# Patient Record
Sex: Male | Born: 1985 | Race: Black or African American | Hispanic: No | Marital: Single | Smoking: Former smoker
Health system: Southern US, Community
[De-identification: ages and names within clinical notes are randomized; demographics above are authoritative.]

## PROBLEM LIST (undated history)

## (undated) DIAGNOSIS — E1110 Type 2 diabetes mellitus with ketoacidosis without coma: Secondary | ICD-10-CM

## (undated) DIAGNOSIS — N179 Acute kidney failure, unspecified: Secondary | ICD-10-CM

## (undated) DIAGNOSIS — E109 Type 1 diabetes mellitus without complications: Secondary | ICD-10-CM

## (undated) HISTORY — PX: NO PAST SURGERIES: SHX2092

---

## 2009-05-22 DIAGNOSIS — N179 Acute kidney failure, unspecified: Secondary | ICD-10-CM

## 2009-05-22 HISTORY — DX: Acute kidney failure, unspecified: N17.9

## 2012-03-10 ENCOUNTER — Inpatient Hospital Stay (HOSPITAL_COMMUNITY): Payer: Self-pay

## 2012-03-10 ENCOUNTER — Encounter (HOSPITAL_COMMUNITY): Payer: Self-pay | Admitting: Nurse Practitioner

## 2012-03-10 ENCOUNTER — Inpatient Hospital Stay (HOSPITAL_COMMUNITY)
Admission: EM | Admit: 2012-03-10 | Discharge: 2012-03-22 | DRG: 638 | Disposition: A | Discharge status: Home / Self Care | Payer: Self-pay | Attending: Internal Medicine | Admitting: Internal Medicine

## 2012-03-10 DIAGNOSIS — R000 Tachycardia, unspecified: Secondary | ICD-10-CM

## 2012-03-10 DIAGNOSIS — E1159 Type 2 diabetes mellitus with other circulatory complications: Secondary | ICD-10-CM | POA: Diagnosis present

## 2012-03-10 DIAGNOSIS — E781 Pure hyperglyceridemia: Secondary | ICD-10-CM | POA: Diagnosis present

## 2012-03-10 DIAGNOSIS — I152 Hypertension secondary to endocrine disorders: Secondary | ICD-10-CM | POA: Diagnosis present

## 2012-03-10 DIAGNOSIS — R5383 Other fatigue: Secondary | ICD-10-CM

## 2012-03-10 DIAGNOSIS — G9341 Metabolic encephalopathy: Secondary | ICD-10-CM

## 2012-03-10 DIAGNOSIS — I158 Other secondary hypertension: Secondary | ICD-10-CM | POA: Diagnosis present

## 2012-03-10 DIAGNOSIS — Z79899 Other long term (current) drug therapy: Secondary | ICD-10-CM

## 2012-03-10 DIAGNOSIS — F05 Delirium due to known physiological condition: Secondary | ICD-10-CM | POA: Diagnosis present

## 2012-03-10 DIAGNOSIS — F17200 Nicotine dependence, unspecified, uncomplicated: Secondary | ICD-10-CM | POA: Diagnosis present

## 2012-03-10 DIAGNOSIS — E1310 Other specified diabetes mellitus with ketoacidosis without coma: Principal | ICD-10-CM | POA: Diagnosis present

## 2012-03-10 DIAGNOSIS — I498 Other specified cardiac arrhythmias: Secondary | ICD-10-CM | POA: Diagnosis present

## 2012-03-10 DIAGNOSIS — E1165 Type 2 diabetes mellitus with hyperglycemia: Secondary | ICD-10-CM | POA: Diagnosis present

## 2012-03-10 DIAGNOSIS — D509 Iron deficiency anemia, unspecified: Secondary | ICD-10-CM | POA: Diagnosis present

## 2012-03-10 DIAGNOSIS — E1169 Type 2 diabetes mellitus with other specified complication: Secondary | ICD-10-CM

## 2012-03-10 DIAGNOSIS — IMO0002 Reserved for concepts with insufficient information to code with codable children: Secondary | ICD-10-CM | POA: Diagnosis present

## 2012-03-10 DIAGNOSIS — N179 Acute kidney failure, unspecified: Secondary | ICD-10-CM | POA: Diagnosis present

## 2012-03-10 DIAGNOSIS — E119 Type 2 diabetes mellitus without complications: Secondary | ICD-10-CM

## 2012-03-10 DIAGNOSIS — E1110 Type 2 diabetes mellitus with ketoacidosis without coma: Secondary | ICD-10-CM | POA: Diagnosis present

## 2012-03-10 DIAGNOSIS — R609 Edema, unspecified: Secondary | ICD-10-CM | POA: Diagnosis present

## 2012-03-10 DIAGNOSIS — I10 Essential (primary) hypertension: Secondary | ICD-10-CM

## 2012-03-10 LAB — POCT I-STAT, CHEM 8
Calcium, Ion: 1.31 mmol/L — ABNORMAL HIGH (ref 1.12–1.23)
Glucose, Bld: 336 mg/dL — ABNORMAL HIGH (ref 70–99)
HCT: 50 % (ref 39.0–52.0)
Hemoglobin: 17 g/dL (ref 13.0–17.0)
TCO2: 8 mmol/L (ref 0–100)

## 2012-03-10 LAB — BASIC METABOLIC PANEL
BUN: 12 mg/dL (ref 6–23)
CO2: <7 mEq/L — CL (ref 19–32)
Calcium: 9.3 mg/dL (ref 8.4–10.5)
Calcium: 8.4 mg/dL (ref 8.4–10.5)
Creatinine, Ser: 0.9 mg/dL (ref 0.50–1.35)
Creatinine, Ser: 1.1 mg/dL (ref 0.50–1.35)
GFR calc Af Amer: >90 mL/min (ref >90)
GFR calc non Af Amer: >90 mL/min (ref >90)
Glucose, Bld: 325 mg/dL — ABNORMAL HIGH (ref 70–99)
Glucose, Bld: 431 mg/dL — ABNORMAL HIGH (ref 70–99)
Potassium: 4.5 mEq/L (ref 3.5–5.1)

## 2012-03-10 LAB — CBC
HCT: 32.1 % — ABNORMAL LOW (ref 39.0–52.0)
Hemoglobin: 11.7 g/dL — ABNORMAL LOW (ref 13.0–17.0)
MCH: 31.4 pg (ref 26.0–34.0)
MCH: 31.3 pg (ref 26.0–34.0)
MCHC: 36.4 g/dL — ABNORMAL HIGH (ref 30.0–36.0)
MCHC: 34.7 g/dL (ref 30.0–36.0)
Platelets: 214 10*3/uL (ref 150–400)
RDW: 13 % (ref 11.5–15.5)

## 2012-03-10 LAB — GLUCOSE, CAPILLARY
Glucose-Capillary: 273 mg/dL — ABNORMAL HIGH (ref 70–99)
Glucose-Capillary: 361 mg/dL — ABNORMAL HIGH (ref 70–99)

## 2012-03-10 MED ORDER — DEXTROSE-NACL 5-0.45 % IV SOLN: INTRAVENOUS | Status: DC

## 2012-03-10 MED ORDER — ENOXAPARIN SODIUM 40 MG/0.4ML ~~LOC~~ SOLN
40.0000 mg | Freq: Once | SUBCUTANEOUS | Status: DC
Start: 2012-03-10 — End: 2012-03-10

## 2012-03-10 MED ORDER — SODIUM CHLORIDE 0.9 % IV SOLN: INTRAVENOUS | Status: DC

## 2012-03-10 MED ORDER — SODIUM CHLORIDE 0.9 % IV SOLN
INTRAVENOUS | Status: DC
  Administered 2012-03-10: 21:00:00 via INTRAVENOUS

## 2012-03-10 MED ORDER — SODIUM CHLORIDE 0.9 % IV SOLN
INTRAVENOUS | Status: DC
  Administered 2012-03-10: 3.1 [IU]/h via INTRAVENOUS
  Filled 2012-03-10 (×2): qty 1

## 2012-03-10 MED ORDER — ENOXAPARIN SODIUM 40 MG/0.4ML ~~LOC~~ SOLN
40.0000 mg | SUBCUTANEOUS | Status: DC
  Administered 2012-03-10: 40 mg via SUBCUTANEOUS
  Filled 2012-03-10 (×2): qty 0.4

## 2012-03-10 MED ORDER — DEXTROSE 50 % IV SOLN: 25.0000 mL | INTRAVENOUS | Status: DC | PRN

## 2012-03-10 MED ORDER — POTASSIUM CHLORIDE 10 MEQ/100ML IV SOLN
10.0000 meq | INTRAVENOUS | Status: DC
  Administered 2012-03-10: 10 meq via INTRAVENOUS
  Filled 2012-03-10: qty 100

## 2012-03-10 MED ORDER — SODIUM CHLORIDE 0.9 % IV BOLUS (SEPSIS)
1000.0000 mL | Freq: Once | INTRAVENOUS | Status: AC
  Administered 2012-03-10: 1000 mL via INTRAVENOUS

## 2012-03-10 MED ORDER — SODIUM CHLORIDE 0.9 % IV SOLN
INTRAVENOUS | Status: DC
  Filled 2012-03-10: qty 1

## 2012-03-10 NOTE — ED Notes (Signed)
C/o feeling weakness, fatigue, headache x 3 days. States "im extremely dehydrated." pt is diabetic and does not check blood sugar at home, states he has been very thirsty

## 2012-03-10 NOTE — ED Notes (Signed)
Dr Onalee Hua contacted related to potassium d/t unclear parimeters IV K+  D/'d  60 ml infused. No new orders obtained Felipa Eth receiving RN notified.

## 2012-03-10 NOTE — ED Notes (Signed)
Attempted to call report to [facility]00 who is unable to take report at this time.

## 2012-03-10 NOTE — ED Notes (Signed)
Pt knows that urine is needed 

## 2012-03-10 NOTE — ED Notes (Signed)
Second call to 3300 Valarie RN unable to take report.

## 2012-03-10 NOTE — ED Provider Notes (Addendum)
History     CSN: [id number]  Arrival date & time 03/10/12  1213   First MD Initiated Contact with Patient 03/10/12 1727      Chief Complaint  Patient presents with  . Fatigue    (Consider location/radiation/quality/duration/timing/severity/associated sxs/prior treatment) The history is provided by the patient and a parent.   26 year old, male, with a history of diabetes.  Presents to emergency department complaining of polyuria, polydipsia, and fatigue.  He also has a headache.  He denies pain anywhere else.  He has not had vomiting, vision changes, cough, shortness of breath, or fever.  He takes metformin for his diabetes.  He denies missing any of his medications.  Level V caveat applies for urgent need for intervention  Past Medical History  Diagnosis Date  . Diabetes mellitus without complication     History reviewed. No pertinent past surgical history.  History reviewed. No pertinent family history.  History  Substance Use Topics  . Smoking status: Current Some Day Smoker  . Smokeless tobacco: Not on file  . Alcohol Use: Yes      Review of Systems  Constitutional: Negative for fever, chills and diaphoresis.  Eyes: Negative for visual disturbance.  Respiratory: Negative for cough and shortness of breath.   Cardiovascular: Negative for chest pain.  Gastrointestinal: Negative for nausea, vomiting and abdominal pain.  Genitourinary: Positive for frequency. Negative for dysuria.  Musculoskeletal: Negative for back pain.  Neurological: Positive for weakness and headaches.  Psychiatric/Behavioral: Negative for confusion.  All other systems reviewed and are negative.    Allergies  Review of patient's allergies indicates no known allergies.  Home Medications   Current Outpatient Rx  Name Route Sig Dispense Refill  . GLIPIZIDE 5 MG PO TABS Oral Take 5 mg by mouth every 3 (three) days.    Marland Kitchen METFORMIN HCL 1000 MG PO TABS Oral Take 1,000 mg by mouth daily.       BP 174/116  Pulse 107  Temp 97.9 F (36.6 C) (Oral)  Resp 22  SpO2 100%  Physical Exam  Nursing note and vitals reviewed. Constitutional: He is oriented to person, place, and time. No distress.       Listless, gaunt male, tahcypneic  HENT:  Head: Normocephalic and atraumatic.       Dry oral mucosa  Eyes: Conjunctivae normal and EOM are normal.  Neck: Normal range of motion. Neck supple.  Cardiovascular: Regular rhythm and intact distal pulses.   No murmur heard.      tachycardia  Pulmonary/Chest: Breath sounds normal. No respiratory distress. He has no rales.       tachypnea  Abdominal: Soft. Bowel sounds are normal. He exhibits no distension. There is no tenderness.  Musculoskeletal: Normal range of motion. He exhibits no edema.  Neurological: He is alert and oriented to person, place, and time. No cranial nerve deficit.  Skin: Skin is warm and dry.  Psychiatric: He has a normal mood and affect. Judgment and thought content normal.    ED Course  Procedures (including critical care time) DKA   Labs Reviewed  GLUCOSE, CAPILLARY - Abnormal; Notable for the following:    Glucose-Capillary 273 (*)     All other components within normal limits  CBC - Abnormal; Notable for the following:    WBC 15.4 (*)     RBC 3.73 (*)     Hemoglobin 11.7 (*)     HCT 32.1 (*)     MCHC 36.4 (*)  CORRECTED FOR LIPEMIA  All other components within normal limits  BASIC METABOLIC PANEL - Abnormal; Notable for the following:    Sodium 130 (*)  CORRECTED FOR LIPEMIA   Chloride 94 (*)  CORRECTED FOR LIPEMIA   CO2 <7 (*)     Glucose, Bld 325 (*)  CORRECTED FOR LIPEMIA   All other components within normal limits  POCT I-STAT, CHEM 8 - Abnormal; Notable for the following:    Sodium 133 (*)     Chloride 114 (*)     Glucose, Bld 336 (*)     Calcium, Ion 1.31 (*)     All other components within normal limits  GLUCOSE, CAPILLARY - Abnormal; Notable for the following:    Glucose-Capillary  314 (*)     All other components within normal limits  URINALYSIS, ROUTINE W REFLEX MICROSCOPIC   No results found.   No diagnosis found.  7:54 PM Spoke with Dr. Onalee Hua. She will admit to stepdown for further tx.   MDM  DKA Hyperglycemia Tachycardia         Cheri Guppy, MD 03/10/12 1759  Cheri Guppy, MD 03/10/12 [phone]

## 2012-03-10 NOTE — ED Notes (Signed)
Per mom, pt c/o ha and fatigue yesterday afternoon that progressed to next day when pt couldn't get out of bed. Pt ambulated to ED, but was really fatigued, unsteady on feet, weak, no appetite. Pt denies pain. Pt A&O x4, sob, labored breathing at rest.

## 2012-03-10 NOTE — H&P (Addendum)
Chief Complaint:  fatigue  HPI: 26 yo male niddm comes in with cc of fatigue.  Pt is very confused and per RN in ED as been confused since arrival to ED.  Pt found to be in dka with significant acidosis co2 less than 7.  Pt denies any cp, fevers, n/v/d.  Denies sob however he is tachypneic mildly and tachycardic during my visit.  He denies any h/o being on insulin at home in the past.  Denies any other h/o hiv or other chronic medical issues.  He is mildly confused during my interview and repeatively answers "type II diabetes" to multiple questions and has to be redirected.  He appears to be in no pain.  He says he has been losing weight but doesn't know how much.  Denies any symptoms except been feeling very tired.  He is from [place] and is new to the area and lives with his sister.  Review of Systems:  O/w neg but unreliable due to pt ams  Past Medical History: Past Medical History  Diagnosis Date  . Diabetes mellitus without complication    History reviewed. No pertinent past surgical history.  Medications: Prior to Admission medications   Medication Sig Start Date End Date Taking? Authorizing Provider  glipiZIDE (GLUCOTROL) 5 MG tablet Take 5 mg by mouth every 3 (three) days.   Yes Historical Provider, MD  metFORMIN (GLUCOPHAGE) 1000 MG tablet Take 1,000 mg by mouth daily.   Yes Historical Provider, MD    Allergies:  No Known Allergies  Social History:  reports that he has been smoking.  He does not have any smokeless tobacco history on file. He reports that he drinks alcohol. He reports that he does not use illicit drugs.  Family History: History reviewed. No pertinent family history.  Physical Exam: Filed Vitals:   03/10/12 1614 03/10/12 1815 03/10/12 1820 03/10/12 1847  BP: 174/116 157/115  166/110  Pulse: 107 133    Temp:   97.4 F (36.3 C)   TempSrc:   Oral   Resp: 22 26  26   SpO2: 100% 99%  100%   General appearance: alert, cooperative, mild distress and  slowed mentation mildly confused oriented to person, place not to time overall dehydrated clinically and mildly cachectic Neck: no adenopathy, no carotid bruit, no JVD, supple, symmetrical, trachea midline and thyroid not enlarged, symmetric, no tenderness/mass/nodules Lungs: clear to auscultation bilaterally Chest wall: no tenderness Heart: regular rate and rhythm, S1, S2 normal, no S3 or S4, no click and no rub tachycardic Abdomen: soft, non-tender; bowel sounds normal; no masses,  no organomegaly Extremities: extremities normal, atraumatic, no cyanosis or edema Pulses: 2+ and symmetric Skin: Skin color, texture, turgor normal. No rashes or lesions Neurologic: cn 2 thru 12 intact.  5/5 strenght bue and ble. No rashes.  Confused.    Labs on Admission:   [facility] 03/10/12 1450 03/10/12 1330  NA 133* 130*  K 4.8 4.5  CL 114* 94*  CO2 -- <7*  GLUCOSE 336* 325*  BUN 13 12  CREATININE 1.00 0.90  CALCIUM -- 9.3  MG -- --  PHOS -- --   20/13 1330  WBC -- 15.4*  NEUTROABS -- --  HGB 17.0 11.7*  HCT 50.0 32.1*  MCV -- 86.1  PLT -- 238    Radiological Exams on Admission: No results found.  Assessment/Plan Present on Admission:  26 yo male h/o niddm here with ?new onset dka .DKA (diabetic ketoacidoses) .Metabolic encephalopathy .Diabetes mellitus without complication .High  blood pressure associated with diabetes .Fatigue  No previous records.  Pt encephalopathic may be due alone to his significant acidosis but will r/o causes of dka such as checking a cxr, urinalysis to r/o infection.  Ck stat ct head.  Also ck hiv status, along with uds and etoh levels.  Ck lft's and lipase.  Place on dka pathway with insulin gtt without a bolus (glucose around 300 and probably will be sensitive to insulin) and ivf.  Ck ekg and initial troponin.  Place in stepdown.  Full code.   Dwana Garin A [phone] 03/10/2012, 8:13 PM                                                                                              12 lead ekg reviewed sinus tachycardia with no acute changes.

## 2012-03-11 ENCOUNTER — Inpatient Hospital Stay (HOSPITAL_COMMUNITY): Payer: Self-pay

## 2012-03-11 DIAGNOSIS — G9341 Metabolic encephalopathy: Secondary | ICD-10-CM

## 2012-03-11 DIAGNOSIS — E1110 Type 2 diabetes mellitus with ketoacidosis without coma: Secondary | ICD-10-CM

## 2012-03-11 LAB — BASIC METABOLIC PANEL
BUN: 19 mg/dL (ref 6–23)
BUN: 21 mg/dL (ref 6–23)
BUN: 24 mg/dL — ABNORMAL HIGH (ref 6–23)
BUN: 26 mg/dL — ABNORMAL HIGH (ref 6–23)
BUN: 28 mg/dL — ABNORMAL HIGH (ref 6–23)
BUN: 33 mg/dL — ABNORMAL HIGH (ref 6–23)
BUN: 37 mg/dL — ABNORMAL HIGH (ref 6–23)
CO2: <7 mEq/L — CL (ref 19–32)
CO2: <7 mEq/L — CL (ref 19–32)
CO2: <7 mEq/L — CL (ref 19–32)
CO2: <7 mEq/L — CL (ref 19–32)
Calcium: 8.3 mg/dL — ABNORMAL LOW (ref 8.4–10.5)
Calcium: 8.1 mg/dL — ABNORMAL LOW (ref 8.4–10.5)
Calcium: 7.6 mg/dL — ABNORMAL LOW (ref 8.4–10.5)
Calcium: 7.9 mg/dL — ABNORMAL LOW (ref 8.4–10.5)
Chloride: 108 mEq/L (ref 96–112)
Chloride: 110 mEq/L (ref 96–112)
Chloride: 111 mEq/L (ref 96–112)
Chloride: 111 mEq/L (ref 96–112)
Creatinine, Ser: 1.15 mg/dL (ref 0.50–1.35)
Creatinine, Ser: 1.09 mg/dL (ref 0.50–1.35)
Creatinine, Ser: 1.32 mg/dL (ref 0.50–1.35)
Creatinine, Ser: 1.38 mg/dL — ABNORMAL HIGH (ref 0.50–1.35)
Creatinine, Ser: 1.41 mg/dL — ABNORMAL HIGH (ref 0.50–1.35)
Creatinine, Ser: 2.11 mg/dL — ABNORMAL HIGH (ref 0.50–1.35)
Creatinine, Ser: 2.6 mg/dL — ABNORMAL HIGH (ref 0.50–1.35)
GFR calc non Af Amer: 85 mL/min — ABNORMAL LOW (ref >90)
GFR calc non Af Amer: 67 mL/min — ABNORMAL LOW (ref >90)
Glucose, Bld: 358 mg/dL — ABNORMAL HIGH (ref 70–99)
Glucose, Bld: 302 mg/dL — ABNORMAL HIGH (ref 70–99)
Glucose, Bld: 256 mg/dL — ABNORMAL HIGH (ref 70–99)
Glucose, Bld: 191 mg/dL — ABNORMAL HIGH (ref 70–99)
Glucose, Bld: 219 mg/dL — ABNORMAL HIGH (ref 70–99)
Glucose, Bld: 234 mg/dL — ABNORMAL HIGH (ref 70–99)
Glucose, Bld: 177 mg/dL — ABNORMAL HIGH (ref 70–99)
Potassium: 4.1 mEq/L (ref 3.5–5.1)
Potassium: 3.7 mEq/L (ref 3.5–5.1)
Potassium: 3.5 mEq/L (ref 3.5–5.1)

## 2012-03-11 LAB — POCT I-STAT 3, ART BLOOD GAS (G3+)
Bicarbonate: 1.9 mEq/L — ABNORMAL LOW (ref 20.0–24.0)
TCO2: <5 mmol/L (ref 0–100)
TCO2: <5 mmol/L (ref 0–100)
pCO2 arterial: 15.3 mmHg — CL (ref 35.0–45.0)
pH, Arterial: 6.845 — ABNORMAL LOW (ref 7.350–7.400)
pH, Arterial: 7.013 — CL (ref 7.350–7.450)
pO2, Arterial: 141 mmHg — ABNORMAL HIGH (ref 80.0–100.0)
pO2, Arterial: 192 mmHg — ABNORMAL HIGH (ref 80.0–100.0)

## 2012-03-11 LAB — GLUCOSE, CAPILLARY
Glucose-Capillary: 335 mg/dL — ABNORMAL HIGH (ref 70–99)
Glucose-Capillary: 274 mg/dL — ABNORMAL HIGH (ref 70–99)
Glucose-Capillary: 171 mg/dL — ABNORMAL HIGH (ref 70–99)
Glucose-Capillary: 185 mg/dL — ABNORMAL HIGH (ref 70–99)
Glucose-Capillary: 212 mg/dL — ABNORMAL HIGH (ref 70–99)
Glucose-Capillary: 211 mg/dL — ABNORMAL HIGH (ref 70–99)
Glucose-Capillary: 153 mg/dL — ABNORMAL HIGH (ref 70–99)
Glucose-Capillary: 179 mg/dL — ABNORMAL HIGH (ref 70–99)

## 2012-03-11 LAB — CBC
HCT: 31 % — ABNORMAL LOW (ref 39.0–52.0)
Hemoglobin: 10.9 g/dL — ABNORMAL LOW (ref 13.0–17.0)
MCHC: 35.2 g/dL (ref 30.0–36.0)

## 2012-03-11 LAB — URINALYSIS, ROUTINE W REFLEX MICROSCOPIC
Bilirubin Urine: NEGATIVE
Glucose, UA: >1000 mg/dL — AB
Ketones, ur: >80 mg/dL — AB
Protein, ur: 100 mg/dL — AB
Urobilinogen, UA: 0.2 mg/dL (ref 0.0–1.0)

## 2012-03-11 LAB — URINE MICROSCOPIC-ADD ON

## 2012-03-11 LAB — HIV ANTIBODY (ROUTINE TESTING W REFLEX): HIV: NONREACTIVE

## 2012-03-11 LAB — RAPID URINE DRUG SCREEN, HOSP PERFORMED: Opiates: NOT DETECTED

## 2012-03-11 LAB — MRSA PCR SCREENING: MRSA by PCR: NEGATIVE

## 2012-03-11 MED ORDER — DEXTROSE 50 % IV SOLN
25.0000 mL | INTRAVENOUS | Status: DC | PRN
  Administered 2012-03-12: 25 mL via INTRAVENOUS
  Filled 2012-03-11: qty 50

## 2012-03-11 MED ORDER — SODIUM CHLORIDE 0.9 % IV SOLN: INTRAVENOUS | Status: DC

## 2012-03-11 MED ORDER — CHLORHEXIDINE GLUCONATE 0.12 % MT SOLN
15.0000 mL | Freq: Two times a day (BID) | OROMUCOSAL | Status: DC
  Administered 2012-03-11 – 2012-03-13 (×4): 15 mL via OROMUCOSAL
  Filled 2012-03-11 (×7): qty 15

## 2012-03-11 MED ORDER — SODIUM CHLORIDE 0.9 % IV BOLUS (SEPSIS)
500.0000 mL | Freq: Once | INTRAVENOUS | Status: AC
  Administered 2012-03-11: 08:00:00 via INTRAVENOUS

## 2012-03-11 MED ORDER — SODIUM BICARBONATE 8.4 % IV SOLN
INTRAVENOUS | Status: AC
  Administered 2012-03-11: 100 meq via INTRAVENOUS
  Filled 2012-03-11: qty 100

## 2012-03-11 MED ORDER — ENOXAPARIN SODIUM 40 MG/0.4ML ~~LOC~~ SOLN
40.0000 mg | Freq: Every day | SUBCUTANEOUS | Status: DC
  Administered 2012-03-11 – 2012-03-12 (×2): 40 mg via SUBCUTANEOUS
  Filled 2012-03-11 (×3): qty 0.4

## 2012-03-11 MED ORDER — SODIUM CHLORIDE 0.9 % IV SOLN
250.0000 mg | Freq: Four times a day (QID) | INTRAVENOUS | Status: DC
  Administered 2012-03-11 – 2012-03-12 (×5): 250 mg via INTRAVENOUS
  Filled 2012-03-11 (×6): qty 250

## 2012-03-11 MED ORDER — POTASSIUM CHLORIDE 10 MEQ/100ML IV SOLN: 10.0000 meq | INTRAVENOUS | Status: AC

## 2012-03-11 MED ORDER — DEXTROSE-NACL 5-0.45 % IV SOLN
INTRAVENOUS | Status: DC
  Administered 2012-03-11 – 2012-03-12 (×2): via INTRAVENOUS

## 2012-03-11 MED ORDER — SODIUM BICARBONATE 8.4 % IV SOLN
100.0000 meq | Freq: Once | INTRAVENOUS | Status: AC
  Administered 2012-03-11: 100 meq via INTRAVENOUS

## 2012-03-11 MED ORDER — SODIUM CHLORIDE 0.9 % IV SOLN
INTRAVENOUS | Status: DC
  Administered 2012-03-11: 08:00:00 via INTRAVENOUS
  Administered 2012-03-12: 2.1 [IU]/h via INTRAVENOUS
  Administered 2012-03-14: 1.9 [IU]/h via INTRAVENOUS
  Filled 2012-03-11 (×4): qty 1

## 2012-03-11 MED ORDER — BIOTENE DRY MOUTH MT LIQD
15.0000 mL | Freq: Two times a day (BID) | OROMUCOSAL | Status: DC
  Administered 2012-03-12: 15 mL via OROMUCOSAL

## 2012-03-11 MED ORDER — POTASSIUM CHLORIDE 10 MEQ/100ML IV SOLN
10.0000 meq | INTRAVENOUS | Status: AC
  Administered 2012-03-11 – 2012-03-12 (×4): 10 meq via INTRAVENOUS
  Filled 2012-03-11 (×4): qty 100

## 2012-03-11 NOTE — Progress Notes (Signed)
ANTIBIOTIC CONSULT NOTE - INITIAL  Pharmacy Consult for Imipenem Indication:  UTI/sepsis/pancreatitis No Known Allergies  Patient Measurements: Height: 5\' 5"  (165.1 cm) Weight: 111 lb 1.8 oz (50.4 kg) IBW/kg (Calculated) : 61.5    Vital Signs: Temp: 98.3 F (36.8 C) (10/21 0738) Temp src: Axillary (10/21 0738) BP: 133/92 mmHg (10/21 0650) Pulse Rate: 130  (10/21 0650) Intake/Output from previous day: 10/20 0701 - 10/21 0700 In: 1800 [I.V.:1800] Out: 460 [Urine:460] Intake/Output from this shift: Total I/O In: 1930.5 [I.V.:1430.5; IV Piggyback:500] Out: 500 [Urine:500]  Labs:  [facility] 03/11/12 0824 03/11/12 0609 03/11/12 0425 03/11/12 0101 03/10/12 2009 03/10/12 1450 03/10/12 1330  WBC -- -- -- 49.1* 20.8* -- 15.4*  HGB -- -- -- 10.9* 12.2* 17.0 --  PLT -- -- -- 213 214 -- 238  LABCREA -- -- -- -- -- -- --  CREATININE 1.38* 1.32 1.09 -- -- -- --   Estimated Creatinine Clearance: 56.8 ml/min (by C-G formula based on Cr of 1.38). No results found for this basename: VANCOTROUGH:2,VANCOPEAK:2,VANCORANDOM:2,GENTTROUGH:2,GENTPEAK:2,GENTRANDOM:2,TOBRATROUGH:2,TOBRAPEAK:2,TOBRARND:2,AMIKACINPEAK:2,AMIKACINTROU:2,AMIKACIN:2, in the last 72 hours   Microbiology: Recent Results (from the past 720 hour(s))  MRSA PCR SCREENING     Status: Normal   Collection Time   03/10/12 11:18 PM      Component Value Range Status Comment   MRSA by PCR NEGATIVE  NEGATIVE Final     Medical History: Past Medical History  Diagnosis Date  . Diabetes mellitus without complication    Assessment: 26yom with Hx type 2 DM on metformin and glipizide PTA.  He was admitted in DKA and started on DKA protocol.  He has bacteria and leukocytes in UA, Ucx pending.  He also has abdominal pain with possible pancreatitis.  Will begin empiric antibiotics. Goal of Therapy:  Infection resolution  Plan:  Imipenem 250mg  IV q6  Marcelino Scot 03/11/2012,10:40 AM

## 2012-03-11 NOTE — Progress Notes (Signed)
Inpatient Diabetes Program Recommendations  AACE/ADA: New Consensus Statement on Inpatient Glycemic Control (2013)  Target Ranges:  Prepandial:   less than 140 mg/dL      Peak postprandial:   less than 180 mg/dL (1-2 hours)      Critically ill patients:  140 - 180 mg/dL   Inpatient Diabetes Program Recommendations HgbA1C: Please order A1C.  Note: According to notes in chart, pt has DM, type 2.  Please order an A1C.  Will continue to follow. Thanks, Orlando Penner, RN, BSN, CCRN Diabetes Coordinator Inpatient Diabetes Program [phone]

## 2012-03-11 NOTE — Significant Event (Signed)
CRITICAL VALUE ALERT  Critical value received: CO2 <7  Date of notification: 03/11/12  Time of notification:1153  Critical value read back: Yes  Nurse who received alert: Ardith Dark, RN  MD notified: Dr Jonni Sanger on unit  Time of first page: 1153   Responding MD: Dr. Delford Field  Time MD responded:1153

## 2012-03-11 NOTE — Progress Notes (Signed)
Pt to TX to ICU, RR avg 30s-40s, very lethargic, barely responsive. NS RN calling report.

## 2012-03-11 NOTE — Progress Notes (Signed)
UR Completed.  Vangie Bicker [phone] 03/11/2012

## 2012-03-11 NOTE — Progress Notes (Signed)
Report called and pt transferred by day-shift to 2112. Will visit upon end of shift to confirm no additional information needed.

## 2012-03-11 NOTE — Progress Notes (Signed)
PCCM agrees pt should be transferred to 2100 for closer evaluation. Report called: receiving RN currently unavailable.

## 2012-03-11 NOTE — Consult Note (Signed)
Name: [name] [name] MRN: [id number] DOB: Oct 21, 1985    LOS: 1  Referring Provider:  [facility] Reason for Referral:  DKA/AMS  PULMONARY / CRITICAL CARE MEDICINE  HPI:  26 yo diabetic -typoe 2 on OHAs   with DKA , admitted 10/20 pm  with profound acidosis and placed on DKA protocol. PCCM called at 0645 to evaluate for poor mental status -head CT neg & extreme acidosis. UDS neg  Will tx to ICU x 24 hrs to stabilize.  Past Medical History  Diagnosis Date  . Diabetes mellitus without complication    History reviewed. No pertinent past surgical history. Prior to Admission medications   Medication Sig Start Date End Date Taking? Authorizing Provider  glipiZIDE (GLUCOTROL) 5 MG tablet Take 5 mg by mouth every 3 (three) days.   Yes Historical Provider, MD  metFORMIN (GLUCOPHAGE) 1000 MG tablet Take 1,000 mg by mouth daily.   Yes Historical Provider, MD   Allergies No Known Allergies  Family History History reviewed. No pertinent family history. Social History  reports that he has been smoking.  He does not have any smokeless tobacco history on file. He reports that he drinks alcohol. He reports that he does not use illicit drugs.  Review Of Systems: unable since poorly responsive    Events Since Admission: 10-21 tx to icu  Current Status: Stable, tachypneic, follows commands, denies pain  Vital Signs: Temp:  [97.4 F (36.3 C)-98.2 F (36.8 C)] 98.2 F (36.8 C) (10/21 0306) Pulse Rate:  [91-143] 130  (10/21 0650) Resp:  [16-33] 25  (10/21 0650) BP: (130-174)/(82-123) 133/92 mmHg (10/21 0650) SpO2:  [95 %-100 %] 100 % (10/21 0650) Weight:  [50.4 kg (111 lb 1.8 oz)] 50.4 kg (111 lb 1.8 oz) (10/20 2310) Ct Head Wo Contrast  03/10/2012  *RADIOLOGY REPORT*  Clinical Data: Headache, weakness, and fatigue.  CT HEAD WITHOUT  CONTRAST  Technique:  Contiguous axial images were obtained from the base of the skull through the vertex without contrast.  Comparison: None.  Findings: There is no acute intracranial hemorrhage, infarction, or mass lesion.  Brain parenchyma appears normal.  There is slight mucosal thickening in the frontal and ethmoid air cells, probably chronic.  IMPRESSION: No acute intracranial abnormality.  Probable chronic changes in the paranasal sinuses.   Original Report Authenticated By: Gwynn Burly, M.D.    Physical Examination: General:  Thin AAM Neuro:  Responds to voice but lethargic, follows 1 step commands HEENT:  No LAN Neck:  No JVD Cardiovascular:  HSR ST Lungs:  CTA  Kussmaul resp  Abdomen:  Soft, non tender Musculoskeletal:  intact Skin:  warm  Principal Problem:  *DKA (diabetic ketoacidoses) Active Problems:  Diabetes mellitus without complication  Metabolic encephalopathy  High blood pressure associated with diabetes  Fatigue   ASSESSMENT AND PLAN   ENDOCRINE  Lab 03/11/12 0621 03/11/12 0529 03/11/12 0429 03/11/12 0326 03/11/12 0221 GLUCAP 253* 265* 270* 274* 305*  A:  DKA/profound metabolic acidosis P:   -per protocol -CBG 171 now on insulin drip, add dextrose to IVFs -Give 500 bolus & keep fluids at 250/h  PULMONARY  Lab 03/11/12 0621  PHART 7.013*  PCO2ART 15.3*  PO2ART 192.0*  HCO3 3.9*  O2SAT 99.0   Ventilator Settings:    ETT:    A:  Tachypnea related to acidosis P:     CARDIOVASCULAR No results found for this basename: TROPONINI:5,LATICACIDVEN:5, O2SATVEN:5,PROBNP:5 in the last 168 hours ECG:   Lines:   A: No acute issue P:    RENAL  Lab 03/11/12 0425 03/11/12 0100 03/10/12 2153 03/10/12 1450 03/10/12 1330  NA 134* 134* 132* 133* 130*  K 4.1 4.7 -- -- --  CL 108 106 102 114* 94*  CO2 <7* <7* <7* -- <7*  BUN 21 19 18 13 12   CREATININE 1.09 1.15 1.10 1.00 0.90  CALCIUM 8.2* 8.3* 8.4 -- 9.3  MG -- -- -- -- --  PHOS -- -- -- --  --   Intake/Output      10/20 0701 - 10/21 0700 10/21 0701 - 10/22 0700   I.V. (mL/kg) 1800 (35.7)    Total Intake(mL/kg) 1800 (35.7)    Urine (mL/kg/hr) 400 (0.3)    Total Output 400    Net +1400          Foley:    A:  No acute issue P:     GASTROINTESTINAL No results found for this basename: AST:5,ALT:5,ALKPHOS:5,BILITOT:5,PROT:5,ALBUMIN:5 in the last 168 hours  A:  ABD distension P:   -flat plate of abd to ro secondary source of acidosis  HEMATOLOGIC  Lab 03/11/12 0101 03/10/12 2009 03/10/12 1450 03/10/12 1330  HGB 10.9* 12.2* 17.0 11.7*  HCT 31.0* 35.2* 50.0 32.1*  PLT 213 214 -- 238  INR -- -- -- --  APTT -- -- -- --   A:  No acute issue P:    INFECTIOUS  Lab 03/11/12 0101 03/10/12 2009 03/10/12 1330  WBC 49.1* 20.8* 15.4*  PROCALCITON -- -- --   Cultures:  Antibiotics:   A:  No obvious source of infection P:  Procalcitonin protocol, if high, start empiric ABX    NEUROLOGIC  A: AMS -improving P:   - neg ct of head  BEST PRACTICE / DISPOSITION Level of Care:  icu Primary Service:  [facility] to PCCM in ICU Consultants:  pccm Code Status:  full Diet:  npo DVT Px:  lmwh GI Px:  ppi Skin Integrity:  intact Social / Family:  none   Care during the described time interval was provided by me and/or other providers on the critical care team.  I have reviewed this patient's available data, including medical history, events of note, physical examination and test results as part of my evaluation  CC time x  45 m  Cyril Mourning MD. Tonny Bollman. [facility] Pager [phone] If no response call [phone]

## 2012-03-11 NOTE — Progress Notes (Signed)
Due to pt's obtunded state (peaking at around 0600), HR in 140s sustained (as high as 160s), RR in 30s (Kussmaul) critical ABG values, and in-general poor appearance, Attending and Rapid Response paged. Attending NP and Rapid Response Nurse currently at bedside assessing pt and possible need to transfer to ICU. Currently awaiting PCCM to come to beside and assess.

## 2012-03-11 NOTE — Progress Notes (Signed)
Called around [phone] for patient with increased HR and increased lethargy. Upon assessment patient is not oriented and lethargic, HR 140s, BP 13692, RR 33, 99% RA. ABG ph 7.013, CO2 15.3, O2 192 bicarb 3.9. Patient had been agitated earlier to the point of climbing out of the bed, but is now only able to answer simple questions. Admitting NP notified, orders received for 100 meq bicarb given. CCM NP at bedside, orders received to transfer to ICU. Will continue to monitor.

## 2012-03-11 NOTE — Progress Notes (Signed)
Notified Attending ([facility]) On-Call NP regarding pt's sustained HR 120s-140s. Also mentioned to Attending pt's confused and agitated state along with recurrent efforts to dislodge lines and get out of bed. EKG and restraint orders received. EKG not indicative of any acute change. Will continue to monitor.

## 2012-03-12 ENCOUNTER — Inpatient Hospital Stay (HOSPITAL_COMMUNITY): Payer: MEDICAID

## 2012-03-12 DIAGNOSIS — G9341 Metabolic encephalopathy: Secondary | ICD-10-CM

## 2012-03-12 DIAGNOSIS — E1110 Type 2 diabetes mellitus with ketoacidosis without coma: Secondary | ICD-10-CM

## 2012-03-12 LAB — BASIC METABOLIC PANEL
BUN: 38 mg/dL — ABNORMAL HIGH (ref 6–23)
CO2: <7 mEq/L — CL (ref 19–32)
Calcium: 8.6 mg/dL (ref 8.4–10.5)
Chloride: 113 mEq/L — ABNORMAL HIGH (ref 96–112)
Chloride: 113 mEq/L — ABNORMAL HIGH (ref 96–112)
Creatinine, Ser: 3.12 mg/dL — ABNORMAL HIGH (ref 0.50–1.35)
GFR calc Af Amer: 23 mL/min — ABNORMAL LOW (ref >90)
GFR calc Af Amer: 20 mL/min — ABNORMAL LOW (ref >90)
GFR calc Af Amer: 17 mL/min — ABNORMAL LOW (ref >90)
GFR calc non Af Amer: 20 mL/min — ABNORMAL LOW (ref >90)
GFR calc non Af Amer: 18 mL/min — ABNORMAL LOW (ref >90)
Glucose, Bld: 197 mg/dL — ABNORMAL HIGH (ref 70–99)
Glucose, Bld: 180 mg/dL — ABNORMAL HIGH (ref 70–99)
Potassium: 3.4 mEq/L — ABNORMAL LOW (ref 3.5–5.1)
Potassium: 3.3 mEq/L — ABNORMAL LOW (ref 3.5–5.1)
Potassium: 3.9 mEq/L (ref 3.5–5.1)
Sodium: 141 mEq/L (ref 135–145)
Sodium: 136 mEq/L (ref 135–145)
Sodium: 137 mEq/L (ref 135–145)

## 2012-03-12 LAB — LIPASE, BLOOD: Lipase: 204 U/L — ABNORMAL HIGH (ref 11–59)

## 2012-03-12 LAB — URINE CULTURE: Colony Count: 25000

## 2012-03-12 LAB — COMPREHENSIVE METABOLIC PANEL
ALT: 35 U/L (ref 0–53)
Alkaline Phosphatase: 77 U/L (ref 39–117)
BUN: 41 mg/dL — ABNORMAL HIGH (ref 6–23)
CO2: 8 mEq/L — CL (ref 19–32)
Calcium: 8.4 mg/dL (ref 8.4–10.5)
GFR calc Af Amer: 26 mL/min — ABNORMAL LOW (ref >90)
GFR calc non Af Amer: 22 mL/min — ABNORMAL LOW (ref >90)
Glucose, Bld: 126 mg/dL — ABNORMAL HIGH (ref 70–99)
Sodium: 140 mEq/L (ref 135–145)

## 2012-03-12 LAB — GLUCOSE, CAPILLARY
Glucose-Capillary: 169 mg/dL — ABNORMAL HIGH (ref 70–99)
Glucose-Capillary: 148 mg/dL — ABNORMAL HIGH (ref 70–99)
Glucose-Capillary: 171 mg/dL — ABNORMAL HIGH (ref 70–99)

## 2012-03-12 LAB — HEMOGLOBIN A1C
Hgb A1c MFr Bld: 13.9 % — ABNORMAL HIGH (ref <5.7)
Mean Plasma Glucose: 352 mg/dL — ABNORMAL HIGH (ref <117)

## 2012-03-12 LAB — CBC
Hemoglobin: 8.7 g/dL — ABNORMAL LOW (ref 13.0–17.0)
MCHC: 36.6 g/dL — ABNORMAL HIGH (ref 30.0–36.0)
RDW: 14.2 % (ref 11.5–15.5)

## 2012-03-12 MED ORDER — WHITE PETROLATUM GEL
Status: AC
  Administered 2012-03-12: 0.2
  Filled 2012-03-12: qty 5

## 2012-03-12 MED ORDER — LIVING WELL WITH DIABETES BOOK
Freq: Once | Status: AC
  Administered 2012-03-12: 18:00:00
  Filled 2012-03-12: qty 1

## 2012-03-12 MED ORDER — PNEUMOCOCCAL VAC POLYVALENT 25 MCG/0.5ML IJ INJ
0.5000 mL | INJECTION | INTRAMUSCULAR | Status: AC
  Filled 2012-03-12: qty 0.5

## 2012-03-12 MED ORDER — POTASSIUM CHLORIDE 10 MEQ/100ML IV SOLN
INTRAVENOUS | Status: AC
  Administered 2012-03-12: 10 meq via INTRAVENOUS
  Filled 2012-03-12: qty 100

## 2012-03-12 MED ORDER — INFLUENZA VIRUS VACC SPLIT PF IM SUSP
0.5000 mL | INTRAMUSCULAR | Status: AC
  Filled 2012-03-12: qty 0.5

## 2012-03-12 MED ORDER — ENOXAPARIN SODIUM 30 MG/0.3ML ~~LOC~~ SOLN
30.0000 mg | Freq: Every day | SUBCUTANEOUS | Status: DC
  Administered 2012-03-16 – 2012-03-22 (×6): 30 mg via SUBCUTANEOUS
  Filled 2012-03-12 (×10): qty 0.3

## 2012-03-12 MED ORDER — POTASSIUM CHLORIDE 10 MEQ/100ML IV SOLN
10.0000 meq | INTRAVENOUS | Status: AC
  Administered 2012-03-12 (×4): 10 meq via INTRAVENOUS
  Filled 2012-03-12: qty 200
  Filled 2012-03-12: qty 100

## 2012-03-12 NOTE — Progress Notes (Signed)
Inpatient Diabetes Program Recommendations  AACE/ADA: New Consensus Statement on Inpatient Glycemic Control (2013)  Target Ranges:  Prepandial:   less than 140 mg/dL      Peak postprandial:   less than 180 mg/dL (1-2 hours)      Critically ill patients:  140 - 180 mg/dL     Note: Went to talk with the patient about history of diabetes and medication regimen at home.  However, when I speak to the patient he will open his eyes and does not engage in conversation with me.  Spoke with the patient's nurse and she states that patient has been this way since being admitted and that his parents can provide more information.  Will go back to patient's room this evening and see if his parents are there.    Thanks, Orlando Penner, RN, BSN, CCRN Diabetes Coordinator Inpatient Diabetes Program [phone]

## 2012-03-12 NOTE — Progress Notes (Signed)
Hypoglycemic Event  CBG: 66  Treatment: 1/2 amp Dextrose  Symptoms: lethargic  Follow-up CBG: Time:0326 CBG Result:148  Possible Reasons for Event: insulin gtt  Comments/MD notified:d50 already ordered per DKA protocol    Nelson Chimes  Remember to initiate Hypoglycemia Order Set & complete

## 2012-03-12 NOTE — Progress Notes (Signed)
Name: [name] [name] MRN: [id number] DOB: [birth year]    LOS: 2  Referring Provider:  [facility] Reason for Referral:  DKA/AMS  PULMONARY / CRITICAL CARE MEDICINE  HPI:  26 yo diabetic -type 2 on OHAs with DKA, admitted 10/20 pm  with profound acidosis and placed on DKA protocol. PCCM called at 0645 to evaluate for poor mental status -head CT neg & extreme acidosis. UDS neg  Will tx to ICU x 24 hrs to stabilize.   Events Since Admission: 10/21- tx to icu 10/21 - NGT out  Current Status: Stable, tachypneic, follows commands, endorses nausea and abd pain  Vital Signs: Temp:  [97.4 F (36.3 C)-100.3 F (37.9 C)] 98.8 F (37.1 C) (10/22 0420) Pulse Rate:  [28-137] 110  (10/22 0600) Resp:  [18-42] 19  (10/22 0600) BP: (119-146)/(72-95) 119/80 mmHg (10/22 0600) SpO2:  [88 %-100 %] 99 % (10/22 0600) Weight:  [109 lb 5.6 oz (49.6 kg)] 109 lb 5.6 oz (49.6 kg) (10/22 0500) Ct Head Wo Contrast  03/10/2012  *RADIOLOGY REPORT*  Clinical Data: Headache, weakness, and fatigue.  CT HEAD WITHOUT CONTRAST  Technique:  Contiguous axial images were obtained from the base of the skull through the vertex without contrast.  Comparison: None.  Findings: There is no acute intracranial hemorrhage, infarction, or mass lesion.  Brain parenchyma appears normal.  There is slight mucosal thickening in the frontal and ethmoid air cells, probably chronic.  IMPRESSION: No acute intracranial abnormality.  Probable chronic changes in the paranasal sinuses.   Original Report Authenticated By: Gwynn Burly, M.D.    Dg Chest Port 1 View  03/11/2012  *RADIOLOGY REPORT*  Clinical Data:   Diabetic ketoacidosis.  PORTABLE CHEST - 1 VIEW  Comparison: None  Findings: The lungs are clear.  No edema, infiltrate or pleural fluid is identified.  Heart size and mediastinal contours are within normal limits.  No bony abnormalities are identified.  IMPRESSION: No active disease.   Original Report Authenticated By: Reola Calkins,  M.D.    Dg Abd Portable 1v  03/11/2012  *RADIOLOGY REPORT*  Clinical Data: Abdominal distention and weakness.  PORTABLE ABDOMEN - 1 VIEW  Comparison: None.  Findings: The stomach is markedly distended with air.  A fair amount of stool is seen in the colon.  No definite small bowel dilatation.  IMPRESSION:  1.  Stomach is markedly distended with air. 2.  Question constipation.   Original Report Authenticated By: Reyes Ivan, M.D.    Physical Examination: General:  Thin AAM. Sleeping in bed Neuro:  Responds to voice but lethargic, follows commands HEENT:  No LAN Neck:  No JVD Cardiovascular: tachycardic Lungs:  CTA  Kussmaul resp  Abdomen:  Soft, tender to palpation diffusely Musculoskeletal:  intact Skin:  warm  Principal Problem:  *DKA (diabetic ketoacidoses) Active Problems:  Diabetes mellitus without complication  Metabolic encephalopathy  High blood pressure associated with diabetes  Fatigue   ASSESSMENT AND PLAN   ENDOCRINE  A:  DKA/profound metabolic acidosis P:   -per protocol -CBG 98-213 now on insulin drip,on D 51/2NS fluids -CO2 is 8 this AM (previously <7). Continue to monitor; continue insulin until CO2 improves -Unclear if another etiology of metabolic acidosis is contributing given persistently low CO2 on insulin. (?Uremia vs. Ingestion?)   PULMONARY  Lab 03/11/12 0621 03/10/12 0836  PHART 7.013* 6.845*  PCO2ART 15.3* 11.2*  PO2ART 192.0* 141.0*  HCO3 3.9* 1.9*  O2SAT 99.0 96.0     A:  Tachypnea related to acidosis  P:   - On room air, protecting airway   CARDIOVASCULAR  Lab 03/11/12 1030  TROPONINI --  LATICACIDVEN 2.0  PROBNP --   ECG:  Sinus tachy Lines: Peripheral  A: No acute issue P:  - Monitor on telemetry   RENAL  Lab 03/12/12 0445 03/12/12 0002 03/11/12 2005 03/11/12 1604 03/11/12 1023  NA 140 141 141 140 142  K 3.8 3.4* -- -- --  CL 114* 110 111 111 112  CO2 8* <7* <7* <7* <7*  BUN 41* 38* 37* 33* 28*  CREATININE  3.47* 3.12* 2.60* 2.11* 1.41*  CALCIUM 8.4 8.6 8.3* 8.1* 7.9*  MG -- -- -- -- --  PHOS -- -- -- -- --   Intake/Output      10/21 0701 - 10/22 0700 10/22 0701 - 10/23 0700   I.V. (mL/kg) 2996.5 (60.4)    IV Piggyback 1300    Total Intake(mL/kg) 4296.5 (86.6)    Urine (mL/kg/hr) 1981 (1.7)    Emesis/NG output 560    Total Output 2541    Net +1755.5          Foley:    A:  ARF P:   - Creat continues to trend up - Continue IVF - Monitor UOP, 1.9L in last 24 hours   GASTROINTESTINAL  Lab 03/12/12 0445  AST 79*  ALT 35  ALKPHOS 77  BILITOT 0.2*  PROT 6.1  ALBUMIN 2.4*   KUB- Stomach distended with air  A:  ABD distension; ?pancreatitis P:   - Lipase elevated to 204, Amylase slightly elevated. Triglycerides >5000 - NGT out yesterday, continues to have nausea. Replace if needed - Ice chips only - Pain control - Abdominal ultrasound now to evaluate liver, pancreas and kidneys   HEMATOLOGIC  Lab 03/12/12 0445 03/11/12 0101 03/10/12 2009 03/10/12 1450 03/10/12 1330  HGB 8.7* 10.9* 12.2* 17.0 11.7*  HCT 23.8* 31.0* 35.2* 50.0 32.1*  PLT 148* 213 214 -- 238  INR -- -- -- -- --  APTT -- -- -- -- --   A:  Anemia. No signs of bleeding P:  - F/u AM cbc - Transfuse only if <7  INFECTIOUS  Lab 03/12/12 0445 03/11/12 0823 03/11/12 0101 03/10/12 2009 03/10/12 1330  WBC 13.5* -- 49.1* 20.8* 15.4*  PROCALCITON -- 4.21 -- -- --   Cultures: Urine Cx 10/21 >>  Antibiotics: Primaxin 10/21>>  A:  No obvious source of infection P:  - Continue Primaxin until ultrasound complete. Consider d/c if u/s wnl   NEUROLOGIC  A: AMS -improving P:   - neg ct of head   BEST PRACTICE / DISPOSITION Level of Care:  ICU Primary Service:  [facility] to PCCM in ICU Consultants:  pccm Code Status:  full Diet:  npo DVT Px:  Lovenox GI Px:  ppi Skin Integrity:  intact Social / Family:  none  Amber M. Hairford, M.D. 03/12/2012 7:34 AM  26 yo with recent dx of DM.  Likely type  1 given polyuria, polydipsia, and weight loss 2nd to acute pancreatitis with hypertriglyceridemia.  Will need to continue insulin and IV fluid.  Abx added for possible infection>>clinical suspicion is low.  If abd u/s unremarkable, then can likely d/c Abx soon.  Will need assistance with arrangements for financial aid.  I have independently reviewed chart, examined pt, and formulated assessment/plan in discussion with residents.  Updated family at bedside.  Critical care time 35 minutes.  Coralyn Helling, MD [facility] Pulmonary/Critical Care 03/12/2012, 2:08 PM Pager:  [phone]  After 3pm call: [phone]

## 2012-03-12 NOTE — Progress Notes (Signed)
Inpatient Diabetes Program Recommendations  AACE/ADA: New Consensus Statement on Inpatient Glycemic Control (2013)  Target Ranges:  Prepandial:   less than 140 mg/dL      Peak postprandial:   less than 180 mg/dL (1-2 hours)      Critically ill patients:  140 - 180 mg/dL     Inpatient Diabetes Program Recommendations HgbA1C: A1C is 13.9%.  Note:  Returned to patient's room to talk with patient and his mother and stepfather.  Patient again opened his eyes when I spoke to him and then shut his eyes back and was not engaged in conversation.  Talked to patient's mother and stepfather.  According the the family, patient was diagnosed with diabetes one month ago at [facility] and was given enough glipizide and Glucophage to last about a week.  Informed the patient and his family that both medications prescribed can be purchased at [facility] for $4 each.  Family states that patient did take the medication but once he ran out he did not have any money to purchase the medications and therefore has not been taking anything for his diabetes since then.  Family also reports that patient continues to eat poor food choices (candies, cookies, and chips).  Talked with the family about blood sugar control and diet modifications.  Will order Living well with diabetes book and request dietician consult.  Plan to visit with the patient again and get patient involved in education. Thanks, Orlando Penner, RN, BSN, CCRN Diabetes Coordinator Inpatient Diabetes Program [phone]

## 2012-03-12 NOTE — Progress Notes (Signed)
INITIAL ADULT NUTRITION ASSESSMENT Date: 03/12/2012   Time: 3:19 PM  Reason for Assessment: (MST=2)  INTERVENTION:  Follow-up for diabetes education at a more appropriate time.  Also recommend OP diabetes diet education.  Recommend start TF via enteral feeding tube placed into the jejunum if unable to begin PO diet in the next 24-48 hours.  DOCUMENTATION CODES Per approved criteria  -Underweight -Severe malnutrition in the context of acute illness   ASSESSMENT: Male 26 y.o.  Dx: DKA (diabetic ketoacidoses)  Hx:  Past Medical History  Diagnosis Date  . Diabetes mellitus without complication     History reviewed. No pertinent past surgical history.  Related Meds:  Scheduled Meds:   . antiseptic oral rinse  15 mL Mouth Rinse q12n4p  . chlorhexidine  15 mL Mouth Rinse BID  . enoxaparin  30 mg Subcutaneous Daily  . influenza  inactive virus vaccine  0.5 mL Intramuscular Tomorrow-1000  . pneumococcal 23 valent vaccine  0.5 mL Intramuscular Tomorrow-1000  . potassium chloride  10 mEq Intravenous Q1 Hr x 4  . potassium chloride  10 mEq Intravenous Q1 Hr x 4  . white petrolatum      . DISCONTD: enoxaparin  40 mg Subcutaneous Daily  . DISCONTD: imipenem-cilastatin  250 mg Intravenous Q6H   Continuous Infusions:   . dextrose 5 % and 0.45% NaCl Stopped (03/12/12 1458)  . insulin (NOVOLIN-R) infusion 3.6 Units/hr (03/12/12 1400)   PRN Meds:.dextrose   Ht: 5\' 5"  (165.1 cm)  Wt: 109 lb 5.6 oz (49.6 kg)  Ideal Wt: 61.8 kg % Ideal Wt: 80%  Usual Wt: 118 lb (~4 weeks ago per patient) % Usual Wt: 92%  Body mass index is 18.20 kg/(m^2).  Underweight  Food/Nutrition Related Hx: 8% weight loss in the past 4 weeks; recent poor PO intake.  Does not follow a specific diet at home.  Labs:  CMP     Component Value Date/Time   NA 137 03/12/2012 1221   K 3.3* 03/12/2012 1221   CL 113* 03/12/2012 1221   CO2 11* 03/12/2012 1221   GLUCOSE 180* 03/12/2012 1221   BUN 44*  03/12/2012 1221   CREATININE 4.23* 03/12/2012 1221   CALCIUM 8.2* 03/12/2012 1221   PROT 6.1 03/12/2012 0445   ALBUMIN 2.4* 03/12/2012 0445   AST 79* 03/12/2012 0445   ALT 35 03/12/2012 0445   ALKPHOS 77 03/12/2012 0445   BILITOT 0.2* 03/12/2012 0445   GFRNONAA 18* 03/12/2012 1221   GFRAA 20* 03/12/2012 1221    CBG (last 3)   Basename 03/12/12 1240 03/12/12 0524 03/12/12 0427  GLUCAP 171* 146* 118*    Sodium  Date/Time Value Range Status  03/12/2012 12:21 PM 137  135 - 145 mEq/L Final     POST-ULTRACENTRIFUGATION  03/12/2012  8:46 AM 136  135 - 145 mEq/L Final     POST-ULTRACENTRIFUGATION  03/12/2012  4:45 AM 140  135 - 145 mEq/L Final     POST-ULTRACENTRIFUGATION    Potassium  Date/Time Value Range Status  03/12/2012 12:21 PM 3.3* 3.5 - 5.1 mEq/L Final     POST-ULTRACENTRIFUGATION  03/12/2012  8:46 AM 3.4* 3.5 - 5.1 mEq/L Final     POST-ULTRACENTRIFUGATION  03/12/2012  4:45 AM 3.8  3.5 - 5.1 mEq/L Final     Intake/Output Summary (Last 24 hours) at 03/12/12 1521 Last data filed at 03/12/12 1500  Gross per 24 hour  Intake 2726.98 ml  Output   1865 ml  Net 861.98 ml    Diet  Order: NPO  IVF:    dextrose 5 % and 0.45% NaCl Last Rate: Stopped (03/12/12 1458)  insulin (NOVOLIN-R) infusion Last Rate: 3.6 Units/hr (03/12/12 1400)    Estimated Nutritional Needs:   Kcal: 1800-2000 Protein: 90-110 gm Fluid: 2 liters  Patient reports recent poor intake due to not feeling well for a few days PTA.  Usually weighs around 118 lb.  Per MD notes and discussion with RN, patient suspected to have pancreatitis.    Pt meets criteria for severe malnutrition in the context of acute illness as evidenced by 8% weight loss in the past month and intake < 50% of estimated energy requirement for > 5 days.  Patient requested handouts on diabetes diet.  RD provided "Carbohydrate counting for people with diabetes" handout to patient.  Further education still needed, but patient is too  lethargic for education at this time.  NUTRITION DIAGNOSIS: -Inadequate oral intake (NI-2.1).  Status: Ongoing  RELATED TO: altered GI function  AS EVIDENCED BY: NPO status  MONITORING/EVALUATION(Goals): Goal:  Intake to meet >90% of estimated nutrition needs. Monitor:  Diet advancement, PO intake, weight trend, labs.  EDUCATION NEEDS: -Education needs addressed.  Provided CHO-Counting handout for patient to review.   Joaquin Courts, RD, LDN, CNSC Pager# [phone] After Hours Pager# [phone]  03/12/2012, 3:19 PM

## 2012-03-12 NOTE — Care Management Note (Signed)
Page 1 of 2   03/23/2012     6:07:39 PM   CARE MANAGEMENT NOTE 03/23/2012  Patient:  [name] [name], [name] [name]   Account Number:  [id number]  Date Initiated:  03/11/2012  Documentation initiated by:  Avie Arenas  Subjective/Objective Assessment:   DKA  Lives with sister.     Action/Plan:   Anticipated DC Date:  03/15/2012   Anticipated DC Plan:  HOME W HOME HEALTH SERVICES      DC Planning Services  CM consult  Medication Assistance      Choice offered to / List presented to:             Status of service:  Completed, signed off Medicare Important Message given?   (If response is "NO", the following Medicare IM given date fields will be blank) Date Medicare IM given:   Date Additional Medicare IM given:    Discharge Disposition:  HOME/SELF CARE  Per UR Regulation:  Reviewed for med. necessity/level of care/duration of stay  If discussed at Long Length of Stay Meetings, dates discussed:   03/19/2012  03/20/2012    Comments:  03/23/2012 1130 Mother brought pt's Rx back to utilize ZZ fund. Will provided vial of insulin with ZZ fund. Lasix is a $4.00 med at [facility] on the generic discount plan. Isidoro Donning RN CCM Case Mgmt phone [phone]  Contact: [name] [name] Mother [phone]  03/22/2012  [address] RN, [place]  [phone] call from patient's mother, she is at [facility] pharmacy to get meds and can't afford them. Patient was discharged without ZZ fund.  Per pharmacy she can bring the script back tomorrow and get it filled.  Advised mother to return tomorrow to fill the script.  03/22/2012  [address] RN, [place]  [phone] Met with patient regarding discharge plans regarding MD follow up and glucometer. He gave the clinic list to his mom and someone called him about getting the free glucometer.  The catheter was removed today.  CM to continue to follow for discharge medications for ZZ fund.  03/20/2012 1500 Darlyne Russian RN, [place]  [phone] Met with patient regarding  discharge planning. Medications he currently gets at [facility]. He does not have a meter to monitor blood sugar. He does not have a PCP. Discussed options for clinic follow up and co payments, he may be able to afford, will have to talk with mother. Explained zz fund for medications.  CM will call mother.  Call to Mother [name] [name] [phone] reviewed the discharge planning, eligible for meds from pharmacy at discharge, reviewed listing of clinics for post discharge follow up. CM will leave information with patient, for the clinics and phone number to call for free glucose monitor.   03/18/2012  1500 Darlyne Russian RN, [place]  [phone] Financial Counselor: Arline Asp  [phone] they spoke with patient  03-13-12 10:30am Avie Arenas, RNBSN [phone] Patient sitting up in chair - awake and alert.  Continues on insulin drip - added bicarb drip.  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  Step father is a diabetic and will be giving patient one ofhis monitors and strips.  Will need PCP - have asked physician to referal to internal medicine or family practice clinic here at [facility]. Recently diagnosed and on po meds - both 4 dollar meds. Will probably need insulin on discharge with med assistance.  Diabetes Teaching service on case and working with  patient.    Planning to tx off unit today to intermediate care unit.  03-12-12 1:40pm Avie Arenas, RNBSN [phone] Lost job in [place] along with insurance.  Moved to [place] to live with sister.  Mother states recently diagnosed with DM.  Does not have anything.  Placed on po medications. Will need PCP, med assist and glucose monitor.  Patient with blank stare -  not responding to questions. unable to have coversation.  Diabetes Teaching service notified of need.  Talked with Beryl Meager - states will be following her.

## 2012-03-13 LAB — IRON AND TIBC
Iron: 53 ug/dL (ref 42–135)
Saturation Ratios: 34 % (ref 20–55)
TIBC: 155 ug/dL — ABNORMAL LOW (ref 215–435)
UIBC: 102 ug/dL — ABNORMAL LOW (ref 125–400)

## 2012-03-13 LAB — BASIC METABOLIC PANEL
BUN: 57 mg/dL — ABNORMAL HIGH (ref 6–23)
BUN: 58 mg/dL — ABNORMAL HIGH (ref 6–23)
CO2: 9 mEq/L — CL (ref 19–32)
CO2: 10 mEq/L — CL (ref 19–32)
CO2: 9 mEq/L — CL (ref 19–32)
CO2: 10 mEq/L — CL (ref 19–32)
Chloride: 112 mEq/L (ref 96–112)
Chloride: 112 mEq/L (ref 96–112)
Chloride: 109 mEq/L (ref 96–112)
Chloride: 109 mEq/L (ref 96–112)
Creatinine, Ser: 5.72 mg/dL — ABNORMAL HIGH (ref 0.50–1.35)
GFR calc Af Amer: 13 mL/min — ABNORMAL LOW (ref >90)
Glucose, Bld: 147 mg/dL — ABNORMAL HIGH (ref 70–99)
Glucose, Bld: 167 mg/dL — ABNORMAL HIGH (ref 70–99)
Glucose, Bld: 179 mg/dL — ABNORMAL HIGH (ref 70–99)
Potassium: 3.4 mEq/L — ABNORMAL LOW (ref 3.5–5.1)
Potassium: 3.4 mEq/L — ABNORMAL LOW (ref 3.5–5.1)
Sodium: 135 mEq/L (ref 135–145)
Sodium: 136 mEq/L (ref 135–145)

## 2012-03-13 LAB — GLUCOSE, CAPILLARY
Glucose-Capillary: 144 mg/dL — ABNORMAL HIGH (ref 70–99)
Glucose-Capillary: 148 mg/dL — ABNORMAL HIGH (ref 70–99)
Glucose-Capillary: 158 mg/dL — ABNORMAL HIGH (ref 70–99)
Glucose-Capillary: 159 mg/dL — ABNORMAL HIGH (ref 70–99)
Glucose-Capillary: 164 mg/dL — ABNORMAL HIGH (ref 70–99)
Glucose-Capillary: 165 mg/dL — ABNORMAL HIGH (ref 70–99)
Glucose-Capillary: 166 mg/dL — ABNORMAL HIGH (ref 70–99)
Glucose-Capillary: 176 mg/dL — ABNORMAL HIGH (ref 70–99)
Glucose-Capillary: 177 mg/dL — ABNORMAL HIGH (ref 70–99)
Glucose-Capillary: 204 mg/dL — ABNORMAL HIGH (ref 70–99)
Glucose-Capillary: 137 mg/dL — ABNORMAL HIGH (ref 70–99)
Glucose-Capillary: 146 mg/dL — ABNORMAL HIGH (ref 70–99)
Glucose-Capillary: 148 mg/dL — ABNORMAL HIGH (ref 70–99)
Glucose-Capillary: 155 mg/dL — ABNORMAL HIGH (ref 70–99)
Glucose-Capillary: 160 mg/dL — ABNORMAL HIGH (ref 70–99)

## 2012-03-13 LAB — LIPID PANEL
LDL Cholesterol: UNDETERMINED mg/dL (ref 0–99)
Triglycerides: 542 mg/dL — ABNORMAL HIGH (ref <150)
VLDL: UNDETERMINED mg/dL (ref 0–40)

## 2012-03-13 LAB — CBC
Hemoglobin: 8.9 g/dL — ABNORMAL LOW (ref 13.0–17.0)
RBC: 3.04 MIL/uL — ABNORMAL LOW (ref 4.22–5.81)
WBC: 11.4 10*3/uL — ABNORMAL HIGH (ref 4.0–10.5)

## 2012-03-13 MED ORDER — BD GETTING STARTED TAKE HOME KIT: 1/2ML X 30G SYRINGES
1.0000 | Freq: Once | Status: DC
  Filled 2012-03-13: qty 1

## 2012-03-13 MED ORDER — SODIUM BICARBONATE 8.4 % IV SOLN
INTRAVENOUS | Status: DC
  Administered 2012-03-13 – 2012-03-15 (×4): via INTRAVENOUS
  Filled 2012-03-13 (×8): qty 150

## 2012-03-13 MED ORDER — POTASSIUM CHLORIDE CRYS ER 10 MEQ PO TBCR
40.0000 meq | EXTENDED_RELEASE_TABLET | Freq: Once | ORAL | Status: AC
  Administered 2012-03-13: 40 meq via ORAL
  Filled 2012-03-13: qty 4

## 2012-03-13 MED ORDER — POTASSIUM CHLORIDE 20 MEQ/15ML (10%) PO LIQD
40.0000 meq | Freq: Once | ORAL | Status: DC
  Filled 2012-03-13: qty 30

## 2012-03-13 NOTE — Progress Notes (Signed)
Pt. Is stable and ready to transfer to 3315. Report called to [facility], RN. All belongings are with patient and family updated.

## 2012-03-13 NOTE — Progress Notes (Signed)
Received critical from lab of Patient's CO2 being 10; previously was 9.  Will continue to monitor.  Vivi Martens RN

## 2012-03-13 NOTE — Progress Notes (Addendum)
Name: [name] [name] MRN: [id number] DOB: 12-Jul-1985    LOS: 3  Referring Provider:  [facility] Reason for Referral:  DKA/AMS  PULMONARY / CRITICAL CARE MEDICINE  HPI:  26 yo diabetic -type 2 on OHAs with DKA, admitted 10/20 pm  with profound acidosis and placed on DKA protocol. PCCM called at 0645 to evaluate for poor mental status -head CT neg & extreme acidosis. UDS neg  Will tx to ICU x 24 hrs to stabilize.   Events Since Admission: 10/21- tx to icu 10/21 - NGT out 10/22 - Abd U/S   Current Status: Stable  Vital Signs: Temp:  [97.5 F (36.4 C)-99.4 F (37.4 C)] 98.2 F (36.8 C) (10/23 0350) Pulse Rate:  [101-110] 102  (10/23 0500) Resp:  [15-23] 15  (10/23 0500) BP: (117-134)/(63-87) 129/80 mmHg (10/23 0500) SpO2:  [97 %-100 %] 99 % (10/23 0500) US Abdomen Complete  03/12/2012  *RADIOLOGY REPORT*  Clinical Data:  History of acute pancreatitis and renal failure. History of diabetes and hypertriglyceridemia.  ABDOMINAL ULTRASOUND COMPLETE  Comparison:  None  Findings:  Gallbladder: No shadowing gallstones or echogenic sludge. No evidence of pericholecystic fluid. The gallbladder wall thickness was slightly prominent measuring 2.6 mm. Gallbladder may be partially contracted.  No sonographic Murphy's sign according to the ultrasound technologist.  CBD: Normal in caliber measuring 3.4 mm. No choledocholithiasis is evident.  Liver:  Normal size and echotexture without focal parenchymal abnormality.  IVC:  Patent throughout its visualized course in the abdomen.  Pancreas:  Although the pancreas is difficult to visualize in its entirety, no focal pancreatic abnormality is identified.  Spleen:  Normal size and echotexture without focal abnormality. Length is 7 cm.  Right kidney:  No hydronephrosis.  Well-preserved cortex.  Normal parenchymal echotexture without focal abnormalities.  Right renal length is 11.4 cm.  Left kidney:  No hydronephrosis.  Well-preserved cortex.  Normal parenchymal  echotexture without focal abnormalities.  Left renal length is 11.7 cm.  Aorta:  Maximum diameter is 1.7 cm.  No aneurysm is evident. Distal portion and aortic bifurcation were obscured by bowel gas.  Ascites:  None.  IMPRESSION: No abdominal pathology was demonstrated.  The gallbladder appears partially contracted with slight prominence of the thickness of the wall of the gallbladder.  No cholelithiasis was evident.  No pericholecystic fluid was evident.  No sonographic positive Murphy's sign was present according to the technologist.   Original Report Authenticated By: Crawford Givens, M.D.    Dg Chest Port 1 View  03/12/2012  *RADIOLOGY REPORT*  Clinical Data: Evaluate endotracheal tube position.  Diabetes.  PORTABLE CHEST - 1 VIEW  Comparison: 03/10/2012  Findings: Numerous leads and wires project over the chest.  Midline trachea.  Normal heart size.  No pleural effusion or pneumothorax. Clear lungs.  Mildly low lung volumes.  IMPRESSION: No acute cardiopulmonary disease.  No endotracheal tube identified.   Original Report Authenticated By: Consuello Bossier, M.D.    Physical Examination: General:  Thin AAM. Awake Neuro:  Responds to voice, Alert and oriented x3. Answers questions HEENT:  No LAN Neck:  No JVD Cardiovascular: tachycardic Lungs:  CTA    Abdomen:  Soft, tender to palpation diffusely Musculoskeletal:  intact Skin:  warm  Principal Problem:  *DKA (diabetic ketoacidoses) Active Problems:  Diabetes mellitus without complication  Metabolic encephalopathy  High blood pressure associated with diabetes  Fatigue   ASSESSMENT AND PLAN   ENDOCRINE  A:  DKA/profound metabolic acidosis P:   -per protocol -CBG 144-204 now  on insulin drip,on D 51/2NS fluids -CO2 is 10 with gap of 14 this AM (previously <7). Continue to monitor; continue insulin until CO2 improves -Unclear if another etiology of metabolic acidosis is contributing given persistently low CO2 on insulin -Add bicarb to  fluids  PULMONARY  Lab 03/11/12 0621 03/10/12 0836  PHART 7.013* 6.845*  PCO2ART 15.3* 11.2*  PO2ART 192.0* 141.0*  HCO3 3.9* 1.9*  O2SAT 99.0 96.0     A:  Tachypnea related to acidosis P:   - On room air, protecting airway   CARDIOVASCULAR  Lab 03/11/12 1030  TROPONINI --  LATICACIDVEN 2.0  PROBNP --   ECG:  Sinus tachy Lines: Peripheral  A: No acute issue P:  - Monitor on telemetry   RENAL  Lab 03/13/12 0605 03/13/12 0032 03/12/12 1845 03/12/12 1221 03/12/12 0846  NA 136 135 134* 137 136  K 3.4* 3.5 -- -- --  CL 112 112 113* 113* 112  CO2 10* 9* 9* 11* 11*  BUN 54* 52* 48* 44* 43*  CREATININE 5.80* 5.32* 4.89* 4.23* 3.85*  CALCIUM 8.5 8.3* 8.3* 8.2* 8.4  MG -- -- -- -- --  PHOS -- -- -- -- --   Intake/Output      10/22 0701 - 10/23 0700 10/23 0701 - 10/24 0700   P.O. 120    I.V. (mL/kg) 1781.7 (35.9)    IV Piggyback 500    Total Intake(mL/kg) 2401.7 (48.4)    Urine (mL/kg/hr) 560 (0.5)    Emesis/NG output     Total Output 560    Net +1841.7          Foley:    A:  ARF P:   - Creat continues to trend up - UOP decreasing (only 150cc recorded overnight) - Continue IVF - Some waxing/waning mental status. ?Uremia contributing - Renal consult today   GASTROINTESTINAL  Lab 03/12/12 0445  AST 79*  ALT 35  ALKPHOS 77  BILITOT 0.2*  PROT 6.1  ALBUMIN 2.4*   KUB- Stomach distended with air  A:  ABD distension; ?pancreatitis P:   - Lipase elevated to 204, Amylase slightly elevated. Triglycerides >5000. Will get lipid panel now - Clears diet, advance as tolerated - Abdominal ultrasound did not show pathology - Monitor pain and nausea.    HEMATOLOGIC  Lab 03/13/12 0605 03/12/12 0445 03/11/12 0101 03/10/12 2009 03/10/12 1450 03/10/12 1330  HGB 8.9* 8.7* 10.9* 12.2* 17.0 --  HCT 24.9* 23.8* 31.0* 35.2* 50.0 --  PLT 165 148* 213 214 -- 238  INR -- -- -- -- -- --  APTT -- -- -- -- -- --   A:  Anemia. No signs of bleeding P:  - F/u AM  cbc - Transfuse only if <7  INFECTIOUS  Lab 03/13/12 0605 03/13/12 0032 03/12/12 0445 03/11/12 0823 03/11/12 0101 03/10/12 2009 03/10/12 1330  WBC 11.4* -- 13.5* -- 49.1* 20.8* 15.4*  PROCALCITON -- 3.55 -- 4.21 -- -- --   Cultures: Urine Cx 10/21 >>20k multiple morphotypes  Antibiotics: Primaxin 10/21>>  A:  No obvious source of infection P:  - Abd u/s neg. UCx unremarkable. Low suspicion of infection, will d/c Primaxin today   NEUROLOGIC  A: Acute encephalopathy P:   - neg ct of head - monitor mental status; improved   BEST PRACTICE / DISPOSITION Level of Care:  ICU >>SDU 10/23 Primary Service:  [facility] to PCCM in ICU>>Back to [facility] 10/23 Consultants:  pccm Code Status:  full Diet:  npo DVT Px:  Lovenox GI Px:  ppi Skin Integrity:  intact Social / Family:  none  Amber M. Hairford, M.D. 03/13/2012 7:59 AM  DKA improving.  Still has non-gap acidosis in setting of renal failure.  Will add HCO3 to IV fluid and consult nephrology.  No evidence for infection and Abd u/s relatively unremarkable>>will d/c Abx.  Hopefully transition off IV insulin soon, and start diet.  Transfer to SDU.  F/u fasting lipid profile, and then decide if tx needed for elevated triglycerides.  He needs to have outpt primary care f/u arranged.  Will transfer back to [facility] from 10/24 and PCCM sign off.  Coralyn Helling, MD [facility] Pulmonary/Critical Care 03/13/2012, 2:12 PM Pager:  [phone] After 3pm call: [phone]

## 2012-03-13 NOTE — Clinical Social Work Psychosocial (Signed)
     Clinical Social Work Department BRIEF PSYCHOSOCIAL ASSESSMENT 03/13/2012  Patient:  [name] [name], [name] [name]     Account Number:  [id number]     Admit date:  03/10/2012  Clinical Social Worker:  Robin Searing  Date/Time:  03/13/2012 03:29 PM  Referred by:  Physician  Date Referred:  03/13/2012 Referred for  Psychosocial assessment   Other Referral:   Interview type:  Patient Other interview type:   Parents at bedside    PSYCHOSOCIAL DATA Living Status:  FAMILY Admitted from facility:   Level of care:   Primary support name:  parents. sister Primary support relationship to patient:  FAMILY Degree of support available:   good    CURRENT CONCERNS Current Concerns  Adjustment to Illness   Other Concerns:   Patient recently diagnosed with DM- admits to not habvig a good understanding of how to manage it-    ?Substance abuse- patient denies (parents at bedside)    SOCIAL WORK ASSESSMENT / PLAN Patient recently moved to [place] from [place] and is living with his sister- his mother has applied for Medicaid and he is in school at [employer] for [employer]    Patient has good support via his family- main concerns identified at this time are in regards to his understanding and management of his Diabetes-   Assessment/plan status:  Other - See comment Other assessment/ plan:   CSW to follow up with patient at a later.   Information/referral to community resources:   DM educator  RD    PATIENTS/FAMILYS RESPONSE TO PLAN OF CARE: Patient and family receptive to this plan and appreciative of visit.

## 2012-03-13 NOTE — Progress Notes (Signed)
Inpatient Diabetes Program Recommendations  AACE/ADA: New Consensus Statement on Inpatient Glycemic Control (2013)  Target Ranges:  Prepandial:   less than 140 mg/dL      Peak postprandial:   less than 180 mg/dL (1-2 hours)      Critically ill patients:  140 - 180 mg/dL    Note: Patient is alert and oriented, engaged in conversation and appears eager to learn about diabetes and self-care behaviors.  After talking with Dr. Craige Cotta, it has been determined that the patient has type 1 DM instead of type 2 as patient was told at the [facility] in June 2013.  Spoke with pt about new diagnosis of Type 1 DM.  Discussed A1C results with him and explained what an A1C is, basic pathophysiology of DM Type 1, basic home care, importance of checking CBGs and maintaining good CBG control to prevent long-term and short-term complications. Reviewed signs and symptoms of hyperglycemia and hypoglycemia. RNs to provide ongoing basic DM education at bedside with this patient. Reviewed Living well with diabetes booklet with the patient.  Instructed the patient and his mother to watch Diabetes videos on patient education channel.  Plan to re-visit the patient this afternoon to clarify any questions he may have after watching the videos.   Thanks, Orlando Penner, RN, BSN, CCRN Diabetes Coordinator Inpatient Diabetes Program [phone]

## 2012-03-13 NOTE — Progress Notes (Signed)
Nutrition Follow-up / Consult  Intervention:    Add snacks TID between meals.    Follow-up at a later date when Mom is present for further diet education.  Assessment:   Patient is more alert today.  Wants to wait until Mom is here to complete diet education, but she will not return until 6:30 or 7:00 tonight.  Briefly discussed a few diet recommendations with patient and he was very receptive.  Wants to eat what is right for him.  Requests snacks between meals.  Patient says he is hungry; ate pretty well at lunch today.    Diet Order:  CHO-modified medium   Meds: Scheduled Meds:   . antiseptic oral rinse  15 mL Mouth Rinse q12n4p  . bd getting started take home kit  1 kit Other Once  . chlorhexidine  15 mL Mouth Rinse BID  . enoxaparin  30 mg Subcutaneous Daily  . influenza  inactive virus vaccine  0.5 mL Intramuscular Tomorrow-1000  . living well with diabetes book   Does not apply Once  . pneumococcal 23 valent vaccine  0.5 mL Intramuscular Tomorrow-1000  . potassium chloride  40 mEq Oral Once  . potassium chloride  10 mEq Intravenous Q1 Hr x 4  . white petrolatum      . DISCONTD: potassium chloride  40 mEq Oral Once   Continuous Infusions:   . insulin (NOVOLIN-R) infusion 2.4 Units/hr (03/13/12 1430)  .  sodium bicarbonate infusion 1000 mL 75 mL/hr at 03/13/12 1129  . DISCONTD: dextrose 5 % and 0.45% NaCl 1,000 mL (03/12/12 2045)   PRN Meds:.dextrose  Labs:  CMP     Component Value Date/Time   NA 133* 03/13/2012 1122   K 3.4* 03/13/2012 1122   CL 109 03/13/2012 1122   CO2 9* 03/13/2012 1122   GLUCOSE 167* 03/13/2012 1122   BUN 57* 03/13/2012 1122   CREATININE 6.26* 03/13/2012 1122   CALCIUM 8.4 03/13/2012 1122   PROT 6.1 03/12/2012 0445   ALBUMIN 2.4* 03/12/2012 0445   AST 79* 03/12/2012 0445   ALT 35 03/12/2012 0445   ALKPHOS 77 03/12/2012 0445   BILITOT 0.2* 03/12/2012 0445   GFRNONAA 11* 03/13/2012 1122   GFRAA 13* 03/13/2012 1122    CBG (last 3)    Basename 03/13/12 1435 03/13/12 1328 03/13/12 1239  GLUCAP 179* 164* 154*     Intake/Output Summary (Last 24 hours) at 03/13/12 1509 Last data filed at 03/13/12 1500  Gross per 24 hour  Intake 1943.06 ml  Output    700 ml  Net 1243.06 ml    Weight Status:  49.6 kg (10/22); no new weight today  Re-estimated needs:  1800-2000 kcals, 90-110 gm protein, 2 liters fluid daily.  Nutrition Dx:  Inadequate oral intake, progressing.  Goal:  Intake to meet >90% of estimated nutrition needs.  Monitor:  PO intake, labs, weight trend.   Joaquin Courts, RD, LDN, CNSC Pager# [phone] After Hours Pager# [phone]

## 2012-03-13 NOTE — Progress Notes (Signed)
Inpatient Diabetes Program Recommendations  AACE/ADA: New Consensus Statement on Inpatient Glycemic Control (2013)  Target Ranges:  Prepandial:   less than 140 mg/dL      Peak postprandial:   less than 180 mg/dL (1-2 hours)      Critically ill patients:  140 - 180 mg/dL   Note: MD please note that patient has no insurance coverage at this time.  Provided patient with a free sample voucher for insulin manufactured by [employer].  The voucher allows Humulin 70/30.  The voucher is only good for one vial and is limited to one offer per patient over a 12 month period.  Hopefully the patient will be approved for medical assistance in the near future.  Patient has the paperwork in his room to apply for medical assistance.  Encouraged him to go ahead and fill it out so that it can be sent in and processed as quickly as possible. Thanks, Orlando Penner, RN, BSN, CCRN Diabetes Coordinator Inpatient Diabetes Program [phone]

## 2012-03-13 NOTE — Progress Notes (Signed)
CRITICAL VALUE ALERT  Critical value received: CO2 9  Date of notification:  03/12/2012  Time of notification:  1957  Critical value read back:yes  Nurse who received alert:  Lamount Cranker  MD notified (1st page):  Dr. Sung Amabile  Time of first page:  1957  MD notified (2nd page):  Time of second page:  Responding MD:  Dr. Sung Amabile  Time MD responded:  [phone]

## 2012-03-13 NOTE — Progress Notes (Signed)
03/13/12 1300  Clinical Encounter Type  Visited With Patient and family together  Visit Type Initial    Stopped in to visit patient. visited with parents.  Chaplain Vonzella Nipple

## 2012-03-13 NOTE — Consult Note (Signed)
[facility]        RENAL CONSULT   Reason for Consult: Worsening renal function in 26 year old black man admitted 2 days ago with DKA Referring Physician: Dr. at [facility] Misko is a 26 y.o. black man diagnosed with diabetes in June of this year. He saw a doctor in [place], [place]. He recently moved to [place]. He is taking courses at [employer]. He was brought toemergency room on 03/10/2012 with weakness, weight loss, polyuria, and a decreased level of consciousness.  He did not receive radiocontrast, he has not taken NSAID's during this hospitalization CR was 0.9 on 03/10/2012, 2.60 on 21 October, 4.89 yesterday, 6.26 today.  Renal consult was requested by Dr. Craige Cotta.   Past Medical History  Diagnosis Date  . Diabetes mellitus without complication     Family history:  Father had diabetes mellitus and died age 84, mother age 32 is in good health. He has 3 brothers and 2 sisters one of his sisters has diabetes mellitus. No family history of renal disease  Social History: Born and grew up in [place] [facility] [facility], graduated from high school-currently taking courses in hospitality management past [employer], Lives with sister he is not married, he drinks alcohol once or twice a month, he doesn't smoke cigarettes,  Allergies: No Known Allergies  Medications: He is receiving IV fluids 150 mEq sodium bicarbonate in D5W at 75 cc per hour He is receiving insulin drip  ROS- no angina, no claudication, no melena, no hematochezia, no gross hematuria, no renal colic, no decreased force of urinary stream, + nocturia, no doe, no orthopnea, no purulent sputum, no hemoptysis, no weight gain, he has lost about 15 pounds in the last 6 months, no cold or heat intolerance.  PHYSICAL EXAM Blood pressure 126/87, pulse 25, temperature 98 F (36.7 C), temperature source Oral, resp. rate 20, height 5\' 5"  (1.651 m), weight 49.6 kg (109 lb 5.6 oz), SpO2 96.00%. Head-normocephalic  atraumatic Nose, mouth, pharynx-moist membranes--no inflammation, no abscess. Teeth in fair repair Neck-not stiff, carotids 2+ bilaterally Chest-clear Heart-no rub is heard Abdomen-no scars, nontender, no organs or masses are felt, bowel sounds present, no bruits are heard GU-uncircumcised penis, testes descended bilaterally Extremities-no edema, no rash, no arthritis Neuro-right-handed, strength equal, sensation okay   lab:  U A.-SG 1.016, pH 5, glucose 4+, protein 3+, nitrite negative, LE trace, hemoglobin on dipstick-large, 0-2 wbc's, 3-6 RBCs Hemoglobin 8.9, WBC 11,400, platelets 165,000 Sodium 133, potassium 3.4, chloride 109, CO2 9, BUN 57, CR 6.26, calcium 8.4, glucose 167, phosphorus 2.4, magnesium 1.7  Renal ultrasound: Right kidney 11.4 CM, left kidney 11.7 CM, normal echotexture, no hydronephrosis    Assessment/Plan: 1. DKA -glucose under control, still acidotic. Unless he becomes volume overloaded, he should continue with IV bicarbonate 2. Acute renal failure-etiology is unclear. He was not hypotensive, he did not receive radiocontrast or NSAID use, urinalysis is not look "active;" urinalysis shows large blood and only 3-6 RBCs-we'll check CK to see whether any elevation of CK which could cause rhabdo. Avoid needlesticks left forearm in case needed for vascular access 3. Anemia-check iron studies  Will follow  Sonya Pucci F 03/13/2012, 1:49 PM

## 2012-03-14 DIAGNOSIS — N179 Acute kidney failure, unspecified: Secondary | ICD-10-CM | POA: Diagnosis present

## 2012-03-14 LAB — CBC
Hemoglobin: 8.8 g/dL — ABNORMAL LOW (ref 13.0–17.0)
MCH: 29 pg (ref 26.0–34.0)
MCHC: 36.5 g/dL — ABNORMAL HIGH (ref 30.0–36.0)
Platelets: 188 10*3/uL (ref 150–400)
RDW: 15.5 % (ref 11.5–15.5)

## 2012-03-14 LAB — GLUCOSE, CAPILLARY
Glucose-Capillary: 173 mg/dL — ABNORMAL HIGH (ref 70–99)
Glucose-Capillary: 164 mg/dL — ABNORMAL HIGH (ref 70–99)
Glucose-Capillary: 167 mg/dL — ABNORMAL HIGH (ref 70–99)
Glucose-Capillary: 153 mg/dL — ABNORMAL HIGH (ref 70–99)
Glucose-Capillary: 143 mg/dL — ABNORMAL HIGH (ref 70–99)
Glucose-Capillary: 240 mg/dL — ABNORMAL HIGH (ref 70–99)
Glucose-Capillary: 252 mg/dL — ABNORMAL HIGH (ref 70–99)

## 2012-03-14 LAB — BASIC METABOLIC PANEL
BUN: 65 mg/dL — ABNORMAL HIGH (ref 6–23)
BUN: 62 mg/dL — ABNORMAL HIGH (ref 6–23)
CO2: 11 mEq/L — ABNORMAL LOW (ref 19–32)
CO2: 15 mEq/L — ABNORMAL LOW (ref 19–32)
CO2: 16 mEq/L — ABNORMAL LOW (ref 19–32)
Calcium: 8.3 mg/dL — ABNORMAL LOW (ref 8.4–10.5)
Calcium: 8.1 mg/dL — ABNORMAL LOW (ref 8.4–10.5)
Chloride: 106 mEq/L (ref 96–112)
Chloride: 101 mEq/L (ref 96–112)
Creatinine, Ser: 6.91 mg/dL — ABNORMAL HIGH (ref 0.50–1.35)
GFR calc Af Amer: 11 mL/min — ABNORMAL LOW (ref >90)
GFR calc Af Amer: 10 mL/min — ABNORMAL LOW (ref >90)
GFR calc non Af Amer: 10 mL/min — ABNORMAL LOW (ref >90)
GFR calc non Af Amer: 9 mL/min — ABNORMAL LOW (ref >90)
Glucose, Bld: 157 mg/dL — ABNORMAL HIGH (ref 70–99)
Glucose, Bld: 305 mg/dL — ABNORMAL HIGH (ref 70–99)
Potassium: 4.2 mEq/L (ref 3.5–5.1)
Potassium: 4.6 mEq/L (ref 3.5–5.1)
Potassium: 3.2 mEq/L — ABNORMAL LOW (ref 3.5–5.1)
Sodium: 136 mEq/L (ref 135–145)
Sodium: 129 mEq/L — ABNORMAL LOW (ref 135–145)
Sodium: 132 mEq/L — ABNORMAL LOW (ref 135–145)

## 2012-03-14 LAB — PHOSPHORUS: Phosphorus: 4 mg/dL (ref 2.3–4.6)

## 2012-03-14 LAB — PROTIME-INR
INR: 0.98 (ref 0.00–1.49)
Prothrombin Time: 12.9 seconds (ref 11.6–15.2)

## 2012-03-14 LAB — FERRITIN: Ferritin: 1639 ng/mL — ABNORMAL HIGH (ref 22–322)

## 2012-03-14 LAB — MAGNESIUM: Magnesium: 1.7 mg/dL (ref 1.5–2.5)

## 2012-03-14 MED ORDER — INSULIN ASPART PROT & ASPART (70-30 MIX) 100 UNIT/ML ~~LOC~~ SUSP
20.0000 [IU] | Freq: Every day | SUBCUTANEOUS | Status: DC
  Administered 2012-03-14: 20 [IU] via SUBCUTANEOUS
  Filled 2012-03-14: qty 10
  Filled 2012-03-14: qty 3

## 2012-03-14 MED ORDER — INSULIN ASPART 100 UNIT/ML ~~LOC~~ SOLN
0.0000 [IU] | Freq: Every day | SUBCUTANEOUS | Status: DC
  Administered 2012-03-14: 3 [IU] via SUBCUTANEOUS
  Administered 2012-03-17: 100 [IU] via SUBCUTANEOUS

## 2012-03-14 MED ORDER — INSULIN ASPART 100 UNIT/ML ~~LOC~~ SOLN: 0.0000 [IU] | Freq: Three times a day (TID) | SUBCUTANEOUS | Status: DC

## 2012-03-14 MED ORDER — BISACODYL 10 MG RE SUPP: 10.0000 mg | Freq: Every day | RECTAL | Status: DC | PRN

## 2012-03-14 MED ORDER — INSULIN ASPART 100 UNIT/ML ~~LOC~~ SOLN
10.0000 [IU] | Freq: Once | SUBCUTANEOUS | Status: AC
  Administered 2012-03-14: 10 [IU] via SUBCUTANEOUS

## 2012-03-14 MED ORDER — INSULIN ASPART 100 UNIT/ML ~~LOC~~ SOLN
0.0000 [IU] | Freq: Three times a day (TID) | SUBCUTANEOUS | Status: DC
  Administered 2012-03-14 – 2012-03-15 (×2): 3 [IU] via SUBCUTANEOUS
  Administered 2012-03-15: 7 [IU] via SUBCUTANEOUS

## 2012-03-14 MED ORDER — INSULIN ASPART PROT & ASPART (70-30 MIX) 100 UNIT/ML ~~LOC~~ SUSP
25.0000 [IU] | Freq: Every day | SUBCUTANEOUS | Status: DC
  Administered 2012-03-15: 25 [IU] via SUBCUTANEOUS

## 2012-03-14 NOTE — Progress Notes (Signed)
Per hospital pharmacy pt is eligible for assistance with medications at d/c. MD please provide extra set of prescriptions to case manager at time of d/c.  Johny Shock RN MPH [phone]

## 2012-03-14 NOTE — Progress Notes (Signed)
Nutrition Follow-up   Intervention:    Diet education with patient and parents completed.  Continue current diet and snacks TID.  Assessment:   Patient states he is feeling better today.  Mom and stepdad in room with patient.  Discussed CHO-counting and provided handouts.  Reviewed label reading tips, snack options, serving sizes.  Patient and mom were very receptive.  Would benefit from further diet education as an OP for reinforcement.  Diet Order:  CHO-modified medium   Meds: Scheduled Meds:    . bd getting started take home kit  1 kit Other Once  . enoxaparin  30 mg Subcutaneous Daily  . influenza  inactive virus vaccine  0.5 mL Intramuscular Tomorrow-1000  . insulin aspart  10 Units Subcutaneous Once  . insulin aspart protamine-insulin aspart  20 Units Subcutaneous Q supper  . insulin aspart protamine-insulin aspart  25 Units Subcutaneous Q breakfast  . pneumococcal 23 valent vaccine  0.5 mL Intramuscular Tomorrow-1000  . DISCONTD: antiseptic oral rinse  15 mL Mouth Rinse q12n4p  . DISCONTD: chlorhexidine  15 mL Mouth Rinse BID  . DISCONTD: insulin aspart  0-15 Units Subcutaneous TID WC   Continuous Infusions:    . insulin (NOVOLIN-R) infusion 8.2 mL/hr at 03/14/12 1134  .  sodium bicarbonate infusion 1000 mL 75 mL/hr at 03/14/12 1200   PRN Meds:.bisacodyl, dextrose  Labs:  CMP     Component Value Date/Time   NA 129* 03/14/2012 1104   K 4.6 03/14/2012 1104   CL 101 03/14/2012 1104   CO2 16* 03/14/2012 1104   GLUCOSE 246* 03/14/2012 1104   BUN 64* 03/14/2012 1104   CREATININE 6.89* 03/14/2012 1104   CALCIUM 8.1* 03/14/2012 1104   PROT 6.1 03/12/2012 0445   ALBUMIN 2.4* 03/12/2012 0445   AST 79* 03/12/2012 0445   ALT 35 03/12/2012 0445   ALKPHOS 77 03/12/2012 0445   BILITOT 0.2* 03/12/2012 0445   GFRNONAA 10* 03/14/2012 1104   GFRAA 11* 03/14/2012 1104    CBG (last 3)   Basename 03/14/12 1218 03/14/12 1133 03/14/12 1022  GLUCAP 183* 224* 275*      Intake/Output Summary (Last 24 hours) at 03/14/12 1405 Last data filed at 03/14/12 1200  Gross per 24 hour  Intake 1861.9 ml  Output    650 ml  Net 1211.9 ml    Weight Status:  49.6 kg (10/22); no new weight today  Estimated needs:  1800-2000 kcals, 90-110 gm protein, 2 liters fluid daily.  Nutrition Dx:  Inadequate oral intake, progressing.  Goal:  Intake to meet >90% of estimated nutrition needs, unmet.  Monitor:  PO intake, labs, weight trend.   Joaquin Courts, RD, LDN, CNSC Pager# [phone] After Hours Pager# [phone]

## 2012-03-14 NOTE — Progress Notes (Signed)
[facility] Progress Note [place] TEAM 1 - Stepdown/ICU TEAM   Sakari Alkhatib ONG:[id number] DOB: 09-29-1985 DOA: 03/10/2012 PCP: Sheila Oats, MD  Brief narrative: * 26 year old male patient with a recent diagnosis/ history of diabetes presents to [facility] ED with polyuria, polydipsia, headache and fatigue on 10/20.Diagnosed with Diabetic Keto Acidosis. Denies pain, vomiting, vision changes cough,shortness of breath or fever. Pt. states he has had recent weight loss. Pt. takes metformin for his diabetes, and denies missing any medication doses.  Assessment/Plan:  Principal Problem:  *DKA (diabetic ketoacidoses): * Suspect current acidosis is secondary to acute renal failure at this point. * Continue Bicarb Gtt at present, continue monitoring serum bicarb. * * Pt started on a carb modified/ Moderate diet today. * Transitioning off insulin gtt to 70/30 insulin with sliding scale at lunchtime today.  *  70/30 is patient's best option in regard to cost as he is uninsured. * Continue scheduled Q 6 hour BMET'S to monitor closely as we transition off the insulin gtt.  Active Problems:  Acute Renal Failure: * Appreciate Renal assistance. * Etiology is unclear at present. * Pt was not hypotensive, he did not receive radiocontrast or NSAID use, urinalysis does not look "active;" urinalysis shows large blood and only 3-6 RBCs- CK Not elevated.  * BUN and Creatinine continue to rise- may need dialysis. * BUN = 64; Creatinine= 6.89 * Diagnostic workup with HBsAg today per renal. * Avoid needle sticks in left forearm in case of need for vascular access.  *Diabetes Mellitus: * Diet education provided today by Registered Dietician. * Case management consulted and has seen patient. * Pt was told yesterday that we suspect Diabetes is type 1, and not type 2 as originally diagnosed. * Pt. Understands he will need insulin to treat his Diabetes, and not the oral agents he was taking prior to  admission. * Pt will need continued teaching, reinforcement, and community resources to maintain his disease successfully.  Metabolic Encephalopathy: * Appears to be resolved. * Pt. Alert and conversant this morning and asking appropriate questions.  High Blood Pressure associated with Diabetes: *Blood Pressures appear to be within normal range.  * Continue to monitor.   * Anemia: * HGB today ( 10/24) is 8.8; HCT is 24.1. * Anemia panel completed,Fe/TIBC 34 %, ferritin 1639,    per renal does not need IV Fe. * Continue to monitor  Hypertriglyceridemia: * Result obtained 10/21 was > 5000 *Most likely secondary to hyperglycemia. * Pt. Currently treated with insulin gtt for DKA which should assist with treating hypertriglyceridemia. * Abdominal US was negative for Gall Bladder abnormalities and showed no focal pancreatic abnormalities. * Pt. Denies and nausea or vomiting. * States he has feeling of fullness in his left upper quadrant. * Has not had BM since admission. Dulcolax suppository ordered prn. * Recheck value before discharge . * Risk Stratification for CAD.   DVT prophylaxis: *Lovenox 30 mg   Code Status: * Full  Family Communication: * Spoke directly with patient , mother and Step-father at bedside  Disposition Plan:  Remain in Step Down Unit  Consultants: Renal Diabetes Team Case Management   Procedures: * None  Antibiotics: * None  HPI/Subjective: * Pt is alert and appropriate. He is participating in the conversation and asking appropriate questions. He states he does feel better.    Objective: Blood pressure 127/82, pulse 96, temperature 98.8 F (37.1 C), temperature source Oral, resp. rate 19, height 5\' 5"  (1.651 m), weight 49.6 kg (  109 lb 5.6 oz), SpO2 100.00%.  Intake/Output Summary (Last 24 hours) at 03/14/12 1444 Last data filed at 03/14/12 1200  Gross per 24 hour  Intake 1858.75 ml  Output    650 ml  Net 1208.75 ml      Exam: General: No acute respiratory distress Lungs: Clear to auscultation bilaterally without wheezes or crackles Cardiovascular: Regular rate and rhythm without murmur gallop or rub normal S1 and S2 Abdomen: Nontender, nondistended, soft, bowel sounds positive, no rebound, no ascites, no appreciable mass.  Extremities: No significant cyanosis, clubbing, or edema bilateral lower extremities  Data Reviewed: Basic Metabolic Panel:  Lab 03/14/12 1610 03/14/12 0610 03/14/12 0010 03/13/12 1818 03/13/12 1122 03/13/12 0605  NA 129* 132* 136 131* 133* --  K 4.6 4.2 3.8 4.5 3.4* --  CL 101 106 110 109 109 --  CO2 16* 15* 11* 10* 9* --  GLUCOSE 246* 156* 157* 179* 167* --  BUN 64* 62* 65* 58* 57* --  CREATININE 6.89* 6.91* 6.87* 5.72* 6.26* --  CALCIUM 8.1* 8.0* 8.3* 8.3* 8.4 --  MG -- 1.7 -- -- -- 1.7  PHOS -- 4.0 -- -- -- 2.4   Liver Function Tests:  Lab 03/12/12 0445  AST 79*  ALT 35  ALKPHOS 77  BILITOT 0.2*  PROT 6.1  ALBUMIN 2.4*    Lab 03/12/12 0445 03/11/12 1023 03/10/12 2153  LIPASE 204* -- 62*  AMYLASE -- 115* --   No results found for this basename: AMMONIA:5 in the last 168 hours CBC:  Lab 03/14/12 0610 03/13/12 0605 03/12/12 0445 03/11/12 0101 03/10/12 2009  WBC 9.9 11.4* 13.5* 49.1* 20.8*  NEUTROABS -- -- -- -- --  HGB 8.8* 8.9* 8.7* 10.9* 12.2*  HCT 24.1* 24.9* 23.8* 31.0* 35.2*  MCV 79.5 81.9 82.1 88.3 90.3  PLT 188 165 148* 213 214   Cardiac Enzymes:  Lab 03/14/12 0610 03/13/12 1818  CKTOTAL 62 48  CKMB -- --  CKMBINDEX -- --  TROPONINI -- --   BNP (last 3 results) No results found for this basename: PROBNP:3 in the last 8760 hours CBG:  Lab 03/14/12 1218 03/14/12 1133 03/14/12 1022 03/14/12 0925 03/14/12 0821  GLUCAP 183* 224* 275* 209* 150*    Recent Results (from the past 240 hour(s))  MRSA PCR SCREENING     Status: Normal   Collection Time   03/10/12 11:18 PM      Component Value Range Status Comment   MRSA by PCR NEGATIVE   NEGATIVE Final   URINE CULTURE     Status: Normal   Collection Time   03/11/12  1:57 PM      Component Value Range Status Comment   Specimen Description URINE, CLEAN CATCH   Final    Special Requests NONE   Final    Culture  Setup Time 03/11/2012 15:18   Final    Colony Count 25,000 COLONIES/ML   Final    Culture     Final    Value: Multiple bacterial morphotypes present, none predominant. Suggest appropriate recollection if clinically indicated.   Report Status 03/12/2012 FINAL   Final      Studies:  Recent x-ray studies have been reviewed in detail by the Attending Physician  Scheduled Meds:  Reviewed in detail by the Attending Physician  Scribed by Kandice Robinsons, RN  ACNP Student [employer] CON for Dr. Calvert Cantor  On-Call/Text Page:      [email or web address]      password [email or web address]  If 7PM-7AM, please contact  night-coverage [email or web address] Password [email or web address] 03/14/2012, 2:44 PM   LOS: 4 days   I have examined the above patient and reviewed the chart. I have modified the above note and agree with it.   Calvert Cantor, MD [phone]

## 2012-03-14 NOTE — Progress Notes (Signed)
[facility]  Subjective:  Awake, alert, not SOB, mother and stepfather at bedside I/O yesterday 1713/1175 Still on IV bicarb 75/hr   Objective: Vital signs in last 24 hours: Blood pressure 125/80, pulse 96, temperature 98.9 F (37.2 C), temperature source Oral, resp. rate 16, height 5\' 5"  (1.651 m), weight 49.6 kg (109 lb 5.6 oz), SpO2 99.00%.    PHYSICAL EXAM General--as above Chest--clear Heart--no rub Abd--nontender Extr--no edema, no flap  Lab Results:   Lab 03/14/12 0610 03/14/12 0010 03/13/12 1818 03/13/12 0605  NA 132* 136 131* --  K 4.2 3.8 4.5 --  CL 106 110 109 --  CO2 15* 11* 10* --  BUN 62* 65* 58* --  CREATININE 6.91* 6.87* 5.72* --  ALB -- -- -- --  GLUCOSE 156* -- -- --  CALCIUM 8.0* 8.3* 8.3* --  PHOS 4.0 -- -- 2.4     Basename 03/14/12 0610 03/13/12 0605  WBC 9.9 11.4*  HGB 8.8* 8.9*  HCT 24.1* 24.9*  PLT 188 165     I have reviewed the patient's current medications  Assessment/Plan: 1. DKA -glucose under control, still acidotic. Will continue with IV bicarbonate--no signs of vol. overload  2. Acute renal failure-etiology is still unclear. He was not hypotensive, he did not receive radiocontrast or NSAID use, urinalysis is not look "active;" urinalysis shows large blood and only 3-6 RBCs- CK  Not elevated. Avoid needlesticks left forearm in case needed for vascular access.  Send HBsAg today--may need HD if lab continues to worsen.  3. Anemia-Fe/TIBC 34 %, ferritin 1639--doesn't need IV Fe     LOS: 4 days   [name] [name] F 03/14/2012,9:46 AM   .[email or web address]

## 2012-03-15 ENCOUNTER — Encounter (HOSPITAL_COMMUNITY): Payer: Self-pay | Admitting: Radiology

## 2012-03-15 ENCOUNTER — Inpatient Hospital Stay (HOSPITAL_COMMUNITY): Payer: Self-pay

## 2012-03-15 ENCOUNTER — Inpatient Hospital Stay (HOSPITAL_COMMUNITY): Payer: MEDICAID

## 2012-03-15 DIAGNOSIS — N179 Acute kidney failure, unspecified: Secondary | ICD-10-CM

## 2012-03-15 LAB — BASIC METABOLIC PANEL
BUN: 75 mg/dL — ABNORMAL HIGH (ref 6–23)
CO2: 20 mEq/L (ref 19–32)
CO2: 22 mEq/L (ref 19–32)
Calcium: 7.8 mg/dL — ABNORMAL LOW (ref 8.4–10.5)
Chloride: 100 mEq/L (ref 96–112)
Chloride: 97 mEq/L (ref 96–112)
Creatinine, Ser: 8.52 mg/dL — ABNORMAL HIGH (ref 0.50–1.35)
GFR calc Af Amer: 10 mL/min — ABNORMAL LOW (ref >90)
GFR calc non Af Amer: 8 mL/min — ABNORMAL LOW (ref >90)
Glucose, Bld: 200 mg/dL — ABNORMAL HIGH (ref 70–99)
Glucose, Bld: 383 mg/dL — ABNORMAL HIGH (ref 70–99)
Potassium: 3.3 mEq/L — ABNORMAL LOW (ref 3.5–5.1)
Potassium: 3 mEq/L — ABNORMAL LOW (ref 3.5–5.1)
Sodium: 133 mEq/L — ABNORMAL LOW (ref 135–145)

## 2012-03-15 LAB — GLUCOSE, CAPILLARY
Glucose-Capillary: 217 mg/dL — ABNORMAL HIGH (ref 70–99)
Glucose-Capillary: 348 mg/dL — ABNORMAL HIGH (ref 70–99)
Glucose-Capillary: 198 mg/dL — ABNORMAL HIGH (ref 70–99)
Glucose-Capillary: 208 mg/dL — ABNORMAL HIGH (ref 70–99)

## 2012-03-15 LAB — MAGNESIUM: Magnesium: 1.7 mg/dL (ref 1.5–2.5)

## 2012-03-15 LAB — PHOSPHORUS: Phosphorus: 4.7 mg/dL — ABNORMAL HIGH (ref 2.3–4.6)

## 2012-03-15 MED ORDER — MIDAZOLAM HCL 2 MG/2ML IJ SOLN
INTRAMUSCULAR | Status: AC | PRN
  Administered 2012-03-15: 1 mg via INTRAVENOUS

## 2012-03-15 MED ORDER — INSULIN ASPART PROT & ASPART (70-30 MIX) 100 UNIT/ML ~~LOC~~ SUSP
30.0000 [IU] | Freq: Every day | SUBCUTANEOUS | Status: DC
  Administered 2012-03-16 – 2012-03-21 (×5): 30 [IU] via SUBCUTANEOUS
  Filled 2012-03-15: qty 3

## 2012-03-15 MED ORDER — FENTANYL CITRATE 0.05 MG/ML IJ SOLN
INTRAMUSCULAR | Status: AC | PRN
  Administered 2012-03-15: 50 ug via INTRAVENOUS

## 2012-03-15 MED ORDER — HEPARIN SODIUM (PORCINE) 1000 UNIT/ML DIALYSIS
20.0000 [IU]/kg | INTRAMUSCULAR | Status: DC | PRN
  Filled 2012-03-15: qty 1

## 2012-03-15 MED ORDER — INSULIN ASPART 100 UNIT/ML ~~LOC~~ SOLN: 0.0000 [IU] | Freq: Every day | SUBCUTANEOUS | Status: DC

## 2012-03-15 MED ORDER — INSULIN ASPART PROT & ASPART (70-30 MIX) 100 UNIT/ML ~~LOC~~ SUSP
25.0000 [IU] | Freq: Every day | SUBCUTANEOUS | Status: DC
  Administered 2012-03-16 – 2012-03-17 (×2): 25 [IU] via SUBCUTANEOUS
  Filled 2012-03-15: qty 3

## 2012-03-15 MED ORDER — INSULIN ASPART 100 UNIT/ML ~~LOC~~ SOLN
0.0000 [IU] | Freq: Three times a day (TID) | SUBCUTANEOUS | Status: DC
  Administered 2012-03-16: 10 [IU] via SUBCUTANEOUS
  Administered 2012-03-16: 5 [IU] via SUBCUTANEOUS
  Administered 2012-03-16: 15 [IU] via SUBCUTANEOUS
  Administered 2012-03-17: 8 [IU] via SUBCUTANEOUS
  Administered 2012-03-17: 15 [IU] via SUBCUTANEOUS
  Administered 2012-03-17: 3 [IU] via SUBCUTANEOUS

## 2012-03-15 NOTE — Progress Notes (Signed)
[facility] Progress Note [place] TEAM 1 - Stepdown/ICU TEAM   [name] [name] AVW:[id number] DOB: [birth year] DOA: 03/10/2012 PCP: Sheila Oats, MD  Brief narrative: * 26 year old male patient with a recent diagnosis/ history of diabetes presents to [facility] ED with polyuria, polydipsia, headache and fatigue on 10/20.Diagnosed with Diabetic Keto Acidosis. Denies pain, vomiting, vision changes cough,shortness of breath or fever. Pt. states he has had recent weight loss. Pt. takes metformin for his diabetes, and denies missing any medication doses.  Assessment/Plan:  Principal Problem:  *DKA (diabetic ketoacidoses): * Suspect current acidosis is secondary to acute renal failure at this point. * Continue Bicarb Gtt at present, continue monitoring serum bicarb. * * Pt started on a carb modified/ Moderate diet * Transitioned off insulin gtt to 70/30 insulin with sliding scale .  *  70/30 is patient's best option in regard to cost as he is uninsured. Will titrate insulin up today as sugars still uncontrolled.    Active Problems:  Acute Renal Failure: * Appreciate Renal assistance. * Etiology is unclear at present. * Pt was not hypotensive, he did not receive radiocontrast or NSAID use, urinalysis does not look "active;" urinalysis shows large blood and only 3-6 RBCs- CK Not elevated.  * He will undergo dialysis today.   *Diabetes Mellitus: * Diet education provided today by Registered Dietician. * Case management consulted and has seen patient. * Pt was told yesterday that we suspect Diabetes is type 1, and not type 2 as originally diagnosed. * Pt. Understands he will need insulin to treat his Diabetes, and not the oral agents he was taking prior to admission. * Pt will need continued teaching, reinforcement, and community resources to maintain his disease successfully.  Metabolic Encephalopathy: * Appears to be resolved. * Pt. Alert and conversant this morning and asking  appropriate questions.  High Blood Pressure associated with Diabetes: *Blood Pressures appear to be within normal range.  * Continue to monitor.   * Anemia: * Anemia panel completed,Fe/TIBC 34 %, ferritin 1639,    per renal does not need IV Fe. * Continue to monitor  Hypertriglyceridemia: * Result obtained 10/21 was > 5000 *Most likely secondary to hyperglycemia. * Pt. Currently treated with insulin gtt for DKA which should assist with treating hypertriglyceridemia. * Abdominal US was negative for Gall Bladder abnormalities and showed no focal pancreatic abnormalities. * Pt. Denies and nausea or vomiting. * States he has feeling of fullness in his left upper quadrant. * Has not had BM since admission. Dulcolax suppository ordered prn. * Recheck value before discharge . * Risk Stratification for CAD.   DVT prophylaxis: *Lovenox 30 mg   Code Status: * Full  Disposition Plan:  Remain in Step Down Unit  Consultants: Renal Diabetes Team Case Management   Procedures: * None  Antibiotics: * None  HPI/Subjective: * Pt is alert and appropriate. He is participating in the conversation and asking appropriate questions. He states he does feel better.    Objective: Blood pressure 127/82, pulse 96, temperature 98.8 F (37.1 C), temperature source Oral, resp. rate 19, height 5\' 5"  (1.651 m), weight 49.6 kg (109 lb 5.6 oz), SpO2 100.00%.  Intake/Output Summary (Last 24 hours) at 03/14/12 1444 Last data filed at 03/14/12 1200  Gross per 24 hour  Intake 1858.75 ml  Output    650 ml  Net 1208.75 ml     Exam: General: No acute respiratory distress Lungs: Clear to auscultation bilaterally without wheezes or crackles Cardiovascular: Regular rate and  rhythm without murmur gallop or rub normal S1 and S2 Abdomen: Nontender, nondistended, soft, bowel sounds positive, no rebound, no ascites, no appreciable mass.  Extremities: No significant cyanosis, clubbing, or edema  bilateral lower extremities  Data Reviewed: Basic Metabolic Panel:  Lab 03/14/12 9604 03/14/12 0610 03/14/12 0010 03/13/12 1818 03/13/12 1122 03/13/12 0605  NA 129* 132* 136 131* 133* --  K 4.6 4.2 3.8 4.5 3.4* --  CL 101 106 110 109 109 --  CO2 16* 15* 11* 10* 9* --  GLUCOSE 246* 156* 157* 179* 167* --  BUN 64* 62* 65* 58* 57* --  CREATININE 6.89* 6.91* 6.87* 5.72* 6.26* --  CALCIUM 8.1* 8.0* 8.3* 8.3* 8.4 --  MG -- 1.7 -- -- -- 1.7  PHOS -- 4.0 -- -- -- 2.4   Liver Function Tests:  Lab 03/12/12 0445  AST 79*  ALT 35  ALKPHOS 77  BILITOT 0.2*  PROT 6.1  ALBUMIN 2.4*    Lab 03/12/12 0445 03/11/12 1023 03/10/12 2153  LIPASE 204* -- 62*  AMYLASE -- 115* --   No results found for this basename: AMMONIA:5 in the last 168 hours CBC:  Lab 03/14/12 0610 03/13/12 0605 03/12/12 0445 03/11/12 0101 03/10/12 2009  WBC 9.9 11.4* 13.5* 49.1* 20.8*  NEUTROABS -- -- -- -- --  HGB 8.8* 8.9* 8.7* 10.9* 12.2*  HCT 24.1* 24.9* 23.8* 31.0* 35.2*  MCV 79.5 81.9 82.1 88.3 90.3  PLT 188 165 148* 213 214   Cardiac Enzymes:  Lab 03/14/12 0610 03/13/12 1818  CKTOTAL 62 48  CKMB -- --  CKMBINDEX -- --  TROPONINI -- --   BNP (last 3 results) No results found for this basename: PROBNP:3 in the last 8760 hours CBG:  Lab 03/14/12 1218 03/14/12 1133 03/14/12 1022 03/14/12 0925 03/14/12 0821  GLUCAP 183* 224* 275* 209* 150*    Recent Results (from the past 240 hour(s))  MRSA PCR SCREENING     Status: Normal   Collection Time   03/10/12 11:18 PM      Component Value Range Status Comment   MRSA by PCR NEGATIVE  NEGATIVE Final   URINE CULTURE     Status: Normal   Collection Time   03/11/12  1:57 PM      Component Value Range Status Comment   Specimen Description URINE, CLEAN CATCH   Final    Special Requests NONE   Final    Culture  Setup Time 03/11/2012 15:18   Final    Colony Count 25,000 COLONIES/ML   Final    Culture     Final    Value: Multiple bacterial morphotypes present,  none predominant. Suggest appropriate recollection if clinically indicated.   Report Status 03/12/2012 FINAL   Final      Studies:  Recent x-ray studies have been reviewed in detail by the Attending Physician  Scheduled Meds:  Reviewed in detail by the Attending Physician  Calvert Cantor, MD [phone]  On-Call/Text Page:      [email or web address]      password [email or web address]  If 7PM-7AM, please contact night-coverage [email or web address] Password [email or web address] 03/15/2012, 5:43 PM   LOS: 5 days

## 2012-03-15 NOTE — Progress Notes (Signed)
[facility]  Subjective:  Asleep, but awakens easily.  Feels well, No N or V.   Objective: Vital signs in last 24 hours: Blood pressure 131/78, pulse 83, temperature 98.8 F (37.1 C), temperature source Oral, resp. rate 15, height 5\' 5"  (1.651 m), weight 49.6 kg (109 lb 5.6 oz), SpO2 100.00%.    PHYSICAL EXAM General--as above  Chest--clear Heart--no rub Abd--nontender Extr--no edema  Lab Results:   Lab 03/15/12 0720 03/15/12 0009 03/14/12 1833 03/14/12 0610 03/13/12 0605  NA 135 132* 132* -- --  K 3.5 3.3* 3.2* -- --  CL 100 100 101 -- --  CO2 18* 20 15* -- --  BUN 75* 72* 73* -- --  CREATININE 8.52* 7.91* 7.71* -- --  ALB -- -- -- -- --  GLUCOSE 200* -- -- -- --  CALCIUM 7.8* 7.7* 8.1* -- --  PHOS 4.7* -- -- 4.0 2.4     Basename 03/14/12 0610 03/13/12 0605  WBC 9.9 11.4*  HGB 8.8* 8.9*  HCT 24.1* 24.9*  PLT 188 165   I have reviewed the patient's current medications.   Assessment/Plan:   1. DKA -glucose under control, less acidotic.D/C IV bicarb 2. Acute renal failure-etiology is still unclear. He was not hypotensive, he did not receive radiocontrast or NSAID use, urinalysis is not look "active;" urinalysis shows large blood and only 3-6 RBCs- CK Not elevated. Avoid needlesticks left forearm in case needed for vascular access.HBsAg negative--will dialyze today.  I've asked IR to place temporary HD cath.  Lovenox not given today.  Will recheck UA.  Vein mapping on Monday 3. Anemia-Fe/TIBC 34 %, ferritin 1639--doesn't need IV Fe   LOS: 5 days   Herve Haug F 03/15/2012,10:33 AM   .labalb

## 2012-03-15 NOTE — H&P (Signed)
[name] [name] is an 26 y.o. male.   Chief Complaint: Diabetic ketoacidosis Fatigue and weak Worsening renal functions Scheduled for temporary dialysis catheter per Dr Caryn Section HPI: ARF  Past Medical History  Diagnosis Date  . Diabetes mellitus without complication     History reviewed. No pertinent past surgical history.  History reviewed. No pertinent family history. Social History:  reports that he has been smoking.  He does not have any smokeless tobacco history on file. He reports that he drinks alcohol. He reports that he does not use illicit drugs.  Allergies: No Known Allergies  Medications Prior to Admission  Medication Sig Dispense Refill  . glipiZIDE (GLUCOTROL) 5 MG tablet Take 5 mg by mouth every 3 (three) days.      . metFORMIN (GLUCOPHAGE) 1000 MG tablet Take 1,000 mg by mouth daily.        Results for orders placed during the hospital encounter of 03/10/12 (from the past 48 hour(s))  GLUCOSE, CAPILLARY     Status: Abnormal   Collection Time   03/13/12 11:18 AM      Component Value Range Comment   Glucose-Capillary 163 (*) 70 - 99 mg/dL   BASIC METABOLIC PANEL     Status: Abnormal   Collection Time   03/13/12 11:22 AM      Component Value Range Comment   Sodium 133 (*) 135 - 145 mEq/L    Potassium 3.4 (*) 3.5 - 5.1 mEq/L    Chloride 109  96 - 112 mEq/L    CO2 9 (*) 19 - 32 mEq/L    Glucose, Bld 167 (*) 70 - 99 mg/dL    BUN 57 (*) 6 - 23 mg/dL    Creatinine, Ser 4.69 (*) 0.50 - 1.35 mg/dL    Calcium 8.4  8.4 - 62.9 mg/dL    GFR calc non Af Amer 11 (*) >90 mL/min    GFR calc Af Amer 13 (*) >90 mL/min   GLUCOSE, CAPILLARY     Status: Abnormal   Collection Time   03/13/12 12:39 PM      Component Value Range Comment   Glucose-Capillary 154 (*) 70 - 99 mg/dL   GLUCOSE, CAPILLARY     Status: Abnormal   Collection Time   03/13/12  1:28 PM      Component Value Range Comment   Glucose-Capillary 164 (*) 70 - 99 mg/dL   GLUCOSE, CAPILLARY     Status: Abnormal   Collection Time   03/13/12  2:35 PM      Component Value Range Comment   Glucose-Capillary 179 (*) 70 - 99 mg/dL   GLUCOSE, CAPILLARY     Status: Abnormal   Collection Time   03/13/12  3:41 PM      Component Value Range Comment   Glucose-Capillary 160 (*) 70 - 99 mg/dL   GLUCOSE, CAPILLARY     Status: Abnormal   Collection Time   03/13/12  4:45 PM      Component Value Range Comment   Glucose-Capillary 173 (*) 70 - 99 mg/dL    Comment 1 Documented in Chart      Comment 2 Notify RN     GLUCOSE, CAPILLARY     Status: Abnormal   Collection Time   03/13/12  5:42 PM      Component Value Range Comment   Glucose-Capillary 159 (*) 70 - 99 mg/dL    Comment 1 Documented in Chart      Comment 2 Notify RN  BASIC METABOLIC PANEL     Status: Abnormal   Collection Time   03/13/12  6:18 PM      Component Value Range Comment   Sodium 131 (*) 135 - 145 mEq/L    Potassium 4.5  3.5 - 5.1 mEq/L    Chloride 109  96 - 112 mEq/L    CO2 10 (*) 19 - 32 mEq/L    Glucose, Bld 179 (*) 70 - 99 mg/dL    BUN 58 (*) 6 - 23 mg/dL    Creatinine, Ser 5.40 (*) 0.50 - 1.35 mg/dL    Calcium 8.3 (*) 8.4 - 10.5 mg/dL POST-ULTRACENTRIFUGATION   GFR calc non Af Amer 12 (*) >90 mL/min    GFR calc Af Amer 14 (*) >90 mL/min   CK     Status: Normal   Collection Time   03/13/12  6:18 PM      Component Value Range Comment   Total CK 48  7 - 232 U/L HEMOLYSIS AT THIS LEVEL MAY AFFECT RESULT  FERRITIN     Status: Abnormal   Collection Time   03/13/12  6:18 PM      Component Value Range Comment   Ferritin 1639 (*) 22 - 322 ng/mL   IRON AND TIBC     Status: Abnormal   Collection Time   03/13/12  6:18 PM      Component Value Range Comment   Iron 53  42 - 135 ug/dL    TIBC 981 (*) 191 - 478 ug/dL    Saturation Ratios 34  20 - 55 %    UIBC 102 (*) 125 - 400 ug/dL   GLUCOSE, CAPILLARY     Status: Abnormal   Collection Time   03/13/12  6:44 PM      Component Value Range Comment   Glucose-Capillary 170 (*) 70 - 99  mg/dL    Comment 1 Documented in Chart      Comment 2 Notify RN     GLUCOSE, CAPILLARY     Status: Abnormal   Collection Time   03/13/12  7:58 PM      Component Value Range Comment   Glucose-Capillary 173 (*) 70 - 99 mg/dL   GLUCOSE, CAPILLARY     Status: Abnormal   Collection Time   03/13/12  9:03 PM      Component Value Range Comment   Glucose-Capillary 168 (*) 70 - 99 mg/dL   GLUCOSE, CAPILLARY     Status: Abnormal   Collection Time   03/13/12 10:08 PM      Component Value Range Comment   Glucose-Capillary 164 (*) 70 - 99 mg/dL    Comment 1 Notify RN      Comment 2 Documented in Chart     GLUCOSE, CAPILLARY     Status: Abnormal   Collection Time   03/13/12 11:14 PM      Component Value Range Comment   Glucose-Capillary 152 (*) 70 - 99 mg/dL   BASIC METABOLIC PANEL     Status: Abnormal   Collection Time   03/14/12 12:10 AM      Component Value Range Comment   Sodium 136  135 - 145 mEq/L    Potassium 3.8  3.5 - 5.1 mEq/L    Chloride 110  96 - 112 mEq/L    CO2 11 (*) 19 - 32 mEq/L    Glucose, Bld 157 (*) 70 - 99 mg/dL    BUN 65 (*) 6 - 23 mg/dL  Creatinine, Ser 6.87 (*) 0.50 - 1.35 mg/dL    Calcium 8.3 (*) 8.4 - 10.5 mg/dL    GFR calc non Af Amer 10 (*) >90 mL/min    GFR calc Af Amer 11 (*) >90 mL/min   GLUCOSE, CAPILLARY     Status: Abnormal   Collection Time   03/14/12 12:21 AM      Component Value Range Comment   Glucose-Capillary 144 (*) 70 - 99 mg/dL   GLUCOSE, CAPILLARY     Status: Abnormal   Collection Time   03/14/12  1:16 AM      Component Value Range Comment   Glucose-Capillary 153 (*) 70 - 99 mg/dL   GLUCOSE, CAPILLARY     Status: Abnormal   Collection Time   03/14/12  2:24 AM      Component Value Range Comment   Glucose-Capillary 171 (*) 70 - 99 mg/dL   GLUCOSE, CAPILLARY     Status: Abnormal   Collection Time   03/14/12  3:27 AM      Component Value Range Comment   Glucose-Capillary 159 (*) 70 - 99 mg/dL   GLUCOSE, CAPILLARY     Status:  Abnormal   Collection Time   03/14/12  4:30 AM      Component Value Range Comment   Glucose-Capillary 167 (*) 70 - 99 mg/dL   GLUCOSE, CAPILLARY     Status: Abnormal   Collection Time   03/14/12  5:24 AM      Component Value Range Comment   Glucose-Capillary 156 (*) 70 - 99 mg/dL   CBC     Status: Abnormal   Collection Time   03/14/12  6:10 AM      Component Value Range Comment   WBC 9.9  4.0 - 10.5 K/uL WHITE COUNT CONFIRMED ON SMEAR   RBC 3.03 (*) 4.22 - 5.81 MIL/uL    Hemoglobin 8.8 (*) 13.0 - 17.0 g/dL CORRECTED FOR LIPEMIA   HCT 24.1 (*) 39.0 - 52.0 %    MCV 79.5  78.0 - 100.0 fL    MCH 29.0  26.0 - 34.0 pg    MCHC 36.5 (*) 30.0 - 36.0 g/dL CORRECTED FOR LIPEMIA   RDW 15.5  11.5 - 15.5 %    Platelets 188  150 - 400 K/uL PLATELET COUNT CONFIRMED BY SMEAR  MAGNESIUM     Status: Normal   Collection Time   03/14/12  6:10 AM      Component Value Range Comment   Magnesium 1.7  1.5 - 2.5 mg/dL   PHOSPHORUS     Status: Normal   Collection Time   03/14/12  6:10 AM      Component Value Range Comment   Phosphorus 4.0  2.3 - 4.6 mg/dL   CK     Status: Normal   Collection Time   03/14/12  6:10 AM      Component Value Range Comment   Total CK 62  7 - 232 U/L   BASIC METABOLIC PANEL     Status: Abnormal   Collection Time   03/14/12  6:10 AM      Component Value Range Comment   Sodium 132 (*) 135 - 145 mEq/L    Potassium 4.2  3.5 - 5.1 mEq/L    Chloride 106  96 - 112 mEq/L    CO2 15 (*) 19 - 32 mEq/L    Glucose, Bld 156 (*) 70 - 99 mg/dL    BUN 62 (*) 6 - 23 mg/dL  Creatinine, Ser 6.91 (*) 0.50 - 1.35 mg/dL    Calcium 8.0 (*) 8.4 - 10.5 mg/dL    GFR calc non Af Amer 10 (*) >90 mL/min    GFR calc Af Amer 11 (*) >90 mL/min   GLUCOSE, CAPILLARY     Status: Abnormal   Collection Time   03/14/12  6:26 AM      Component Value Range Comment   Glucose-Capillary 153 (*) 70 - 99 mg/dL   GLUCOSE, CAPILLARY     Status: Abnormal   Collection Time   03/14/12  7:22 AM       Component Value Range Comment   Glucose-Capillary 143 (*) 70 - 99 mg/dL   GLUCOSE, CAPILLARY     Status: Abnormal   Collection Time   03/14/12  8:21 AM      Component Value Range Comment   Glucose-Capillary 150 (*) 70 - 99 mg/dL   GLUCOSE, CAPILLARY     Status: Abnormal   Collection Time   03/14/12  9:25 AM      Component Value Range Comment   Glucose-Capillary 209 (*) 70 - 99 mg/dL   GLUCOSE, CAPILLARY     Status: Abnormal   Collection Time   03/14/12 10:22 AM      Component Value Range Comment   Glucose-Capillary 275 (*) 70 - 99 mg/dL   BASIC METABOLIC PANEL     Status: Abnormal   Collection Time   03/14/12 11:04 AM      Component Value Range Comment   Sodium 129 (*) 135 - 145 mEq/L    Potassium 4.6  3.5 - 5.1 mEq/L    Chloride 101  96 - 112 mEq/L    CO2 16 (*) 19 - 32 mEq/L    Glucose, Bld 246 (*) 70 - 99 mg/dL    BUN 64 (*) 6 - 23 mg/dL    Creatinine, Ser 1.61 (*) 0.50 - 1.35 mg/dL    Calcium 8.1 (*) 8.4 - 10.5 mg/dL    GFR calc non Af Amer 10 (*) >90 mL/min    GFR calc Af Amer 11 (*) >90 mL/min   PROTIME-INR     Status: Normal   Collection Time   03/14/12 11:04 AM      Component Value Range Comment   Prothrombin Time 12.9  11.6 - 15.2 seconds LIPEMIC   INR 0.98  0.00 - 1.49 LIPEMIC  GLUCOSE, CAPILLARY     Status: Abnormal   Collection Time   03/14/12 11:33 AM      Component Value Range Comment   Glucose-Capillary 224 (*) 70 - 99 mg/dL   GLUCOSE, CAPILLARY     Status: Abnormal   Collection Time   03/14/12 12:18 PM      Component Value Range Comment   Glucose-Capillary 183 (*) 70 - 99 mg/dL   GLUCOSE, CAPILLARY     Status: Abnormal   Collection Time   03/14/12  3:45 PM      Component Value Range Comment   Glucose-Capillary 208 (*) 70 - 99 mg/dL    Comment 1 Notify RN      Comment 2 Documented in Chart     GLUCOSE, CAPILLARY     Status: Abnormal   Collection Time   03/14/12  5:48 PM      Component Value Range Comment   Glucose-Capillary 240 (*) 70 - 99  mg/dL    Comment 1 Notify RN      Comment 2 Documented in Chart  BASIC METABOLIC PANEL     Status: Abnormal   Collection Time   03/14/12  6:33 PM      Component Value Range Comment   Sodium 132 (*) 135 - 145 mEq/L    Potassium 3.2 (*) 3.5 - 5.1 mEq/L    Chloride 101  96 - 112 mEq/L    CO2 15 (*) 19 - 32 mEq/L    Glucose, Bld 305 (*) 70 - 99 mg/dL    BUN 73 (*) 6 - 23 mg/dL    Creatinine, Ser 1.61 (*) 0.50 - 1.35 mg/dL    Calcium 8.1 (*) 8.4 - 10.5 mg/dL    GFR calc non Af Amer 9 (*) >90 mL/min    GFR calc Af Amer 10 (*) >90 mL/min   HEPATITIS B SURFACE ANTIGEN     Status: Normal   Collection Time   03/14/12  6:34 PM      Component Value Range Comment   Hepatitis B Surface Ag NEGATIVE  NEGATIVE   GLUCOSE, CAPILLARY     Status: Abnormal   Collection Time   03/14/12 10:07 PM      Component Value Range Comment   Glucose-Capillary 252 (*) 70 - 99 mg/dL    Comment 1 Notify RN      Comment 2 Documented in Chart     BASIC METABOLIC PANEL     Status: Abnormal   Collection Time   03/15/12 12:09 AM      Component Value Range Comment   Sodium 132 (*) 135 - 145 mEq/L    Potassium 3.3 (*) 3.5 - 5.1 mEq/L    Chloride 100  96 - 112 mEq/L    CO2 20  19 - 32 mEq/L    Glucose, Bld 170 (*) 70 - 99 mg/dL    BUN 72 (*) 6 - 23 mg/dL    Creatinine, Ser 0.96 (*) 0.50 - 1.35 mg/dL    Calcium 7.7 (*) 8.4 - 10.5 mg/dL    GFR calc non Af Amer 8 (*) >90 mL/min    GFR calc Af Amer 10 (*) >90 mL/min   PHOSPHORUS     Status: Abnormal   Collection Time   03/15/12  7:20 AM      Component Value Range Comment   Phosphorus 4.7 (*) 2.3 - 4.6 mg/dL HEMOLYSIS AT THIS LEVEL MAY AFFECT RESULT  MAGNESIUM     Status: Normal   Collection Time   03/15/12  7:20 AM      Component Value Range Comment   Magnesium 1.7  1.5 - 2.5 mg/dL   BASIC METABOLIC PANEL     Status: Abnormal   Collection Time   03/15/12  7:20 AM      Component Value Range Comment   Sodium 135  135 - 145 mEq/L POST-ULTRACENTRIFUGATION    Potassium 3.5  3.5 - 5.1 mEq/L HEMOLYSIS AT THIS LEVEL MAY AFFECT RESULT   Chloride 100  96 - 112 mEq/L    CO2 18 (*) 19 - 32 mEq/L    Glucose, Bld 200 (*) 70 - 99 mg/dL    BUN 75 (*) 6 - 23 mg/dL    Creatinine, Ser 0.45 (*) 0.50 - 1.35 mg/dL    Calcium 7.8 (*) 8.4 - 10.5 mg/dL    GFR calc non Af Amer 8 (*) >90 mL/min    GFR calc Af Amer 9 (*) >90 mL/min   GLUCOSE, CAPILLARY     Status: Abnormal   Collection Time   03/15/12  7:36 AM      Component Value Range Comment   Glucose-Capillary 217 (*) 70 - 99 mg/dL    Comment 1 Notify RN      Comment 2 Documented in Chart      No results found.  Review of Systems  Constitutional: Positive for malaise/fatigue. Negative for fever and weight loss.  Respiratory: Negative for shortness of breath.   Cardiovascular: Negative for chest pain.  Gastrointestinal: Positive for abdominal pain. Negative for nausea and vomiting.  Neurological: Positive for weakness.    Blood pressure 131/78, pulse 83, temperature 98.8 F (37.1 C), temperature source Oral, resp. rate 15, height 5\' 5"  (1.651 m), weight 109 lb 5.6 oz (49.6 kg), SpO2 100.00%. Physical Exam  Constitutional: He is oriented to person, place, and time. He appears well-developed and well-nourished.  Cardiovascular: Normal rate, regular rhythm and normal heart sounds.   No murmur heard. Respiratory: Effort normal and breath sounds normal. He has no wheezes.  GI: Soft. Bowel sounds are normal. There is no tenderness.  Musculoskeletal: Normal range of motion.  Neurological: He is alert and oriented to person, place, and time.  Skin: Skin is warm and dry.  Psychiatric: He has a normal mood and affect. His behavior is normal. Judgment and thought content normal.     Assessment/Plan DKA; ARF On going work up Scheduled for temp cath  Pt aware of procedure benefits and risks and agreeable to proceed Consent signed and in chart  Sandrina Heaton A 03/15/2012, 11:03 AM

## 2012-03-15 NOTE — Procedures (Signed)
R IJ Trialysis catheter placement w/ US/fluoro No complication No blood loss. See complete dictation in [facility].

## 2012-03-16 LAB — CBC
HCT: 23.7 % — ABNORMAL LOW (ref 39.0–52.0)
Hemoglobin: 9.2 g/dL — ABNORMAL LOW (ref 13.0–17.0)
MCH: 31.3 pg (ref 26.0–34.0)
MCHC: 37.4 g/dL — ABNORMAL HIGH (ref 30.0–36.0)
RBC: 2.77 MIL/uL — ABNORMAL LOW (ref 4.22–5.81)

## 2012-03-16 LAB — BASIC METABOLIC PANEL
CO2: 23 mEq/L (ref 19–32)
Calcium: 7.5 mg/dL — ABNORMAL LOW (ref 8.4–10.5)
Chloride: 94 mEq/L — ABNORMAL LOW (ref 96–112)
Glucose, Bld: 588 mg/dL (ref 70–99)
Sodium: 128 mEq/L — ABNORMAL LOW (ref 135–145)

## 2012-03-16 LAB — GLUCOSE, CAPILLARY
Glucose-Capillary: >600 mg/dL (ref 70–99)
Glucose-Capillary: 472 mg/dL — ABNORMAL HIGH (ref 70–99)
Glucose-Capillary: 217 mg/dL — ABNORMAL HIGH (ref 70–99)
Glucose-Capillary: 77 mg/dL (ref 70–99)

## 2012-03-16 LAB — HEPATITIS B CORE ANTIBODY, TOTAL: Hep B Core Total Ab: NEGATIVE

## 2012-03-16 LAB — URINE MICROSCOPIC-ADD ON

## 2012-03-16 LAB — URINALYSIS, ROUTINE W REFLEX MICROSCOPIC
Glucose, UA: >1000 mg/dL — AB
Leukocytes, UA: NEGATIVE
Specific Gravity, Urine: 1.012 (ref 1.005–1.030)
pH: 6 (ref 5.0–8.0)

## 2012-03-16 LAB — PHOSPHORUS: Phosphorus: 3.9 mg/dL (ref 2.3–4.6)

## 2012-03-16 MED ORDER — SIMETHICONE 80 MG PO CHEW
80.0000 mg | CHEWABLE_TABLET | ORAL | Status: AC
  Administered 2012-03-17: 80 mg via ORAL
  Filled 2012-03-16: qty 1

## 2012-03-16 MED ORDER — INSULIN ASPART 100 UNIT/ML ~~LOC~~ SOLN: 10.0000 [IU] | Freq: Once | SUBCUTANEOUS | Status: AC

## 2012-03-16 NOTE — Progress Notes (Signed)
[facility]  Subjective:  Awake, alert, talking with stepfather.  Says HD cath R IJ "not so bad."  First HD yesterday. No N or V   Objective: Vital signs in last 24 hours: Blood pressure 111/58, pulse 89, temperature 98.4 F (36.9 C), temperature source Oral, resp. rate 22, height 5\' 5"  (1.651 m), weight 56.3 kg (124 lb 1.9 oz), SpO2 100.00%.    PHYSICAL EXAM General--as above Chest--cath R IJ OK, clear Heart--no rub Abd--nontender Extr--no edema  I/O yesterday --925 cc urine out + 1000cc UF in HD, weight 56.3 kg today  Lab Results:   Lab 03/16/12 0600 03/15/12 1058 03/15/12 0720 03/14/12 0610  NA 128* 133* 135 --  K 4.6 3.0* 3.5 --  CL 94* 97 100 --  CO2 23 22 18* --  BUN 55* 79* 75* --  CREATININE 6.27* 8.75* 8.52* --  ALB -- -- -- --  GLUCOSE 588* -- -- --  CALCIUM 7.5* 7.8* 7.8* --  PHOS 3.9 -- 4.7* 4.0     Basename 03/16/12 0600 03/14/12 0610  WBC 6.4 9.9  HGB 9.2* 8.8*  HCT 23.7* 24.1*  PLT 176 188   I have reviewed the patient's current medications.  Assessment/Plan: 1. DKA -glucose under poor control (CBG--589 this AM), less acidotic.off IV bicarb.  Per primary service  2. Acute renal failure-etiology is still unclear. He was not hypotensive, he did not receive radiocontrast or NSAID use, urinalysis does not look "active;" urinalysis now shows mod blood and only 3-6 RBCs- CK Not elevated. Avoid needlesticks left forearm in case needed for vascular access.HBsAg negative--dialyzed yesterday.    Vein mapping on Monday--will also check renal artery duplex scan to look for renal artery stenosis/occlusion  3. Anemia-Fe/TIBC 34 %, ferritin 1639--doesn't need IV Fe.  Hgb today 9.2--no aranesp ye   Renal profile, CBC in AM    LOS: 6 days   Markevius Trombetta F 03/16/2012,10:23 AM   .labalb

## 2012-03-16 NOTE — Progress Notes (Signed)
Noted from lab draw blood glucose 588 followed protocol and  Will repeat cbg after am insulin given waiting for MD to return call

## 2012-03-16 NOTE — Progress Notes (Signed)
Pt CBG down to 217 much better . We have monitored pt intake and have done some more diabetic teaching on his diet and skin care, medication and the importance of following up with his MD. Pt states he does not have a Dr. Augustina Mood he lives and missed his meds. He is on Metformin  1000 mg a day and will need more teaching on S/S of his diabetes and his renal concerns very interesting at this time to learn. Marland Kitchen

## 2012-03-16 NOTE — Progress Notes (Signed)
Pt CBG still 589   Monitored his am breakfast 70/30 given this am as ordered

## 2012-03-16 NOTE — Progress Notes (Signed)
Called MD,Rizwan CBG 474 order to give  10 units now

## 2012-03-16 NOTE — Progress Notes (Signed)
Will continue to monitor pt/MD and recheck CBG at lunch and recall of needed

## 2012-03-16 NOTE — Progress Notes (Signed)
[facility] Progress Note [place] TEAM 1 - Stepdown/ICU TEAM   [name] [name] ZOX:[id number] DOB: 1985-06-07 DOA: 03/10/2012 PCP: [name] Oats, MD  Brief narrative: * 26 year old male patient with a recent diagnosis/ history of diabetes presents to [facility] ED with polyuria, polydipsia, headache and fatigue on 10/20.Diagnosed with Diabetic Keto Acidosis. Denies pain, vomiting, vision changes cough,shortness of breath or fever. Pt. states he has had recent weight loss. Pt. takes metformin for his diabetes, and denies missing any medication doses.  Assessment/Plan:  Principal Problem:  *DKA (diabetic ketoacidoses): * Suspect current acidosis is secondary to acute renal failure at this point. * Continue Bicarb Gtt at present, continue monitoring serum bicarb. * * Pt started on a carb modified/ Moderate diet * Transitioned off insulin gtt to 70/30 insulin with sliding scale .  *  70/30 is patient's best option in regard to cost as he is uninsured. Will titrate insulin up today as sugars still uncontrolled- pt decided to eat bread after dinner last night (subway) and therefore sugars were quite uncontrolled this morning.    Active Problems:  Acute Renal Failure: * Appreciate Renal assistance. * Etiology is unclear at present. * Pt was not hypotensive, he did not receive radiocontrast or NSAID use, urinalysis does not look "active;" urinalysis shows large blood and only 3-6 RBCs- CK Not elevated.  * He was started on dialysis on 10/25  *Diabetes Mellitus: * Diet education provided today by Registered Dietician. * Case management consulted and has seen patient. * Pt was told yesterday that we suspect Diabetes is type 1, and not type 2 as originally diagnosed. * Pt. Understands he will need insulin to treat his Diabetes, and not the oral agents he was taking prior to admission. * Pt will need continued teaching, reinforcement, and community resources to maintain his disease  successfully.  Metabolic Encephalopathy: * Appears to be resolved. * Pt. Alert and conversant this morning and asking appropriate questions.  High Blood Pressure associated with Diabetes: *Blood Pressures appear to be within normal range.  * Continue to monitor.   * Anemia: * Anemia panel completed,Fe/TIBC 34 %, ferritin 1639,    per renal does not need IV Fe. * Continue to monitor  Hypertriglyceridemia: * Result obtained 10/21 was > 5000 *Most likely secondary to hyperglycemia. * Pt. Currently treated with insulin gtt for DKA which should assist with treating hypertriglyceridemia. * Abdominal US was negative for Gall Bladder abnormalities and showed no focal pancreatic abnormalities. * Pt. Denies and nausea or vomiting. * States he has feeling of fullness in his left upper quadrant. * Has not had BM since admission. Dulcolax suppository ordered prn. * Recheck value before discharge . * Risk Stratification for CAD.   DVT prophylaxis: *Lovenox 30 mg   Code Status: * Full  Disposition Plan:  Remain in Step Down Unit  Consultants: Renal Diabetes Team Case Management   Procedures: * None  Antibiotics: * None  HPI/Subjective: * Pt is alert and appropriate. He is participating in the conversation and asking appropriate questions. He states he does feel better.    Objective: Blood pressure 127/82, pulse 96, temperature 98.8 F (37.1 C), temperature source Oral, resp. rate 19, height 5\' 5"  (1.651 m), weight 49.6 kg (109 lb 5.6 oz), SpO2 100.00%.  Intake/Output Summary (Last 24 hours) at 03/14/12 1444 Last data filed at 03/14/12 1200  Gross per 24 hour  Intake 1858.75 ml  Output    650 ml  Net 1208.75 ml  Exam: General: No acute respiratory distress Lungs: Clear to auscultation bilaterally without wheezes or crackles Cardiovascular: Regular rate and rhythm without murmur gallop or rub normal S1 and S2 Abdomen: Nontender, nondistended, soft, bowel sounds  positive, no rebound, no ascites, no appreciable mass.  Extremities: No significant cyanosis, clubbing, or edema bilateral lower extremities  Data Reviewed: Basic Metabolic Panel:  Lab 03/14/12 8657 03/14/12 0610 03/14/12 0010 03/13/12 1818 03/13/12 1122 03/13/12 0605  NA 129* 132* 136 131* 133* --  K 4.6 4.2 3.8 4.5 3.4* --  CL 101 106 110 109 109 --  CO2 16* 15* 11* 10* 9* --  GLUCOSE 246* 156* 157* 179* 167* --  BUN 64* 62* 65* 58* 57* --  CREATININE 6.89* 6.91* 6.87* 5.72* 6.26* --  CALCIUM 8.1* 8.0* 8.3* 8.3* 8.4 --  MG -- 1.7 -- -- -- 1.7  PHOS -- 4.0 -- -- -- 2.4   Liver Function Tests:  Lab 03/12/12 0445  AST 79*  ALT 35  ALKPHOS 77  BILITOT 0.2*  PROT 6.1  ALBUMIN 2.4*    Lab 03/12/12 0445 03/11/12 1023 03/10/12 2153  LIPASE 204* -- 62*  AMYLASE -- 115* --   No results found for this basename: AMMONIA:5 in the last 168 hours CBC:  Lab 03/14/12 0610 03/13/12 0605 03/12/12 0445 03/11/12 0101 03/10/12 2009  WBC 9.9 11.4* 13.5* 49.1* 20.8*  NEUTROABS -- -- -- -- --  HGB 8.8* 8.9* 8.7* 10.9* 12.2*  HCT 24.1* 24.9* 23.8* 31.0* 35.2*  MCV 79.5 81.9 82.1 88.3 90.3  PLT 188 165 148* 213 214   Cardiac Enzymes:  Lab 03/14/12 0610 03/13/12 1818  CKTOTAL 62 48  CKMB -- --  CKMBINDEX -- --  TROPONINI -- --   BNP (last 3 results) No results found for this basename: PROBNP:3 in the last 8760 hours CBG:  Lab 03/14/12 1218 03/14/12 1133 03/14/12 1022 03/14/12 0925 03/14/12 0821  GLUCAP 183* 224* 275* 209* 150*    Recent Results (from the past 240 hour(s))  MRSA PCR SCREENING     Status: Normal   Collection Time   03/10/12 11:18 PM      Component Value Range Status Comment   MRSA by PCR NEGATIVE  NEGATIVE Final   URINE CULTURE     Status: Normal   Collection Time   03/11/12  1:57 PM      Component Value Range Status Comment   Specimen Description URINE, CLEAN CATCH   Final    Special Requests NONE   Final    Culture  Setup Time 03/11/2012 15:18   Final      Colony Count 25,000 COLONIES/ML   Final    Culture     Final    Value: Multiple bacterial morphotypes present, none predominant. Suggest appropriate recollection if clinically indicated.   Report Status 03/12/2012 FINAL   Final      Studies:  Recent x-ray studies have been reviewed in detail by the Attending Physician  Scheduled Meds:  Reviewed in detail by the Attending Physician  Calvert Cantor, MD [phone]  On-Call/Text Page:      [email or web address]      password [email or web address]  If 7PM-7AM, please contact night-coverage [email or web address] Password [email or web address] 03/16/2012, 5:40 PM   LOS: 6 days

## 2012-03-17 ENCOUNTER — Encounter (HOSPITAL_COMMUNITY): Payer: Self-pay | Admitting: *Deleted

## 2012-03-17 DIAGNOSIS — N19 Unspecified kidney failure: Secondary | ICD-10-CM

## 2012-03-17 LAB — RENAL FUNCTION PANEL
Albumin: 1.9 g/dL — ABNORMAL LOW (ref 3.5–5.2)
BUN: 69 mg/dL — ABNORMAL HIGH (ref 6–23)
CO2: 23 mEq/L (ref 19–32)
Chloride: 97 mEq/L (ref 96–112)
Creatinine, Ser: 7.46 mg/dL — ABNORMAL HIGH (ref 0.50–1.35)
Glucose, Bld: 70 mg/dL (ref 70–99)
Potassium: 3.3 mEq/L — ABNORMAL LOW (ref 3.5–5.1)

## 2012-03-17 LAB — CBC
Hemoglobin: 7.1 g/dL — ABNORMAL LOW (ref 13.0–17.0)
MCH: 28.3 pg (ref 26.0–34.0)
MCHC: 34.5 g/dL (ref 30.0–36.0)
Platelets: 490 10*3/uL — ABNORMAL HIGH (ref 150–400)
RDW: 16 % — ABNORMAL HIGH (ref 11.5–15.5)

## 2012-03-17 LAB — GLUCOSE, CAPILLARY
Glucose-Capillary: 292 mg/dL — ABNORMAL HIGH (ref 70–99)
Glucose-Capillary: 173 mg/dL — ABNORMAL HIGH (ref 70–99)
Glucose-Capillary: 370 mg/dL — ABNORMAL HIGH (ref 70–99)
Glucose-Capillary: 220 mg/dL — ABNORMAL HIGH (ref 70–99)

## 2012-03-17 MED ORDER — DARBEPOETIN ALFA-POLYSORBATE 150 MCG/0.3ML IJ SOLN
150.0000 ug | INTRAMUSCULAR | Status: DC
  Administered 2012-03-17 (×2): 150 ug via SUBCUTANEOUS
  Filled 2012-03-17 (×2): qty 0.3

## 2012-03-17 MED ORDER — POTASSIUM CHLORIDE CRYS ER 20 MEQ PO TBCR
40.0000 meq | EXTENDED_RELEASE_TABLET | Freq: Two times a day (BID) | ORAL | Status: AC
  Administered 2012-03-17 – 2012-03-18 (×3): 40 meq via ORAL
  Filled 2012-03-17 (×3): qty 2

## 2012-03-17 MED ORDER — INSULIN ASPART 100 UNIT/ML ~~LOC~~ SOLN
5.0000 [IU] | Freq: Three times a day (TID) | SUBCUTANEOUS | Status: DC
  Administered 2012-03-18 (×2): 5 [IU] via SUBCUTANEOUS

## 2012-03-17 NOTE — Progress Notes (Signed)
[facility] Progress Note [place] TEAM 1 - Stepdown/ICU TEAM   [name] [name] ZOX:[id number] DOB: August 03, 1985 DOA: 03/10/2012 PCP: Sheila Oats, MD  Brief narrative: * 26 year old male patient with a recent diagnosis/ history of diabetes presents to [facility] ED with polyuria, polydipsia, headache and fatigue on 10/20.Diagnosed with Diabetic Keto Acidosis. Denies pain, vomiting, vision changes cough,shortness of breath or fever. Pt. states he has had recent weight loss. Pt. takes metformin for his diabetes, and denies missing any medication doses.  Assessment/Plan:  Principal Problem:  *DKA (diabetic ketoacidoses): * resolved * Transitioned off insulin gtt to 70/30 insulin with sliding scale -   *  70/30 is patient's best option in regard to cost as he is uninsured. Will titrate insulin up today as sugars still uncontrolled  Active Problems:  Acute Renal Failure: * Appreciate Renal assistance. * Etiology is unclear at present. * Pt was not hypotensive, he did not receive radiocontrast or NSAID use, urinalysis does not look "active;" urinalysis shows large blood and only 3-6 RBCs- CK Not elevated.  * He was started on dialysis on 10/25  *Diabetes Mellitus: * Diet education provided today by Registered Dietician. * Case management consulted and has seen patient. * Pt was told that we suspect Diabetes is type 1, and not type 2 as originally diagnosed. * Pt. Understands he will need insulin to treat his Diabetes, and not the oral agents he was taking prior to admission. * Pt will need continued teaching, reinforcement, and community resources to maintain his disease successfully.  Metabolic Encephalopathy: * Appears to be resolved. * Pt. Alert and conversant this morning and asking appropriate questions.  High Blood Pressure associated with Diabetes: *Blood Pressures appear to be within normal range.  * Continue to monitor.   * Anemia: * Anemia panel completed,Fe/TIBC 34  %, ferritin 1639,    per renal does not need IV Fe. * Continue to monitor  Hypertriglyceridemia: * Result obtained 10/21 was > 5000 *Most likely secondary to hyperglycemia. * Pt. Currently treated with insulin gtt for DKA which should assist with treating hypertriglyceridemia. * Abdominal US was negative for Gall Bladder abnormalities and showed no focal pancreatic abnormalities. * Pt. Denies and nausea or vomiting. * States he has feeling of fullness in his left upper quadrant. * Has not had BM since admission. Dulcolax suppository ordered prn. * Recheck value before discharge . * Risk Stratification for CAD.   DVT prophylaxis: *Lovenox 30 mg   Code Status: * Full  Disposition Plan:  Remain in Step Down Unit  Consultants: Renal Diabetes Team Case Management   Procedures: * None  Antibiotics: * None  HPI/Subjective: Pt is laying in bed - he has no complaints.     Objective: Blood pressure 127/82, pulse 96, temperature 98.8 F (37.1 C), temperature source Oral, resp. rate 19, height 5\' 5"  (1.651 m), weight 49.6 kg (109 lb 5.6 oz), SpO2 100.00%.  Intake/Output Summary (Last 24 hours) at 03/14/12 1444 Last data filed at 03/14/12 1200  Gross per 24 hour  Intake 1858.75 ml  Output    650 ml  Net 1208.75 ml     Exam: General: No acute respiratory distress Lungs: Clear to auscultation bilaterally without wheezes or crackles Cardiovascular: Regular rate and rhythm without murmur gallop or rub normal S1 and S2 Abdomen: Nontender, nondistended, soft, bowel sounds positive, no rebound, no ascites, no appreciable mass.  Extremities: No significant cyanosis, clubbing, or edema bilateral lower extremities  Data Reviewed: Basic Metabolic Panel:  Lab  03/14/12 1104 03/14/12 0610 03/14/12 0010 03/13/12 1818 03/13/12 1122 03/13/12 0605  NA 129* 132* 136 131* 133* --  K 4.6 4.2 3.8 4.5 3.4* --  CL 101 106 110 109 109 --  CO2 16* 15* 11* 10* 9* --  GLUCOSE 246* 156*  157* 179* 167* --  BUN 64* 62* 65* 58* 57* --  CREATININE 6.89* 6.91* 6.87* 5.72* 6.26* --  CALCIUM 8.1* 8.0* 8.3* 8.3* 8.4 --  MG -- 1.7 -- -- -- 1.7  PHOS -- 4.0 -- -- -- 2.4   Liver Function Tests:  Lab 03/12/12 0445  AST 79*  ALT 35  ALKPHOS 77  BILITOT 0.2*  PROT 6.1  ALBUMIN 2.4*    Lab 03/12/12 0445 03/11/12 1023 03/10/12 2153  LIPASE 204* -- 62*  AMYLASE -- 115* --   No results found for this basename: AMMONIA:5 in the last 168 hours CBC:  Lab 03/14/12 0610 03/13/12 0605 03/12/12 0445 03/11/12 0101 03/10/12 2009  WBC 9.9 11.4* 13.5* 49.1* 20.8*  NEUTROABS -- -- -- -- --  HGB 8.8* 8.9* 8.7* 10.9* 12.2*  HCT 24.1* 24.9* 23.8* 31.0* 35.2*  MCV 79.5 81.9 82.1 88.3 90.3  PLT 188 165 148* 213 214   Cardiac Enzymes:  Lab 03/14/12 0610 03/13/12 1818  CKTOTAL 62 48  CKMB -- --  CKMBINDEX -- --  TROPONINI -- --   BNP (last 3 results) No results found for this basename: PROBNP:3 in the last 8760 hours CBG:  Lab 03/14/12 1218 03/14/12 1133 03/14/12 1022 03/14/12 0925 03/14/12 0821  GLUCAP 183* 224* 275* 209* 150*    Recent Results (from the past 240 hour(s))  MRSA PCR SCREENING     Status: Normal   Collection Time   03/10/12 11:18 PM      Component Value Range Status Comment   MRSA by PCR NEGATIVE  NEGATIVE Final   URINE CULTURE     Status: Normal   Collection Time   03/11/12  1:57 PM      Component Value Range Status Comment   Specimen Description URINE, CLEAN CATCH   Final    Special Requests NONE   Final    Culture  Setup Time 03/11/2012 15:18   Final    Colony Count 25,000 COLONIES/ML   Final    Culture     Final    Value: Multiple bacterial morphotypes present, none predominant. Suggest appropriate recollection if clinically indicated.   Report Status 03/12/2012 FINAL   Final      Studies:  Recent x-ray studies have been reviewed in detail by the Attending Physician  Scheduled Meds:  Reviewed in detail by the Attending Physician  Calvert Cantor, MD [phone]  On-Call/Text Page:      [email or web address]      password [email or web address]  If 7PM-7AM, please contact night-coverage [email or web address] Password [email or web address] 03/17/2012, 9:13 PM   LOS: 7 days

## 2012-03-17 NOTE — Progress Notes (Signed)
*  PRELIMINARY RESULTS* Vascular Ultrasound Renal Artery Duplex has been completed.   There is evidence of elevated right renal artery velocities, suggestive of 40-59% stenosis. The left renal artery demonstrates no obvious evidence of hemodynamically significant renal artery stenosis. The celiac axis and superior mesenteric artery demonstrate elevated velocities without obvious evidence of stenosis; suggestive of possible extrinsic compression. Bilateral kidneys demonstrate calyceal fullness. Bilateral intrarenal and extrarenal resistive indices are elevated.  03/17/2012 4:07 PM Gertie Fey, RDMS, RDCS

## 2012-03-17 NOTE — Progress Notes (Signed)
[facility]  Subjective:  Awake, alert, feels ok.  Spends most of time in bed Dialyzed on 25 Oct  Objective: Vital signs in last 24 hours: Blood pressure 128/69, pulse 86, temperature 98.5 F (36.9 C), temperature source Oral, resp. rate 15, height 5\' 5"  (1.651 m), weight 57.9 kg (127 lb 10.3 oz), SpO2 98.00%.    PHYSICAL EXAM General--as above  Chest--cath R IJ OK, clear  Heart--no rub  Abd--nontender  Extr--no edema  I/O yesterday--600/800  Lab Results:   Lab 03/17/12 0505 03/16/12 0600 03/15/12 1058 03/15/12 0720  NA 135 128* 133* --  K 3.3* 4.6 3.0* --  CL 97 94* 97 --  CO2 23 23 22  --  BUN 69* 55* 79* --  CREATININE 7.46* 6.27* 8.75* --  ALB -- -- -- --  GLUCOSE 70 -- -- --  CALCIUM 8.1* 7.5* 7.8* --  PHOS 5.5* 3.9 -- 4.7*     Basename 03/17/12 0505 03/16/12 0600  WBC 7.6 6.4  HGB 7.1* 9.2*  HCT 20.6* 23.7*  PLT 490* 176   I have reviewed the patient's current medications.  Assessment/Plan: 1. DKA -glucose still not under good control (CBG--292 this AM), less acidotic.off IV bicarb. Per primary service  2. Acute renal failure-etiology is still unclear. He was not hypotensive, he did not receive radiocontrast or report NSAID use, urinalysis does not look "active;" urinalysis shows mod blood and only 3-6 RBCs; CK Not elevated. Avoid needlesticks left forearm in case needed for vascular access.HBsAg negative--dialyzed 25 Oct. Vein mapping on Monday-- renal artery duplex scan today to  look for renal artery stenosis/occlusion  3. Anemia-Fe/TIBC 34 %, ferritin 1639--doesn't need IV Fe. Hgb today 7.1 (was 9.2 yesterday)--check hemocult, begin Aranesp, type and screen     LOS: 7 days   Tishara Pizano F 03/17/2012,9:51 AM   .[email or web address]

## 2012-03-17 NOTE — Progress Notes (Signed)
Pt report called to [phone], RN. VSS. All meds currrent.

## 2012-03-18 DIAGNOSIS — Z01810 Encounter for preprocedural cardiovascular examination: Secondary | ICD-10-CM

## 2012-03-18 LAB — RENAL FUNCTION PANEL
Albumin: 1.9 g/dL — ABNORMAL LOW (ref 3.5–5.2)
CO2: 22 mEq/L (ref 19–32)
Chloride: 102 mEq/L (ref 96–112)
Creatinine, Ser: 7.9 mg/dL — ABNORMAL HIGH (ref 0.50–1.35)
GFR calc Af Amer: 10 mL/min — ABNORMAL LOW (ref >90)
GFR calc non Af Amer: 8 mL/min — ABNORMAL LOW (ref >90)
Sodium: 135 mEq/L (ref 135–145)

## 2012-03-18 LAB — GLUCOSE, CAPILLARY
Glucose-Capillary: 172 mg/dL — ABNORMAL HIGH (ref 70–99)
Glucose-Capillary: 148 mg/dL — ABNORMAL HIGH (ref 70–99)

## 2012-03-18 LAB — URINALYSIS, ROUTINE W REFLEX MICROSCOPIC
Nitrite: NEGATIVE
Specific Gravity, Urine: 1.009 (ref 1.005–1.030)
Urobilinogen, UA: 0.2 mg/dL (ref 0.0–1.0)
pH: 5.5 (ref 5.0–8.0)

## 2012-03-18 LAB — CBC
HCT: 21.9 % — ABNORMAL LOW (ref 39.0–52.0)
MCHC: 35.6 g/dL (ref 30.0–36.0)
MCV: 81.4 fL (ref 78.0–100.0)
Platelets: 256 10*3/uL (ref 150–400)
RDW: 14.8 % (ref 11.5–15.5)
WBC: 6.7 10*3/uL (ref 4.0–10.5)

## 2012-03-18 LAB — TYPE AND SCREEN: Antibody Screen: NEGATIVE

## 2012-03-18 LAB — ABO/RH: ABO/RH(D): O POS

## 2012-03-18 MED ORDER — INSULIN ASPART PROT & ASPART (70-30 MIX) 100 UNIT/ML ~~LOC~~ SUSP: 28.0000 [IU] | Freq: Every day | SUBCUTANEOUS | Status: DC

## 2012-03-18 MED ORDER — INSULIN ASPART PROT & ASPART (70-30 MIX) 100 UNIT/ML ~~LOC~~ SUSP
30.0000 [IU] | Freq: Every day | SUBCUTANEOUS | Status: DC
  Administered 2012-03-18 – 2012-03-20 (×3): 30 [IU] via SUBCUTANEOUS
  Filled 2012-03-18: qty 3

## 2012-03-18 MED FILL — Heparin Sodium (Porcine) Inj 1000 Unit/ML: INTRAMUSCULAR | Qty: 10 | Status: AC

## 2012-03-18 NOTE — Progress Notes (Signed)
Pt educated on insulin administration. Return demonstration x 3 today with self injection.

## 2012-03-18 NOTE — Progress Notes (Signed)
[facility] Progress Note [place] TEAM 1 - Stepdown/ICU TEAM   [name] [name] YNW:[id number] DOB: Nov 20, 1985 DOA: 03/10/2012  Brief narrative: * 26 year old male patient with a recent diagnosis/ history of diabetes presents to [facility] ED with polyuria, polydipsia, headache and fatigue on 10/20.Diagnosed with Diabetic Keto Acidosis. Denies pain, vomiting, vision changes cough,shortness of breath or fever. Pt. states he has had recent weight loss. Pt. takes metformin for his diabetes, and denies missing any medication doses.  Assessment/Plan:  Principal Problem:  *DKA (diabetic ketoacidoses): - Patient started on admission on iv insulin and iv fluids  * Transitioned off insulin gtt to 70/30 insulin  -   *  70/30 is patient's best option in regard to cost as he is uninsured.    Acute Renal Failure: * Appreciate Renal assistance. * Etiology is unclear at present. * Pt was not hypotensive, he did not receive radiocontrast or NSAID use, urinalysis does not look "active;" urinalysis shows large blood and only 3-6 RBCs- CK Not elevated.  * He was started on dialysis on 10/25  *Diabetes Mellitus: * Diet education provide by Registered Dietician. * Case management consulted and has seen patient. * Pt was told that we suspect Diabetes is type 1, and not type 2 as originally diagnosed. * Pt. Understands he will need insulin to treat his Diabetes, and not the oral agents he was taking prior to admission. * Pt will need continued teaching, reinforcement, and community resources to maintain his disease successfully.  Metabolic Encephalopathy: * Appears to be resolved. * Pt. Alert and conversant this morning and asking appropriate questions.  High Blood Pressure associated with Diabetes: *Blood Pressures appear to be within normal range.  * Continue to monitor.   * Anemia: * Anemia panel completed,Fe/TIBC 34 %, ferritin 1639,    per renal does not need IV Fe. * Continue to  monitor  Hypertriglyceridemia: * Result obtained 10/21 was > 5000 *Most likely secondary to uncontrolled DM  * Pt. Currently treated with insulin gtt for DKA which should assist with treating hypertriglyceridemia. * Abdominal US was negative for Gall Bladder abnormalities and showed no focal pancreatic abnormalities. * Pt. Denies and nausea or vomiting. * States he has feeling of fullness in his left upper quadrant. * Recheck value before discharge . * Risk Stratification for CAD.   DVT prophylaxis: *Lovenox 30 mg   Code Status: * Full  Disposition Plan:  Unclear - depends on outpt HD needs   Consultants: Renal    Procedures: HD  Antibiotics: * None  HPI/Subjective:  No complains   Objective: Blood pressure 127/82, pulse 96, temperature 98.8 F (37.1 C), temperature source Oral, resp. rate 19, height 5\' 5"  (1.651 m), weight 49.6 kg (109 lb 5.6 oz), SpO2 100.00%.  Intake/Output Summary (Last 24 hours) at 03/14/12 1444 Last data filed at 03/14/12 1200  Gross per 24 hour  Intake 1858.75 ml  Output    650 ml  Net 1208.75 ml    Patient Vitals for the past 24 hrs:  BP Temp Temp src Pulse Resp SpO2 Height Weight  03/18/12 0624 125/67 mmHg 98.4 F (36.9 C) Oral 77  17  100 % - -  03/17/12 1947 125/88 mmHg 98.7 F (37.1 C) Oral 82  17  100 % 5\' 5"  (1.651 m) 57.9 kg (127 lb 10.3 oz)  03/17/12 1638 126/66 mmHg 98.3 F (36.8 C) Oral - - - - -  03/17/12 1544 - - Oral - - 99 % - -  03/17/12 1226 131/70 mmHg 98.3 F (36.8 C) Oral 77  17  99 % - -     Exam: General: No acute respiratory distress Lungs: Clear to auscultation bilaterally without wheezes or crackles Cardiovascular: Regular rate and rhythm without murmur gallop or rub normal S1 and S2 Abdomen: Nontender, nondistended, soft, bowel sounds positive, no rebound, no ascites, no appreciable mass.    Data Reviewed: Basic Metabolic Panel:  Lab 03/14/12 1610 03/14/12 0610 03/14/12 0010 03/13/12 1818  03/13/12 1122 03/13/12 0605  NA 129* 132* 136 131* 133* --  K 4.6 4.2 3.8 4.5 3.4* --  CL 101 106 110 109 109 --  CO2 16* 15* 11* 10* 9* --  GLUCOSE 246* 156* 157* 179* 167* --  BUN 64* 62* 65* 58* 57* --  CREATININE 6.89* 6.91* 6.87* 5.72* 6.26* --  CALCIUM 8.1* 8.0* 8.3* 8.3* 8.4 --  MG -- 1.7 -- -- -- 1.7  PHOS -- 4.0 -- -- -- 2.4   Liver Function Tests:  Lab 03/12/12 0445  AST 79*  ALT 35  ALKPHOS 77  BILITOT 0.2*  PROT 6.1  ALBUMIN 2.4*    Lab 03/12/12 0445 03/11/12 1023 03/10/12 2153  LIPASE 204* -- 62*  AMYLASE -- 115* --   No results found for this basename: AMMONIA:5 in the last 168 hours CBC:  Lab 03/14/12 0610 03/13/12 0605 03/12/12 0445 03/11/12 0101 03/10/12 2009  WBC 9.9 11.4* 13.5* 49.1* 20.8*  NEUTROABS -- -- -- -- --  HGB 8.8* 8.9* 8.7* 10.9* 12.2*  HCT 24.1* 24.9* 23.8* 31.0* 35.2*  MCV 79.5 81.9 82.1 88.3 90.3  PLT 188 165 148* 213 214   Cardiac Enzymes:  Lab 03/14/12 0610 03/13/12 1818  CKTOTAL 62 48  CKMB -- --  CKMBINDEX -- --  TROPONINI -- --   BNP (last 3 results) No results found for this basename: PROBNP:3 in the last 8760 hours CBG:  Lab 03/14/12 1218 03/14/12 1133 03/14/12 1022 03/14/12 0925 03/14/12 0821  GLUCAP 183* 224* 275* 209* 150*    Recent Results (from the past 240 hour(s))  MRSA PCR SCREENING     Status: Normal   Collection Time   03/10/12 11:18 PM      Component Value Range Status Comment   MRSA by PCR NEGATIVE  NEGATIVE Final   URINE CULTURE     Status: Normal   Collection Time   03/11/12  1:57 PM      Component Value Range Status Comment   Specimen Description URINE, CLEAN CATCH   Final    Special Requests NONE   Final    Culture  Setup Time 03/11/2012 15:18   Final    Colony Count 25,000 COLONIES/ML   Final    Culture     Final    Value: Multiple bacterial morphotypes present, none predominant. Suggest appropriate recollection if clinically indicated.   Report Status 03/12/2012 FINAL   Final       Studies:  Recent x-ray studies have been reviewed in detail by the Attending Physician  Scheduled Meds:     . bd getting started take home kit  1 kit Other Once  . darbepoetin (ARANESP) injection - NON-DIALYSIS  150 mcg Subcutaneous Q Sun-1800  . enoxaparin  30 mg Subcutaneous Daily  . insulin aspart  0-5 Units Subcutaneous QHS  . insulin aspart  5 Units Subcutaneous TID WC  . insulin aspart protamine-insulin aspart  28 Units Subcutaneous Q supper  . insulin aspart protamine-insulin aspart  30 Units  Subcutaneous Q breakfast  . potassium chloride  40 mEq Oral BID  . DISCONTD: insulin aspart  0-15 Units Subcutaneous TID WC  . DISCONTD: insulin aspart  0-5 Units Subcutaneous QHS  . DISCONTD: insulin aspart protamine-insulin aspart  25 Units Subcutaneous Q supper     [name] [name] [phone]   On-Call/Text Page:      [email or web address]      password [email or web address]  If 7PM-7AM, please contact night-coverage [email or web address] Password [email or web address] 03/18/2012, 8:26 AM   LOS: 8 days

## 2012-03-18 NOTE — Progress Notes (Signed)
Inpatient Diabetes Program Recommendations  AACE/ADA: New Consensus Statement on Inpatient Glycemic Control (2013)  Target Ranges:  Prepandial:   less than 140 mg/dL      Peak postprandial:   less than 180 mg/dL (1-2 hours)      Critically ill patients:  140 - 180 mg/dL   Results for MUSSA, MCQUAIG (MRN [id number]) as of 03/18/2012 13:22  Ref. Range 03/17/2012 16:36 03/17/2012 19:42 03/17/2012 21:34 03/18/2012 01:59 03/18/2012 08:01 03/18/2012 12:22  Glucose-Capillary Latest Range: 70-99 mg/dL 409 (H) 811 (H) 914 (H) 147 (H) 168 (H) 172 (H)    Inpatient Diabetes Program Recommendations Correction (SSI): Since patient is on 70/30, please consider resuming correction sensitive scale ACHS. Insulin - Meal Coverage: Please consider discontinuing the Novolog meal coverage since pt on 70/30 it is not recommended to give the patient both 70/30 and meal coverage. HgbA1C: A1C is 13.9%.  Note: Patient is receiving 70/30 30 units with breakfast and 28 units with supper.  It is not recommended that meal coverage be given when patient is on 70/30.  In looking back at the [facility], it appears that patient did not receive 70/30 with breakfast on 03/17/2012 and therefore, blood sugars were more elevated yesterday.  Blood glucose ranging from 139-172 over the last 12 hours.  Will continue to follow. Thanks, Orlando Penner, RN, BSN, CCRN Diabetes Coordinator Inpatient Diabetes Program [phone]

## 2012-03-18 NOTE — Progress Notes (Signed)
VASCULAR LAB PRELIMINARY  PRELIMINARY  PRELIMINARY  PRELIMINARY  Right  Upper Extremity Vein Map    Cephalic  Segment Diameter Depth Comment  1. Axilla 1.11mm mm   2. Mid upper arm 2.61mm mm   3. Above AC mm mm thrombosed  4. In AC mm mm thrombosed  5. Below AC mm mm Too small  6. Mid forearm mm mm Too small  7. Wrist mm mm Too small   mm mm    mm mm    mm mm    Basilic  Segment Diameter Depth Comment  1. Axilla mm mm   2. Mid upper arm 2.19mm 7.55mm   3. Above AC 2mm 4mm   4. In AC mm mm Too small  5. Below AC mm mm Too small  6. Mid forearm mm mm   7. Wrist mm mm    mm mm    mm mm    mm mm    Left Upper Extremity Vein Map    Cephalic  Segment Diameter Depth Comment  1. Axilla 0.64mm mm   2. Mid upper arm 0.50mm mm   3. Above AC 0.23mm mm   4. In AC 1.70mm mm   5. Below AC 1.14mm mm   6. Mid forearm 0.75mm mm   7. Wrist 0.85mm mm    mm mm    mm mm    mm mm    Basilic  Segment Diameter Depth Comment  1. Axilla mm mm   2. Mid upper arm mm mm   3. Above AC 0.64mm 0.53mm   4. In AC 2mm 3.63mm   5. Below AC 0.24mm mm   6. Mid forearm mm mm   7. Wrist mm mm    mm mm    mm mm    mm mm    Lateral branch of the brachial vein  See images  Segment Diameter Depth Comment  1. Axilla mm mm   2. Mid upper arm 2.77mm 10.11mm   3. Above AC 2.63mm 9.22mm   4. In [facility] 2.37mm 10.50mm   5. Below AC mm mm Joins the ulnar veins  6. Mid forearm mm mm   7. Wrist mm mm    mm mm    mm mm    mm mm    [name], RVT 03/18/2012, 4:05 PM

## 2012-03-18 NOTE — Progress Notes (Signed)
Physical Therapy Evaluation Patient Details Name: [name] [name] MRN: [id number] DOB: 05-09-1986 Today's Date: 03/18/2012 Time: 4098-1191 PT Time Calculation (min): 10 min  PT Assessment / Plan / Recommendation Clinical Impression  26 yo male admitted with DKA, renal dysfunction requiring HD; medically improving, presents to PT as independent with all mobility; No PT needs identified at this time; Will sign off  Discussed importance of taking control of diabetes, and empowering self to keep tight control of blood sugar    PT Assessment  Patent does not need any further PT services    Follow Up Recommendations  No PT follow up    Does the patient have the potential to tolerate intense rehabilitation      Barriers to Discharge        Equipment Recommendations  None recommended by PT    Recommendations for Other Services  (Continuing diabetes education)   Frequency      Precautions / Restrictions Precautions Precautions: None   Pertinent Vitals/Pain no apparent distress       Mobility  Bed Mobility Bed Mobility: Supine to Sit Supine to Sit: 6: Modified independent (Device/Increase time) Transfers Transfers: Sit to Stand;Stand to Sit Sit to Stand: 6: Modified independent (Device/Increase time) Stand to Sit: 6: Modified independent (Device/Increase time) Details for Transfer Assistance: Smooth transitions Ambulation/Gait Ambulation/Gait Assistance: 7: Independent Ambulation Distance (Feet): 200 Feet Assistive device: None Ambulation/Gait Assistance Details: No problems with gait; Educated pt to self-monitor for acitivity tolerance, especailly on HD days; Initially somewhat slow, but then gait speed and pattern normalized Gait Pattern: Within Functional Limits    Shoulder Instructions     Exercises     PT Diagnosis:    PT Problem List:   PT Treatment Interventions:     PT Goals    Visit Information  Last PT Received On: 03/18/12 Assistance Needed: +1      Subjective Data  Subjective: Agreeable to amb; Reports this hospitalization has been a wake up call, and he knows how seriously he must take his diabetes Patient Stated Goal: take control   Prior Functioning  Home Living Lives With:  (Sister) Available Help at Discharge: Family;Available PRN/intermittently Type of Home: Apartment Home Access: Level entry Home Layout: Two level;Able to live on main level with bedroom/bathroom Home Adaptive Equipment: None Prior Function Level of Independence: Independent Able to Take Stairs?: Yes Driving: Yes Communication Communication: No difficulties    Cognition  Overall Cognitive Status: Appears within functional limits for tasks assessed/performed Arousal/Alertness: Awake/alert Orientation Level: Appears intact for tasks assessed Behavior During Session: [name] [name] for tasks performed    Extremity/Trunk Assessment Right Upper Extremity Assessment RUE ROM/Strength/Tone: Within functional levels Left Upper Extremity Assessment LUE ROM/Strength/Tone: Within functional levels Right Lower Extremity Assessment RLE ROM/Strength/Tone: Within functional levels Left Lower Extremity Assessment LLE ROM/Strength/Tone: Within functional levels Trunk Assessment Trunk Assessment: Normal   Balance    End of Session PT - End of Session Activity Tolerance: Patient tolerated treatment well Patient left: in bed;with call bell/phone within reach (sitting EOB, about to eat breakfast) Nurse Communication: Mobility status  GP     [name] [name] [name] [name] [name] [name], [name] [name] [phone]  03/18/2012, 8:55 AM

## 2012-03-18 NOTE — Progress Notes (Signed)
Subjective: Interval History: none.  Objective: Vital signs in last 24 hours: Temp:  [98.3 F (36.8 C)-98.7 F (37.1 C)] 98.4 F (36.9 C) (10/28 0624) Pulse Rate:  [77-82] 77  (10/28 0624) Resp:  [17] 17  (10/28 0624) BP: (125-131)/(66-88) 125/67 mmHg (10/28 0624) SpO2:  [99 %-100 %] 100 % (10/28 0624) Weight:  [57.9 kg (127 lb 10.3 oz)] 57.9 kg (127 lb 10.3 oz) (10/27 1947) Weight change: 0 kg (0 lb)  Intake/Output from previous day: 10/27 0701 - 10/28 0700 In: 1320 [P.O.:1320] Out: 2 [Urine:1; Stool:1] Intake/Output this shift:    General appearance: alert and cooperative Resp: clear to auscultation bilaterally Chest wall: no tenderness, RIJ cath Cardio: S1, S2 normal GI: soft, non-tender; bowel sounds normal; no masses,  no organomegaly Extremities: extremities normal, atraumatic, no cyanosis or edema  Lab Results:  [facility] 03/18/12 0635 03/17/12 0505  WBC 6.7 7.6  HGB 7.8* 7.1*  HCT 21.9* 20.6*  PLT 256 490*   BMET:  Basename 03/18/12 0635 03/17/12 0505  NA 135 135  K 4.1 3.3*  CL 102 97  CO2 22 23  GLUCOSE 139* 70  BUN 74* 69*  CREATININE 7.90* 7.46*  CALCIUM 8.1* 8.1*   No results found for this basename: PTH:2 in the last 72 hours Iron Studies: No results found for this basename: IRON,TIBC,TRANSFERRIN,FERRITIN in the last 72 hours  Studies/Results: No results found.  I have reviewed the patient's current medications.  Assessment/Plan: 1 AKI nonoliguric, but not recovering function yet.  Will follow today and if worse in am do HD Mild acidemia.  Suspect osmotic injury 2 DM per primary  3 Anemia stable , follow P follow Cr, diet, bs control  LOS: 8 days   [place] [name] [name] 03/18/2012,12:02 PM

## 2012-03-18 NOTE — Progress Notes (Signed)
Utilization review completed.  

## 2012-03-19 LAB — GLUCOSE, CAPILLARY
Glucose-Capillary: 94 mg/dL (ref 70–99)
Glucose-Capillary: 96 mg/dL (ref 70–99)
Glucose-Capillary: 76 mg/dL (ref 70–99)
Glucose-Capillary: 148 mg/dL — ABNORMAL HIGH (ref 70–99)
Glucose-Capillary: 64 mg/dL — ABNORMAL LOW (ref 70–99)

## 2012-03-19 LAB — RENAL FUNCTION PANEL
BUN: 74 mg/dL — ABNORMAL HIGH (ref 6–23)
Calcium: 8.4 mg/dL (ref 8.4–10.5)
Creatinine, Ser: 7.45 mg/dL — ABNORMAL HIGH (ref 0.50–1.35)
Glucose, Bld: 93 mg/dL (ref 70–99)
Phosphorus: 4.7 mg/dL — ABNORMAL HIGH (ref 2.3–4.6)

## 2012-03-19 LAB — CBC
HCT: 22.1 % — ABNORMAL LOW (ref 39.0–52.0)
Hemoglobin: 7.5 g/dL — ABNORMAL LOW (ref 13.0–17.0)
MCH: 28.5 pg (ref 26.0–34.0)
MCHC: 33.9 g/dL (ref 30.0–36.0)

## 2012-03-19 NOTE — Progress Notes (Signed)
Subjective: Interval History: none.  Objective: Vital signs in last 24 hours: Temp:  [97.8 F (36.6 C)-98.9 F (37.2 C)] 98.9 F (37.2 C) (10/29 0448) Pulse Rate:  [77-82] 77  (10/29 0448) Resp:  [18-20] 18  (10/29 0448) BP: (112-128)/(68-77) 113/68 mmHg (10/29 0448) SpO2:  [99 %-100 %] 100 % (10/29 0448) Weight:  [57.9 kg (127 lb 10.3 oz)] 57.9 kg (127 lb 10.3 oz) (10/28 2108) Weight change: 0 kg (0 lb)  Intake/Output from previous day: 10/28 0701 - 10/29 0700 In: 960 [P.O.:960] Out: -  Intake/Output this shift: Total I/O In: 240 [P.O.:240] Out: 1 [Urine:1]  General appearance: alert and cooperative Resp: clear to auscultation bilaterally Cardio: S1, S2 normal and systolic murmur: holosystolic 2/6, blowing at apex GI: liver down 5 cm, pos bs, soft Extremities: extremities normal, atraumatic, no cyanosis or edema  Lab Results:  Basename 03/19/12 0500 03/18/12 0635  WBC 7.4 6.7  HGB 7.5* 7.8*  HCT 22.1* 21.9*  PLT 250 256   BMET:  Basename 03/19/12 0500 03/18/12 0635  NA 140 135  K 4.4 4.1  CL 105 102  CO2 22 22  GLUCOSE 93 139*  BUN 74* 74*  CREATININE 7.45* 7.90*  CALCIUM 8.4 8.1*   No results found for this basename: PTH:2 in the last 72 hours Iron Studies: No results found for this basename: IRON,TIBC,TRANSFERRIN,FERRITIN in the last 72 hours  Studies/Results: No results found.  I have reviewed the patient's current medications.  Assessment/Plan: 1 AKI osmotic ATN, benign urine.  Making fair amount, unmeasured. 2 Anemia stable on epo, fe ok 3 Dm improving 4 enceph resolved P follow chem, follow urine vol    LOS: 9 days   [name] [name] L 03/19/2012,10:28 AM

## 2012-03-19 NOTE — Progress Notes (Signed)
[facility] Progress Note    Santi Troung NGE:[id number] DOB: Nov 27, 1985 DOA: 03/10/2012  Brief narrative: * 26 year old male patient with a recent diagnosis/ history of diabetes presents to [facility] ED with polyuria, polydipsia, headache and fatigue on 10/20.Diagnosed with Diabetic Keto Acidosis. Denies pain, vomiting, vision changes cough,shortness of breath or fever. Pt. states he has had recent weight loss. Pt. takes metformin for his diabetes, and denies missing any medication doses.  Assessment/Plan:  Principal Problem:  *DKA (diabetic ketoacidoses): - Patient started on admission on iv insulin and iv fluids and his DKA corrected  * Transitioned off insulin gtt to 70/30 insulin  -   *  70/30 is patient's best option in regard to cost as he is uninsured.     Acute Renal Failure: * Appreciate Renal assistance. * Etiology is unclear at present. * Pt was not hypotensive, he did not receive radiocontrast or NSAID use, urinalysis does not look "active;" urinalysis shows large blood and only 3-6 RBCs- CK Not elevated.  * He was started on dialysis on 10/25  *Diabetes Mellitus: * Diet education provide by Registered Dietician. * Pt was told that we suspect Diabetes is type 1, and not type 2 as originally diagnosed. * Pt. Understands he will need insulin to treat his Diabetes, and not the oral agents he was taking prior to admission. * Pt will need continued teaching, reinforcement, and community resources to maintain his disease successfully.  Metabolic Encephalopathy:  resolved.   * Anemia: * Anemia panel completed,Fe/TIBC 34 %, ferritin 1639,    per renal does not need IV Fe. * Continue to monitor  Hypertriglyceridemia: * Result obtained 10/21 was > 5000 *Most likely secondary to uncontrolled DM  * Abdominal US was negative for Gall Bladder abnormalities and showed no focal pancreatic abnormalities.    DVT prophylaxis: *Lovenox 30 mg   Code Status: * Full    Disposition Plan:  Unclear - depends on outpt HD needs   Consultants: Renal    Procedures: HD  Antibiotics: * None  HPI/Subjective:  No complains   Objective: Blood pressure 127/82, pulse 96, temperature 98.8 F (37.1 C), temperature source Oral, resp. rate 19, height 5\' 5"  (1.651 m), weight 49.6 kg (109 lb 5.6 oz), SpO2 100.00%.  Intake/Output Summary (Last 24 hours) at 03/14/12 1444 Last data filed at 03/14/12 1200  Gross per 24 hour  Intake 1858.75 ml  Output    650 ml  Net 1208.75 ml    Patient Vitals for the past 24 hrs:  BP Temp Temp src Pulse Resp SpO2 Height Weight  03/19/12 1327 120/77 mmHg 97.8 F (36.6 C) Oral 78  18  98 % - -  03/19/12 1000 119/74 mmHg 98.2 F (36.8 C) Oral 82  18  100 % - -  03/19/12 0448 113/68 mmHg 98.9 F (37.2 C) Oral 77  18  100 % - -  03/18/12 2108 128/77 mmHg 97.8 F (36.6 C) Oral 82  18  100 % 5\' 5"  (1.651 m) 57.9 kg (127 lb 10.3 oz)  03/18/12 1800 112/74 mmHg 98.4 F (36.9 C) Oral 78  20  99 % - -     Exam: General: No acute respiratory distress Lungs: Clear to auscultation bilaterally without wheezes or crackles Cardiovascular: Regular rate and rhythm without murmur gallop or rub normal S1 and S2 Abdomen: Nontender, nondistended, soft, bowel sounds positive, no rebound, no ascites, no appreciable mass.    Data Reviewed: Basic Metabolic Panel:  Lab 03/14/12 4010  03/14/12 0610 03/14/12 0010 03/13/12 1818 03/13/12 1122 03/13/12 0605  NA 129* 132* 136 131* 133* --  K 4.6 4.2 3.8 4.5 3.4* --  CL 101 106 110 109 109 --  CO2 16* 15* 11* 10* 9* --  GLUCOSE 246* 156* 157* 179* 167* --  BUN 64* 62* 65* 58* 57* --  CREATININE 6.89* 6.91* 6.87* 5.72* 6.26* --  CALCIUM 8.1* 8.0* 8.3* 8.3* 8.4 --  MG -- 1.7 -- -- -- 1.7  PHOS -- 4.0 -- -- -- 2.4   Liver Function Tests:  Lab 03/12/12 0445  AST 79*  ALT 35  ALKPHOS 77  BILITOT 0.2*  PROT 6.1  ALBUMIN 2.4*    Lab 03/12/12 0445 03/11/12 1023 03/10/12 2153  LIPASE  204* -- 62*  AMYLASE -- 115* --   No results found for this basename: AMMONIA:5 in the last 168 hours CBC:  Lab 03/14/12 0610 03/13/12 0605 03/12/12 0445 03/11/12 0101 03/10/12 2009  WBC 9.9 11.4* 13.5* 49.1* 20.8*  NEUTROABS -- -- -- -- --  HGB 8.8* 8.9* 8.7* 10.9* 12.2*  HCT 24.1* 24.9* 23.8* 31.0* 35.2*  MCV 79.5 81.9 82.1 88.3 90.3  PLT 188 165 148* 213 214   Cardiac Enzymes:  Lab 03/14/12 0610 03/13/12 1818  CKTOTAL 62 48  CKMB -- --  CKMBINDEX -- --  TROPONINI -- --   BNP (last 3 results) No results found for this basename: PROBNP:3 in the last 8760 hours CBG:  Lab 03/14/12 1218 03/14/12 1133 03/14/12 1022 03/14/12 0925 03/14/12 0821  GLUCAP 183* 224* 275* 209* 150*    Recent Results (from the past 240 hour(s))  MRSA PCR SCREENING     Status: Normal   Collection Time   03/10/12 11:18 PM      Component Value Range Status Comment   MRSA by PCR NEGATIVE  NEGATIVE Final   URINE CULTURE     Status: Normal   Collection Time   03/11/12  1:57 PM      Component Value Range Status Comment   Specimen Description URINE, CLEAN CATCH   Final    Special Requests NONE   Final    Culture  Setup Time 03/11/2012 15:18   Final    Colony Count 25,000 COLONIES/ML   Final    Culture     Final    Value: Multiple bacterial morphotypes present, none predominant. Suggest appropriate recollection if clinically indicated.   Report Status 03/12/2012 FINAL   Final      Studies:  Recent x-ray studies have been reviewed in detail by the Attending Physician  Scheduled Meds:     . bd getting started take home kit  1 kit Other Once  . darbepoetin (ARANESP) injection - NON-DIALYSIS  150 mcg Subcutaneous Q Sun-1800  . enoxaparin  30 mg Subcutaneous Daily  . insulin aspart protamine-insulin aspart  30 Units Subcutaneous Q breakfast  . insulin aspart protamine-insulin aspart  30 Units Subcutaneous Q supper  . DISCONTD: insulin aspart  0-5 Units Subcutaneous QHS  . DISCONTD: insulin  aspart  5 Units Subcutaneous TID WC  . DISCONTD: insulin aspart protamine-insulin aspart  28 Units Subcutaneous Q supper     Jasma Seevers [phone]   On-Call/Text Page:      [email or web address]      password [email or web address]  If 7PM-7AM, please contact night-coverage [email or web address] Password [email or web address] 03/19/2012, 5:05 PM   LOS: 9 days

## 2012-03-20 LAB — GLUCOSE, CAPILLARY
Glucose-Capillary: 236 mg/dL — ABNORMAL HIGH (ref 70–99)
Glucose-Capillary: 151 mg/dL — ABNORMAL HIGH (ref 70–99)
Glucose-Capillary: 97 mg/dL (ref 70–99)

## 2012-03-20 LAB — RENAL FUNCTION PANEL
Albumin: 1.9 g/dL — ABNORMAL LOW (ref 3.5–5.2)
BUN: 68 mg/dL — ABNORMAL HIGH (ref 6–23)
Phosphorus: 5.5 mg/dL — ABNORMAL HIGH (ref 2.3–4.6)
Potassium: 4.1 mEq/L (ref 3.5–5.1)
Sodium: 137 mEq/L (ref 135–145)

## 2012-03-20 LAB — CBC
HCT: 20.7 % — ABNORMAL LOW (ref 39.0–52.0)
MCHC: 34.3 g/dL (ref 30.0–36.0)
RDW: 14.6 % (ref 11.5–15.5)

## 2012-03-20 MED ORDER — INSULIN ASPART 100 UNIT/ML ~~LOC~~ SOLN
0.0000 [IU] | Freq: Three times a day (TID) | SUBCUTANEOUS | Status: DC
  Administered 2012-03-20: 3 [IU] via SUBCUTANEOUS
  Administered 2012-03-22: 7 [IU] via SUBCUTANEOUS
  Administered 2012-03-22: 1 [IU] via SUBCUTANEOUS

## 2012-03-20 MED ORDER — INSULIN ASPART 100 UNIT/ML ~~LOC~~ SOLN
0.0000 [IU] | Freq: Every day | SUBCUTANEOUS | Status: DC
  Administered 2012-03-21: 2 [IU] via SUBCUTANEOUS

## 2012-03-20 NOTE — Progress Notes (Signed)
[facility] Progress Note    [name] [name] WUJ:[id number] DOB: 05-22-1986 DOA: 03/10/2012  Brief narrative: * 27 year old male patient with a recent diagnosis/ history of diabetes presents to [facility] ED with polyuria, polydipsia, headache and fatigue on 10/20.Diagnosed with Diabetic Keto Acidosis. Denies pain, vomiting, vision changes cough,shortness of breath or fever. Pt. states he has had recent weight loss. Pt. takes metformin for his diabetes, and denies missing any medication doses.  Assessment/Plan:  Principal Problem:  *DKA (diabetic ketoacidoses): - Patient started on admission on iv insulin and iv fluids and his DKA corrected  * Transitioned off insulin gtt to 70/30 insulin  -   * continue 70/30 -patient's best option in regard to cost as he is uninsured.  *will add SSI, continue monitoring accuchecks and further treat accordingly.   Acute Renal Failure: * Appreciate Renal assistance. * Etiology is unclear at present. * Pt was not hypotensive, he did not receive radiocontrast or NSAID use, urinalysis does not look "active;" urinalysis shows large blood and only 3-6 RBCs- CK Not elevated.  * He was dialyzed on 10/25 *cr beginning to trend down today 10/30, follow  *Diabetes Mellitus: * Diet education provide by Registered Dietician. * Pt. Understands he will need insulin to treat his Diabetes, and not the oral agents he was taking prior to admission. * Pt will need continued teaching, reinforcement, and community resources to maintain his disease successfully.  Metabolic Encephalopathy:  resolved.   * Anemia: * Anemia panel completed,Fe/TIBC 34 %, ferritin 1639,    per renal does not need IV Fe. * Continue to monitor  Hypertriglyceridemia: * Result obtained 10/21 was > 5000 *Most likely secondary to uncontrolled DM  * Abdominal US was negative for Gall Bladder abnormalities and showed no focal pancreatic abnormalities.    DVT prophylaxis: *Lovenox 30 mg    Code Status: * Full  Disposition Plan:  Unclear - depends on outpt HD needs   Consultants: Renal    Procedures: HD  Antibiotics: * None  HPI/Subjective:  Pt denies n/v, also denies chest pain.   Objective: Blood pressure 127/82, pulse 96, temperature 98.8 F (37.1 C), temperature source Oral, resp. rate 19, height 5\' 5"  (1.651 m), weight 49.6 kg (109 lb 5.6 oz), SpO2 100.00%.  Intake/Output Summary (Last 24 hours) at 03/14/12 1444 Last data filed at 03/14/12 1200  Gross per 24 hour  Intake 1858.75 ml  Output    650 ml  Net 1208.75 ml    Patient Vitals for the past 24 hrs:  BP Temp Temp src Pulse Resp SpO2 Weight  03/20/12 0523 110/69 mmHg 98.3 F (36.8 C) Oral 67  16  99 % -  03/19/12 2229 106/67 mmHg 97.8 F (36.6 C) Oral 80  17  98 % 56.745 kg (125 lb 1.6 oz)  03/19/12 1800 108/68 mmHg 98.2 F (36.8 C) Oral 82  18  98 % -  03/19/12 1327 120/77 mmHg 97.8 F (36.6 C) Oral 78  18  98 % -     Exam: General: No acute respiratory distress Lungs: Clear to auscultation bilaterally without wheezes or crackles Cardiovascular: Regular rate and rhythm without murmur gallop or rub normal S1 and S2 Abdomen: Nontender, nondistended, soft, bowel sounds positive, no rebound, no ascites, no appreciable mass.    Data Reviewed: Basic Metabolic Panel:  Lab 03/14/12 9562 03/14/12 0610 03/14/12 0010 03/13/12 1818 03/13/12 1122 03/13/12 0605  NA 129* 132* 136 131* 133* --  K 4.6 4.2 3.8 4.5 3.4* --  CL 101 106 110 109 109 --  CO2 16* 15* 11* 10* 9* --  GLUCOSE 246* 156* 157* 179* 167* --  BUN 64* 62* 65* 58* 57* --  CREATININE 6.89* 6.91* 6.87* 5.72* 6.26* --  CALCIUM 8.1* 8.0* 8.3* 8.3* 8.4 --  MG -- 1.7 -- -- -- 1.7  PHOS -- 4.0 -- -- -- 2.4   Liver Function Tests:  Lab 03/12/12 0445  AST 79*  ALT 35  ALKPHOS 77  BILITOT 0.2*  PROT 6.1  ALBUMIN 2.4*    Lab 03/12/12 0445 03/11/12 1023 03/10/12 2153  LIPASE 204* -- 62*  AMYLASE -- 115* --   No results  found for this basename: AMMONIA:5 in the last 168 hours CBC:  Lab 03/14/12 0610 03/13/12 0605 03/12/12 0445 03/11/12 0101 03/10/12 2009  WBC 9.9 11.4* 13.5* 49.1* 20.8*  NEUTROABS -- -- -- -- --  HGB 8.8* 8.9* 8.7* 10.9* 12.2*  HCT 24.1* 24.9* 23.8* 31.0* 35.2*  MCV 79.5 81.9 82.1 88.3 90.3  PLT 188 165 148* 213 214   Cardiac Enzymes:  Lab 03/14/12 0610 03/13/12 1818  CKTOTAL 62 48  CKMB -- --  CKMBINDEX -- --  TROPONINI -- --   BNP (last 3 results) No results found for this basename: PROBNP:3 in the last 8760 hours CBG:  Lab 03/14/12 1218 03/14/12 1133 03/14/12 1022 03/14/12 0925 03/14/12 0821  GLUCAP 183* 224* 275* 209* 150*    Recent Results (from the past 240 hour(s))  MRSA PCR SCREENING     Status: Normal   Collection Time   03/10/12 11:18 PM      Component Value Range Status Comment   MRSA by PCR NEGATIVE  NEGATIVE Final   URINE CULTURE     Status: Normal   Collection Time   03/11/12  1:57 PM      Component Value Range Status Comment   Specimen Description URINE, CLEAN CATCH   Final    Special Requests NONE   Final    Culture  Setup Time 03/11/2012 15:18   Final    Colony Count 25,000 COLONIES/ML   Final    Culture     Final    Value: Multiple bacterial morphotypes present, none predominant. Suggest appropriate recollection if clinically indicated.   Report Status 03/12/2012 FINAL   Final      Studies:  Recent x-ray studies have been reviewed in detail by the Attending Physician  Scheduled Meds:     . bd getting started take home kit  1 kit Other Once  . darbepoetin (ARANESP) injection - NON-DIALYSIS  150 mcg Subcutaneous Q Sun-1800  . enoxaparin  30 mg Subcutaneous Daily  . insulin aspart  0-5 Units Subcutaneous QHS  . insulin aspart  0-9 Units Subcutaneous TID WC  . insulin aspart protamine-insulin aspart  30 Units Subcutaneous Q breakfast  . insulin aspart protamine-insulin aspart  30 Units Subcutaneous Q supper     Kela Millin [phone]   On-Call/Text Page:      [email or web address]      password [email or web address]  If 7PM-7AM, please contact night-coverage [email or web address] Password [email or web address] 03/20/2012, 12:31 PM   LOS: 10 days

## 2012-03-20 NOTE — Progress Notes (Signed)
Results for KONSTANTINOS, CORDOBA (MRN [id number]) as of 03/20/2012 09:28  Ref. Range 03/19/2012 07:36 03/19/2012 11:51 03/19/2012 16:12 03/19/2012 21:41 03/19/2012 22:09  Glucose-Capillary Latest Range: 70-99 mg/dL 96 76 409 (H) 64 (L) 811 (H)    Results for LEVERT, HESLOP (MRN [id number]) as of 03/20/2012 09:28  Ref. Range 03/20/2012 08:09  Glucose-Capillary Latest Range: 70-99 mg/dL 213 (H)   Mild hypoglycemia last night at bedtime after patient received 30 units 70/30 insulin with supper.  CBG responded quickly to 15 gram carbohydrate snack.  If this trend continues, may want to decrease supper time dose of 70/30 insulin to 28 units.  Will follow. Ambrose Finland RN, MSN, CDE Diabetes Coordinator Inpatient Diabetes Program [phone]

## 2012-03-20 NOTE — Progress Notes (Signed)
Subjective: Interval History: none.  Objective: Vital signs in last 24 hours: Temp:  [97.8 F (36.6 C)-98.3 F (36.8 C)] 98.3 F (36.8 C) (10/30 0523) Pulse Rate:  [67-82] 67  (10/30 0523) Resp:  [16-18] 16  (10/30 0523) BP: (106-120)/(67-77) 110/69 mmHg (10/30 0523) SpO2:  [98 %-99 %] 99 % (10/30 0523) Weight:  [56.745 kg (125 lb 1.6 oz)] 56.745 kg (125 lb 1.6 oz) (10/29 2229) Weight change: -1.155 kg (-2 lb 8.7 oz)  Intake/Output from previous day: 10/29 0701 - 10/30 0700 In: 1280 [P.O.:1280] Out: 826 [Urine:826] Intake/Output this shift:    General appearance: alert and cooperative Resp: clear to auscultation bilaterally Chest wall: no tenderness, RIJ cath Cardio: S1, S2 normal, S4 present and systolic murmur: holosystolic 2/6, blowing at apex GI: pos bs, liver down 4 cm Extremities: extremities normal, atraumatic, no cyanosis or edema  Lab Results:  Basename 03/20/12 0540 03/19/12 0500  WBC 8.1 7.4  HGB 7.1* 7.5*  HCT 20.7* 22.1*  PLT 247 250   BMET:  Basename 03/20/12 0540 03/19/12 0500  NA 137 140  K 4.1 4.4  CL 104 105  CO2 24 22  GLUCOSE 169* 93  BUN 68* 74*  CREATININE 6.10* 7.45*  CALCIUM 8.5 8.4   No results found for this basename: PTH:2 in the last 72 hours Iron Studies: No results found for this basename: IRON,TIBC,TRANSFERRIN,FERRITIN in the last 72 hours  Studies/Results: No results found.  I have reviewed the patient's current medications.  Assessment/Plan: 1 AKI cr better for first time, nonoliguric and not getting urine recorded.  Keep cath until Cr <5 2 Dm controlled.  No Metformen until Cr <1.5 3 HTN 4 Anemia follow closely P Follow chem, diet,    LOS: 10 days   Clerence Gubser L 03/20/2012,10:33 AM

## 2012-03-20 NOTE — Progress Notes (Signed)
Hypoglycemic Event  CBG: 64  Treatment: 15 GM carbohydrate snack  Symptoms: Shaky  Follow-up CBG: Time: 2209 CBG Result: 124  Possible Reasons for Event: unknown, insulin dose changes  Comments/MD notified: none    Chey Rachels Eileen  Remember to initiate Hypoglycemia Order Set & complete

## 2012-03-20 NOTE — Progress Notes (Signed)
Nutrition Follow-up   Intervention:    Diabetic diet education with patient and parents completed.  Continue current diet and snacks TID.  RD to continue to follow along; will assess need for HD education as able  Assessment:   R IJ trialysis catheter placed 10/25 for ARF 2/2 unknown etiology. First HD on 10/25. Eating 100% of meals.  Pt denies any questions or concerns at this time, he confirms that he is eating very well.  Diet Order:  CHO-modified medium   Meds: Scheduled Meds:    . bd getting started take home kit  1 kit Other Once  . darbepoetin (ARANESP) injection - NON-DIALYSIS  150 mcg Subcutaneous Q Sun-1800  . enoxaparin  30 mg Subcutaneous Daily  . insulin aspart protamine-insulin aspart  30 Units Subcutaneous Q breakfast  . insulin aspart protamine-insulin aspart  30 Units Subcutaneous Q supper   Continuous Infusions:   PRN Meds:.bisacodyl, heparin  Labs:  CMP     Component Value Date/Time   NA 137 03/20/2012 0540   K 4.1 03/20/2012 0540   CL 104 03/20/2012 0540   CO2 24 03/20/2012 0540   GLUCOSE 169* 03/20/2012 0540   BUN 68* 03/20/2012 0540   CREATININE 6.10* 03/20/2012 0540   CALCIUM 8.5 03/20/2012 0540   PROT 6.1 03/12/2012 0445   ALBUMIN 1.9* 03/20/2012 0540   AST 79* 03/12/2012 0445   ALT 35 03/12/2012 0445   ALKPHOS 77 03/12/2012 0445   BILITOT 0.2* 03/12/2012 0445   GFRNONAA 11* 03/20/2012 0540   GFRAA 13* 03/20/2012 0540    CBG (last 3)   Basename 03/20/12 0809 03/19/12 2209 03/19/12 2141  GLUCAP 173* 124* 64*     Intake/Output Summary (Last 24 hours) at 03/20/12 1008 Last data filed at 03/19/12 2230  Gross per 24 hour  Intake   1040 ml  Output    825 ml  Net    215 ml  BM 10/27  Weight Status:  119 lb s/p HD on 10/25  Body mass index is 20.82 kg/(m^2). Weight is WNL  Re-estimated needs (non-HD):  1800-2000 kcals, 70 - 80 gm protein, 2 liters fluid daily.  Nutrition Dx:  Inadequate oral intake, resolved.  New Nutrition Dx:  Altered nutrition-related labs r/t ARF 2/2 unknown etiology AEB GFR <20 and need for temporary HD.  Goal:  Intake to meet >90% of estimated nutrition needs, met.  Monitor:  PO intake, labs, weight trend.  Jarold Motto MS, RD, LDN Pager: [phone] After-hours pager: [phone]

## 2012-03-21 DIAGNOSIS — R609 Edema, unspecified: Secondary | ICD-10-CM | POA: Clinically undetermined

## 2012-03-21 LAB — RENAL FUNCTION PANEL
Albumin: 2.1 g/dL — ABNORMAL LOW (ref 3.5–5.2)
Calcium: 8.6 mg/dL (ref 8.4–10.5)
GFR calc Af Amer: 20 mL/min — ABNORMAL LOW (ref >90)
GFR calc non Af Amer: 17 mL/min — ABNORMAL LOW (ref >90)
Glucose, Bld: 192 mg/dL — ABNORMAL HIGH (ref 70–99)
Phosphorus: 6 mg/dL — ABNORMAL HIGH (ref 2.3–4.6)
Potassium: 4 mEq/L (ref 3.5–5.1)
Sodium: 142 mEq/L (ref 135–145)

## 2012-03-21 LAB — CBC
Hemoglobin: 7.1 g/dL — ABNORMAL LOW (ref 13.0–17.0)
MCH: 28.7 pg (ref 26.0–34.0)
MCHC: 33.5 g/dL (ref 30.0–36.0)
Platelets: 256 10*3/uL (ref 150–400)

## 2012-03-21 LAB — GLUCOSE, CAPILLARY: Glucose-Capillary: 95 mg/dL (ref 70–99)

## 2012-03-21 MED ORDER — INSULIN ASPART PROT & ASPART (70-30 MIX) 100 UNIT/ML ~~LOC~~ SUSP
25.0000 [IU] | Freq: Every day | SUBCUTANEOUS | Status: DC
  Administered 2012-03-21 – 2012-03-22 (×2): 25 [IU] via SUBCUTANEOUS

## 2012-03-21 MED ORDER — GLUCOSE 40 % PO GEL
ORAL | Status: AC
  Administered 2012-03-21: 37.5 g
  Filled 2012-03-21: qty 1

## 2012-03-21 NOTE — Progress Notes (Signed)
Subjective: Interval History: none.  Objective: Vital signs in last 24 hours: Temp:  [98.4 F (36.9 C)-99.4 F (37.4 C)] 98.6 F (37 C) (10/31 0824) Pulse Rate:  [62-87] 62  (10/31 0824) Resp:  [17-20] 18  (10/31 0824) BP: (103-127)/(43-84) 103/43 mmHg (10/31 0824) SpO2:  [97 %-100 %] 100 % (10/31 0824) Weight:  [57.4 kg (126 lb 8.7 oz)] 57.4 kg (126 lb 8.7 oz) (10/30 2106) Weight change: 0.655 kg (1 lb 7.1 oz) Gen NAD Pulm clear, N bs, No R, R, orW CV reg Gr 2/6 M no edema Abdm soft, live down 4 cm,. Pos bs Skin ok RIJ Cath ok Intake/Output from previous day: 10/30 0701 - 10/31 0700 In: 840 [P.O.:840] Out: 850 [Urine:850] Intake/Output this shift:      Lab Results:  Basename 03/21/12 0435 03/20/12 0540  WBC 8.3 8.1  HGB 7.1* 7.1*  HCT 21.2* 20.7*  PLT 256 247   BMET:  Basename 03/21/12 0435 03/20/12 0540  NA 142 137  K 4.0 4.1  CL 107 104  CO2 25 24  GLUCOSE 192* 169*  BUN 57* 68*  CREATININE 4.30* 6.10*  CALCIUM 8.6 8.5   No results found for this basename: PTH:2 in the last 72 hours Iron Studies: No results found for this basename: IRON,TIBC,TRANSFERRIN,FERRITIN in the last 72 hours  Studies/Results: No results found.  I have reviewed the patient's current medications.  Assessment/Plan: 1 AKI resolving slowly, can take cath out and could D/C in am 2 DM good control 3 anemia stable 4  P D/C cath, follow chem    LOS: 11 days   [name] [name] L 03/21/2012,10:07 AM

## 2012-03-21 NOTE — Progress Notes (Signed)
[facility] Progress Note    [name] [name] ZOX:[id number] DOB: [birth year] DOA: 03/10/2012  Brief narrative: * 26 year old male patient with a recent diagnosis/ history of diabetes presents to [facility] ED with polyuria, polydipsia, headache and fatigue on 10/20.Diagnosed with Diabetic Keto Acidosis. Denies pain, vomiting, vision changes cough,shortness of breath or fever. Pt. states he has had recent weight loss. Pt. takes metformin for his diabetes, and denies missing any medication doses.  Assessment/Plan:  Principal Problem:  *DKA (diabetic ketoacidoses): - Patient started on admission on iv insulin and iv fluids and his DKA corrected  * Transitioned off insulin gtt to 70/30 insulin  -   * continue 70/30 -patient's best option in regard to cost as he is uninsured. Will decrease pm dose as hypoglycemic episode last pm  *continue sens SSI, accuchecks and further treat accordingly.   Acute Renal Failure: * Appreciate Renal assistance. * Etiology is unclear at present. * Pt was not hypotensive, he did not receive radiocontrast or NSAID use, urinalysis does not look "active;" urinalysis shows large blood and only 3-6 RBCs- CK Not elevated.  * He was dialyzed on 10/25 *cr began trending down10/30, and continuing to improve, less than 5 today 10/31, per renal if continues to improve catheter to be removed in am.  *Diabetes Mellitus, with hypoglycemic episode: * Diet education provide by Registered Dietician. * Pt. Understands he will need insulin to treat his Diabetes, and not the oral agents he was taking prior to admission. * Pt will need continued teaching, reinforcement, and community resources to maintain his disease successfully. *decrease pm dose of 70/30 to avoid further hypoglycemia Metabolic Encephalopathy:  resolved.   * Anemia: * Anemia panel completed,Fe/TIBC 34 %, ferritin 1639,    per renal does not need IV Fe. * Continue to monitor  Hypertriglyceridemia: * Result  obtained 10/21 was > 5000 *Most likely secondary to uncontrolled DM  * Abdominal US was negative for Gall Bladder abnormalities and showed no focal pancreatic abnormalities.  Peripheral edema *likely 2/2 to renal failure *will obtain dopplers to eval for DVT  DVT prophylaxis: *Lovenox 30 mg   Code Status: * Full  Disposition Plan: possible home in am 11/1   Consultants: Renal    Procedures: HD  Antibiotics: * None  HPI/Subjective:  C/o bil leg swelling, denies sob, also denies chest pain.   Objective: Blood pressure 127/82, pulse 96, temperature 98.8 F (37.1 C), temperature source Oral, resp. rate 19, height 5\' 5"  (1.651 m), weight 49.6 kg (109 lb 5.6 oz), SpO2 100.00%.  Intake/Output Summary (Last 24 hours) at 03/14/12 1444 Last data filed at 03/14/12 1200  Gross per 24 hour  Intake 1858.75 ml  Output    650 ml  Net 1208.75 ml    Patient Vitals for the past 24 hrs:  BP Temp Temp src Pulse Resp SpO2 Weight  03/21/12 0824 103/43 mmHg 98.6 F (37 C) Oral 62  18  100 % -  03/21/12 0516 105/52 mmHg 98.6 F (37 C) Oral 68  17  100 % -  03/20/12 2106 109/76 mmHg 98.4 F (36.9 C) Oral 86  18  100 % 57.4 kg (126 lb 8.7 oz)  03/20/12 1400 127/84 mmHg 99.4 F (37.4 C) Oral 87  20  97 % -     Exam: General: No acute respiratory distress Lungs: Clear to auscultation bilaterally without wheezes or crackles Cardiovascular: Regular rate and rhythm without murmur gallop or rub normal S1 and S2 Abdomen: Nontender, nondistended, soft,  bowel sounds positive, no rebound, no ascites, no appreciable mass.  Extremities: +1-2 edema, no cyanosis   Data Reviewed: Basic Metabolic Panel:  Lab 03/14/12 9604 03/14/12 0610 03/14/12 0010 03/13/12 1818 03/13/12 1122 03/13/12 0605  NA 129* 132* 136 131* 133* --  K 4.6 4.2 3.8 4.5 3.4* --  CL 101 106 110 109 109 --  CO2 16* 15* 11* 10* 9* --  GLUCOSE 246* 156* 157* 179* 167* --  BUN 64* 62* 65* 58* 57* --  CREATININE 6.89*  6.91* 6.87* 5.72* 6.26* --  CALCIUM 8.1* 8.0* 8.3* 8.3* 8.4 --  MG -- 1.7 -- -- -- 1.7  PHOS -- 4.0 -- -- -- 2.4   Liver Function Tests:  Lab 03/12/12 0445  AST 79*  ALT 35  ALKPHOS 77  BILITOT 0.2*  PROT 6.1  ALBUMIN 2.4*    Lab 03/12/12 0445 03/11/12 1023 03/10/12 2153  LIPASE 204* -- 62*  AMYLASE -- 115* --   No results found for this basename: AMMONIA:5 in the last 168 hours CBC:  Lab 03/14/12 0610 03/13/12 0605 03/12/12 0445 03/11/12 0101 03/10/12 2009  WBC 9.9 11.4* 13.5* 49.1* 20.8*  NEUTROABS -- -- -- -- --  HGB 8.8* 8.9* 8.7* 10.9* 12.2*  HCT 24.1* 24.9* 23.8* 31.0* 35.2*  MCV 79.5 81.9 82.1 88.3 90.3  PLT 188 165 148* 213 214   Cardiac Enzymes:  Lab 03/14/12 0610 03/13/12 1818  CKTOTAL 62 48  CKMB -- --  CKMBINDEX -- --  TROPONINI -- --   BNP (last 3 results) No results found for this basename: PROBNP:3 in the last 8760 hours CBG:  Lab 03/14/12 1218 03/14/12 1133 03/14/12 1022 03/14/12 0925 03/14/12 0821  GLUCAP 183* 224* 275* 209* 150*    Recent Results (from the past 240 hour(s))  MRSA PCR SCREENING     Status: Normal   Collection Time   03/10/12 11:18 PM      Component Value Range Status Comment   MRSA by PCR NEGATIVE  NEGATIVE Final   URINE CULTURE     Status: Normal   Collection Time   03/11/12  1:57 PM      Component Value Range Status Comment   Specimen Description URINE, CLEAN CATCH   Final    Special Requests NONE   Final    Culture  Setup Time 03/11/2012 15:18   Final    Colony Count 25,000 COLONIES/ML   Final    Culture     Final    Value: Multiple bacterial morphotypes present, none predominant. Suggest appropriate recollection if clinically indicated.   Report Status 03/12/2012 FINAL   Final      Studies:  Recent x-ray studies all have been reviewed in detail.  Scheduled Meds:     . bd getting started take home kit  1 kit Other Once  . darbepoetin (ARANESP) injection - NON-DIALYSIS  150 mcg Subcutaneous Q Sun-1800  .  dextrose      . enoxaparin  30 mg Subcutaneous Daily  . insulin aspart  0-5 Units Subcutaneous QHS  . insulin aspart  0-9 Units Subcutaneous TID WC  . insulin aspart protamine-insulin aspart  30 Units Subcutaneous Q breakfast  . insulin aspart protamine-insulin aspart  30 Units Subcutaneous Q supper     Kela Millin [phone]   On-Call/Text Page:      [email or web address]      password [email or web address]  If 7PM-7AM, please contact night-coverage [email or web address] Password [email or web address] 03/21/2012, 11:38 AM   LOS: 11 days

## 2012-03-21 NOTE — Progress Notes (Signed)
Instructions for post [facility] catheter removal given via teach back method. [name] [name], [name]

## 2012-03-21 NOTE — Progress Notes (Signed)
Hypoglycemic Event  CBG: 59  Treatment: 1 tube instant glucose  Symptoms: Shaky  Follow-up CBG: Time:01:51 CBG Result:132  Possible Reasons for Event: Medication regimen:   Comments/MD notified:Per hypoglycemia protocol    Shadasia Oldfield Joselita,RN  Remember to initiate Hypoglycemia Order Set & complete

## 2012-03-22 DIAGNOSIS — R609 Edema, unspecified: Secondary | ICD-10-CM

## 2012-03-22 LAB — RENAL FUNCTION PANEL
BUN: 41 mg/dL — ABNORMAL HIGH (ref 6–23)
CO2: 24 mEq/L (ref 19–32)
Calcium: 8.8 mg/dL (ref 8.4–10.5)
GFR calc Af Amer: 33 mL/min — ABNORMAL LOW (ref >90)
Glucose, Bld: 70 mg/dL (ref 70–99)
Potassium: 3.9 mEq/L (ref 3.5–5.1)
Sodium: 142 mEq/L (ref 135–145)

## 2012-03-22 LAB — GLUCOSE, CAPILLARY
Glucose-Capillary: 45 mg/dL — ABNORMAL LOW (ref 70–99)
Glucose-Capillary: 307 mg/dL — ABNORMAL HIGH (ref 70–99)

## 2012-03-22 LAB — CBC
HCT: 22.7 % — ABNORMAL LOW (ref 39.0–52.0)
Hemoglobin: 7.5 g/dL — ABNORMAL LOW (ref 13.0–17.0)
MCH: 29.1 pg (ref 26.0–34.0)
MCHC: 33 g/dL (ref 30.0–36.0)
MCV: 88 fL (ref 78.0–100.0)
RBC: 2.58 MIL/uL — ABNORMAL LOW (ref 4.22–5.81)

## 2012-03-22 MED ORDER — ENOXAPARIN SODIUM 40 MG/0.4ML ~~LOC~~ SOLN: 40.0000 mg | Freq: Every day | SUBCUTANEOUS | Status: DC

## 2012-03-22 MED ORDER — INSULIN ASPART PROT & ASPART (70-30 MIX) 100 UNIT/ML ~~LOC~~ SUSP: SUBCUTANEOUS | Status: DC

## 2012-03-22 MED ORDER — FUROSEMIDE 40 MG PO TABS: 40.0000 mg | ORAL_TABLET | Freq: Two times a day (BID) | ORAL | Status: DC

## 2012-03-22 MED ORDER — GLUCOSE 40 % PO GEL
ORAL | Status: AC
  Administered 2012-03-22: 37.5 g
  Filled 2012-03-22: qty 1

## 2012-03-22 MED ORDER — FUROSEMIDE 40 MG PO TABS
40.0000 mg | ORAL_TABLET | Freq: Two times a day (BID) | ORAL | Status: DC
  Administered 2012-03-22: 40 mg via ORAL
  Filled 2012-03-22 (×2): qty 1

## 2012-03-22 NOTE — Discharge Summary (Signed)
Physician Discharge Summary  [name] [name] ZOX:[id number] DOB: Apr 03, 1986 DOA: 03/10/2012  PCP: Sheila Oats, MD  Admit date: 03/10/2012 Discharge date: 03/22/2012  Time spent: >30 minutes  Recommendations for Outpatient Follow-up:      Follow-up Information    Follow up with Zada Girt, MD. (in 1week, call for appointment upon discharge)    Contact information:   [address] [facility] [place] [address] [phone]       Please follow up. (PCP in 1 week, call for appointment upon discharge)          Discharge Diagnoses:  Principal Problem:  *DKA (diabetic ketoacidoses) Active Problems:  Diabetes mellitus without complication  Metabolic encephalopathy  High blood pressure associated with diabetes  Fatigue  Renal failure, acute  Edema, peripheral   Discharge Condition: Improved/stable  Diet recommendation: Modified carbohydrate  Filed Weights   03/19/12 2229 03/20/12 2106 03/21/12 2245  Weight: 56.745 kg (125 lb 1.6 oz) 57.4 kg (126 lb 8.7 oz) 57.289 kg (126 lb 4.8 oz)    History of present illness:  The patient 26 yo male with diabetes mellitus admitted with chief complaint of fatigue. Pt is very confused and per RN in ED as been confused since arrival to ED. Pt found to be in dka with significant acidosis co2 less than 7. Pt denies any cp, fevers, n/v/d. Denies sob however he is tachypneic mildly and tachycardic during my visit. He denies any h/o being on insulin at home in the past. Denies any other h/o hiv or other chronic medical issues. He is mildly confused during my interview and repeatively answers "type II diabetes" to multiple questions and has to be redirected. He appears to be in no pain. He says he has been losing weight but doesn't know how much. Denies any symptoms except been feeling very tired. He is from [place] and is new to the area and lives with his sister. He was admitted for further evaluation and  management.   Hospital Course:  Assessment/Plan:  Principal Problem:  *DKA (diabetic ketoacidoses):  - Upon admission patient Patient was started on admission on iv insulin and iv fluids and when his DKA corrected  * he wasTransitioned off insulin gtt to 70/30 insulin *He was then maintained on the 70/30 the rest of his hospitalization, with continued monitoring of his blood sugars and the dose was adjusted accordingly for improved blood glucose control. *He received diabetes teaching in the hospital, and is to followup with her PCP for further monitoring of his sugars. Case manager has given patient information to get set up with PCP outpatient-  And per his mother is assisting patient was making the calls to establish a PCP prior to discharge. * Acute Renal Failure:  * Nephrology was consulted and followed patient upon admission.  * Etiology is unclear at present.  * Pt was not hypotensive, he did not receive radiocontrast or NSAID use, urinalysis does not look "active;" urinalysis shows large blood and only 3-6 RBCs- CK Not elevated.  * He was dialyzed on 10/25  *cr began trending down10/30, and continued to improve and the dialysis catheter was removed on 10/31. *On followup today, his creatinine is down to 2.86, discussed with renal -Dr. Darrick Penna and from his standpoint okay to discharge-and that he will need to have him followup be met in 1 week. Assessment discussed with patient and his to follow up with PCP or with Dr. Caryn Section for Bmet. *Diabetes Mellitus, with hypoglycemic episode:  * Diet education provide  by Registered Dietician.  * Pt. Understands he will need insulin to treat his Diabetes, and not the oral agents he was taking prior to admission.  * Pt will need continued teaching, reinforcement, and community resources to maintain his disease successfully as said that per case management. Please see #1 above. Metabolic Encephalopathy:  Secondary to 1 & 2, resolved.  * Anemia:  *  Anemia panel completed,Fe/TIBC 34 %, ferritin 1639,  per renal does not need IV Fe. Patient was treated with epo per renal while in the hospital, and it was DC'd today. *Had no gross evidence of bleeding. Hypertriglyceridemia:  * Result obtained 10/21 was > 5000  *Most likely secondary to uncontrolled DM  * Abdominal US was negative for Gall Bladder abnormalities and showed no focal pancreatic abnormalities.  *Patient was not started on an antilipid agent to his his renal impairment (as these can further worsened renal function). -Recommendation is for patient to have followup recheck fasting lipid panel per his primary care physician and to be started on antilipid agents when appropriate/when his renal function is even much more improved. Peripheral edema  *likely 2/2 to renal failure. He has had no shortness of breath  *Doppler ultrasound negative for DVT *Patient started on Lasix per renal for a total of 4 doses. He is to follow up outpatient.   Procedures:  Dialysis per renal  Consultations:  Renal-Dr. Fox/Deterding  Critical care medicine  Discharge Exam: Filed Vitals:   03/21/12 1745 03/21/12 2126 03/21/12 2245 03/22/12 0543  BP: 116/90 124/64  111/57  Pulse: 65 81  66  Temp: 98.8 F (37.1 C) 97.4 F (36.3 C)  98.1 F (36.7 C)  TempSrc: Oral Oral  Oral  Resp: 19 18  17   Height:      Weight:   57.289 kg (126 lb 4.8 oz)   SpO2: 98% 100%  100%    Exam:  General: No acute respiratory distress  Lungs: Clear to auscultation bilaterally without wheezes or crackles  Cardiovascular: Regular rate and rhythm without murmur gallop or rub normal S1 and S2  Abdomen: Nontender, nondistended, soft, bowel sounds positive, no rebound, no ascites, no appreciable mass.  Extremities: +2 edema, no cyanosis   Discharge Instructions  Discharge Orders    Future Orders Please Complete By Expires   Diet Carb Modified      Increase activity slowly      Discharge instructions       Comments:   Followup with PCP to have Bmet done in 1 week(followup on kidney function)       Medication List     As of 03/22/2012  3:27 PM    STOP taking these medications         glipiZIDE 5 MG tablet   Commonly known as: GLUCOTROL      metFORMIN 1000 MG tablet   Commonly known as: GLUCOPHAGE      TAKE these medications         furosemide 40 MG tablet   Commonly known as: LASIX   Take 1 tablet (40 mg total) by mouth 2 (two) times daily.   Start taking on: 03/23/2012      insulin aspart protamine-insulin aspart (70-30) 100 UNIT/ML injection   Commonly known as: NOVOLOG 70/30   30 units q. a.m. with breakfast, and 25 units Q. P.M. with supper           Follow-up Information    Follow up with Zada Girt, MD. (in 1week, call for  appointment upon discharge)    Contact information:   [address] [facility] [place] [address] [phone]       Please follow up. (PCP in 1 week, call for appointment upon discharge)           The results of significant diagnostics from this hospitalization (including imaging, microbiology, ancillary and laboratory) are listed below for reference.    Significant Diagnostic Studies: Ct Head Wo Contrast  03/10/2012  *RADIOLOGY REPORT*  Clinical Data: Headache, weakness, and fatigue.  CT HEAD WITHOUT CONTRAST  Technique:  Contiguous axial images were obtained from the base of the skull through the vertex without contrast.  Comparison: None.  Findings: There is no acute intracranial hemorrhage, infarction, or mass lesion.  Brain parenchyma appears normal.  There is slight mucosal thickening in the frontal and ethmoid air cells, probably chronic.  IMPRESSION: No acute intracranial abnormality.  Probable chronic changes in the paranasal sinuses.   Original Report Authenticated By: Gwynn Burly, M.D.    US Abdomen Complete  03/12/2012  *RADIOLOGY REPORT*  Clinical Data:  History of acute pancreatitis and renal  failure. History of diabetes and hypertriglyceridemia.  ABDOMINAL ULTRASOUND COMPLETE  Comparison:  None  Findings:  Gallbladder: No shadowing gallstones or echogenic sludge. No evidence of pericholecystic fluid. The gallbladder wall thickness was slightly prominent measuring 2.6 mm. Gallbladder may be partially contracted.  No sonographic Murphy's sign according to the ultrasound technologist.  CBD: Normal in caliber measuring 3.4 mm. No choledocholithiasis is evident.  Liver:  Normal size and echotexture without focal parenchymal abnormality.  IVC:  Patent throughout its visualized course in the abdomen.  Pancreas:  Although the pancreas is difficult to visualize in its entirety, no focal pancreatic abnormality is identified.  Spleen:  Normal size and echotexture without focal abnormality. Length is 7 cm.  Right kidney:  No hydronephrosis.  Well-preserved cortex.  Normal parenchymal echotexture without focal abnormalities.  Right renal length is 11.4 cm.  Left kidney:  No hydronephrosis.  Well-preserved cortex.  Normal parenchymal echotexture without focal abnormalities.  Left renal length is 11.7 cm.  Aorta:  Maximum diameter is 1.7 cm.  No aneurysm is evident. Distal portion and aortic bifurcation were obscured by bowel gas.  Ascites:  None.  IMPRESSION: No abdominal pathology was demonstrated.  The gallbladder appears partially contracted with slight prominence of the thickness of the wall of the gallbladder.  No cholelithiasis was evident.  No pericholecystic fluid was evident.  No sonographic positive Murphy's sign was present according to the technologist.   Original Report Authenticated By: Crawford Givens, M.D.    Ir Fluoro Guide Cv Line Right  03/15/2012  *RADIOLOGY REPORT*  Clinical history: Acute renal failure  RIGHT IJ CATHETER PLACEMENT UNDER ULTRASOUND AND FLUOROSCOPIC GUIDANCE  Technique: The procedure, risks (including but not limited to bleeding, infection, organ damage, pneumothorax), benefits,  and alternatives were explained to the patient.  Questions regarding the procedure were encouraged and answered.  The patient understands and consents to the procedure.  Intravenous Fentanyl and Versed were administered as conscious sedation during continuous cardiorespiratory monitoring by the radiology RN, with a total moderate sedation time of 10 minutes.   Patency of the right IJ vein was confirmed with ultrasound with image documentation. An appropriate skin site was determined. Skin site was marked. Region was prepped using maximum barrier technique including cap and mask, sterile gown, sterile gloves, large sterile sheet, and Chlorhexidine as cutaneous antisepsis.  The region was infiltrated locally with 1%  lidocaine.  Under real-time ultrasound guidance, the right  IJ vein was accessed with a 19 gauge needle; the needle tip within the vein was confirmed with ultrasound image documentation.   The needle exchanged over a guidewire for vascular dilator which allowed advancement of a 20 cm Trialysis catheter. This was positioned with the tip at the cavoatrial junction. Spot chest radiograph shows good positioning and no pneumothorax. Catheter was flushed and sutured externally with 0-Prolene sutures. Patient tolerated the procedure well, with no immediate complication.  IMPRESSION 1. Technically successful right IJ Trialysis catheter placement.   Original Report Authenticated By: Osa Craver, M.D.    Ir US Guide Vasc Access Right  03/15/2012  *RADIOLOGY REPORT*  Clinical history: Acute renal failure  RIGHT IJ CATHETER PLACEMENT UNDER ULTRASOUND AND FLUOROSCOPIC GUIDANCE  Technique: The procedure, risks (including but not limited to bleeding, infection, organ damage, pneumothorax), benefits, and alternatives were explained to the patient.  Questions regarding the procedure were encouraged and answered.  The patient understands and consents to the procedure.  Intravenous Fentanyl and Versed were  administered as conscious sedation during continuous cardiorespiratory monitoring by the radiology RN, with a total moderate sedation time of 10 minutes.   Patency of the right IJ vein was confirmed with ultrasound with image documentation. An appropriate skin site was determined. Skin site was marked. Region was prepped using maximum barrier technique including cap and mask, sterile gown, sterile gloves, large sterile sheet, and Chlorhexidine as cutaneous antisepsis.  The region was infiltrated locally with 1% lidocaine.  Under real-time ultrasound guidance, the right  IJ vein was accessed with a 19 gauge needle; the needle tip within the vein was confirmed with ultrasound image documentation.   The needle exchanged over a guidewire for vascular dilator which allowed advancement of a 20 cm Trialysis catheter. This was positioned with the tip at the cavoatrial junction. Spot chest radiograph shows good positioning and no pneumothorax. Catheter was flushed and sutured externally with 0-Prolene sutures. Patient tolerated the procedure well, with no immediate complication.  IMPRESSION 1. Technically successful right IJ Trialysis catheter placement.   Original Report Authenticated By: Osa Craver, M.D.    Dg Chest Port 1 View  03/12/2012  *RADIOLOGY REPORT*  Clinical Data: Evaluate endotracheal tube position.  Diabetes.  PORTABLE CHEST - 1 VIEW  Comparison: 03/10/2012  Findings: Numerous leads and wires project over the chest.  Midline trachea.  Normal heart size.  No pleural effusion or pneumothorax. Clear lungs.  Mildly low lung volumes.  IMPRESSION: No acute cardiopulmonary disease.  No endotracheal tube identified.   Original Report Authenticated By: Consuello Bossier, M.D.    Dg Chest Port 1 View  03/11/2012  *RADIOLOGY REPORT*  Clinical Data:   Diabetic ketoacidosis.  PORTABLE CHEST - 1 VIEW  Comparison: None  Findings: The lungs are clear.  No edema, infiltrate or pleural fluid is identified.   Heart size and mediastinal contours are within normal limits.  No bony abnormalities are identified.  IMPRESSION: No active disease.   Original Report Authenticated By: Reola Calkins, M.D.    Dg Abd Portable 1v  03/11/2012  *RADIOLOGY REPORT*  Clinical Data: Abdominal distention and weakness.  PORTABLE ABDOMEN - 1 VIEW  Comparison: None.  Findings: The stomach is markedly distended with air.  A fair amount of stool is seen in the colon.  No definite small bowel dilatation.  IMPRESSION:  1.  Stomach is markedly distended with air. 2.  Question constipation.  Original Report Authenticated By: Reyes Ivan, M.D.     Microbiology: No results found for this or any previous visit (from the past 240 hour(s)).   Labs: Basic Metabolic Panel:  Lab 03/22/12 1610 03/21/12 0435 03/20/12 0540 03/19/12 0500 03/18/12 0635  NA 142 142 137 140 135  K 3.9 4.0 4.1 4.4 4.1  CL 107 107 104 105 102  CO2 24 25 24 22 22   GLUCOSE 70 192* 169* 93 139*  BUN 41* 57* 68* 74* 74*  CREATININE 2.86* 4.30* 6.10* 7.45* 7.90*  CALCIUM 8.8 8.6 8.5 8.4 8.1*  MG -- -- -- -- --  PHOS 5.4* 6.0* 5.5* 4.7* 4.5   Liver Function Tests:  Lab 03/22/12 0656 03/21/12 0435 03/20/12 0540 03/19/12 0500 03/18/12 0635  AST -- -- -- -- --  ALT -- -- -- -- --  ALKPHOS -- -- -- -- --  BILITOT -- -- -- -- --  PROT -- -- -- -- --  ALBUMIN 2.4* 2.1* 1.9* 1.9* 1.9*   No results found for this basename: LIPASE:5,AMYLASE:5 in the last 168 hours No results found for this basename: AMMONIA:5 in the last 168 hours CBC:  Lab 03/22/12 0656 03/21/12 0435 03/20/12 0540 03/19/12 0500 03/18/12 0635  WBC 10.7* 8.3 8.1 7.4 6.7  NEUTROABS -- -- -- -- --  HGB 7.5* 7.1* 7.1* 7.5* 7.8*  HCT 22.7* 21.2* 20.7* 22.1* 21.9*  MCV 88.0 85.8 84.1 84.0 81.4  PLT 283 256 247 250 256   Cardiac Enzymes: No results found for this basename: CKTOTAL:5,CKMB:5,CKMBINDEX:5,TROPONINI:5 in the last 168 hours BNP: BNP (last 3 results) No results found  for this basename: PROBNP:3 in the last 8760 hours CBG:  Lab 03/22/12 1145 03/22/12 0837 03/22/12 0750 03/21/12 2031 03/21/12 1652  GLUCAP 130* 105* 45* 245* 95       Signed:  Dajia Gunnels C  [facility] 03/22/2012, 3:27 PM

## 2012-03-22 NOTE — Progress Notes (Signed)
Pt. Got discharge papers and prescriptions,education was emphasize on insulin administration and blood sugar checks.pt. Verbalized his understanding of the education.IV was d/c,telemetry box was d/c.

## 2012-03-22 NOTE — Progress Notes (Signed)
Alda Ponder mother  [phone] Called her regarding MD follow up, she will need to contact one of the clinics on the list provided to schedule an appointment. Per the MD he will need lab work done next week.

## 2012-03-22 NOTE — Progress Notes (Addendum)
Pt's CBG was 45,MD. Has been notified,glucose gel was given,pt. eat ting breakfast now,keep monitoring pt. Closely.Stat blood glucose was orderd.

## 2012-03-22 NOTE — Progress Notes (Signed)
Subjective: Interval History: Doesn't hav eoutpatient Dr. .  Objective: Vital signs in last 24 hours: Temp:  [97.4 F (36.3 C)-98.9 F (37.2 C)] 98.1 F (36.7 C) (11/01 0543) Pulse Rate:  [65-81] 66  (11/01 0543) Resp:  [17-19] 17  (11/01 0543) BP: (111-124)/(57-90) 111/57 mmHg (11/01 0543) SpO2:  [98 %-100 %] 100 % (11/01 0543) Weight:  [57.289 kg (126 lb 4.8 oz)] 57.289 kg (126 lb 4.8 oz) (10/31 2245) Weight change: -0.111 kg (-3.9 oz)  Intake/Output from previous day: 10/31 0701 - 11/01 0700 In: 780 [P.O.:780] Out: 4 [Urine:3; Stool:1] Intake/Output this shift:    General appearance: alert and cooperative Back: negative, symmetric, no curvature. ROM normal. No CVA tenderness. Cardio: S1, S2 normal and systolic murmur: holosystolic 2/6, blowing at apex GI: pos bs, soft, liver down 4 cm Extremities: edema 3+ Lungs without R,R, or W  Lab Results:  [facility] 03/22/12 0656 03/21/12 0435  WBC 10.7* 8.3  HGB 7.5* 7.1*  HCT 22.7* 21.2*  PLT 283 256   BMET:  Basename 03/22/12 0656 03/21/12 0435  NA 142 142  K 3.9 4.0  CL 107 107  CO2 24 25  GLUCOSE 70 192*  BUN 41* 57*  CREATININE 2.86* 4.30*  CALCIUM 8.8 8.6   No results found for this basename: PTH:2 in the last 72 hours Iron Studies: No results found for this basename: IRON,TIBC,TRANSFERRIN,FERRITIN in the last 72 hours  Studies/Results: No results found.  I have reviewed the patient's current medications.  Assessment/Plan: 1 AKI improving, vol xs, add Lasix for 2 days 2 Anemia stable, should get better, will d/c epo 3 DM will need chronic F/U P D/C epo, Lasix follow Cr    LOS: 12 days   [name] [name] L 03/22/2012,10:50 AM

## 2012-03-22 NOTE — Progress Notes (Signed)
VASCULAR LAB PRELIMINARY  PRELIMINARY  PRELIMINARY  PRELIMINARY  Bilateral lower extremity venous duplex  completed.    Preliminary report:  Bilateral:  No evidence of DVT, superficial thrombosis, or Baker's Cyst.    Ozias Dicenzo, RVT 03/22/2012, 11:32 AM

## 2012-03-22 NOTE — Progress Notes (Signed)
   CARE MANAGEMENT NOTE 03/22/2012  Patient:  [name] [name], [name] [name]   Account Number:  [id number]  Date Initiated:  03/11/2012  Documentation initiated by:  Avie Arenas  Subjective/Objective Assessment:   DKA  Lives with sister.     Action/Plan:   Anticipated DC Date:  03/15/2012   Anticipated DC Plan:  HOME W HOME HEALTH SERVICES      DC Planning Services  CM consult      Choice offered to / List presented to:             Status of service:  In process, will continue to follow Medicare Important Message given?   (If response is "NO", the following Medicare IM given date fields will be blank) Date Medicare IM given:   Date Additional Medicare IM given:    Discharge Disposition:    Per UR Regulation:  Reviewed for med. necessity/level of care/duration of stay  If discussed at Long Length of Stay Meetings, dates discussed:    Comments:  ContactAlda Ponder Mother [phone]  03/22/2012  [address] RN, [place]  [phone] Met with patient regarding discharge plans regarding MD follow up and glucometer. He gave the clinic list to his mom and someone called him about getting the free glucometer.  The catheter was removed today.  CM to continue to follow for discharge medications for ZZ fund.  03/20/2012 [address] RN, [place]  [phone] Met with patient regarding discharge planning. Medications he currently gets at [facility]. He does not have a meter to monitor blood sugar. He does not have a PCP. Discussed options for clinic follow up and co payments, he may be able to afford, will have to talk with mother. Explained zz fund for medications.  CM will call mother.  Call to Mother Alda Ponder [phone] reviewed the discharge planning, eligible for meds from pharmacy at discharge, reviewed listing of clinics for post discharge follow up. CM will leave information with patient, for the clinics and phone number to call for free glucose monitor.   03/18/2012  [address] RN,  [place]  [phone] Financial Counselor: Arline Asp  [phone] they spoke with patient  03-13-12 10:30am Avie Arenas, RNBSN [phone] Patient sitting up in chair - awake and alert.  Continues on insulin drip - added bicarb drip.  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  Step father is a diabetic and will be giving patient one ofhis monitors and strips.  Will need PCP - have asked physician to referal to internal medicine or family practice clinic here at [facility]. Recently diagnosed and on po meds - both 4 dollar meds. Will probably need insulin on discharge with med assistance.  Diabetes Teaching service on case and working with patient.    Planning to tx off unit today to intermediate care unit.  03-12-12 1:40pm Avie Arenas, RNBSN [phone] Lost job in [place] along with insurance.  Moved to [place] to live with sister.  Mother states recently diagnosed with DM.  Does not have anything.  Placed on po medications. Will need PCP, med assist and glucose monitor.  Patient with blank stare -  not responding to questions. unable to have coversation.  Diabetes Teaching service notified of need.  Talked with Beryl Meager - states will be following her.

## 2012-03-22 NOTE — Progress Notes (Signed)
Utilization review completed.  

## 2013-07-07 ENCOUNTER — Inpatient Hospital Stay (HOSPITAL_COMMUNITY)
Admission: EM | Admit: 2013-07-07 | Discharge: 2013-07-10 | DRG: 638 | Disposition: A | Discharge status: Home / Self Care | Payer: Self-pay | Attending: Internal Medicine | Admitting: Internal Medicine

## 2013-07-07 ENCOUNTER — Emergency Department (HOSPITAL_COMMUNITY): Payer: Self-pay

## 2013-07-07 ENCOUNTER — Encounter (HOSPITAL_COMMUNITY): Payer: Self-pay | Admitting: Emergency Medicine

## 2013-07-07 DIAGNOSIS — E119 Type 2 diabetes mellitus without complications: Secondary | ICD-10-CM

## 2013-07-07 DIAGNOSIS — E46 Unspecified protein-calorie malnutrition: Secondary | ICD-10-CM | POA: Diagnosis present

## 2013-07-07 DIAGNOSIS — E1110 Type 2 diabetes mellitus with ketoacidosis without coma: Secondary | ICD-10-CM | POA: Diagnosis present

## 2013-07-07 DIAGNOSIS — I498 Other specified cardiac arrhythmias: Secondary | ICD-10-CM | POA: Diagnosis present

## 2013-07-07 DIAGNOSIS — E876 Hypokalemia: Secondary | ICD-10-CM | POA: Diagnosis present

## 2013-07-07 DIAGNOSIS — E1159 Type 2 diabetes mellitus with other circulatory complications: Secondary | ICD-10-CM | POA: Diagnosis present

## 2013-07-07 DIAGNOSIS — Z91199 Patient's noncompliance with other medical treatment and regimen due to unspecified reason: Secondary | ICD-10-CM

## 2013-07-07 DIAGNOSIS — Z681 Body mass index (BMI) 19 or less, adult: Secondary | ICD-10-CM

## 2013-07-07 DIAGNOSIS — R636 Underweight: Secondary | ICD-10-CM | POA: Diagnosis present

## 2013-07-07 DIAGNOSIS — F17200 Nicotine dependence, unspecified, uncomplicated: Secondary | ICD-10-CM | POA: Diagnosis present

## 2013-07-07 DIAGNOSIS — Z9119 Patient's noncompliance with other medical treatment and regimen: Secondary | ICD-10-CM

## 2013-07-07 DIAGNOSIS — E860 Dehydration: Secondary | ICD-10-CM | POA: Diagnosis present

## 2013-07-07 DIAGNOSIS — R030 Elevated blood-pressure reading, without diagnosis of hypertension: Secondary | ICD-10-CM | POA: Diagnosis present

## 2013-07-07 DIAGNOSIS — E1010 Type 1 diabetes mellitus with ketoacidosis without coma: Principal | ICD-10-CM | POA: Diagnosis present

## 2013-07-07 DIAGNOSIS — I10 Essential (primary) hypertension: Secondary | ICD-10-CM

## 2013-07-07 LAB — COMPREHENSIVE METABOLIC PANEL
ALT: 19 U/L (ref 0–53)
AST: 32 U/L (ref 0–37)
Albumin: 4.2 g/dL (ref 3.5–5.2)
Alkaline Phosphatase: 128 U/L — ABNORMAL HIGH (ref 39–117)
BUN: 14 mg/dL (ref 6–23)
CALCIUM: 8.5 mg/dL (ref 8.4–10.5)
CREATININE: 0.83 mg/dL (ref 0.50–1.35)
Chloride: 101 mEq/L (ref 96–112)
GLUCOSE: 406 mg/dL — AB (ref 70–99)
Potassium: 3.7 mEq/L (ref 3.7–5.3)
Sodium: 140 mEq/L (ref 137–147)
TOTAL PROTEIN: 8.2 g/dL (ref 6.0–8.3)
Total Bilirubin: <0.2 mg/dL — ABNORMAL LOW (ref 0.3–1.2)

## 2013-07-07 LAB — GLUCOSE, CAPILLARY
GLUCOSE-CAPILLARY: 290 mg/dL — AB (ref 70–99)
GLUCOSE-CAPILLARY: 173 mg/dL — AB (ref 70–99)
GLUCOSE-CAPILLARY: 141 mg/dL — AB (ref 70–99)
GLUCOSE-CAPILLARY: 100 mg/dL — AB (ref 70–99)
Glucose-Capillary: 519 mg/dL — ABNORMAL HIGH (ref 70–99)

## 2013-07-07 LAB — POCT I-STAT 3, VENOUS BLOOD GAS (G3P V)
ACID-BASE DEFICIT: 25 mmol/L — AB (ref 0.0–2.0)
Bicarbonate: 5.7 mEq/L — ABNORMAL LOW (ref 20.0–24.0)
O2 Saturation: 67 %
PH VEN: 6.983 — AB (ref 7.250–7.300)
TCO2: 6 mmol/L (ref 0–100)
pCO2, Ven: 23.9 mmHg — ABNORMAL LOW (ref 45.0–50.0)
pO2, Ven: 52 mmHg — ABNORMAL HIGH (ref 30.0–45.0)

## 2013-07-07 LAB — URINALYSIS, ROUTINE W REFLEX MICROSCOPIC
Bilirubin Urine: NEGATIVE
Glucose, UA: >1000 mg/dL — AB
Ketones, ur: >80 mg/dL — AB
LEUKOCYTES UA: NEGATIVE
NITRITE: NEGATIVE
PH: 5 (ref 5.0–8.0)
Protein, ur: 100 mg/dL — AB
SPECIFIC GRAVITY, URINE: 1.031 — AB (ref 1.005–1.030)
UROBILINOGEN UA: 0.2 mg/dL (ref 0.0–1.0)

## 2013-07-07 LAB — CBC
HEMATOCRIT: 41.3 % (ref 39.0–52.0)
HEMOGLOBIN: 14.8 g/dL (ref 13.0–17.0)
MCH: 32.6 pg (ref 26.0–34.0)
MCHC: 35.8 g/dL (ref 30.0–36.0)
MCV: 91 fL (ref 78.0–100.0)
Platelets: 255 10*3/uL (ref 150–400)
RBC: 4.54 MIL/uL (ref 4.22–5.81)
RDW: 15.6 % — ABNORMAL HIGH (ref 11.5–15.5)
WBC: 11 10*3/uL — ABNORMAL HIGH (ref 4.0–10.5)

## 2013-07-07 LAB — BASIC METABOLIC PANEL
BUN: 11 mg/dL (ref 6–23)
CALCIUM: 7.6 mg/dL — AB (ref 8.4–10.5)
CO2: 7 meq/L — AB (ref 19–32)
CREATININE: 0.69 mg/dL (ref 0.50–1.35)
Chloride: 110 mEq/L (ref 96–112)
GFR calc Af Amer: >90 mL/min (ref >90)
GFR calc non Af Amer: >90 mL/min (ref >90)
Glucose, Bld: 91 mg/dL (ref 70–99)
Potassium: 3.3 mEq/L — ABNORMAL LOW (ref 3.7–5.3)
Sodium: 140 mEq/L (ref 137–147)

## 2013-07-07 LAB — URINE MICROSCOPIC-ADD ON

## 2013-07-07 LAB — CG4 I-STAT (LACTIC ACID): LACTIC ACID, VENOUS: 3.1 mmol/L — AB (ref 0.5–2.2)

## 2013-07-07 MED ORDER — SODIUM CHLORIDE 0.9 % IV SOLN: INTRAVENOUS | Status: DC

## 2013-07-07 MED ORDER — SODIUM CHLORIDE 0.9 % IV BOLUS (SEPSIS)
1000.0000 mL | Freq: Once | INTRAVENOUS | Status: AC
  Administered 2013-07-07: 500 mL via INTRAVENOUS

## 2013-07-07 MED ORDER — SODIUM CHLORIDE 0.9 % IV SOLN
INTRAVENOUS | Status: DC
  Administered 2013-07-07: 1.2 [IU]/h via INTRAVENOUS
  Filled 2013-07-07: qty 1

## 2013-07-07 MED ORDER — POTASSIUM CHLORIDE 10 MEQ/100ML IV SOLN
10.0000 meq | INTRAVENOUS | Status: AC
  Administered 2013-07-08 (×4): 10 meq via INTRAVENOUS
  Filled 2013-07-07 (×4): qty 100

## 2013-07-07 MED ORDER — SODIUM CHLORIDE 0.9 % IV BOLUS (SEPSIS)
1000.0000 mL | Freq: Once | INTRAVENOUS | Status: AC
  Administered 2013-07-07: 1000 mL via INTRAVENOUS

## 2013-07-07 MED ORDER — DEXTROSE 50 % IV SOLN
25.0000 mL | INTRAVENOUS | Status: DC | PRN
Start: 2013-07-07 — End: 2013-07-10

## 2013-07-07 MED ORDER — DEXTROSE 10 % IV SOLN
INTRAVENOUS | Status: DC
  Administered 2013-07-07: 23:00:00 via INTRAVENOUS
  Administered 2013-07-08: 75 mL/h via INTRAVENOUS

## 2013-07-07 MED ORDER — SODIUM CHLORIDE 0.9 % IV SOLN
INTRAVENOUS | Status: AC
  Administered 2013-07-08: 2.6 [IU]/h via INTRAVENOUS
  Administered 2013-07-08: 1.5 [IU]/h via INTRAVENOUS
  Administered 2013-07-08: 3.1 [IU]/h via INTRAVENOUS
  Administered 2013-07-08: 4 [IU]/h via INTRAVENOUS
  Administered 2013-07-08: 3.1 [IU]/h via INTRAVENOUS
  Administered 2013-07-08: 1.6 [IU]/h via INTRAVENOUS
  Administered 2013-07-08: 0.4 [IU]/h via INTRAVENOUS
  Administered 2013-07-08: 3.8 [IU]/h via INTRAVENOUS
  Administered 2013-07-09: 1.9 [IU]/h via INTRAVENOUS
  Filled 2013-07-07: qty 1

## 2013-07-07 MED ORDER — ENOXAPARIN SODIUM 40 MG/0.4ML ~~LOC~~ SOLN
40.0000 mg | SUBCUTANEOUS | Status: DC
  Administered 2013-07-07 – 2013-07-09 (×3): 40 mg via SUBCUTANEOUS
  Filled 2013-07-07 (×4): qty 0.4

## 2013-07-07 MED ORDER — KCL IN DEXTROSE-NACL 20-5-0.45 MEQ/L-%-% IV SOLN: Freq: Once | INTRAVENOUS | Status: DC

## 2013-07-07 MED ORDER — DEXTROSE-NACL 5-0.45 % IV SOLN: INTRAVENOUS | Status: DC

## 2013-07-07 MED ORDER — KCL IN DEXTROSE-NACL 20-5-0.45 MEQ/L-%-% IV SOLN
INTRAVENOUS | Status: DC
Start: 2013-07-07 — End: 2013-07-07
  Administered 2013-07-07: 19:00:00 via INTRAVENOUS
  Filled 2013-07-07 (×2): qty 1000

## 2013-07-07 MED ORDER — POTASSIUM CHLORIDE 10 MEQ/100ML IV SOLN
10.0000 meq | INTRAVENOUS | Status: AC
  Administered 2013-07-07 – 2013-07-08 (×2): 10 meq via INTRAVENOUS
  Filled 2013-07-07: qty 100

## 2013-07-07 MED ORDER — SODIUM CHLORIDE 0.9 % IV BOLUS (SEPSIS)
500.0000 mL | Freq: Once | INTRAVENOUS | Status: AC
  Administered 2013-07-08: 500 mL via INTRAVENOUS

## 2013-07-07 MED ORDER — POTASSIUM CHLORIDE CRYS ER 20 MEQ PO TBCR
40.0000 meq | EXTENDED_RELEASE_TABLET | Freq: Once | ORAL | Status: AC
  Administered 2013-07-07: 40 meq via ORAL
  Filled 2013-07-07: qty 2

## 2013-07-07 MED ORDER — SODIUM CHLORIDE 4 MEQ/ML IV SOLN: INTRAVENOUS | Status: DC

## 2013-07-07 MED ORDER — DEXTROSE-NACL 5-0.45 % IV SOLN
INTRAVENOUS | Status: DC
  Administered 2013-07-07: 22:00:00 via INTRAVENOUS

## 2013-07-07 NOTE — H&P (Signed)
History and Physical  [name] [name] ZOX:[id number] DOB: 1986/02/08 DOA: 07/07/2013  Referring physician: EDP PCP: Default, Provider, MD  Outpatient Specialists:  1. None  Chief Complaint: Frequency of urination, increased thirst and difficulty breathing  HPI: [name] [name] is a 28 y.o. male with history of IDDM, not compliant fully with diet, CBG checks and M.D. followups, claims compliance to insulins, presents to the ED with complaints of frequency of urination, increased thirst and difficulty breathing. He sees a PCP in [place] and moved to the [place] area a month ago. He usually has polyuria-4-5 times in the day in 5-7 times at night. He states that his CBGs usually on in the highs of 350 mg per DL range and occasional lows of 45 mg per DL. Since today, he started experiencing fast and difficulty breathing without associated chest pain, cough, fever or chills. He also started experiencing increased thirst but denies weakness, dizziness or lightheadedness. He states that he's had at least 3 episodes of DKA in the past. He denies nausea, vomiting, abdominal pain or diarrhea. His appetite has decreased. In the ED, initial labs showed bicarbonate less than 7, glucose 406, WBC 11 and chest x-ray without acute findings. Calculated anion gap is at least 32 if not higher. He has received 3 L of IV normal saline bolus and has been started on an insulin drip. Last CBG was 141 mg per DL. Hospitalist admission requested. He denies any recent long-distance travel.   Review of Systems: All systems reviewed and apart from history of presenting illness, are negative.  Past Medical History  Diagnosis Date  . Diabetes mellitus without complication    History reviewed. No pertinent past surgical history. Social History:  reports that he has been smoking.  He does not have any smokeless tobacco history on file. He reports that he drinks alcohol. He reports that he does not use illicit drugs. Single.  Independent of activities of daily living. Denies smoking. Drinks alcohol occasionally.  No Known Allergies  No family history on file. negative family history.   Prior to Admission medications   Medication Sig Start Date End Date Taking? Authorizing Provider  insulin aspart protamine- aspart (NOVOLOG MIX 70/30) (70-30) 100 UNIT/ML injection Inject 30-35 Units into the skin 2 (two) times daily with a meal. Takes 35 units with breakfast and 30 units with supper   Yes Historical Provider, MD   Physical Exam: Filed Vitals:   07/07/13 1815 07/07/13 1845 07/07/13 1930 07/07/13 1945  BP: 130/93 133/88 121/74 126/90  Pulse: 100 91 81 90  Temp:      Resp: 17 24 23 28   SpO2: 100% 100% 100% 100%     General exam: Moderately built and nourished patient, lying comfortably supine on the gurney in no obvious distress.  Head, eyes and ENT: Nontraumatic and normocephalic. Pupils equally reacting to light and accommodation. Oral mucosa dry.  Neck: Supple. No JVD, carotid bruit or thyromegaly.  Lymphatics: No lymphadenopathy.  Respiratory system: Clear to auscultation. Mildly tachypneic.  Cardiovascular system: S1 and S2 heard, RRR. No JVD, murmurs, gallops, clicks or pedal edema.  Gastrointestinal system: Abdomen is nondistended, soft and nontender. Normal bowel sounds heard. No organomegaly or masses appreciated.  Central nervous system: Alert and oriented. No focal neurological deficits.  Extremities: Symmetric 5 x 5 power. Peripheral pulses symmetrically felt.   Skin: No rashes or acute findings.  Musculoskeletal system: Negative exam.  Psychiatry: Pleasant and cooperative.   Labs on Admission:  Basic Metabolic Panel:  Recent Labs Lab 07/07/13 1635  NA 140  K 3.7  CL 101  CO2 <7*  GLUCOSE 406*  BUN 14  CREATININE 0.83  CALCIUM 8.5   Liver Function Tests:  Recent Labs Lab 07/07/13 1635  AST 32  ALT 19  ALKPHOS 128*  BILITOT <0.2*  PROT 8.2  ALBUMIN 4.2   No  results found for this basename: LIPASE, AMYLASE,  in the last 168 hours No results found for this basename: AMMONIA,  in the last 168 hours CBC:  Recent Labs Lab 07/07/13 1635  WBC 11.0*  HGB 14.8  HCT 41.3  MCV 91.0  PLT 255   Cardiac Enzymes: No results found for this basename: CKTOTAL, CKMB, CKMBINDEX, TROPONINI,  in the last 168 hours  BNP (last 3 results) No results found for this basename: PROBNP,  in the last 8760 hours CBG:  Recent Labs Lab 07/07/13 1553 07/07/13 1735 07/07/13 1846 07/07/13 1954  GLUCAP 519* 290* 173* 141*    Radiological Exams on Admission: Dg Chest Portable 1 View  07/07/2013   CLINICAL DATA:  Hyperglycemia, diabetes, shortness of breath  EXAM: PORTABLE CHEST - 1 VIEW  COMPARISON:  03/12/2012  FINDINGS: The heart size and mediastinal contours are within normal limits. Both lungs are clear. The visualized skeletal structures are unremarkable.  IMPRESSION: No active disease.   Electronically Signed   By: Ruel Favorsrevor  Shick M.D.   On: 07/07/2013 16:47    EKG: Independently reviewed. Sinus tachycardia at 113 beats per minute, baseline artifact otherwise no acute changes.  Assessment/Plan Principal Problem:   DKA (diabetic ketoacidoses) Active Problems:   High blood pressure associated with diabetes   Dehydration   1. DKA in poorly controlled diabetic: Last A1c approximately 6 months ago was apparently in the 11 range. Probably precipitated by poor control in the context of dietary and medication noncompliance. Admit to step down. Hydrate aggressively with IV fluids. Continue insulin drip using DKA protocol. Monitor BMP closely. Once DKA has completely resolved, transition to home dose of insulin which will need titration. Check A1c. Diabetes coordinator consultation. Please consult case management in a.m. regarding [facility]. Counseled extensively regarding compliance with all aspects of care-he verbalized  understanding. 2. Dehydration: IV fluids 3. History of anemia: Hemoglobin in 2013 was in the 7 g per DL range. Currently spuriously elevated possibly from dehydration. Follow CBC post hydration 4. History of high blood pressure associated with diabetes: Mildly uncontrolled. Not on medications at home. Monitor. May consider ACE inhibitor if persistently elevated. 5. Sinus tachycardia: Secondary to dehydration and DKA. Monitor on telemetry.     Code Status: Full  Family Communication: None at bedside  Disposition Plan: Home when medically stable   Time spent: 65 minutes  Burk Hoctor, MD, FACP, FHM. [facility] Pager [phone]  If 7PM-7AM, please contact night-coverage [email or web address] Password [email or web address][email or web address] 07/07/2013, 8:06 PM

## 2013-07-07 NOTE — Progress Notes (Signed)
Critical Value of  Co2 7 communicated to midlevel Maren ReamerKaren Kirby

## 2013-07-07 NOTE — ED Notes (Signed)
Per ems-- pt reports hx hyperglycemia. Pt reports he has been lethargic and weak for past 4 hours. Took 1 unit Novolog at home. Pt states avg cbg 400 but he has not checked in several days.

## 2013-07-07 NOTE — ED Provider Notes (Signed)
CSN: [id number]     Arrival date & time 07/07/13  1552 History   First MD Initiated Contact with Patient 07/07/13 1600     Chief Complaint  Patient presents with  . Hyperglycemia     (Consider location/radiation/quality/duration/timing/severity/associated sxs/prior Treatment) HPI Comments: 28 year old male presents by EMS for shortness of breath for the past 4 hours. Patient states he's felt like this before, usually with associated glucose problems. He states that his glucose seems always be over 350 but is not checking it every day. He seems to check his glucose every few days. He states he has been taking his insulin, including today. Denies any fevers, chills, cough, or chest pain. He denies being recently ill. He states for the past few days and more fatigue and had polydipsia and polyuria. He feels like his lips are dry. He denies any leg swelling or leg pain. No history of blood clots. This is similar to the time he had DKA last time.   Past Medical History  Diagnosis Date  . Diabetes mellitus without complication    History reviewed. No pertinent past surgical history. No family history on file. History  Substance Use Topics  . Smoking status: Current Some Day Smoker  . Smokeless tobacco: Not on file  . Alcohol Use: Yes    Review of Systems  Constitutional: Positive for fatigue. Negative for fever.  Respiratory: Positive for shortness of breath. Negative for cough.   Cardiovascular: Negative for chest pain and leg swelling.  Gastrointestinal: Negative for abdominal pain.  Endocrine: Positive for polydipsia and polyuria. Negative for polyphagia.  Genitourinary: Negative for dysuria.  All other systems reviewed and are negative.      Allergies  Review of patient's allergies indicates no known allergies.  Home Medications   Current Outpatient Rx  Name  Route  Sig  Dispense  Refill  . insulin aspart protamine- aspart (NOVOLOG MIX 70/30) (70-30) 100 UNIT/ML  injection   Subcutaneous   Inject 30-35 Units into the skin 2 (two) times daily with a meal. Takes 35 units with breakfast and 30 units with supper          BP 134/89  Pulse 121  Temp(Src) 97.5 F (36.4 C)  Resp 20  SpO2 100% Physical Exam  Nursing note and vitals reviewed. Constitutional: He is oriented to person, place, and time. He appears well-developed and well-nourished. No distress.  HENT:  Head: Normocephalic and atraumatic.  Right Ear: External ear normal.  Left Ear: External ear normal.  Nose: Nose normal.  Dry mucous membranes  Eyes: Right eye exhibits no discharge. Left eye exhibits no discharge.  Neck: Neck supple.  Cardiovascular: Regular rhythm, normal heart sounds and intact distal pulses.  Tachycardia present.   Pulmonary/Chest: Breath sounds normal. No accessory muscle usage. Tachypnea noted.  Abdominal: Soft. He exhibits no distension. There is no tenderness.  Musculoskeletal: He exhibits no edema.  Neurological: He is alert and oriented to person, place, and time.  Skin: Skin is warm and dry.    ED Course  Procedures (including critical care time) Labs Review Labs Reviewed  CBC - Abnormal; Notable for the following:    WBC 11.0 (*)    RDW 15.6 (*)    All other components within normal limits  COMPREHENSIVE METABOLIC PANEL - Abnormal; Notable for the following:    CO2 <7 (*)    Glucose, Bld 406 (*)    Alkaline Phosphatase 128 (*)    Total Bilirubin <0.2 (*)    All  other components within normal limits  URINALYSIS, ROUTINE W REFLEX MICROSCOPIC - Abnormal; Notable for the following:    APPearance CLOUDY (*)    Specific Gravity, Urine 1.031 (*)    Glucose, UA >1000 (*)    Hgb urine dipstick SMALL (*)    Ketones, ur >80 (*)    Protein, ur 100 (*)    All other components within normal limits  GLUCOSE, CAPILLARY - Abnormal; Notable for the following:    Glucose-Capillary 519 (*)    All other components within normal limits  URINE MICROSCOPIC-ADD  ON - Abnormal; Notable for the following:    Casts HYALINE CASTS (*)    All other components within normal limits  GLUCOSE, CAPILLARY - Abnormal; Notable for the following:    Glucose-Capillary 290 (*)    All other components within normal limits  GLUCOSE, CAPILLARY - Abnormal; Notable for the following:    Glucose-Capillary 173 (*)    All other components within normal limits  GLUCOSE, CAPILLARY - Abnormal; Notable for the following:    Glucose-Capillary 141 (*)    All other components within normal limits  CG4 I-STAT (LACTIC ACID) - Abnormal; Notable for the following:    Lactic Acid, Venous 3.10 (*)    All other components within normal limits  POCT I-STAT 3, BLOOD GAS (G3P V) - Abnormal; Notable for the following:    pH, Ven 6.983 (*)    pCO2, Ven 23.9 (*)    pO2, Ven 52.0 (*)    Bicarbonate 5.7 (*)    Acid-base deficit 25.0 (*)    All other components within normal limits   Imaging Review Dg Chest Portable 1 View  07/07/2013   CLINICAL DATA:  Hyperglycemia, diabetes, shortness of breath  EXAM: PORTABLE CHEST - 1 VIEW  COMPARISON:  03/12/2012  FINDINGS: The heart size and mediastinal contours are within normal limits. Both lungs are clear. The visualized skeletal structures are unremarkable.  IMPRESSION: No active disease.   Electronically Signed   By: Ruel Favorsrevor  Shick M.D.   On: 07/07/2013 16:47    EKG Interpretation    Date/Time:  Monday July 07 2013 16:05:03 EST Ventricular Rate:  113 PR Interval:  143 QRS Duration: 85 QT Interval:  315 QTC Calculation: 432 R Axis:   74 Text Interpretation:  Sinus tachycardia Artifact in lead(s) I III aVR aVL V1 V2 V3 V4 V5 V6 and baseline wander in lead(s) V2 V5 No significant change since last tracing Confirmed by Jaysen Wey  MD, Marteze Vecchio ([id number]) on 07/07/2013 4:16:54 PM           CRITICAL CARE Performed by: Pricilla LovelessGOLDSTON, Henryk Ursin T   Total critical care time: 30 minutes  Critical care time was exclusive of separately billable  procedures and treating other patients.  Critical care was necessary to treat or prevent imminent or life-threatening deterioration.  Critical care was time spent personally by me on the following activities: development of treatment plan with patient and/or surrogate as well as nursing, discussions with consultants, evaluation of patient's response to treatment, examination of patient, obtaining history from patient or surrogate, ordering and performing treatments and interventions, ordering and review of laboratory studies, ordering and review of radiographic studies, pulse oximetry and re-evaluation of patient's condition.  MDM   Final diagnoses:  DKA (diabetic ketoacidoses)    Patient with DKA as above. I discussed this with the intensivist on call, who states that as long as the patient has no shock or hypotension this can be dealt in the step  down unit. He was given oral and IV potassium prior to insulin due to his potassium being under 4 in the setting of DKA. Besides dehydration and mild tachycardia is otherwise well appearing and has a stable airway. Will admit to step down for further DKA management.    Audree Camel, MD 07/07/13 2017

## 2013-07-08 ENCOUNTER — Inpatient Hospital Stay (HOSPITAL_COMMUNITY): Payer: Self-pay

## 2013-07-08 DIAGNOSIS — E119 Type 2 diabetes mellitus without complications: Secondary | ICD-10-CM

## 2013-07-08 LAB — GLUCOSE, CAPILLARY
GLUCOSE-CAPILLARY: 100 mg/dL — AB (ref 70–99)
GLUCOSE-CAPILLARY: 103 mg/dL — AB (ref 70–99)
GLUCOSE-CAPILLARY: 113 mg/dL — AB (ref 70–99)
GLUCOSE-CAPILLARY: 187 mg/dL — AB (ref 70–99)
GLUCOSE-CAPILLARY: 87 mg/dL (ref 70–99)
GLUCOSE-CAPILLARY: 94 mg/dL (ref 70–99)
GLUCOSE-CAPILLARY: 97 mg/dL (ref 70–99)
Glucose-Capillary: 119 mg/dL — ABNORMAL HIGH (ref 70–99)
Glucose-Capillary: 134 mg/dL — ABNORMAL HIGH (ref 70–99)
Glucose-Capillary: 136 mg/dL — ABNORMAL HIGH (ref 70–99)
Glucose-Capillary: 138 mg/dL — ABNORMAL HIGH (ref 70–99)
Glucose-Capillary: 146 mg/dL — ABNORMAL HIGH (ref 70–99)
Glucose-Capillary: 147 mg/dL — ABNORMAL HIGH (ref 70–99)
Glucose-Capillary: 155 mg/dL — ABNORMAL HIGH (ref 70–99)
Glucose-Capillary: 161 mg/dL — ABNORMAL HIGH (ref 70–99)
Glucose-Capillary: 179 mg/dL — ABNORMAL HIGH (ref 70–99)
Glucose-Capillary: 181 mg/dL — ABNORMAL HIGH (ref 70–99)
Glucose-Capillary: 184 mg/dL — ABNORMAL HIGH (ref 70–99)
Glucose-Capillary: 193 mg/dL — ABNORMAL HIGH (ref 70–99)
Glucose-Capillary: 214 mg/dL — ABNORMAL HIGH (ref 70–99)
Glucose-Capillary: 216 mg/dL — ABNORMAL HIGH (ref 70–99)
Glucose-Capillary: 219 mg/dL — ABNORMAL HIGH (ref 70–99)
Glucose-Capillary: 84 mg/dL (ref 70–99)
Glucose-Capillary: 90 mg/dL (ref 70–99)

## 2013-07-08 LAB — BASIC METABOLIC PANEL
BUN: 9 mg/dL (ref 6–23)
BUN: 8 mg/dL (ref 6–23)
BUN: 8 mg/dL (ref 6–23)
BUN: 7 mg/dL (ref 6–23)
BUN: 6 mg/dL (ref 6–23)
BUN: 5 mg/dL — AB (ref 6–23)
BUN: 5 mg/dL — AB (ref 6–23)
CALCIUM: 7.6 mg/dL — AB (ref 8.4–10.5)
CALCIUM: 8.1 mg/dL — AB (ref 8.4–10.5)
CALCIUM: 8 mg/dL — AB (ref 8.4–10.5)
CALCIUM: 8.2 mg/dL — AB (ref 8.4–10.5)
CHLORIDE: 110 meq/L (ref 96–112)
CHLORIDE: 105 meq/L (ref 96–112)
CO2: 11 meq/L — AB (ref 19–32)
CO2: 11 meq/L — AB (ref 19–32)
CO2: 12 mEq/L — ABNORMAL LOW (ref 19–32)
CO2: 14 mEq/L — ABNORMAL LOW (ref 19–32)
CO2: 16 meq/L — AB (ref 19–32)
CO2: 19 meq/L (ref 19–32)
CO2: 19 mEq/L (ref 19–32)
CREATININE: 0.7 mg/dL (ref 0.50–1.35)
CREATININE: 0.7 mg/dL (ref 0.50–1.35)
CREATININE: 0.62 mg/dL (ref 0.50–1.35)
CREATININE: 0.62 mg/dL (ref 0.50–1.35)
CREATININE: 0.6 mg/dL (ref 0.50–1.35)
Calcium: 7.1 mg/dL — ABNORMAL LOW (ref 8.4–10.5)
Calcium: 7.5 mg/dL — ABNORMAL LOW (ref 8.4–10.5)
Calcium: 7.7 mg/dL — ABNORMAL LOW (ref 8.4–10.5)
Chloride: 108 mEq/L (ref 96–112)
Chloride: 107 mEq/L (ref 96–112)
Chloride: 105 mEq/L (ref 96–112)
Chloride: 109 mEq/L (ref 96–112)
Chloride: 108 mEq/L (ref 96–112)
Creatinine, Ser: 0.72 mg/dL (ref 0.50–1.35)
Creatinine, Ser: 0.64 mg/dL (ref 0.50–1.35)
GFR calc Af Amer: >90 mL/min (ref >90)
GFR calc Af Amer: >90 mL/min (ref >90)
GFR calc Af Amer: >90 mL/min (ref >90)
GFR calc Af Amer: >90 mL/min (ref >90)
GFR calc Af Amer: >90 mL/min (ref >90)
GFR calc Af Amer: >90 mL/min (ref >90)
GFR calc non Af Amer: >90 mL/min (ref >90)
GFR calc non Af Amer: >90 mL/min (ref >90)
GFR calc non Af Amer: >90 mL/min (ref >90)
GFR calc non Af Amer: >90 mL/min (ref >90)
GLUCOSE: 192 mg/dL — AB (ref 70–99)
GLUCOSE: 212 mg/dL — AB (ref 70–99)
GLUCOSE: 188 mg/dL — AB (ref 70–99)
GLUCOSE: 115 mg/dL — AB (ref 70–99)
GLUCOSE: 82 mg/dL (ref 70–99)
Glucose, Bld: 99 mg/dL (ref 70–99)
Glucose, Bld: 147 mg/dL — ABNORMAL HIGH (ref 70–99)
POTASSIUM: 3.2 meq/L — AB (ref 3.7–5.3)
POTASSIUM: 3.1 meq/L — AB (ref 3.7–5.3)
Potassium: 3.2 mEq/L — ABNORMAL LOW (ref 3.7–5.3)
Potassium: 3.1 mEq/L — ABNORMAL LOW (ref 3.7–5.3)
Potassium: 3.2 mEq/L — ABNORMAL LOW (ref 3.7–5.3)
Potassium: 3 mEq/L — ABNORMAL LOW (ref 3.7–5.3)
Potassium: 2.8 mEq/L — CL (ref 3.7–5.3)
SODIUM: 137 meq/L (ref 137–147)
Sodium: 137 mEq/L (ref 137–147)
Sodium: 137 mEq/L (ref 137–147)
Sodium: 136 mEq/L — ABNORMAL LOW (ref 137–147)
Sodium: 137 mEq/L (ref 137–147)
Sodium: 140 mEq/L (ref 137–147)
Sodium: 141 mEq/L (ref 137–147)

## 2013-07-08 LAB — HEMOGLOBIN A1C
Hgb A1c MFr Bld: 14.1 % — ABNORMAL HIGH (ref <5.7)
MEAN PLASMA GLUCOSE: 358 mg/dL — AB (ref <117)

## 2013-07-08 LAB — CBC
HEMATOCRIT: 29.5 % — AB (ref 39.0–52.0)
Hemoglobin: 10.6 g/dL — ABNORMAL LOW (ref 13.0–17.0)
MCH: 31.5 pg (ref 26.0–34.0)
MCHC: 35.9 g/dL (ref 30.0–36.0)
MCV: 87.5 fL (ref 78.0–100.0)
Platelets: 167 10*3/uL (ref 150–400)
RBC: 3.37 MIL/uL — ABNORMAL LOW (ref 4.22–5.81)
RDW: 15.2 % (ref 11.5–15.5)
WBC: 7 10*3/uL (ref 4.0–10.5)

## 2013-07-08 LAB — MRSA PCR SCREENING: MRSA by PCR: NEGATIVE

## 2013-07-08 MED ORDER — MORPHINE SULFATE 2 MG/ML IJ SOLN: 1.0000 mg | INTRAMUSCULAR | Status: DC | PRN

## 2013-07-08 MED ORDER — POTASSIUM CHLORIDE 10 MEQ/100ML IV SOLN
10.0000 meq | INTRAVENOUS | Status: AC
  Administered 2013-07-08 (×4): 10 meq via INTRAVENOUS
  Filled 2013-07-08 (×4): qty 100

## 2013-07-08 MED ORDER — HYDROCODONE-ACETAMINOPHEN 5-325 MG PO TABS
1.0000 | ORAL_TABLET | ORAL | Status: DC | PRN
  Administered 2013-07-08: 1 via ORAL
  Filled 2013-07-08: qty 1

## 2013-07-08 MED ORDER — SODIUM CHLORIDE 0.9 % IV SOLN
INTRAVENOUS | Status: DC
  Administered 2013-07-09: 75 mL/h via INTRAVENOUS

## 2013-07-08 MED ORDER — POTASSIUM CHLORIDE 10 MEQ/100ML IV SOLN
10.0000 meq | INTRAVENOUS | Status: AC
  Administered 2013-07-09 (×4): 10 meq via INTRAVENOUS
  Filled 2013-07-08: qty 100

## 2013-07-08 MED ORDER — DEXTROSE-NACL 5-0.45 % IV SOLN: INTRAVENOUS | Status: DC

## 2013-07-08 NOTE — Progress Notes (Addendum)
Inpatient Diabetes Program Recommendations  AACE/ADA: New Consensus Statement on Inpatient Glycemic Control (2013)  Target Ranges:  Prepandial:   less than 140 mg/dL      Peak postprandial:   less than 180 mg/dL (1-2 hours)      Critically ill patients:  140 - 180 mg/dL   Reason for Visit: Admitted with DKA.  Diabetes history: Type 1, A1C=14.1% Outpatient Diabetes medications: Novolin 70/30 35 units q AM, 30 units with supper Current orders for Inpatient glycemic control: IV insulin with D10 at 75 cc/hr  Spoke at length with patient regarding diabetes management.  He states that his CBG's have never been controlled since diagnosis.  [place]  States he was in the hospital 4 times in the past year for DKA.  He has no insurance and no PCP.  States "I think I need more education on how to manage this".  States he currently does not work and feels unable with his blood sugars being so high.  He previously worked for 5 years at [employer] and 2 years for [employer] prior to diagnosis.  His home insulin regimen is not ideal for Type 1 diabetes however due to cost this is all patient can afford.  He states he applied for Medicaid in October but was told he is ineligible due to not having children and not being disabled.   [facility].  There are free classes being offered on Thursday nights at the center.  Will give him information regarding classes.  Also needs to see if he qualifies for orange card or patient assistance for medications?  This [facility]? Not quiet ready for transition off insulin drip.  May consider Lantus/Novolog regimen at transition.  Consider Lantus 22 units daily and Novolog 6 units tid with meals (hold if patient eats less than 50%) with Novolog sensitive correction.  Patient seems frustrated that he cannot do anything when CBG's are high.  May do better with Lantus/Novolog regimen at  discharge if he is willing to do 4 shots a day and is able to get the insulin from the clinic/orange card.    Will follow. Thanks, Beryl MeagerJenny Clint Biello, RN, BC-ADM Inpatient Diabetes Coordinator Pager [phone]

## 2013-07-08 NOTE — Progress Notes (Signed)
[facility] Progress Note [place] TEAM 1 - Stepdown/ICU TEAM   [name] Mastnthony Riemenschneider ZOX:[id number] DOB: 1985/12/31 DOA: 07/07/2013 PCP: Default, Provider, MD  Brief narrative: [name] [name] is a 28 y.o. male presenting on 07/07/2013 with  with history of IDDM, not compliant fully with diet, CBG checks and M.D. followups, claims compliance to insulins, presents to the ED with complaints of frequency of urination, increased thirst and difficulty breathing. [place] in [place] and moved to the [place] area a month ago. He usually has polyuria-4-5 times in the day in 5-7 times at night. He states that his CBGs usually on in the highs of 350 mg per DL range and occasional lows of 45 mg. He started experiencing fast and difficulty breathing without associated chest pain, cough, fever or chills. He also started experiencing increased thirst but denies weakness, dizziness or lightheadedness. He states that he's had at least 3 episodes of DKA in the past. He denies nausea, vomiting, abdominal pain or diarrhea. His appetite has decreased.  Subjective: No complaints.   Assessment/Plan: Principal Problem:   DKA (diabetic ketoacidoses) - cont insulin infusion - pt moved to this state recently and has no insurance- will need 70/30 Reli[facility]on brand [facility] prior to d/c and f/u with [facility]  Active Problems:   High blood pressure associated with diabetes - BP quite low     Dehydration - cont hydration  Hypokalemia - cont to replace    Code Status: full code Family Communication: none Disposition Plan: follow in SDU- home  Consultants: none  Procedures: none  Antibiotics: none  DVT prophylaxis: Lovenox  Objective: Filed Weights   07/07/13 2215  Weight: 50.1 kg (110 lb 7.2 oz)   Blood pressure 83/72, pulse 85, temperature 98.6 F (37 C), temperature source Oral, resp. rate 13, height 5\' 6"  (1.676 m), weight 50.1 kg (110 lb 7.2 oz), SpO2 99.00%.  Intake/Output  Summary (Last 24 hours) at 07/08/13 1509 Last data filed at 07/08/13 1500  Gross per 24 hour  Intake 5289.73 ml  Output   5350 ml  Net -60.27 ml     Exam: General: No acute respiratory distress Lungs: Clear to auscultation bilaterally without wheezes or crackles Cardiovascular: Regular rate and rhythm without murmur gallop or rub normal S1 and S2 Abdomen: Nontender, nondistended, soft, bowel sounds positive, no rebound, no ascites, no appreciable mass Extremities: No significant cyanosis, clubbing, or edema bilateral lower extremities  Data Reviewed: Basic Metabolic Panel:  Recent Labs Lab 07/08/13 0243 07/08/13 0525 07/08/13 0730 07/08/13 0948 07/08/13 1412  NA 137 137 137 136* 137  K 3.2* 3.2* 3.1* 3.1* 3.2*  CL 110 108 107 105 105  CO2 11* 11* 12* 14* 16*  GLUCOSE 99 147* 192* 212* 188*  BUN 9 8 8 7 6   CREATININE 0.72 0.70 0.70 0.64 0.62  CALCIUM 7.1* 7.5* 7.6* 7.7* 8.1*   Liver Function Tests:  Recent Labs Lab 07/07/13 1635  AST 32  ALT 19  ALKPHOS 128*  BILITOT <0.2*  PROT 8.2  ALBUMIN 4.2   No results found for this basename: LIPASE, AMYLASE,  in the last 168 hours No results found for this basename: AMMONIA,  in the last 168 hours CBC:  Recent Labs Lab 07/07/13 1635 07/08/13 0243  WBC 11.0* 7.0  HGB 14.8 10.6*  HCT 41.3 29.5*  MCV 91.0 87.5  PLT 255 167   Cardiac Enzymes: No results found for this basename: CKTOTAL, CKMB, CKMBINDEX, TROPONINI,  in the last 168 hours  BNP (last 3 results) No results found for this basename: PROBNP,  in the last 8760 hours CBG:  Recent Labs Lab 07/08/13 0405 07/08/13 0500 07/08/13 0600 07/08/13 0826 07/08/13 1117  GLUCAP 119* 134* 155* 214* 136*    Recent Results (from the past 240 hour(s))  MRSA PCR SCREENING     Status: None   Collection Time    07/07/13 10:57 PM      Result Value Ref Range Status   MRSA by PCR NEGATIVE  NEGATIVE Final   Comment:            The GeneXpert MRSA Assay (FDA      approved for NASAL specimens     only), is one component of a     comprehensive MRSA colonization     surveillance program. It is not     intended to diagnose MRSA     infection nor to guide or     monitor treatment for     MRSA infections.     Studies:  Recent x-ray studies have been reviewed in detail by the Attending Physician  Scheduled Meds:  Scheduled Meds: . enoxaparin (LOVENOX) injection  40 mg Subcutaneous Q24H   Continuous Infusions: . dextrose 75 mL/hr at 07/07/13 2313  . insulin (NOVOLIN-R) infusion Stopped (07/08/13 1313)    Time spent on care of this patient: 35 min   Welby Montminy, MD  [facility] Office  [phone] Pager - Text Page per Amion as per below:  On-Call/Text Page:      [email or web address]      password [email or web address]  If 7PM-7AM, please contact night-coverage [email or web address] Password [email or web address] 07/08/2013, 3:09 PM   LOS: 1 day

## 2013-07-08 NOTE — Progress Notes (Signed)
Dr. Butler Denmarkizwan updated K level 3.2, Gap 18, CBG 219 insulin drip restarted after two hours off per glucose stabilizer instructor. Will continue to monitor.

## 2013-07-08 NOTE — Care Management Note (Signed)
    Page 1 of 2   07/10/2013     12:05:43 PM   CARE MANAGEMENT NOTE 07/10/2013  Patient:  [name] [name],[name] [name]   Account Number:  [id number]  Date Initiated:  07/08/2013  Documentation initiated by:  Avie ArenasBROWN,SARAH  Subjective/Objective Assessment:   dka     Action/Plan:   Anticipated DC Date:  07/11/2013   Anticipated DC Plan:  HOME/SELF CARE      DC Planning Services  CM consult  Medication Assistance      Choice offered to / List presented to:             Status of service:  In process, will continue to follow Medicare Important Message given?   (If response is "NO", the following Medicare IM given date fields will be blank) Date Medicare IM given:   Date Additional Medicare IM given:    Discharge Disposition:    Per UR Regulation:  Reviewed for med. necessity/level of care/duration of stay  If discussed at Long Length of Stay Meetings, dates discussed:    Comments:  ContactAlda Ponder:  [place],Gay Mother [phone]  07/10/13- 1200- Donn PieriniKristi Manaal Mandala RN, BSN [phone] Spoke with pt at bedside- regarding f/u with [facility]- info given on clinic and walk-in for f/u- address/phone number along with clinic hours given to pt - pt understands that he just needs to go at discharge as a walk in to get established at the clinic and get help setting up with medication assistance, future appointments- paperwork also given to pt for orange card application- per email from pharmacist at the clinic-they currently- do have samples of Novolog at this time and have a coupon for a 30 day trial of Lantus pens. So MD can write for those at discharge if needed and pt can go to [facility] to get med assist---Otherwise could stay with the NPH 70/30 until seen by the Well[facility] clinic for patient assistance.  07-08-13 2:25pm Avie ArenasSarah Brown, RNBSN [phone]- [phone] patient recently moved here to this area.  Confirmed has no insurance and no PCP.  Works for a [employer] agency, but not getting any hours, so no  income.  Has about 7 refills left for 70/30 insulin from prior physician.  Pays about 25 dollars per bottle of insulin - lasts about 30 days but just ran out and no money to refill.  [place] - agreeable to being set up with [facility]. [facility].  He just needs to go on discharge - explain he needs his insulin and they will set him up with insulin, future appts and working with him getting his orange card.  Went to inform patient - he is asleep.  Will write on discharge summary.

## 2013-07-08 NOTE — Progress Notes (Signed)
UR Completed.  [name] [name] [phone] 07/08/2013  

## 2013-07-08 NOTE — Progress Notes (Signed)
Report called to receiving nurse. Past medical history and present hospitalization course reported. All questions answered. All belongings transported with patient via wheelchair to 3S10.

## 2013-07-09 LAB — BASIC METABOLIC PANEL
BUN: 5 mg/dL — AB (ref 6–23)
BUN: 5 mg/dL — AB (ref 6–23)
BUN: 5 mg/dL — ABNORMAL LOW (ref 6–23)
BUN: 5 mg/dL — AB (ref 6–23)
CALCIUM: 8.4 mg/dL (ref 8.4–10.5)
CHLORIDE: 100 meq/L (ref 96–112)
CO2: 20 mEq/L (ref 19–32)
CO2: 20 mEq/L (ref 19–32)
CO2: 19 mEq/L (ref 19–32)
CO2: 19 meq/L (ref 19–32)
CREATININE: 0.61 mg/dL (ref 0.50–1.35)
CREATININE: 0.58 mg/dL (ref 0.50–1.35)
Calcium: 7.9 mg/dL — ABNORMAL LOW (ref 8.4–10.5)
Calcium: 8 mg/dL — ABNORMAL LOW (ref 8.4–10.5)
Calcium: 8.1 mg/dL — ABNORMAL LOW (ref 8.4–10.5)
Chloride: 108 mEq/L (ref 96–112)
Chloride: 108 mEq/L (ref 96–112)
Chloride: 100 mEq/L (ref 96–112)
Creatinine, Ser: 0.58 mg/dL (ref 0.50–1.35)
Creatinine, Ser: 0.57 mg/dL (ref 0.50–1.35)
GFR calc Af Amer: >90 mL/min (ref >90)
GFR calc Af Amer: >90 mL/min (ref >90)
GFR calc non Af Amer: >90 mL/min (ref >90)
GLUCOSE: 130 mg/dL — AB (ref 70–99)
GLUCOSE: 354 mg/dL — AB (ref 70–99)
Glucose, Bld: 99 mg/dL (ref 70–99)
Glucose, Bld: 283 mg/dL — ABNORMAL HIGH (ref 70–99)
POTASSIUM: 3 meq/L — AB (ref 3.7–5.3)
POTASSIUM: 3.4 meq/L — AB (ref 3.7–5.3)
Potassium: 3 mEq/L — ABNORMAL LOW (ref 3.7–5.3)
Potassium: 3.7 mEq/L (ref 3.7–5.3)
SODIUM: 131 meq/L — AB (ref 137–147)
Sodium: 139 mEq/L (ref 137–147)
Sodium: 138 mEq/L (ref 137–147)
Sodium: 134 mEq/L — ABNORMAL LOW (ref 137–147)

## 2013-07-09 LAB — GLUCOSE, CAPILLARY
GLUCOSE-CAPILLARY: 76 mg/dL (ref 70–99)
GLUCOSE-CAPILLARY: 197 mg/dL — AB (ref 70–99)
GLUCOSE-CAPILLARY: 87 mg/dL (ref 70–99)
Glucose-Capillary: 120 mg/dL — ABNORMAL HIGH (ref 70–99)
Glucose-Capillary: 122 mg/dL — ABNORMAL HIGH (ref 70–99)
Glucose-Capillary: 124 mg/dL — ABNORMAL HIGH (ref 70–99)
Glucose-Capillary: 148 mg/dL — ABNORMAL HIGH (ref 70–99)
Glucose-Capillary: 153 mg/dL — ABNORMAL HIGH (ref 70–99)
Glucose-Capillary: 188 mg/dL — ABNORMAL HIGH (ref 70–99)
Glucose-Capillary: 233 mg/dL — ABNORMAL HIGH (ref 70–99)
Glucose-Capillary: 346 mg/dL — ABNORMAL HIGH (ref 70–99)
Glucose-Capillary: 72 mg/dL (ref 70–99)
Glucose-Capillary: 85 mg/dL (ref 70–99)

## 2013-07-09 MED ORDER — INSULIN ASPART PROT & ASPART (70-30 MIX) 100 UNIT/ML ~~LOC~~ SUSP: 30.0000 [IU] | Freq: Two times a day (BID) | SUBCUTANEOUS | Status: DC

## 2013-07-09 MED ORDER — POTASSIUM CHLORIDE CRYS ER 20 MEQ PO TBCR
40.0000 meq | EXTENDED_RELEASE_TABLET | Freq: Once | ORAL | Status: AC
  Administered 2013-07-09: 40 meq via ORAL
  Filled 2013-07-09: qty 2

## 2013-07-09 MED ORDER — INSULIN ASPART PROT & ASPART (70-30 MIX) 100 UNIT/ML ~~LOC~~ SUSP: 30.0000 [IU] | Freq: Every day | SUBCUTANEOUS | Status: DC

## 2013-07-09 MED ORDER — INSULIN ASPART PROT & ASPART (70-30 MIX) 100 UNIT/ML ~~LOC~~ SUSP
40.0000 [IU] | Freq: Every day | SUBCUTANEOUS | Status: DC
  Administered 2013-07-10: 40 [IU] via SUBCUTANEOUS
  Filled 2013-07-09: qty 10

## 2013-07-09 MED ORDER — WHITE PETROLATUM GEL
Status: AC
Start: 2013-07-09 — End: 2013-07-09
  Administered 2013-07-09: 11:00:00
  Filled 2013-07-09: qty 5

## 2013-07-09 MED ORDER — INSULIN ASPART 100 UNIT/ML ~~LOC~~ SOLN
0.0000 [IU] | Freq: Three times a day (TID) | SUBCUTANEOUS | Status: DC
  Administered 2013-07-09: 11 [IU] via SUBCUTANEOUS

## 2013-07-09 MED ORDER — INSULIN ASPART PROT & ASPART (70-30 MIX) 100 UNIT/ML ~~LOC~~ SUSP
35.0000 [IU] | Freq: Every day | SUBCUTANEOUS | Status: DC
  Administered 2013-07-09: 35 [IU] via SUBCUTANEOUS

## 2013-07-09 MED ORDER — INSULIN ASPART PROT & ASPART (70-30 MIX) 100 UNIT/ML ~~LOC~~ SUSP
35.0000 [IU] | Freq: Every day | SUBCUTANEOUS | Status: DC
  Administered 2013-07-09: 35 [IU] via SUBCUTANEOUS
  Filled 2013-07-09: qty 10

## 2013-07-09 NOTE — Progress Notes (Signed)
Progress Note [facility] TEAM 1 - Stepdown/ICU TEAM   [name] [name] ZOX:[id number] DOB: 29-Feb-1986 DOA: 07/07/2013 PCP: Default, Provider, MD  Brief narrative:  28 y.o. male admitted on 07/07/2013 with history of IDDM, not compliant fully with diet, CBG checks or M.D. followups, claims compliance to insulins, who presented to the ED with complaints of frequency of urination, increased thirst and difficulty breathing. He sees a PCP in [place] and moved to the [place] area a month ago. He states that his CBGs are usually in the 350 range and occasional lows of 45.   Subjective: No complaints. Alert and conversant.  Bored.    Assessment/Plan:  DKA - uncontrolled DM1 - DKA now resolved, but CBG erratic and most recent BMET reveals anion gap is widening again  - A1c 14.1 - pt moved to this state recently and has no insurance - will need 70/30 Reli-on brand [facility] prior to d/c and f/u with [facility], who can hopefully assist pt w/ lantus and meal coverage SSI insulin plan to better allow for daily variation in dosing  -will not yet d/c due to above - recheck BMET this evening - adjust insulin dosing -educated pt and mother on absolute need for strict ongoing control to avoid early onset of severe DM complications, or even death from DKA or hypoglycemia   Hx of High blood pressure associated with diabetes - BP stable at this time - follow w/o change today    Dehydration - resolved - cont low dose IVF until CBG better controlled to avoid relapse  Persistent Hypokalemia - cont to replace - check Mg   Malnutrition / Underweight - Body mass index is 17.84 kg/(m^2). - likely due to catabolic state related to lack of insulin in this type 1 diabetic - explained need for scheduled regular meals and strict insulin dosing   Code Status: FULL Family Communication: spoke w/ pt and his mother at bedside at length  Disposition Plan:    Consultants: none  Procedures: none  Antibiotics: none  DVT prophylaxis: Lovenox  Objective: Filed Weights   07/07/13 2215  Weight: 50.1 kg (110 lb 7.2 oz)   Blood pressure 136/92, pulse 65, temperature 97.8 F (36.6 C), temperature source Oral, resp. rate 12, height 5\' 6"  (1.676 m), weight 50.1 kg (110 lb 7.2 oz), SpO2 100.00%.  Intake/Output Summary (Last 24 hours) at 07/09/13 1130 Last data filed at 07/09/13 0802  Gross per 24 hour  Intake 1375.79 ml  Output   2675 ml  Net -1299.21 ml   Exam: General: No acute respiratory distress Lungs: Clear to auscultation bilaterally without wheezes or crackles Cardiovascular: Regular rate and rhythm without murmur gallop or rub normal S1 and S2 Abdomen: Nontender, nondistended, soft, bowel sounds positive, no rebound, no ascites, no appreciable mass Extremities: No significant cyanosis, clubbing, or edema B lower extremities  Data Reviewed: Basic Metabolic Panel:  Recent Labs Lab 07/08/13 2033 07/08/13 2250 07/09/13 0446 07/09/13 0530 07/09/13 1000  NA 140 141 139 138 134*  K 3.0* 2.8* 3.0* 3.0* 3.4*  CL 109 108 108 108 100  CO2 19 19 20 20 19   GLUCOSE 115* 82 130* 99 283*  BUN 5* 5* 5* 5* 5*  CREATININE 0.62 0.60 0.61 0.58 0.58  CALCIUM 8.0* 8.2* 7.9* 8.0* 8.1*   Liver Function Tests:  Recent Labs Lab 07/07/13 1635  AST 32  ALT 19  ALKPHOS 128*  BILITOT <0.2*  PROT 8.2  ALBUMIN 4.2   CBC:  Recent Labs Lab  07/07/13 1635 07/08/13 0243  WBC 11.0* 7.0  HGB 14.8 10.6*  HCT 41.3 29.5*  MCV 91.0 87.5  PLT 255 167   CBG:  Recent Labs Lab 07/09/13 0352 07/09/13 0446 07/09/13 0554 07/09/13 0637 07/09/13 0803  GLUCAP 188* 120* 85 87 124*    Recent Results (from the past 240 hour(s))  MRSA PCR SCREENING     Status: None   Collection Time    07/07/13 10:57 PM      Result Value Ref Range Status   MRSA by PCR NEGATIVE  NEGATIVE Final   Comment:            The GeneXpert MRSA Assay (FDA      approved for NASAL specimens     only), is one component of a     comprehensive MRSA colonization     surveillance program. It is not     intended to diagnose MRSA     infection nor to guide or     monitor treatment for     MRSA infections.    Studies:  Recent x-ray studies have been reviewed in detail by the Attending Physician  Scheduled Meds:  Scheduled Meds: . enoxaparin (LOVENOX) injection  40 mg Subcutaneous Q24H  . insulin aspart protamine- aspart  30 Units Subcutaneous Q supper  . insulin aspart protamine- aspart  35 Units Subcutaneous Q breakfast  . potassium chloride  40 mEq Oral Once  . white petrolatum        Time spent on care of this patient: 35 min   Tajuana Kniskern T, MD  [facility] Office  [phone] Pager - Text Page per Loretha StaplerAmion as per below:  On-Call/Text Page:      [email or web address]      password [email or web address]  If 7PM-7AM, please contact night-coverage [email or web address] Password [email or web address] 07/09/2013, 11:30 AM   LOS: 2 days

## 2013-07-09 NOTE — Progress Notes (Signed)
Inpatient Diabetes Program Recommendations  AACE/ADA: New Consensus Statement on Inpatient Glycemic Control (2013)  Target Ranges:  Prepandial:   less than 140 mg/dL      Peak postprandial:   less than 180 mg/dL (1-2 hours)      Critically ill patients:  140 - 180 mg/dL   Reason for Visit: Follow-up for DKA admission.  Diabetes history: Type 1 diabetes-2 years Outpatient Diabetes medications: 70/30 35 units in the AM and 30 units with supper Current orders for Inpatient glycemic control: Patient transitioning off insulin drip to home diabetes regimen.  Spoke at length with patient regarding diabetes management and need for improved control.  His lack of resources are a huge barrier to management of his diabetes.  Gave him information regarding the [employer] patient assistance program.  [facility] which will also assist him with getting the orange card.  This patient needs close and frequent follow-up so that he can meet diabetes goals.  He admits I need to know more and take responsibility for my disease.  He discussed his fears and talked about his father dying at age 30 from diabetes complications.  Tried to encourage patient regarding management and his ability to take control of his disease.  He wants to be able to work but admits "I don't feel like it".  He also would like to re-enroll at [employer].  He is interested in information regarding [employer] and their mentor program. Discussed plan with Dr. Sharon SellerMcClung.   Thanks, Beryl MeagerJenny Kirt Chew, RN, BC-ADM Inpatient Diabetes Coordinator Pager [phone]

## 2013-07-09 NOTE — Clinical Documentation Improvement (Signed)
To Hospitalist MD's, NP;s, and PA's   Patient's BMI 17.9.  Please document diagnosis if appropriate.  Thank you    Possible Clinical conditions  Malnutrition  Underweight   Other condition  Cannot clinically determine      Risk Factors: DKA, dehydration   Treatment: Insulin drip, NS @ 75 ml/hr  Thank You, Lavonda JumboLawanda J Zelphia Glover ,RN Clinical Documentation Specialist:  [phone]  [facility]- Health Information Management

## 2013-07-09 NOTE — Plan of Care (Signed)
AG now 10 so will begin home dose of 70/30 insulin at usual home dose and begin diet. Cont insulin drip overlap x 3 more hrs.  Junious SilkAllison Chelsia Serres, ANP

## 2013-07-09 NOTE — Progress Notes (Signed)
CRITICAL VALUE ALERT  Critical value received:  K 2.8  Date of notification:  07/09/2012  Time of notification:  0000  Critical value read back:yes  Nurse who received alert:  L Hitt RN  MD notified (1st page):  Craige CottaKirby NP  Time of first page:  0000  MD notified (2nd page):  Time of second page:  Responding MD:  Craige CottaKirby NP  Time MD responded:  0000 New orders for IV K to be given. Will continue to monitor.

## 2013-07-10 DIAGNOSIS — E1010 Type 1 diabetes mellitus with ketoacidosis without coma: Secondary | ICD-10-CM | POA: Diagnosis present

## 2013-07-10 DIAGNOSIS — E1169 Type 2 diabetes mellitus with other specified complication: Secondary | ICD-10-CM

## 2013-07-10 LAB — GLUCOSE, CAPILLARY
GLUCOSE-CAPILLARY: 107 mg/dL — AB (ref 70–99)
Glucose-Capillary: 61 mg/dL — ABNORMAL LOW (ref 70–99)
Glucose-Capillary: 80 mg/dL (ref 70–99)

## 2013-07-10 LAB — MAGNESIUM: MAGNESIUM: 1.9 mg/dL (ref 1.5–2.5)

## 2013-07-10 MED ORDER — INSULIN NPH ISOPHANE & REGULAR (70-30) 100 UNIT/ML ~~LOC~~ SUSP: SUBCUTANEOUS | Status: DC

## 2013-07-10 NOTE — Progress Notes (Signed)
Inpatient Diabetes Program Recommendations  AACE/ADA: New Consensus Statement on Inpatient Glycemic Control (2013)  Target Ranges:  Prepandial:   less than 140 mg/dL      Peak postprandial:   less than 180 mg/dL (1-2 hours)      Critically ill patients:  140 - 180 mg/dL   Reason for Visit: Discussed goals with patient.  Alease FrameMartha Mitman, RN-Master student discussed personal goals with patient.  He is motivated to make life changes to improve his management of diabetes. His short-term goals are: 1) To take his insulin at the same time daily (9am and 5 pm) 2) To monitor blood sugars 4-5 times daily 3) [facility].  Will plan to contact patient by phone next week to discuss progress with goals.  Beryl MeagerJenny Fenix Rorke, RN, BC-ADM Inpatient Diabetes Coordinator Pager [phone]

## 2013-07-10 NOTE — Discharge Summary (Signed)
Physician Discharge Summary  [name] [name] PQZ:[id number] DOB: February 23, 1986 DOA: 07/07/2013  PCP: Default, Provider, MD  Admit date: 07/07/2013 Discharge date: 13-Jul-2013  Time spent: 25 minutes  Recommendations for Outpatient Follow-up:  1. [facility] his insulin 2. He will followup with them as appointment scheduled  Discharge Diagnoses:  Principal Problem:   DKA (diabetic ketoacidoses) Active Problems:   High blood pressure associated with diabetes   Dehydration   Discharge Condition: Improved, being discharged to home  Diet recommendation: Carb modified  Filed Weights   07/07/13 2215  Weight: 50.1 kg (110 lb 7.2 oz)    History of present illness:  The patient is a 28 year old African American male past history 1 diabetes who is noncompliant with his medications, however his mom lost his insurance and is not able to afford his insulin so he has been off his insulin for some time now. He presents on 2/16 with complaints of tachypnea, increased thirst and frequent urination. In the emergency room he was noted to have a white count of 11 a glucose of 46 and a calculated anion gap of 32. Patient was started on IV fluids and insulin drip and admitted to the hospitalist service.   Hospital Course:  Principal Problem:   DKA (diabetic ketoacidoses):  Act by hospital day 3, patient's anion gap had resolved and was able to weaned off the drip with an anion gap, but sugar still remained elevated so he was monitored closely in the step down unit.. A1c came back at 14.1. Patient recently moved to the state and has no insurance, It is felt best to be discharged and 70/30 Novolin Reli-on brand which is still put some cost although minimal to the patient. Case management met with the patient and set up a followup appointment immediately upon discharge with the [facility] who will be  able to provide 70/30 insulin directly to the patient. Patient and his mother were educated about need for ongoing strict control to prevent early complications or even death from DKA. By 07-13-2022, patient was able to be discharged print that point his sugars were stable and he was tolerating by mouth. A discussion of diabetes coordinator, given overall knee for controlled, limited resources, for now patient will be discharged on 70/30 twice a day insulin, 40 units in the morning and 35 at supper without a sliding scale. If compliant Bruce to be tightened up, sliding scale can be added.  Active  Problems:   High blood pressure associated with diabetes: Once DKA resolved, pressure stable    Dehydration: Secondary DKA resolved. Type 1 diabetes mellitus uncontrolled with DKA    Procedures:  None  Consultations:  Diabetes coordinator  Discharge Exam: Filed Vitals:   July 13, 2013 1230  BP:   Pulse:   Temp: 97.9 F (36.6 C)  Resp:     General: Alert and oriented x3, no acute distress Cardiovascular: Regular rate and rhythm, S1-S2 Respiratory: Clear to auscultation bilaterally  Discharge Instructions  Discharge Orders   Future Appointments Provider Department Dept Phone   08/11/2013 10:45 AM Anne Shutter, [place] [phone]   Future Orders Complete By Expires   Ambulatory referral to Nutrition and Diabetic Education  As directed    Comments:     Type 1 Diabetes for 2 years.  Getting orange card.  Will need 1:1.  A1C=14.1%   Diet Carb Modified  As  directed    Increase activity slowly  As directed        Medication List    STOP taking these medications       insulin aspart protamine- aspart (70-30) 100 UNIT/ML injection  Commonly known as:  NOVOLOG MIX 70/30      TAKE these medications       insulin NPH-regular Human (70-30) 100 UNIT/ML injection  Commonly known as:  NOVOLIN 70/30 RELION  40 units SQ in the morning with  breakfast & 35 units in evening with dinner about 5pm       No Known Allergies     Follow-up Information   Follow up with [facility]    .   Contact information:   [place] [address] [phone]      Follow up with [facility]    . (Walk ins welcome.  Go on day of discharge explain need meds and PCP.  Will get you set up. )    Contact information:   [place] [address] [phone]       The results of significant diagnostics from this hospitalization (including imaging, microbiology, ancillary and laboratory) are listed below for reference.    Significant Diagnostic Studies: Dg Chest Portable 1 View  07/07/2013   CLINICAL DATA:  Hyperglycemia, diabetes, shortness of breath  EXAM: PORTABLE CHEST - 1 VIEW  COMPARISON:  03/12/2012  FINDINGS: The heart size and mediastinal contours are within normal limits. Both lungs are clear. The visualized skeletal structures are unremarkable.  IMPRESSION: No active disease.   Electronically Signed   By: Daryll Brod M.D.   On: 07/07/2013 16:47   Dg Abd Portable 1v  07/08/2013   CLINICAL DATA:  Abdominal pain, left lower quadrant pain.  EXAM: PORTABLE ABDOMEN - 1 VIEW  COMPARISON:  03/11/2012  FINDINGS: Moderate stool throughout the colon. Nonobstructive bowel gas pattern. No free air. No organomegaly or suspicious calcification. No acute bony abnormality.  IMPRESSION: No evidence of bowel obstruction or free air.  No acute findings.   Electronically Signed   By: Rolm Baptise M.D.   On: 07/08/2013 06:56    Microbiology: Recent Results (from the past 240 hour(s))  MRSA PCR SCREENING     Status: None   Collection Time    07/07/13 10:57 PM      Result Value Ref Range Status   MRSA by PCR NEGATIVE  NEGATIVE Final   Comment:            The GeneXpert MRSA Assay (FDA     approved for NASAL specimens     only), is one component of a      comprehensive MRSA colonization     surveillance program. It is not     intended to diagnose MRSA     infection nor to guide or     monitor treatment for     MRSA infections.     Labs: Basic Metabolic Panel:  Recent Labs Lab 07/08/13 2250 07/09/13 0446 07/09/13 0530 07/09/13 1000 07/09/13 1500 07/10/13 0242  NA 141 139 138 134* 131*  --   K 2.8* 3.0* 3.0* 3.4* 3.7  --   CL 108 108 108 100 100  --   CO2 _0 --   GLUCOSE 82 130* 99 283* 354*  --   BUN 5* 5* 5* 5* 5*  --   CREATININE 0.60  0.61 0.58 0.58 0.57  --   CALCIUM 8.2* 7.9* 8.0* 8.1* 8.4  --   MG  --   --   --   --   --  1.9   Liver Function Tests:  Recent Labs Lab 07/07/13 1635  AST 32  ALT 19  ALKPHOS 128*  BILITOT <0.2*  PROT 8.2  ALBUMIN 4.2   No results found for this basename: LIPASE, AMYLASE,  in the last 168 hours No results found for this basename: AMMONIA,  in the last 168 hours CBC:  Recent Labs Lab 07/07/13 1635 07/08/13 0243  WBC 11.0* 7.0  HGB 14.8 10.6*  HCT 41.3 29.5*  MCV 91.0 87.5  PLT 255 167   Cardiac Enzymes: No results found for this basename: CKTOTAL, CKMB, CKMBINDEX, TROPONINI,  in the last 168 hours BNP: BNP (last 3 results) No results found for this basename: PROBNP,  in the last 8760 hours CBG:  Recent Labs Lab 07/09/13 1602 07/09/13 2132 07/09/13 2219 07/10/13 0733 07/10/13 1247  GLUCAP 346* 61* 122* 107* 80       Signed:  , K  [facility] 07/10/2013, 2:43 PM

## 2013-07-10 NOTE — Progress Notes (Signed)
Pt to d/c home today; to visit clinic at d/c. Reviewed appt, inst, etc.

## 2013-07-10 NOTE — Discharge Instructions (Signed)
Type 1 Diabetes Mellitus, Adult Type 1 diabetes mellitus, often simply referred to as diabetes, is a long-term (chronic) disease. It occurs when the islet cells in the pancreas that make insulin (a hormone) are destroyed and can no longer make insulin. Insulin is needed to move sugars from food into the tissue cells. The tissue cells use the sugars for energy. In people with type 1 diabetes, the sugars build up in the blood instead of going into the tissue cells. As a result, high blood sugar (hyperglycemia) develops. Without insulin, the body breaks down fat cells for the needed energy. This breakdown of fat cells produces acid chemicals (ketones), which increases the acid levels in the body. The effect of either high ketone or sugar (glucose) levels can be life-threatening.  Type 1 diabetes was also previously called juvenile diabetes. It most often occurs before the age of 30, but it can occur at any age. RISK FACTORS A person is predisposed to developing type 1 diabetes if someone in his or her family has the disease and is exposed to certain additional environmental triggers.  SYMPTOMS  Symptoms of type 1 diabetes may develop gradually over days to weeks or suddenly. The symptoms occur due to hyperglycemia. The symptoms can include:   Increased thirst (polydipsia).  Increased urination (polyuria).  Increased urination during the night (nocturia).  Weight loss. This weight loss may be rapid.  Frequent, recurring infections.  Tiredness (fatigue).  Weakness.  Vision changes, such as blurred vision.  Fruity smell to your breath.  Abdominal pain.  Nausea or vomiting. DIAGNOSIS  Type 1 diabetes is diagnosed when symptoms of diabetes are present and when blood glucose levels are increased. Your blood glucose level may be checked by one or more of the following blood tests:  A fasting blood glucose test. You will not be allowed to eat for at least 8 hours before a blood sample is  taken.  A random blood glucose test. Your blood glucose is checked at any time of the day regardless of when you ate.  A hemoglobin A1c blood glucose test. A hemoglobin A1c test provides information about blood glucose control over the previous 3 months. TREATMENT  Although type 1 diabetes cannot be prevented, it can be managed with insulin, diet, and exercise.  You will need to take insulin daily to keep blood glucose in the desired range.  You will need to match insulin dosing with exercise and healthy food choices. The treatment goal is to maintain the before-meal blood sugar (preprandial glucose) level at 70 130 mg/dL.  HOME CARE INSTRUCTIONS   Have your hemoglobin A1c level checked twice a year.  Perform daily blood glucose monitoring as directed by your caregiver.  Monitor urine ketones when you are ill and as directed by your caregiver.  Take your insulin as directed by your caregiver to maintain your blood glucose level in the desired range.  Never run out of insulin. It is needed every day.  Adjust insulin based on your intake of carbohydrates. Carbohydrates can raise blood glucose levels but need to be included in your diet. Carbohydrates provide vitamins, minerals, and fiber, which are an essential part of a healthy diet. Carbohydrates are found in fruits, vegetables, whole grains, dairy products, legumes, and foods containing added sugars.    Eat healthy foods. Alternate 3 meals with 3 snacks.  Maintain a healthy weight.  Carry a medical alert card or wear your medical alert jewelry.  Carry a 15 gram carbohydrate snack with you   at all times to treat low blood glucose (hypoglycemia). Some examples of 15 gram carbohydrate snacks include:  Glucose tablets, 3 or 4.   Glucose gel, 15 gram tube.  Raisins, 2 tablespoons (24 grams).  Jelly beans, 6.  Animal crackers, 8.  Fruit juice, regular soda, or low-fat milk, 4 ounces (120 mL).  Gummy treats,  9.    Recognize hypoglycemia. Hypoglycemia occurs with blood glucose levels of 70 mg/dL and below. The risk for hypoglycemia increases when fasting or skipping meals, during or after intense exercise, and during sleep. Hypoglycemia symptoms can include:  Tremors or shakes.  Decreased ability to concentrate.  Sweating.  Increased heart rate.  Headache.  Dry mouth.  Hunger.  Irritability.  Anxiety.  Restless sleep.  Altered speech or coordination.  Confusion.  Treat hypoglycemia promptly. If you are alert and able to safely swallow, follow the 15:15 rule:  Take 15 20 grams of rapid-acting glucose or carbohydrate. Rapid-acting options include glucose gel, glucose tablets, or 4 ounces (120 mL) of fruit juice, regular soda, or low-fat milk.  Check your blood glucose level 15 minutes after taking the glucose.   Take 15 20 grams more of glucose if the repeat blood glucose level is still 70 mg/dL or below.  Eat a meal or snack within 1 hour once blood glucose levels return to normal.  Be alert to polyuria and polydipsia, which are early signs of hyperglycemia. An early awareness of hyperglycemia allows for prompt treatment. Treat hyperglycemia as directed by your caregiver.  Engage in at least 150 minutes of moderate-intensity physical activity a week, spread over at least 3 days of the week or as directed by your caregiver.  Adjust your insulin dosing and food intake as needed if you start a new exercise or sport.  Follow your sick day plan at any time you are unable to eat or drink as usual.   Avoid tobacco use.  Limit alcohol intake to no more than 1 drink per day for nonpregnant women and 2 drinks per day for men. You should drink alcohol only when you are also eating food. Talk with your caregiver about whether alcohol is safe for you. Tell your caregiver if you drink alcohol several times a week.  Follow up with your caregiver regularly.  Schedule an eye exam  within 5 years of diagnosis and then annually.  Perform daily skin and foot care. Examine your skin and feet daily for cuts, bruises, redness, nail problems, bleeding, blisters, or sores. A foot exam by a caregiver should be done annually.  Brush your teeth and gums at least twice a day and floss at least once a day. Follow up with your dentist regularly.  Share your diabetes management plan with your workplace or school.  Stay up-to-date with immunizations.  Learn to manage stress.  Obtain ongoing diabetes education and support as needed.  Participate or seek rehabilitation as needed to maintain or improve independence and quality of life. Request a physical or occupational therapy referral if you are having foot or hand numbness or difficulties with grooming, dressing, eating, or physical activity. SEEK MEDICAL CARE IF:   You are unable to eat food or drink fluids for more than 6 hours.  You have nausea and vomiting for more than 6 hours.  Your blood glucose level is over 240 mg/dL.  There is a change in mental status.  You develop an additional serious illness.  You have diarrhea for more than 6 hours.  You have been   sick or have had a fever for a couple of days and are not getting better.  You have pain during any physical activity. SEEK IMMEDIATE MEDICAL CARE IF:  You have difficulty breathing.  You have moderate to large ketone levels. MAKE SURE YOU:  Understand these instructions.  Will watch your condition.  Will get help right away if you are not doing well or get worse. Document Released: 05/05/2000 Document Revised: 01/31/2012 Document Reviewed: 12/05/2011 [employer] Patient Information 2014 [employer].  

## 2013-07-22 ENCOUNTER — Telehealth: Payer: Self-pay

## 2013-07-22 NOTE — Telephone Encounter (Signed)
Called pt to scheduled HFU for diabetes, pt currently on insulin.

## 2013-08-11 ENCOUNTER — Ambulatory Visit: Payer: Self-pay | Admitting: *Deleted

## 2014-04-24 ENCOUNTER — Inpatient Hospital Stay (HOSPITAL_COMMUNITY): Payer: MEDICAID

## 2014-04-24 ENCOUNTER — Other Ambulatory Visit: Payer: Self-pay

## 2014-04-24 ENCOUNTER — Emergency Department (HOSPITAL_COMMUNITY): Payer: Self-pay

## 2014-04-24 ENCOUNTER — Encounter (HOSPITAL_COMMUNITY): Payer: Self-pay | Admitting: Emergency Medicine

## 2014-04-24 ENCOUNTER — Inpatient Hospital Stay (HOSPITAL_COMMUNITY)
Admission: EM | Admit: 2014-04-24 | Discharge: 2014-04-28 | DRG: 639 | Disposition: A | Discharge status: Home / Self Care | Payer: Self-pay | Attending: Internal Medicine | Admitting: Internal Medicine

## 2014-04-24 DIAGNOSIS — D72829 Elevated white blood cell count, unspecified: Secondary | ICD-10-CM | POA: Diagnosis present

## 2014-04-24 DIAGNOSIS — R000 Tachycardia, unspecified: Secondary | ICD-10-CM | POA: Diagnosis present

## 2014-04-24 DIAGNOSIS — F17210 Nicotine dependence, cigarettes, uncomplicated: Secondary | ICD-10-CM | POA: Diagnosis present

## 2014-04-24 DIAGNOSIS — E860 Dehydration: Secondary | ICD-10-CM | POA: Diagnosis present

## 2014-04-24 DIAGNOSIS — F1210 Cannabis abuse, uncomplicated: Secondary | ICD-10-CM | POA: Diagnosis present

## 2014-04-24 DIAGNOSIS — Z9114 Patient's other noncompliance with medication regimen: Secondary | ICD-10-CM | POA: Diagnosis present

## 2014-04-24 DIAGNOSIS — E1010 Type 1 diabetes mellitus with ketoacidosis without coma: Principal | ICD-10-CM | POA: Diagnosis present

## 2014-04-24 DIAGNOSIS — E876 Hypokalemia: Secondary | ICD-10-CM | POA: Diagnosis present

## 2014-04-24 DIAGNOSIS — E785 Hyperlipidemia, unspecified: Secondary | ICD-10-CM

## 2014-04-24 DIAGNOSIS — R030 Elevated blood-pressure reading, without diagnosis of hypertension: Secondary | ICD-10-CM | POA: Diagnosis present

## 2014-04-24 DIAGNOSIS — E1110 Type 2 diabetes mellitus with ketoacidosis without coma: Secondary | ICD-10-CM

## 2014-04-24 DIAGNOSIS — E1065 Type 1 diabetes mellitus with hyperglycemia: Secondary | ICD-10-CM | POA: Diagnosis present

## 2014-04-24 DIAGNOSIS — F1910 Other psychoactive substance abuse, uncomplicated: Secondary | ICD-10-CM | POA: Diagnosis present

## 2014-04-24 DIAGNOSIS — IMO0001 Reserved for inherently not codable concepts without codable children: Secondary | ICD-10-CM | POA: Diagnosis present

## 2014-04-24 DIAGNOSIS — Z91148 Patient's other noncompliance with medication regimen for other reason: Secondary | ICD-10-CM | POA: Diagnosis present

## 2014-04-24 LAB — BLOOD GAS, ARTERIAL
ACID-BASE DEFICIT: 20.6 mmol/L — AB (ref 0.0–2.0)
Acid-base deficit: 28.8 mmol/L — ABNORMAL HIGH (ref 0.0–2.0)
Bicarbonate: 1.9 mEq/L — ABNORMAL LOW (ref 20.0–24.0)
Bicarbonate: 6.3 mEq/L — ABNORMAL LOW (ref 20.0–24.0)
DRAWN BY: 35135
DRAWN BY: 270221
FIO2: 0.21 %
O2 SAT: 97.9 %
O2 Saturation: 97 %
PATIENT TEMPERATURE: 98.6
PH ART: 6.864 — AB (ref 7.350–7.450)
PO2 ART: 137 mmHg — AB (ref 80.0–100.0)
Patient temperature: 97.1
TCO2: 2.3 mmol/L (ref 0–100)
TCO2: 6.9 mmol/L (ref 0–100)
pCO2 arterial: 11.1 mmHg — CL (ref 35.0–45.0)
pCO2 arterial: 17.3 mmHg — CL (ref 35.0–45.0)
pH, Arterial: 7.188 — CL (ref 7.350–7.450)
pO2, Arterial: 117 mmHg — ABNORMAL HIGH (ref 80.0–100.0)

## 2014-04-24 LAB — URINALYSIS, ROUTINE W REFLEX MICROSCOPIC
Bilirubin Urine: NEGATIVE
Glucose, UA: >1000 mg/dL — AB
Ketones, ur: >80 mg/dL — AB
Leukocytes, UA: NEGATIVE
Nitrite: NEGATIVE
PROTEIN: 30 mg/dL — AB
SPECIFIC GRAVITY, URINE: 1.017 (ref 1.005–1.030)
UROBILINOGEN UA: 0.2 mg/dL (ref 0.0–1.0)
pH: 5 (ref 5.0–8.0)

## 2014-04-24 LAB — COMPREHENSIVE METABOLIC PANEL
ALK PHOS: 78 U/L (ref 39–117)
ALT: 12 U/L (ref 0–53)
AST: 38 U/L — ABNORMAL HIGH (ref 0–37)
Albumin: 4.6 g/dL (ref 3.5–5.2)
BILIRUBIN TOTAL: 0.2 mg/dL — AB (ref 0.3–1.2)
BUN: 15 mg/dL (ref 6–23)
CHLORIDE: 92 meq/L — AB (ref 96–112)
CREATININE: 0.84 mg/dL (ref 0.50–1.35)
Calcium: 9.7 mg/dL (ref 8.4–10.5)
GFR calc Af Amer: >90 mL/min (ref >90)
GFR calc non Af Amer: >90 mL/min (ref >90)
GLUCOSE: 507 mg/dL — AB (ref 70–99)
Potassium: 6.2 mEq/L — ABNORMAL HIGH (ref 3.7–5.3)
Sodium: 131 mEq/L — ABNORMAL LOW (ref 137–147)
Total Protein: 8.9 g/dL — ABNORMAL HIGH (ref 6.0–8.3)

## 2014-04-24 LAB — CBC WITH DIFFERENTIAL/PLATELET
BASOS ABS: 0 10*3/uL (ref 0.0–0.1)
BASOS ABS: 0 10*3/uL (ref 0.0–0.1)
Basophils Relative: 0 % (ref 0–1)
Basophils Relative: 0 % (ref 0–1)
EOS ABS: 0 10*3/uL (ref 0.0–0.7)
EOS PCT: 0 % (ref 0–5)
EOS PCT: 0 % (ref 0–5)
Eosinophils Absolute: 0 10*3/uL (ref 0.0–0.7)
HCT: 44.6 % (ref 39.0–52.0)
HCT: 40.5 % (ref 39.0–52.0)
Hemoglobin: 15.4 g/dL (ref 13.0–17.0)
Hemoglobin: 13.7 g/dL (ref 13.0–17.0)
LYMPHS ABS: 2.5 10*3/uL (ref 0.7–4.0)
LYMPHS PCT: 7 % — AB (ref 12–46)
Lymphocytes Relative: 14 % (ref 12–46)
Lymphs Abs: 1.3 10*3/uL (ref 0.7–4.0)
MCH: 30 pg (ref 26.0–34.0)
MCH: 29.3 pg (ref 26.0–34.0)
MCHC: 34.5 g/dL (ref 30.0–36.0)
MCHC: 33.8 g/dL (ref 30.0–36.0)
MCV: 86.8 fL (ref 78.0–100.0)
MCV: 86.7 fL (ref 78.0–100.0)
Monocytes Absolute: 0.7 10*3/uL (ref 0.1–1.0)
Monocytes Absolute: 1.9 10*3/uL — ABNORMAL HIGH (ref 0.1–1.0)
Monocytes Relative: 4 % (ref 3–12)
Monocytes Relative: 10 % (ref 3–12)
NEUTROS PCT: 83 % — AB (ref 43–77)
Neutro Abs: 15 10*3/uL — ABNORMAL HIGH (ref 1.7–7.7)
Neutro Abs: 16.4 10*3/uL — ABNORMAL HIGH (ref 1.7–7.7)
Neutrophils Relative %: 82 % — ABNORMAL HIGH (ref 43–77)
PLATELETS: 274 10*3/uL (ref 150–400)
Platelets: 349 10*3/uL (ref 150–400)
RBC: 5.14 MIL/uL (ref 4.22–5.81)
RBC: 4.67 MIL/uL (ref 4.22–5.81)
RDW: 13.3 % (ref 11.5–15.5)
RDW: 13.2 % (ref 11.5–15.5)
WBC: 18.2 10*3/uL — ABNORMAL HIGH (ref 4.0–10.5)
WBC: 19.6 10*3/uL — ABNORMAL HIGH (ref 4.0–10.5)

## 2014-04-24 LAB — RAPID URINE DRUG SCREEN, HOSP PERFORMED
Amphetamines: NOT DETECTED
Barbiturates: NOT DETECTED
Benzodiazepines: NOT DETECTED
Cocaine: NOT DETECTED
OPIATES: NOT DETECTED
TETRAHYDROCANNABINOL: POSITIVE — AB

## 2014-04-24 LAB — GLUCOSE, CAPILLARY
GLUCOSE-CAPILLARY: 129 mg/dL — AB (ref 70–99)
GLUCOSE-CAPILLARY: 170 mg/dL — AB (ref 70–99)
GLUCOSE-CAPILLARY: 181 mg/dL — AB (ref 70–99)
GLUCOSE-CAPILLARY: 224 mg/dL — AB (ref 70–99)
GLUCOSE-CAPILLARY: 229 mg/dL — AB (ref 70–99)
GLUCOSE-CAPILLARY: 238 mg/dL — AB (ref 70–99)
GLUCOSE-CAPILLARY: 272 mg/dL — AB (ref 70–99)
Glucose-Capillary: 306 mg/dL — ABNORMAL HIGH (ref 70–99)
Glucose-Capillary: 287 mg/dL — ABNORMAL HIGH (ref 70–99)
Glucose-Capillary: 258 mg/dL — ABNORMAL HIGH (ref 70–99)
Glucose-Capillary: 242 mg/dL — ABNORMAL HIGH (ref 70–99)
Glucose-Capillary: 217 mg/dL — ABNORMAL HIGH (ref 70–99)
Glucose-Capillary: 213 mg/dL — ABNORMAL HIGH (ref 70–99)
Glucose-Capillary: 194 mg/dL — ABNORMAL HIGH (ref 70–99)
Glucose-Capillary: 148 mg/dL — ABNORMAL HIGH (ref 70–99)

## 2014-04-24 LAB — POCT I-STAT 3, VENOUS BLOOD GAS (G3P V)
Acid-base deficit: 28 mmol/L — ABNORMAL HIGH (ref 0.0–2.0)
Bicarbonate: 3.3 mEq/L — ABNORMAL LOW (ref 20.0–24.0)
O2 SAT: 61 %
PCO2 VEN: 16.5 mmHg — AB (ref 45.0–50.0)
PH VEN: 6.908 — AB (ref 7.250–7.300)
PO2 VEN: 51 mmHg — AB (ref 30.0–45.0)
TCO2: <5 mmol/L (ref 0–100)

## 2014-04-24 LAB — BASIC METABOLIC PANEL
ANION GAP: 18 — AB (ref 5–15)
BUN: 15 mg/dL (ref 6–23)
BUN: 14 mg/dL (ref 6–23)
BUN: 9 mg/dL (ref 6–23)
BUN: 8 mg/dL (ref 6–23)
CHLORIDE: 113 meq/L — AB (ref 96–112)
CHLORIDE: 113 meq/L — AB (ref 96–112)
CO2: <7 mEq/L — CL (ref 19–32)
CO2: <7 mEq/L — CL (ref 19–32)
CO2: <7 mEq/L — CL (ref 19–32)
CO2: 9 mEq/L — CL (ref 19–32)
Calcium: 7.4 mg/dL — ABNORMAL LOW (ref 8.4–10.5)
Calcium: 7.5 mg/dL — ABNORMAL LOW (ref 8.4–10.5)
Calcium: 7.8 mg/dL — ABNORMAL LOW (ref 8.4–10.5)
Calcium: 7.6 mg/dL — ABNORMAL LOW (ref 8.4–10.5)
Chloride: 110 mEq/L (ref 96–112)
Chloride: 112 mEq/L (ref 96–112)
Creatinine, Ser: 0.86 mg/dL (ref 0.50–1.35)
Creatinine, Ser: 0.82 mg/dL (ref 0.50–1.35)
Creatinine, Ser: 0.72 mg/dL (ref 0.50–1.35)
Creatinine, Ser: 0.64 mg/dL (ref 0.50–1.35)
GFR calc Af Amer: >90 mL/min (ref >90)
Glucose, Bld: 298 mg/dL — ABNORMAL HIGH (ref 70–99)
Glucose, Bld: 246 mg/dL — ABNORMAL HIGH (ref 70–99)
Glucose, Bld: 256 mg/dL — ABNORMAL HIGH (ref 70–99)
Glucose, Bld: 193 mg/dL — ABNORMAL HIGH (ref 70–99)
POTASSIUM: 4.2 meq/L (ref 3.7–5.3)
POTASSIUM: 4.3 meq/L (ref 3.7–5.3)
POTASSIUM: 4.3 meq/L (ref 3.7–5.3)
POTASSIUM: 2.8 meq/L — AB (ref 3.7–5.3)
SODIUM: 141 meq/L (ref 137–147)
SODIUM: 142 meq/L (ref 137–147)
SODIUM: 142 meq/L (ref 137–147)
SODIUM: 140 meq/L (ref 137–147)

## 2014-04-24 LAB — URINE MICROSCOPIC-ADD ON

## 2014-04-24 LAB — HEMOGLOBIN A1C
HEMOGLOBIN A1C: 11.6 % — AB (ref <5.7)
MEAN PLASMA GLUCOSE: 286 mg/dL — AB (ref <117)

## 2014-04-24 LAB — MRSA PCR SCREENING: MRSA BY PCR: NEGATIVE

## 2014-04-24 LAB — CBG MONITORING, ED
GLUCOSE-CAPILLARY: 458 mg/dL — AB (ref 70–99)
GLUCOSE-CAPILLARY: 385 mg/dL — AB (ref 70–99)
Glucose-Capillary: 486 mg/dL — ABNORMAL HIGH (ref 70–99)

## 2014-04-24 LAB — TSH: TSH: 1.1 u[IU]/mL (ref 0.350–4.500)

## 2014-04-24 LAB — D-DIMER, QUANTITATIVE: D-Dimer, Quant: 0.86 ug/mL-FEU — ABNORMAL HIGH (ref 0.00–0.48)

## 2014-04-24 MED ORDER — SODIUM CHLORIDE 0.9 % IV SOLN
1000.0000 mL | Freq: Once | INTRAVENOUS | Status: AC
  Administered 2014-04-24: 1000 mL via INTRAVENOUS

## 2014-04-24 MED ORDER — ENOXAPARIN SODIUM 40 MG/0.4ML ~~LOC~~ SOLN
40.0000 mg | SUBCUTANEOUS | Status: DC
  Administered 2014-04-24 – 2014-04-27 (×4): 40 mg via SUBCUTANEOUS
  Filled 2014-04-24 (×5): qty 0.4

## 2014-04-24 MED ORDER — HYDRALAZINE HCL 20 MG/ML IJ SOLN: 10.0000 mg | INTRAMUSCULAR | Status: DC | PRN

## 2014-04-24 MED ORDER — DEXTROSE-NACL 5-0.45 % IV SOLN
INTRAVENOUS | Status: DC
  Administered 2014-04-24: 11:00:00 via INTRAVENOUS

## 2014-04-24 MED ORDER — SODIUM CHLORIDE 0.9 % IV SOLN: 1000.0000 mL | INTRAVENOUS | Status: DC

## 2014-04-24 MED ORDER — PANTOPRAZOLE SODIUM 40 MG IV SOLR
40.0000 mg | INTRAVENOUS | Status: DC
  Administered 2014-04-24 – 2014-04-26 (×3): 40 mg via INTRAVENOUS
  Filled 2014-04-24 (×4): qty 40

## 2014-04-24 MED ORDER — INSULIN REGULAR BOLUS VIA INFUSION
0.0000 [IU] | Freq: Three times a day (TID) | INTRAVENOUS | Status: DC
  Filled 2014-04-24: qty 10

## 2014-04-24 MED ORDER — SODIUM CHLORIDE 0.9 % IV SOLN: INTRAVENOUS | Status: DC

## 2014-04-24 MED ORDER — SODIUM CHLORIDE 0.9 % IV BOLUS (SEPSIS)
1000.0000 mL | Freq: Once | INTRAVENOUS | Status: AC
  Administered 2014-04-24: 1000 mL via INTRAVENOUS

## 2014-04-24 MED ORDER — POTASSIUM CHLORIDE 10 MEQ/100ML IV SOLN
10.0000 meq | INTRAVENOUS | Status: AC
  Administered 2014-04-24 (×4): 10 meq via INTRAVENOUS

## 2014-04-24 MED ORDER — DEXTROSE 50 % IV SOLN: 25.0000 mL | INTRAVENOUS | Status: DC | PRN

## 2014-04-24 MED ORDER — DEXTROSE-NACL 5-0.45 % IV SOLN: INTRAVENOUS | Status: DC

## 2014-04-24 MED ORDER — SODIUM CHLORIDE 0.9 % IV SOLN
INTRAVENOUS | Status: DC
  Administered 2014-04-24: 3.4 [IU]/h via INTRAVENOUS
  Filled 2014-04-24: qty 2.5

## 2014-04-24 MED ORDER — IOHEXOL 350 MG/ML SOLN
100.0000 mL | Freq: Once | INTRAVENOUS | Status: AC | PRN
  Administered 2014-04-24: 100 mL via INTRAVENOUS

## 2014-04-24 MED ORDER — SODIUM CHLORIDE 0.9 % IV SOLN
INTRAVENOUS | Status: DC
  Filled 2014-04-24: qty 2.5

## 2014-04-24 MED ORDER — POTASSIUM CHLORIDE 10 MEQ/100ML IV SOLN
10.0000 meq | INTRAVENOUS | Status: AC
  Administered 2014-04-24 (×3): 10 meq via INTRAVENOUS

## 2014-04-24 MED ORDER — SODIUM CHLORIDE 0.9 % IV SOLN
INTRAVENOUS | Status: DC
  Administered 2014-04-24: 07:00:00 via INTRAVENOUS

## 2014-04-24 NOTE — Progress Notes (Signed)
CRITICAL VALUE ALERT  Critical value received:  CO2 <7  Date of notification:  04/24/14  Time of notification:  1147  Critical value read back:Yes.    Nurse who received alert:  Buckner Maltaracie Sukhman Martine  MD notified (1st page):  Dr. Benjamine MolaVann  Time of first page:  1150  MD notified (2nd page):  Time of second page:  Responding MD:  Dr. Benjamine MolaVann  Time MD responded:  1153

## 2014-04-24 NOTE — Progress Notes (Signed)
CRITICAL VALUE ALERT  Critical value received:  CO2 <7  Date of notification:  04/24/14  Time of notification:  1708  Critical value read back:Yes.    Nurse who received alert:  Buckner Maltaracie Kina Shiffman   MD notified (1st page):  Dr. Benjamine MolaVann  Time of first page:  1711  MD notified (2nd page):  Time of second page:  Responding MD:  Dr. Benjamine MolaVann  Time MD responded:  [phone]

## 2014-04-24 NOTE — H&P (Signed)
[facility] History and Physical  [name] [name] ZOX:[id number] DOB: [birth year] DOA: 04/24/2014  Referring physician: ER physician. PCP: Default, Provider, MD   Chief Complaint: Shortness of breath.  HPI: [name] Mastnthony Tipps is a 28 y.o. male with history of diabetes mellitus type 1 presents to the ER because of shortness of breath. Patient states that he became short of breath last night suddenly. Patient denies any chest pain or productive cough fever chills. Patient states he did not take his insulin yesterday because he was not feeling well. In the ER patient was found to have elevated anion gap with elevated blood sugar and patient was found to be having a pH of 6.9 in the VBG. Critical care was consulted by the ER physician and at this time patient is being admitted for severe DKA. Patient states that he has not missed his medications except for yesterday. Denies any nausea vomiting abdominal pain diarrhea fever chills.   Review of Systems: As presented in the history of presenting illness, rest negative.  Past Medical History  Diagnosis Date  . Diabetes mellitus without complication    History reviewed. No pertinent past surgical history. Social History:  reports that he has been smoking.  He does not have any smokeless tobacco history on file. He reports that he does not drink alcohol or use illicit drugs. Where does patient live home. Can patient participate in ADLs? Yes.  No Known Allergies  Family History: History reviewed. No pertinent family history.    Prior to Admission medications   Medication Sig Start Date End Date Taking? Authorizing Provider  insulin NPH-regular Human (NOVOLIN 70/30 RELION) (70-30) 100 UNIT/ML injection 40 units SQ in the morning with breakfast & 35 units in evening with dinner about 5pm 07/10/13  Yes Hollice EspySendil K Krishnan, MD    Physical Exam: Filed Vitals:   04/24/14 0410 04/24/14 0430 04/24/14 0445 04/24/14 0500  BP: 158/104 166/106 163/88 158/80   Pulse: 124 125 123 124  Temp:      TempSrc:      Resp: 23 21 19 21   Height:      Weight:      SpO2: 100% 100% 100% 100%     General:  Moderately built and nourished.  Eyes: Anicteric no pallor.  ENT: No discharge from the ears eyes nose more.  Neck: No mass felt.  Cardiovascular: S1 and S2 heard tachycardic.  Respiratory: No rhonchi or crepitations.  Abdomen: Soft nontender bowel sounds present.  Skin: No rash.  Musculoskeletal: No edema.  Psychiatric: Appears normal.  Neurologic: Alert awake oriented to time place and person. Moves all extremities.  Labs on Admission:  Basic Metabolic Panel:  Recent Labs Lab 04/24/14 0250  NA 131*  K 6.2*  CL 92*  CO2 <7*  GLUCOSE 507*  BUN 15  CREATININE 0.84  CALCIUM 9.7   Liver Function Tests:  Recent Labs Lab 04/24/14 0250  AST 38*  ALT 12  ALKPHOS 78  BILITOT 0.2*  PROT 8.9*  ALBUMIN 4.6   No results for input(s): LIPASE, AMYLASE in the last 168 hours. No results for input(s): AMMONIA in the last 168 hours. CBC:  Recent Labs Lab 04/24/14 0250  WBC 18.2*  NEUTROABS 15.0*  HGB 15.4  HCT 44.6  MCV 86.8  PLT 349   Cardiac Enzymes: No results for input(s): CKTOTAL, CKMB, CKMBINDEX, TROPONINI in the last 168 hours.  BNP (last 3 results) No results for input(s): PROBNP in the last 8760 hours. CBG:  Recent Labs Lab 04/24/14  0230 04/24/14 0424  GLUCAP 486* 458*    Radiological Exams on Admission: Dg Chest Port 1 View  04/24/2014   CLINICAL DATA:  Acute onset of shortness of breath. Hyperglycemia. Initial encounter.  EXAM: PORTABLE CHEST - 1 VIEW  COMPARISON:  Chest radiograph performed 07/07/2013  FINDINGS: The lungs are well-aerated and clear. There is no evidence of focal opacification, pleural effusion or pneumothorax.  The cardiomediastinal silhouette is within normal limits. No acute osseous abnormalities are seen.  IMPRESSION: No acute cardiopulmonary process seen.   Electronically Signed    By: Roanna RaiderJeffery  Chang M.D.   On: 04/24/2014 04:21     Assessment/Plan Principal Problem:   DKA, type 1 Active Problems:   Elevated blood pressure   1. Severe diabetic ketoacidosis - precipitating cause is not clear. Patient did miss one dose of insulin yesterday. At this time we will check troponins urinalysis. Patient is quite tachycardic probably from dehydration but we will also check d-dimer and thyroid function test and urine drug screen. Patient has been started on IV insulin infusion along with IV fluids which will be continued until anion gap is corrected and patient will be closely monitored in the stepdown unit. Check hemoglobin A1c. 2. Elevated blood pressure - closely observe blood pressure trends and patient has high blood pressure elevated during his admissions. Patient is not on any antihypertensives. For now I have kept patient on when necessary IV hydralazine for systolic blood pressure more than 160. 3. Leukocytosis - probably reactionary patient is afebrile UA is pending.  EKG is pending.    Code Status: Full code.  Family Communication: None.  Disposition Plan: Admit to inpatient.    Mikaila Grunert N. [facility] Pager [phone].  If 7PM-7AM, please contact night-coverage [email or web address] Password [email or web address] 04/24/2014, 5:16 AM

## 2014-04-24 NOTE — ED Notes (Signed)
Pt is from home. Pt is diabetic, and has not taken his insulin tonight due to not eating. Pt states that he is short of breath, and can only answer in one to two words. Pt with hx of DKA. Pt's mother states that this presentation is normal for him when he is hyperglycemic.

## 2014-04-24 NOTE — Progress Notes (Signed)
Patient admitted after midnight- please see H&P.  Labs pending-  Severe diabetic ketoacidosis - precipitating cause is not clear. Patient did miss one dose of insulin yesterday.  -troponins - urinalysis.  -Patient is quite tachycardic probably from dehydration  -check d-dimer and thyroid function test and urine drug screen.  -IV insulin infusion along with IV fluids which will be continued until anion gap is corrected and patient will be closely monitored in the stepdown unit. Check hemoglobin A1c.  Elevated blood pressure - closely observe blood pressure trends and patient has high blood pressure elevated during his admissions. Patient is not on any antihypertensives.  - IV hydralazine for systolic blood pressure more than 160.  Leukocytosis - probably reactionary patient is afebrile UA is pending. Marlin CanaryJessica Marielis Samara DO

## 2014-04-24 NOTE — Progress Notes (Addendum)
Inpatient Diabetes Program Recommendations  AACE/ADA: New Consensus Statement on Inpatient Glycemic Control (2013)  Target Ranges:  Prepandial:   less than 140 mg/dL      Peak postprandial:   less than 180 mg/dL (1-2 hours)      Critically ill patients:  140 - 180 mg/dL     Results for [name], Jawon (MRN [id number]) as of 04/24/2014 09:20  Ref. Range 04/24/2014 02:50  Sodium Latest Range: 137-147 mEq/L 131 (L)  Potassium Latest Range: 3.7-5.3 mEq/L 6.2 (H)  Chloride Latest Range: 96-112 mEq/L 92 (L)  CO2 Latest Range: 19-32 mEq/L <7 (LL)  BUN Latest Range: 6-23 mg/dL 15  Creatinine Latest Range: 0.50-1.35 mg/dL 4.090.84  Calcium Latest Range: 8.4-10.5 mg/dL 9.7  GFR calc non Af Amer Latest Range: >90 mL/min >90  GFR calc Af Amer Latest Range: >90 mL/min >90  Glucose Latest Range: 70-99 mg/dL 811507 (H)  Anion gap Latest Range: 5-15  NOT CALCULATED    **Admitted with SOB and DKA.  Per MD notes, patient did not feel well and did not take insulin the day prior to admission.  **Last A1c back in February of this year was 14.1% (07/07/13).  **Patient was counseled extensively about the importance of glucose control and was given DM goals to follow at home by the DM Coordinator back in February of this year.    **"Alease FrameMartha Mitman, RN-Master student discussed personal goals with patient. He is motivated to make life changes to improve his management of diabetes. His short-term goals are: 1) To take his insulin at the same time daily (9am and 5 pm) 2) To monitor blood sugars 4-5 times daily 3) [facility]."  **Not sure if patient ever followed through with these home DM care goals since last admission in February.  Will touch base with patient today to assess his learning needs.    Home DM Meds: 70/30 insulin- 40 units breakfast/ 35 units with supper  Current Insulin Orders: IV insulin drip    MD- Once patient's acidosis has cleared and his blood glucose is  stable, please make sure patient receives his home dose of 70/30 insulin at least 1 hour before d/c of IV insulin drip   Addendum 1250pm: Spoke with patient about his admission.  Discussed what DKA is and the treatment we are providing for him.  Verified home insulin doses with patient (70/30 insulin- 40 units AM and 35 units PM).  Explained to patient that we need to make sure his acidosis is clear, he is well hydrated, and his blood sugars are stable before we transition off the IV insulin drip.  Reminded patient about the importance of good glucose control to prevent both acute and long-term complications.  Patient stated he missed his suppertime dose of 70/30 insulin the night he came to the hospital but that normally he does not miss doses of insulin.  Patient went on to tell me that he gets his 70/30 insulin and CBG meter supplies from [employer].  Checks his CBGs at home 3-4 times per day but reported to me that his CBGs are usually 250+ mg/dl on his CBG meter.  Discussed with patient that he really needs to get established with a PCP so that he can get help with managing his DM.  During the last admission, care management [facility], however, for some reason patient never went.  Per patient, he never had an  appointment.  Discussed with patient that I will contact care management again to try to get him set up with the [facility] clinic for after d/c.  Encouraged patient to go to the clinic b/c he can be seen for both healthy and sick visits at a low cost.  Patient appeared mildly interested in going to the [facility] clinic.    Will follow Ambrose FinlandJeannine Johnston Arslan Kier RN, MSN, CDE Diabetes Coordinator Inpatient Diabetes Program Team Pager: [phone] (8a-10p)

## 2014-04-24 NOTE — Plan of Care (Signed)
Problem: Phase I Progression Outcomes Goal: CBGs steadily decreasing on IV insulin drip Outcome: Progressing Goal: Monitor hydration status Outcome: Progressing Goal: Acidosis resolving Outcome: Progressing Goal: NPO or per MD order Outcome: Completed/Met Date Met:  04/24/14 Goal: K+ level approaching normal with therapy Outcome: Progressing Goal: Nausea/vomiting controlled with antiemetics Outcome: Not Applicable Date Met:  93/96/88 Goal: Voiding-avoid urinary catheter unless indicated Outcome: Completed/Met Date Met:  04/24/14 Goal: Pt. states reason for hospitalization Outcome: Completed/Met Date Met:  04/24/14

## 2014-04-24 NOTE — ED Notes (Signed)
Insulin verified by Adin HectorK johnson, rn [phone]

## 2014-04-24 NOTE — Progress Notes (Signed)
CRITICAL VALUE ALERT  Critical value received:  PH 7.18; co2 17.3; po2 117; bicarb 6.3  Date of notification:  04/24/14  Time of notification:  1750  Critical value read back:Yes.    Nurse who received alert:  Buckner Maltaracie Khylah Kendra  MD notified (1st page):  Dr. Benjamine MolaVann  Time of first page:  1752  MD notified (2nd page):  Time of second page:  Responding MD:  Dr. Benjamine MolaVann  Time MD responded:  [phone]

## 2014-04-24 NOTE — ED Provider Notes (Signed)
CSN: [id number]     Arrival date & time 04/24/14  0208 History  This chart was scribed for [name] Mackie, MD by Murriel Hopper, ED Scribe. This patient was seen in room A05C/A05C and the patient's care was started at 2:51 AM.    Chief Complaint  Patient presents with  . Hyperglycemia  . Shortness of Breath      The history is provided by the patient. No language interpreter was used.     HPI Comments: [name] [name] is a 28 y.o. male with DM and a Hx of DKA who presents to the Emergency Department complaining of SOB due to hyperglycemia that has been present for a few hours. Pt states that he has not taken his insulin today because he has not eaten yet. Pt states that he has not eaten because he is not hungry. Pt was seen in ED a few months ago for same symptoms. Pt denies vomiting, diarrhea, nausea, and denies tobacco use or drug use.  Patient reports he was well on Wednesday.  He is a vague historian, and is unsure of when symptoms initially started.  Patient arrives via EMS from home with complaint of shortness of breath.  No fever, chills, no sources of infection.  He reports he has been compliant with his regimen prior now.    Past Medical History  Diagnosis Date  . Diabetes mellitus without complication    History reviewed. No pertinent past surgical history. History reviewed. No pertinent family history. History  Substance Use Topics  . Smoking status: Current Some Day Smoker  . Smokeless tobacco: Not on file  . Alcohol Use: No    Review of Systems  Respiratory: Positive for shortness of breath.   Gastrointestinal: Negative for nausea, vomiting and diarrhea.  All other systems reviewed and are negative.     Allergies  Review of patient's allergies indicates no known allergies.  Home Medications   Prior to Admission medications   Medication Sig Start Date End Date Taking? Authorizing Provider  insulin NPH-regular Human (NOVOLIN 70/30 RELION) (70-30) 100 UNIT/ML  injection 40 units SQ in the morning with breakfast & 35 units in evening with dinner about 5pm 07/10/13   Hollice Espy, MD   BP 159/84 mmHg  Pulse 129  Temp(Src) 98.1 F (36.7 C) (Oral)  Resp 24  Ht 5\' 6"  (1.676 m)  Wt 130 lb (58.968 kg)  BMI 20.99 kg/m2  SpO2 100% Physical Exam  Constitutional: He is oriented to person, place, and time. He appears well-developed and well-nourished. He appears distressed.  Patient is abnormally thin  HENT:  Head: Normocephalic and atraumatic.  Nose: Nose normal.  Mouth/Throat: Oropharynx is clear and moist.  Eyes: Conjunctivae and EOM are normal. Pupils are equal, round, and reactive to light.  Neck: Normal range of motion. Neck supple. No JVD present. No tracheal deviation present. No thyromegaly present.  Cardiovascular: Normal rate, regular rhythm, normal heart sounds and intact distal pulses.  Exam reveals no gallop and no friction rub.   No murmur heard. Pulmonary/Chest: Breath sounds normal. No stridor. He is in respiratory distress. He has no wheezes. He has no rales. He exhibits no tenderness.  Patient has tachypnea.  Clear breath sounds  Abdominal: Soft. Bowel sounds are normal. He exhibits no distension and no mass. There is no tenderness. There is no rebound and no guarding.  Musculoskeletal: Normal range of motion. He exhibits no edema or tenderness.  Lymphadenopathy:    He has no cervical adenopathy.  Neurological: He is alert and oriented to person, place, and time. He displays normal reflexes. He exhibits normal muscle tone. Coordination normal.  Skin: Skin is warm and dry. No rash noted. No erythema. No pallor.  Psychiatric: He has a normal mood and affect. His behavior is normal. Judgment and thought content normal.  Nursing note and vitals reviewed.   ED Course  Procedures (including critical care time)  DIAGNOSTIC STUDIES: Oxygen Saturation is 100% on RA, normal by my interpretation.    COORDINATION OF CARE: 2:56 AM  Discussed treatment plan with pt at bedside and pt agreed to plan.   Labs Review Labs Reviewed  CBC WITH DIFFERENTIAL - Abnormal; Notable for the following:    WBC 18.2 (*)    Neutrophils Relative % 82 (*)    Neutro Abs 15.0 (*)    All other components within normal limits  COMPREHENSIVE METABOLIC PANEL - Abnormal; Notable for the following:    Sodium 131 (*)    Potassium 6.2 (*)    Chloride 92 (*)    CO2 <7 (*)    Glucose, Bld 507 (*)    Total Protein 8.9 (*)    AST 38 (*)    Total Bilirubin 0.2 (*)    All other components within normal limits  CBG MONITORING, ED - Abnormal; Notable for the following:    Glucose-Capillary 486 (*)    All other components within normal limits  CBG MONITORING, ED - Abnormal; Notable for the following:    Glucose-Capillary 458 (*)    All other components within normal limits  CBG MONITORING, ED - Abnormal; Notable for the following:    Glucose-Capillary 385 (*)    All other components within normal limits  BLOOD GAS, ARTERIAL  URINALYSIS, ROUTINE W REFLEX MICROSCOPIC  URINE RAPID DRUG SCREEN (HOSP PERFORMED)  HEMOGLOBIN A1C  TSH  D-DIMER, QUANTITATIVE  CBC WITH DIFFERENTIAL  BASIC METABOLIC PANEL  BASIC METABOLIC PANEL  BASIC METABOLIC PANEL  BASIC METABOLIC PANEL  BASIC METABOLIC PANEL  CBC  I-STAT CG4 LACTIC ACID, ED    Imaging Review Dg Chest Port 1 View  04/24/2014   CLINICAL DATA:  Acute onset of shortness of breath. Hyperglycemia. Initial encounter.  EXAM: PORTABLE CHEST - 1 VIEW  COMPARISON:  Chest radiograph performed 07/07/2013  FINDINGS: The lungs are well-aerated and clear. There is no evidence of focal opacification, pleural effusion or pneumothorax.  The cardiomediastinal silhouette is within normal limits. No acute osseous abnormalities are seen.  IMPRESSION: No acute cardiopulmonary process seen.   Electronically Signed   By: Roanna RaiderJeffery  Chang M.D.   On: 04/24/2014 04:21     EKG Interpretation None        Date:  04/24/2014  Rate: 124  Rhythm: sinus tachycardia  QRS Axis: normal  Intervals: normal  ST/T Wave abnormalities: nonspecific ST changes  Conduction Disutrbances:none  Narrative Interpretation:   Old EKG Reviewed: none available   CRITICAL CARE Performed by: [name] MackieTTER,Bryleigh Ottaway M Total critical care time: 90 min Critical care time was exclusive of separately billable procedures and treating other patients. Critical care was necessary to treat or prevent imminent or life-threatening deterioration. Critical care was time spent personally by me on the following activities: development of treatment plan with patient and/or surrogate as well as nursing, discussions with consultants, evaluation of patient's response to treatment, examination of patient, obtaining history from patient or surrogate, ordering and performing treatments and interventions, ordering and review of laboratory studies, ordering and review of radiographic studies, pulse oximetry  and re-evaluation of patient's condition.  MDM   Final diagnoses:  DKA (diabetic ketoacidoses)   28 year old male with hyperglycemia, tachycardia, tachypnea.  Concern for DKA given his overall appearance.  Patient clinically appears very dehydrated.  We'll start IV hydration.  Plan for labs and probable admission with insulin drip.  I personally performed the services described in this documentation, which was scribed in my presence. The recorded information has been reviewed and is accurate.    [name] Mackielga M Jazzman Loughmiller, MD 04/24/14 [phone]

## 2014-04-24 NOTE — Progress Notes (Signed)
CRITICAL VALUE ALERT  Critical value received:  K 2.8, CO2 9  Date of notification:  04/24/14  Time of notification:  2137  Critical value read back:Yes.    Nurse who received alert:  Kathlene CoteMelissa Maria Gallicchio  MD notified (1st page):  Lenny Pastelom Callahan  Time of first page:  2140  MD notified (2nd page):  Time of second page:  Responding MD:  Lenny Pastelom Callahan  Time MD responded:  2143, new orders received

## 2014-04-24 NOTE — Progress Notes (Signed)
CRITICAL VALUE ALERT  Critical value received:  PH 6.85, CO2 11  Date of notification:  04/24/14  Time of notification:  0643   Critical value read back:Yes.    Nurse who received alert:  Isidor HoltsLee Worley  MD notified (1st page):  Toniann FailKakrakandy  Time of first page:  0645  MD notified (2nd page):  Time of second page:  Responding MD: Toniann FailKakrakandy, new ordered received   Time MD responded:  [phone]

## 2014-04-24 NOTE — Progress Notes (Signed)
CRITICAL VALUE ALERT  Critical value received: CO2 <7   Date of notification:  04/24/14  Time of notification:  0936  Critical value read back:Yes.    Nurse who received alert:  Buckner Maltaracie Dayan Desa  MD notified (1st page):  Dr. Benjamine MolaVann  Time of first page:  [phone]  MD notified (2nd page):  Time of second page:  Responding MD: Dr. Benjamine MolaVann   Time MD responded: 1000

## 2014-04-24 NOTE — ED Notes (Signed)
Pt's CBG is 385.

## 2014-04-24 NOTE — ED Notes (Signed)
Attempted report 

## 2014-04-25 DIAGNOSIS — R000 Tachycardia, unspecified: Secondary | ICD-10-CM

## 2014-04-25 DIAGNOSIS — Z9114 Patient's other noncompliance with medication regimen: Secondary | ICD-10-CM

## 2014-04-25 DIAGNOSIS — F1910 Other psychoactive substance abuse, uncomplicated: Secondary | ICD-10-CM

## 2014-04-25 DIAGNOSIS — E876 Hypokalemia: Secondary | ICD-10-CM

## 2014-04-25 LAB — BASIC METABOLIC PANEL
ANION GAP: 19 — AB (ref 5–15)
ANION GAP: 17 — AB (ref 5–15)
Anion gap: 17 — ABNORMAL HIGH (ref 5–15)
Anion gap: 22 — ABNORMAL HIGH (ref 5–15)
Anion gap: 16 — ABNORMAL HIGH (ref 5–15)
Anion gap: 18 — ABNORMAL HIGH (ref 5–15)
Anion gap: 15 (ref 5–15)
BUN: 7 mg/dL (ref 6–23)
BUN: 6 mg/dL (ref 6–23)
BUN: 6 mg/dL (ref 6–23)
BUN: 5 mg/dL — ABNORMAL LOW (ref 6–23)
BUN: 5 mg/dL — ABNORMAL LOW (ref 6–23)
BUN: 5 mg/dL — ABNORMAL LOW (ref 6–23)
BUN: 4 mg/dL — ABNORMAL LOW (ref 6–23)
CALCIUM: 7.9 mg/dL — AB (ref 8.4–10.5)
CALCIUM: 8.1 mg/dL — AB (ref 8.4–10.5)
CALCIUM: 8 mg/dL — AB (ref 8.4–10.5)
CALCIUM: 8.1 mg/dL — AB (ref 8.4–10.5)
CO2: 12 meq/L — AB (ref 19–32)
CO2: 11 mEq/L — ABNORMAL LOW (ref 19–32)
CO2: 10 meq/L — AB (ref 19–32)
CO2: 13 meq/L — AB (ref 19–32)
CO2: 15 mEq/L — ABNORMAL LOW (ref 19–32)
CO2: 15 mEq/L — ABNORMAL LOW (ref 19–32)
CO2: 16 meq/L — AB (ref 19–32)
CREATININE: 0.61 mg/dL (ref 0.50–1.35)
CREATININE: 0.59 mg/dL (ref 0.50–1.35)
Calcium: 7.7 mg/dL — ABNORMAL LOW (ref 8.4–10.5)
Calcium: 8.3 mg/dL — ABNORMAL LOW (ref 8.4–10.5)
Calcium: 8.3 mg/dL — ABNORMAL LOW (ref 8.4–10.5)
Chloride: 108 mEq/L (ref 96–112)
Chloride: 105 mEq/L (ref 96–112)
Chloride: 105 mEq/L (ref 96–112)
Chloride: 106 mEq/L (ref 96–112)
Chloride: 106 mEq/L (ref 96–112)
Chloride: 102 mEq/L (ref 96–112)
Chloride: 104 mEq/L (ref 96–112)
Creatinine, Ser: 0.59 mg/dL (ref 0.50–1.35)
Creatinine, Ser: 0.57 mg/dL (ref 0.50–1.35)
Creatinine, Ser: 0.59 mg/dL (ref 0.50–1.35)
Creatinine, Ser: 0.6 mg/dL (ref 0.50–1.35)
Creatinine, Ser: 0.57 mg/dL (ref 0.50–1.35)
GFR calc Af Amer: >90 mL/min (ref >90)
GFR calc Af Amer: >90 mL/min (ref >90)
GFR calc Af Amer: >90 mL/min (ref >90)
GFR calc Af Amer: >90 mL/min (ref >90)
GFR calc Af Amer: >90 mL/min (ref >90)
GFR calc non Af Amer: >90 mL/min (ref >90)
GFR calc non Af Amer: >90 mL/min (ref >90)
GFR calc non Af Amer: >90 mL/min (ref >90)
GFR calc non Af Amer: >90 mL/min (ref >90)
GFR calc non Af Amer: >90 mL/min (ref >90)
GLUCOSE: 211 mg/dL — AB (ref 70–99)
GLUCOSE: 172 mg/dL — AB (ref 70–99)
GLUCOSE: 165 mg/dL — AB (ref 70–99)
Glucose, Bld: 126 mg/dL — ABNORMAL HIGH (ref 70–99)
Glucose, Bld: 148 mg/dL — ABNORMAL HIGH (ref 70–99)
Glucose, Bld: 132 mg/dL — ABNORMAL HIGH (ref 70–99)
Glucose, Bld: 183 mg/dL — ABNORMAL HIGH (ref 70–99)
POTASSIUM: 2.8 meq/L — AB (ref 3.7–5.3)
POTASSIUM: 2.6 meq/L — AB (ref 3.7–5.3)
Potassium: 2.8 mEq/L — CL (ref 3.7–5.3)
Potassium: 3.4 mEq/L — ABNORMAL LOW (ref 3.7–5.3)
Potassium: 2.8 mEq/L — CL (ref 3.7–5.3)
Potassium: 2.7 mEq/L — CL (ref 3.7–5.3)
Potassium: 3.3 mEq/L — ABNORMAL LOW (ref 3.7–5.3)
SODIUM: 137 meq/L (ref 137–147)
SODIUM: 135 meq/L — AB (ref 137–147)
SODIUM: 138 meq/L (ref 137–147)
SODIUM: 135 meq/L — AB (ref 137–147)
Sodium: 135 mEq/L — ABNORMAL LOW (ref 137–147)
Sodium: 137 mEq/L (ref 137–147)
Sodium: 135 mEq/L — ABNORMAL LOW (ref 137–147)

## 2014-04-25 LAB — GLUCOSE, CAPILLARY
GLUCOSE-CAPILLARY: 142 mg/dL — AB (ref 70–99)
GLUCOSE-CAPILLARY: 134 mg/dL — AB (ref 70–99)
GLUCOSE-CAPILLARY: 212 mg/dL — AB (ref 70–99)
GLUCOSE-CAPILLARY: 141 mg/dL — AB (ref 70–99)
GLUCOSE-CAPILLARY: 106 mg/dL — AB (ref 70–99)
GLUCOSE-CAPILLARY: 178 mg/dL — AB (ref 70–99)
GLUCOSE-CAPILLARY: 225 mg/dL — AB (ref 70–99)
GLUCOSE-CAPILLARY: 230 mg/dL — AB (ref 70–99)
Glucose-Capillary: 118 mg/dL — ABNORMAL HIGH (ref 70–99)
Glucose-Capillary: 123 mg/dL — ABNORMAL HIGH (ref 70–99)
Glucose-Capillary: 126 mg/dL — ABNORMAL HIGH (ref 70–99)
Glucose-Capillary: 127 mg/dL — ABNORMAL HIGH (ref 70–99)
Glucose-Capillary: 127 mg/dL — ABNORMAL HIGH (ref 70–99)
Glucose-Capillary: 130 mg/dL — ABNORMAL HIGH (ref 70–99)
Glucose-Capillary: 142 mg/dL — ABNORMAL HIGH (ref 70–99)
Glucose-Capillary: 155 mg/dL — ABNORMAL HIGH (ref 70–99)
Glucose-Capillary: 169 mg/dL — ABNORMAL HIGH (ref 70–99)
Glucose-Capillary: 174 mg/dL — ABNORMAL HIGH (ref 70–99)
Glucose-Capillary: 177 mg/dL — ABNORMAL HIGH (ref 70–99)
Glucose-Capillary: 182 mg/dL — ABNORMAL HIGH (ref 70–99)
Glucose-Capillary: 214 mg/dL — ABNORMAL HIGH (ref 70–99)

## 2014-04-25 LAB — CBC
HEMATOCRIT: 34.2 % — AB (ref 39.0–52.0)
HEMOGLOBIN: 12.5 g/dL — AB (ref 13.0–17.0)
MCH: 29.3 pg (ref 26.0–34.0)
MCHC: 36.5 g/dL — AB (ref 30.0–36.0)
MCV: 80.1 fL (ref 78.0–100.0)
Platelets: 177 10*3/uL (ref 150–400)
RBC: 4.27 MIL/uL (ref 4.22–5.81)
RDW: 13 % (ref 11.5–15.5)
WBC: 6.6 10*3/uL (ref 4.0–10.5)

## 2014-04-25 LAB — BLOOD GAS, ARTERIAL
Acid-base deficit: 10.4 mmol/L — ABNORMAL HIGH (ref 0.0–2.0)
Bicarbonate: 14.5 mEq/L — ABNORMAL LOW (ref 20.0–24.0)
DRAWN BY: 257081
FIO2: 0.21 %
O2 SAT: 98.9 %
PATIENT TEMPERATURE: 98.6
PH ART: 7.323 — AB (ref 7.350–7.450)
TCO2: 15.3 mmol/L (ref 0–100)
pCO2 arterial: 28.7 mmHg — ABNORMAL LOW (ref 35.0–45.0)
pO2, Arterial: 111 mmHg — ABNORMAL HIGH (ref 80.0–100.0)

## 2014-04-25 LAB — URINALYSIS, ROUTINE W REFLEX MICROSCOPIC
BILIRUBIN URINE: NEGATIVE
Glucose, UA: 100 mg/dL — AB
Hgb urine dipstick: NEGATIVE
Ketones, ur: 40 mg/dL — AB
Leukocytes, UA: NEGATIVE
Nitrite: NEGATIVE
PROTEIN: NEGATIVE mg/dL
SPECIFIC GRAVITY, URINE: 1.011 (ref 1.005–1.030)
UROBILINOGEN UA: 0.2 mg/dL (ref 0.0–1.0)
pH: 5.5 (ref 5.0–8.0)

## 2014-04-25 LAB — RAPID URINE DRUG SCREEN, HOSP PERFORMED
Amphetamines: NOT DETECTED
Barbiturates: NOT DETECTED
Benzodiazepines: NOT DETECTED
Cocaine: NOT DETECTED
Opiates: NOT DETECTED
Tetrahydrocannabinol: POSITIVE — AB

## 2014-04-25 LAB — MAGNESIUM
MAGNESIUM: 2.3 mg/dL (ref 1.5–2.5)
Magnesium: 1.5 mg/dL (ref 1.5–2.5)
Magnesium: 2.1 mg/dL (ref 1.5–2.5)

## 2014-04-25 MED ORDER — MAGNESIUM SULFATE 50 % IJ SOLN
3.0000 g | Freq: Once | INTRAVENOUS | Status: AC
  Administered 2014-04-25: 3 g via INTRAVENOUS
  Filled 2014-04-25: qty 6

## 2014-04-25 MED ORDER — POTASSIUM CHLORIDE 10 MEQ/100ML IV SOLN
10.0000 meq | INTRAVENOUS | Status: AC
  Administered 2014-04-25 (×3): 10 meq via INTRAVENOUS
  Filled 2014-04-25 (×2): qty 100

## 2014-04-25 MED ORDER — POTASSIUM CHLORIDE 10 MEQ/100ML IV SOLN
10.0000 meq | INTRAVENOUS | Status: AC
  Administered 2014-04-25 (×4): 10 meq via INTRAVENOUS
  Filled 2014-04-25 (×4): qty 100

## 2014-04-25 MED ORDER — SODIUM CHLORIDE 0.9 % IV SOLN
INTRAVENOUS | Status: AC
Start: 2014-04-25 — End: 2014-04-25
  Administered 2014-04-25: 09:00:00 via INTRAVENOUS

## 2014-04-25 MED ORDER — POTASSIUM CHLORIDE 10 MEQ/100ML IV SOLN
10.0000 meq | INTRAVENOUS | Status: AC
  Administered 2014-04-25 (×3): 10 meq via INTRAVENOUS
  Filled 2014-04-25: qty 100

## 2014-04-25 MED ORDER — POTASSIUM CHLORIDE CRYS ER 20 MEQ PO TBCR
60.0000 meq | EXTENDED_RELEASE_TABLET | Freq: Two times a day (BID) | ORAL | Status: DC
  Administered 2014-04-25 – 2014-04-27 (×4): 60 meq via ORAL
  Filled 2014-04-25 (×7): qty 3

## 2014-04-25 MED ORDER — CETYLPYRIDINIUM CHLORIDE 0.05 % MT LIQD
7.0000 mL | Freq: Two times a day (BID) | OROMUCOSAL | Status: DC
  Administered 2014-04-25 – 2014-04-27 (×4): 7 mL via OROMUCOSAL

## 2014-04-25 MED ORDER — POTASSIUM CHLORIDE 10 MEQ/100ML IV SOLN
10.0000 meq | INTRAVENOUS | Status: AC
  Administered 2014-04-26 (×2): 10 meq via INTRAVENOUS
  Filled 2014-04-25: qty 100

## 2014-04-25 MED ORDER — DEXTROSE-NACL 5-0.45 % IV SOLN
INTRAVENOUS | Status: DC
  Administered 2014-04-25 – 2014-04-26 (×3): via INTRAVENOUS

## 2014-04-25 MED ORDER — INSULIN REGULAR HUMAN 100 UNIT/ML IJ SOLN
INTRAMUSCULAR | Status: DC
  Filled 2014-04-25: qty 2.5

## 2014-04-25 MED ORDER — SODIUM CHLORIDE 0.9 % IV SOLN: INTRAVENOUS | Status: DC

## 2014-04-25 MED ORDER — CHLORHEXIDINE GLUCONATE 0.12 % MT SOLN
15.0000 mL | Freq: Two times a day (BID) | OROMUCOSAL | Status: DC
  Administered 2014-04-25 – 2014-04-26 (×3): 15 mL via OROMUCOSAL
  Filled 2014-04-25 (×5): qty 15

## 2014-04-25 NOTE — Progress Notes (Signed)
[place] TEAM 1 - Stepdown/ICU TEAM Progress Note  [name] [name] YNW:[id number] DOB: 03/23/1986 DOA: 04/24/2014 PCP: Default, Provider, MD  Admit HPI / Brief Narrative: [name] MastAnthony Ladley 28 y.o. BM PMHx diabetes mellitus type 2 uncontrolled, noncompliance with medication, substance abuse (marijuana) . Presented to the ER because of shortness of breath. Patient states that he became short of breath last night suddenly. Patient denies any chest pain or productive cough fever chills. Patient states he did not take his insulin yesterday because he was not feeling well. In the ER patient was found to have elevated anion gap with elevated blood sugar and patient was found to be having a pH of 6.9 in the VBG. Critical care was consulted by the ER physician and at this time patient is being admitted for severe DKA. Patient states that he has not missed his medications except for yesterday. Denies any nausea vomiting abdominal pain diarrhea fever chills.    HPI/Subjective: 12/5 state he moved here from [place]   3 months ago. State was seen at a clinic where he was just given prescriptions for medications never evaluated, by PCP. Patient states has been a diabetic for 3 years and was initially on oral anti-glycemic.  Assessment/Plan: Diabetes type 2 uncontrolled -Continue glucose stabilizer protocol -12/4 hemoglobin A1c= 11.6 -Patient's HC03 continues to be low; sodium bicarbonate sterile water 100 ml/hr for 1 L  Hypokalemia -Continue to replace potassium aggressively -Monitor and replace magnesium aggressively  Substance abuse -Patient positive for marijuana consult on abstinence  Noncompliance with medication -Patient has been noncompliant with medication, does not have a PCP to help manage his diabetes. -Consult to social work to help with obtaining PCP. Addendum review of diabetic coordinator's notes shows that in February 2015 they had attempted to Patient tied into the system  i.e. new PCP, counseled on medication count, counseled on sequela of poorly controlled diabetes. Patient did not follow through.      Code Status: FULL Family Communication: no family present at time of exam Disposition Plan: Resolution DKA    Consultants: NA  Procedure/Significant Events: NA   Culture 12/4 MRSA by PCR negative 12/5 blood  2 pending 12/5 urine pending   Antibiotics: NA  DVT prophylaxis: Lovenox   Devices    LINES / TUBES:      Continuous Infusions: . sodium chloride Stopped (04/24/14 1030)  . dextrose 5 % and 0.45% NaCl 125 mL/hr at 04/24/14 1030  . insulin (NOVOLIN-R) infusion 1.2 Units/hr (04/25/14 [id number])    Objective: VITAL SIGNS: Temp: 98.3 F (36.8 C) (12/05 0813) Temp Source: Oral (12/05 0813) BP: 125/81 mmHg (12/05 0813) Pulse Rate: 103 (12/05 0813) SPO2; FIO2:   Intake/Output Summary (Last 24 hours) at 04/25/14 0826 Last data filed at 04/25/14 0700  Gross per 24 hour  Intake 3487.5 ml  Output   5100 ml  Net -1612.5 ml     Exam: General: A/O 4, NAD, No acute respiratory distress Lungs: Clear to auscultation bilaterally without wheezes or crackles Cardiovascular: Regular rate and rhythm without murmur gallop or rub normal S1 and S2 Abdomen: Nontender, nondistended, soft, bowel sounds positive, no rebound, no ascites, no appreciable mass Extremities: No significant cyanosis, clubbing, or edema bilateral lower extremities  Data Reviewed: Basic Metabolic Panel:  Recent Labs Lab 04/24/14 1503 04/24/14 2040 04/25/14 0137 04/25/14 0555 04/25/14 0650  NA 142 140 137 135* 137  K 4.3 2.8* 2.8* 3.4* 2.8*  CL 113* 113* 108 105 105  CO2 <7* 9* 12* 11* 10*  GLUCOSE 256* 193* 126* 148* 211*  BUN 9 8 7 6 6   CREATININE 0.72 0.64 1.610.61 0.59 0.59  CALCIUM 7.8* 7.6* 7.9* 8.1* 8.0*   Liver Function Tests:  Recent Labs Lab 04/24/14 0250  AST 38*  ALT 12  ALKPHOS 78  BILITOT 0.2*  PROT 8.9*  ALBUMIN 4.6   No  results for input(s): LIPASE, AMYLASE in the last 168 hours. No results for input(s): AMMONIA in the last 168 hours. CBC:  Recent Labs Lab 04/24/14 0250 04/24/14 0840  WBC 18.2* 19.6*  NEUTROABS 15.0* 16.4*  HGB 15.4 13.7  HCT 44.6 40.5  MCV 86.8 86.7  PLT 349 274   Cardiac Enzymes: No results for input(s): CKTOTAL, CKMB, CKMBINDEX, TROPONINI in the last 168 hours. BNP (last 3 results) No results for input(s): PROBNP in the last 8760 hours. CBG:  Recent Labs Lab 04/25/14 0231 04/25/14 0335 04/25/14 0443 04/25/14 0531 04/25/14 0631  GLUCAP 142* 130* 118* 134* 182*    Recent Results (from the past 240 hour(s))  MRSA PCR Screening     Status: None   Collection Time: 04/24/14  8:19 AM  Result Value Ref Range Status   MRSA by PCR NEGATIVE NEGATIVE Final    Comment:        The GeneXpert MRSA Assay (FDA approved for NASAL specimens only), is one component of a comprehensive MRSA colonization surveillance program. It is not intended to diagnose MRSA infection nor to guide or monitor treatment for MRSA infections.      Studies:  Recent x-ray studies have been reviewed in detail by the Attending Physician  Scheduled Meds:  Scheduled Meds: . antiseptic oral rinse  7 mL Mouth Rinse q12n4p  . chlorhexidine  15 mL Mouth Rinse BID  . enoxaparin (LOVENOX) injection  40 mg Subcutaneous Q24H  . pantoprazole (PROTONIX) IV  40 mg Intravenous Q24H    Time spent on care of this patient: 40 mins   Drema DallasWOODS, Apolonio Cutting, J , MD   [facility] Office  [phone] Pager - [phone]  On-Call/Text Page:      [email or web address] [email or web address]      password [email or web address]  If 7PM-7AM, please contact night-coverage [email or web address] Password [email or web address] 04/25/2014, 8:26 AM   LOS: 1 day

## 2014-04-25 NOTE — Progress Notes (Signed)
CRITICAL VALUE ALERT  Critical value received:  K 2.8   Date of notification:  04/25/14  Time of notification:  1320  Critical value read back:Yes.    Nurse who received alert:  Buckner Maltaracie Breionna Punt  MD notified (1st page):  Dr. Joseph ArtWoods  Time of first page:  1330  MD notified (2nd page):  Time of second page:  Responding MD:  Dr. Joseph ArtWoods   Time MD responded:  [phone]

## 2014-04-25 NOTE — Progress Notes (Signed)
CRITICAL VALUE ALERT  Critical value received:  K 2.8 and CO2: 10  Date of notification:  04/25/14  Time of notification:  0816  Critical value read back:Yes.    Nurse who received alert:  Buckner Maltaracie Darrian Grzelak  MD notified (1st page):  Dr. Joseph ArtWoods  Time of first page:  [phone]  MD notified (2nd page):  Time of second page:  Responding MD:  Dr. Joseph ArtWoods  Time MD responded:  [phone]

## 2014-04-25 NOTE — Progress Notes (Signed)
CRITICAL VALUE ALERT  Critical value received:  K 2.16  Date of notification:  04/25/14  Time of notification:  0216  Critical value read back:Yes.    Nurse who received alert:  Kathlene CoteMelissa Xaiden Fleig  MD notified (1st page):  Lenny Pastelom Callahan  Time of first page:  0219  MD notified (2nd page):  Time of second page:  Responding MD:  Lenny Pastelom Callahan  Time MD responded:  0222, new orders received  Rochele PagesHancock, Lucca Ballo A, RN

## 2014-04-25 NOTE — Progress Notes (Signed)
CRITICAL VALUE ALERT  Critical value received:  K 2.6  Date of notification:  04/25/14  Time of notification:  1812  Critical value read back:Yes.    Nurse who received alert:  Buckner Maltaracie Sindia Kowalczyk  MD notified (1st page):  Dr. Joseph ArtWoods  Time of first page:  1817  MD notified (2nd page):  Time of second page:  Responding MD:  Dr. Joseph ArtWoods  Time MD responded:  [phone]

## 2014-04-25 NOTE — Progress Notes (Addendum)
CRITICAL VALUE ALERT  Critical value received:  K 2.7  Date of notification:  04/25/14  Time of notification:  1130  Critical value read back:Yes.    Nurse who received alert:  Buckner Maltaracie Shields Pautz   MD notified (1st page):  Dr. Joseph ArtWoods  Time of first page:  1120  MD notified (2nd page):  Time of second page:  Responding MD: Dr. Joseph ArtWoods, no new orders. Keep insulin drip going per MD.   Time MD responded:  1130

## 2014-04-26 DIAGNOSIS — Z91148 Patient's other noncompliance with medication regimen for other reason: Secondary | ICD-10-CM | POA: Diagnosis present

## 2014-04-26 DIAGNOSIS — E1010 Type 1 diabetes mellitus with ketoacidosis without coma: Secondary | ICD-10-CM | POA: Diagnosis present

## 2014-04-26 DIAGNOSIS — E876 Hypokalemia: Secondary | ICD-10-CM | POA: Diagnosis present

## 2014-04-26 DIAGNOSIS — R000 Tachycardia, unspecified: Secondary | ICD-10-CM | POA: Diagnosis present

## 2014-04-26 DIAGNOSIS — Z9114 Patient's other noncompliance with medication regimen: Secondary | ICD-10-CM | POA: Diagnosis present

## 2014-04-26 DIAGNOSIS — F1910 Other psychoactive substance abuse, uncomplicated: Secondary | ICD-10-CM | POA: Diagnosis present

## 2014-04-26 DIAGNOSIS — E785 Hyperlipidemia, unspecified: Secondary | ICD-10-CM

## 2014-04-26 LAB — GLUCOSE, CAPILLARY
GLUCOSE-CAPILLARY: 102 mg/dL — AB (ref 70–99)
GLUCOSE-CAPILLARY: 112 mg/dL — AB (ref 70–99)
GLUCOSE-CAPILLARY: 124 mg/dL — AB (ref 70–99)
GLUCOSE-CAPILLARY: 145 mg/dL — AB (ref 70–99)
GLUCOSE-CAPILLARY: 152 mg/dL — AB (ref 70–99)
GLUCOSE-CAPILLARY: 180 mg/dL — AB (ref 70–99)
GLUCOSE-CAPILLARY: 205 mg/dL — AB (ref 70–99)
GLUCOSE-CAPILLARY: 227 mg/dL — AB (ref 70–99)
GLUCOSE-CAPILLARY: 243 mg/dL — AB (ref 70–99)
GLUCOSE-CAPILLARY: 97 mg/dL (ref 70–99)
Glucose-Capillary: 135 mg/dL — ABNORMAL HIGH (ref 70–99)
Glucose-Capillary: 219 mg/dL — ABNORMAL HIGH (ref 70–99)
Glucose-Capillary: 220 mg/dL — ABNORMAL HIGH (ref 70–99)
Glucose-Capillary: 156 mg/dL — ABNORMAL HIGH (ref 70–99)
Glucose-Capillary: 117 mg/dL — ABNORMAL HIGH (ref 70–99)
Glucose-Capillary: 136 mg/dL — ABNORMAL HIGH (ref 70–99)
Glucose-Capillary: 192 mg/dL — ABNORMAL HIGH (ref 70–99)
Glucose-Capillary: 159 mg/dL — ABNORMAL HIGH (ref 70–99)
Glucose-Capillary: 148 mg/dL — ABNORMAL HIGH (ref 70–99)
Glucose-Capillary: 200 mg/dL — ABNORMAL HIGH (ref 70–99)
Glucose-Capillary: 92 mg/dL (ref 70–99)
Glucose-Capillary: 138 mg/dL — ABNORMAL HIGH (ref 70–99)

## 2014-04-26 LAB — BASIC METABOLIC PANEL
ANION GAP: 15 (ref 5–15)
ANION GAP: 10 (ref 5–15)
ANION GAP: 10 (ref 5–15)
Anion gap: 13 (ref 5–15)
Anion gap: 15 (ref 5–15)
Anion gap: 13 (ref 5–15)
BUN: 4 mg/dL — ABNORMAL LOW (ref 6–23)
BUN: 4 mg/dL — AB (ref 6–23)
BUN: 4 mg/dL — ABNORMAL LOW (ref 6–23)
BUN: 3 mg/dL — ABNORMAL LOW (ref 6–23)
BUN: 3 mg/dL — ABNORMAL LOW (ref 6–23)
BUN: 3 mg/dL — ABNORMAL LOW (ref 6–23)
CALCIUM: 8.2 mg/dL — AB (ref 8.4–10.5)
CALCIUM: 8.2 mg/dL — AB (ref 8.4–10.5)
CALCIUM: 8.2 mg/dL — AB (ref 8.4–10.5)
CALCIUM: 8.5 mg/dL (ref 8.4–10.5)
CHLORIDE: 100 meq/L (ref 96–112)
CO2: 17 meq/L — AB (ref 19–32)
CO2: 20 meq/L (ref 19–32)
CO2: 21 meq/L (ref 19–32)
CO2: 23 mEq/L (ref 19–32)
CO2: 26 meq/L (ref 19–32)
CO2: 27 mEq/L (ref 19–32)
CREATININE: 0.58 mg/dL (ref 0.50–1.35)
CREATININE: 0.54 mg/dL (ref 0.50–1.35)
Calcium: 8.5 mg/dL (ref 8.4–10.5)
Calcium: 8.3 mg/dL — ABNORMAL LOW (ref 8.4–10.5)
Chloride: 105 mEq/L (ref 96–112)
Chloride: 105 mEq/L (ref 96–112)
Chloride: 101 mEq/L (ref 96–112)
Chloride: 100 mEq/L (ref 96–112)
Chloride: 102 mEq/L (ref 96–112)
Creatinine, Ser: 0.57 mg/dL (ref 0.50–1.35)
Creatinine, Ser: 0.47 mg/dL — ABNORMAL LOW (ref 0.50–1.35)
Creatinine, Ser: 0.48 mg/dL — ABNORMAL LOW (ref 0.50–1.35)
Creatinine, Ser: 0.47 mg/dL — ABNORMAL LOW (ref 0.50–1.35)
GFR calc Af Amer: >90 mL/min (ref >90)
GFR calc Af Amer: >90 mL/min (ref >90)
GFR calc Af Amer: >90 mL/min (ref >90)
GFR calc Af Amer: >90 mL/min (ref >90)
GFR calc Af Amer: >90 mL/min (ref >90)
GFR calc Af Amer: >90 mL/min (ref >90)
GFR calc non Af Amer: >90 mL/min (ref >90)
GFR calc non Af Amer: >90 mL/min (ref >90)
GFR calc non Af Amer: >90 mL/min (ref >90)
GFR calc non Af Amer: >90 mL/min (ref >90)
GFR calc non Af Amer: >90 mL/min (ref >90)
GLUCOSE: 127 mg/dL — AB (ref 70–99)
GLUCOSE: 127 mg/dL — AB (ref 70–99)
GLUCOSE: 199 mg/dL — AB (ref 70–99)
Glucose, Bld: 177 mg/dL — ABNORMAL HIGH (ref 70–99)
Glucose, Bld: 206 mg/dL — ABNORMAL HIGH (ref 70–99)
Glucose, Bld: 93 mg/dL (ref 70–99)
POTASSIUM: 3.5 meq/L — AB (ref 3.7–5.3)
Potassium: 4.5 mEq/L (ref 3.7–5.3)
Potassium: 2.9 mEq/L — CL (ref 3.7–5.3)
Potassium: 3.6 mEq/L — ABNORMAL LOW (ref 3.7–5.3)
Potassium: 3.3 mEq/L — ABNORMAL LOW (ref 3.7–5.3)
Potassium: 3.4 mEq/L — ABNORMAL LOW (ref 3.7–5.3)
SODIUM: 136 meq/L — AB (ref 137–147)
SODIUM: 137 meq/L (ref 137–147)
SODIUM: 136 meq/L — AB (ref 137–147)
SODIUM: 139 meq/L (ref 137–147)
Sodium: 135 mEq/L — ABNORMAL LOW (ref 137–147)
Sodium: 140 mEq/L (ref 137–147)

## 2014-04-26 LAB — LIPID PANEL
CHOLESTEROL: 196 mg/dL (ref 0–200)
HDL: 56 mg/dL (ref >39)
LDL Cholesterol: 121 mg/dL — ABNORMAL HIGH (ref 0–99)
Total CHOL/HDL Ratio: 3.5 RATIO
Triglycerides: 93 mg/dL (ref <150)
VLDL: 19 mg/dL (ref 0–40)

## 2014-04-26 LAB — MAGNESIUM
MAGNESIUM: 1.9 mg/dL (ref 1.5–2.5)
MAGNESIUM: 1.7 mg/dL (ref 1.5–2.5)
Magnesium: 2 mg/dL (ref 1.5–2.5)
Magnesium: 2 mg/dL (ref 1.5–2.5)
Magnesium: 1.8 mg/dL (ref 1.5–2.5)
Magnesium: 1.9 mg/dL (ref 1.5–2.5)

## 2014-04-26 MED ORDER — STERILE WATER FOR INJECTION IV SOLN
Freq: Once | INTRAVENOUS | Status: AC
  Administered 2014-04-26: 09:00:00 via INTRAVENOUS
  Filled 2014-04-26: qty 850

## 2014-04-26 MED ORDER — POTASSIUM CHLORIDE CRYS ER 20 MEQ PO TBCR
60.0000 meq | EXTENDED_RELEASE_TABLET | Freq: Once | ORAL | Status: AC
  Administered 2014-04-26: 60 meq via ORAL
  Filled 2014-04-26: qty 3

## 2014-04-26 MED ORDER — ATORVASTATIN CALCIUM 20 MG PO TABS
20.0000 mg | ORAL_TABLET | Freq: Every day | ORAL | Status: DC
  Administered 2014-04-26 – 2014-04-28 (×3): 20 mg via ORAL
  Filled 2014-04-26 (×3): qty 1

## 2014-04-26 NOTE — Progress Notes (Signed)
[place] TEAM 1 - Stepdown/ICU TEAM Progress Note  [name] Mastnthony [name] ZOX:[id number] DOB: August 26, 1985 DOA: 04/24/2014 PCP: Default, Provider, MD  Admit HPI / Brief Narrative: [name] MastAnthony [name] 28 y.o. BM PMHx diabetes mellitus type 2 uncontrolled, noncompliance with medication, substance abuse (marijuana) . Presented to the ER because of shortness of breath. Patient states that he became short of breath last night suddenly. Patient denies any chest pain or productive cough fever chills. Patient states he did not take his insulin yesterday because he was not feeling well. In the ER patient was found to have elevated anion gap with elevated blood sugar and patient was found to be having a pH of 6.9 in the VBG. Critical care was consulted by the ER physician and at this time patient is being admitted for severe DKA. Patient states that he has not missed his medications except for yesterday. Denies any nausea vomiting abdominal pain diarrhea fever chills.    HPI/Subjective: 12/5 state he moved here from [place]  3 months ago. State was seen at a clinic where he was just given prescriptions for medications never evaluated, by PCP. Patient states has been a diabetic for 3 years and was initially on oral anti-glycemic.  Assessment/Plan: Diabetes type 2 uncontrolled -Pt was admitted with Dx of DM Type 1, however Pt states was on PO anti-glycemic. Anti-GAD pending, Insulin Ab pending Continue glucose stabilizer protocol -12/4 hemoglobin A1c= 11.6 -Patient's HC03 continues to be low; sodium bicarbonate sterile water 100 ml/hr for 1 L - 12/6 AG=15  HLD - Not  Within ADA Guidelines start Lipitor 20 mg Daily  Hypokalemia -Continue to replace potassium aggressively -Monitor and replace magnesium aggressively  Substance abuse -Patient positive for marijuana counseled on abstinence  Noncompliance with medication -Patient has been noncompliant with medication, does not have a PCP to help  manage his diabetes. -Consult to social work to help with obtaining PCP. Addendum review of diabetic coordinator's notes shows that in February 2015 they had attempted to get  Patient tied into the system i.e. new PCP, counseled on medication,counseled on sequela of poorly controlled diabetes. Patient did not follow through.      Code Status: FULL Family Communication: no family present at time of exam Disposition Plan: Resolution DKA    Consultants: NA  Procedure/Significant Events: NA   Culture 12/4 MRSA by PCR negative 12/5 blood  Lt Arm NGTD 12/5 Blood pending  12/5 urine pending   Antibiotics: NA  DVT prophylaxis: Lovenox   Devices    LINES / TUBES:      Continuous Infusions: . sodium chloride Stopped (04/24/14 1030)  . sodium chloride    . dextrose 5 % and 0.45% NaCl 75 mL/hr at 04/26/14 1200  . insulin (NOVOLIN-R) infusion 3.7 Units/hr (04/26/14 1433)    Objective: VITAL SIGNS: Temp: 98 F (36.7 C) (12/06 1223) Temp Source: Oral (12/06 1223) BP: 142/95 mmHg (12/06 1223) Pulse Rate: 95 (12/06 1223) SPO2; FIO2:   Intake/Output Summary (Last 24 hours) at 04/26/14 1535 Last data filed at 04/26/14 1400  Gross per 24 hour  Intake 1423.75 ml  Output   2675 ml  Net -1251.25 ml     Exam: General: A/O 4, NAD, No acute respiratory distress Lungs: Clear to auscultation bilaterally without wheezes or crackles Cardiovascular: Regular rate and rhythm without murmur gallop or rub normal S1 and S2 Abdomen: Nontender, nondistended, soft, bowel sounds positive, no rebound, no ascites, no appreciable mass Extremities: No significant cyanosis, clubbing, or edema bilateral lower extremities  Data Reviewed: Basic Metabolic Panel:  Recent Labs Lab 04/25/14 1652 04/25/14 1925 04/25/14 2209 04/26/14 0203 04/26/14 0830 04/26/14 1208  NA 135*  --  135* 135* 140 136*  K 2.6*  --  3.3* 4.5 2.9* 3.5*  CL 102  --  104 105 105 100  CO2 15*  --  16* 17*  20 21  GLUCOSE 183*  --  165* 127* 127* 177*  BUN 5*  --  4* 4* 4* 4*  CREATININE 0.60  --  0.57 0.58 0.54 0.57  CALCIUM 8.3*  --  8.1* 8.2* 8.5 8.2*  MG  --  2.3 2.1 2.0 2.0 1.9   Liver Function Tests:  Recent Labs Lab 04/24/14 0250  AST 38*  ALT 12  ALKPHOS 78  BILITOT 0.2*  PROT 8.9*  ALBUMIN 4.6   No results for input(s): LIPASE, AMYLASE in the last 168 hours. No results for input(s): AMMONIA in the last 168 hours. CBC:  Recent Labs Lab 04/24/14 0250 04/24/14 0840 04/25/14 1149  WBC 18.2* 19.6* 6.6  NEUTROABS 15.0* 16.4*  --   HGB 15.4 13.7 12.5*  HCT 44.6 40.5 34.2*  MCV 86.8 86.7 80.1  PLT 349 274 177   Cardiac Enzymes: No results for input(s): CKTOTAL, CKMB, CKMBINDEX, TROPONINI in the last 168 hours. BNP (last 3 results) No results for input(s): PROBNP in the last 8760 hours. CBG:  Recent Labs Lab 04/26/14 0938 04/26/14 1044 04/26/14 1149 04/26/14 1300 04/26/14 1431  GLUCAP 112* 117* 136* 192* 243*    Recent Results (from the past 240 hour(s))  MRSA PCR Screening     Status: None   Collection Time: 04/24/14  8:19 AM  Result Value Ref Range Status   MRSA by PCR NEGATIVE NEGATIVE Final    Comment:        The GeneXpert MRSA Assay (FDA approved for NASAL specimens only), is one component of a comprehensive MRSA colonization surveillance program. It is not intended to diagnose MRSA infection nor to guide or monitor treatment for MRSA infections.   Culture, blood (routine x 2)     Status: None (Preliminary result)   Collection Time: 04/25/14 11:49 AM  Result Value Ref Range Status   Specimen Description BLOOD LEFT ARM  Final   Special Requests BOTTLES DRAWN AEROBIC AND ANAEROBIC 5CC  Final   Culture  Setup Time   Final    04/25/2014 19:02 Performed at [facility]    Culture   Final           BLOOD CULTURE RECEIVED NO GROWTH TO DATE CULTURE WILL BE HELD FOR 5 DAYS BEFORE ISSUING A FINAL NEGATIVE REPORT Performed at [facility]    Report Status PENDING  Incomplete     Studies:  Recent x-ray studies have been reviewed in detail by the Attending Physician  Scheduled Meds:  Scheduled Meds: . antiseptic oral rinse  7 mL Mouth Rinse q12n4p  . chlorhexidine  15 mL Mouth Rinse BID  . enoxaparin (LOVENOX) injection  40 mg Subcutaneous Q24H  . pantoprazole (PROTONIX) IV  40 mg Intravenous Q24H  . potassium chloride  60 mEq Oral BID    Time spent on care of this patient: 40 mins   Drema DallasWOODS, Rohaan Durnil, J , MD   [facility] Office  [phone] Pager - [phone]  On-Call/Text Page:      [email or web address] [email or web address]      password [email or web address]  If 7PM-7AM, please contact night-coverage [email or web address] Password [email or web address] 04/26/2014, 3:35 PM  LOS: 2 days

## 2014-04-27 LAB — URINE CULTURE
Colony Count: NO GROWTH
Culture: NO GROWTH

## 2014-04-27 LAB — BASIC METABOLIC PANEL
ANION GAP: 6 (ref 5–15)
BUN: 5 mg/dL — AB (ref 6–23)
CHLORIDE: 100 meq/L (ref 96–112)
CO2: 31 meq/L (ref 19–32)
CREATININE: 0.72 mg/dL (ref 0.50–1.35)
Calcium: 8.8 mg/dL (ref 8.4–10.5)
GFR calc Af Amer: >90 mL/min (ref >90)
GFR calc non Af Amer: >90 mL/min (ref >90)
GLUCOSE: 246 mg/dL — AB (ref 70–99)
Potassium: 4.1 mEq/L (ref 3.7–5.3)
Sodium: 137 mEq/L (ref 137–147)

## 2014-04-27 LAB — GLUCOSE, CAPILLARY
GLUCOSE-CAPILLARY: 119 mg/dL — AB (ref 70–99)
GLUCOSE-CAPILLARY: 160 mg/dL — AB (ref 70–99)
GLUCOSE-CAPILLARY: 173 mg/dL — AB (ref 70–99)
GLUCOSE-CAPILLARY: 229 mg/dL — AB (ref 70–99)
GLUCOSE-CAPILLARY: 57 mg/dL — AB (ref 70–99)
Glucose-Capillary: 77 mg/dL (ref 70–99)
Glucose-Capillary: 186 mg/dL — ABNORMAL HIGH (ref 70–99)
Glucose-Capillary: 176 mg/dL — ABNORMAL HIGH (ref 70–99)
Glucose-Capillary: 250 mg/dL — ABNORMAL HIGH (ref 70–99)
Glucose-Capillary: 74 mg/dL (ref 70–99)

## 2014-04-27 LAB — MAGNESIUM
Magnesium: 1.8 mg/dL (ref 1.5–2.5)
Magnesium: 2.1 mg/dL (ref 1.5–2.5)
Magnesium: 1.9 mg/dL (ref 1.5–2.5)

## 2014-04-27 MED ORDER — LISINOPRIL 5 MG PO TABS
5.0000 mg | ORAL_TABLET | Freq: Every day | ORAL | Status: DC
  Administered 2014-04-28: 5 mg via ORAL
  Filled 2014-04-27: qty 1

## 2014-04-27 MED ORDER — SODIUM CHLORIDE 0.9 % IV SOLN
INTRAVENOUS | Status: DC
  Administered 2014-04-27 – 2014-04-28 (×2): via INTRAVENOUS

## 2014-04-27 MED ORDER — INSULIN ASPART PROT & ASPART (70-30 MIX) 100 UNIT/ML ~~LOC~~ SUSP: 30.0000 [IU] | Freq: Every day | SUBCUTANEOUS | Status: DC

## 2014-04-27 MED ORDER — POTASSIUM CHLORIDE CRYS ER 20 MEQ PO TBCR
40.0000 meq | EXTENDED_RELEASE_TABLET | Freq: Two times a day (BID) | ORAL | Status: DC
  Administered 2014-04-28: 40 meq via ORAL
  Filled 2014-04-27 (×2): qty 2

## 2014-04-27 MED ORDER — INSULIN ASPART PROT & ASPART (70-30 MIX) 100 UNIT/ML ~~LOC~~ SUSP
25.0000 [IU] | Freq: Every day | SUBCUTANEOUS | Status: DC
  Filled 2014-04-27: qty 10

## 2014-04-27 MED ORDER — INSULIN ASPART PROT & ASPART (70-30 MIX) 100 UNIT/ML ~~LOC~~ SUSP: 40.0000 [IU] | Freq: Every day | SUBCUTANEOUS | Status: DC

## 2014-04-27 MED ORDER — HYDROCHLOROTHIAZIDE 12.5 MG PO CAPS
12.5000 mg | ORAL_CAPSULE | Freq: Every day | ORAL | Status: DC
  Administered 2014-04-28: 12.5 mg via ORAL
  Filled 2014-04-27: qty 1

## 2014-04-27 MED ORDER — INSULIN ASPART PROT & ASPART (70-30 MIX) 100 UNIT/ML ~~LOC~~ SUSP
35.0000 [IU] | Freq: Every day | SUBCUTANEOUS | Status: DC
  Administered 2014-04-27: 35 [IU] via SUBCUTANEOUS
  Filled 2014-04-27: qty 10

## 2014-04-27 MED ORDER — INSULIN ASPART PROT & ASPART (70-30 MIX) 100 UNIT/ML ~~LOC~~ SUSP
35.0000 [IU] | Freq: Every day | SUBCUTANEOUS | Status: DC
  Administered 2014-04-28: 35 [IU] via SUBCUTANEOUS

## 2014-04-27 MED ORDER — INSULIN GLARGINE 100 UNIT/ML ~~LOC~~ SOLN
8.0000 [IU] | Freq: Once | SUBCUTANEOUS | Status: AC
  Administered 2014-04-27: 8 [IU] via SUBCUTANEOUS
  Filled 2014-04-27: qty 0.08

## 2014-04-27 MED ORDER — INSULIN ASPART 100 UNIT/ML ~~LOC~~ SOLN: 0.0000 [IU] | Freq: Every day | SUBCUTANEOUS | Status: DC

## 2014-04-27 MED ORDER — POTASSIUM CHLORIDE CRYS ER 20 MEQ PO TBCR: 40.0000 meq | EXTENDED_RELEASE_TABLET | Freq: Two times a day (BID) | ORAL | Status: DC

## 2014-04-27 MED ORDER — PANTOPRAZOLE SODIUM 40 MG PO TBEC
40.0000 mg | DELAYED_RELEASE_TABLET | Freq: Every day | ORAL | Status: DC
  Administered 2014-04-27: 40 mg via ORAL
  Filled 2014-04-27: qty 1

## 2014-04-27 MED ORDER — INSULIN ASPART 100 UNIT/ML ~~LOC~~ SOLN
0.0000 [IU] | Freq: Three times a day (TID) | SUBCUTANEOUS | Status: DC
  Administered 2014-04-27: 5 [IU] via SUBCUTANEOUS
  Administered 2014-04-27 (×2): 3 [IU] via SUBCUTANEOUS
  Administered 2014-04-28: 8 [IU] via SUBCUTANEOUS
  Administered 2014-04-28: 2 [IU] via SUBCUTANEOUS

## 2014-04-27 MED ORDER — INSULIN ASPART PROT & ASPART (70-30 MIX) 100 UNIT/ML ~~LOC~~ SUSP
25.0000 [IU] | Freq: Every day | SUBCUTANEOUS | Status: DC
  Administered 2014-04-27 – 2014-04-28 (×2): 25 [IU] via SUBCUTANEOUS

## 2014-04-27 NOTE — Progress Notes (Signed)
CBG this am 57. 70/30 Insulin held. Dr. Sharon SellerMcClung notified. New orders received and insulin adjusted.

## 2014-04-27 NOTE — Progress Notes (Signed)
[place] TEAM 1 - Stepdown/ICU TEAM Progress Note  [name] [name] ZOX:[id number] DOB: July 14, 1985 DOA: 04/24/2014 PCP: Default, Provider, MD  Admit HPI / Brief Narrative: 28 yo M w/ Hx diabetes mellitus type 2 uncontrolled, noncompliance with medication, and substance abuse (marijuana) who presented to the ER because of sudden onset of shortness of breath.  Patient denied chest pain productive cough fever or chills. Patient stated he did not take his insulin because he was not feeling well.   In the ER patient was found to have an elevated anion gap with elevated blood sugar and a pH of 6.9.   HPI/Subjective: Pt has no complaints today.  He denies sob, cough, cp, n/v, or abdom pain.    Assessment/Plan:  Diabetes type 1 uncontrolled w/ DKA  -counseled pt on absolute need to comply w/ his DM regimen - pt is now off the insulin gtt, but his CBGs have been quite variable, w/ some hypoglycemia - no BMET today - recheck BMET to assess bicarb and lytes - dose w/ long acting insulin to prevent from relapsing into DKA - attempt to establish requirement for home insulin dosing   HLD -not within ADA Guidelines - Dr. Joseph Art started Lipitor 20 mg daily  High Blood Pressure -initiate and ACE and low dose HCTZ in this diabetic and follow  Hypokalemia -Continue to replace potassium aggressively to goal of 4.0 -Monitor and replace magnesium aggressively to goal of 2.0  Substance abuse -Patient positive for marijuana - counseled on abstinence  Noncompliance with medication -Patient has been noncompliant with medication - does not have a PCP to help manage his diabetes. -Consult to social work to help with obtaining PCP -in February 2015 staff at [facility] attempted to get this patient tied into the system (i.e. new PCP, counseled on medication, counseled on sequela of poorly controlled diabetes) but patient did not follow through  Code Status: FULL Family Communication: no family present at time of  exam Disposition Plan: SDU - probable transfer or even d/c home 12/8  Consultants: NA  Procedures: NA  Antibiotics: NA  DVT prophylaxis: Lovenox  Objective: Blood pressure 137/95, pulse 111, temperature 97.5 F (36.4 C), temperature source Oral, resp. rate 13, height 5\' 6"  (1.676 m), weight 58.968 kg (130 lb), SpO2 100 %.  Intake/Output Summary (Last 24 hours) at 04/27/14 1500 Last data filed at 04/27/14 1400  Gross per 24 hour  Intake 2458.75 ml  Output   1850 ml  Net 608.75 ml   Exam: General:  No acute respiratory distress Lungs: Clear to auscultation bilaterally without wheezes or crackles Cardiovascular: Regular rate and rhythm without murmur gallop or rub normal S1 and S2 Abdomen: Nontender, nondistended, soft, bowel sounds positive, no rebound, no ascites, no appreciable mass Extremities: No significant cyanosis, clubbing, or edema bilateral lower extremities  Data Reviewed: Basic Metabolic Panel:  Recent Labs Lab 04/26/14 0830 04/26/14 1208 04/26/14 1630 04/26/14 1916 04/26/14 2204 04/27/14 0252 04/27/14 0710 04/27/14 1055  NA 140 136* 137 136* 139  --   --   --   K 2.9* 3.5* 3.6* 3.3* 3.4*  --   --   --   CL 105 100 101 100 102  --   --   --   CO2 20 21 23 26 27   --   --   --   GLUCOSE 127* 177* 199* 206* 93  --   --   --   BUN 4* 4* 3* 3* 3*  --   --   --  CREATININE 0.54 0.57 0.47* 0.48* 0.47*  --   --   --   CALCIUM 8.5 8.2* 8.2* 8.3* 8.5  --   --   --   MG 2.0 1.9 1.7 1.8 1.9 1.8 2.1 1.9   Liver Function Tests:  Recent Labs Lab 04/24/14 0250  AST 38*  ALT 12  ALKPHOS 78  BILITOT 0.2*  PROT 8.9*  ALBUMIN 4.6   CBC:  Recent Labs Lab 04/24/14 0250 04/24/14 0840 04/25/14 1149  WBC 18.2* 19.6* 6.6  NEUTROABS 15.0* 16.4*  --   HGB 15.4 13.7 12.5*  HCT 44.6 40.5 34.2*  MCV 86.8 86.7 80.1  PLT 349 274 177   CBG:  Recent Labs Lab 04/27/14 0243 04/27/14 0349 04/27/14 0846 04/27/14 0943 04/27/14 1211  GLUCAP 119* 77 57*  186* 176*    Recent Results (from the past 240 hour(s))  MRSA PCR Screening     Status: None   Collection Time: 04/24/14  8:19 AM  Result Value Ref Range Status   MRSA by PCR NEGATIVE NEGATIVE Final    Comment:        The GeneXpert MRSA Assay (FDA approved for NASAL specimens only), is one component of a comprehensive MRSA colonization surveillance program. It is not intended to diagnose MRSA infection nor to guide or monitor treatment for MRSA infections.   Culture, blood (routine x 2)     Status: None (Preliminary result)   Collection Time: 04/25/14 11:49 AM  Result Value Ref Range Status   Specimen Description BLOOD LEFT ARM  Final   Special Requests BOTTLES DRAWN AEROBIC AND ANAEROBIC 5CC  Final   Culture  Setup Time   Final    04/25/2014 19:02 Performed at [facility]    Culture   Final           BLOOD CULTURE RECEIVED NO GROWTH TO DATE CULTURE WILL BE HELD FOR 5 DAYS BEFORE ISSUING A FINAL NEGATIVE REPORT Performed at [facility]    Report Status PENDING  Incomplete  Culture, blood (routine x 2)     Status: None (Preliminary result)   Collection Time: 04/25/14 12:10 PM  Result Value Ref Range Status   Specimen Description BLOOD LEFT ARM  Final   Special Requests BOTTLES DRAWN AEROBIC AND ANAEROBIC 5CC  Final   Culture  Setup Time   Final    04/25/2014 19:09 Performed at [facility]    Culture   Final           BLOOD CULTURE RECEIVED NO GROWTH TO DATE CULTURE WILL BE HELD FOR 5 DAYS BEFORE ISSUING A FINAL NEGATIVE REPORT Performed at [facility]    Report Status PENDING  Incomplete  Urine culture     Status: None   Collection Time: 04/25/14  6:51 PM  Result Value Ref Range Status   Specimen Description URINE, CLEAN CATCH  Final   Special Requests NONE  Final   Culture  Setup Time   Final    04/25/2014 19:43 Performed at [facility]    Colony Count NO GROWTH Performed at [facility]   Final   Culture  NO GROWTH Performed at [facility]   Final   Report Status 04/27/2014 FINAL  Final     Studies:  Recent x-ray studies have been reviewed in detail by the Attending Physician  Scheduled Meds:  Scheduled Meds: . antiseptic oral rinse  7 mL Mouth Rinse q12n4p  . atorvastatin  20 mg  Oral q1800  . enoxaparin (LOVENOX) injection  40 mg Subcutaneous Q24H  . insulin aspart  0-15 Units Subcutaneous TID WC  . insulin aspart  0-5 Units Subcutaneous QHS  . insulin aspart protamine- aspart  25 Units Subcutaneous Q supper  . [START ON 04/28/2014] insulin aspart protamine- aspart  30 Units Subcutaneous Q breakfast  . pantoprazole  40 mg Oral Q supper  . potassium chloride  60 mEq Oral BID    Time spent on care of this patient: 35 mins  Lonia BloodJeffrey T. Jettie Lazare, MD [facility] For Consults/Admissions - Flow Manager - [phone] Office  [phone] Pager [phone]  On-Call/Text Page:      [email or web address] [email or web address]      password [email or web address]  04/27/2014, 3:00 PM   LOS: 3 days

## 2014-04-27 NOTE — Progress Notes (Signed)
Utilization review completed.  

## 2014-04-27 NOTE — Progress Notes (Signed)
Inpatient Diabetes Program Recommendations  AACE/ADA: New Consensus Statement on Inpatient Glycemic Control (2013)  Target Ranges:  Prepandial:   less than 140 mg/dL      Peak postprandial:   less than 180 mg/dL (1-2 hours)      Critically ill patients:  140 - 180 mg/dL   Noted am dose of 40/9870/30 held this am due to hypoglycemia of 57 this am.   May want to give patient some basal insulin until supper dose of 70/30 is started. Recommend 10 units lantus for now as 1 time dose. 70/30 doses ordered are less than what he takes at home, thus lantus should be safe to use for one time. Page texted MD with recommendation.  Thank you, Lenor CoffinAnn Yordan Martindale, RN, CNS, Diabetes Coordinator [phone])

## 2014-04-27 NOTE — Progress Notes (Signed)
Hypoglycemic Event  CBG: 57  Treatment: 15 GM carbohydrate snack  Symptoms: None  Follow-up CBG: Time:0940 CBG Result:186  Possible Reasons for Event: Unknown  Comments/MD notified:    Shiflett, Angelina SheriffJamie M  Remember to initiate Hypoglycemia Order Set & complete

## 2014-04-28 LAB — CBC
HCT: 34.3 % — ABNORMAL LOW (ref 39.0–52.0)
Hemoglobin: 12.4 g/dL — ABNORMAL LOW (ref 13.0–17.0)
MCH: 30 pg (ref 26.0–34.0)
MCHC: 36.2 g/dL — ABNORMAL HIGH (ref 30.0–36.0)
MCV: 83.1 fL (ref 78.0–100.0)
PLATELETS: 180 10*3/uL (ref 150–400)
RBC: 4.13 MIL/uL — ABNORMAL LOW (ref 4.22–5.81)
RDW: 12.8 % (ref 11.5–15.5)
WBC: 3.1 10*3/uL — AB (ref 4.0–10.5)

## 2014-04-28 LAB — GLUCOSE, CAPILLARY
GLUCOSE-CAPILLARY: 278 mg/dL — AB (ref 70–99)
GLUCOSE-CAPILLARY: 110 mg/dL — AB (ref 70–99)
GLUCOSE-CAPILLARY: 142 mg/dL — AB (ref 70–99)

## 2014-04-28 LAB — COMPREHENSIVE METABOLIC PANEL
ALBUMIN: 3 g/dL — AB (ref 3.5–5.2)
ALK PHOS: 57 U/L (ref 39–117)
ALT: 10 U/L (ref 0–53)
AST: 19 U/L (ref 0–37)
Anion gap: 8 (ref 5–15)
BILIRUBIN TOTAL: 0.3 mg/dL (ref 0.3–1.2)
BUN: 5 mg/dL — ABNORMAL LOW (ref 6–23)
CO2: 28 mEq/L (ref 19–32)
Calcium: 8.6 mg/dL (ref 8.4–10.5)
Chloride: 105 mEq/L (ref 96–112)
Creatinine, Ser: 0.58 mg/dL (ref 0.50–1.35)
GFR calc Af Amer: >90 mL/min (ref >90)
Glucose, Bld: 136 mg/dL — ABNORMAL HIGH (ref 70–99)
POTASSIUM: 3.5 meq/L — AB (ref 3.7–5.3)
SODIUM: 141 meq/L (ref 137–147)
Total Protein: 5.8 g/dL — ABNORMAL LOW (ref 6.0–8.3)

## 2014-04-28 MED ORDER — LISINOPRIL 5 MG PO TABS: 5.0000 mg | ORAL_TABLET | Freq: Every day | ORAL | Status: DC

## 2014-04-28 MED ORDER — HYDROCHLOROTHIAZIDE 12.5 MG PO CAPS: 12.5000 mg | ORAL_CAPSULE | Freq: Every day | ORAL | Status: DC

## 2014-04-28 MED ORDER — INSULIN ASPART 100 UNIT/ML ~~LOC~~ SOLN: 0.0000 [IU] | Freq: Every day | SUBCUTANEOUS | Status: DC

## 2014-04-28 MED ORDER — INSULIN ASPART PROT & ASPART (70-30 MIX) 100 UNIT/ML ~~LOC~~ SUSP: SUBCUTANEOUS | Status: DC

## 2014-04-28 MED ORDER — ATORVASTATIN CALCIUM 20 MG PO TABS
20.0000 mg | ORAL_TABLET | Freq: Every day | ORAL | Status: DC
Start: 2014-04-28 — End: 2014-07-02

## 2014-04-28 MED ORDER — INSULIN ASPART 100 UNIT/ML FLEXPEN: 4.0000 [IU] | PEN_INJECTOR | Freq: Three times a day (TID) | SUBCUTANEOUS | Status: DC

## 2014-04-28 MED ORDER — ATORVASTATIN CALCIUM 20 MG PO TABS: 20.0000 mg | ORAL_TABLET | Freq: Every day | ORAL | Status: DC

## 2014-04-28 MED ORDER — INSULIN ASPART PROT & ASPART (70-30 MIX) 100 UNIT/ML PEN: PEN_INJECTOR | SUBCUTANEOUS | Status: DC

## 2014-04-28 MED ORDER — INSULIN ASPART 100 UNIT/ML ~~LOC~~ SOLN: 4.0000 [IU] | Freq: Three times a day (TID) | SUBCUTANEOUS | Status: DC

## 2014-04-28 MED ORDER — INSULIN PEN NEEDLE 29G X 12MM MISC: Status: DC

## 2014-04-28 NOTE — Progress Notes (Signed)
CARE MANAGEMENT NOTE 04/28/2014  Patient:  [name] [name],[name] [name]   Account Number:  [id number]  Date Initiated:  04/28/2014  Documentation initiated by:  [facility],Davanna He  Subjective/Objective Assessment:   Type I Diabetic     Action/Plan:   Anticipated DC Date:  04/28/2014   Anticipated DC Plan:  HOME/SELF CARE      DC Planning Services  CM consult  Medication Assistance  PCP issues      Choice offered to / List presented to:             Status of service:  Completed, signed off Medicare Important Message given?   (If response is "NO", the following Medicare IM given date fields will be blank) Date Medicare IM given:   Medicare IM given by:   Date Additional Medicare IM given:   Additional Medicare IM given by:    Discharge Disposition:  HOME/SELF CARE  Per UR Regulation:  Reviewed for med. necessity/level of care/duration of stay  If discussed at Long Length of Stay Meetings, dates discussed:    Comments:  04/28/2014 1600 NCM spoke to pt and states he was getting [facility] medicaitons [facility] [employer] [facility]24.00 per vial. Pt states he currently is not working. [facility] and requested call 12/9 to arrange appt. No available appts for next two days. [facility] to [facility] in am to schedule appt for 12/11 or 12/14. MATCH letter provided for pt to pick up meds for $3.00 at pharmacy. Explained he can only use program once per year. Isidoro DonningAlesia Christa Fasig RN CCM Case Mgmt phone [phone]

## 2014-04-28 NOTE — Discharge Summary (Addendum)
Physician Discharge Summary  [name] [name] ZOX:[id number] DOB: July 07, 1985 DOA: 04/24/2014  PCP: Default, Provider, MD  Admit date: 04/24/2014 Discharge date: 04/28/2014  Time spent:  Recommendations for Outpatient Follow-up:  [name] [name] 28 y.o. BM PMHx diabetes mellitus type 2 uncontrolled, noncompliance with medication, substance abuse (marijuana) . Presented to the ER because of shortness of breath. Patient states that he became short of breath last night suddenly. Patient denies any chest pain or productive cough fever chills. Patient states he did not take his insulin yesterday because he was not feeling well. In the ER patient was found to have elevated anion gap with elevated blood sugar and patient was found to be having a pH of 6.9 in the VBG. Critical care was consulted by the ER physician and at this time patient is being admitted for severe DKA. Patient states that he has not missed his medications except for yesterday. Denies any nausea vomiting abdominal pain diarrhea fever chills.     Discharge Diagnoses:  Principal Problem:   DKA, type 1 Active Problems:   Elevated blood pressure   Diabetic ketoacidosis without coma associated with type 1 diabetes mellitus   Tachycardia   Noncompliance with medication regimen   Substance abuse   Hypokalemia   HLD (hyperlipidemia)   Discharge Condition: Stable  Diet recommendation: Carb modified  Filed Weights   04/24/14 0220  Weight: 58.968 kg (130 lb)    History of present illness:  Diabetes type 2 uncontrolled -Pt was admitted with Dx of DM Type 1, however Pt states was on PO anti-glycemic. Anti-GAD pending, Insulin Ab pending. Patient will be able to discuss findings and his follow-up at the wellness clinic. -12/4 hemoglobin A1c= 11.6 -Discharge on NovoLog 70/30, 25 units with dinner; 35 units breakfast -Discharge on NovoLog 4 units with meals -Ensure you keep logbook of all CBG readings, as well as all food  consumed. Ensure you take logbook to all follow-up appointments  HLD - Not Within ADA Guidelines start Lipitor 20 mg Daily  Hypokalemia -Resolved   Substance abuse -Patient positive for marijuana counseled on abstinence  Noncompliance with medication -Patient has been noncompliant with medication, does not have a PCP to help manage his diabetes. -Consult to social work to help with obtaining PCP. Addendum review of diabetic coordinator's notes shows that in February 2015 they had attempted to get Patient tied into the system i.e. new PCP, counseled on medication,counseled on sequela of poorly controlled diabetes. Patient did not follow through. During his hospitalization patient was counseled at length concerning the sequela of uncontrolled diabetes to include loss of vision, loss of renal function, and death. After DKA resolved patient was started on NovoLog 70/30, and NovoLog with meals. Patient will follow-up at the wellness Center.     Consultants: NA  Procedure/Significant Events: NA   Culture 12/4 MRSA by PCR negative 12/5 blood Lt Arm 2 NGTD 12/5 urine negative     Discharge Exam: Filed Vitals:   04/28/14 0700 04/28/14 0707 04/28/14 1104 04/28/14 1105  BP:  126/89 146/96   Pulse:  106 113   Temp: 98.3 F (36.8 C)   99 F (37.2 C)  TempSrc: Oral   Oral  Resp:  10 13   Height:      Weight:      SpO2:  100% 97%     General: A/O 4, NAD, No acute respiratory distress Lungs: Clear to auscultation bilaterally without wheezes or crackles Cardiovascular: Regular rate and rhythm without murmur gallop or rub  normal S1 and S2 Abdomen: Nontender, nondistended, soft, bowel sounds positive, no rebound, no ascites, no appreciable mass Extremities: No significant cyanosis, clubbing, or edema bilateral lower extremities Discharge Instructions     Medication List    STOP taking these medications        insulin NPH-regular Human (70-30) 100 UNIT/ML injection   Commonly known as:  NOVOLIN 70/30 RELION      TAKE these medications        atorvastatin 20 MG tablet  Commonly known as:  LIPITOR  Take 1 tablet (20 mg total) by mouth daily at 6 PM.     hydrochlorothiazide 12.5 MG capsule  Commonly known as:  MICROZIDE  Take 1 capsule (12.5 mg total) by mouth daily.     insulin aspart 100 UNIT/ML injection  Commonly known as:  NOVOLOG  Inject 4 Units into the skin 3 (three) times daily before meals.     insulin aspart protamine- aspart (70-30) 100 UNIT/ML injection  Commonly known as:  NOVOLOG MIX 70/30  - 35 units with breakfast  - 25 units with dinner     lisinopril 5 MG tablet  Commonly known as:  PRINIVIL,ZESTRIL  Take 1 tablet (5 mg total) by mouth daily.       No Known Allergies Follow-up Information    Follow up with [facility]    . Schedule an appointment as soon as possible for a visit in 1 week.   Why:  Follow-up hospitalization; DKA, noncompliance with medication, substance abuse.   Contact information:   [address] [place] [address] [phone]       The results of significant diagnostics from this hospitalization (including imaging, microbiology, ancillary and laboratory) are listed below for reference.    Significant Diagnostic Studies: Ct Angio Chest Pe W/cm &/or Wo Cm  04/24/2014   CLINICAL DATA:  Shortness of breath and tachycardia since yesterday evening  EXAM: CT ANGIOGRAPHY CHEST WITH CONTRAST  TECHNIQUE: Multidetector CT imaging of the chest was performed using the standard protocol during bolus administration of intravenous contrast. Multiplanar CT image reconstructions and MIPs were obtained to evaluate the vascular anatomy.  CONTRAST:  OMNIPAQUE IOHEXOL 350 MG/ML SOLN  COMPARISON:  Chest radiograph same date  FINDINGS: The examination is adequate for evaluation for acute pulmonary embolism up to and including the 3rd order pulmonary arteries. No  focal filling defect is identified up to and including the 3rd order pulmonary arteries to suggest acute pulmonary embolism. Heart size is normal. No lymphadenopathy. The there is diffuse segmental mid to distal esophageal wall thickening without measurable mass identified. Great vessels are normal in caliber.  Patchy ill-defined airspace consolidation is identified within the medial bilateral upper lobes, left greater than right, for example image 38 and image 44. There is also minimal patchy left lower lobe airspace consolidation abutting the diaphragms. No pleural effusion. No acute osseous abnormality.  Review of the MIP images confirms the above findings.  IMPRESSION: Multi lobar patchy airspace opacities, with an appearance that could suggest pneumonia although other alveolar filling processes are possible. No CT evidence for acute pulmonary embolism.  Diffuse segmental mid-distal esophageal wall thickening which may be seen with esophagitis, infiltrative disorders, or less likely neoplasm given the young patient age.  Followup chest CT is recommended in 3 months, presumably after treatment, to document resolution.   Electronically Signed   By: Christiana Pellant M.D.   On: 04/24/2014 18:51   Dg Chest [facility]  1 View  04/24/2014   CLINICAL DATA:  Acute onset of shortness of breath. Hyperglycemia. Initial encounter.  EXAM: PORTABLE CHEST - 1 VIEW  COMPARISON:  Chest radiograph performed 07/07/2013  FINDINGS: The lungs are well-aerated and clear. There is no evidence of focal opacification, pleural effusion or pneumothorax.  The cardiomediastinal silhouette is within normal limits. No acute osseous abnormalities are seen.  IMPRESSION: No acute cardiopulmonary process seen.   Electronically Signed   By: Roanna RaiderJeffery  Chang M.D.   On: 04/24/2014 04:21    Microbiology: Recent Results (from the past 240 hour(s))  MRSA PCR Screening     Status: None   Collection Time: 04/24/14  8:19 AM  Result Value Ref Range Status    MRSA by PCR NEGATIVE NEGATIVE Final    Comment:        The GeneXpert MRSA Assay (FDA approved for NASAL specimens only), is one component of a comprehensive MRSA colonization surveillance program. It is not intended to diagnose MRSA infection nor to guide or monitor treatment for MRSA infections.   Culture, blood (routine x 2)     Status: None (Preliminary result)   Collection Time: 04/25/14 11:49 AM  Result Value Ref Range Status   Specimen Description BLOOD LEFT ARM  Final   Special Requests BOTTLES DRAWN AEROBIC AND ANAEROBIC 5CC  Final   Culture  Setup Time   Final    04/25/2014 19:02 Performed at [facility]    Culture   Final           BLOOD CULTURE RECEIVED NO GROWTH TO DATE CULTURE WILL BE HELD FOR 5 DAYS BEFORE ISSUING A FINAL NEGATIVE REPORT Performed at [facility]    Report Status PENDING  Incomplete  Culture, blood (routine x 2)     Status: None (Preliminary result)   Collection Time: 04/25/14 12:10 PM  Result Value Ref Range Status   Specimen Description BLOOD LEFT ARM  Final   Special Requests BOTTLES DRAWN AEROBIC AND ANAEROBIC 5CC  Final   Culture  Setup Time   Final    04/25/2014 19:09 Performed at [facility]    Culture   Final           BLOOD CULTURE RECEIVED NO GROWTH TO DATE CULTURE WILL BE HELD FOR 5 DAYS BEFORE ISSUING A FINAL NEGATIVE REPORT Performed at [facility]    Report Status PENDING  Incomplete  Urine culture     Status: None   Collection Time: 04/25/14  6:51 PM  Result Value Ref Range Status   Specimen Description URINE, CLEAN CATCH  Final   Special Requests NONE  Final   Culture  Setup Time   Final    04/25/2014 19:43 Performed at [facility]    Colony Count NO GROWTH Performed at [facility]   Final   Culture NO GROWTH Performed at [facility]   Final   Report Status 04/27/2014 FINAL  Final     Labs: Basic Metabolic Panel:  Recent Labs Lab 04/26/14 1630  04/26/14 1916 04/26/14 2204 04/27/14 0252 04/27/14 0710 04/27/14 1055 04/27/14 1828 04/28/14 0031  NA 137 136* 139  --   --   --  137 141  K 3.6* 3.3* 3.4*  --   --   --  4.1 3.5*  CL 101 100 102  --   --   --  100 105  CO2 23 26 27   --   --   --  31 28  GLUCOSE 199* 206* 93  --   --   --  246* 136*  BUN 3* 3* 3*  --   --   --  5* 5*  CREATININE 0.47* 0.48* 0.47*  --   --   --  0.72 0.58  CALCIUM 8.2* 8.3* 8.5  --   --   --  8.8 8.6  MG 1.7 1.8 1.9 1.8 2.1 1.9  --   --    Liver Function Tests:  Recent Labs Lab 04/24/14 0250 04/28/14 0031  AST 38* 19  ALT 12 10  ALKPHOS 78 57  BILITOT 0.2* 0.3  PROT 8.9* 5.8*  ALBUMIN 4.6 3.0*   No results for input(s): LIPASE, AMYLASE in the last 168 hours. No results for input(s): AMMONIA in the last 168 hours. CBC:  Recent Labs Lab 04/24/14 0250 04/24/14 0840 04/25/14 1149 04/28/14 0031  WBC 18.2* 19.6* 6.6 3.1*  NEUTROABS 15.0* 16.4*  --   --   HGB 15.4 13.7 12.5* 12.4*  HCT 44.6 40.5 34.2* 34.3*  MCV 86.8 86.7 80.1 83.1  PLT 349 274 177 180   Cardiac Enzymes: No results for input(s): CKTOTAL, CKMB, CKMBINDEX, TROPONINI in the last 168 hours. BNP: BNP (last 3 results) No results for input(s): PROBNP in the last 8760 hours. CBG:  Recent Labs Lab 04/27/14 1211 04/27/14 1615 04/27/14 2208 04/28/14 0801 04/28/14 1230  GLUCAP 176* 250* 74 278* 110*       Signed:  Carolyne Littlesurtis Woods, MD [facility] [phone] pager

## 2014-04-28 NOTE — Progress Notes (Signed)
Discharge instructions provided. Copy of MATCH medication assistance card provided along with scripts.  [facility]-patient to call Monday.  PIV removed-VSS-all meds current to time.  All belongs packed and sent with patient.

## 2014-04-28 NOTE — Progress Notes (Deleted)
Report called to [facility] RN. Discharge paperwork set to be given to [facility] once patient is picked up. Provided a copy of the discharge paperwork to the family. VSS meds current to time, belongs packed to be sent with patient to SNF.  

## 2014-04-28 NOTE — Progress Notes (Signed)
Inpatient Diabetes Program Recommendations  AACE/ADA: New Consensus Statement on Inpatient Glycemic Control (2013)  Target Ranges:  Prepandial:   less than 140 mg/dL      Peak postprandial:   less than 180 mg/dL (1-2 hours)      Critically ill patients:  140 - 180 mg/dL   Noted HS glucose down to 74 mg/dL last HS-however, this was probably due to the little bit of lantus given yesterday afternoon to substitute as basal until given his 70/30 dose ac supper. Fasting glucose this am high at 278 mg/dL after given 40/9870/30 25 units ac supper yesterday evening. (Home dose of 70/30 is 35 units ac supper)-may need increase the pm 70/30 dose to 30-35 units. Given 35 units units 70/30 this am ac breakfast (home doe is 40 units ac breakfast). Will follow and glad to assist as needed.  Thank you, Lenor CoffinAnn  Shellhammer, RN, CNS, Diabetes Coordinator [phone])

## 2014-04-28 NOTE — Plan of Care (Signed)
Problem: Phase I Progression Outcomes Goal: CBGs steadily decreasing on IV insulin drip Outcome: Completed/Met Date Met:  04/28/14 Goal: Monitor hydration status Outcome: Completed/Met Date Met:  04/28/14 Goal: Acidosis resolving Outcome: Completed/Met Date Met:  04/28/14 Goal: K+ level approaching normal with therapy Outcome: Progressing Goal: Pain controlled with appropriate interventions Outcome: Completed/Met Date Met:  04/28/14 Goal: OOB as tolerated unless otherwise ordered Outcome: Completed/Met Date Met:  04/28/14 Goal: Initial discharge plan identified Outcome: Completed/Met Date Met:  04/28/14

## 2014-05-01 ENCOUNTER — Ambulatory Visit: Payer: Self-pay | Attending: Family Medicine | Admitting: Family Medicine

## 2014-05-01 ENCOUNTER — Encounter: Payer: Self-pay | Admitting: Family Medicine

## 2014-05-01 VITALS — BP 123/83 | HR 108 | Temp 98.9°F | Resp 16 | Ht 66.0 in | Wt 114.0 lb

## 2014-05-01 DIAGNOSIS — E119 Type 2 diabetes mellitus without complications: Secondary | ICD-10-CM

## 2014-05-01 DIAGNOSIS — IMO0002 Reserved for concepts with insufficient information to code with codable children: Secondary | ICD-10-CM

## 2014-05-01 DIAGNOSIS — E1065 Type 1 diabetes mellitus with hyperglycemia: Secondary | ICD-10-CM

## 2014-05-01 DIAGNOSIS — Z87891 Personal history of nicotine dependence: Secondary | ICD-10-CM | POA: Insufficient documentation

## 2014-05-01 DIAGNOSIS — E1010 Type 1 diabetes mellitus with ketoacidosis without coma: Secondary | ICD-10-CM | POA: Insufficient documentation

## 2014-05-01 LAB — POCT URINALYSIS DIPSTICK
BILIRUBIN UA: NEGATIVE
GLUCOSE UA: 500
Leukocytes, UA: NEGATIVE
NITRITE UA: NEGATIVE
Protein, UA: NEGATIVE
Spec Grav, UA: 1.02
Urobilinogen, UA: 0.2
pH, UA: 5

## 2014-05-01 LAB — CULTURE, BLOOD (ROUTINE X 2)
CULTURE: NO GROWTH
Culture: NO GROWTH

## 2014-05-01 LAB — GLUCOSE, POCT (MANUAL RESULT ENTRY): POC GLUCOSE: 401 mg/dL — AB (ref 70–99)

## 2014-05-01 LAB — INSULIN ANTIBODIES, BLOOD: Insulin Antibodies, Human: 5.2 U/mL — ABNORMAL HIGH (ref <0.4)

## 2014-05-01 MED ORDER — INSULIN ASPART 100 UNIT/ML ~~LOC~~ SOLN
10.0000 [IU] | Freq: Once | SUBCUTANEOUS | Status: AC
  Administered 2014-05-01: 10 [IU] via SUBCUTANEOUS

## 2014-05-01 NOTE — Assessment & Plan Note (Signed)
Concern for recurrent DKA.  Patient sent to ED via private vehicle.

## 2014-05-01 NOTE — Patient Instructions (Signed)
[name],  Thank you for coming in today. It was a pleasure meeting you. I look forward to being your primary doctor.   Unfortunately, we will have to send you back to the hospital to rule out recurrent DKA.  Remember not to use old insulin that has been out of the fridge > 30 days.   Please f/u with me once out of the ED/hospital.   Dr. Armen PickupFunches

## 2014-05-01 NOTE — Progress Notes (Signed)
   Subjective:    Patient ID: [name] [name], male    DOB: 1985-10-29, 28 y.o.   MRN: [id number] CC: HFU for DKA   HPI 28 yo M presents for HFU for DKA  1. DKA:  Patient hospitalized from 04/24/14-04/28/14 for DKA.  He took 15 U of humolog 75/25 last night. He has the insulin with him in his pocket. It is a vial that is 2-3 months old. He admits to palpitations. He denies HA, dizziness, vision change, N/V/D, abdominal pain. He denies tingling or numbness in extremities.   Soc Hx: former smoker, quit in 09/2013 Med Hx: DM dx in 2011 Fam Hx: DM2 in father  Review of Systems As per HPI     Objective:   Physical Exam BP 123/83 mmHg  Pulse 108  Temp(Src) 98.9 F (37.2 C) (Oral)  Resp 16  Ht 5\' 6"  (1.676 m)  Wt 114 lb (51.71 kg)  BMI 18.41 kg/m2  SpO2 100% General appearance: alert, cooperative and no distress Lungs: clear to auscultation bilaterally Heart: S1, S2 normal, tachycardia  Abdomen: soft, flat, non tender  Extremities: extremities normal, atraumatic, no cyanosis or edema  Lab Results  Component Value Date   HGBA1C 11.6* 04/24/2014   Reviewed UA: Glucose 500, Ketones > 160 CBG 401  novolog 10 U  given at 10:10 AM      Assessment & Plan:

## 2014-05-01 NOTE — Progress Notes (Signed)
Establish Care HFU uncontrol DM

## 2014-05-01 NOTE — Progress Notes (Signed)
Pt being transferred to [name] vis private vehicle for < 160 + ketones Report given to [employer] Novolog 10 units given

## 2014-05-02 LAB — MICROALBUMIN / CREATININE URINE RATIO
Creatinine, Urine: 46 mg/dL
Microalb Creat Ratio: 17.4 mg/g (ref 0.0–30.0)
Microalb, Ur: 0.8 mg/dL (ref <2.0)

## 2014-05-04 ENCOUNTER — Telehealth: Payer: Self-pay | Admitting: Family Medicine

## 2014-05-04 MED ORDER — TRUEPLUS LANCETS 28G MISC: 1.0000 | Freq: Three times a day (TID) | Status: DC

## 2014-05-04 NOTE — Telephone Encounter (Signed)
Called patient when I reviewed his lab results and saw that he did not go to the ED as instructed.  He told me that he was too busy to go to the ED that day. He apologized. He is doing well. He threw out the old insulin. He still needs lancets.  Plan: Prescribed lancets to onsite pharmacy. Patient to come get lancets and make a f/u appt w/in next few weeks. I informed patient that urine ACR was normal.

## 2014-05-05 LAB — GLUTAMIC ACID DECARBOXYLASE AUTO ABS: GLUTAMIC ACID DECARB AB: 1.5 U/mL — AB (ref <=1.0)

## 2014-05-19 ENCOUNTER — Telehealth: Payer: Self-pay | Admitting: *Deleted

## 2014-05-19 NOTE — Telephone Encounter (Signed)
-----   Message from Lora PaulaJosalyn C Funches, MD sent at 05/04/2014  1:49 PM EST ----- normal urine albumin/creatinine

## 2014-05-19 NOTE — Telephone Encounter (Signed)
Unable to contact pt.  Do not accept incoming call

## 2014-06-30 ENCOUNTER — Encounter (HOSPITAL_COMMUNITY): Payer: Self-pay | Admitting: Emergency Medicine

## 2014-06-30 ENCOUNTER — Emergency Department (HOSPITAL_COMMUNITY): Payer: Self-pay

## 2014-06-30 ENCOUNTER — Inpatient Hospital Stay (HOSPITAL_COMMUNITY)
Admission: EM | Admit: 2014-06-30 | Discharge: 2014-07-02 | DRG: 637 | Disposition: A | Discharge status: Home / Self Care | Payer: Self-pay | Attending: Internal Medicine | Admitting: Internal Medicine

## 2014-06-30 DIAGNOSIS — Z794 Long term (current) use of insulin: Secondary | ICD-10-CM

## 2014-06-30 DIAGNOSIS — I10 Essential (primary) hypertension: Secondary | ICD-10-CM | POA: Diagnosis present

## 2014-06-30 DIAGNOSIS — Z9119 Patient's noncompliance with other medical treatment and regimen: Secondary | ICD-10-CM | POA: Diagnosis present

## 2014-06-30 DIAGNOSIS — Z87891 Personal history of nicotine dependence: Secondary | ICD-10-CM

## 2014-06-30 DIAGNOSIS — G9341 Metabolic encephalopathy: Secondary | ICD-10-CM | POA: Diagnosis present

## 2014-06-30 DIAGNOSIS — E1065 Type 1 diabetes mellitus with hyperglycemia: Secondary | ICD-10-CM | POA: Diagnosis present

## 2014-06-30 DIAGNOSIS — E785 Hyperlipidemia, unspecified: Secondary | ICD-10-CM | POA: Diagnosis present

## 2014-06-30 DIAGNOSIS — E1310 Other specified diabetes mellitus with ketoacidosis without coma: Secondary | ICD-10-CM | POA: Diagnosis present

## 2014-06-30 DIAGNOSIS — E1010 Type 1 diabetes mellitus with ketoacidosis without coma: Principal | ICD-10-CM | POA: Diagnosis present

## 2014-06-30 DIAGNOSIS — Z992 Dependence on renal dialysis: Secondary | ICD-10-CM

## 2014-06-30 DIAGNOSIS — R000 Tachycardia, unspecified: Secondary | ICD-10-CM | POA: Diagnosis present

## 2014-06-30 DIAGNOSIS — N179 Acute kidney failure, unspecified: Secondary | ICD-10-CM | POA: Diagnosis present

## 2014-06-30 DIAGNOSIS — E860 Dehydration: Secondary | ICD-10-CM | POA: Diagnosis present

## 2014-06-30 DIAGNOSIS — E1110 Type 2 diabetes mellitus with ketoacidosis without coma: Secondary | ICD-10-CM | POA: Diagnosis present

## 2014-06-30 LAB — COMPREHENSIVE METABOLIC PANEL
ALBUMIN: 4.1 g/dL (ref 3.5–5.2)
ALK PHOS: 76 U/L (ref 39–117)
ALT: 18 U/L (ref 0–53)
AST: 16 U/L (ref 0–37)
Anion gap: 22 — ABNORMAL HIGH (ref 5–15)
BILIRUBIN TOTAL: 1.4 mg/dL — AB (ref 0.3–1.2)
BUN: 22 mg/dL (ref 6–23)
CHLORIDE: 104 mmol/L (ref 96–112)
CO2: 5 mmol/L — AB (ref 19–32)
Calcium: 8.3 mg/dL — ABNORMAL LOW (ref 8.4–10.5)
Creatinine, Ser: 1.52 mg/dL — ABNORMAL HIGH (ref 0.50–1.35)
GFR calc Af Amer: 70 mL/min — ABNORMAL LOW (ref >90)
GFR, EST NON AFRICAN AMERICAN: 60 mL/min — AB (ref >90)
Glucose, Bld: 356 mg/dL — ABNORMAL HIGH (ref 70–99)
Potassium: 4.1 mmol/L (ref 3.5–5.1)
Sodium: 131 mmol/L — ABNORMAL LOW (ref 135–145)
Total Protein: 7.6 g/dL (ref 6.0–8.3)

## 2014-06-30 LAB — CBC WITH DIFFERENTIAL/PLATELET
BASOS PCT: 0 % (ref 0–1)
Basophils Absolute: 0 10*3/uL (ref 0.0–0.1)
EOS PCT: 0 % (ref 0–5)
Eosinophils Absolute: 0 10*3/uL (ref 0.0–0.7)
HEMATOCRIT: 43.7 % (ref 39.0–52.0)
Hemoglobin: 15.8 g/dL (ref 13.0–17.0)
Lymphocytes Relative: 8 % — ABNORMAL LOW (ref 12–46)
Lymphs Abs: 0.9 10*3/uL (ref 0.7–4.0)
MCH: 30.9 pg (ref 26.0–34.0)
MCHC: 36.2 g/dL — ABNORMAL HIGH (ref 30.0–36.0)
MCV: 85.4 fL (ref 78.0–100.0)
MONO ABS: 0.9 10*3/uL (ref 0.1–1.0)
Monocytes Relative: 8 % (ref 3–12)
NEUTROS ABS: 9.9 10*3/uL — AB (ref 1.7–7.7)
NEUTROS PCT: 84 % — AB (ref 43–77)
Platelets: 306 10*3/uL (ref 150–400)
RBC: 5.12 MIL/uL (ref 4.22–5.81)
RDW: 12.5 % (ref 11.5–15.5)
WBC: 11.8 10*3/uL — ABNORMAL HIGH (ref 4.0–10.5)

## 2014-06-30 LAB — URINALYSIS, ROUTINE W REFLEX MICROSCOPIC
Glucose, UA: 500 mg/dL — AB
Ketones, ur: >80 mg/dL — AB
Leukocytes, UA: NEGATIVE
NITRITE: NEGATIVE
PH: 5.5 (ref 5.0–8.0)
Protein, ur: 100 mg/dL — AB
Specific Gravity, Urine: >1.03 — ABNORMAL HIGH (ref 1.005–1.030)
Urobilinogen, UA: 0.2 mg/dL (ref 0.0–1.0)

## 2014-06-30 LAB — URINE MICROSCOPIC-ADD ON

## 2014-06-30 LAB — CBG MONITORING, ED
GLUCOSE-CAPILLARY: 350 mg/dL — AB (ref 70–99)
Glucose-Capillary: 304 mg/dL — ABNORMAL HIGH (ref 70–99)

## 2014-06-30 LAB — I-STAT VENOUS BLOOD GAS, ED
Acid-base deficit: 22 mmol/L — ABNORMAL HIGH (ref 0.0–2.0)
Bicarbonate: 6 mEq/L — ABNORMAL LOW (ref 20.0–24.0)
O2 Saturation: 54 %
PCO2 VEN: 20.3 mmHg — AB (ref 45.0–50.0)
TCO2: 7 mmol/L (ref 0–100)
pH, Ven: 7.078 — CL (ref 7.250–7.300)
pO2, Ven: 38 mmHg (ref 30.0–45.0)

## 2014-06-30 LAB — KETONES, QUALITATIVE

## 2014-06-30 MED ORDER — SODIUM CHLORIDE 0.9 % IV BOLUS (SEPSIS)
2000.0000 mL | Freq: Once | INTRAVENOUS | Status: AC
Start: 2014-06-30 — End: 2014-06-30
  Administered 2014-06-30: 2000 mL via INTRAVENOUS

## 2014-06-30 MED ORDER — ONDANSETRON HCL 4 MG/2ML IJ SOLN: 4.0000 mg | Freq: Once | INTRAMUSCULAR | Status: DC

## 2014-06-30 MED ORDER — SODIUM CHLORIDE 0.9 % IV SOLN
INTRAVENOUS | Status: DC
  Administered 2014-06-30: 22:00:00 via INTRAVENOUS

## 2014-06-30 MED ORDER — DEXTROSE-NACL 5-0.45 % IV SOLN: INTRAVENOUS | Status: DC

## 2014-06-30 MED ORDER — DEXTROSE 50 % IV SOLN: 25.0000 mL | INTRAVENOUS | Status: DC | PRN

## 2014-06-30 MED ORDER — INSULIN REGULAR BOLUS VIA INFUSION
0.0000 [IU] | Freq: Three times a day (TID) | INTRAVENOUS | Status: DC
  Filled 2014-06-30: qty 10

## 2014-06-30 MED ORDER — SODIUM CHLORIDE 0.9 % IV SOLN
INTRAVENOUS | Status: DC
  Administered 2014-06-30: 2.4 [IU]/h via INTRAVENOUS
  Filled 2014-06-30: qty 2.5

## 2014-06-30 NOTE — ED Notes (Signed)
Pt CBG 350, RN at bedside

## 2014-06-30 NOTE — ED Provider Notes (Signed)
CSN: [id number]     Arrival date & time 06/30/14  1928 History   First MD Initiated Contact with Patient 06/30/14 1928     Chief Complaint  Patient presents with  . Hyperglycemia     (Consider location/radiation/quality/duration/timing/severity/associated sxs/prior Treatment) HPI Kentrel Clevenger is a 29 y.o. male with history of poorly controlled type 1 diabetes, presents to emergency department complaining of fatigue, nausea, some shortness of breath. States he is very thirsty and has been drinking a lot of water. He states he hasn't eaten anything in about 3 days. States nauseated and has no appetite. Has generalized weakness. He has been taking his insulin as prescribed, states he takes NovoLog 3 times a day as well as supplemental with meals. He has taken his NovoLog twice today, last dose around 4 PM. Patient states he has history of DKA and this feels the same. He denies any recent fever, chills, or illnesses. No cough or upper respiratory type infection. No urinary symptoms. No abdominal pain or back pain. Nothing making his symptoms better or worse.  Past Medical History  Diagnosis Date  . Diabetes mellitus without complication Dx 2012  . Hypertension Dx 2015  . Renal insufficiency 2011    pt was on dialysis for a short time approx 2011, pt is no longer on dialysis   History reviewed. No pertinent past surgical history. Family History  Problem Relation Age of Onset  . Diabetes Father   . Diabetes Maternal Grandmother   . Heart disease Neg Hx   . Hypertension Neg Hx    History  Substance Use Topics  . Smoking status: Former Smoker    Quit date: 09/19/2013  . Smokeless tobacco: Never Used  . Alcohol Use: No    Review of Systems  Constitutional: Positive for fatigue. Negative for fever and chills.  Respiratory: Positive for shortness of breath. Negative for cough and chest tightness.   Cardiovascular: Negative for chest pain, palpitations and leg swelling.  Gastrointestinal:  Positive for nausea and vomiting. Negative for abdominal pain, diarrhea and abdominal distention.  Endocrine: Positive for polydipsia and polyphagia.  Genitourinary: Negative for dysuria, urgency, frequency and hematuria.  Musculoskeletal: Negative for myalgias, arthralgias, neck pain and neck stiffness.  Skin: Negative for rash.  Neurological: Positive for weakness. Negative for dizziness, light-headedness, numbness and headaches.  All other systems reviewed and are negative.     Allergies  Review of patient's allergies indicates no known allergies.  Home Medications   Prior to Admission medications   Medication Sig Start Date End Date Taking? Authorizing Provider  atorvastatin (LIPITOR) 20 MG tablet Take 1 tablet (20 mg total) by mouth daily at 6 PM. 04/28/14   Drema Dallas, MD  hydrochlorothiazide (MICROZIDE) 12.5 MG capsule Take 1 capsule (12.5 mg total) by mouth daily. 04/28/14   Drema Dallas, MD  insulin aspart (NOVOLOG) 100 UNIT/ML injection Inject 4 Units into the skin 3 (three) times daily before meals. 04/28/14   Drema Dallas, MD  insulin aspart protamine- aspart (NOVOLOG MIX 70/30) (70-30) 100 UNIT/ML injection 35 units with breakfast 25 units with dinner 04/28/14   Drema Dallas, MD  lisinopril (PRINIVIL,ZESTRIL) 5 MG tablet Take 1 tablet (5 mg total) by mouth daily. 04/28/14   Drema Dallas, MD  TRUEPLUS LANCETS 28G MISC 1 each by Does not apply route 3 (three) times daily. 05/04/14   Josalyn C Funches, MD   BP 131/83 mmHg  Pulse 114  Temp(Src) 97.8 F (36.6 C) (Oral)  Resp 18  SpO2 100% Physical Exam  Constitutional: He is oriented to person, place, and time. He appears well-developed and well-nourished. No distress.  HENT:  Head: Normocephalic and atraumatic.  Oral mucosa dry  Eyes: Conjunctivae are normal.  Neck: Neck supple.  Cardiovascular: Regular rhythm and normal heart sounds.  Tachycardia present.   Pulmonary/Chest: Effort normal. No respiratory  distress. He has no wheezes. He has no rales.  Kussmaul respirations  Abdominal: Soft. Bowel sounds are normal. He exhibits no distension. There is no tenderness. There is no rebound and no guarding.  Musculoskeletal: He exhibits no edema.  Neurological: He is alert and oriented to person, place, and time.  Skin: Skin is warm and dry.  Nursing note and vitals reviewed.   ED Course  Procedures (including critical care time) Labs Review Labs Reviewed  CBC WITH DIFFERENTIAL/PLATELET - Abnormal; Notable for the following:    WBC 11.8 (*)    MCHC 36.2 (*)    Neutrophils Relative % 84 (*)    Neutro Abs 9.9 (*)    Lymphocytes Relative 8 (*)    All other components within normal limits  COMPREHENSIVE METABOLIC PANEL - Abnormal; Notable for the following:    Sodium 131 (*)    CO2 5 (*)    Glucose, Bld 356 (*)    Creatinine, Ser 1.52 (*)    Calcium 8.3 (*)    Total Bilirubin 1.4 (*)    GFR calc non Af Amer 60 (*)    GFR calc Af Amer 70 (*)    Anion gap 22 (*)    All other components within normal limits  KETONES, QUALITATIVE - Abnormal; Notable for the following:    Acetone, Bld LARGE (*)    All other components within normal limits  URINALYSIS, ROUTINE W REFLEX MICROSCOPIC - Abnormal; Notable for the following:    Specific Gravity, Urine >1.030 (*)    Glucose, UA 500 (*)    Hgb urine dipstick SMALL (*)    Bilirubin Urine SMALL (*)    Ketones, ur >80 (*)    Protein, ur 100 (*)    All other components within normal limits  URINE MICROSCOPIC-ADD ON - Abnormal; Notable for the following:    Casts HYALINE CASTS (*)    All other components within normal limits  I-STAT VENOUS BLOOD GAS, ED - Abnormal; Notable for the following:    pH, Ven 7.078 (*)    pCO2, Ven 20.3 (*)    Bicarbonate 6.0 (*)    Acid-base deficit 22.0 (*)    All other components within normal limits  CBG MONITORING, ED - Abnormal; Notable for the following:    Glucose-Capillary 350 (*)    All other components  within normal limits  CBG MONITORING, ED - Abnormal; Notable for the following:    Glucose-Capillary 304 (*)    All other components within normal limits  MRSA PCR SCREENING    Imaging Review Dg Chest Portable 1 View  06/30/2014   CLINICAL DATA:  Hyperglycemia for 1 day. No chest complaints. Nonsmoker. History of hypertension, diabetes, renal insufficiency.  EXAM: PORTABLE CHEST - 1 VIEW  COMPARISON:  04/24/2014  FINDINGS: The heart size and mediastinal contours are within normal limits. Both lungs are clear. The visualized skeletal structures are unremarkable.  IMPRESSION: No active disease.   Electronically Signed   By: Burman Nieves M.D.   On: 06/30/2014 20:33     EKG Interpretation   Date/Time:  Tuesday June 30 2014 20:14:13 EST Ventricular  Rate:  117 PR Interval:  138 QRS Duration: 80 QT Interval:  304 QTC Calculation: 424 R Axis:   80 Text Interpretation:  Sinus tachycardia Probable anteroseptal infarct, old  No significant change was found Confirmed by CAMPOS  MD, Caryn BeeKEVIN ([id number]) on  06/30/2014 9:51:00 PM      MDM   Final diagnoses:  Diabetic ketoacidosis without coma associated with type 1 diabetes mellitus    Patient type I diabetic, history of DKA, presents with generalized weakness, nausea, Kussmaul respirations.  He is tachycardic. Concerning for DKA. Will start fluids. Get labs, ua.    pts pH 7.08, Bicarb 6, Ketones positive. Anion gap 22. Insulin drip started. Pt is mentating well. Continues to have tachycardia. BP normal. Spoke with [facility], they will admit pt.   Filed Vitals:   06/30/14 2118 06/30/14 2200 06/30/14 2355 06/30/14 2359  BP:  136/78  136/76  Pulse:  119 122   Temp:      TempSrc:      Resp:  19 29 23   Height: 5\' 6"  (1.676 m)  5\' 6"  (1.676 m)   Weight: 110 lb 8 oz (50.122 kg)  110 lb 10.7 oz (50.2 kg)   SpO2:   100% 100%     Lottie Musselatyana A Ernestine Langworthy, PA-C 07/01/14 0011  Lyanne CoKevin M Campos, MD 07/01/14 1452

## 2014-06-30 NOTE — ED Notes (Signed)
CBG 304 

## 2014-06-30 NOTE — ED Notes (Addendum)
Critical lab CO2 5. Tatyana PA informed.

## 2014-06-30 NOTE — ED Notes (Signed)
VBG reported to Sunocoatyana-PA

## 2014-06-30 NOTE — ED Notes (Signed)
CBG 350 

## 2014-06-30 NOTE — ED Notes (Signed)
Per EMS: Called to patient's home by sister. Patient feeling fatigued with nausea and abdominal pain that started yesterday. Patient states history if DM with history of DKA 6 months ago.  Patient states he took 15 units SQ of insulin today around 4pm.

## 2014-06-30 NOTE — H&P (Signed)
[facility] History and Physical  [name] Mastnthony Basford JXB:[id number] DOB: May 21, 1986 DOA: 06/30/2014  Referring physician: Jaynie Crumbleatyana Kirichenko, MD PCP: Lora PaulaFUNCHES, JOSALYN C, MD   Chief Complaint: DKA  HPI: [name] [name] is a 29 y.o. male presents with DKA. Patient was last admitted in December. He states that he has not had any appetite. He states that he has been having some vomiting. He denies cough or congestion. He states that he has no urinary problems. He states that he has been taking his medications as prescribed. No fevers noted. He has no headaches noted. No abdominal pain. No diarrhea noted. He states he has no visual problems. Patient came into the ED and his initial was 356. He had an ABG done and this shows a pH of 7.078 but it is noted to be a venous gas. He states he does not smoke or drink.   Review of Systems:  10 point ROS performed and is unremarkable other than noted in HPI  Past Medical History  Diagnosis Date  . Diabetes mellitus without complication Dx 2012  . Hypertension Dx 2015  . Renal insufficiency 2011    pt was on dialysis for a short time approx 2011, pt is no longer on dialysis   History reviewed. No pertinent past surgical history. Social History:  reports that he quit smoking about 9 months ago. He has never used smokeless tobacco. He reports that he does not drink alcohol or use illicit drugs.  No Known Allergies  Family History  Problem Relation Age of Onset  . Diabetes Father   . Diabetes Maternal Grandmother   . Heart disease Neg Hx   . Hypertension Neg Hx      Prior to Admission medications   Medication Sig Start Date End Date Taking? Authorizing Provider  insulin aspart (NOVOLOG) 100 UNIT/ML injection Inject 4 Units into the skin 3 (three) times daily before meals. 04/28/14  Yes Drema Dallasurtis J Woods, MD  atorvastatin (LIPITOR) 20 MG tablet Take 1 tablet (20 mg total) by mouth daily at 6 PM. Patient not taking: Reported on 06/30/2014 04/28/14   Drema Dallasurtis  J Woods, MD  hydrochlorothiazide (MICROZIDE) 12.5 MG capsule Take 1 capsule (12.5 mg total) by mouth daily. Patient not taking: Reported on 06/30/2014 04/28/14   Drema Dallasurtis J Woods, MD  insulin aspart protamine- aspart (NOVOLOG MIX 70/30) (70-30) 100 UNIT/ML injection 35 units with breakfast 25 units with dinner Patient not taking: Reported on 06/30/2014 04/28/14   Drema Dallasurtis J Woods, MD  lisinopril (PRINIVIL,ZESTRIL) 5 MG tablet Take 1 tablet (5 mg total) by mouth daily. Patient not taking: Reported on 06/30/2014 04/28/14   Drema Dallasurtis J Woods, MD  TRUEPLUS LANCETS 28G MISC 1 each by Does not apply route 3 (three) times daily. Patient not taking: Reported on 06/30/2014 05/04/14   Lora PaulaJosalyn C Funches, MD   Physical Exam: Filed Vitals:   06/30/14 1933 06/30/14 2011 06/30/14 2118  BP: 131/83 134/75   Pulse: 114 117   Temp: 97.8 F (36.6 C)    TempSrc: Oral    Resp: 18 24   Height:   5\' 6"  (1.676 m)  Weight:   50.122 kg (110 lb 8 oz)  SpO2: 100% 100%     Wt Readings from Last 3 Encounters:  06/30/14 50.122 kg (110 lb 8 oz)  05/01/14 51.71 kg (114 lb)  04/24/14 58.968 kg (130 lb)    General:  Appears calm and comfortable Eyes: PERRL, normal lids, irises & conjunctiva ENT: grossly normal hearing, lips & tongue Neck:  no LAD, masses or thyromegaly Cardiovascular: RRR, no m/r/g. No LE edema. Respiratory: CTA bilaterally, no w/r/r. Normal respiratory effort. Abdomen: soft, ntnd Skin: no rash or induration seen on limited exam Musculoskeletal: grossly normal tone BUE/BLE Psychiatric: grossly normal mood and affect, speech fluent and appropriate Neurologic: grossly non-focal.          Labs on Admission:  Basic Metabolic Panel:  Recent Labs Lab 06/30/14 1939  NA 131*  K 4.1  CL 104  CO2 5*  GLUCOSE 356*  BUN 22  CREATININE 1.52*  CALCIUM 8.3*   Liver Function Tests:  Recent Labs Lab 06/30/14 1939  AST 16  ALT 18  ALKPHOS 76  BILITOT 1.4*  PROT 7.6  ALBUMIN 4.1   No results for  input(s): LIPASE, AMYLASE in the last 168 hours. No results for input(s): AMMONIA in the last 168 hours. CBC:  Recent Labs Lab 06/30/14 1939  WBC 11.8*  NEUTROABS 9.9*  HGB 15.8  HCT 43.7  MCV 85.4  PLT 306   Cardiac Enzymes: No results for input(s): CKTOTAL, CKMB, CKMBINDEX, TROPONINI in the last 168 hours.  BNP (last 3 results) No results for input(s): BNP in the last 8760 hours.  ProBNP (last 3 results) No results for input(s): PROBNP in the last 8760 hours.  CBG: No results for input(s): GLUCAP in the last 168 hours.  Radiological Exams on Admission: Dg Chest Portable 1 View  06/30/2014   CLINICAL DATA:  Hyperglycemia for 1 day. No chest complaints. Nonsmoker. History of hypertension, diabetes, renal insufficiency.  EXAM: PORTABLE CHEST - 1 VIEW  COMPARISON:  04/24/2014  FINDINGS: The heart size and mediastinal contours are within normal limits. Both lungs are clear. The visualized skeletal structures are unremarkable.  IMPRESSION: No active disease.   Electronically Signed   By: Burman Nieves M.D.   On: 06/30/2014 20:33      Assessment/Plan Active Problems:   Metabolic encephalopathy   Renal failure, acute   Diabetic ketoacidosis without coma associated with type 1 diabetes mellitus   Acidosis due to secondary diabetes   DKA (diabetic ketoacidoses)   1. DKA -will be started on IV insulin -aggressive hydration with NS now -monitor FSBS -follow labs including glucose, potassium and bicarb  2. Acute renal Failure -dehydrated -will hydrate with IV NS now -monitor labs  3. Encephalopathy -related to DKA     Code Status: Full Code (must indicate code status--if unknown or must be presumed, indicate so) DVT Prophylaxis:Heparin Family Communication: None (indicate person spoken with, if applicable, with phone number if by telephone) Disposition Plan: Home (indicate anticipated LOS)  Time spent:  [facility] Pager  [phone]

## 2014-07-01 DIAGNOSIS — E1310 Other specified diabetes mellitus with ketoacidosis without coma: Secondary | ICD-10-CM

## 2014-07-01 LAB — BASIC METABOLIC PANEL
ANION GAP: 11 (ref 5–15)
Anion gap: 10 (ref 5–15)
Anion gap: 10 (ref 5–15)
Anion gap: 7 (ref 5–15)
BUN: 15 mg/dL (ref 6–23)
BUN: 13 mg/dL (ref 6–23)
BUN: 14 mg/dL (ref 6–23)
BUN: 13 mg/dL (ref 6–23)
CALCIUM: 7.6 mg/dL — AB (ref 8.4–10.5)
CO2: 11 mmol/L — ABNORMAL LOW (ref 19–32)
CO2: 13 mmol/L — AB (ref 19–32)
CO2: 14 mmol/L — ABNORMAL LOW (ref 19–32)
CO2: 18 mmol/L — AB (ref 19–32)
Calcium: 7.5 mg/dL — ABNORMAL LOW (ref 8.4–10.5)
Calcium: 7.7 mg/dL — ABNORMAL LOW (ref 8.4–10.5)
Calcium: 7.8 mg/dL — ABNORMAL LOW (ref 8.4–10.5)
Chloride: 114 mmol/L — ABNORMAL HIGH (ref 96–112)
Chloride: 114 mmol/L — ABNORMAL HIGH (ref 96–112)
Chloride: 111 mmol/L (ref 96–112)
Chloride: 107 mmol/L (ref 96–112)
Creatinine, Ser: 1.22 mg/dL (ref 0.50–1.35)
Creatinine, Ser: 0.85 mg/dL (ref 0.50–1.35)
Creatinine, Ser: 0.89 mg/dL (ref 0.50–1.35)
Creatinine, Ser: 0.92 mg/dL (ref 0.50–1.35)
GFR calc Af Amer: >90 mL/min (ref >90)
GFR calc Af Amer: >90 mL/min (ref >90)
GFR calc Af Amer: >90 mL/min (ref >90)
GFR calc non Af Amer: >90 mL/min (ref >90)
GFR, EST NON AFRICAN AMERICAN: 78 mL/min — AB (ref >90)
GLUCOSE: 101 mg/dL — AB (ref 70–99)
GLUCOSE: 120 mg/dL — AB (ref 70–99)
GLUCOSE: 256 mg/dL — AB (ref 70–99)
Glucose, Bld: 207 mg/dL — ABNORMAL HIGH (ref 70–99)
POTASSIUM: 3.7 mmol/L (ref 3.5–5.1)
Potassium: 2.8 mmol/L — ABNORMAL LOW (ref 3.5–5.1)
Potassium: 2.9 mmol/L — ABNORMAL LOW (ref 3.5–5.1)
Potassium: 2.6 mmol/L — CL (ref 3.5–5.1)
SODIUM: 136 mmol/L (ref 135–145)
SODIUM: 135 mmol/L (ref 135–145)
Sodium: 137 mmol/L (ref 135–145)
Sodium: 132 mmol/L — ABNORMAL LOW (ref 135–145)

## 2014-07-01 LAB — GLUCOSE, CAPILLARY
GLUCOSE-CAPILLARY: 100 mg/dL — AB (ref 70–99)
GLUCOSE-CAPILLARY: 140 mg/dL — AB (ref 70–99)
GLUCOSE-CAPILLARY: 158 mg/dL — AB (ref 70–99)
GLUCOSE-CAPILLARY: 250 mg/dL — AB (ref 70–99)
GLUCOSE-CAPILLARY: 327 mg/dL — AB (ref 70–99)
GLUCOSE-CAPILLARY: 96 mg/dL (ref 70–99)
Glucose-Capillary: 332 mg/dL — ABNORMAL HIGH (ref 70–99)
Glucose-Capillary: 223 mg/dL — ABNORMAL HIGH (ref 70–99)
Glucose-Capillary: 203 mg/dL — ABNORMAL HIGH (ref 70–99)
Glucose-Capillary: 184 mg/dL — ABNORMAL HIGH (ref 70–99)
Glucose-Capillary: 123 mg/dL — ABNORMAL HIGH (ref 70–99)
Glucose-Capillary: 111 mg/dL — ABNORMAL HIGH (ref 70–99)
Glucose-Capillary: 117 mg/dL — ABNORMAL HIGH (ref 70–99)
Glucose-Capillary: 137 mg/dL — ABNORMAL HIGH (ref 70–99)
Glucose-Capillary: 169 mg/dL — ABNORMAL HIGH (ref 70–99)
Glucose-Capillary: 223 mg/dL — ABNORMAL HIGH (ref 70–99)
Glucose-Capillary: 278 mg/dL — ABNORMAL HIGH (ref 70–99)
Glucose-Capillary: 300 mg/dL — ABNORMAL HIGH (ref 70–99)
Glucose-Capillary: 287 mg/dL — ABNORMAL HIGH (ref 70–99)
Glucose-Capillary: 221 mg/dL — ABNORMAL HIGH (ref 70–99)
Glucose-Capillary: 221 mg/dL — ABNORMAL HIGH (ref 70–99)
Glucose-Capillary: 215 mg/dL — ABNORMAL HIGH (ref 70–99)

## 2014-07-01 LAB — MRSA PCR SCREENING: MRSA by PCR: NEGATIVE

## 2014-07-01 MED ORDER — POTASSIUM CHLORIDE CRYS ER 20 MEQ PO TBCR
40.0000 meq | EXTENDED_RELEASE_TABLET | Freq: Three times a day (TID) | ORAL | Status: DC
  Administered 2014-07-01 – 2014-07-02 (×2): 40 meq via ORAL
  Filled 2014-07-01 (×4): qty 2

## 2014-07-01 MED ORDER — POTASSIUM CHLORIDE 10 MEQ/100ML IV SOLN: 10.0000 meq | INTRAVENOUS | Status: DC

## 2014-07-01 MED ORDER — INSULIN ASPART 100 UNIT/ML ~~LOC~~ SOLN: 0.0000 [IU] | Freq: Every day | SUBCUTANEOUS | Status: DC

## 2014-07-01 MED ORDER — SODIUM CHLORIDE 0.9 % IV SOLN
INTRAVENOUS | Status: AC
  Filled 2014-07-01: qty 2.5

## 2014-07-01 MED ORDER — SODIUM CHLORIDE 0.9 % IV SOLN
INTRAVENOUS | Status: DC
  Administered 2014-07-01: via INTRAVENOUS

## 2014-07-01 MED ORDER — SODIUM CHLORIDE 0.9 % IV SOLN
INTRAVENOUS | Status: DC
  Administered 2014-07-01 – 2014-07-02 (×2): via INTRAVENOUS

## 2014-07-01 MED ORDER — POTASSIUM CHLORIDE 10 MEQ/100ML IV SOLN
10.0000 meq | INTRAVENOUS | Status: AC
  Administered 2014-07-01 (×2): 10 meq via INTRAVENOUS
  Filled 2014-07-01: qty 100

## 2014-07-01 MED ORDER — DEXTROSE-NACL 5-0.45 % IV SOLN
INTRAVENOUS | Status: AC
  Administered 2014-07-01: 01:00:00 via INTRAVENOUS

## 2014-07-01 MED ORDER — POTASSIUM CHLORIDE CRYS ER 20 MEQ PO TBCR
40.0000 meq | EXTENDED_RELEASE_TABLET | Freq: Three times a day (TID) | ORAL | Status: DC
  Administered 2014-07-01 (×2): 40 meq via ORAL
  Filled 2014-07-01: qty 2

## 2014-07-01 MED ORDER — INSULIN ASPART PROT & ASPART (70-30 MIX) 100 UNIT/ML ~~LOC~~ SUSP
25.0000 [IU] | Freq: Two times a day (BID) | SUBCUTANEOUS | Status: DC
  Administered 2014-07-02: 25 [IU] via SUBCUTANEOUS
  Filled 2014-07-01: qty 10

## 2014-07-01 MED ORDER — INSULIN ASPART 100 UNIT/ML ~~LOC~~ SOLN: 0.0000 [IU] | Freq: Three times a day (TID) | SUBCUTANEOUS | Status: DC

## 2014-07-01 MED ORDER — HEPARIN SODIUM (PORCINE) 5000 UNIT/ML IJ SOLN
5000.0000 [IU] | Freq: Three times a day (TID) | INTRAMUSCULAR | Status: DC
  Administered 2014-07-01 – 2014-07-02 (×4): 5000 [IU] via SUBCUTANEOUS
  Filled 2014-07-01 (×7): qty 1

## 2014-07-01 MED ORDER — INSULIN NPH (HUMAN) (ISOPHANE) 100 UNIT/ML ~~LOC~~ SUSP
30.0000 [IU] | Freq: Once | SUBCUTANEOUS | Status: AC
  Administered 2014-07-01: 30 [IU] via SUBCUTANEOUS
  Filled 2014-07-01: qty 10

## 2014-07-01 MED ORDER — POTASSIUM CHLORIDE CRYS ER 20 MEQ PO TBCR
40.0000 meq | EXTENDED_RELEASE_TABLET | Freq: Once | ORAL | Status: AC
  Administered 2014-07-01: 40 meq via ORAL
  Filled 2014-07-01: qty 2

## 2014-07-01 MED ORDER — SODIUM CHLORIDE 0.9 % IV SOLN: INTRAVENOUS | Status: DC

## 2014-07-01 MED ORDER — POTASSIUM CHLORIDE 10 MEQ/100ML IV SOLN
INTRAVENOUS | Status: AC
  Filled 2014-07-01: qty 100

## 2014-07-01 NOTE — Progress Notes (Signed)
Shift Event: Paged with question to transition pt off Insulin gtt. Labs reviewed, CBGS normalized, AG closed, however with CO2 11. Will continue Insulin gtt.   Illa LevelSahar Deleah Tison [facility]

## 2014-07-01 NOTE — Progress Notes (Signed)
[place] TEAM 1 - Stepdown/ICU TEAM Progress Note  [name] [name] [id number] DOB: 07-16-1985 DOA: 06/30/2014 PCP: Lora PaulaFUNCHES, JOSALYN C, MD  Admit HPI / Brief Narrative: 29 y.o. male who presented to the ED in DKA. Patient was last admitted in December. He stated he had been taking his medications as prescribed. No fevers noted. No abdominal pain. No diarrhea noted. In the ED his initial CBG was 356. An ABG showed a pH of 7.078.   HPI/Subjective: Pt is resting comfrotably in bed.  He reports that he feels much better.  He denies cp, sob, n/v, or abdom pain.    Assessment/Plan:  Uncontrolled DM Type 1 w/ renal complications and DKA Gap has now closed AND bicarb is 18 - will enter orders to transition off gtt and advance diet    Acute renal failure  crt 1.52 at presentation - baseline is 0.58 - prerenal etiology most likely - crt rapidly improved w/ IVF   HTN BP currently reasonably controlled - follow w/o change for now  Reported hx of noncompliance  Reported hx of substance abuse   Code Status: FULL Family Communication: no family present at time of exam Disposition Plan: SDU - transfer or ever d/c home in AM   Consultants: none  Procedures: none  Antibiotics: none  DVT prophylaxis: SQ heparin   Objective: Blood pressure 130/87, pulse 95, temperature 98 F (36.7 C), temperature source Oral, resp. rate 16, height 5\' 6"  (1.676 m), weight 50.2 kg (110 lb 10.7 oz), SpO2 100 %.  Intake/Output Summary (Last 24 hours) at 07/01/14 1833 Last data filed at 07/01/14 1700  Gross per 24 hour  Intake 3572.08 ml  Output   2875 ml  Net 697.08 ml   Exam: General: No acute respiratory distress Lungs: Clear to auscultation bilaterally without wheezes or crackles Cardiovascular: Regular rate and rhythm without murmur gallop or rub normal S1 and S2 Abdomen: Nontender, nondistended, soft, bowel sounds positive, no rebound, no ascites, no appreciable mass Extremities: No  significant cyanosis, clubbing, or edema bilateral lower extremities  Data Reviewed: Basic Metabolic Panel:  Recent Labs Lab 06/30/14 1939 07/01/14 0339 07/01/14 0931 07/01/14 1135 07/01/14 1611  NA 131* 136 137 135 132*  K 4.1 3.7 2.8* 2.9* 2.6*  CL 104 114* 114* 111 107  CO2 5* 11* 13* 14* 18*  GLUCOSE 356* 207* 101* 120* 256*  BUN 22 15 13 14 13   CREATININE 1.52* 1.22 0.85 0.89 0.92  CALCIUM 8.3* 7.5* 7.6* 7.7* 7.8*    Liver Function Tests:  Recent Labs Lab 06/30/14 1939  AST 16  ALT 18  ALKPHOS 76  BILITOT 1.4*  PROT 7.6  ALBUMIN 4.1   CBC:  Recent Labs Lab 06/30/14 1939  WBC 11.8*  NEUTROABS 9.9*  HGB 15.8  HCT 43.7  MCV 85.4  PLT 306    CBG:  Recent Labs Lab 07/01/14 1416 07/01/14 1534 07/01/14 1617 07/01/14 1702 07/01/14 1800  GLUCAP 223* 300* 278* 287* 221*    Recent Results (from the past 240 hour(s))  MRSA PCR Screening     Status: None   Collection Time: 06/30/14 11:56 PM  Result Value Ref Range Status   MRSA by PCR NEGATIVE NEGATIVE Final    Comment:        The GeneXpert MRSA Assay (FDA approved for NASAL specimens only), is one component of a comprehensive MRSA colonization surveillance program. It is not intended to diagnose MRSA infection nor to guide or monitor treatment for MRSA infections.  Studies:  Recent x-ray studies have been reviewed in detail by the Attending Physician  Scheduled Meds:  Scheduled Meds: . heparin  5,000 Units Subcutaneous 3 times per day  . insulin aspart  0-5 Units Subcutaneous QHS  . [START ON 07/02/2014] insulin aspart  0-9 Units Subcutaneous TID WC  . [START ON 07/02/2014] insulin aspart protamine- aspart  25 Units Subcutaneous BID WC  . insulin NPH Human  30 Units Subcutaneous Once  . potassium chloride  40 mEq Oral TID  . potassium chloride  40 mEq Oral Once    Time spent on care of this patient: 35 mins   Veronnica Hennings T , MD   [facility] Office   [phone] Pager - Text Page per Loretha Stapler as per below:  On-Call/Text Page:      [email or web address]      password [email or web address]  If 7PM-7AM, please contact night-coverage [email or web address] Password [facility] 07/01/2014, 6:33 PM   LOS: 1 day

## 2014-07-01 NOTE — Progress Notes (Signed)
Utilization Review Completed.Dorcas CarrowDowell, Draeden Kellman T2/02/2015

## 2014-07-01 NOTE — Progress Notes (Signed)
INITIAL NUTRITION ASSESSMENT  DOCUMENTATION CODES Per approved criteria  -Underweight   INTERVENTION: -Recommend Glucerna BID providing 220 kcal and 10 g protein when diet advanced -RD to continue to monitor  NUTRITION DIAGNOSIS: Inadequate oral inatke related to inability to eat as evidenced by NPO status.   Goal: PT to meet >/= 90% of estimated needs.  Monitor:  Pt diet advancement, weight trends, labs  Reason for Assessment: MST = 3  29 y.o. male  Admitting Dx: DKA  ASSESSMENT: Pt with poorly controlled Type I DM.  Pt was admitted in December for DKA as well.  CBG's highly elevated on admission, now stabilized.  MD note states he has not had an appetite but he reported to Dietetic Intern his appetite has been normal.  Pt underweight, reports his usual body weight is 115 lbs and last weighted this 4-5 months ago.  4% weight loss (not significant for time frame).   Pt states his appetite had not changed prior to onset of DKA.  Pt has had Glucerna before and he likes it. Spoke with RN, reports his labs are doing better and should advance diet soon, awaiting MD confirmation. Will recommend Glucerna BID when diet advances since he is underweight.   Nutrition Focused Physical Exam:  No signs of fat or muscle depletion    Height: Ht Readings from Last 1 Encounters:  06/30/14 5\' 6"  (1.676 m)    Weight: Wt Readings from Last 1 Encounters:  06/30/14 110 lb 10.7 oz (50.2 kg)    Ideal Body Weight: 142 lbs  % Ideal Body Weight: 77%  Wt Readings from Last 10 Encounters:  06/30/14 110 lb 10.7 oz (50.2 kg)  05/01/14 114 lb (51.71 kg)  04/24/14 130 lb (58.968 kg)  07/07/13 110 lb 7.2 oz (50.1 kg)  03/21/12 126 lb 4.8 oz (57.289 kg)    Usual Body Weight: 115 lbs  % Usual Body Weight: 96%  BMI:  Body mass index is 17.87 kg/(m^2).  Estimated Nutritional Needs: Kcal: 1800-2000 kcal Protein: 65-80 g protein Fluid: >/= 1.8 L day  Skin: WDL  Diet Order: Diet NPO time  specified  EDUCATION NEEDS: -No education needs identified at this time   Intake/Output Summary (Last 24 hours) at 07/01/14 0857 Last data filed at 07/01/14 0814  Gross per 24 hour  Intake 1833.33 ml  Output   1875 ml  Net -41.67 ml    Last BM: PTA   Labs:   Recent Labs Lab 06/30/14 1939 07/01/14 0339  NA 131* 136  K 4.1 3.7  CL 104 114*  CO2 5* 11*  BUN 22 15  CREATININE 1.52* 1.22  CALCIUM 8.3* 7.5*  GLUCOSE 356* 207*    CBG (last 3)   Recent Labs  07/01/14 0548 07/01/14 0651 07/01/14 0814  GLUCAP 158* 123* 111*    Scheduled Meds: . heparin  5,000 Units Subcutaneous 3 times per day    Continuous Infusions: . sodium chloride Stopped (07/01/14 0120)  . dextrose 5 % and 0.45% NaCl 125 mL/hr at 07/01/14 0120  . insulin (NOVOLIN-R) infusion 3.2 mL/hr at 07/01/14 [id number]    Past Medical History  Diagnosis Date  . Diabetes mellitus without complication Dx 2012  . Hypertension Dx 2015  . Renal insufficiency 2011    pt was on dialysis for a short time approx 2011, pt is no longer on dialysis    History reviewed. No pertinent past surgical history.  Magdalen SpatzLauren Claus Silvestro MS Dietetic Intern Pager Number [phone]

## 2014-07-02 DIAGNOSIS — E1010 Type 1 diabetes mellitus with ketoacidosis without coma: Principal | ICD-10-CM

## 2014-07-02 DIAGNOSIS — N179 Acute kidney failure, unspecified: Secondary | ICD-10-CM

## 2014-07-02 DIAGNOSIS — I10 Essential (primary) hypertension: Secondary | ICD-10-CM | POA: Diagnosis present

## 2014-07-02 DIAGNOSIS — E785 Hyperlipidemia, unspecified: Secondary | ICD-10-CM

## 2014-07-02 DIAGNOSIS — G9341 Metabolic encephalopathy: Secondary | ICD-10-CM

## 2014-07-02 LAB — BASIC METABOLIC PANEL
Anion gap: 4 — ABNORMAL LOW (ref 5–15)
BUN: 9 mg/dL (ref 6–23)
CALCIUM: 8 mg/dL — AB (ref 8.4–10.5)
CHLORIDE: 112 mmol/L (ref 96–112)
CO2: 21 mmol/L (ref 19–32)
Creatinine, Ser: 0.75 mg/dL (ref 0.50–1.35)
GFR calc Af Amer: >90 mL/min (ref >90)
GFR calc non Af Amer: >90 mL/min (ref >90)
GLUCOSE: 69 mg/dL — AB (ref 70–99)
Potassium: 3.4 mmol/L — ABNORMAL LOW (ref 3.5–5.1)
SODIUM: 137 mmol/L (ref 135–145)

## 2014-07-02 LAB — GLUCOSE, CAPILLARY
GLUCOSE-CAPILLARY: 124 mg/dL — AB (ref 70–99)
GLUCOSE-CAPILLARY: 152 mg/dL — AB (ref 70–99)

## 2014-07-02 MED ORDER — INSULIN ASPART PROT & ASPART (70-30 MIX) 100 UNIT/ML ~~LOC~~ SUSP: SUBCUTANEOUS | Status: DC

## 2014-07-02 MED ORDER — ATORVASTATIN CALCIUM 20 MG PO TABS: 40.0000 mg | ORAL_TABLET | Freq: Every day | ORAL | Status: DC

## 2014-07-02 MED ORDER — "NEEDLE (DISP) 22G X 1-1/2"" MISC": 5.0000 [IU] | Freq: Three times a day (TID) | Status: DC

## 2014-07-02 MED ORDER — INSULIN ASPART 100 UNIT/ML ~~LOC~~ SOLN: 0.0000 [IU] | Freq: Three times a day (TID) | SUBCUTANEOUS | Status: DC

## 2014-07-02 MED ORDER — SYRINGE (DISPOSABLE) 1 ML MISC: 5.0000 [IU] | Freq: Three times a day (TID) | Status: DC

## 2014-07-02 NOTE — Discharge Summary (Signed)
Physician Discharge Summary  [name] [name] ZOX:[id number] DOB: 01-Oct-1985 DOA: 06/30/2014  PCP: [name], MD  Admit date: 06/30/2014 Discharge date: 07/02/2014  Time spent: 40 minutes  Recommendations for Outpatient Follow-up:  DKA/Diabetes type 1 uncontrolled -04/24/14 hemoglobin A1c= 11.6 -Resolved, also patient on sequela of continuing to not control his diabetes to include death. -Continue NovoLog 70/30   28 units BID -Continue NovoLog SSI CBG 70-120; 0 units CBG 121-150; 1 unit CBG 151-200; 2 units CBG 201-250; 3 units CBG 251-300; 5 units CBG 301-350; 7 units CBG 351-400; 9 units -Patient to maintain low book containing all fingerstick blood sugar readings, and all food consumed. Take this log book to every appointment with his PCP -PCP to monitor and make adjustments to insulin regimen  Acute renal Failure -Resolved, counseled patient that continued poor control his diabetes would result in permanent kidney failure, increased risk for MI, vision loss, impotence, and loss of limbs. -Follow-up with PCP  Encephalopathy -Resolved   HTN -Currently BP controlled without medication; PCP to monitor and start on appropriate   HLD -Increase Lipitor to 40 mg daily -PCP to monitor   Discharge Diagnoses:  Active Problems:   Metabolic encephalopathy   Renal failure, acute   Diabetic ketoacidosis without coma associated with type 1 diabetes mellitus   Acidosis due to secondary diabetes   DKA (diabetic ketoacidoses)   Essential hypertension   Discharge Condition: Stable  Diet recommendation: Diabetic diet  [facility] Weights   06/30/14 2118 06/30/14 2355  Weight: 50.122 kg (110 lb 8 oz) 50.2 kg (110 lb 10.7 oz)    History of present illness:  [name] [name] is a 29 y.o. BM PMHx type 1 diabetes uncontrolled, HTN, HLD, renal insufficiency (on HD for short time)  Presented with DKA. Patient was last admitted in December. He states that he has not had any appetite. He  states that he has been having some vomiting. He denies cough or congestion. He states that he has no urinary problems. He states that he has been taking his medications as prescribed. No fevers noted. He has no headaches noted. No abdominal pain. No diarrhea noted. He states he has no visual problems. Patient came into the ED and his initial was 356. He had an ABG done and this shows a pH of 7.078 but it is noted to be a venous gas. He states he does not smoke or drink. During hospitalization patient was treated with DKA protocol and has resolved.   Procedures: None   Consultations: None  Antibiotics None   Discharge Exam: Filed Vitals:   07/01/14 1700 07/01/14 2034 07/02/14 0100 07/02/14 0317  BP: 130/87 123/79 107/74 127/87  Pulse: 95 108 96 104  Temp: 98 F (36.7 C) 98.6 F (37 C) 98.2 F (36.8 C) 98.4 F (36.9 C)  TempSrc: Oral Oral Oral Oral  Resp: Height:      Weight:      SpO2: 100% 97% 97% 97%    General: A/O 4, NAD Cardiovascular: Regular rhythm and rate, negative murmurs rubs or gallops, normal S1/S2 Respiratory: Clear to auscultation bilateral  Discharge Instructions     Medication List    STOP taking these medications        hydrochlorothiazide 12.5 MG capsule  Commonly known as:  MICROZIDE     lisinopril 5 MG tablet  Commonly known as:  PRINIVIL,ZESTRIL      TAKE these medications        atorvastatin 20 MG  tablet  Commonly known as:  LIPITOR  Take 2 tablets (40 mg total) by mouth daily at 6 PM.     insulin aspart 100 UNIT/ML injection  Commonly known as:  novoLOG  Inject 0-9 Units into the skin 3 (three) times daily with meals.     insulin aspart protamine- aspart (70-30) 100 UNIT/ML injection  Commonly known as:  NOVOLOG MIX 70/30  - 28 units with breakfast  - 28 units with dinner     NEEDLE (DISP) 22 G 22G X 1-1/2" Misc  Commonly known as:  BD DISP NEEDLES  5 Units by Does not apply route 4 (four) times daily -  with  meals and at bedtime.     Syringe (Disposable) 1 ML Misc  5 Units by Does not apply route 4 (four) times daily -  with meals and at bedtime.     TRUEPLUS LANCETS 28G Misc  1 each by Does not apply route 3 (three) times daily.       No Known Allergies Follow-up Information    Follow up with Lora PaulaFUNCHES, JOSALYN C, MD. Schedule an appointment as soon as possible for a visit in 1 week.   Specialty:  Family Medicine   Why:  Follow-up hospitalization; DKA, noncompliance   Contact information:   [address] [place] [address] [phone]        The results of significant diagnostics from this hospitalization (including imaging, microbiology, ancillary and laboratory) are listed below for reference.    Significant Diagnostic Studies: Dg Chest Portable 1 View  06/30/2014   CLINICAL DATA:  Hyperglycemia for 1 day. No chest complaints. Nonsmoker. History of hypertension, diabetes, renal insufficiency.  EXAM: PORTABLE CHEST - 1 VIEW  COMPARISON:  04/24/2014  FINDINGS: The heart size and mediastinal contours are within normal limits. Both lungs are clear. The visualized skeletal structures are unremarkable.  IMPRESSION: No active disease.   Electronically Signed   By: Burman NievesWilliam  Stevens M.D.   On: 06/30/2014 20:33    Microbiology: Recent Results (from the past 240 hour(s))  MRSA PCR Screening     Status: None   Collection Time: 06/30/14 11:56 PM  Result Value Ref Range Status   MRSA by PCR NEGATIVE NEGATIVE Final    Comment:        The GeneXpert MRSA Assay (FDA approved for NASAL specimens only), is one component of a comprehensive MRSA colonization surveillance program. It is not intended to diagnose MRSA infection nor to guide or monitor treatment for MRSA infections.      Labs: Basic Metabolic Panel:  Recent Labs Lab 07/01/14 0339 07/01/14 0931 07/01/14 1135 07/01/14 1611 07/02/14 0023  NA 136 137 135 132* 137  K 3.7 2.8* 2.9* 2.6* 3.4*  CL 114* 114* 111 107 112   CO2 11* 13* 14* 18* 21  GLUCOSE 207* 101* 120* 256* 69*  BUN 15 13 14 13 9   CREATININE 1.22 0.85 0.89 0.92 0.75  CALCIUM 7.5* 7.6* 7.7* 7.8* 8.0*   Liver Function Tests:  Recent Labs Lab 06/30/14 1939  AST 16  ALT 18  ALKPHOS 76  BILITOT 1.4*  PROT 7.6  ALBUMIN 4.1   No results for input(s): LIPASE, AMYLASE in the last 168 hours. No results for input(s): AMMONIA in the last 168 hours. CBC:  Recent Labs Lab 06/30/14 1939  WBC 11.8*  NEUTROABS 9.9*  HGB 15.8  HCT 43.7  MCV 85.4  PLT 306   Cardiac Enzymes: No results for input(s): CKTOTAL, CKMB, CKMBINDEX,  TROPONINI in the last 168 hours. BNP: BNP (last 3 results) No results for input(s): BNP in the last 8760 hours.  ProBNP (last 3 results) No results for input(s): PROBNP in the last 8760 hours.  CBG:  Recent Labs Lab 07/01/14 1800 07/01/14 1854 07/01/14 2008 07/01/14 2123 07/02/14 0321  GLUCAP 221* 221* 215* 140* 124*       Signed:  Carolyne Littles, MD [facility] [phone] pager

## 2014-07-03 ENCOUNTER — Telehealth: Payer: Self-pay

## 2014-07-03 NOTE — Telephone Encounter (Signed)
Post-discharge Follow-Up Phone Call:  Date of Discharge: 07/02/14 Principal Discharge Diagnosis: DKA/Diabetes type 1 uncontrolled, acute renal failure, encephalopathy, hyperlipidemia   Please check all that apply: X  Patient is knowledgeable of his/her condition(s) and/or treatment. ? Family and/or caregiver is knowledgeable of patient's condition(s) and/or treatment. X  Patient is caring for self at home. ? Patient is receiving home health services. If so, name of agency.    Medication Reconciliation:  X  Medication list reviewed with patient. X  Patient has not obtained all discharge medications. Patient indicates he has Novolog and Novolog 70/30; however, he indicates he has not picked up Lipitor 40 mg daily.  Informed patient of importance of medication and [facility].  Patient verbalized understanding.           Patient Education: Patient indicates he is being compliant with diabetes management.  He indicates he has been checking his blood glucose at least 3-4x/day and is administering Novolog 70/30 28 units with breakfast and dinner. He indicates he is also administering sliding scale insulin with meals. He indicates he is checking his blood glucose more frequently after he has an elevated reading.  Patient has not been keeping a log of his blood glucose readings, but encouraged him to keep a log of all blood glucose readings as well as a food log and to bring it to his follow-up appointment for Dr. Armen PickupFunches to review. Discussed need for follow-up appointment with Dr. Armen PickupFunches and appointment scheduled for 07/13/14 at 1615. Patient verbalized understanding and appreciative of call.          Questions/Concerns discussed:

## 2014-07-03 NOTE — Telephone Encounter (Signed)
Addendum to Post discharge follow-up phone call 1730-Patient Education: Patient indicates he is being compliant with diabetes management. Patient indicates his blood glucose is "better controlled-averaging 200." He is aware that his blood glucose is not at goal, but heindicates he has been checking his blood glucose at least 3-4x/day and is administering Novolog 70/30 28 units with breakfast and dinner. He indicates he is also administering sliding scale insulin with meals.  He indicates he is checking his blood glucose more frequently after he has an elevated reading. Patient has not been keeping a log of his blood glucose readings, but encouraged him to keep a log of all blood glucose readings as well as a food log and to bring it to his follow-up appointment for Dr. Armen PickupFunches to review. Discussed need for follow-up appointment with Dr. Armen PickupFunches and appointment scheduled for 07/13/14 at 1615. Patient verbalized understanding and appreciative of call.

## 2014-07-05 ENCOUNTER — Encounter (HOSPITAL_COMMUNITY): Payer: Self-pay | Admitting: Emergency Medicine

## 2014-07-05 ENCOUNTER — Emergency Department (HOSPITAL_COMMUNITY): Payer: Self-pay

## 2014-07-05 ENCOUNTER — Inpatient Hospital Stay (HOSPITAL_COMMUNITY)
Admission: EM | Admit: 2014-07-05 | Discharge: 2014-07-08 | DRG: 637 | Disposition: A | Discharge status: Home / Self Care | Payer: Self-pay | Attending: Internal Medicine | Admitting: Internal Medicine

## 2014-07-05 DIAGNOSIS — E43 Unspecified severe protein-calorie malnutrition: Secondary | ICD-10-CM | POA: Insufficient documentation

## 2014-07-05 DIAGNOSIS — Z87891 Personal history of nicotine dependence: Secondary | ICD-10-CM

## 2014-07-05 DIAGNOSIS — E1010 Type 1 diabetes mellitus with ketoacidosis without coma: Principal | ICD-10-CM | POA: Diagnosis present

## 2014-07-05 DIAGNOSIS — E860 Dehydration: Secondary | ICD-10-CM | POA: Diagnosis present

## 2014-07-05 DIAGNOSIS — Z79899 Other long term (current) drug therapy: Secondary | ICD-10-CM

## 2014-07-05 DIAGNOSIS — E871 Hypo-osmolality and hyponatremia: Secondary | ICD-10-CM | POA: Diagnosis present

## 2014-07-05 DIAGNOSIS — N179 Acute kidney failure, unspecified: Secondary | ICD-10-CM | POA: Diagnosis present

## 2014-07-05 DIAGNOSIS — E1110 Type 2 diabetes mellitus with ketoacidosis without coma: Secondary | ICD-10-CM | POA: Diagnosis present

## 2014-07-05 DIAGNOSIS — E876 Hypokalemia: Secondary | ICD-10-CM | POA: Diagnosis present

## 2014-07-05 DIAGNOSIS — Z833 Family history of diabetes mellitus: Secondary | ICD-10-CM

## 2014-07-05 DIAGNOSIS — E785 Hyperlipidemia, unspecified: Secondary | ICD-10-CM | POA: Diagnosis present

## 2014-07-05 DIAGNOSIS — I10 Essential (primary) hypertension: Secondary | ICD-10-CM | POA: Diagnosis present

## 2014-07-05 LAB — BLOOD GAS, ARTERIAL
ACID-BASE DEFICIT: 29.1 mmol/L — AB (ref 0.0–2.0)
BICARBONATE: 1.6 meq/L — AB (ref 20.0–24.0)
Drawn by: 36529
O2 Content: 5 L/min
O2 SAT: 97.4 %
Patient temperature: 98.6
TCO2: 1.8 mmol/L (ref 0–100)
pCO2 arterial: 8.6 mmHg — CL (ref 35.0–45.0)
pH, Arterial: 6.894 — CL (ref 7.350–7.450)
pO2, Arterial: 170 mmHg — ABNORMAL HIGH (ref 80.0–100.0)

## 2014-07-05 LAB — URINALYSIS, ROUTINE W REFLEX MICROSCOPIC
Bilirubin Urine: NEGATIVE
Glucose, UA: >1000 mg/dL — AB
Ketones, ur: >80 mg/dL — AB
Leukocytes, UA: NEGATIVE
Nitrite: NEGATIVE
PH: 5 (ref 5.0–8.0)
PROTEIN: 30 mg/dL — AB
Specific Gravity, Urine: 1.022 (ref 1.005–1.030)
Urobilinogen, UA: 0.2 mg/dL (ref 0.0–1.0)

## 2014-07-05 LAB — GLUCOSE, CAPILLARY
GLUCOSE-CAPILLARY: 255 mg/dL — AB (ref 70–99)
GLUCOSE-CAPILLARY: 101 mg/dL — AB (ref 70–99)
GLUCOSE-CAPILLARY: 111 mg/dL — AB (ref 70–99)
Glucose-Capillary: 296 mg/dL — ABNORMAL HIGH (ref 70–99)
Glucose-Capillary: 184 mg/dL — ABNORMAL HIGH (ref 70–99)
Glucose-Capillary: 133 mg/dL — ABNORMAL HIGH (ref 70–99)
Glucose-Capillary: 126 mg/dL — ABNORMAL HIGH (ref 70–99)
Glucose-Capillary: 116 mg/dL — ABNORMAL HIGH (ref 70–99)
Glucose-Capillary: 99 mg/dL (ref 70–99)

## 2014-07-05 LAB — CBC WITH DIFFERENTIAL/PLATELET
BASOS ABS: 0 10*3/uL (ref 0.0–0.1)
BASOS PCT: 0 % (ref 0–1)
EOS PCT: 0 % (ref 0–5)
Eosinophils Absolute: 0 10*3/uL (ref 0.0–0.7)
HCT: 42.9 % (ref 39.0–52.0)
HEMOGLOBIN: 14.8 g/dL (ref 13.0–17.0)
LYMPHS ABS: 2.5 10*3/uL (ref 0.7–4.0)
Lymphocytes Relative: 16 % (ref 12–46)
MCH: 29.9 pg (ref 26.0–34.0)
MCHC: 34.5 g/dL (ref 30.0–36.0)
MCV: 86.7 fL (ref 78.0–100.0)
MONO ABS: 0.9 10*3/uL (ref 0.1–1.0)
Monocytes Relative: 6 % (ref 3–12)
Neutro Abs: 12.4 10*3/uL — ABNORMAL HIGH (ref 1.7–7.7)
Neutrophils Relative %: 78 % — ABNORMAL HIGH (ref 43–77)
Platelets: 370 10*3/uL (ref 150–400)
RBC: 4.95 MIL/uL (ref 4.22–5.81)
RDW: 12.7 % (ref 11.5–15.5)
WBC MORPHOLOGY: INCREASED
WBC: 15.8 10*3/uL — ABNORMAL HIGH (ref 4.0–10.5)

## 2014-07-05 LAB — CBG MONITORING, ED
GLUCOSE-CAPILLARY: 438 mg/dL — AB (ref 70–99)
Glucose-Capillary: 592 mg/dL (ref 70–99)
Glucose-Capillary: 524 mg/dL — ABNORMAL HIGH (ref 70–99)
Glucose-Capillary: 498 mg/dL — ABNORMAL HIGH (ref 70–99)
Glucose-Capillary: 389 mg/dL — ABNORMAL HIGH (ref 70–99)

## 2014-07-05 LAB — BASIC METABOLIC PANEL
Anion gap: 14 (ref 5–15)
Anion gap: 9 (ref 5–15)
BUN: 15 mg/dL (ref 6–23)
BUN: 12 mg/dL (ref 6–23)
CHLORIDE: 114 mmol/L — AB (ref 96–112)
CO2: 6 mmol/L — AB (ref 19–32)
CO2: 12 mmol/L — ABNORMAL LOW (ref 19–32)
Calcium: 7.1 mg/dL — ABNORMAL LOW (ref 8.4–10.5)
Calcium: 7.2 mg/dL — ABNORMAL LOW (ref 8.4–10.5)
Chloride: 112 mmol/L (ref 96–112)
Creatinine, Ser: 1.21 mg/dL (ref 0.50–1.35)
Creatinine, Ser: 0.93 mg/dL (ref 0.50–1.35)
GFR calc Af Amer: >90 mL/min (ref >90)
GFR calc Af Amer: >90 mL/min (ref >90)
GFR calc non Af Amer: >90 mL/min (ref >90)
GFR, EST NON AFRICAN AMERICAN: 79 mL/min — AB (ref >90)
GLUCOSE: 137 mg/dL — AB (ref 70–99)
GLUCOSE: 107 mg/dL — AB (ref 70–99)
Potassium: 3.2 mmol/L — ABNORMAL LOW (ref 3.5–5.1)
Potassium: 3.1 mmol/L — ABNORMAL LOW (ref 3.5–5.1)
SODIUM: 134 mmol/L — AB (ref 135–145)
Sodium: 133 mmol/L — ABNORMAL LOW (ref 135–145)

## 2014-07-05 LAB — TROPONIN I: Troponin I: <0.03 ng/mL (ref <0.031)

## 2014-07-05 LAB — RAPID URINE DRUG SCREEN, HOSP PERFORMED
Amphetamines: NOT DETECTED
BARBITURATES: NOT DETECTED
BENZODIAZEPINES: NOT DETECTED
Cocaine: NOT DETECTED
Opiates: NOT DETECTED
Tetrahydrocannabinol: NOT DETECTED

## 2014-07-05 LAB — COMPREHENSIVE METABOLIC PANEL
ALK PHOS: 101 U/L (ref 39–117)
ALT: 21 U/L (ref 0–53)
AST: 29 U/L (ref 0–37)
Albumin: 3.7 g/dL (ref 3.5–5.2)
BUN: 22 mg/dL (ref 6–23)
CALCIUM: 8.6 mg/dL (ref 8.4–10.5)
CHLORIDE: 95 mmol/L — AB (ref 96–112)
CO2: <5 mmol/L — CL (ref 19–32)
CREATININE: 1.73 mg/dL — AB (ref 0.50–1.35)
GFR, EST AFRICAN AMERICAN: 59 mL/min — AB (ref >90)
GFR, EST NON AFRICAN AMERICAN: 51 mL/min — AB (ref >90)
Glucose, Bld: 653 mg/dL (ref 70–99)
Potassium: 4.9 mmol/L (ref 3.5–5.1)
Sodium: 125 mmol/L — ABNORMAL LOW (ref 135–145)
Total Bilirubin: 1.3 mg/dL — ABNORMAL HIGH (ref 0.3–1.2)
Total Protein: 7.5 g/dL (ref 6.0–8.3)

## 2014-07-05 LAB — KETONES, QUALITATIVE

## 2014-07-05 LAB — LACTIC ACID, PLASMA
LACTIC ACID, VENOUS: 5 mmol/L — AB (ref 0.5–2.0)
Lactic Acid, Venous: 1.8 mmol/L (ref 0.5–2.0)

## 2014-07-05 LAB — URINE MICROSCOPIC-ADD ON

## 2014-07-05 LAB — I-STAT CG4 LACTIC ACID, ED: Lactic Acid, Venous: 4.9 mmol/L (ref 0.5–2.0)

## 2014-07-05 LAB — MAGNESIUM: Magnesium: 1.9 mg/dL (ref 1.5–2.5)

## 2014-07-05 LAB — PHOSPHORUS: Phosphorus: 2.1 mg/dL — ABNORMAL LOW (ref 2.3–4.6)

## 2014-07-05 MED ORDER — INSULIN ASPART 100 UNIT/ML ~~LOC~~ SOLN: 0.0000 [IU] | Freq: Every day | SUBCUTANEOUS | Status: DC

## 2014-07-05 MED ORDER — SUCCINYLCHOLINE CHLORIDE 20 MG/ML IJ SOLN
INTRAMUSCULAR | Status: AC
  Filled 2014-07-05: qty 1

## 2014-07-05 MED ORDER — DEXTROSE-NACL 5-0.45 % IV SOLN
INTRAVENOUS | Status: DC
  Administered 2014-07-05: 75 mL/h via INTRAVENOUS

## 2014-07-05 MED ORDER — LIDOCAINE HCL (CARDIAC) 20 MG/ML IV SOLN
INTRAVENOUS | Status: AC
  Filled 2014-07-05: qty 5

## 2014-07-05 MED ORDER — POTASSIUM CHLORIDE CRYS ER 20 MEQ PO TBCR
40.0000 meq | EXTENDED_RELEASE_TABLET | Freq: Once | ORAL | Status: AC
  Administered 2014-07-05: 40 meq via ORAL
  Filled 2014-07-05: qty 2

## 2014-07-05 MED ORDER — ETOMIDATE 2 MG/ML IV SOLN
INTRAVENOUS | Status: DC
Start: 2014-07-05 — End: 2014-07-05
  Filled 2014-07-05: qty 20

## 2014-07-05 MED ORDER — SODIUM CHLORIDE 0.9 % IV SOLN: INTRAVENOUS | Status: DC

## 2014-07-05 MED ORDER — SODIUM CHLORIDE 0.9 % IV SOLN
INTRAVENOUS | Status: DC
  Administered 2014-07-05: 08:00:00 via INTRAVENOUS
  Filled 2014-07-05 (×2): qty 2.5

## 2014-07-05 MED ORDER — ROCURONIUM BROMIDE 50 MG/5ML IV SOLN
INTRAVENOUS | Status: AC
  Filled 2014-07-05: qty 2

## 2014-07-05 MED ORDER — WHITE PETROLATUM GEL
Status: DC | PRN
  Administered 2014-07-05: 0.2 via TOPICAL
  Filled 2014-07-05: qty 28.35

## 2014-07-05 MED ORDER — HEPARIN SODIUM (PORCINE) 5000 UNIT/ML IJ SOLN
5000.0000 [IU] | Freq: Three times a day (TID) | INTRAMUSCULAR | Status: DC
  Administered 2014-07-05 – 2014-07-07 (×7): 5000 [IU] via SUBCUTANEOUS
  Filled 2014-07-05 (×11): qty 1

## 2014-07-05 MED ORDER — SODIUM BICARBONATE 8.4 % IV SOLN
50.0000 meq | Freq: Once | INTRAVENOUS | Status: DC
  Filled 2014-07-05: qty 50

## 2014-07-05 MED ORDER — SODIUM CHLORIDE 0.9 % IV SOLN
INTRAVENOUS | Status: DC
  Administered 2014-07-05: 500 mL via INTRAVENOUS

## 2014-07-05 MED ORDER — SODIUM CHLORIDE 0.9 % IV BOLUS (SEPSIS)
1000.0000 mL | Freq: Once | INTRAVENOUS | Status: AC
  Administered 2014-07-05: 1000 mL via INTRAVENOUS

## 2014-07-05 MED ORDER — INSULIN ASPART 100 UNIT/ML ~~LOC~~ SOLN: 2.0000 [IU] | SUBCUTANEOUS | Status: DC

## 2014-07-05 NOTE — ED Notes (Signed)
Pt moved to rm D31,

## 2014-07-05 NOTE — ED Notes (Signed)
Venous blood gas and lactic acid results given to Dr. Loretha StaplerWofford

## 2014-07-05 NOTE — Progress Notes (Signed)
CRITICAL VALUE ALERT  Critical value received:  CO2 6  Date of notification:  07/05/2014  Time of notification:  1930  Critical value read back:Yes.    Nurse who received alert:  Tammy SoursMandy Asenath Balash RN  MD notified (1st page):  Dr Dema SeverinMungal  Time of first page:  1950  MD notified (2nd page):  Time of second page:  Responding MD:  Dr Dema SeverinMungal  Time MD responded:  2000

## 2014-07-05 NOTE — ED Notes (Signed)
Abnormal labs given to Dr. Loretha StaplerWofford.

## 2014-07-05 NOTE — ED Notes (Signed)
Critical Care Dr notified of pt's condition

## 2014-07-05 NOTE — ED Notes (Signed)
Pt. presents with EMS from home found by family member with rapid respirations and nausea , elevated CBG = > 500 at home , recently admitted here last 06/30/2014 for DKA .

## 2014-07-05 NOTE — ED Notes (Signed)
Pt more responsive, watching tv, able to answer simple questions.

## 2014-07-05 NOTE — ED Notes (Signed)
CBG 438 

## 2014-07-05 NOTE — Progress Notes (Signed)
CRITICAL VALUE ALERT  Critical value received:  CO2 <5  Date of notification:  07/05/2014  Time of notification:  1535  Critical value read back: Yes  Nurse who received alert:  Domenica Failebekah Kaitlyne Friedhoff, RN  MD notified (1st page):  Dr. Dema SeverinMungal  Time of first page:  1540  MD notified (2nd page):  Time of second page:  Responding MD:  Dr. Dema SeverinMungal  Time MD responded:  1556

## 2014-07-05 NOTE — H&P (Signed)
PULMONARY / CRITICAL CARE MEDICINE   Name: [name] [name] MRN: [id number] DOB: October 21, 1985    ADMISSION DATE:  07/05/2014  REFERRING MD :  EDP  CHIEF COMPLAINT:  DKA  INITIAL PRESENTATION:  29yo male with hx DM, HTN just d/c 2/11 after an admission for DKA and acute renal failure. Returned 2/14 after being found unresponsive by family.  In ER remained altered with CBG >600, tachycardic and acidotic with initial pH 6.7.  PCCM called to admit.   STUDIES:    SIGNIFICANT EVENTS:    HISTORY OF PRESENT ILLNESS:  29yo male with hx DM, HTN just d/c 2/11 after an admission for DKA and acute renal failure. Returned 2/14 after being found unresponsive by family.  In ER remained altered with CBG >600, tachycardic and acidotic with initial pH 6.7.  PCCM called to admit.   PAST MEDICAL HISTORY :   has a past medical history of Diabetes mellitus without complication (Dx 2012); Hypertension (Dx 2015); and Renal insufficiency (2011).  has no past surgical history on file. Prior to Admission medications   Medication Sig Start Date End Date Taking? Authorizing Provider  atorvastatin (LIPITOR) 20 MG tablet Take 2 tablets (40 mg total) by mouth daily at 6 PM. 07/02/14  Yes Drema Dallas, MD  insulin aspart (NOVOLOG) 100 UNIT/ML injection Inject 0-9 Units into the skin 3 (three) times daily with meals. 07/02/14  Yes Drema Dallas, MD  insulin aspart protamine- aspart (NOVOLOG MIX 70/30) (70-30) 100 UNIT/ML injection 28 units with breakfast 28 units with dinner 07/02/14  Yes Drema Dallas, MD  NEEDLE, DISP, 22 G (BD DISP NEEDLES) 22G X 1-1/2" MISC 5 Units by Does not apply route 4 (four) times daily -  with meals and at bedtime. 07/02/14   Drema Dallas, MD  Syringe, Disposable, 1 ML MISC 5 Units by Does not apply route 4 (four) times daily -  with meals and at bedtime. 07/02/14   Drema Dallas, MD  TRUEPLUS LANCETS 28G MISC 1 each by Does not apply route 3 (three) times daily. Patient not taking: Reported  on 06/30/2014 05/04/14   [name], MD   No Known Allergies  FAMILY HISTORY:  indicated that his mother is alive. He indicated that his father is deceased.  SOCIAL HISTORY:  reports that he quit smoking about 9 months ago. He has never used smokeless tobacco. He reports that he does not drink alcohol or use illicit drugs.  REVIEW OF SYSTEMS:  Unable, pt confused.  As per HPI obtained from records and EDP.   SUBJECTIVE:   VITAL SIGNS: Temp:  [90.1 F (32.3 C)-93.6 F (34.2 C)] 93.6 F (34.2 C) (02/14 1010) Pulse Rate:  [77-180] 117 (02/14 1010) Resp:  [19-28] 22 (02/14 1010) BP: (140-160)/(84-111) 142/93 mmHg (02/14 0958) SpO2:  [95 %-100 %] 100 % (02/14 1010) Weight:  [110 lb (49.896 kg)] 110 lb (49.896 kg) (02/14 0817) HEMODYNAMICS:   VENTILATOR SETTINGS:   INTAKE / OUTPUT:  Intake/Output Summary (Last 24 hours) at 07/05/14 1047 Last data filed at 07/05/14 1004  Gross per 24 hour  Intake   3000 ml  Output    800 ml  Net   2200 ml    PHYSICAL EXAMINATION: General:  Thin male, acutely ill appearing  Neuro:  Awake, confused, answers questions but inappropriately HEENT:  Mm dry, no JVD Cardiovascular:  s1s2 tachy 150's Lungs:  resps even non labored on [place], diminished bases  Abdomen:  Soft, non tender  Musculoskeletal:  Warm and dry, no edema    LABS:  CBC  Recent Labs Lab 06/30/14 1939 07/05/14 0743  WBC 11.8* 15.8*  HGB 15.8 14.8  HCT 43.7 42.9  PLT 306 370   Coag's No results for input(s): APTT, INR in the last 168 hours. BMET  Recent Labs Lab 07/01/14 1611 07/02/14 0023 07/05/14 0743  NA 132* 137 125*  K 2.6* 3.4* 4.9  CL 107 112 95*  CO2 18* 21 <5*  BUN 13 9 22   CREATININE 0.92 0.75 1.73*  GLUCOSE 256* 69* 653*   Electrolytes  Recent Labs Lab 07/01/14 1611 07/02/14 0023 07/05/14 0743  CALCIUM 7.8* 8.0* 8.6   Sepsis Markers  Recent Labs Lab 07/05/14 0759 07/05/14 0954  LATICACIDVEN 5.0* 4.90*   ABG No results for  input(s): PHART, PCO2ART, PO2ART in the last 168 hours. Liver Enzymes  Recent Labs Lab 06/30/14 1939 07/05/14 0743  AST 16 29  ALT 18 21  ALKPHOS 76 101  BILITOT 1.4* 1.3*  ALBUMIN 4.1 3.7   Cardiac Enzymes No results for input(s): TROPONINI, PROBNP in the last 168 hours. Glucose  Recent Labs Lab 07/02/14 0321 07/02/14 0900 07/05/14 0711 07/05/14 0810 07/05/14 0913 07/05/14 1020  GLUCAP 124* 152* 592* 524* 498* 438*    Imaging No results found.   ASSESSMENT / PLAN:  ENDOCRINE DM  Severe DKA - ?noncomplaince.  Just d/c 2/11  P:   Admit ICU  Insulin gtt per protocol  NPO  hgba1c  DM education  F/u ABG   PULMONARY Tachypnea - respiratory compensation for severe metabolic acidosis  P:   Monitor ABG  Monitor RR  Supplemental O2 as needed F/u CXR    CARDIOVASCULAR Tachycardia - improving  HTN  P:  Volume  Trend troponin  Monitor HR  Hold statin    RENAL Acute on chronic renal insufficiency  Hyponatremia  DKA  Anion gap acidosis  P:   F/u ABG - may need HCO3  Volume resuscitation  Insulin gtt per protocol  F/u chem  Repeat lactate    GASTROINTESTINAL No active issue  P:   PPI  NPO   HEMATOLOGIC No active issue  P:  F/u cbc  Sub Q heparin  INFECTIOUS No known source infection  P:   Ua 2/14>>>   Monitor wbc, fever curve off abx  ?infection as catalyst for recurrent DKA    NEUROLOGIC AMS - improving.  In setting DKA  P:   Supportive care  Avoid oversedation    FAMILY  - Updates: No family available 2/14  - Inter-disciplinary family meet or Palliative Care meeting due by:  2/21    Dirk DressKaty Whiteheart, NP 07/05/2014  10:47 AM Pager: [phone] or [phone]  ATTENDING NOTE: I have personally reviewed patient's available data, including medical history, events of note, physical examination and test results as part of my evaluation. I have discussed with resident/NP and other careteam providers such as pharmacist,  RN and RRT & co-ordinated with consultants. In addition, I personally evaluated patient and elicited key history of type I diabetic for 4 years, recent discharge on 2/11, readmitted with DKA-was only taking NovoLog at home exam findings of generalized weakness, hyperventilation: Admit-rapidly improved and able to talk in full sentences within a few hours & labs showing high anion gap, severe acidosis with pH 6.9 and acute kidney injury with creatinine 1.7.  Fluids and insulin drip per DKA protocol , serial chemistry -when anion gap closes , can give her subcutaneous  Lantus and transition off insulin drip  Will need diabetes education  Rest per NP/medical resident whose note is outlined above and that I agree with and edited in full.    Care during the described time interval was provided by me and/or other providers on the critical care team.  I have reviewed this patient's available data, including medical history, events of note, physical examination and test results as part of my evaluation  CC time x 28m  Oretha Milch. MD

## 2014-07-05 NOTE — Progress Notes (Signed)
UR Completed.  [phone]  

## 2014-07-05 NOTE — ED Notes (Signed)
Matt HolmesBaer hugger removed, core temp=97. Report given to nurse on 2S.

## 2014-07-05 NOTE — Care Management Note (Signed)
    Page 1 of 1   07/07/2014     12:02:42 PM CARE MANAGEMENT NOTE 07/07/2014  Patient:  [name] MastMCGEE,[name]   Account Number:  [id number]  Date Initiated:  07/05/2014  Documentation initiated by:  Avie ArenasBROWN,Elisandro Jarrett  Subjective/Objective Assessment:   Readmitted with DKA     Action/Plan:   Anticipated DC Date:  07/07/2014   Anticipated DC Plan:  HOME/SELF CARE      DC Planning Services  CM consult      Choice offered to / List presented to:             Status of service:  In process, will continue to follow Medicare Important Message given?   (If response is "NO", the following Medicare IM given date fields will be blank) Date Medicare IM given:   Medicare IM given by:   Date Additional Medicare IM given:   Additional Medicare IM given by:    Discharge Disposition:    Per UR Regulation:  Reviewed for med. necessity/level of care/duration of stay  If discussed at Long Length of Stay Meetings, dates discussed:    Comments:  07-07-14 11:50am Avie ArenasSarah Cyndee Giammarco, RNBSN [phone]- [phone] Patient awake, lives at home with mother.  Is able to tell me what he has to do with 70/30 Insulin and knows importance of taking.  Talked about a resource he could call with questions and they could call him to check in. Thought that this would be a great idea.  [facility] for follow up.  Was not sure about applying for lantus insulin through clinic.  He does understand that he has an appt tomorrow with specific instructions on discharge sheet to seek a specific person out at this clinic.  07-06-14 10:30am Avie ArenasSarah Seleena Reimers, RNBSN - [phone] [facility] with specific insutruction on dc sheet.  Patient did not know how the 70-30 worked and that he needed to take it on regular schedule - admits to not taking.  Diabetes Education consult sent for continued education.  07-05-14 4:10pm Avie ArenasSarah Osei Anger, RNBSN [phone]- [phone] Patient has been admitted 3 times since the first part of Dec  2015.  Used the [employer]ALCOM[employer] program in December.  [facility] and was seen in there clinic.  Missed the second appt, but did call. Had phone conversation with [facility] post discharge 2-12 and had planned appt for 2-22.  In process of trying to get that appt moved up to this week. CM will continue to follow.

## 2014-07-05 NOTE — ED Notes (Signed)
The patient is unable to give an urine specimen at this time. The tech has reported to the RN in charge. 

## 2014-07-05 NOTE — ED Provider Notes (Signed)
CSN: [id number]     Arrival date & time 07/05/14  4098 History   First MD Initiated Contact with Patient 07/05/14 [phone]     Chief Complaint  Patient presents with  . Hyperglycemia     (Consider location/radiation/quality/duration/timing/severity/associated sxs/prior Treatment) HPI Comments: 29 yo male who was discharged from the hospital two days ago due DKA who presents with critically high blood sugars.  History limited by acuity of condition and increased respiratory rate.  Level V Caveat applies.   Patient is a 29 y.o. male presenting with hyperglycemia.  Hyperglycemia Blood sugar level PTA:  "High" Severity:  Severe Onset quality:  Gradual Progression:  Worsening Chronicity:  Recurrent Diabetes status:  Controlled with insulin Relieved by:  Nothing Ineffective treatments:  None tried Associated symptoms comment:  Severe thirst   Past Medical History  Diagnosis Date  . Diabetes mellitus without complication Dx 2012  . Hypertension Dx 2015  . Renal insufficiency 2011    pt was on dialysis for a short time approx 2011, pt is no longer on dialysis   History reviewed. No pertinent past surgical history. Family History  Problem Relation Age of Onset  . Diabetes Father   . Diabetes Maternal Grandmother   . Heart disease Neg Hx   . Hypertension Neg Hx    History  Substance Use Topics  . Smoking status: Former Smoker    Quit date: 09/19/2013  . Smokeless tobacco: Never Used  . Alcohol Use: No    Review of Systems  Unable to perform ROS: Acuity of condition      Allergies  Review of patient's allergies indicates no known allergies.  Home Medications   Prior to Admission medications   Medication Sig Start Date End Date Taking? Authorizing Provider  atorvastatin (LIPITOR) 20 MG tablet Take 2 tablets (40 mg total) by mouth daily at 6 PM. 07/02/14  Yes Drema Dallas, MD  insulin aspart (NOVOLOG) 100 UNIT/ML injection Inject 0-9 Units into the skin 3 (three) times  daily with meals. 07/02/14  Yes Drema Dallas, MD  insulin aspart protamine- aspart (NOVOLOG MIX 70/30) (70-30) 100 UNIT/ML injection 28 units with breakfast 28 units with dinner 07/02/14  Yes Drema Dallas, MD  NEEDLE, DISP, 22 G (BD DISP NEEDLES) 22G X 1-1/2" MISC 5 Units by Does not apply route 4 (four) times daily -  with meals and at bedtime. 07/02/14   Drema Dallas, MD  Syringe, Disposable, 1 ML MISC 5 Units by Does not apply route 4 (four) times daily -  with meals and at bedtime. 07/02/14   Drema Dallas, MD  TRUEPLUS LANCETS 28G MISC 1 each by Does not apply route 3 (three) times daily. Patient not taking: Reported on 06/30/2014 05/04/14   [name], MD   There were no vitals taken for this visit. Physical Exam  Constitutional: He is oriented to person, place, and time. He appears well-developed and well-nourished. He appears distressed.  HENT:  Head: Normocephalic and atraumatic.  Mouth/Throat: Mucous membranes are dry.  Eyes: Conjunctivae are normal. Pupils are equal, round, and reactive to light. No scleral icterus.  Neck: Neck supple.  Cardiovascular: Regular rhythm, normal heart sounds and intact distal pulses.  Tachycardia present.   No murmur heard. Pulmonary/Chest: Breath sounds normal. No stridor. Tachypnea noted. He is in respiratory distress. He has no wheezes. He has no rales.  Abdominal: Soft. He exhibits no distension. There is no tenderness.  Musculoskeletal: Normal range of motion. He exhibits  no edema.  Neurological: He is alert and oriented to person, place, and time.  Skin: Skin is warm and dry. No rash noted.  Psychiatric: He has a normal mood and affect. His behavior is normal.  Nursing note and vitals reviewed.   ED Course  CRITICAL CARE Performed by: Orson GearWOFFORD III, Jamison Soward DAVID Authorized by: Orson GearWOFFORD III, Hoover Grewe DAVID Total critical care time: 50 minutes Critical care time was exclusive of separately billable procedures and treating other  patients. Critical care was necessary to treat or prevent imminent or life-threatening deterioration of the following conditions: metabolic crisis. Critical care was time spent personally by me on the following activities: development of treatment plan with patient or surrogate, discussions with consultants, evaluation of patient's response to treatment, examination of patient, obtaining history from patient or surrogate, ordering and performing treatments and interventions, ordering and review of laboratory studies, ordering and review of radiographic studies, pulse oximetry, re-evaluation of patient's condition and review of old charts.   (including critical care time) Labs Review Labs Reviewed  CBC WITH DIFFERENTIAL/PLATELET - Abnormal; Notable for the following:    WBC 15.8 (*)    Neutrophils Relative % 78 (*)    Neutro Abs 12.4 (*)    All other components within normal limits  COMPREHENSIVE METABOLIC PANEL - Abnormal; Notable for the following:    Sodium 125 (*)    Chloride 95 (*)    CO2 <5 (*)    Glucose, Bld 653 (*)    Creatinine, Ser 1.73 (*)    Total Bilirubin 1.3 (*)    GFR calc non Af Amer 51 (*)    GFR calc Af Amer 59 (*)    All other components within normal limits  LACTIC ACID, PLASMA - Abnormal; Notable for the following:    Lactic Acid, Venous 5.0 (*)    All other components within normal limits  KETONES, QUALITATIVE - Abnormal; Notable for the following:    Acetone, Bld MODERATE (*)    All other components within normal limits  URINALYSIS, ROUTINE W REFLEX MICROSCOPIC - Abnormal; Notable for the following:    APPearance CLOUDY (*)    Glucose, UA >1000 (*)    Hgb urine dipstick TRACE (*)    Ketones, ur >80 (*)    Protein, ur 30 (*)    All other components within normal limits  URINE MICROSCOPIC-ADD ON - Abnormal; Notable for the following:    Bacteria, UA MANY (*)    Casts GRANULAR CAST (*)    All other components within normal limits  BLOOD GAS, ARTERIAL -  Abnormal; Notable for the following:    pH, Arterial 6.894 (*)    pCO2 arterial 8.6 (*)    pO2, Arterial 170.0 (*)    Bicarbonate 1.6 (*)    Acid-base deficit 29.1 (*)    All other components within normal limits  CBG MONITORING, ED - Abnormal; Notable for the following:    Glucose-Capillary 592 (*)    All other components within normal limits  CBG MONITORING, ED - Abnormal; Notable for the following:    Glucose-Capillary 524 (*)    All other components within normal limits  CBG MONITORING, ED - Abnormal; Notable for the following:    Glucose-Capillary 498 (*)    All other components within normal limits  I-STAT CG4 LACTIC ACID, ED - Abnormal; Notable for the following:    Lactic Acid, Venous 4.90 (*)    All other components within normal limits  CBG MONITORING, ED - Abnormal; Notable  for the following:    Glucose-Capillary 438 (*)    All other components within normal limits  CBG MONITORING, ED - Abnormal; Notable for the following:    Glucose-Capillary 389 (*)    All other components within normal limits  URINE RAPID DRUG SCREEN (HOSP PERFORMED)  LACTIC ACID, PLASMA  BLOOD GAS, VENOUS  BASIC METABOLIC PANEL  BASIC METABOLIC PANEL  BASIC METABOLIC PANEL  BASIC METABOLIC PANEL  MAGNESIUM  PHOSPHORUS  TROPONIN I  TROPONIN I  TROPONIN I  HEMOGLOBIN A1C  BASIC METABOLIC PANEL  I-STAT CG4 LACTIC ACID, ED    Imaging Review Dg Chest Portable 1 View  07/05/2014   CLINICAL DATA:  Acute altered level consciousness and hyperglycemia. Initial encounter.  EXAM: PORTABLE CHEST - 1 VIEW  COMPARISON:  06/30/2014 and prior radiographs dating back to 03/10/2012.  FINDINGS: The cardiomediastinal silhouette is unremarkable.  Lungs are clear.  There is no evidence of focal airspace disease, pulmonary edema, suspicious pulmonary nodule/mass, pleural effusion, or pneumothorax. No acute bony abnormalities are identified.  IMPRESSION: No active disease.   Electronically Signed   By: Harmon Pier  M.D.   On: 07/05/2014 10:31  All radiology studies independently viewed by me.      EKG Interpretation None      MDM   Final diagnoses:  Diabetic ketoacidosis without coma associated with type 1 diabetes mellitus    Recent admission for DKA, now here with hyperglycemia and kussmaul breathing.  Appears severely dehydrated. Aggressive treatment with IV fluids. IV insulin, close monitoring.   Initial pH 6.7.  Pt initially appeared to worsen despite treatment.  Bicarb was considered due to his profound acidosis.  However, he appeared to turn a corner, becoming more alert and becoming less tachycardic.  Therefore, treatment was continued and he was admitted to the ICU.    Candyce Churn III, MD 07/05/14 [phone]

## 2014-07-05 NOTE — ED Notes (Signed)
Pt allowed ice chips per Samara DeistKathryn, NP.

## 2014-07-05 NOTE — ED Notes (Signed)
Critical care at bedside  

## 2014-07-05 NOTE — Care Management (Signed)
Patient was discharged from the hospital two days ago due DKA who presents with critically high blood sugars. Patient has had 3 inpatient admissions for hyperglycemia with DKA. Spoke with patient, reviewed his insulin regimen, he reports only taking Novolog insulin but, not taking the Novolin 70/30 insulin. Patient appears to have  limited understanding and insight relating to his condition.  Discussed diabetic education reinforcement, patient is agreeable. Consult will be placed for diabetic education this admission.  Will also contact CM Peterson LombardJessica Beck [facility] to follow up with patient. Unit CM will continue to follow up for final disposition plan.

## 2014-07-06 DIAGNOSIS — E43 Unspecified severe protein-calorie malnutrition: Secondary | ICD-10-CM | POA: Insufficient documentation

## 2014-07-06 LAB — BASIC METABOLIC PANEL
ANION GAP: 3 — AB (ref 5–15)
Anion gap: 7 (ref 5–15)
Anion gap: 4 — ABNORMAL LOW (ref 5–15)
BUN: 10 mg/dL (ref 6–23)
BUN: 6 mg/dL (ref 6–23)
CALCIUM: 7.2 mg/dL — AB (ref 8.4–10.5)
CALCIUM: 7.7 mg/dL — AB (ref 8.4–10.5)
CHLORIDE: 112 mmol/L (ref 96–112)
CHLORIDE: 108 mmol/L (ref 96–112)
CO2: 13 mmol/L — ABNORMAL LOW (ref 19–32)
CO2: 19 mmol/L (ref 19–32)
CO2: 21 mmol/L (ref 19–32)
CREATININE: 0.84 mg/dL (ref 0.50–1.35)
Calcium: 7.7 mg/dL — ABNORMAL LOW (ref 8.4–10.5)
Chloride: 112 mmol/L (ref 96–112)
Creatinine, Ser: 0.98 mg/dL (ref 0.50–1.35)
Creatinine, Ser: 0.75 mg/dL (ref 0.50–1.35)
GFR calc Af Amer: >90 mL/min (ref >90)
GFR calc Af Amer: >90 mL/min (ref >90)
GFR calc Af Amer: >90 mL/min (ref >90)
GFR calc non Af Amer: >90 mL/min (ref >90)
GFR calc non Af Amer: >90 mL/min (ref >90)
GFR calc non Af Amer: >90 mL/min (ref >90)
GLUCOSE: 206 mg/dL — AB (ref 70–99)
Glucose, Bld: 104 mg/dL — ABNORMAL HIGH (ref 70–99)
Glucose, Bld: 104 mg/dL — ABNORMAL HIGH (ref 70–99)
POTASSIUM: 2.8 mmol/L — AB (ref 3.5–5.1)
POTASSIUM: 3.3 mmol/L — AB (ref 3.5–5.1)
Potassium: 3.1 mmol/L — ABNORMAL LOW (ref 3.5–5.1)
Sodium: 132 mmol/L — ABNORMAL LOW (ref 135–145)
Sodium: 134 mmol/L — ABNORMAL LOW (ref 135–145)
Sodium: 133 mmol/L — ABNORMAL LOW (ref 135–145)

## 2014-07-06 LAB — GLUCOSE, CAPILLARY
GLUCOSE-CAPILLARY: 112 mg/dL — AB (ref 70–99)
GLUCOSE-CAPILLARY: 112 mg/dL — AB (ref 70–99)
GLUCOSE-CAPILLARY: 115 mg/dL — AB (ref 70–99)
GLUCOSE-CAPILLARY: 123 mg/dL — AB (ref 70–99)
GLUCOSE-CAPILLARY: 152 mg/dL — AB (ref 70–99)
GLUCOSE-CAPILLARY: 156 mg/dL — AB (ref 70–99)
GLUCOSE-CAPILLARY: 180 mg/dL — AB (ref 70–99)
GLUCOSE-CAPILLARY: 73 mg/dL (ref 70–99)
Glucose-Capillary: 104 mg/dL — ABNORMAL HIGH (ref 70–99)
Glucose-Capillary: 106 mg/dL — ABNORMAL HIGH (ref 70–99)
Glucose-Capillary: 112 mg/dL — ABNORMAL HIGH (ref 70–99)
Glucose-Capillary: 95 mg/dL (ref 70–99)
Glucose-Capillary: 100 mg/dL — ABNORMAL HIGH (ref 70–99)
Glucose-Capillary: 112 mg/dL — ABNORMAL HIGH (ref 70–99)
Glucose-Capillary: 117 mg/dL — ABNORMAL HIGH (ref 70–99)
Glucose-Capillary: 120 mg/dL — ABNORMAL HIGH (ref 70–99)
Glucose-Capillary: 147 mg/dL — ABNORMAL HIGH (ref 70–99)
Glucose-Capillary: 148 mg/dL — ABNORMAL HIGH (ref 70–99)
Glucose-Capillary: 158 mg/dL — ABNORMAL HIGH (ref 70–99)
Glucose-Capillary: 191 mg/dL — ABNORMAL HIGH (ref 70–99)
Glucose-Capillary: 249 mg/dL — ABNORMAL HIGH (ref 70–99)
Glucose-Capillary: 70 mg/dL (ref 70–99)

## 2014-07-06 LAB — MAGNESIUM
MAGNESIUM: 2.3 mg/dL (ref 1.5–2.5)
Magnesium: 2 mg/dL (ref 1.5–2.5)

## 2014-07-06 LAB — HEMOGLOBIN A1C
Hgb A1c MFr Bld: 12.5 % — ABNORMAL HIGH (ref 4.8–5.6)
Mean Plasma Glucose: 312 mg/dL

## 2014-07-06 LAB — TROPONIN I
Troponin I: <0.03 ng/mL (ref <0.031)
Troponin I: <0.03 ng/mL (ref <0.031)

## 2014-07-06 LAB — PHOSPHORUS
Phosphorus: 1.5 mg/dL — ABNORMAL LOW (ref 2.3–4.6)
Phosphorus: <1 mg/dL — CL (ref 2.3–4.6)

## 2014-07-06 MED ORDER — POTASSIUM CHLORIDE CRYS ER 20 MEQ PO TBCR
40.0000 meq | EXTENDED_RELEASE_TABLET | ORAL | Status: AC
  Administered 2014-07-06 (×2): 40 meq via ORAL
  Filled 2014-07-06 (×2): qty 2

## 2014-07-06 MED ORDER — INSULIN GLARGINE 100 UNIT/ML ~~LOC~~ SOLN
20.0000 [IU] | Freq: Every day | SUBCUTANEOUS | Status: DC
Start: 2014-07-06 — End: 2014-07-06

## 2014-07-06 MED ORDER — POTASSIUM CHLORIDE CRYS ER 20 MEQ PO TBCR
40.0000 meq | EXTENDED_RELEASE_TABLET | Freq: Once | ORAL | Status: AC
  Administered 2014-07-06: 40 meq via ORAL
  Filled 2014-07-06: qty 2

## 2014-07-06 MED ORDER — GLUCERNA SHAKE PO LIQD
237.0000 mL | Freq: Three times a day (TID) | ORAL | Status: DC
  Administered 2014-07-07 – 2014-07-08 (×4): 237 mL via ORAL

## 2014-07-06 MED ORDER — SODIUM PHOSPHATE 3 MMOLE/ML IV SOLN
30.0000 mmol | Freq: Once | INTRAVENOUS | Status: AC
  Administered 2014-07-06: 30 mmol via INTRAVENOUS
  Filled 2014-07-06: qty 10

## 2014-07-06 MED ORDER — INSULIN ASPART 100 UNIT/ML ~~LOC~~ SOLN
0.0000 [IU] | Freq: Three times a day (TID) | SUBCUTANEOUS | Status: DC
  Administered 2014-07-07: 8 [IU] via SUBCUTANEOUS
  Administered 2014-07-07: 3 [IU] via SUBCUTANEOUS
  Administered 2014-07-07: 5 [IU] via SUBCUTANEOUS
  Administered 2014-07-08: 15 [IU] via SUBCUTANEOUS

## 2014-07-06 MED ORDER — INSULIN GLARGINE 100 UNIT/ML ~~LOC~~ SOLN
20.0000 [IU] | Freq: Every day | SUBCUTANEOUS | Status: DC
  Administered 2014-07-06 – 2014-07-08 (×3): 20 [IU] via SUBCUTANEOUS
  Filled 2014-07-06 (×3): qty 0.2

## 2014-07-06 MED ORDER — DEXTROSE-NACL 5-0.9 % IV SOLN
INTRAVENOUS | Status: DC
  Administered 2014-07-06: 10:00:00 via INTRAVENOUS

## 2014-07-06 MED ORDER — INSULIN ASPART 100 UNIT/ML ~~LOC~~ SOLN: 0.0000 [IU] | Freq: Every day | SUBCUTANEOUS | Status: DC

## 2014-07-06 MED ORDER — POTASSIUM PHOSPHATES 15 MMOLE/5ML IV SOLN
10.0000 mmol | Freq: Once | INTRAVENOUS | Status: AC
  Administered 2014-07-06: 10 mmol via INTRAVENOUS
  Filled 2014-07-06: qty 3.33

## 2014-07-06 MED ORDER — POTASSIUM CHLORIDE 10 MEQ/100ML IV SOLN
10.0000 meq | INTRAVENOUS | Status: AC
  Administered 2014-07-06 (×3): 10 meq via INTRAVENOUS
  Filled 2014-07-06 (×3): qty 100

## 2014-07-06 MED ORDER — MAGNESIUM SULFATE 2 GM/50ML IV SOLN
2.0000 g | Freq: Once | INTRAVENOUS | Status: AC
  Administered 2014-07-06: 2 g via INTRAVENOUS
  Filled 2014-07-06: qty 50

## 2014-07-06 MED ORDER — INSULIN ASPART 100 UNIT/ML ~~LOC~~ SOLN
4.0000 [IU] | Freq: Three times a day (TID) | SUBCUTANEOUS | Status: DC
  Administered 2014-07-06 – 2014-07-08 (×5): 4 [IU] via SUBCUTANEOUS

## 2014-07-06 NOTE — Progress Notes (Signed)
INITIAL NUTRITION ASSESSMENT  DOCUMENTATION CODES Per approved criteria  -Severe malnutrition in the context of chronic illness   INTERVENTION: Glucerna Shake po TID, each supplement provides 220 kcal and 10 grams of protein  NUTRITION DIAGNOSIS: Malnutrition related to chronic illness as evidenced by severe fat and muscle depletion.   Goal: Pt to meet >/= 90% of their estimated nutrition needs   Monitor:  PO intake, weight trends, labs  Reason for Assessment: Pt identified as at nutrition risk on the Malnutrition Screen Tool  29 y.o. male  Admitting Dx: DKA  ASSESSMENT: Pt with hx of type 1 DM with multiple admissions for DKA. Pt has difficulty getting appropriate insulin regimen at home. Diabetes coordinator consulted.   Per pt he has a great appetite but continues to lose weight due to high blood sugar. Pt non-compliant with medication.  Potassium and Phosphorus low receiving replacement.   Nutrition Focused Physical Exam:  Subcutaneous Fat:  Orbital Region: WDL Upper Arm Region: WDl Thoracic and Lumbar Region: severe depletion  Muscle:  Temple Region: mild depletion Clavicle Bone Region: severe depletion Clavicle and Acromion Bone Region: severe depletion Scapular Bone Region: WDL Dorsal Hand: WDL Patellar Region: WDL Anterior Thigh Region: WDL Posterior Calf Region: WDL  Edema: not present   Height: Ht Readings from Last 1 Encounters:  07/05/14 5\' 6"  (1.676 m)    Weight: Wt Readings from Last 1 Encounters:  07/05/14 110 lb (49.896 kg)    Ideal Body Weight: 142 lb   % Ideal Body Weight: 77%  Wt Readings from Last 10 Encounters:  07/05/14 110 lb (49.896 kg)  06/30/14 110 lb 10.7 oz (50.2 kg)  05/01/14 114 lb (51.71 kg)  04/24/14 130 lb (58.968 kg)  07/07/13 110 lb 7.2 oz (50.1 kg)  03/21/12 126 lb 4.8 oz (57.289 kg)    Usual Body Weight: 115 lb   % Usual Body Weight: 96%  BMI:  Body mass index is 17.76 kg/(m^2).  Estimated Nutritional  Needs: Kcal: 1800-2000 kcal Protein: 65-80 g protein Fluid: >/= 1.8 L day  Skin: WDL  Diet Order: Diet Carb Modified  EDUCATION NEEDS: -Education needs addressed   Intake/Output Summary (Last 24 hours) at 07/06/14 1411 Last data filed at 07/06/14 1400  Gross per 24 hour  Intake 2597.73 ml  Output   3375 ml  Net -777.27 ml    Last BM: PTA   Labs:   Recent Labs Lab 07/05/14 1349  07/05/14 2243 07/06/14 0236 07/06/14 1145  NA 134*  < > 133* 132* 134*  K 3.4*  < > 3.1* 3.1* 2.8*  CL 110  < > 112 112 112  CO2 <5*  < > 12* 13* 19  BUN 20  < > 12 10 6   CREATININE 1.40*  < > 0.93 0.98 0.75  CALCIUM 8.0*  < > 7.2* 7.2* 7.7*  MG 1.9  --   --  2.3  --   PHOS 2.1*  --   --  1.5*  --   GLUCOSE 268*  < > 107* 104* 104*  < > = values in this interval not displayed.  CBG (last 3)   Recent Labs  07/06/14 0002 07/06/14 0055 07/06/14 0203  GLUCAP 112* 112* 104*    Scheduled Meds: . heparin  5,000 Units Subcutaneous 3 times per day  . insulin aspart  0-15 Units Subcutaneous TID WC  . insulin aspart  0-5 Units Subcutaneous QHS  . insulin aspart  4 Units Subcutaneous TID WC  . insulin  glargine  20 Units Subcutaneous Daily  . potassium chloride  10 mEq Intravenous Q1 Hr x 3  . potassium chloride  40 mEq Oral Q3H    Continuous Infusions: . dextrose 5 % and 0.9% NaCl 75 mL/hr at 07/06/14 1006  . insulin (NOVOLIN-R) infusion 7.9 mL/hr at 07/06/14 1000    Past Medical History  Diagnosis Date  . Diabetes mellitus without complication Dx 2012  . Hypertension Dx 2015  . Renal insufficiency 2011    pt was on dialysis for a short time approx 2011, pt is no longer on dialysis    History reviewed. No pertinent past surgical history.  Kendell Bane RD, LDN, CNSC [phone] Pager [phone] After Hours Pager

## 2014-07-06 NOTE — Progress Notes (Signed)
    Recent Labs Lab 07/05/14 0743 07/05/14 1349 07/05/14 1825 07/05/14 2243 07/06/14 0236  K 4.9 3.4* 3.2* 3.1* 3.1*   A Low K  P Repeat kcl 40meq via po  Dr. Kalman ShanMurali Seren Chaloux, M.D., [facility] Pulmonary and Critical Care Medicine Staff Physician [facility] Pulmonary and Critical Care Pager: [phone], If no answer or between  15:00h - 7:00h: call [phone]  07/06/2014 3:34 AM

## 2014-07-06 NOTE — Progress Notes (Signed)
PULMONARY / CRITICAL CARE MEDICINE   Name: [name] [name] MRN: [id number] DOB: 1985/07/01    ADMISSION DATE:  07/05/2014  REFERRING MD :  EDP  CHIEF COMPLAINT:  DKA  INITIAL PRESENTATION:  29yo male with hx DM, HTN just d/c 2/11 after an admission for DKA and acute renal failure. Returned 2/14 after being found unresponsive by family.  In ER remained altered with CBG >600, tachycardic and acidotic with initial pH 6.7.  PCCM called to admit.   STUDIES:   SIGNIFICANT EVENTS:  SUBJECTIVE:  No c/o.  K, Phos replaced overnight.  Remains on inulin gtt, gap closing.   VITAL SIGNS: Temp:  [93 F (33.9 C)-100 F (37.8 C)] 99.5 F (37.5 C) (02/15 0900) Pulse Rate:  [103-180] 109 (02/15 0900) Resp:  [12-26] 15 (02/15 0900) BP: (116-146)/(64-93) 137/85 mmHg (02/15 0900) SpO2:  [94 %-100 %] 100 % (02/15 0900) Weight:  [110 lb (49.896 kg)] 110 lb (49.896 kg) (02/14 1300)    INTAKE / OUTPUT:  Intake/Output Summary (Last 24 hours) at 07/06/14 0948 Last data filed at 07/06/14 0900  Gross per 24 hour  Intake 4211.43 ml  Output   3150 ml  Net 1061.43 ml    PHYSICAL EXAMINATION: General:  Thin male, much improved. Neuro:  Awake, alert, appropriate, MAE  HEENT:  Mm dry, no JVD Cardiovascular:  s1s2 tachy but improved Lungs:  resps even non labored on RA, diminished bases  Abdomen:  Soft, non tender  Musculoskeletal:  Warm and dry, no edema    LABS:  CBC  Recent Labs Lab 06/30/14 1939 07/05/14 0743  WBC 11.8* 15.8*  HGB 15.8 14.8  HCT 43.7 42.9  PLT 306 370   BMET  Recent Labs Lab 07/05/14 1825 07/05/14 2243 07/06/14 0236  NA 134* 133* 132*  K 3.2* 3.1* 3.1*  CL 114* 112 112  CO2 6* 12* 13*  BUN 15 12 10   CREATININE 1.21 0.93 0.98  GLUCOSE 137* 107* 104*   Electrolytes  Recent Labs Lab 07/05/14 1349 07/05/14 1825 07/05/14 2243 07/06/14 0236  CALCIUM 8.0* 7.1* 7.2* 7.2*  MG 1.9  --   --  2.3  PHOS 2.1*  --   --  1.5*   Sepsis Markers  Recent  Labs Lab 07/05/14 0759 07/05/14 0954 07/05/14 1349  LATICACIDVEN 5.0* 4.90* 1.8   ABG  Recent Labs Lab 07/05/14 1118  PHART 6.894*  PCO2ART 8.6*  PO2ART 170.0*   Liver Enzymes  Recent Labs Lab 06/30/14 1939 07/05/14 0743  AST 16 29  ALT 18 21  ALKPHOS 76 101  BILITOT 1.4* 1.3*  ALBUMIN 4.1 3.7   Cardiac Enzymes  Recent Labs Lab 07/05/14 1825 07/05/14 2243 07/06/14 0236  TROPONINI <0.03 <0.03 <0.03   Glucose  Recent Labs Lab 07/05/14 2104 07/05/14 2205 07/05/14 2301 07/06/14 0002 07/06/14 0055 07/06/14 0203  GLUCAP 99 95 106* 112* 112* 104*    Imaging Dg Chest Portable 1 View  07/05/2014   CLINICAL DATA:  Acute altered level consciousness and hyperglycemia. Initial encounter.  EXAM: PORTABLE CHEST - 1 VIEW  COMPARISON:  06/30/2014 and prior radiographs dating back to 03/10/2012.  FINDINGS: The cardiomediastinal silhouette is unremarkable.  Lungs are clear.  There is no evidence of focal airspace disease, pulmonary edema, suspicious pulmonary nodule/mass, pleural effusion, or pneumothorax. No acute bony abnormalities are identified.  IMPRESSION: No active disease.   Electronically Signed   By: Harmon PierJeffrey  Hu M.D.   On: 07/05/2014 10:31     ASSESSMENT / PLAN:  DKA 2nd to difficulty with getting appropriate insulin regimen at home. P:   Continue Insulin gtt per protocol until gap closed  Advance diet as tolerated DM education   Hx of HTN, HLD. P:  Monitor hemodynamics Hold lipitor   AKI 2nd to volume depletion. Hypokalemia. Hypophosphatemia. P:   Monitor renal fx, urine outpt F/u and replace electrolytes as needed D/c foley  Acute encephalopathy 2nd to DKA >> much improved. P: Monitor clinically  Dirk Dress, NP 07/06/2014  9:48 AM Pager: [phone] or [phone]  Reviewed above, examined.  Still on insulin gtt.  Denies chest pain, dyspnea, abd pain.  F/u BMET.  Transition off insulin gtt once anion gap closed, and advance  diet.  Monitor and replace electrolytes as needed.  Coralyn Helling, MD [facility] Pulmonary/Critical Care 07/06/2014, 12:12 PM Pager:  [phone] After 3pm call: [phone]

## 2014-07-06 NOTE — Progress Notes (Signed)
eLink Physician-Brief Progress Note Patient Name: Lelon Mastnthony Racz DOB: 06-22-1985 MRN: [id number]   Date of Service  07/06/2014  HPI/Events of Note  NaPO4 < 1    eICU Interventions  rx IV PO4     Intervention Category Major Interventions: Electrolyte abnormality - evaluation and management  Shan Levansatrick Sael Furches 07/06/2014, 8:32 PM

## 2014-07-06 NOTE — Progress Notes (Signed)
Inpatient Diabetes Program Recommendations  AACE/ADA: New Consensus Statement on Inpatient Glycemic Control (2013)  Target Ranges:  Prepandial:   less than 140 mg/dL      Peak postprandial:   less than 180 mg/dL (1-2 hours)      Critically ill patients:  140 - 180 mg/dL   Reason for Visit: Readmissions/DKA  Diabetes history: Type 1 diabetes Outpatient Diabetes medications: Novolin 70/30 28 units bid Current orders for Inpatient glycemic control: Transitioning off insulin drip.  Note that patient has been readmitted.  Spoke to him frankly regarding the potential for poor outcomes related to recurrent DKA.  Patient states that he was taking "insulin" after his last admission but that he never went a picked up medications after discharge on 2/12 because he could not afford him.  States that he had Novolog at home, so he took that.  Explained that Novolog is much different from 70/30 insulin.  Asked if he asked for help from his mom and family and he states "I don't want to inconvenience anyone else".    Discussed the complications and risk of death from diabetes.  He state that his father died of diabetes in 2000.  He also voiced concern that his dad would be disappointed in him.  Discussed with him the potential for a "normal" life if he complies and takes medications as ordered.  Explained the [facility] and the need for him to go to appointments and potentially get medications there.  He states that he recently broke up with his girlfriend and during that relationship, he had no admissions to the hospital and was able to work.  When I asked him if he was feeling down, he states "I'm frustrated".  States he has an 31 year old and a 36 month old daughter that he does not get to see often because he does not have transportation.  Consider "psychiatric evaluation" as he complains of potential symptoms of depression. Unsure what else can be done for patient otherwise.  Reinforced that  medication [facility] and that he will need to ask his mother or family for assistance in purchasing meds.  Explained that he cannot live without insulin. Attempted to motivate patient and encourage him in the care of himself.  Discussed at length with RN.  Will follow up with patient on 2/16.  Thanks, Beryl MeagerJenny Lilliauna Van, RN, BC-ADM Inpatient Diabetes Coordinator Pager [phone]

## 2014-07-06 NOTE — Progress Notes (Signed)
Anion gap closed.  Will give lantus and change to SSI, and then wean off insulin gtt.  K low >> will give IV and PO replacement.  Will advance diet.  Coralyn HellingVineet Kamila Broda, MD [facility] Pulmonary/Critical Care 07/06/2014, 2:02 PM Pager:  [phone] After 3pm call: [phone]

## 2014-07-06 NOTE — Progress Notes (Signed)
  eLink Physician Progress Note and Electrolyte Replacement  Patient Name: Lelon Mastnthony Bence DOB: 08-09-1985 MRN: [id number]  Date of Service  07/06/2014   HPI/Events of Note    Recent Labs Lab 07/02/14 0023 07/05/14 0743 07/05/14 1349 07/05/14 1825 07/05/14 2243  NA 137 125* 134* 134* 133*  K 3.4* 4.9 3.4* 3.2* 3.1*  CL 112 95* 110 114* 112  CO2 21 <5* <5* 6* 12*  GLUCOSE 69* 653* 268* 137* 107*  BUN 9 22 20 15 12   CREATININE 0.75 1.73* 1.40* 1.21 0.93  CALCIUM 8.0* 8.6 8.0* 7.1* 7.2*  MG  --   --  1.9  --   --   PHOS  --   --  2.1*  --   --     Estimated Creatinine Clearance: 82 mL/min (by C-G formula based on Cr of 0.93).  Intake/Output      02/14 0701 - 02/15 0700   P.O. 360   I.V. (mL/kg) 4731.7 (94.8)   Total Intake(mL/kg) 5091.7 (102)   Urine (mL/kg/hr) 2900 (2.4)   Total Output 2900   Net +2191.7        - I/O DETAILED x 24h    Total I/O In: 666.7 [P.O.:360; I.V.:306.7] Out: 650 [Urine:650] - I/O THIS SHIFT    ASSESSMENT Hypokalemia Hypomagnesemia Hypophosphatemia   eICURN Interventions  Replete all 3   ASSESSMENT: MAJOR ELECTROLYTE      Dr. Kalman ShanMurali Evian Salguero, M.D., [facility]P Pulmonary and Critical Care Medicine Staff Physician [facility] [place] Pulmonary and Critical Care Pager: [phone], If no answer or between  15:00h - 7:00h: call [phone]  07/06/2014 12:08 AM

## 2014-07-06 NOTE — Progress Notes (Signed)
CRITICAL VALUE ALERT  Critical value received:  Phosphorus <1.0  Date of notification:  07/06/14  Time of notification:  2030  Critical value read back: yes  Nurse who received alert:  Doylene BodeEmily Ahmarion Saraceno, RN  MD notified (1st page):  Dr. Delford FieldWright  Time of first page:  2032  MD notified (2nd page):  Time of second page:  Responding MD:  Dr. Delford FieldWright  Time MD responded:  Pola CornELINK secretary notified Dr. Delford FieldWright at time of call

## 2014-07-07 DIAGNOSIS — E43 Unspecified severe protein-calorie malnutrition: Secondary | ICD-10-CM

## 2014-07-07 LAB — GLUCOSE, CAPILLARY
GLUCOSE-CAPILLARY: 166 mg/dL — AB (ref 70–99)
GLUCOSE-CAPILLARY: 233 mg/dL — AB (ref 70–99)
GLUCOSE-CAPILLARY: 179 mg/dL — AB (ref 70–99)
Glucose-Capillary: 275 mg/dL — ABNORMAL HIGH (ref 70–99)

## 2014-07-07 LAB — POCT I-STAT 3, ART BLOOD GAS (G3+)
Acid-base deficit: 3 mmol/L — ABNORMAL HIGH (ref 0.0–2.0)
Bicarbonate: 23.3 mEq/L (ref 20.0–24.0)
O2 Saturation: 77 %
PCO2 ART: 47.8 mmHg — AB (ref 35.0–45.0)
PH ART: 7.295 — AB (ref 7.350–7.450)
Patient temperature: 98.6
TCO2: 25 mmol/L (ref 0–100)
pO2, Arterial: 46 mmHg — ABNORMAL LOW (ref 80.0–100.0)

## 2014-07-07 LAB — CBC
HCT: 30.9 % — ABNORMAL LOW (ref 39.0–52.0)
Hemoglobin: 11.3 g/dL — ABNORMAL LOW (ref 13.0–17.0)
MCH: 29.7 pg (ref 26.0–34.0)
MCHC: 36.6 g/dL — AB (ref 30.0–36.0)
MCV: 81.1 fL (ref 78.0–100.0)
Platelets: 171 10*3/uL (ref 150–400)
RBC: 3.81 MIL/uL — AB (ref 4.22–5.81)
RDW: 12.7 % (ref 11.5–15.5)
WBC: 6.3 10*3/uL (ref 4.0–10.5)

## 2014-07-07 LAB — BASIC METABOLIC PANEL
Anion gap: 7 (ref 5–15)
BUN: <5 mg/dL — ABNORMAL LOW (ref 6–23)
CHLORIDE: 108 mmol/L (ref 96–112)
CO2: 18 mmol/L — AB (ref 19–32)
Calcium: 7.7 mg/dL — ABNORMAL LOW (ref 8.4–10.5)
Creatinine, Ser: 0.67 mg/dL (ref 0.50–1.35)
GFR calc Af Amer: >90 mL/min (ref >90)
GFR calc non Af Amer: >90 mL/min (ref >90)
GLUCOSE: 285 mg/dL — AB (ref 70–99)
POTASSIUM: 3.2 mmol/L — AB (ref 3.5–5.1)
SODIUM: 133 mmol/L — AB (ref 135–145)

## 2014-07-07 LAB — MAGNESIUM: Magnesium: 1.9 mg/dL (ref 1.5–2.5)

## 2014-07-07 LAB — PHOSPHORUS: Phosphorus: 2.3 mg/dL (ref 2.3–4.6)

## 2014-07-07 MED ORDER — MAGNESIUM SULFATE 2 GM/50ML IV SOLN
2.0000 g | Freq: Once | INTRAVENOUS | Status: AC
  Administered 2014-07-07: 2 g via INTRAVENOUS
  Filled 2014-07-07: qty 50

## 2014-07-07 MED ORDER — POTASSIUM CHLORIDE CRYS ER 20 MEQ PO TBCR
40.0000 meq | EXTENDED_RELEASE_TABLET | Freq: Once | ORAL | Status: AC
  Administered 2014-07-07: 40 meq via ORAL
  Filled 2014-07-07: qty 2

## 2014-07-07 NOTE — Progress Notes (Signed)
PULMONARY / CRITICAL CARE MEDICINE   Name: [name] [name] MRN: [id number] DOB: May 17, 1986    ADMISSION DATE:  07/05/2014  REFERRING MD :  EDP  CHIEF COMPLAINT:  DKA  INITIAL PRESENTATION:  29yo male with hx DM, HTN just d/c 2/11 after an admission for DKA and acute renal failure. Returned 2/14 after being found unresponsive by family.  In ER remained altered with CBG >600, tachycardic and acidotic with initial pH 6.7.  PCCM called to admit.    SIGNIFICANT EVENTS: 07/06/14:  No c/o.  K, Phos replaced overnight.  Remains on inulin gtt, gap closing.     SUBJECTIVE/OVERNIGHT/INTERVAL HX 07/07/14: OFf insulin drip. On lantus + SSI. Per RN - patient might be depressed. Currently eating and watching TV, denies complaints  VITAL SIGNS: Temp:  [98.4 F (36.9 C)-100 F (37.8 C)] 99.6 F (37.6 C) (02/16 0700) Pulse Rate:  [97-121] 97 (02/16 0800) Resp:  [13-25] 14 (02/16 0800) BP: (116-149)/(72-105) 135/85 mmHg (02/16 0800) SpO2:  [97 %-100 %] 100 % (02/16 0800)    INTAKE / OUTPUT:  Intake/Output Summary (Last 24 hours) at 07/07/14 0915 Last data filed at 07/07/14 0800  Gross per 24 hour  Intake 3244.8 ml  Output   3150 ml  Net   94.8 ml    PHYSICAL EXAMINATION: General:  Thin male, looks well Psych: Flat affect + Neuro:  Awake, alert, appropriate, MAE  HEENT:  Mm dry, no JVD Cardiovascular:  s1s2 tachy but improved Lungs:  resps even non labored on RA, diminished bases  Abdomen:  Soft, non tender  Musculoskeletal:  Warm and dry, no edema    LABS:  PULMONARY  Recent Labs Lab 06/30/14 2020 07/05/14 1118  PHART  --  6.894*  PCO2ART  --  8.6*  PO2ART  --  170.0*  HCO3 6.0* 1.6*  TCO2 7 1.8  O2SAT 54.0 97.4    CBC  Recent Labs Lab 06/30/14 1939 07/05/14 0743 07/07/14 0221  HGB 15.8 14.8 11.3*  HCT 43.7 42.9 30.9*  WBC 11.8* 15.8* 6.3  PLT 306 370 171    COAGULATION No results for input(s): INR in the last 168 hours.  CARDIAC   Recent Labs Lab  07/05/14 1349 07/05/14 1825 07/05/14 2243 07/06/14 0236 07/06/14 1145  TROPONINI <0.03 <0.03 <0.03 <0.03 <0.03   No results for input(s): PROBNP in the last 168 hours.   CHEMISTRY  Recent Labs Lab 07/05/14 1349 07/05/14 1825 07/05/14 2243 07/06/14 0236 07/06/14 1145 07/06/14 1932 07/07/14 0221  NA 134* 134* 133* 132* 134* 133*  --   K 3.4* 3.2* 3.1* 3.1* 2.8* 3.3*  --   CL 110 114* 112 112 112 108  --   CO2 <5* 6* 12* 13* 19 21  --   GLUCOSE 268* 137* 107* 104* 104* 206*  --   BUN <5*  --   CREATININE 1.40* 1.21 0.93 0.98 0.75 0.84  --   CALCIUM 8.0* 7.1* 7.2* 7.2* 7.7* 7.7*  --   MG 1.9  --   --  2.3  --  2.0 1.9  PHOS 2.1*  --   --  1.5*  --  <1.0* 2.3   Estimated Creatinine Clearance: 90.8 mL/min (by C-G formula based on Cr of 0.84).   LIVER  Recent Labs Lab 06/30/14 1939 07/05/14 0743  AST 16 29  ALT 18 21  ALKPHOS 76 101  BILITOT 1.4* 1.3*  PROT 7.6 7.5  ALBUMIN 4.1 3.7     INFECTIOUS  Recent Labs Lab 07/05/14 0759 07/05/14 0954 07/05/14 1349  LATICACIDVEN 5.0* 4.90* 1.8     ENDOCRINE CBG (last 3)   Recent Labs  07/06/14 1621 07/06/14 1718 07/06/14 2126  GLUCAP 115* 73 180*         IMAGING x48h Dg Chest Portable 1 View  07/05/2014   CLINICAL DATA:  Acute altered level consciousness and hyperglycemia. Initial encounter.  EXAM: PORTABLE CHEST - 1 VIEW  COMPARISON:  06/30/2014 and prior radiographs dating back to 03/10/2012.  FINDINGS: The cardiomediastinal silhouette is unremarkable.  Lungs are clear.  There is no evidence of focal airspace disease, pulmonary edema, suspicious pulmonary nodule/mass, pleural effusion, or pneumothorax. No acute bony abnormalities are identified.  IMPRESSION: No active disease.   Electronically Signed   By: Harmon PierJeffrey  Hu M.D.   On: 07/05/2014 10:31        ASSESSMENT / PLAN:  DKA 2nd to difficulty with getting appropriate insulin regimen at home.   - off insulin gtt x 24h. On d5 at  4875ml/h.  No BMET yet today  P:   REPEAT BMET today Reduce d5 to kvo Advance diet as tolerated Lantus + SSI  DM education ongoing   Hx of HTN, HLD. P:  Monitor hemodynamics Hold lipitor - restart 07/08/14   AKI 2nd to volume depletion.   - AKI resolved. BMET pending. Mag still < 2  P:   Replete Mag Monitor renal fx, urine outpt F/u and replace electrolytes as needed D/c foley  Acute encephalopathy 2nd to DKA >> much improved. - > resolved 07/07/14  P: Monitor clinically    GLOBAL Move to med-surg. TRiad PRimary from 07/08/14 andPCCM off - calling Dr Joseph ArtWoods for sign out   Dr. Kalman ShanMurali Machi Whittaker, M.D., [facility].C.P Pulmonary and Critical Care Medicine Staff Physician [facility] Pager: [phone], If no answer or between  15:00h - 7:00h: call [phone]  07/07/2014 9:23 AM

## 2014-07-07 NOTE — Discharge Instructions (Signed)
[facility] will follow you by phone.  If you have questions [phone] office 24/7 - Suezanne CheshireEdwinna Swan [facility] Contact -  They will contact you on discharge.

## 2014-07-07 NOTE — Progress Notes (Signed)
eLink Physician-Brief Progress Note Patient Name: [name] [name] DOB: [birth year] MRN: [id number]   Date of Service  07/07/2014  HPI/Events of Note  hypokalemia  eICU Interventions  kdur 40 meq po x one      Intervention Category Major Interventions: Acid-Base disturbance - evaluation and management;Electrolyte abnormality - evaluation and management  Sandrea HughsMichael Cieanna Stormes 07/07/2014, 10:46 PM

## 2014-07-08 ENCOUNTER — Ambulatory Visit: Payer: Self-pay

## 2014-07-08 DIAGNOSIS — E785 Hyperlipidemia, unspecified: Secondary | ICD-10-CM

## 2014-07-08 DIAGNOSIS — G9341 Metabolic encephalopathy: Secondary | ICD-10-CM

## 2014-07-08 DIAGNOSIS — E0811 Diabetes mellitus due to underlying condition with ketoacidosis with coma: Secondary | ICD-10-CM

## 2014-07-08 DIAGNOSIS — I10 Essential (primary) hypertension: Secondary | ICD-10-CM

## 2014-07-08 LAB — CBC WITH DIFFERENTIAL/PLATELET
BASOS PCT: 0 % (ref 0–1)
Basophils Absolute: 0 10*3/uL (ref 0.0–0.1)
EOS ABS: 0 10*3/uL (ref 0.0–0.7)
EOS PCT: 0 % (ref 0–5)
HCT: 30.8 % — ABNORMAL LOW (ref 39.0–52.0)
HEMOGLOBIN: 11.1 g/dL — AB (ref 13.0–17.0)
Lymphocytes Relative: 36 % (ref 12–46)
Lymphs Abs: 1.2 10*3/uL (ref 0.7–4.0)
MCH: 29.8 pg (ref 26.0–34.0)
MCHC: 36 g/dL (ref 30.0–36.0)
MCV: 82.6 fL (ref 78.0–100.0)
MONOS PCT: 12 % (ref 3–12)
Monocytes Absolute: 0.4 10*3/uL (ref 0.1–1.0)
Neutro Abs: 1.8 10*3/uL (ref 1.7–7.7)
Neutrophils Relative %: 52 % (ref 43–77)
PLATELETS: 150 10*3/uL (ref 150–400)
RBC: 3.73 MIL/uL — AB (ref 4.22–5.81)
RDW: 12.7 % (ref 11.5–15.5)
WBC: 3.4 10*3/uL — ABNORMAL LOW (ref 4.0–10.5)

## 2014-07-08 LAB — MAGNESIUM: MAGNESIUM: 2.1 mg/dL (ref 1.5–2.5)

## 2014-07-08 LAB — BASIC METABOLIC PANEL
Anion gap: 3 — ABNORMAL LOW (ref 5–15)
BUN: 6 mg/dL (ref 6–23)
CALCIUM: 7.9 mg/dL — AB (ref 8.4–10.5)
CO2: 24 mmol/L (ref 19–32)
CREATININE: 0.59 mg/dL (ref 0.50–1.35)
Chloride: 109 mmol/L (ref 96–112)
GFR calc non Af Amer: >90 mL/min (ref >90)
Glucose, Bld: 412 mg/dL — ABNORMAL HIGH (ref 70–99)
Potassium: 3.9 mmol/L (ref 3.5–5.1)
Sodium: 136 mmol/L (ref 135–145)

## 2014-07-08 LAB — GLUCOSE, CAPILLARY
Glucose-Capillary: 369 mg/dL — ABNORMAL HIGH (ref 70–99)
Glucose-Capillary: 258 mg/dL — ABNORMAL HIGH (ref 70–99)

## 2014-07-08 LAB — PHOSPHORUS: PHOSPHORUS: 2.2 mg/dL — AB (ref 2.3–4.6)

## 2014-07-08 NOTE — Progress Notes (Signed)
CARE MANAGEMENT NOTE 07/08/2014  Patient:  [name],[name]   Account Number:  [id number]  Date Initiated:  07/05/2014  Documentation initiated by:  Avie ArenasBROWN,SARAH  Subjective/Objective Assessment:   Readmitted with DKA     Action/Plan:   Anticipated DC Date:  07/07/2014   Anticipated DC Plan:  HOME/SELF CARE      DC Planning Services  CM consult      Choice offered to / List presented to:             Status of service:  Completed, signed off Medicare Important Message given?   (If response is "NO", the following Medicare IM given date fields will be blank) Date Medicare IM given:   Medicare IM given by:   Date Additional Medicare IM given:   Additional Medicare IM given by:    Discharge Disposition:  HOME/SELF CARE  Per UR Regulation:  Reviewed for med. necessity/level of care/duration of stay  If discussed at Long Length of Stay Meetings, dates discussed:    Comments:  07/08/2014 1230 [facility]. Pt states he plans to keep appt today. States he will pick up his medications today. Pt states he has to pick up his nephew this afternoon. Prefers to leave now. Attending made aware. Discussed barriers and he states that he is ust having difficulty with the insulin and how they work in his body. NCM explained the importance of being compliant with taking medications and follow up with his healthcare team to help reduce complications with diabetes. Verbalized an understanding. Pt states he is currently looking for a job. [name] DonningAlesia Adaleena Mooers RN CCM Case Mgmt phone [phone]  07-07-14 11:50am Avie ArenasSarah Brown, RNBSN - [phone] Patient awake, lives at home with mother.  Is able to tell me what he has to do with 70/30 Insulin and knows importance of taking.  Talked about a resource he could call with questions and they could call him to check in. Thought that this would be a great idea.  [facility].  Was not sure about applying for lantus insulin  through clinic.  He does understand that he has an appt tomorrow with specific instructions on discharge sheet to seek a specific person out at this clinic.  07-06-14 10:30am Avie ArenasSarah Brown, RNBSN - [phone] [facility] specific insutruction on dc sheet.  Patient did not know how the 70-30 worked and that he needed to take it on regular schedule - admits to not taking.  Diabetes Education consult sent for continued education.  07-05-14 4:10pm Avie ArenasSarah Brown, RNBSN [phone] Patient has been admitted 3 times since the first part of Dec 2015.  Used the [employer] program in December.  [facility].  Missed the second appt, but did call. [facility] and had planned appt for 2-22.  In process of trying to get that appt moved up to this week. CM will continue to follow.

## 2014-07-08 NOTE — Discharge Summary (Signed)
Physician Discharge Summary  [name] [name] VWU:[id number] DOB: 10-Sep-1985 DOA: 07/05/2014  PCP: Lora PaulaFUNCHES, JOSALYN C, MD  Admit date: 07/05/2014 Discharge date: 07/08/2014  Time spent: 40 minutes  Recommendations for Outpatient Follow-up:  [facility]. [facility] including insulin 2. Follow up with primary care doctor in 1 week  Discharge Diagnoses:  Active Problems:   Diabetic ketoacidosis   Protein-calorie malnutrition, severe Essential Hypertension Hyperlipidemia Acute Kidney injury Acute encephalopathy related to DKA  Discharge Condition: improved  Diet recommendation: low carb  Filed Weights   07/05/14 0817 07/05/14 1300 07/07/14 2100  Weight: 49.896 kg (110 lb) 49.896 kg (110 lb) 53.933 kg (118 lb 14.4 oz)    History of present illness:  29yo male with hx DM, HTN just d/c 2/11 after an admission for DKA and acute renal failure. Returned 2/14 after being found unresponsive by family. In ER remained altered with CBG >600, tachycardic and acidotic with initial pH 6.7. PCCM called to admit  Hospital Course:  Patient was admitted to the ICU and started on intravenous fluids and insulin per daycare protocol. His anion gap closed and he was transitioned to subcutaneous insulin. His blood sugars have remained relatively stable since then. He is placed on Lantus and sliding scale insulin. He can obtain samples of Lantus from the wellness Center and can be further directed to a drug assistance program. AKI Resolved with IV fluids as did his metabolic encephalopathy. The patient is feeling significantly better now and wants to discharge home. He was educated on the importance of medication compliance and checking his blood sugars, as well as complying with diet. Patient is otherwise stable for discharge home.  Procedures:    Consultations:    Discharge Exam: Filed Vitals:   07/08/14 0815  BP: 117/71  Pulse: 79  Temp: 98.7 F (37.1 C)   Resp: 15    General: NAD Cardiovascular: S1, S2 RRR Respiratory: CTA B  Discharge Instructions   Discharge Instructions    Diet - low sodium heart healthy    Complete by:  As directed      Diet Carb Modified    Complete by:  As directed      Increase activity slowly    Complete by:  As directed           Discharge Medication List as of 07/08/2014 12:26 PM    CONTINUE these medications which have NOT CHANGED   Details  atorvastatin (LIPITOR) 20 MG tablet Take 2 tablets (40 mg total) by mouth daily at 6 PM., Starting 07/02/2014, Until Discontinued, Print    insulin aspart (NOVOLOG) 100 UNIT/ML injection Inject 0-9 Units into the skin 3 (three) times daily with meals., Starting 07/02/2014, Until Discontinued, Normal    insulin aspart protamine- aspart (NOVOLOG MIX 70/30) (70-30) 100 UNIT/ML injection 28 units with breakfast 28 units with dinner, Print    NEEDLE, DISP, 22 G (BD DISP NEEDLES) 22G X 1-1/2" MISC 5 Units by Does not apply route 4 (four) times daily -  with meals and at bedtime., Starting 07/02/2014, Until Discontinued, Normal    Syringe, Disposable, 1 ML MISC 5 Units by Does not apply route 4 (four) times daily -  with meals and at bedtime., Starting 07/02/2014, Until Discontinued, Normal    TRUEPLUS LANCETS 28G MISC 1 each by Does not apply route 3 (three) times daily., Starting 05/04/2014, Until Discontinued, Normal       No Known Allergies Follow-up Information    Follow  up with [name], MD.   Specialty:  Family Medicine   Why:  Walk in Appointment scheduled for Wednesday 2/17 at 2:15pm, Please see Peterson Lombard RN prior to leaving clinic.   Contact information:   [address] [place] [address] [phone]        The results of significant diagnostics from this hospitalization (including imaging, microbiology, ancillary and laboratory) are listed below for reference.    Significant Diagnostic Studies: Dg Chest Portable 1  View  07/05/2014   CLINICAL DATA:  Acute altered level consciousness and hyperglycemia. Initial encounter.  EXAM: PORTABLE CHEST - 1 VIEW  COMPARISON:  06/30/2014 and prior radiographs dating back to 03/10/2012.  FINDINGS: The cardiomediastinal silhouette is unremarkable.  Lungs are clear.  There is no evidence of focal airspace disease, pulmonary edema, suspicious pulmonary nodule/mass, pleural effusion, or pneumothorax. No acute bony abnormalities are identified.  IMPRESSION: No active disease.   Electronically Signed   By: Harmon Pier M.D.   On: 07/05/2014 10:31   Dg Chest Portable 1 View  06/30/2014   CLINICAL DATA:  Hyperglycemia for 1 day. No chest complaints. Nonsmoker. History of hypertension, diabetes, renal insufficiency.  EXAM: PORTABLE CHEST - 1 VIEW  COMPARISON:  04/24/2014  FINDINGS: The heart size and mediastinal contours are within normal limits. Both lungs are clear. The visualized skeletal structures are unremarkable.  IMPRESSION: No active disease.   Electronically Signed   By: Burman Nieves M.D.   On: 06/30/2014 20:33    Microbiology: Recent Results (from the past 240 hour(s))  MRSA PCR Screening     Status: None   Collection Time: 06/30/14 11:56 PM  Result Value Ref Range Status   MRSA by PCR NEGATIVE NEGATIVE Final    Comment:        The GeneXpert MRSA Assay (FDA approved for NASAL specimens only), is one component of a comprehensive MRSA colonization surveillance program. It is not intended to diagnose MRSA infection nor to guide or monitor treatment for MRSA infections.      Labs: Basic Metabolic Panel:  Recent Labs Lab 07/05/14 1349  07/06/14 0236 07/06/14 1145 07/06/14 1932 07/07/14 0221 07/08/14 0521  NA 134*  < > 132* 134* 133* 133* 136  K 3.4*  < > 3.1* 2.8* 3.3* 3.2* 3.9  CL 110  < > 112 112 108 108 109  CO2 <5*  < > 13* 19 21 18* 24  GLUCOSE 268*  < > 104* 104* 206* 285* 412*  BUN 20  < > 10 6 <5* <5* 6  CREATININE 1.40*  < > 0.98 0.75 0.84  0.67 0.59  CALCIUM 8.0*  < > 7.2* 7.7* 7.7* 7.7* 7.9*  MG 1.9  --  2.3  --  2.0 1.9 2.1  PHOS 2.1*  --  1.5*  --  <1.0* 2.3 2.2*  < > = values in this interval not displayed. Liver Function Tests:  Recent Labs Lab 07/05/14 0743  AST 29  ALT 21  ALKPHOS 101  BILITOT 1.3*  PROT 7.5  ALBUMIN 3.7   No results for input(s): LIPASE, AMYLASE in the last 168 hours. No results for input(s): AMMONIA in the last 168 hours. CBC:  Recent Labs Lab 07/05/14 0743 07/07/14 0221 07/08/14 0521  WBC 15.8* 6.3 3.4*  NEUTROABS 12.4*  --  1.8  HGB 14.8 11.3* 11.1*  HCT 42.9 30.9* 30.8*  MCV 86.7 81.1 82.6  PLT 370 171 150   Cardiac Enzymes:  Recent Labs Lab 07/05/14  1349 07/05/14 1825 07/05/14 2243 07/06/14 0236 07/06/14 1145  TROPONINI <0.03 <0.03 <0.03 <0.03 <0.03   BNP: BNP (last 3 results) No results for input(s): BNP in the last 8760 hours.  ProBNP (last 3 results) No results for input(s): PROBNP in the last 8760 hours.  CBG:  Recent Labs Lab 07/07/14 1221 07/07/14 1718 07/07/14 2203 07/08/14 0804 07/08/14 1153  GLUCAP 166* 233* 179* 369* 258*       Signed:  Jahna Liebert  [facility] 07/08/2014, 8:05 PM

## 2014-07-10 NOTE — Progress Notes (Signed)
CARE MANAGEMENT NOTE 07/10/2014  Patient:  [name] [name],[name] [name]   Account Number:  [id number]  Date Initiated:  07/05/2014  Documentation initiated by:  Avie ArenasBROWN,SARAH  Subjective/Objective Assessment:   Readmitted with DKA     Action/Plan:   Anticipated DC Date:  07/07/2014   Anticipated DC Plan:  HOME/SELF CARE      DC Planning Services  CM consult      Choice offered to / List presented to:             Status of service:  Completed, signed off Medicare Important Message given?   (If response is "NO", the following Medicare IM given date fields will be blank) Date Medicare IM given:   Medicare IM given by:   Date Additional Medicare IM given:   Additional Medicare IM given by:    Discharge Disposition:  HOME/SELF CARE  Per UR Regulation:  Reviewed for med. necessity/level of care/duration of stay  If discussed at Long Length of Stay Meetings, dates discussed:    Comments:  07/10/2014 1220 NCM spoke to [facility], Transitional Case Liaison, Suezanne CheshireEdwinna Swan. [facility] and pt did not show up at appt. They attempted to reach out to arrange [facility] for [facility] to follow up and number is not working. NCM contacted brother, Arvilla MarketRyan Jackson # [phone]. States he will give pt message to contact CM. Contacted [facility] and left a message to follow up on Rx for insulin. Isidoro DonningAlesia Kyle Luppino RN CCM Case Mgmt phone [phone]  07-08-14 2:45pm Avie ArenasSarah Brown, RNBSN - [phone] [facility] his lantus insulin but will need an order.  Patient already dc'd but called attending and updated.  Attending [facility] and talking with [facility] about lantus insulin for this.  07/08/2014 1230 [facility]. Pt states he plans to keep appt today. States he will pick up his medications today. Pt states he has to pick up his nephew this afternoon. Prefers to leave now. Attending made aware. Discussed barriers and he states that he  just having difficulty with the insulin and how they work in his body. NCM explained the importance of being compliant with taking medications and follow up with his healthcare team to help reduce complications with diabetes. Verbalized an understanding. Pt states he is currently looking for a job. Isidoro DonningAlesia Noorah Giammona RN CCM Case Mgmt phone [phone]    07-07-14 11:50am Avie ArenasSarah Brown, RNBSN - [phone] Patient awake, lives at home with mother.  Is able to tell me what he has to do with 70/30 Insulin and knows importance of taking.  Talked about a resource he could call with questions and they could call him to check in. Thought that this would be a great idea.  [facility] up.  Was not sure about applying for lantus insulin through clinic.  He does understand that he has an appt tomorrow with specific instructions on discharge sheet to seek a specific person out at this clinic.  07-06-14 10:30am Avie ArenasSarah Brown, RNBSN - [phone] Walk in app[facility] 2[facility]17 [facility] with specific insutruction on dc sheet.  Patient did not know how the 70-30 worked and that he needed to take it on regular schedule - admits to not taking.  Diabetes Education consult sent for continued education.  07-05-14 4:10pm Avie ArenasSarah Brown, RNBSN [phone] Patient has been admitted 3 times since  the first part of Dec 2015.  Used the [employer] in December.  Hooked up with [facility] in January and was seen in there clinic.  Missed the second appt, but did call. Had phone conversation with [facility] post discharge 2-12 and had planned appt for 2-22.  In process of trying to get that appt moved up to this week. CM will continue to follow.

## 2014-07-10 NOTE — Progress Notes (Signed)
07/10/2014 1630 [facility] and pt did pick up insulin from pharmacy. Isidoro DonningAlesia Shaivi Rothschild RN CCM Case Mgmt phone [phone]

## 2014-07-11 LAB — BASIC METABOLIC PANEL
BUN: 20 mg/dL (ref 6–23)
CHLORIDE: 110 mmol/L (ref 96–112)
CO2: <5 mmol/L — CL (ref 19–32)
Calcium: 8 mg/dL — ABNORMAL LOW (ref 8.4–10.5)
Creatinine, Ser: 1.4 mg/dL — ABNORMAL HIGH (ref 0.50–1.35)
GFR calc Af Amer: 77 mL/min — ABNORMAL LOW (ref >90)
GFR, EST NON AFRICAN AMERICAN: 66 mL/min — AB (ref >90)
Glucose, Bld: 268 mg/dL — ABNORMAL HIGH (ref 70–99)
Potassium: 3.4 mmol/L — ABNORMAL LOW (ref 3.5–5.1)
Sodium: 134 mmol/L — ABNORMAL LOW (ref 135–145)

## 2014-07-13 ENCOUNTER — Inpatient Hospital Stay: Payer: Self-pay | Admitting: Family Medicine

## 2014-07-16 ENCOUNTER — Inpatient Hospital Stay (HOSPITAL_COMMUNITY)
Admission: EM | Admit: 2014-07-16 | Discharge: 2014-07-20 | DRG: 638 | Disposition: A | Discharge status: Home / Self Care | Payer: Self-pay | Attending: Family Medicine | Admitting: Family Medicine

## 2014-07-16 ENCOUNTER — Emergency Department (HOSPITAL_COMMUNITY): Payer: Self-pay

## 2014-07-16 ENCOUNTER — Encounter (HOSPITAL_COMMUNITY): Payer: Self-pay | Admitting: Family Medicine

## 2014-07-16 DIAGNOSIS — Z794 Long term (current) use of insulin: Secondary | ICD-10-CM

## 2014-07-16 DIAGNOSIS — E1110 Type 2 diabetes mellitus with ketoacidosis without coma: Secondary | ICD-10-CM | POA: Diagnosis present

## 2014-07-16 DIAGNOSIS — L03211 Cellulitis of face: Secondary | ICD-10-CM | POA: Insufficient documentation

## 2014-07-16 DIAGNOSIS — K029 Dental caries, unspecified: Secondary | ICD-10-CM | POA: Diagnosis present

## 2014-07-16 DIAGNOSIS — E872 Acidosis: Secondary | ICD-10-CM | POA: Diagnosis present

## 2014-07-16 DIAGNOSIS — Z9119 Patient's noncompliance with other medical treatment and regimen: Secondary | ICD-10-CM | POA: Diagnosis present

## 2014-07-16 DIAGNOSIS — E871 Hypo-osmolality and hyponatremia: Secondary | ICD-10-CM | POA: Diagnosis present

## 2014-07-16 DIAGNOSIS — N179 Acute kidney failure, unspecified: Secondary | ICD-10-CM | POA: Diagnosis present

## 2014-07-16 DIAGNOSIS — E10649 Type 1 diabetes mellitus with hypoglycemia without coma: Secondary | ICD-10-CM | POA: Diagnosis present

## 2014-07-16 DIAGNOSIS — N189 Chronic kidney disease, unspecified: Secondary | ICD-10-CM | POA: Diagnosis present

## 2014-07-16 DIAGNOSIS — I129 Hypertensive chronic kidney disease with stage 1 through stage 4 chronic kidney disease, or unspecified chronic kidney disease: Secondary | ICD-10-CM | POA: Diagnosis present

## 2014-07-16 DIAGNOSIS — Z87891 Personal history of nicotine dependence: Secondary | ICD-10-CM

## 2014-07-16 DIAGNOSIS — E876 Hypokalemia: Secondary | ICD-10-CM | POA: Diagnosis present

## 2014-07-16 DIAGNOSIS — R600 Localized edema: Secondary | ICD-10-CM

## 2014-07-16 DIAGNOSIS — I10 Essential (primary) hypertension: Secondary | ICD-10-CM | POA: Diagnosis present

## 2014-07-16 DIAGNOSIS — E1010 Type 1 diabetes mellitus with ketoacidosis without coma: Principal | ICD-10-CM | POA: Diagnosis present

## 2014-07-16 DIAGNOSIS — Z9114 Patient's other noncompliance with medication regimen: Secondary | ICD-10-CM | POA: Diagnosis present

## 2014-07-16 DIAGNOSIS — E860 Dehydration: Secondary | ICD-10-CM | POA: Diagnosis present

## 2014-07-16 LAB — COMPREHENSIVE METABOLIC PANEL
ALT: 13 U/L (ref 0–53)
AST: 16 U/L (ref 0–37)
Albumin: 3.5 g/dL (ref 3.5–5.2)
Alkaline Phosphatase: 86 U/L (ref 39–117)
BUN: 12 mg/dL (ref 6–23)
CO2: <5 mmol/L — CL (ref 19–32)
Calcium: 8.4 mg/dL (ref 8.4–10.5)
Chloride: 95 mmol/L — ABNORMAL LOW (ref 96–112)
Creatinine, Ser: 1.6 mg/dL — ABNORMAL HIGH (ref 0.50–1.35)
GFR calc Af Amer: 65 mL/min — ABNORMAL LOW (ref >90)
GFR calc non Af Amer: 56 mL/min — ABNORMAL LOW (ref >90)
Glucose, Bld: 542 mg/dL — ABNORMAL HIGH (ref 70–99)
Potassium: 3.8 mmol/L (ref 3.5–5.1)
Sodium: 125 mmol/L — ABNORMAL LOW (ref 135–145)
Total Bilirubin: 2 mg/dL — ABNORMAL HIGH (ref 0.3–1.2)
Total Protein: 7.1 g/dL (ref 6.0–8.3)

## 2014-07-16 LAB — CBC
HCT: 37.3 % — ABNORMAL LOW (ref 39.0–52.0)
Hemoglobin: 13.5 g/dL (ref 13.0–17.0)
MCH: 30.9 pg (ref 26.0–34.0)
MCHC: 36.2 g/dL — ABNORMAL HIGH (ref 30.0–36.0)
MCV: 85.4 fL (ref 78.0–100.0)
Platelets: 413 10*3/uL — ABNORMAL HIGH (ref 150–400)
RBC: 4.37 MIL/uL (ref 4.22–5.81)
RDW: 13.6 % (ref 11.5–15.5)
WBC: 21.6 10*3/uL — ABNORMAL HIGH (ref 4.0–10.5)

## 2014-07-16 LAB — I-STAT VENOUS BLOOD GAS, ED
Acid-base deficit: 27 mmol/L — ABNORMAL HIGH (ref 0.0–2.0)
BICARBONATE: 3.3 meq/L — AB (ref 20.0–24.0)
O2 Saturation: 49 %
PCO2 VEN: 15 mmHg — AB (ref 45.0–50.0)
PO2 VEN: 41 mmHg (ref 30.0–45.0)
TCO2: <5 mmol/L (ref 0–100)
pH, Ven: 6.949 — CL (ref 7.250–7.300)

## 2014-07-16 LAB — URINALYSIS, ROUTINE W REFLEX MICROSCOPIC
Bilirubin Urine: NEGATIVE
Glucose, UA: >1000 mg/dL — AB
HGB URINE DIPSTICK: NEGATIVE
Ketones, ur: >80 mg/dL — AB
Leukocytes, UA: NEGATIVE
Nitrite: NEGATIVE
PH: 5 (ref 5.0–8.0)
Protein, ur: 30 mg/dL — AB
SPECIFIC GRAVITY, URINE: 1.028 (ref 1.005–1.030)
Urobilinogen, UA: 0.2 mg/dL (ref 0.0–1.0)

## 2014-07-16 LAB — URINE MICROSCOPIC-ADD ON

## 2014-07-16 LAB — I-STAT CG4 LACTIC ACID, ED: Lactic Acid, Venous: 1.57 mmol/L (ref 0.5–2.0)

## 2014-07-16 LAB — CBG MONITORING, ED: Glucose-Capillary: 462 mg/dL — ABNORMAL HIGH (ref 70–99)

## 2014-07-16 MED ORDER — SODIUM CHLORIDE 0.9 % IV SOLN
1000.0000 mL | INTRAVENOUS | Status: DC
  Administered 2014-07-16: 1000 mL via INTRAVENOUS

## 2014-07-16 MED ORDER — SODIUM CHLORIDE 0.9 % IV SOLN
INTRAVENOUS | Status: DC
  Administered 2014-07-16: 4.6 [IU]/h via INTRAVENOUS
  Filled 2014-07-16 (×2): qty 2.5

## 2014-07-16 MED ORDER — SODIUM CHLORIDE 0.9 % IV BOLUS (SEPSIS)
1000.0000 mL | Freq: Once | INTRAVENOUS | Status: AC
  Administered 2014-07-16: 1000 mL via INTRAVENOUS

## 2014-07-16 MED ORDER — SODIUM CHLORIDE 0.9 % IV SOLN
1000.0000 mL | Freq: Once | INTRAVENOUS | Status: AC
  Administered 2014-07-16: 1000 mL via INTRAVENOUS

## 2014-07-16 MED ORDER — CLINDAMYCIN PHOSPHATE 600 MG/50ML IV SOLN
600.0000 mg | Freq: Once | INTRAVENOUS | Status: AC
  Administered 2014-07-16: 600 mg via INTRAVENOUS
  Filled 2014-07-16: qty 50

## 2014-07-16 MED ORDER — HYDROMORPHONE HCL 1 MG/ML IJ SOLN: 1.0000 mg | Freq: Once | INTRAMUSCULAR | Status: DC

## 2014-07-16 MED ORDER — DEXTROSE-NACL 5-0.45 % IV SOLN: INTRAVENOUS | Status: DC

## 2014-07-16 MED ORDER — IOHEXOL 300 MG/ML  SOLN
100.0000 mL | Freq: Once | INTRAMUSCULAR | Status: AC | PRN
  Administered 2014-07-16: 100 mL via INTRAVENOUS

## 2014-07-16 NOTE — ED Notes (Signed)
CBG Taken = 539

## 2014-07-16 NOTE — ED Notes (Signed)
Pt has large abscess to face. sts x a few days. Pt tachy around 140 at triage. sts nausea, weakness. Hx of DKA.

## 2014-07-16 NOTE — ED Provider Notes (Signed)
CSN: [id number]     Arrival date & time 07/16/14  1846 History   First MD Initiated Contact with Patient 07/16/14 1928     Chief Complaint  Patient presents with  . Abscess  . Tachycardia     (Consider location/radiation/quality/duration/timing/severity/associated sxs/prior Treatment) Patient is a 29 y.o. male presenting with abscess. The history is provided by the patient.  Abscess Location:  Face Facial abscess location:  L cheek Abscess quality: fluctuance, induration and painful   Abscess quality: no warmth   Red streaking: no   Duration:  3 days Progression:  Worsening Pain details:    Quality:  No pain Relieved by:  Nothing Worsened by:  Nothing tried Associated symptoms: no fever, no nausea and no vomiting     Past Medical History  Diagnosis Date  . Diabetes mellitus without complication Dx 2012  . Hypertension Dx 2015  . Renal insufficiency 2011    pt was on dialysis for a short time approx 2011, pt is no longer on dialysis   History reviewed. No pertinent past surgical history. Family History  Problem Relation Age of Onset  . Diabetes Father   . Diabetes Maternal Grandmother   . Heart disease Neg Hx   . Hypertension Neg Hx    History  Substance Use Topics  . Smoking status: Former Smoker    Quit date: 09/19/2013  . Smokeless tobacco: Never Used  . Alcohol Use: No    Review of Systems  Constitutional: Negative for fever.  Respiratory: Positive for shortness of breath.   Gastrointestinal: Negative for nausea, vomiting and abdominal pain.  All other systems reviewed and are negative.     Allergies  Review of patient's allergies indicates no known allergies.  Home Medications   Prior to Admission medications   Medication Sig Start Date End Date Taking? Authorizing Provider  atorvastatin (LIPITOR) 20 MG tablet Take 2 tablets (40 mg total) by mouth daily at 6 PM. 07/02/14   Drema Dallasurtis J Woods, MD  insulin aspart (NOVOLOG) 100 UNIT/ML injection Inject  0-9 Units into the skin 3 (three) times daily with meals. 07/02/14   Drema Dallasurtis J Woods, MD  insulin aspart protamine- aspart (NOVOLOG MIX 70/30) (70-30) 100 UNIT/ML injection 28 units with breakfast 28 units with dinner 07/02/14   Drema Dallasurtis J Woods, MD  NEEDLE, DISP, 22 G (BD DISP NEEDLES) 22G X 1-1/2" MISC 5 Units by Does not apply route 4 (four) times daily -  with meals and at bedtime. 07/02/14   Drema Dallasurtis J Woods, MD  Syringe, Disposable, 1 ML MISC 5 Units by Does not apply route 4 (four) times daily -  with meals and at bedtime. 07/02/14   Drema Dallasurtis J Woods, MD  TRUEPLUS LANCETS 28G MISC 1 each by Does not apply route 3 (three) times daily. Patient not taking: Reported on 06/30/2014 05/04/14   Josalyn C Funches, MD   BP 145/94 mmHg  Pulse 143  Temp(Src) 98.7 F (37.1 C) (Oral)  Resp 16  SpO2 100% Physical Exam  Constitutional: He is oriented to person, place, and time. He appears well-developed and well-nourished. No distress.  HENT:  Head: Normocephalic and atraumatic.  Mouth/Throat: No oropharyngeal exudate.  Eyes: EOM are normal. Pupils are equal, round, and reactive to light.  Neck: Normal range of motion. Neck supple.  Cardiovascular: Regular rhythm.  Tachycardia present.  Exam reveals no friction rub.   No murmur heard. Pulmonary/Chest: Effort normal and breath sounds normal. No respiratory distress. He has no wheezes. He has no  rales.  Abdominal: He exhibits no distension. There is no tenderness. There is no rebound.  Musculoskeletal: Normal range of motion. He exhibits no edema.  Neurological: He is alert and oriented to person, place, and time.  Skin: He is not diaphoretic.  Nursing note and vitals reviewed.   ED Course  Procedures (including critical care time) Labs Review Labs Reviewed  CBG MONITORING, ED - Abnormal; Notable for the following:    Glucose-Capillary 462 (*)    All other components within normal limits  CULTURE, BLOOD (ROUTINE X 2)  CULTURE, BLOOD (ROUTINE X 2)   CBC  COMPREHENSIVE METABOLIC PANEL  URINALYSIS, ROUTINE W REFLEX MICROSCOPIC  BLOOD GAS, VENOUS  I-STAT CG4 LACTIC ACID, ED    Imaging Review Ct Maxillofacial W/cm  07/16/2014   CLINICAL DATA:  Abscess on the face. Soft tissue swelling for a few days. History of diabetes and chronic kidney disease.  EXAM: CT MAXILLOFACIAL WITH CONTRAST  TECHNIQUE: Multidetector CT imaging of the maxillofacial structures was performed with intravenous contrast. Multiplanar CT image reconstructions were also generated. A small metallic BB was placed on the right temple in order to reliably differentiate right from left.  CONTRAST:  OMNIPAQUE IOHEXOL 300 MG/ML  SOLN  COMPARISON:  Head CT 03/10/2012  FINDINGS: Normal appearance of the visualized intracranial structures. Normal appearance of both orbits and globes. There is soft tissue swelling along the left cheek and left side of the mandible. There is no discrete fluid collection or abscess. There is focal edema along the left side of the mandible. No gross abnormality to the parotid tissue. No significant lymphadenopathy in the upper neck.  Paranasal sinuses are clear. No acute bone abnormality. Mandibular condyles are located. Normal appearance of the middle ears and mastoid air cells.  No acute bone abnormality in the cervical spine. The left lower second molar has been removed. There is probably a dental caries involving the left lower first molar. No significant periapical lucency along the left side of the maxilla or mandible. However, there are teeth with periapical lucency and caries involving the right lower teeth.  IMPRESSION: Soft tissue swelling along the left side of the face. Findings are suggestive for an infectious or inflammatory process. There is focal edema along the left side of the mandible. There is not a discrete abscess collection.  Evidence for dental disease but this is predominantly along the right side of the mandible.   Electronically  Signed   By: Richarda Overlie M.D.   On: 07/16/2014 21:50     EKG Interpretation   Date/Time:  Thursday July 16 2014 19:09:55 EST Ventricular Rate:  137 PR Interval:  152 QRS Duration: 66 QT Interval:  354 QTC Calculation: 534 R Axis:   84 Text Interpretation:  Sinus tachycardia Minimal voltage criteria for LVH,  may be normal variant Septal infarct , age undetermined Abnormal ECG No  significant chagnes Confirmed by Gwendolyn Grant  MD, Rodriquez Thorner [phone]) on 07/16/2014  7:37:03 PM     CRITICAL CARE Performed by: Dagmar Hait   Total critical care time: 40 minutes  Critical care time was exclusive of separately billable procedures and treating other patients.  Critical care was necessary to treat or prevent imminent or life-threatening deterioration.  Critical care was time spent personally by me on the following activities: development of treatment plan with patient and/or surrogate as well as nursing, discussions with consultants, evaluation of patient's response to treatment, examination of patient, obtaining history from patient or surrogate, ordering and  performing treatments and interventions, ordering and review of laboratory studies, ordering and review of radiographic studies, pulse oximetry and re-evaluation of patient's condition.  MDM   Final diagnoses:  Type 1 diabetes mellitus with ketoacidosis and without coma  Facial cellulitis    29 year old male presents with a left sided facial abscess. Recently seen here for DKA. Facial pain and tooth pain began a few days ago. No fever here. Patient with hypoglycemia home with his sugar 462. Here he has rapid deep breathing consistent with small breathing. I am concern for DKA today. Left side of his face is swollen. He has left cheek swelling, no dental involvement per my exam. We'll plan on CT of his face. Very tachycardic initially in the 140s, will hydrate with fluids. CT shows facial cellulitis but no abscess.  Patient's VBG  with pH of 6.949. Insulin drip started. Admitted.  I have reviewed all labs and imaging and considered them in my medical decision making.    Elwin Mocha, MD 07/16/14 2229

## 2014-07-16 NOTE — ED Notes (Signed)
Patient transported to CT 

## 2014-07-16 NOTE — H&P (Signed)
[facility] History and Physical  [name] [name] ZOX:[id number] DOB: 1985-11-14 DOA: 07/16/2014  Referring physician: Elwin Mocha, MD PCP: [name], MD   Chief Complaint: Jaw Abscess  HPI: [name] [name] is a 29 y.o. male presents with an abscess in his left jaw for about 4 days. Patient is not sure how the abscess started. He states that the left side of his jaw started to swell. He states that he has had no fevers note. He states there is no headache noted. Patient denies having jaw pain. He has no cough noted. He states that he has been taking his insulin as prescribed. He came to the ED and was found to be in DKA with tachycardia and dehydration. Patient has no history of injury on that side.   Review of Systems:  Constitutional:  No weight loss, night sweats, Fevers, chills, ++fatigue.  HEENT:  No headaches, sneezing, itching, ear ache, nasal congestion, post nasal drip,  Cardio-vascular:  No chest pain, Orthopnea, PND, swelling in lower extremities, anasarca, dizziness GI:  No heartburn, indigestion, abdominal pain, nausea, vomiting, diarrhea, change in bowel habits Resp:  ++shortness of breath with exertion. No excess mucus, no productive cough, No non-productive cough, No coughing up of blood Skin:  Abscess left jaw induration GU:  no dysuria, change in color of urine, no urgency or frequency Musculoskeletal:  No joint pain or swelling. No decreased range of motion Psych:  No change in mood or affect. No depression or anxiety. No memory loss.   Past Medical History  Diagnosis Date  . Diabetes mellitus without complication Dx 2012  . Hypertension Dx 2015  . Renal insufficiency 2011    pt was on dialysis for a short time approx 2011, pt is no longer on dialysis   History reviewed. No pertinent past surgical history. Social History:  reports that he quit smoking about 9 months ago. He has never used smokeless tobacco. He reports that he does not drink  alcohol or use illicit drugs.  No Known Allergies  Family History  Problem Relation Age of Onset  . Diabetes Father   . Diabetes Maternal Grandmother   . Heart disease Neg Hx   . Hypertension Neg Hx      Prior to Admission medications   Medication Sig Start Date End Date Taking? Authorizing Provider  atorvastatin (LIPITOR) 20 MG tablet Take 2 tablets (40 mg total) by mouth daily at 6 PM. 07/02/14   Drema Dallas, MD  insulin aspart (NOVOLOG) 100 UNIT/ML injection Inject 0-9 Units into the skin 3 (three) times daily with meals. 07/02/14   Drema Dallas, MD  insulin aspart protamine- aspart (NOVOLOG MIX 70/30) (70-30) 100 UNIT/ML injection 28 units with breakfast 28 units with dinner 07/02/14   Drema Dallas, MD  NEEDLE, DISP, 22 G (BD DISP NEEDLES) 22G X 1-1/2" MISC 5 Units by Does not apply route 4 (four) times daily -  with meals and at bedtime. 07/02/14   Drema Dallas, MD  Syringe, Disposable, 1 ML MISC 5 Units by Does not apply route 4 (four) times daily -  with meals and at bedtime. 07/02/14   Drema Dallas, MD  TRUEPLUS LANCETS 28G MISC 1 each by Does not apply route 3 (three) times daily. Patient not taking: Reported on 06/30/2014 05/04/14   [name], MD   Physical Exam: Filed Vitals:   07/16/14 2136 07/16/14 2145 07/16/14 2200 07/16/14 2230  BP:  154/87 127/79 143/90  Pulse:  155  143 135  Temp: 97.8 F (36.6 C)     TempSrc: Rectal     Resp:  32 28 26  SpO2:  100% 100% 100%    Wt Readings from Last 3 Encounters:  06/30/14 50.2 kg (110 lb 10.7 oz)  05/01/14 51.71 kg (114 lb)  04/24/14 58.968 kg (130 lb)    General:  Appears calm and comfortable Eyes: PERRL, normal lids, irises & conjunctiva ENT: left side of jaw swollen with induration and tenderness Neck: no LAD, masses or thyromegaly Cardiovascular: RRR, no m/r/g. No LE edema Respiratory: CTA bilaterally, no w/r/r. Normal respiratory effort. Abdomen: soft, ntnd Skin: left jaw induration  noted Musculoskeletal: grossly normal tone BUE/BLE Psychiatric: grossly normal mood and affect Neurologic: grossly non-focal.          Labs on Admission:  Basic Metabolic Panel:  Recent Labs Lab 07/16/14 1949  NA 125*  K 3.8  CL 95*  CO2 <5*  GLUCOSE 542*  BUN 12  CREATININE 1.60*  CALCIUM 8.4   Liver Function Tests:  Recent Labs Lab 07/16/14 1949  AST 16  ALT 13  ALKPHOS 86  BILITOT 2.0*  PROT 7.1  ALBUMIN 3.5   No results for input(s): LIPASE, AMYLASE in the last 168 hours. No results for input(s): AMMONIA in the last 168 hours. CBC:  Recent Labs Lab 07/16/14 1949  WBC 21.6*  HGB 13.5  HCT 37.3*  MCV 85.4  PLT 413*   Cardiac Enzymes: No results for input(s): CKTOTAL, CKMB, CKMBINDEX, TROPONINI in the last 168 hours.  BNP (last 3 results) No results for input(s): BNP in the last 8760 hours.  ProBNP (last 3 results) No results for input(s): PROBNP in the last 8760 hours.  CBG:  Recent Labs Lab 07/16/14 1926  GLUCAP 462*    Radiological Exams on Admission: Ct Maxillofacial W/cm  07/16/2014   CLINICAL DATA:  Abscess on the face. Soft tissue swelling for a few days. History of diabetes and chronic kidney disease.  EXAM: CT MAXILLOFACIAL WITH CONTRAST  TECHNIQUE: Multidetector CT imaging of the maxillofacial structures was performed with intravenous contrast. Multiplanar CT image reconstructions were also generated. A small metallic BB was placed on the right temple in order to reliably differentiate right from left.  CONTRAST:  100mL OMNIPAQUE IOHEXOL 300 MG/ML  SOLN  COMPARISON:  Head CT 03/10/2012  FINDINGS: Normal appearance of the visualized intracranial structures. Normal appearance of both orbits and globes. There is soft tissue swelling along the left cheek and left side of the mandible. There is no discrete fluid collection or abscess. There is focal edema along the left side of the mandible. No gross abnormality to the parotid tissue. No  significant lymphadenopathy in the upper neck.  Paranasal sinuses are clear. No acute bone abnormality. Mandibular condyles are located. Normal appearance of the middle ears and mastoid air cells.  No acute bone abnormality in the cervical spine. The left lower second molar has been removed. There is probably a dental caries involving the left lower first molar. No significant periapical lucency along the left side of the maxilla or mandible. However, there are teeth with periapical lucency and caries involving the right lower teeth.  IMPRESSION: Soft tissue swelling along the left side of the face. Findings are suggestive for an infectious or inflammatory process. There is focal edema along the left side of the mandible. There is not a discrete abscess collection.  Evidence for dental disease but this is predominantly along the right side of the  mandible.   Electronically Signed   By: Richarda Overlie M.D.   On: 07/16/2014 21:50     Assessment/Plan Active Problems:   Dehydration   Diabetic ketoacidosis without coma associated with type 1 diabetes mellitus   Noncompliance with medication regimen   DKA (diabetic ketoacidoses)   1. DKA -will admit to step down -will start on insulin drip -monitor labs -aggressive hydration with IVF NS and switch to D5 once glucose below 250 -will check A1C -ordered Mg Phos labs monitor potassium  2. Left Facial Abscess -primary ?dental problem -will start on clindamycin empirically -may need dental consult  3. Dehydration -started on aggressive fluid resucitation -will monitor labs  4. AKI -due to dehydration -will monitor labs  5. Hyponatremia -due to hyperglycemia -repeat labs   Code Status: Full Code (must indicate code status--if unknown or must be presumed, indicate so) DVT Prophylaxis:Heparin Family Communication: None (indicate person spoken with, if applicable, with phone number if by telephone) Disposition Plan: Home (indicate anticipated  LOS)  Time spent:  [facility] Pager [phone]

## 2014-07-16 NOTE — ED Notes (Signed)
CRITICAL VALUE ALERT  Critical value received:  CO2 <5

## 2014-07-17 LAB — BASIC METABOLIC PANEL
ANION GAP: 7 (ref 5–15)
Anion gap: 13 (ref 5–15)
Anion gap: 11 (ref 5–15)
Anion gap: 10 (ref 5–15)
Anion gap: 8 (ref 5–15)
Anion gap: 8 (ref 5–15)
BUN: 11 mg/dL (ref 6–23)
BUN: 11 mg/dL (ref 6–23)
BUN: 9 mg/dL (ref 6–23)
BUN: 5 mg/dL — ABNORMAL LOW (ref 6–23)
BUN: <5 mg/dL — ABNORMAL LOW (ref 6–23)
CALCIUM: 7.8 mg/dL — AB (ref 8.4–10.5)
CHLORIDE: 106 mmol/L (ref 96–112)
CO2: 9 mmol/L — CL (ref 19–32)
CO2: 10 mmol/L — CL (ref 19–32)
CO2: 11 mmol/L — AB (ref 19–32)
CO2: 16 mmol/L — AB (ref 19–32)
CO2: 16 mmol/L — ABNORMAL LOW (ref 19–32)
CO2: 19 mmol/L (ref 19–32)
CREATININE: 0.81 mg/dL (ref 0.50–1.35)
CREATININE: 0.69 mg/dL (ref 0.50–1.35)
CREATININE: 0.71 mg/dL (ref 0.50–1.35)
Calcium: 7.4 mg/dL — ABNORMAL LOW (ref 8.4–10.5)
Calcium: 7.2 mg/dL — ABNORMAL LOW (ref 8.4–10.5)
Calcium: 7.5 mg/dL — ABNORMAL LOW (ref 8.4–10.5)
Calcium: 7.8 mg/dL — ABNORMAL LOW (ref 8.4–10.5)
Calcium: 8.2 mg/dL — ABNORMAL LOW (ref 8.4–10.5)
Chloride: 112 mmol/L (ref 96–112)
Chloride: 110 mmol/L (ref 96–112)
Chloride: 109 mmol/L (ref 96–112)
Chloride: 106 mmol/L (ref 96–112)
Chloride: 107 mmol/L (ref 96–112)
Creatinine, Ser: 1.31 mg/dL (ref 0.50–1.35)
Creatinine, Ser: 0.96 mg/dL (ref 0.50–1.35)
Creatinine, Ser: 0.76 mg/dL (ref 0.50–1.35)
GFR calc Af Amer: 83 mL/min — ABNORMAL LOW (ref >90)
GFR calc Af Amer: >90 mL/min (ref >90)
GFR calc Af Amer: >90 mL/min (ref >90)
GFR calc non Af Amer: 72 mL/min — ABNORMAL LOW (ref >90)
GFR calc non Af Amer: >90 mL/min (ref >90)
GLUCOSE: 137 mg/dL — AB (ref 70–99)
GLUCOSE: 177 mg/dL — AB (ref 70–99)
GLUCOSE: 185 mg/dL — AB (ref 70–99)
Glucose, Bld: 243 mg/dL — ABNORMAL HIGH (ref 70–99)
Glucose, Bld: 156 mg/dL — ABNORMAL HIGH (ref 70–99)
Glucose, Bld: 77 mg/dL (ref 70–99)
POTASSIUM: 3.2 mmol/L — AB (ref 3.5–5.1)
POTASSIUM: 3.6 mmol/L (ref 3.5–5.1)
POTASSIUM: 2.8 mmol/L — AB (ref 3.5–5.1)
Potassium: 3.4 mmol/L — ABNORMAL LOW (ref 3.5–5.1)
Potassium: 2.9 mmol/L — ABNORMAL LOW (ref 3.5–5.1)
Potassium: 3 mmol/L — ABNORMAL LOW (ref 3.5–5.1)
SODIUM: 134 mmol/L — AB (ref 135–145)
SODIUM: 130 mmol/L — AB (ref 135–145)
Sodium: 131 mmol/L — ABNORMAL LOW (ref 135–145)
Sodium: 130 mmol/L — ABNORMAL LOW (ref 135–145)
Sodium: 130 mmol/L — ABNORMAL LOW (ref 135–145)
Sodium: 133 mmol/L — ABNORMAL LOW (ref 135–145)

## 2014-07-17 LAB — GLUCOSE, CAPILLARY
GLUCOSE-CAPILLARY: 132 mg/dL — AB (ref 70–99)
GLUCOSE-CAPILLARY: 158 mg/dL — AB (ref 70–99)
GLUCOSE-CAPILLARY: 162 mg/dL — AB (ref 70–99)
GLUCOSE-CAPILLARY: 163 mg/dL — AB (ref 70–99)
GLUCOSE-CAPILLARY: 169 mg/dL — AB (ref 70–99)
GLUCOSE-CAPILLARY: 181 mg/dL — AB (ref 70–99)
GLUCOSE-CAPILLARY: 200 mg/dL — AB (ref 70–99)
GLUCOSE-CAPILLARY: 274 mg/dL — AB (ref 70–99)
GLUCOSE-CAPILLARY: 390 mg/dL — AB (ref 70–99)
GLUCOSE-CAPILLARY: 480 mg/dL — AB (ref 70–99)
GLUCOSE-CAPILLARY: 96 mg/dL (ref 70–99)
Glucose-Capillary: 115 mg/dL — ABNORMAL HIGH (ref 70–99)
Glucose-Capillary: 139 mg/dL — ABNORMAL HIGH (ref 70–99)
Glucose-Capillary: 145 mg/dL — ABNORMAL HIGH (ref 70–99)
Glucose-Capillary: 150 mg/dL — ABNORMAL HIGH (ref 70–99)
Glucose-Capillary: 157 mg/dL — ABNORMAL HIGH (ref 70–99)
Glucose-Capillary: 162 mg/dL — ABNORMAL HIGH (ref 70–99)
Glucose-Capillary: 163 mg/dL — ABNORMAL HIGH (ref 70–99)
Glucose-Capillary: 192 mg/dL — ABNORMAL HIGH (ref 70–99)
Glucose-Capillary: 195 mg/dL — ABNORMAL HIGH (ref 70–99)
Glucose-Capillary: 524 mg/dL — ABNORMAL HIGH (ref 70–99)
Glucose-Capillary: 539 mg/dL — ABNORMAL HIGH (ref 70–99)
Glucose-Capillary: 69 mg/dL — ABNORMAL LOW (ref 70–99)
Glucose-Capillary: 98 mg/dL (ref 70–99)

## 2014-07-17 LAB — CBC
HEMATOCRIT: 33 % — AB (ref 39.0–52.0)
HEMOGLOBIN: 11.6 g/dL — AB (ref 13.0–17.0)
MCH: 29.1 pg (ref 26.0–34.0)
MCHC: 35.2 g/dL (ref 30.0–36.0)
MCV: 82.7 fL (ref 78.0–100.0)
Platelets: 368 10*3/uL (ref 150–400)
RBC: 3.99 MIL/uL — ABNORMAL LOW (ref 4.22–5.81)
RDW: 13.3 % (ref 11.5–15.5)
WBC: 21.5 10*3/uL — ABNORMAL HIGH (ref 4.0–10.5)

## 2014-07-17 LAB — MAGNESIUM: Magnesium: 2 mg/dL (ref 1.5–2.5)

## 2014-07-17 LAB — PHOSPHORUS: PHOSPHORUS: 1.2 mg/dL — AB (ref 2.3–4.6)

## 2014-07-17 MED ORDER — CLINDAMYCIN PHOSPHATE 600 MG/50ML IV SOLN
600.0000 mg | Freq: Four times a day (QID) | INTRAVENOUS | Status: DC
  Administered 2014-07-17 – 2014-07-20 (×13): 600 mg via INTRAVENOUS
  Filled 2014-07-17 (×16): qty 50

## 2014-07-17 MED ORDER — ATORVASTATIN CALCIUM 40 MG PO TABS
40.0000 mg | ORAL_TABLET | Freq: Every day | ORAL | Status: DC
  Administered 2014-07-17 – 2014-07-19 (×3): 40 mg via ORAL
  Filled 2014-07-17 (×4): qty 1

## 2014-07-17 MED ORDER — DEXTROSE-NACL 5-0.45 % IV SOLN
INTRAVENOUS | Status: DC
  Administered 2014-07-17: 04:00:00 via INTRAVENOUS

## 2014-07-17 MED ORDER — POTASSIUM CHLORIDE 10 MEQ/100ML IV SOLN
10.0000 meq | INTRAVENOUS | Status: AC
  Administered 2014-07-17 (×2): 10 meq via INTRAVENOUS
  Filled 2014-07-17 (×2): qty 100

## 2014-07-17 MED ORDER — SODIUM CHLORIDE 0.45 % IV SOLN
INTRAVENOUS | Status: DC
  Administered 2014-07-17 – 2014-07-18 (×3): via INTRAVENOUS

## 2014-07-17 MED ORDER — SODIUM CHLORIDE 0.9 % IV SOLN: INTRAVENOUS | Status: DC

## 2014-07-17 MED ORDER — SODIUM CHLORIDE 0.45 % IV SOLN: INTRAVENOUS | Status: DC

## 2014-07-17 MED ORDER — GLUCERNA SHAKE PO LIQD
237.0000 mL | Freq: Three times a day (TID) | ORAL | Status: DC
  Administered 2014-07-17 – 2014-07-20 (×10): 237 mL via ORAL
  Filled 2014-07-17: qty 237

## 2014-07-17 MED ORDER — POTASSIUM CHLORIDE 10 MEQ/100ML IV SOLN
10.0000 meq | INTRAVENOUS | Status: AC
  Administered 2014-07-17 (×4): 10 meq via INTRAVENOUS
  Filled 2014-07-17 (×4): qty 100

## 2014-07-17 MED ORDER — POTASSIUM CHLORIDE 10 MEQ/100ML IV SOLN
10.0000 meq | INTRAVENOUS | Status: AC
  Administered 2014-07-17 (×3): 10 meq via INTRAVENOUS
  Filled 2014-07-17 (×3): qty 100

## 2014-07-17 MED ORDER — HYDROCODONE-ACETAMINOPHEN 5-325 MG PO TABS
1.0000 | ORAL_TABLET | Freq: Four times a day (QID) | ORAL | Status: DC | PRN
  Administered 2014-07-17: 1 via ORAL
  Filled 2014-07-17: qty 1

## 2014-07-17 MED ORDER — INSULIN ASPART PROT & ASPART (70-30 MIX) 100 UNIT/ML ~~LOC~~ SUSP
28.0000 [IU] | Freq: Two times a day (BID) | SUBCUTANEOUS | Status: DC
  Administered 2014-07-17 – 2014-07-20 (×6): 28 [IU] via SUBCUTANEOUS
  Filled 2014-07-17: qty 10

## 2014-07-17 MED ORDER — SODIUM CHLORIDE 0.9 % IV SOLN
INTRAVENOUS | Status: DC
  Filled 2014-07-17: qty 2.5

## 2014-07-17 MED ORDER — HEPARIN SODIUM (PORCINE) 5000 UNIT/ML IJ SOLN
5000.0000 [IU] | Freq: Three times a day (TID) | INTRAMUSCULAR | Status: DC
  Administered 2014-07-17 – 2014-07-20 (×9): 5000 [IU] via SUBCUTANEOUS
  Filled 2014-07-17 (×13): qty 1

## 2014-07-17 MED ORDER — INSULIN ASPART 100 UNIT/ML ~~LOC~~ SOLN
0.0000 [IU] | Freq: Three times a day (TID) | SUBCUTANEOUS | Status: DC
  Administered 2014-07-18 (×2): 3 [IU] via SUBCUTANEOUS
  Administered 2014-07-18: 1 [IU] via SUBCUTANEOUS
  Administered 2014-07-19: 5 [IU] via SUBCUTANEOUS
  Administered 2014-07-19: 7 [IU] via SUBCUTANEOUS
  Administered 2014-07-19: 1 [IU] via SUBCUTANEOUS
  Administered 2014-07-20: 3 [IU] via SUBCUTANEOUS
  Administered 2014-07-20: 7 [IU] via SUBCUTANEOUS

## 2014-07-17 MED ORDER — POTASSIUM CHLORIDE 10 MEQ/100ML IV SOLN
10.0000 meq | INTRAVENOUS | Status: AC
  Administered 2014-07-17 – 2014-07-18 (×3): 10 meq via INTRAVENOUS
  Filled 2014-07-17: qty 100

## 2014-07-17 NOTE — Progress Notes (Addendum)
[facility] PROGRESS NOTE  [name] [name] ZOX:[id number] DOB: [birth year] DOA: 07/16/2014 PCP: [name], MD  Assessment/Plan: 1. DKA- patient started on DKA protocol, currently on IV insulin. AG is 10, CO2 still low 11. Continue IV insulin, blood sugar has improved. Bicarb gap is -14, patient has hyperchloremic metabolic acidosis. Will discontinue the insulin drip. 2. Left facial swelling- CT maxillofacial does not show abscess, but findings suggestive  of infectious or inflammatory process. 3. Hyponatremia- pseudohyponatremia which is improving after blood glucoses improved. 4. DVT prophylaxis- heparin  Code Status: Full code Family Communication: *No family at bedside Disposition Plan: Home when stable   Consultants:  None  Procedures:  None  Antibiotics:  None  HPI/Subjective: 29 y.o. male presents with an abscess in his left jaw for about 4 days. Patient is not sure how the abscess started. He states that the left side of his jaw started to swell. He states that he has had no fevers note. He states there is no headache noted. Patient denies having jaw pain. He has no cough noted. He states that he has been taking his insulin as prescribed. He came to the ED and was found to be in DKA with tachycardia and dehydration. Patient has no history of injury on that side.  No complaints this morning.  Objective: Filed Vitals:   07/17/14 1236  BP: 123/74  Pulse: 117  Temp: 99.3 F (37.4 C)  Resp: 14    Intake/Output Summary (Last 24 hours) at 07/17/14 1433 Last data filed at 07/17/14 1417  Gross per 24 hour  Intake 4897.34 ml  Output   2290 ml  Net 2607.34 ml   Filed Weights   07/17/14 0055  Weight: 46.7 kg (102 lb 15.3 oz)    Exam:   General:  Appear in no acute distress  HEENT- Swelling noted on the left side of cheeck  Cardiovascular: s1s2 RRR  Respiratory: Clear bilaterally  Abdomen: Soft, nontender, no organomegaly  Musculoskeletal: No  edema of the lower extremities   Data Reviewed: Basic Metabolic Panel:  Recent Labs Lab 07/16/14 1949 07/17/14 0253 07/17/14 0649 07/17/14 0939  NA 125* 134* 131* 130*  K 3.8 3.2* 3.6 3.4*  CL 95* 112 110 109  CO2 <5* 9* 10* 11*  GLUCOSE 542* 243* 137* 177*  BUN CREATININE 1.60* 1.31 0.96 0.81  CALCIUM 8.4 7.4* 7.2* 7.5*  MG  --  2.0  --   --   PHOS  --  1.2*  --   --    Liver Function Tests:  Recent Labs Lab 07/16/14 1949  AST 16  ALT 13  ALKPHOS 86  BILITOT 2.0*  PROT 7.1  ALBUMIN 3.5   No results for input(s): LIPASE, AMYLASE in the last 168 hours. No results for input(s): AMMONIA in the last 168 hours. CBC:  Recent Labs Lab 07/16/14 1949 07/17/14 0253  WBC 21.6* 21.5*  HGB 13.5 11.6*  HCT 37.3* 33.0*  MCV 85.4 82.7  PLT 413* 368   Cardiac Enzymes: No results for input(s): CKTOTAL, CKMB, CKMBINDEX, TROPONINI in the last 168 hours. BNP (last 3 results) No results for input(s): BNP in the last 8760 hours.  ProBNP (last 3 results) No results for input(s): PROBNP in the last 8760 hours.  CBG:  Recent Labs Lab 07/17/14 0647 07/17/14 0758 07/17/14 0929 07/17/14 1238 07/17/14 1325  GLUCAP 145* 162* 169* 200* 162*    No results found for this or any previous visit (from the  past 240 hour(s)).   Studies: Ct Maxillofacial W/cm  07/16/2014   CLINICAL DATA:  Abscess on the face. Soft tissue swelling for a few days. History of diabetes and chronic kidney disease.  EXAM: CT MAXILLOFACIAL WITH CONTRAST  TECHNIQUE: Multidetector CT imaging of the maxillofacial structures was performed with intravenous contrast. Multiplanar CT image reconstructions were also generated. A small metallic BB was placed on the right temple in order to reliably differentiate right from left.  CONTRAST:  100mL OMNIPAQUE IOHEXOL 300 MG/ML  SOLN  COMPARISON:  Head CT 03/10/2012  FINDINGS: Normal appearance of the visualized intracranial structures. Normal appearance of  both orbits and globes. There is soft tissue swelling along the left cheek and left side of the mandible. There is no discrete fluid collection or abscess. There is focal edema along the left side of the mandible. No gross abnormality to the parotid tissue. No significant lymphadenopathy in the upper neck.  Paranasal sinuses are clear. No acute bone abnormality. Mandibular condyles are located. Normal appearance of the middle ears and mastoid air cells.  No acute bone abnormality in the cervical spine. The left lower second molar has been removed. There is probably a dental caries involving the left lower first molar. No significant periapical lucency along the left side of the maxilla or mandible. However, there are teeth with periapical lucency and caries involving the right lower teeth.  IMPRESSION: Soft tissue swelling along the left side of the face. Findings are suggestive for an infectious or inflammatory process. There is focal edema along the left side of the mandible. There is not a discrete abscess collection.  Evidence for dental disease but this is predominantly along the right side of the mandible.   Electronically Signed   By: Richarda OverlieAdam  Henn M.D.   On: 07/16/2014 21:50    Scheduled Meds: . atorvastatin  40 mg Oral q1800  . clindamycin (CLEOCIN) IV  600 mg Intravenous 4 times per day  . feeding supplement (GLUCERNA SHAKE)  237 mL Oral TID BM  . heparin  5,000 Units Subcutaneous 3 times per day   Continuous Infusions: . dextrose 5 % and 0.45% NaCl 100 mL/hr at 07/17/14 1200  . insulin (NOVOLIN-R) infusion 4.1 mL/hr at 07/17/14 1200    Active Problems:   Dehydration   Diabetic ketoacidosis without coma associated with type 1 diabetes mellitus   Noncompliance with medication regimen   DKA (diabetic ketoacidoses)    Time spent: 20 min    [facility],Jayah Balthazar S  [facility] Pager [phone]. If 7PM-7AM, please contact night-coverage at [email or web address], password [email or web address] 07/17/2014, 2:33 PM   LOS: 1 day

## 2014-07-17 NOTE — Progress Notes (Signed)
CRITICAL VALUE ALERT  Critical value received:  Co2: 10  Date of notification:  07/17/14  Time of notification:  0800  Critical value read back:Yes.    Nurse who received alert:  Milly Jakobnikki Aynslee Mulhall  MD notified (1st page):  Expected value  Time of first page:    MD notified (2nd page):  Time of second page:  Responding MD:    Time MD responded:

## 2014-07-17 NOTE — Progress Notes (Signed)
Inpatient Diabetes Program Recommendations  AACE/ADA: New Consensus Statement on Inpatient Glycemic Control (2013)  Target Ranges:  Prepandial:   less than 140 mg/dL      Peak postprandial:   less than 180 mg/dL (1-2 hours)      Critically ill patients:  140 - 180 mg/dL   Results for MAYSON, MCNEISH (MRN [id number]) as of 07/17/2014 10:52  Ref. Range 07/17/2014 01:42 07/17/2014 02:44 07/17/2014 03:47 07/17/2014 04:51 07/17/2014 05:48 07/17/2014 06:47 07/17/2014 07:58 07/17/2014 09:29  Glucose-Capillary Latest Range: 70-99 mg/dL 390 (H) 274 (H) 163 (H) 139 (H) 132 (H) 145 (H) 162 (H) 169 (H)   Diabetes history: DM1 Outpatient Diabetes medications: 70/30 28 units BID with meals, Novolog 0-9 units TID with  Current orders for Inpatient glycemic control: Novolin R insulin drip per GlucoStabilizer on DKA protocol  Inpatient Diabetes Program Recommendations Insulin - Basal: Once criteria is met to transition patient off IV insulin to SQ insulin, please consider ordering 70/30 28 units BID (with breakfast and supper).  Correction (SSI): Once criteria is met to transition patient off IV insulin to SQ insulin, please consider ordering Novolog sensitive correction scale.  Note: Noted CBGs have been in target range over the past 6 hours. However, CO2 was 11 and AG is 10 on labs today at 9:39 am.  Per DKA protocol,  Insulin drip should be continued until acidosis is corrected as determined by MD (Venous CO2=20, normal anion gap (8-12), negative ketones).  Thanks, Barnie Alderman, RN, MSN, CCRN, CDE Diabetes Coordinator Inpatient Diabetes Program [phone] (Team Pager) [phone] ([place] office) [phone] [facility] office)

## 2014-07-17 NOTE — Progress Notes (Signed)
Received report on pt. Awaiting arrival.

## 2014-07-17 NOTE — Progress Notes (Signed)
INITIAL NUTRITION ASSESSMENT  DOCUMENTATION CODES Per approved criteria  -Severe malnutrition in the context of chronic illness -underweight   Pt meets criteria for severe MALNUTRITION in the context of chronic illness as evidenced by >5% weight loss in 1 month, and severe muscle mass and body fat depletion.  INTERVENTION: Provide Glucerna Shake TID, each provides 220 kcal, 10 grams of protein, 180 ml of H20  NUTRITION DIAGNOSIS: Inadequate oral intake related to acute injury; jaw abscess as evidenced by decreased PO intake.   Goal: Pt to meet >/= 90% of estimated energy needs  Monitor:  Diet advancement, PO intake, weight, labs  Reason for Assessment: Pt identified due to Malnutrition Screening Tool   29 y.o. male  Admitting Dx: <principal problem not specified>  ASSESSMENT: 29 y/o male presented with an abscess in his left jaw for about 4 days. Pt unsure of how abscess started, and states that the left side of his jaw started to swell. Pt came to ED and was found to be in DKA with tachycardia and dehydration.   Labs- low sodium, CO2, calcium; high glucose Wt hx reveals 7.3% wt loss in <1 month. Pt confirmed wt loss and decreased ability to eat due to his jaw abscess. He reported not eating much for the past week, and could only tolerate liquids. Pt has been eating broths and shakes recently. Pt denied any nausea, vomiting or abdominal pain. He felt hungry upon assessment. Noted pt with hx of uncontrolled DM due to medication noncompliance (last Hgb A1c: 12.5). However, pt reports feeling comfortable managing his DM and declined RD offer for education at visit.  Pt likes Glucerna shakes; provide TID  Nutrition Focused Physical Exam:  Subcutaneous Fat:  Orbital Region: moderate depletion Upper Arm Region: mild depletion Thoracic and Lumbar Region: severe depletion  Muscle:  Temple Region: moderate depletion Clavicle Bone Region: moderate depletion Clavicle and Acromion  Bone Region: severe depletion Scapular Bone Region: mild depletion Dorsal Hand: mild depletion Patellar Region: moderate depletion Anterior Thigh Region: moderate depletion Posterior Calf Region: mild depletion  Edema: not present  Height: Ht Readings from Last 1 Encounters:  07/17/14 5\' 6"  (1.676 m)    Weight: Wt Readings from Last 1 Encounters:  07/17/14 102 lb 15.3 oz (46.7 kg)    Ideal Body Weight: 142 lb (64.5 kg)  % Ideal Body Weight: 72%  Wt Readings from Last 10 Encounters:  07/17/14 102 lb 15.3 oz (46.7 kg)  06/30/14 110 lb 10.7 oz (50.2 kg)  05/01/14 114 lb (51.71 kg)  04/24/14 130 lb (58.968 kg)  07/07/13 110 lb 7.2 oz (50.1 kg)  03/21/12 126 lb 4.8 oz (57.289 kg)    Usual Body Weight: unknown  % Usual Body Weight: -  BMI:  Body mass index is 16.63 kg/(m^2). underweight  Estimated Nutritional Needs: Kcal: 1650-1800 kcal Protein: 70-80 Fluid: >/= 1.5L daily  Skin: intact  Diet Order: Diet NPO time specified  EDUCATION NEEDS: -No education needs identified at this time   Intake/Output Summary (Last 24 hours) at 07/17/14 0941 Last data filed at 07/17/14 0900  Gross per 24 hour  Intake 4062.84 ml  Output   2100 ml  Net 1962.84 ml    Last BM: 2/25  Labs:   Recent Labs Lab 07/16/14 1949 07/17/14 0253 07/17/14 0649  NA 125* 134* 131*  K 3.8 3.2* 3.6  CL 95* 112 110  CO2 <5* 9* 10*  BUN 12 11 11   CREATININE 1.60* 1.31 0.96  CALCIUM 8.4 7.4* 7.2*  MG  --  2.0  --   PHOS  --  1.2*  --   GLUCOSE 542* 243* 137*    CBG (last 3)   Recent Labs  07/17/14 0647 07/17/14 0758 07/17/14 0929  GLUCAP 145* 162* 169*   Lab Results  Component Value Date   HGBA1C 12.5* 07/05/2014   Scheduled Meds: . atorvastatin  40 mg Oral q1800  . clindamycin (CLEOCIN) IV  600 mg Intravenous 4 times per day  . heparin  5,000 Units Subcutaneous 3 times per day    Continuous Infusions: . sodium chloride Stopped (07/17/14 0351)  . dextrose 5 % and  0.45% NaCl 100 mL/hr at 07/17/14 0900  . insulin (NOVOLIN-R) infusion 1.1 mL/hr at 07/17/14 0900    Past Medical History  Diagnosis Date  . Diabetes mellitus without complication Dx 2012  . Hypertension Dx 2015  . Renal insufficiency 2011    pt was on dialysis for a short time approx 2011, pt is no longer on dialysis    History reviewed. No pertinent past surgical history.  Cristela Felt, MS Dietetic Intern Pager: [phone]

## 2014-07-17 NOTE — Progress Notes (Signed)
UR COMPLETED  

## 2014-07-18 DIAGNOSIS — E1010 Type 1 diabetes mellitus with ketoacidosis without coma: Secondary | ICD-10-CM | POA: Insufficient documentation

## 2014-07-18 DIAGNOSIS — R600 Localized edema: Secondary | ICD-10-CM

## 2014-07-18 DIAGNOSIS — E876 Hypokalemia: Secondary | ICD-10-CM

## 2014-07-18 LAB — URINE CULTURE
Colony Count: NO GROWTH
Culture: NO GROWTH

## 2014-07-18 LAB — BASIC METABOLIC PANEL
ANION GAP: 5 (ref 5–15)
Anion gap: 5 (ref 5–15)
BUN: <5 mg/dL — ABNORMAL LOW (ref 6–23)
BUN: <5 mg/dL — ABNORMAL LOW (ref 6–23)
CO2: 18 mmol/L — ABNORMAL LOW (ref 19–32)
CO2: 23 mmol/L (ref 19–32)
CREATININE: 0.58 mg/dL (ref 0.50–1.35)
Calcium: 7.7 mg/dL — ABNORMAL LOW (ref 8.4–10.5)
Calcium: 7.7 mg/dL — ABNORMAL LOW (ref 8.4–10.5)
Chloride: 108 mmol/L (ref 96–112)
Chloride: 107 mmol/L (ref 96–112)
Creatinine, Ser: 0.51 mg/dL (ref 0.50–1.35)
GFR calc Af Amer: >90 mL/min (ref >90)
GFR calc Af Amer: >90 mL/min (ref >90)
GFR calc non Af Amer: >90 mL/min (ref >90)
GLUCOSE: 176 mg/dL — AB (ref 70–99)
GLUCOSE: 128 mg/dL — AB (ref 70–99)
POTASSIUM: 2.9 mmol/L — AB (ref 3.5–5.1)
Potassium: 3 mmol/L — ABNORMAL LOW (ref 3.5–5.1)
Sodium: 131 mmol/L — ABNORMAL LOW (ref 135–145)
Sodium: 135 mmol/L (ref 135–145)

## 2014-07-18 LAB — GLUCOSE, CAPILLARY
Glucose-Capillary: 167 mg/dL — ABNORMAL HIGH (ref 70–99)
Glucose-Capillary: 204 mg/dL — ABNORMAL HIGH (ref 70–99)
Glucose-Capillary: 137 mg/dL — ABNORMAL HIGH (ref 70–99)
Glucose-Capillary: 214 mg/dL — ABNORMAL HIGH (ref 70–99)
Glucose-Capillary: 95 mg/dL (ref 70–99)

## 2014-07-18 LAB — HEMOGLOBIN A1C
Hgb A1c MFr Bld: 11.8 % — ABNORMAL HIGH (ref 4.8–5.6)
MEAN PLASMA GLUCOSE: 292 mg/dL

## 2014-07-18 LAB — MAGNESIUM: MAGNESIUM: 1.9 mg/dL (ref 1.5–2.5)

## 2014-07-18 MED ORDER — POTASSIUM CHLORIDE CRYS ER 20 MEQ PO TBCR
40.0000 meq | EXTENDED_RELEASE_TABLET | ORAL | Status: AC
Start: 2014-07-18 — End: 2014-07-18
  Administered 2014-07-18 (×2): 40 meq via ORAL
  Filled 2014-07-18 (×2): qty 2

## 2014-07-18 MED ORDER — CHLORHEXIDINE GLUCONATE 0.12 % MT SOLN
15.0000 mL | Freq: Two times a day (BID) | OROMUCOSAL | Status: DC
  Administered 2014-07-18 – 2014-07-20 (×5): 15 mL via OROMUCOSAL
  Filled 2014-07-18 (×5): qty 15

## 2014-07-18 MED ORDER — POTASSIUM CHLORIDE 10 MEQ/100ML IV SOLN
10.0000 meq | INTRAVENOUS | Status: AC
  Administered 2014-07-18 (×3): 10 meq via INTRAVENOUS
  Filled 2014-07-18: qty 100

## 2014-07-18 MED ORDER — CETYLPYRIDINIUM CHLORIDE 0.05 % MT LIQD
7.0000 mL | Freq: Two times a day (BID) | OROMUCOSAL | Status: DC
  Administered 2014-07-18 – 2014-07-20 (×5): 7 mL via OROMUCOSAL

## 2014-07-18 NOTE — Progress Notes (Signed)
CRITICAL VALUE ALERT  Critical value received:  K=2.9  Date of notification:  07/18/2014  Time of notification:  0425  Critical value read back:yes  Nurse who received alert:  Sharlene DoryMelvin kuffor  MD notified (1st page):  Tom cal;ahan  Time of first page:  0425 MD notified (2nd page):  Time of second page:  Responding MD:  Einar Pheasantom Calahan  Time MD responded: [phone]

## 2014-07-18 NOTE — Progress Notes (Signed)
CRITICAL VALUE ALERT  Critical value received:  K=2.8  Date of notification:  07/17/2014  Time of notification:  2140  Critical value read back:yes  Nurse who received alert:  Anselm LisMelvin kuffour  MD notified (1st page):  Tom calahan  Time of first page:  2140  MD notified (2nd page):  Time of second page:  Responding MD:  Einar Pheasantom calahan  Time MD responded:  2145

## 2014-07-18 NOTE — Progress Notes (Signed)
[facility] PROGRESS NOTE  [name] [name] ZOX:[id number] DOB: July 25, 1985 DOA: 07/16/2014 PCP: [name], MD  Assessment/Plan: 1. DKA- patient started on DKA protocol, currently on IV insulin. Now resolved. Started on sliding scale insulin. 2.  Hyperchloremic metabolic acidosis - resolved Bicarb gap was -14, with normal AG 10,  patient has hyperchloremic metabolic acidosis. Will discontinue the insulin drip. 3. Left facial swelling- CT maxillofacial does not show abscess, but findings suggestive  of infectious or inflammatory process. There is no oral surgeon on call over the weekend, called and discussed with ENT Dr. Annalee Genta who recommends to continue IV antibiotics and get dental or oral surgery consult on Monday  4. Hypokalemia - potassium replaced and still potassium is low . Will check serum magnesium level . Again given K Dur 40 mg by mouth twice a day for 2  more doses and check BMP in a.m.  5. Hyponatremia- pseudohyponatremia which is improving after blood glucoses improved. 6. DVT prophylaxis- heparin  Code Status: Full code Family Communication: *No family at bedside Disposition Plan: Home when stable   Consultants:  None  Procedures:  None  Antibiotics:  None  HPI/Subjective: 29 y.o. male presents with an abscess in his left jaw for about 4 days. Patient is not sure how the abscess started. He states that the left side of his jaw started to swell. He states that he has had no fevers note. He states there is no headache noted. Patient denies having jaw pain. He has no cough noted. He states that he has been taking his insulin as prescribed. He came to the ED and was found to be in DKA with tachycardia and dehydration. Patient has no history of injury on that side.  No complaints this morning.  Objective: Filed Vitals:   07/18/14 1130  BP:   Pulse:   Temp: 98.9 F (37.2 C)  Resp: 20    Intake/Output Summary (Last 24 hours) at 07/18/14 1338 Last  data filed at 07/18/14 1000  Gross per 24 hour  Intake 3632.25 ml  Output   3590 ml  Net  42.25 ml   Filed Weights   07/17/14 0055  Weight: 46.7 kg (102 lb 15.3 oz)    Exam:   General:  Appear in no acute distress  HEENT- Swelling noted on the left side of cheeck  Cardiovascular: s1s2 RRR  Respiratory: Clear bilaterally  Abdomen: Soft, nontender, no organomegaly  Musculoskeletal: No edema of the lower extremities   Data Reviewed: Basic Metabolic Panel:  Recent Labs Lab 07/17/14 0253  07/17/14 1445 07/17/14 1714 07/17/14 2031 07/18/14 0309 07/18/14 1125  NA 134*  < > 130* 130* 133* 131* 135  K 3.2*  < > 2.9* 3.0* 2.8* 2.9* 3.0*  CL 112  < > 106 106 107 108 107  CO2 9*  < > 16* 16* 19 18* 23  GLUCOSE 243*  < > 185* 156* 77 176* 128*  BUN 11  < > 5* <5* <5* <5* <5*  CREATININE 1.31  < > 0.76 0.69 0.71 0.51 0.58  CALCIUM 7.4*  < > 7.8* 7.8* 8.2* 7.7* 7.7*  MG 2.0  --   --   --   --   --   --   PHOS 1.2*  --   --   --   --   --   --   < > = values in this interval not displayed. Liver Function Tests:  Recent Labs Lab 07/16/14 1949  AST 16  ALT 13  ALKPHOS 86  BILITOT 2.0*  PROT 7.1  ALBUMIN 3.5   No results for input(s): LIPASE, AMYLASE in the last 168 hours. No results for input(s): AMMONIA in the last 168 hours. CBC:  Recent Labs Lab 07/16/14 1949 07/17/14 0253  WBC 21.6* 21.5*  HGB 13.5 11.6*  HCT 37.3* 33.0*  MCV 85.4 82.7  PLT 413* 368   Cardiac Enzymes: No results for input(s): CKTOTAL, CKMB, CKMBINDEX, TROPONINI in the last 168 hours. BNP (last 3 results) No results for input(s): BNP in the last 8760 hours.  ProBNP (last 3 results) No results for input(s): PROBNP in the last 8760 hours.  CBG:  Recent Labs Lab 07/17/14 2108 07/17/14 2151 07/18/14 0306 07/18/14 0738 07/18/14 1131  GLUCAP 69* 98 167* 204* 137*    Recent Results (from the past 240 hour(s))  Blood culture (routine x 2)     Status: None (Preliminary result)    Collection Time: 07/16/14  8:30 PM  Result Value Ref Range Status   Specimen Description BLOOD LEFT ANTECUBITAL  Final   Special Requests BOTTLES DRAWN AEROBIC AND ANAEROBIC 5CC  Final   Culture   Final           BLOOD CULTURE RECEIVED NO GROWTH TO DATE CULTURE WILL BE HELD FOR 5 DAYS BEFORE ISSUING A FINAL NEGATIVE REPORT [facility]    Report Status PENDING  Incomplete  Blood culture (routine x 2)     Status: None (Preliminary result)   Collection Time: 07/16/14  8:38 PM  Result Value Ref Range Status   Specimen Description BLOOD LEFT FOREARM  Final   Special Requests BOTTLES DRAWN AEROBIC ONLY 2CC  Final   Culture   Final           BLOOD CULTURE RECEIVED NO GROWTH TO DATE CULTURE WILL BE HELD FOR 5 DAYS BEFORE ISSUING A FINAL NEGATIVE REPORT Performed at [facility]    Report Status PENDING  Incomplete  Urine culture     Status: None   Collection Time: 07/17/14  1:56 AM  Result Value Ref Range Status   Specimen Description URINE, CLEAN CATCH  Final   Special Requests NONE  Final   Colony Count NO GROWTH Performed at [facility]   Final   Culture NO GROWTH Performed at [facility]   Final   Report Status 07/18/2014 FINAL  Final     Studies: Ct Maxillofacial W/cm  07/16/2014   CLINICAL DATA:  Abscess on the face. Soft tissue swelling for a few days. History of diabetes and chronic kidney disease.  EXAM: CT MAXILLOFACIAL WITH CONTRAST  TECHNIQUE: Multidetector CT imaging of the maxillofacial structures was performed with intravenous contrast. Multiplanar CT image reconstructions were also generated. A small metallic BB was placed on the right temple in order to reliably differentiate right from left.  CONTRAST:  100mL OMNIPAQUE IOHEXOL 300 MG/ML  SOLN  COMPARISON:  Head CT 03/10/2012  FINDINGS: Normal appearance of the visualized intracranial structures. Normal appearance of both orbits and globes. There is soft tissue swelling along  the left cheek and left side of the mandible. There is no discrete fluid collection or abscess. There is focal edema along the left side of the mandible. No gross abnormality to the parotid tissue. No significant lymphadenopathy in the upper neck.  Paranasal sinuses are clear. No acute bone abnormality. Mandibular condyles are located. Normal appearance of the middle ears and mastoid air cells.  No acute  bone abnormality in the cervical spine. The left lower second molar has been removed. There is probably a dental caries involving the left lower first molar. No significant periapical lucency along the left side of the maxilla or mandible. However, there are teeth with periapical lucency and caries involving the right lower teeth.  IMPRESSION: Soft tissue swelling along the left side of the face. Findings are suggestive for an infectious or inflammatory process. There is focal edema along the left side of the mandible. There is not a discrete abscess collection.  Evidence for dental disease but this is predominantly along the right side of the mandible.   Electronically Signed   By: Richarda Overlie M.D.   On: 07/16/2014 21:50    Scheduled Meds: . antiseptic oral rinse  7 mL Mouth Rinse q12n4p  . atorvastatin  40 mg Oral q1800  . chlorhexidine  15 mL Mouth Rinse BID  . clindamycin (CLEOCIN) IV  600 mg Intravenous 4 times per day  . feeding supplement (GLUCERNA SHAKE)  237 mL Oral TID BM  . heparin  5,000 Units Subcutaneous 3 times per day  . insulin aspart  0-9 Units Subcutaneous TID WC  . insulin aspart protamine- aspart  28 Units Subcutaneous BID WC  . potassium chloride  40 mEq Oral Q4H   Continuous Infusions: . sodium chloride 75 mL/hr at 07/18/14 0800    Active Problems:   Dehydration   Diabetic ketoacidosis without coma associated with type 1 diabetes mellitus   Noncompliance with medication regimen   DKA (diabetic ketoacidoses)    Time spent: 20 min    [facility]  [facility] Pager [phone]. If 7PM-7AM, please contact night-coverage at [email or web address], password [facility] 07/18/2014, 1:38 PM  LOS: 2 days

## 2014-07-19 DIAGNOSIS — I10 Essential (primary) hypertension: Secondary | ICD-10-CM

## 2014-07-19 LAB — GLUCOSE, CAPILLARY
Glucose-Capillary: 128 mg/dL — ABNORMAL HIGH (ref 70–99)
Glucose-Capillary: 334 mg/dL — ABNORMAL HIGH (ref 70–99)
Glucose-Capillary: 122 mg/dL — ABNORMAL HIGH (ref 70–99)

## 2014-07-19 LAB — BASIC METABOLIC PANEL
Anion gap: 5 (ref 5–15)
BUN: 6 mg/dL (ref 6–23)
CALCIUM: 8.1 mg/dL — AB (ref 8.4–10.5)
CHLORIDE: 110 mmol/L (ref 96–112)
CO2: 25 mmol/L (ref 19–32)
Creatinine, Ser: 0.58 mg/dL (ref 0.50–1.35)
GFR calc Af Amer: >90 mL/min (ref >90)
Glucose, Bld: 158 mg/dL — ABNORMAL HIGH (ref 70–99)
Potassium: 3.7 mmol/L (ref 3.5–5.1)
Sodium: 140 mmol/L (ref 135–145)

## 2014-07-19 NOTE — Progress Notes (Signed)
[facility] PROGRESS NOTE  [name] [name] ZOX:[id number] DOB: Jan 23, 1986 DOA: 07/16/2014 PCP: Lora PaulaFUNCHES, JOSALYN C, MD  Assessment/Plan: 1. DKA- patient started on DKA protocol, currently on IV insulin. Now resolved. Started on sliding scale insulin. 2.  Hyperchloremic metabolic acidosis - resolved Bicarb gap was -14, with normal AG 10,  patient has hyperchloremic metabolic acidosis. Insulin drip was discontinued. 3. Left facial swelling- CT maxillofacial does not show abscess, but findings suggestive  of infectious or inflammatory process. There is no oral surgeon on call over the weekend, called and discussed with ENT Dr. Annalee GentaShoemaker who recommends to continue IV antibiotics and get dental or oral surgery consult on Monday  4. Hypokalemia - resolved 5. Hyponatremia-resolved, pseudohyponatremia which improved after blood glucose came down. 6. DVT prophylaxis- heparin  Code Status: Full code Family Communication: *No family at bedside Disposition Plan: Home when stable   Consultants:  None  Procedures:  None  Antibiotics:  None  HPI/Subjective: 29 y.o. male presents with an abscess in his left jaw for about 4 days. Patient is not sure how the abscess started. He states that the left side of his jaw started to swell. He states that he has had no fevers note. He states there is no headache noted. Patient denies having jaw pain. He has no cough noted. He states that he has been taking his insulin as prescribed. He came to the ED and was found to be in DKA with tachycardia and dehydration. Patient has no history of injury on that side.  No complaints this morning.  Objective: Filed Vitals:   07/19/14 0817  BP: 133/71  Pulse:   Temp:   Resp: 17    Intake/Output Summary (Last 24 hours) at 07/19/14 1029 Last data filed at 07/19/14 0817  Gross per 24 hour  Intake   1960 ml  Output   3625 ml  Net  -1665 ml   Filed Weights   07/17/14 0055  Weight: 46.7 kg (102 lb 15.3 oz)     Exam:   General:  Appear in no acute distress  HEENT- Swelling noted on the left side of cheeck  Cardiovascular: s1s2 RRR  Respiratory: Clear bilaterally  Abdomen: Soft, nontender, no organomegaly  Musculoskeletal: No edema of the lower extremities   Data Reviewed: Basic Metabolic Panel:  Recent Labs Lab 07/17/14 0253  07/17/14 1714 07/17/14 2031 07/18/14 0309 07/18/14 1125 07/18/14 1400 07/19/14 0230  NA 134*  < > 130* 133* 131* 135  --  140  K 3.2*  < > 3.0* 2.8* 2.9* 3.0*  --  3.7  CL 112  < > 106 107 108 107  --  110  CO2 9*  < > 16* 19 18* 23  --  25  GLUCOSE 243*  < > 156* 77 176* 128*  --  158*  BUN 11  < > <5* <5* <5* <5*  --  6  CREATININE 1.31  < > 0.69 0.71 0.51 0.58  --  0.58  CALCIUM 7.4*  < > 7.8* 8.2* 7.7* 7.7*  --  8.1*  MG 2.0  --   --   --   --   --  1.9  --   PHOS 1.2*  --   --   --   --   --   --   --   < > = values in this interval not displayed. Liver Function Tests:  Recent Labs Lab 07/16/14 1949  AST 16  ALT 13  ALKPHOS 86  BILITOT  2.0*  PROT 7.1  ALBUMIN 3.5   No results for input(s): LIPASE, AMYLASE in the last 168 hours. No results for input(s): AMMONIA in the last 168 hours. CBC:  Recent Labs Lab 07/16/14 1949 07/17/14 0253  WBC 21.6* 21.5*  HGB 13.5 11.6*  HCT 37.3* 33.0*  MCV 85.4 82.7  PLT 413* 368    CBG:  Recent Labs Lab 07/18/14 0306 07/18/14 0738 07/18/14 1131 07/18/14 1613 07/18/14 2148  GLUCAP 167* 204* 137* 214* 95    Recent Results (from the past 240 hour(s))  Blood culture (routine x 2)     Status: None (Preliminary result)   Collection Time: 07/16/14  8:30 PM  Result Value Ref Range Status   Specimen Description BLOOD LEFT ANTECUBITAL  Final   Special Requests BOTTLES DRAWN AEROBIC AND ANAEROBIC 5CC  Final   Culture   Final           BLOOD CULTURE RECEIVED NO GROWTH TO DATE CULTURE WILL BE HELD FOR 5 DAYS BEFORE ISSUING A FINAL NEGATIVE REPORT Performed at [facility]     Report Status PENDING  Incomplete  Blood culture (routine x 2)     Status: None (Preliminary result)   Collection Time: 07/16/14  8:38 PM  Result Value Ref Range Status   Specimen Description BLOOD LEFT FOREARM  Final   Special Requests BOTTLES DRAWN AEROBIC ONLY 2CC  Final   Culture   Final           BLOOD CULTURE RECEIVED NO GROWTH TO DATE CULTURE WILL BE HELD FOR 5 DAYS BEFORE ISSUING A FINAL NEGATIVE REPORT Performed at [facility]    Report Status PENDING  Incomplete  Urine culture     Status: None   Collection Time: 07/17/14  1:56 AM  Result Value Ref Range Status   Specimen Description URINE, CLEAN CATCH  Final   Special Requests NONE  Final   Colony Count NO GROWTH Performed at [facility]   Final   Culture NO GROWTH Performed at [facility]   Final   Report Status 07/18/2014 FINAL  Final     Studies: No results found.  Scheduled Meds: . antiseptic oral rinse  7 mL Mouth Rinse q12n4p  . atorvastatin  40 mg Oral q1800  . chlorhexidine  15 mL Mouth Rinse BID  . clindamycin (CLEOCIN) IV  600 mg Intravenous 4 times per day  . feeding supplement (GLUCERNA SHAKE)  237 mL Oral TID BM  . heparin  5,000 Units Subcutaneous 3 times per day  . insulin aspart  0-9 Units Subcutaneous TID WC  . insulin aspart protamine- aspart  28 Units Subcutaneous BID WC   Continuous Infusions:    Active Problems:   Dehydration   Diabetic ketoacidosis without coma associated with type 1 diabetes mellitus   Noncompliance with medication regimen   Hypokalemia   DKA (diabetic ketoacidoses)   Essential hypertension   Facial edema   Type 1 diabetes mellitus with ketoacidosis and without coma    Time spent: 20 min    [facility]  [facility] Pager [phone]. If 7PM-7AM, please contact night-coverage at [email or web address], password [facility] 07/19/2014, 10:29 AM  LOS: 3 days

## 2014-07-20 DIAGNOSIS — L03211 Cellulitis of face: Secondary | ICD-10-CM | POA: Insufficient documentation

## 2014-07-20 LAB — GLUCOSE, CAPILLARY
Glucose-Capillary: 250 mg/dL — ABNORMAL HIGH (ref 70–99)
Glucose-Capillary: 332 mg/dL — ABNORMAL HIGH (ref 70–99)

## 2014-07-20 MED ORDER — CLINDAMYCIN HCL 300 MG PO CAPS: 300.0000 mg | ORAL_CAPSULE | Freq: Three times a day (TID) | ORAL | Status: DC

## 2014-07-20 MED ORDER — INSULIN ASPART PROT & ASPART (70-30 MIX) 100 UNIT/ML ~~LOC~~ SUSP: 30.0000 [IU] | Freq: Two times a day (BID) | SUBCUTANEOUS | Status: DC

## 2014-07-20 MED ORDER — HYDROCODONE-ACETAMINOPHEN 5-325 MG PO TABS: 1.0000 | ORAL_TABLET | Freq: Four times a day (QID) | ORAL | Status: DC | PRN

## 2014-07-20 NOTE — Progress Notes (Signed)
Inpatient Diabetes Program Recommendations  AACE/ADA: New Consensus Statement on Inpatient Glycemic Control (2013)  Target Ranges:  Prepandial:   less than 140 mg/dL      Peak postprandial:   less than 180 mg/dL (1-2 hours)      Critically ill patients:  140 - 180 mg/dL   Results for [name] [name], [name] [name] (MRN [id number]) as of 07/20/2014 09:50  Ref. Range 07/18/2014 07:38 07/18/2014 11:31 07/18/2014 16:13 07/18/2014 21:48 07/19/2014 12:13 07/19/2014 17:13 07/19/2014 21:38 07/20/2014 07:40  Glucose-Capillary Latest Range: 70-99 mg/dL 409204 (H) 811137 (H) 914214 (H) 95 128 (H) 334 (H) 122 (H) 250 (H)   Diabetes history: DM1 Outpatient Diabetes medications: 70/30 28 units BID with meals, Novolog 0-9 units TID with  Current orders for Inpatient glycemic control: 70/30 28 units BID with meals, Novolog 0-9 units TID with meals  Inpatient Diabetes Program Recommendations Insulin - Basal: Please consider increasing 70/30 to 30 units QAM with breakfast and 70/30 32 units with supper. Correction (SSI): Please consider ordering Novolog bedtime correction scale.  Thanks, Orlando PennerMarie Savilla Turbyfill, RN, MSN, CCRN, CDE Diabetes Coordinator Inpatient Diabetes Program [phone] (Team Pager)  [phone] ([facility] office) [phone] [facility] office)

## 2014-07-20 NOTE — Discharge Summary (Addendum)
Physician Discharge Summary  [name] [name] VWU:[id number] DOB: 28-Jul-1985 DOA: 07/16/2014  PCP: Lora PaulaFUNCHES, JOSALYN C, MD  Admit date: 07/16/2014 Discharge date: 07/20/2014  Time spent: *25 minutes  Recommendations for Outpatient Follow-up:  1. Follow up Dentist on 07/21/14  Discharge Diagnoses:  Active Problems:   Dehydration   Diabetic ketoacidosis without coma associated with type 1 diabetes mellitus   Noncompliance with medication regimen   Hypokalemia   DKA (diabetic ketoacidoses)   Essential hypertension   Facial edema   Type 1 diabetes mellitus with ketoacidosis and without coma   Discharge Condition: Stable  Diet recommendation: Carb modified diet*   Filed Weights   07/17/14 0055  Weight: 46.7 kg (102 lb 15.3 oz)    History of present illness:  29 y.o. male presents with an abscess in his left jaw for about 4 days. Patient is not sure how the abscess started. He states that the left side of his jaw started to swell. He states that he has had no fevers note. He states there is no headache noted. Patient denies having jaw pain. He has no cough noted. He states that he has been taking his insulin as prescribed. He came to the ED and was found to be in DKA with tachycardia and dehydration. Patient has no history of injury on that side.  Hospital Course:  1. DKA- patient started on DKA protocol, currently on IV insulin. Now resolved. Started on sliding scale insulin. 2. Diabetes Mellitus- Blood glucose is mildly elevated, will increase the Insulin 70/30 to 30 units bid 3. Hyperchloremic metabolic acidosis - resolved Bicarb gap was -14, with normal AG 10, patient has hyperchloremic metabolic acidosis. Insulin drip was discontinued. 4. Left facial swelling- CT maxillofacial does not show abscess, but findings suggestive of infectious or inflammatory process. There is no oral surgeon on call over the weekend, called and discussed with ENT Dr. Annalee GentaShoemaker who recommends to continue IV  antibiotics and get dental or oral surgery consult on Monday. Called the dentist on call Dr Russella DarBenitez and he recommends to continue with Clindamycin 300 mg po TID and follow up in the office on 07/21/14. Will give the patient instructions to follow up in the office. 5. Hypokalemia - resolved 6. Hyponatremia-resolved, pseudohyponatremia which improved after blood glucose came down  Procedures:  None  Consultations:  None  Discharge Exam: Filed Vitals:   07/20/14 0800  BP:   Pulse: 96  Temp:   Resp: 16    General: Apear in no acute distress Cardiovascular: S1S2 RRR Respiratory:Clear bilaterally  Discharge Instructions   Discharge Instructions    Diet - low sodium heart healthy    Complete by:  As directed      Increase activity slowly    Complete by:  As directed           Current Discharge Medication List    START taking these medications   Details  clindamycin (CLEOCIN) 300 MG capsule Take 1 capsule (300 mg total) by mouth 3 (three) times daily. Qty: 10 capsule, Refills: 0    HYDROcodone-acetaminophen (NORCO/VICODIN) 5-325 MG per tablet Take 1 tablet by mouth every 6 (six) hours as needed for moderate pain. Qty: 15 tablet, Refills: 0    !! insulin aspart protamine- aspart (NOVOLOG MIX 70/30) (70-30) 100 UNIT/ML injection Inject 0.3 mLs (30 Units total) into the skin 2 (two) times daily with a meal. Qty: 10 mL, Refills: 11     !! - Potential duplicate medications found. Please discuss with provider.  CONTINUE these medications which have NOT CHANGED   Details  atorvastatin (LIPITOR) 20 MG tablet Take 2 tablets (40 mg total) by mouth daily at 6 PM. Qty: 60 tablet, Refills: 0    insulin aspart (NOVOLOG) 100 UNIT/ML injection Inject 0-9 Units into the skin 3 (three) times daily with meals. Qty: 10 mL, Refills: 11    !! insulin aspart protamine- aspart (NOVOLOG MIX 70/30) (70-30) 100 UNIT/ML injection 28 units with breakfast 28 units with dinner Qty: 10 mL,  Refills: 11    NEEDLE, DISP, 22 G (BD DISP NEEDLES) 22G X 1-1/2" MISC 5 Units by Does not apply route 4 (four) times daily -  with meals and at bedtime. Qty: 200 each, Refills: 0    Syringe, Disposable, 1 ML MISC 5 Units by Does not apply route 4 (four) times daily -  with meals and at bedtime. Qty: 200 each, Refills: 0    TRUEPLUS LANCETS 28G MISC 1 each by Does not apply route 3 (three) times daily. Qty: 100 each, Refills: 12     !! - Potential duplicate medications found. Please discuss with provider.     No Known Allergies Follow-up Information    Follow up with Myriam Forehand, MD On 07/21/2014.   Specialty:  Dentistry   Why:  Call office for paperwork   Contact information:   [address], [address] [place] [address] [phone]        The results of significant diagnostics from this hospitalization (including imaging, microbiology, ancillary and laboratory) are listed below for reference.    Significant Diagnostic Studies: Ct Maxillofacial W/cm  07/16/2014   CLINICAL DATA:  Abscess on the face. Soft tissue swelling for a few days. History of diabetes and chronic kidney disease.  EXAM: CT MAXILLOFACIAL WITH CONTRAST  TECHNIQUE: Multidetector CT imaging of the maxillofacial structures was performed with intravenous contrast. Multiplanar CT image reconstructions were also generated. A small metallic BB was placed on the right temple in order to reliably differentiate right from left.  CONTRAST:  OMNIPAQUE IOHEXOL 300 MG/ML  SOLN  COMPARISON:  Head CT 03/10/2012  FINDINGS: Normal appearance of the visualized intracranial structures. Normal appearance of both orbits and globes. There is soft tissue swelling along the left cheek and left side of the mandible. There is no discrete fluid collection or abscess. There is focal edema along the left side of the mandible. No gross abnormality to the parotid tissue. No significant lymphadenopathy in the upper neck.  Paranasal  sinuses are clear. No acute bone abnormality. Mandibular condyles are located. Normal appearance of the middle ears and mastoid air cells.  No acute bone abnormality in the cervical spine. The left lower second molar has been removed. There is probably a dental caries involving the left lower first molar. No significant periapical lucency along the left side of the maxilla or mandible. However, there are teeth with periapical lucency and caries involving the right lower teeth.  IMPRESSION: Soft tissue swelling along the left side of the face. Findings are suggestive for an infectious or inflammatory process. There is focal edema along the left side of the mandible. There is not a discrete abscess collection.  Evidence for dental disease but this is predominantly along the right side of the mandible.   Electronically Signed   By: Richarda Overlie M.D.   On: 07/16/2014 21:50   Dg Chest Portable 1 View  07/05/2014   CLINICAL DATA:  Acute altered level consciousness and hyperglycemia. Initial encounter.  EXAM: PORTABLE CHEST - 1 VIEW  COMPARISON:  06/30/2014 and prior radiographs dating back to 03/10/2012.  FINDINGS: The cardiomediastinal silhouette is unremarkable.  Lungs are clear.  There is no evidence of focal airspace disease, pulmonary edema, suspicious pulmonary nodule/mass, pleural effusion, or pneumothorax. No acute bony abnormalities are identified.  IMPRESSION: No active disease.   Electronically Signed   By: Harmon Pier M.D.   On: 07/05/2014 10:31   Dg Chest Portable 1 View  06/30/2014   CLINICAL DATA:  Hyperglycemia for 1 day. No chest complaints. Nonsmoker. History of hypertension, diabetes, renal insufficiency.  EXAM: PORTABLE CHEST - 1 VIEW  COMPARISON:  04/24/2014  FINDINGS: The heart size and mediastinal contours are within normal limits. Both lungs are clear. The visualized skeletal structures are unremarkable.  IMPRESSION: No active disease.   Electronically Signed   By: Burman Nieves M.D.   On:  06/30/2014 20:33    Microbiology: Recent Results (from the past 240 hour(s))  Blood culture (routine x 2)     Status: None (Preliminary result)   Collection Time: 07/16/14  8:30 PM  Result Value Ref Range Status   Specimen Description BLOOD LEFT ANTECUBITAL  Final   Special Requests BOTTLES DRAWN AEROBIC AND ANAEROBIC 5CC  Final   Culture   Final           BLOOD CULTURE RECEIVED NO GROWTH TO DATE CULTURE WILL BE HELD FOR 5 DAYS BEFORE ISSUING A FINAL NEGATIVE REPORT Performed at [facility]    Report Status PENDING  Incomplete  Blood culture (routine x 2)     Status: None (Preliminary result)   Collection Time: 07/16/14  8:38 PM  Result Value Ref Range Status   Specimen Description BLOOD LEFT FOREARM  Final   Special Requests BOTTLES DRAWN AEROBIC ONLY 2CC  Final   Culture   Final           BLOOD CULTURE RECEIVED NO GROWTH TO DATE CULTURE WILL BE HELD FOR 5 DAYS BEFORE ISSUING A FINAL NEGATIVE REPORT Performed at [facility]    Report Status PENDING  Incomplete  Urine culture     Status: None   Collection Time: 07/17/14  1:56 AM  Result Value Ref Range Status   Specimen Description URINE, CLEAN CATCH  Final   Special Requests NONE  Final   Colony Count NO GROWTH Performed at [facility]   Final   Culture NO GROWTH Performed at [facility]   Final   Report Status 07/18/2014 FINAL  Final     Labs: Basic Metabolic Panel:  Recent Labs Lab 07/17/14 0253  07/17/14 1714 07/17/14 2031 07/18/14 0309 07/18/14 1125 07/18/14 1400 07/19/14 0230  NA 134*  < > 130* 133* 131* 135  --  140  K 3.2*  < > 3.0* 2.8* 2.9* 3.0*  --  3.7  CL 112  < > 106 107 108 107  --  110  CO2 9*  < > 16* 19 18* 23  --  25  GLUCOSE 243*  < > 156* 77 176* 128*  --  158*  BUN 11  < > <5* <5* <5* <5*  --  6  CREATININE 1.31  < > 0.69 0.71 0.51 0.58  --  0.58  CALCIUM 7.4*  < > 7.8* 8.2* 7.7* 7.7*  --  8.1*  MG 2.0  --   --   --   --   --  1.9  --   PHOS 1.2*  --    --   --   --   --   --   --   < > =  values in this interval not displayed. Liver Function Tests:  Recent Labs Lab 07/16/14 1949  AST 16  ALT 13  ALKPHOS 86  BILITOT 2.0*  PROT 7.1  ALBUMIN 3.5   No results for input(s): LIPASE, AMYLASE in the last 168 hours. No results for input(s): AMMONIA in the last 168 hours. CBC:  Recent Labs Lab 07/16/14 1949 07/17/14 0253  WBC 21.6* 21.5*  HGB 13.5 11.6*  HCT 37.3* 33.0*  MCV 85.4 82.7  PLT 413* 368   Cardiac Enzymes: No results for input(s): CKTOTAL, CKMB, CKMBINDEX, TROPONINI in the last 168 hours. BNP: BNP (last 3 results) No results for input(s): BNP in the last 8760 hours.  ProBNP (last 3 results) No results for input(s): PROBNP in the last 8760 hours.  CBG:  Recent Labs Lab 07/18/14 2148 07/19/14 1213 07/19/14 1713 07/19/14 2138 07/20/14 0740  GLUCAP 95 128* 334* 122* 250*       Signed:  Zan Orlick S  [facility] 07/20/2014, 10:25 AM

## 2014-07-23 LAB — CULTURE, BLOOD (ROUTINE X 2)
CULTURE: NO GROWTH
Culture: NO GROWTH

## 2014-08-13 ENCOUNTER — Inpatient Hospital Stay (HOSPITAL_COMMUNITY)
Admission: EM | Admit: 2014-08-13 | Discharge: 2014-08-15 | DRG: 637 | Disposition: A | Discharge status: Home / Self Care | Payer: Self-pay | Attending: Internal Medicine | Admitting: Internal Medicine

## 2014-08-13 ENCOUNTER — Emergency Department (HOSPITAL_COMMUNITY): Payer: Self-pay

## 2014-08-13 ENCOUNTER — Encounter (HOSPITAL_COMMUNITY): Payer: Self-pay | Admitting: Emergency Medicine

## 2014-08-13 DIAGNOSIS — E1065 Type 1 diabetes mellitus with hyperglycemia: Secondary | ICD-10-CM

## 2014-08-13 DIAGNOSIS — N289 Disorder of kidney and ureter, unspecified: Secondary | ICD-10-CM | POA: Diagnosis present

## 2014-08-13 DIAGNOSIS — Z9119 Patient's noncompliance with other medical treatment and regimen: Secondary | ICD-10-CM | POA: Diagnosis present

## 2014-08-13 DIAGNOSIS — E876 Hypokalemia: Secondary | ICD-10-CM | POA: Diagnosis present

## 2014-08-13 DIAGNOSIS — I10 Essential (primary) hypertension: Secondary | ICD-10-CM | POA: Diagnosis present

## 2014-08-13 DIAGNOSIS — Z681 Body mass index (BMI) 19 or less, adult: Secondary | ICD-10-CM

## 2014-08-13 DIAGNOSIS — Z87891 Personal history of nicotine dependence: Secondary | ICD-10-CM

## 2014-08-13 DIAGNOSIS — E43 Unspecified severe protein-calorie malnutrition: Secondary | ICD-10-CM | POA: Diagnosis present

## 2014-08-13 DIAGNOSIS — E1010 Type 1 diabetes mellitus with ketoacidosis without coma: Principal | ICD-10-CM | POA: Diagnosis present

## 2014-08-13 DIAGNOSIS — IMO0002 Reserved for concepts with insufficient information to code with codable children: Secondary | ICD-10-CM | POA: Diagnosis present

## 2014-08-13 DIAGNOSIS — E010 Iodine-deficiency related diffuse (endemic) goiter: Secondary | ICD-10-CM | POA: Diagnosis present

## 2014-08-13 DIAGNOSIS — E1110 Type 2 diabetes mellitus with ketoacidosis without coma: Secondary | ICD-10-CM | POA: Diagnosis present

## 2014-08-13 LAB — I-STAT VENOUS BLOOD GAS, ED
ACID-BASE DEFICIT: 26 mmol/L — AB (ref 0.0–2.0)
Bicarbonate: 4 mEq/L — ABNORMAL LOW (ref 20.0–24.0)
O2 Saturation: 41 %
PCO2 VEN: 16.3 mmHg — AB (ref 45.0–50.0)
PO2 VEN: 34 mmHg (ref 30.0–45.0)
TCO2: <5 mmol/L (ref 0–100)
pH, Ven: 6.992 — CL (ref 7.250–7.300)

## 2014-08-13 LAB — BASIC METABOLIC PANEL
ANION GAP: 15 (ref 5–15)
BUN: 17 mg/dL (ref 6–23)
CHLORIDE: 117 mmol/L — AB (ref 96–112)
CO2: 6 mmol/L — CL (ref 19–32)
CREATININE: 1.32 mg/dL (ref 0.50–1.35)
Calcium: 8.1 mg/dL — ABNORMAL LOW (ref 8.4–10.5)
GFR calc Af Amer: 82 mL/min — ABNORMAL LOW (ref >90)
GFR, EST NON AFRICAN AMERICAN: 71 mL/min — AB (ref >90)
Glucose, Bld: 301 mg/dL — ABNORMAL HIGH (ref 70–99)
POTASSIUM: 3.9 mmol/L (ref 3.5–5.1)
Sodium: 138 mmol/L (ref 135–145)

## 2014-08-13 LAB — COMPREHENSIVE METABOLIC PANEL
ALBUMIN: 4.2 g/dL (ref 3.5–5.2)
ALT: 12 U/L (ref 0–53)
AST: 21 U/L (ref 0–37)
Alkaline Phosphatase: 90 U/L (ref 39–117)
BILIRUBIN TOTAL: 1.4 mg/dL — AB (ref 0.3–1.2)
BUN: 13 mg/dL (ref 6–23)
Calcium: 8.2 mg/dL — ABNORMAL LOW (ref 8.4–10.5)
Chloride: 106 mmol/L (ref 96–112)
Creatinine, Ser: 1.31 mg/dL (ref 0.50–1.35)
GFR calc Af Amer: 83 mL/min — ABNORMAL LOW (ref >90)
GFR calc non Af Amer: 72 mL/min — ABNORMAL LOW (ref >90)
GLUCOSE: 440 mg/dL — AB (ref 70–99)
POTASSIUM: 4.5 mmol/L (ref 3.5–5.1)
Sodium: 130 mmol/L — ABNORMAL LOW (ref 135–145)
Total Protein: 7.2 g/dL (ref 6.0–8.3)

## 2014-08-13 LAB — CBC WITH DIFFERENTIAL/PLATELET
BASOS ABS: 0 10*3/uL (ref 0.0–0.1)
Basophils Relative: 0 % (ref 0–1)
EOS ABS: 0 10*3/uL (ref 0.0–0.7)
EOS PCT: 0 % (ref 0–5)
HEMATOCRIT: 43 % (ref 39.0–52.0)
Hemoglobin: 14.7 g/dL (ref 13.0–17.0)
LYMPHS PCT: 24 % (ref 12–46)
Lymphs Abs: 2 10*3/uL (ref 0.7–4.0)
MCH: 30.4 pg (ref 26.0–34.0)
MCHC: 34.2 g/dL (ref 30.0–36.0)
MCV: 89 fL (ref 78.0–100.0)
MONO ABS: 0.7 10*3/uL (ref 0.1–1.0)
Monocytes Relative: 8 % (ref 3–12)
Neutro Abs: 5.9 10*3/uL (ref 1.7–7.7)
Neutrophils Relative %: 68 % (ref 43–77)
Platelets: 278 10*3/uL (ref 150–400)
RBC: 4.83 MIL/uL (ref 4.22–5.81)
RDW: 14.3 % (ref 11.5–15.5)
WBC: 8.6 10*3/uL (ref 4.0–10.5)

## 2014-08-13 LAB — CBG MONITORING, ED
GLUCOSE-CAPILLARY: 345 mg/dL — AB (ref 70–99)
Glucose-Capillary: 367 mg/dL — ABNORMAL HIGH (ref 70–99)
Glucose-Capillary: 573 mg/dL (ref 70–99)
Glucose-Capillary: 293 mg/dL — ABNORMAL HIGH (ref 70–99)
Glucose-Capillary: 237 mg/dL — ABNORMAL HIGH (ref 70–99)

## 2014-08-13 LAB — I-STAT TROPONIN, ED: TROPONIN I, POC: 0 ng/mL (ref 0.00–0.08)

## 2014-08-13 LAB — I-STAT CG4 LACTIC ACID, ED
LACTIC ACID, VENOUS: 2.53 mmol/L — AB (ref 0.5–2.0)
Lactic Acid, Venous: 1.15 mmol/L (ref 0.5–2.0)

## 2014-08-13 LAB — URINALYSIS, ROUTINE W REFLEX MICROSCOPIC
BILIRUBIN URINE: NEGATIVE
Leukocytes, UA: NEGATIVE
Nitrite: NEGATIVE
Protein, ur: 30 mg/dL — AB
Specific Gravity, Urine: 1.022 (ref 1.005–1.030)
Urobilinogen, UA: 0.2 mg/dL (ref 0.0–1.0)
pH: 5 (ref 5.0–8.0)

## 2014-08-13 LAB — URINE MICROSCOPIC-ADD ON

## 2014-08-13 MED ORDER — SODIUM CHLORIDE 0.9 % IV BOLUS (SEPSIS)
2000.0000 mL | Freq: Once | INTRAVENOUS | Status: AC
  Administered 2014-08-13: 2000 mL via INTRAVENOUS

## 2014-08-13 MED ORDER — SODIUM CHLORIDE 0.9 % IV SOLN
INTRAVENOUS | Status: DC | PRN
  Administered 2014-08-13: 5.1 [IU]/h via INTRAVENOUS
  Filled 2014-08-13: qty 2.5

## 2014-08-13 MED ORDER — INSULIN ASPART 100 UNIT/ML ~~LOC~~ SOLN: 0.0000 [IU] | SUBCUTANEOUS | Status: DC

## 2014-08-13 MED ORDER — INSULIN REGULAR BOLUS VIA INFUSION
0.0000 [IU] | Freq: Three times a day (TID) | INTRAVENOUS | Status: DC | PRN
  Filled 2014-08-13: qty 10

## 2014-08-13 MED ORDER — DEXTROSE-NACL 5-0.45 % IV SOLN
INTRAVENOUS | Status: AC
  Administered 2014-08-13 – 2014-08-14 (×2): via INTRAVENOUS
  Administered 2014-08-14: 125 mL/h via INTRAVENOUS

## 2014-08-13 MED ORDER — SODIUM CHLORIDE 0.9 % IV SOLN
INTRAVENOUS | Status: AC
  Administered 2014-08-13: 6.2 [IU]/h via INTRAVENOUS
  Filled 2014-08-13: qty 2.5

## 2014-08-13 MED ORDER — SODIUM CHLORIDE 0.9 % IV SOLN
INTRAVENOUS | Status: DC
  Administered 2014-08-13: 23:00:00 via INTRAVENOUS

## 2014-08-13 MED ORDER — SODIUM CHLORIDE 0.9 % IJ SOLN
3.0000 mL | Freq: Two times a day (BID) | INTRAMUSCULAR | Status: DC
  Administered 2014-08-14 (×2): 3 mL via INTRAVENOUS

## 2014-08-13 MED ORDER — ACETAMINOPHEN 325 MG PO TABS: 650.0000 mg | ORAL_TABLET | Freq: Four times a day (QID) | ORAL | Status: DC | PRN

## 2014-08-13 MED ORDER — ONDANSETRON HCL 4 MG/2ML IJ SOLN: 4.0000 mg | Freq: Four times a day (QID) | INTRAMUSCULAR | Status: DC | PRN

## 2014-08-13 MED ORDER — SODIUM CHLORIDE 0.9 % IV SOLN: INTRAVENOUS | Status: DC

## 2014-08-13 MED ORDER — POTASSIUM CHLORIDE 10 MEQ/100ML IV SOLN
10.0000 meq | INTRAVENOUS | Status: AC
Start: 2014-08-13 — End: 2014-08-14
  Administered 2014-08-13 (×2): 10 meq via INTRAVENOUS
  Filled 2014-08-13 (×2): qty 100

## 2014-08-13 MED ORDER — ACETAMINOPHEN 650 MG RE SUPP: 650.0000 mg | Freq: Four times a day (QID) | RECTAL | Status: DC | PRN

## 2014-08-13 MED ORDER — ONDANSETRON HCL 4 MG PO TABS: 4.0000 mg | ORAL_TABLET | Freq: Four times a day (QID) | ORAL | Status: DC | PRN

## 2014-08-13 MED ORDER — ENOXAPARIN SODIUM 40 MG/0.4ML ~~LOC~~ SOLN
40.0000 mg | SUBCUTANEOUS | Status: DC
  Administered 2014-08-13 – 2014-08-14 (×2): 40 mg via SUBCUTANEOUS
  Filled 2014-08-13 (×3): qty 0.4

## 2014-08-13 NOTE — ED Notes (Signed)
Hx  DMI, sts chronically hyperglycemic, also SOB, weight loss, no V/D or abd pain, A/OX4, NAD

## 2014-08-13 NOTE — ED Notes (Signed)
CBG 405 

## 2014-08-13 NOTE — ED Notes (Signed)
Blood glucose 345 

## 2014-08-13 NOTE — ED Notes (Signed)
Admitting MD at bedside.

## 2014-08-13 NOTE — ED Notes (Signed)
Nurse 1st notified of abnormal VBG

## 2014-08-13 NOTE — ED Notes (Signed)
CG-4 reported to Dr. Freida BusmanAllen.

## 2014-08-13 NOTE — H&P (Signed)
[facility] History and Physical  Tamaj Jurgens HEN:[id number] DOB: 1986-03-10 DOA: 08/13/2014   PCP: Minerva Ends, MD  Specialists: None  Chief Complaint: High blood sugars  HPI: [name] [name] is a 29 y.o. male with a past medical history of type 1 diabetes mellitus who was hospitalized in February for diabetic ketoacidosis. There are notes, which suggests that he is noncompliant with his diabetic regimen. Patient presented today with complaints of high blood sugar, which has been ongoing for about a week and a half. Patient tells me that he's been running out of his insulin 70/30, so has been taking less insulin than he should be. And then his blood sugars have been running in the 300s to 400s. He decided to come in today for evaluation. Patient has noted some shortness of breath but no cough, fever, wheezing. Nausea, vomiting. Denies any fever, chills. No sick contacts. No dizziness, lightheadedness. No diarrhea. Denies any abdominal pain.  Home Medications: Prior to Admission medications   Medication Sig Start Date End Date Taking? Authorizing Provider  atorvastatin (LIPITOR) 20 MG tablet Take 2 tablets (40 mg total) by mouth daily at 6 PM. 07/02/14  Yes Allie Bossier, MD  insulin aspart (NOVOLOG) 100 UNIT/ML injection Inject 0-9 Units into the skin 3 (three) times daily with meals. Patient taking differently: Inject 0-9 Units into the skin 3 (three) times daily with meals. Sliding scale 07/02/14  Yes Allie Bossier, MD  insulin aspart protamine- aspart (NOVOLOG MIX 70/30) (70-30) 100 UNIT/ML injection Inject 0.3 mLs (30 Units total) into the skin 2 (two) times daily with a meal. 07/20/14  Yes Oswald Hillock, MD  clindamycin (CLEOCIN) 300 MG capsule Take 1 capsule (300 mg total) by mouth 3 (three) times daily. 07/20/14   Oswald Hillock, MD  HYDROcodone-acetaminophen (NORCO/VICODIN) 5-325 MG per tablet Take 1 tablet by mouth every 6 (six) hours as needed for moderate pain. 07/20/14    Oswald Hillock, MD  NEEDLE, DISP, 22 G (BD DISP NEEDLES) 22G X 1-1/2" MISC 5 Units by Does not apply route 4 (four) times daily -  with meals and at bedtime. 07/02/14   Allie Bossier, MD  Syringe, Disposable, 1 ML MISC 5 Units by Does not apply route 4 (four) times daily -  with meals and at bedtime. 07/02/14   Allie Bossier, MD  TRUEPLUS LANCETS 28G MISC 1 each by Does not apply route 3 (three) times daily. Patient not taking: Reported on 06/30/2014 05/04/14   [name], MD    Allergies: No Known Allergies  Past Medical History: Past Medical History  Diagnosis Date  . Diabetes mellitus without complication Dx 3614  . Hypertension Dx 2015  . Renal insufficiency 2011    pt was on dialysis for a short time approx 2011, pt is no longer on dialysis    History reviewed. No pertinent past surgical history.  Social History: Patient lives in [place] with his mother and brother. No history of smoking or alcohol use. Denies any recreational drug use. Independent with daily activities.  Family History:  Family History  Problem Relation Age of Onset  . Diabetes Father   . Diabetes Maternal Grandmother   . Heart disease Neg Hx   . Hypertension Neg Hx      Review of Systems - History obtained from the patient General ROS: positive for  - fatigue Psychological ROS: negative Ophthalmic ROS: negative ENT ROS: negative Allergy and Immunology ROS: negative Hematological and Lymphatic ROS: negative  Endocrine ROS: as in hpi Respiratory ROS: as in hpi Cardiovascular ROS: no chest pain or dyspnea on exertion Gastrointestinal ROS: no abdominal pain, change in bowel habits, or black or bloody stools Genito-Urinary ROS: no dysuria, trouble voiding, or hematuria Musculoskeletal ROS: negative Neurological ROS: no TIA or stroke symptoms Dermatological ROS: negative  Physical Examination  Filed Vitals:   08/13/14 1451 08/13/14 1730 08/13/14 1745  BP: 137/80 150/87 147/85  Pulse: 111 115  123  Temp: 98.4 F (36.9 C)    TempSrc: Oral    Resp: $Remo'26 25 28  'isVFA$ Height: $Rem'5\' 6"'qBEv$  (1.676 m)    Weight: 50.939 kg (112 lb 4.8 oz)    SpO2: 100% 100% 100%    BP 147/85 mmHg  Pulse 123  Temp(Src) 98.4 F (36.9 C) (Oral)  Resp 28  Ht $R'5\' 6"'PI$  (1.676 m)  Wt 50.939 kg (112 lb 4.8 oz)  BMI 18.13 kg/m2  SpO2 100%  General appearance: alert, cooperative, appears stated age and no distress Head: Normocephalic, without obvious abnormality, atraumatic Eyes: conjunctivae/corneas clear. PERRL, EOM's intact. Throat: dry mucus membranes Neck: no adenopathy, no carotid bruit, no JVD, supple, symmetrical, trachea midline and thyroid not enlarged, symmetric, no tenderness/mass/nodules Resp: clear to auscultation bilaterally Cardio: regular rate and rhythm, S1, S2 normal, no murmur, click, rub or gallop GI: soft, non-tender; bowel sounds normal; no masses,  no organomegaly Extremities: extremities normal, atraumatic, no cyanosis or edema Pulses: 2+ and symmetric Skin: Skin color, texture, turgor normal. No rashes or lesions Lymph nodes: Cervical, supraclavicular, and axillary nodes normal. Neurologic: A and Ox3. No focal deficits.  Laboratory Data: Results for orders placed or performed during the hospital encounter of 08/13/14 (from the past 48 hour(s))  CBG monitoring, ED     Status: Abnormal   Collection Time: 08/13/14  2:46 PM  Result Value Ref Range   Glucose-Capillary 367 (H) 70 - 99 mg/dL   Comment 1 Notify RN    Comment 2 Document in Chart   CBC with Differential     Status: None   Collection Time: 08/13/14  3:18 PM  Result Value Ref Range   WBC 8.6 4.0 - 10.5 K/uL   RBC 4.83 4.22 - 5.81 MIL/uL   Hemoglobin 14.7 13.0 - 17.0 g/dL   HCT 43.0 39.0 - 52.0 %   MCV 89.0 78.0 - 100.0 fL   MCH 30.4 26.0 - 34.0 pg   MCHC 34.2 30.0 - 36.0 g/dL   RDW 14.3 11.5 - 15.5 %   Platelets 278 150 - 400 K/uL   Neutrophils Relative % 68 43 - 77 %   Neutro Abs 5.9 1.7 - 7.7 K/uL   Lymphocytes Relative  24 12 - 46 %   Lymphs Abs 2.0 0.7 - 4.0 K/uL   Monocytes Relative 8 3 - 12 %   Monocytes Absolute 0.7 0.1 - 1.0 K/uL   Eosinophils Relative 0 0 - 5 %   Eosinophils Absolute 0.0 0.0 - 0.7 K/uL   Basophils Relative 0 0 - 1 %   Basophils Absolute 0.0 0.0 - 0.1 K/uL  Comprehensive metabolic panel     Status: Abnormal   Collection Time: 08/13/14  3:18 PM  Result Value Ref Range   Sodium 130 (L) 135 - 145 mmol/L   Potassium 4.5 3.5 - 5.1 mmol/L   Chloride 106 96 - 112 mmol/L   CO2 <5 (LL) 19 - 32 mmol/L    Comment: REPEATED TO VERIFY CRITICAL RESULT CALLED TO, READ BACK BY AND VERIFIED  WITH: J GAGE,RN 03.24.16 1644 M SHIPMAN    Glucose, Bld 440 (H) 70 - 99 mg/dL   BUN 13 6 - 23 mg/dL   Creatinine, Ser 1.31 0.50 - 1.35 mg/dL   Calcium 8.2 (L) 8.4 - 10.5 mg/dL   Total Protein 7.2 6.0 - 8.3 g/dL   Albumin 4.2 3.5 - 5.2 g/dL   AST 21 0 - 37 U/L   ALT 12 0 - 53 U/L   Alkaline Phosphatase 90 39 - 117 U/L   Total Bilirubin 1.4 (H) 0.3 - 1.2 mg/dL   GFR calc non Af Amer 72 (L) >90 mL/min   GFR calc Af Amer 83 (L) >90 mL/min    Comment: (NOTE) The eGFR has been calculated using the CKD EPI equation. This calculation has not been validated in all clinical situations. eGFR's persistently <90 mL/min signify possible Chronic Kidney Disease.    Anion gap NOT CALCULATED 5 - 15  I-stat troponin, ED     Status: None   Collection Time: 08/13/14  3:44 PM  Result Value Ref Range   Troponin i, poc 0.00 0.00 - 0.08 ng/mL   Comment 3            Comment: Due to the release kinetics of cTnI, a negative result within the first hours of the onset of symptoms does not rule out myocardial infarction with certainty. If myocardial infarction is still suspected, repeat the test at appropriate intervals.   I-Stat CG4 Lactic Acid, ED     Status: None   Collection Time: 08/13/14  3:46 PM  Result Value Ref Range   Lactic Acid, Venous 1.15 0.5 - 2.0 mmol/L  I-Stat venous blood gas, ED     Status: Abnormal    Collection Time: 08/13/14  3:46 PM  Result Value Ref Range   pH, Ven 6.992 (LL) 7.250 - 7.300   pCO2, Ven 16.3 (L) 45.0 - 50.0 mmHg   pO2, Ven 34.0 30.0 - 45.0 mmHg   Bicarbonate 4.0 (L) 20.0 - 24.0 mEq/L   TCO2 <5 0 - 100 mmol/L   O2 Saturation 41.0 %   Acid-base deficit 26.0 (H) 0.0 - 2.0 mmol/L   Collection site BRACHIAL ARTERY    Sample type VENOUS    Comment NOTIFIED PHYSICIAN   CBG monitoring, ED     Status: Abnormal   Collection Time: 08/13/14  5:26 PM  Result Value Ref Range   Glucose-Capillary 573 (HH) 70 - 99 mg/dL    Radiology Reports: Dg Chest 2 View  08/13/2014   CLINICAL DATA:  Shortness of breath.  EXAM: CHEST  2 VIEW  COMPARISON:  07/05/2014  FINDINGS: The heart size and mediastinal contours are within normal limits. Both lungs are clear. The visualized skeletal structures are unremarkable.  IMPRESSION: Normal exam.   Electronically Signed   By: Lorriane Shire M.D.   On: 08/13/2014 16:19    Electrocardiogram: Sinus tachycardia at 110 bpm. Normal axis. Intervals are normal. No Q waves. No concerning ST or T-wave changes are noted.  Problem List  Principal Problem:   DKA (diabetic ketoacidoses) Active Problems:   DM (diabetes mellitus), type 1, uncontrolled   Assessment: This is a 29 year old African-American male with Type 1 Diabetes who presents with hyperglycemia and is noted to have diabetic ketoacidosis. Anion gap is at least 24. He has been running out of his long-acting insulin. This presentation is most likely secondary to noncompliance. No other precipitant event or reason is identified.  Plan: #1 Severe Diabetic  ketoacidosis: He'll be admitted to the stepdown unit. He'll be placed on intravenous insulin per protocol. Basic metabolic panels will be repeated. He will be aggressively hydrated. His HbA1c back in February was 11.8. He'll be kept nothing by mouth for now. Check UA. Lactic acid level is normal. Chest x-ray was unremarkable.   DVT  Prophylaxis: Lovenox Code Status: Full code Family Communication: Discussed with the patient. No family at bedside  Disposition Plan: Admit to stepdown.   Further management decisions will depend on results of further testing and patient's response to treatment.   [facility]  [facility] Pager [phone]  If 7PM-7AM, please contact night-coverage [email or web address] Password [facility]  08/13/2014, 6:07 PM

## 2014-08-13 NOTE — ED Provider Notes (Signed)
CSN: [id number]     Arrival date & time 08/13/14  1436 History   First MD Initiated Contact with Patient 08/13/14 1653     Chief Complaint  Patient presents with  . Hyperglycemia  . Shortness of Breath     (Consider location/radiation/quality/duration/timing/severity/associated sxs/prior Treatment) HPI Comments: Patient here of polyuria and polydipsia times several days with elevated blood sugars at home. Blood sugars have been read as critical high on his glucometer. He has not been compliant with his daily regimen of insulin. He does have type 1 diabetes. Has been DKA before in the past and this feels similar. Denies any nausea vomiting. Denies abdominal chest pain. Does feel weakness and also endorses weight loss. Symptoms persistent and nothing makes them better.  Patient is a 29 y.o. male presenting with hyperglycemia and shortness of breath. The history is provided by the patient.  Hyperglycemia Associated symptoms: shortness of breath   Shortness of Breath   Past Medical History  Diagnosis Date  . Diabetes mellitus without complication Dx 2012  . Hypertension Dx 2015  . Renal insufficiency 2011    pt was on dialysis for a short time approx 2011, pt is no longer on dialysis   History reviewed. No pertinent past surgical history. Family History  Problem Relation Age of Onset  . Diabetes Father   . Diabetes Maternal Grandmother   . Heart disease Neg Hx   . Hypertension Neg Hx    History  Substance Use Topics  . Smoking status: Former Smoker    Quit date: 09/19/2013  . Smokeless tobacco: Never Used  . Alcohol Use: No    Review of Systems  Respiratory: Positive for shortness of breath.   All other systems reviewed and are negative.     Allergies  Review of patient's allergies indicates no known allergies.  Home Medications   Prior to Admission medications   Medication Sig Start Date End Date Taking? Authorizing Provider  atorvastatin (LIPITOR) 20 MG tablet  Take 2 tablets (40 mg total) by mouth daily at 6 PM. 07/02/14   Drema Dallas, MD  clindamycin (CLEOCIN) 300 MG capsule Take 1 capsule (300 mg total) by mouth 3 (three) times daily. 07/20/14   Meredeth Ide, MD  HYDROcodone-acetaminophen (NORCO/VICODIN) 5-325 MG per tablet Take 1 tablet by mouth every 6 (six) hours as needed for moderate pain. 07/20/14   Meredeth Ide, MD  insulin aspart (NOVOLOG) 100 UNIT/ML injection Inject 0-9 Units into the skin 3 (three) times daily with meals. Patient taking differently: Inject 0-9 Units into the skin 3 (three) times daily with meals. Sliding scale 07/02/14   Drema Dallas, MD  insulin aspart protamine- aspart (NOVOLOG MIX 70/30) (70-30) 100 UNIT/ML injection 28 units with breakfast 28 units with dinner Patient taking differently: Inject 28 Units into the skin 2 (two) times daily with a meal. 28 units with breakfast 28 units with dinner 07/02/14   Drema Dallas, MD  insulin aspart protamine- aspart (NOVOLOG MIX 70/30) (70-30) 100 UNIT/ML injection Inject 0.3 mLs (30 Units total) into the skin 2 (two) times daily with a meal. 07/20/14   Meredeth Ide, MD  NEEDLE, DISP, 22 G (BD DISP NEEDLES) 22G X 1-1/2" MISC 5 Units by Does not apply route 4 (four) times daily -  with meals and at bedtime. 07/02/14   Drema Dallas, MD  Syringe, Disposable, 1 ML MISC 5 Units by Does not apply route 4 (four) times daily -  with meals  and at bedtime. 07/02/14   Drema Dallas, MD  TRUEPLUS LANCETS 28G MISC 1 each by Does not apply route 3 (three) times daily. Patient not taking: Reported on 06/30/2014 05/04/14   Josalyn Funches, MD   BP 137/80 mmHg  Pulse 111  Temp(Src) 98.4 F (36.9 C) (Oral)  Resp 26  Ht  (1.676 m)  Wt 112 lb 4.8 oz (50.939 kg)  BMI 18.13 kg/m2  SpO2 100% Physical Exam  Constitutional: He is oriented to person, place, and time. He appears well-developed and well-nourished.  Non-toxic appearance. No distress.  HENT:  Head: Normocephalic and atraumatic.   Eyes: Conjunctivae, EOM and lids are normal. Pupils are equal, round, and reactive to light.  Neck: Normal range of motion. Neck supple. No tracheal deviation present. No thyroid mass present.  Cardiovascular: Regular rhythm and normal heart sounds.  Tachycardia present.  Exam reveals no gallop.   No murmur heard. Pulmonary/Chest: Effort normal and breath sounds normal. No stridor. No respiratory distress. He has no decreased breath sounds. He has no wheezes. He has no rhonchi. He has no rales.  Abdominal: Soft. Normal appearance and bowel sounds are normal. He exhibits no distension. There is no tenderness. There is no rebound and no CVA tenderness.  Musculoskeletal: Normal range of motion. He exhibits no edema or tenderness.  Neurological: He is alert and oriented to person, place, and time. He has normal strength. No cranial nerve deficit or sensory deficit. GCS eye subscore is 4. GCS verbal subscore is 5. GCS motor subscore is 6.  Skin: Skin is warm and dry. No abrasion and no rash noted.  Psychiatric: He has a normal mood and affect. His speech is normal and behavior is normal.  Nursing note and vitals reviewed.   ED Course  Procedures (including critical care time) Labs Review Labs Reviewed  COMPREHENSIVE METABOLIC PANEL - Abnormal; Notable for the following:    Sodium 130 (*)    CO2 <5 (*)    Glucose, Bld 440 (*)    Calcium 8.2 (*)    Total Bilirubin 1.4 (*)    GFR calc non Af Amer 72 (*)    GFR calc Af Amer 83 (*)    All other components within normal limits  CBG MONITORING, ED - Abnormal; Notable for the following:    Glucose-Capillary 367 (*)    All other components within normal limits  I-STAT VENOUS BLOOD GAS, ED - Abnormal; Notable for the following:    pH, Ven 6.992 (*)    pCO2, Ven 16.3 (*)    Bicarbonate 4.0 (*)    Acid-base deficit 26.0 (*)    All other components within normal limits  CBC WITH DIFFERENTIAL/PLATELET  BLOOD GAS, VENOUS  I-STAT CG4 LACTIC ACID,  ED  Rosezena Sensor, ED    Imaging Review Dg Chest 2 View  08/13/2014   CLINICAL DATA:  Shortness of breath.  EXAM: CHEST  2 VIEW  COMPARISON:  07/05/2014  FINDINGS: The heart size and mediastinal contours are within normal limits. Both lungs are clear. The visualized skeletal structures are unremarkable.  IMPRESSION: Normal exam.   Electronically Signed   By: Francene Boyers M.D.   On: 08/13/2014 16:19     EKG Interpretation   Date/Time:  Thursday August 13 2014 14:44:31 EDT Ventricular Rate:  113 PR Interval:  140 QRS Duration: 74 QT Interval:  314 QTC Calculation: 430 R Axis:   90 Text Interpretation:  Sinus tachycardia Rightward axis Septal infarct ,  age  undetermined Abnormal ECG No significant change since last tracing  Confirmed by Saory Carriero  MD, Ender ([id number]) on 08/13/2014 4:55:55 PM      MDM   Final diagnoses:  None    Patient's laboratory studies consistent with diabetic ketoacidosis. Will be hydrated with saline aggressively and started on glucometer. Will be admitted to step down    Lorre NickAnthony Corwyn Vora, MD 08/13/14 1715

## 2014-08-13 NOTE — ED Notes (Signed)
Romeo AppleBen, RN aware of glucose results

## 2014-08-14 DIAGNOSIS — E876 Hypokalemia: Secondary | ICD-10-CM

## 2014-08-14 LAB — GLUCOSE, CAPILLARY
GLUCOSE-CAPILLARY: 123 mg/dL — AB (ref 70–99)
GLUCOSE-CAPILLARY: 123 mg/dL — AB (ref 70–99)
GLUCOSE-CAPILLARY: 130 mg/dL — AB (ref 70–99)
GLUCOSE-CAPILLARY: 178 mg/dL — AB (ref 70–99)
GLUCOSE-CAPILLARY: 183 mg/dL — AB (ref 70–99)
GLUCOSE-CAPILLARY: 405 mg/dL — AB (ref 70–99)
Glucose-Capillary: 157 mg/dL — ABNORMAL HIGH (ref 70–99)
Glucose-Capillary: 137 mg/dL — ABNORMAL HIGH (ref 70–99)
Glucose-Capillary: 135 mg/dL — ABNORMAL HIGH (ref 70–99)
Glucose-Capillary: 146 mg/dL — ABNORMAL HIGH (ref 70–99)
Glucose-Capillary: 146 mg/dL — ABNORMAL HIGH (ref 70–99)
Glucose-Capillary: 144 mg/dL — ABNORMAL HIGH (ref 70–99)
Glucose-Capillary: 147 mg/dL — ABNORMAL HIGH (ref 70–99)
Glucose-Capillary: 134 mg/dL — ABNORMAL HIGH (ref 70–99)
Glucose-Capillary: 132 mg/dL — ABNORMAL HIGH (ref 70–99)
Glucose-Capillary: 153 mg/dL — ABNORMAL HIGH (ref 70–99)
Glucose-Capillary: 176 mg/dL — ABNORMAL HIGH (ref 70–99)
Glucose-Capillary: 180 mg/dL — ABNORMAL HIGH (ref 70–99)
Glucose-Capillary: 187 mg/dL — ABNORMAL HIGH (ref 70–99)
Glucose-Capillary: 168 mg/dL — ABNORMAL HIGH (ref 70–99)
Glucose-Capillary: 289 mg/dL — ABNORMAL HIGH (ref 70–99)
Glucose-Capillary: 100 mg/dL — ABNORMAL HIGH (ref 70–99)
Glucose-Capillary: 117 mg/dL — ABNORMAL HIGH (ref 70–99)

## 2014-08-14 LAB — BASIC METABOLIC PANEL
ANION GAP: 6 (ref 5–15)
ANION GAP: 6 (ref 5–15)
Anion gap: 12 (ref 5–15)
Anion gap: 9 (ref 5–15)
BUN: 17 mg/dL (ref 6–23)
BUN: 10 mg/dL (ref 6–23)
BUN: 13 mg/dL (ref 6–23)
BUN: 17 mg/dL (ref 6–23)
CALCIUM: 7.9 mg/dL — AB (ref 8.4–10.5)
CALCIUM: 8 mg/dL — AB (ref 8.4–10.5)
CALCIUM: 8 mg/dL — AB (ref 8.4–10.5)
CHLORIDE: 118 mmol/L — AB (ref 96–112)
CHLORIDE: 115 mmol/L — AB (ref 96–112)
CHLORIDE: 113 mmol/L — AB (ref 96–112)
CO2: 8 mmol/L — CL (ref 19–32)
CO2: 10 mmol/L — CL (ref 19–32)
CO2: 16 mmol/L — ABNORMAL LOW (ref 19–32)
CO2: 11 mmol/L — AB (ref 19–32)
CREATININE: 0.94 mg/dL (ref 0.50–1.35)
Calcium: 8.2 mg/dL — ABNORMAL LOW (ref 8.4–10.5)
Chloride: 117 mmol/L — ABNORMAL HIGH (ref 96–112)
Creatinine, Ser: 1.17 mg/dL (ref 0.50–1.35)
Creatinine, Ser: 0.79 mg/dL (ref 0.50–1.35)
Creatinine, Ser: 0.74 mg/dL (ref 0.50–1.35)
GFR calc Af Amer: >90 mL/min (ref >90)
GFR calc Af Amer: >90 mL/min (ref >90)
GFR calc non Af Amer: >90 mL/min (ref >90)
GFR calc non Af Amer: >90 mL/min (ref >90)
GFR, EST NON AFRICAN AMERICAN: 82 mL/min — AB (ref >90)
Glucose, Bld: 186 mg/dL — ABNORMAL HIGH (ref 70–99)
Glucose, Bld: 134 mg/dL — ABNORMAL HIGH (ref 70–99)
Glucose, Bld: 163 mg/dL — ABNORMAL HIGH (ref 70–99)
Glucose, Bld: 116 mg/dL — ABNORMAL HIGH (ref 70–99)
Potassium: 3.2 mmol/L — ABNORMAL LOW (ref 3.5–5.1)
Potassium: 3.1 mmol/L — ABNORMAL LOW (ref 3.5–5.1)
Potassium: 2.7 mmol/L — CL (ref 3.5–5.1)
Potassium: 3 mmol/L — ABNORMAL LOW (ref 3.5–5.1)
SODIUM: 135 mmol/L (ref 135–145)
SODIUM: 137 mmol/L (ref 135–145)
Sodium: 135 mmol/L (ref 135–145)
Sodium: 134 mmol/L — ABNORMAL LOW (ref 135–145)

## 2014-08-14 LAB — CBC
HCT: 36.1 % — ABNORMAL LOW (ref 39.0–52.0)
HEMOGLOBIN: 12.7 g/dL — AB (ref 13.0–17.0)
MCH: 29.9 pg (ref 26.0–34.0)
MCHC: 35.2 g/dL (ref 30.0–36.0)
MCV: 84.9 fL (ref 78.0–100.0)
Platelets: 167 10*3/uL (ref 150–400)
RBC: 4.25 MIL/uL (ref 4.22–5.81)
RDW: 14.2 % (ref 11.5–15.5)
WBC: 8.7 10*3/uL (ref 4.0–10.5)

## 2014-08-14 LAB — MAGNESIUM: Magnesium: 1.7 mg/dL (ref 1.5–2.5)

## 2014-08-14 LAB — MRSA PCR SCREENING: MRSA BY PCR: NEGATIVE

## 2014-08-14 MED ORDER — INSULIN ASPART PROT & ASPART (70-30 MIX) 100 UNIT/ML ~~LOC~~ SUSP
30.0000 [IU] | Freq: Two times a day (BID) | SUBCUTANEOUS | Status: DC
Start: 2014-08-14 — End: 2014-08-15
  Administered 2014-08-14 – 2014-08-15 (×2): 30 [IU] via SUBCUTANEOUS
  Filled 2014-08-14: qty 10

## 2014-08-14 MED ORDER — POTASSIUM CHLORIDE 10 MEQ/100ML IV SOLN
10.0000 meq | INTRAVENOUS | Status: AC
Start: 2014-08-14 — End: 2014-08-14
  Administered 2014-08-14 (×4): 10 meq via INTRAVENOUS
  Filled 2014-08-14: qty 100

## 2014-08-14 MED ORDER — POTASSIUM CHLORIDE CRYS ER 20 MEQ PO TBCR
40.0000 meq | EXTENDED_RELEASE_TABLET | Freq: Once | ORAL | Status: AC
  Administered 2014-08-14: 40 meq via ORAL
  Filled 2014-08-14: qty 2

## 2014-08-14 MED ORDER — INSULIN ASPART 100 UNIT/ML ~~LOC~~ SOLN
0.0000 [IU] | Freq: Three times a day (TID) | SUBCUTANEOUS | Status: DC
  Administered 2014-08-15: 3 [IU] via SUBCUTANEOUS

## 2014-08-14 MED ORDER — POTASSIUM CHLORIDE CRYS ER 20 MEQ PO TBCR
40.0000 meq | EXTENDED_RELEASE_TABLET | ORAL | Status: AC
  Administered 2014-08-14 (×2): 40 meq via ORAL
  Filled 2014-08-14 (×2): qty 2

## 2014-08-14 MED ORDER — INSULIN ASPART 100 UNIT/ML ~~LOC~~ SOLN: 0.0000 [IU] | Freq: Every day | SUBCUTANEOUS | Status: DC

## 2014-08-14 MED ORDER — GLUCERNA SHAKE PO LIQD: 237.0000 mL | Freq: Three times a day (TID) | ORAL | Status: DC

## 2014-08-14 MED ORDER — POTASSIUM CHLORIDE CRYS ER 20 MEQ PO TBCR
60.0000 meq | EXTENDED_RELEASE_TABLET | Freq: Once | ORAL | Status: AC
  Administered 2014-08-14: 60 meq via ORAL
  Filled 2014-08-14: qty 3

## 2014-08-14 NOTE — Progress Notes (Signed)
CRITICAL VALUE ALERT  Critical value received:  C02 10 and K+ of 2.7  Date of notification:  08/14/14  Time of notification:  1245  Critical value read back:Yes.    Nurse who received alert:  Willeen Nieceebecca Elgene Coral, RN  MD notified (1st page):  Rito EhrlichKrishnan  Time of first page:  1247  MD notified (2nd page):  Time of second page:  Responding MD:    Time MD responded:

## 2014-08-14 NOTE — Progress Notes (Signed)
Spoke with patient about his diabetes.  Has been on insulin for about 2 years.  Takes 70/30 insulin 30 units BID and Novolog SENSITIVE correction scale TID at home. States that he ran out of Novolog about 2 days ago and did not call [facility] to have the insulin refilled.  Was not feeling well at that time. States that [facility]  Suggested that he always take his insulin and make sure that he has enough. Ask the clinic what to do if blood sugars greater than 300 mg/dl at home, who he should call, when to go to the ED, etc. States that he has a sliding scale to follow if blood sugars are greater than 300 mg/dl. Remains on glucostabilizer at this time. Will continue to follow while in hospital. Smith MinceKendra Lauriana Denes RN BSN CDE

## 2014-08-14 NOTE — Progress Notes (Addendum)
[facility] PROGRESS NOTE  [name] [name] [id number] DOB: 10/29/1985 DOA: 08/13/2014  PCP: Lora PaulaFUNCHES, JOSALYN C, MD  Brief HPI: 29 year old African-American male with a past medical history of type 1 diabetes mellitus with known noncompliance. He presented yesterday with hyperglycemia and was noted to have diabetic ketoacidosis. He was placed on an insulin infusion and admitted to stepdown unit.  Past medical history:  Past Medical History  Diagnosis Date  . Diabetes mellitus without complication Dx 2012  . Hypertension Dx 2015  . Renal insufficiency 2011    pt was on dialysis for a short time approx 2011, pt is no longer on dialysis    Consultants: None  Procedures: None  Antibiotics: None  Subjective: Patient feels better this morning. He is no longer short of breath. Denies any nausea, vomiting or abdominal pain.  Objective: Vital Signs  Filed Vitals:   08/14/14 0418 08/14/14 0420 08/14/14 0715 08/14/14 0740  BP: 127/76  132/76   Pulse: 90  91   Temp: 97.5 F (36.4 C)   100.1 F (37.8 C)  TempSrc: Oral   Oral  Resp: 17     Height:      Weight:  50.2 kg (110 lb 10.7 oz)    SpO2: 100%  99%     Intake/Output Summary (Last 24 hours) at 08/14/14 1029 Last data filed at 08/14/14 0800  Gross per 24 hour  Intake 1382.68 ml  Output   1650 ml  Net -267.32 ml   Filed Weights   08/13/14 1451 08/13/14 2322 08/14/14 0420  Weight: 50.939 kg (112 lb 4.8 oz) 49.5 kg (109 lb 2 oz) 50.2 kg (110 lb 10.7 oz)    General appearance: alert, cooperative, appears stated age and no distress Head: Normocephalic, without obvious abnormality, atraumatic Resp: clear to auscultation bilaterally Cardio: regular rate and rhythm, S1, S2 normal, no murmur, click, rub or gallop GI: soft, non-tender; bowel sounds normal; no masses,  no organomegaly Extremities: extremities normal, atraumatic, no cyanosis or edema Neurologic: Alert and oriented 3. No focal neurological deficits  are noted.  Lab Results:  Basic Metabolic Panel:  Recent Labs Lab 08/13/14 1518 08/13/14 2139 08/14/14 0030 08/14/14 0356  NA 130* 138 137 135  K 4.5 3.9 3.2* 3.1*  CL 106 117* 117* 118*  CO2 <5* 6* 8* 11*  GLUCOSE 440* 301* 186* 134*  BUN 13 17 17 17   CREATININE 1.31 1.32 1.17 0.94  CALCIUM 8.2* 8.1* 8.0* 7.9*   Liver Function Tests:  Recent Labs Lab 08/13/14 1518  AST 21  ALT 12  ALKPHOS 90  BILITOT 1.4*  PROT 7.2  ALBUMIN 4.2   CBC:  Recent Labs Lab 08/13/14 1518 08/14/14 0356  WBC 8.6 8.7  NEUTROABS 5.9  --   HGB 14.7 12.7*  HCT 43.0 36.1*  MCV 89.0 84.9  PLT 278 167   CBG:  Recent Labs Lab 08/14/14 0600 08/14/14 0652 08/14/14 0800 08/14/14 0855 08/14/14 0959  GLUCAP 146* 144* 147* 134* 132*    Recent Results (from the past 240 hour(s))  MRSA PCR Screening     Status: None   Collection Time: 08/13/14 11:30 PM  Result Value Ref Range Status   MRSA by PCR NEGATIVE NEGATIVE Final    Comment:        The GeneXpert MRSA Assay (FDA approved for NASAL specimens only), is one component of a comprehensive MRSA colonization surveillance program. It is not intended to diagnose MRSA infection nor to guide or monitor treatment for MRSA  infections.       Studies/Results: Dg Chest 2 View  08/13/2014   CLINICAL DATA:  Shortness of breath.  EXAM: CHEST  2 VIEW  COMPARISON:  07/05/2014  FINDINGS: The heart size and mediastinal contours are within normal limits. Both lungs are clear. The visualized skeletal structures are unremarkable.  IMPRESSION: Normal exam.   Electronically Signed   By: Francene Boyers M.D.   On: 08/13/2014 16:19    Medications:  Scheduled: . enoxaparin (LOVENOX) injection  40 mg Subcutaneous Q24H  . sodium chloride  3 mL Intravenous Q12H   Continuous: . sodium chloride Stopped (08/13/14 2303)  . dextrose 5 % and 0.45% NaCl 125 mL/hr at 08/14/14 0656  . insulin (NOVOLIN-R) infusion 2.6 mL/hr at 08/14/14 0800    ZOX:[id number] **OR** acetaminophen, ondansetron **OR** ondansetron (ZOFRAN) IV  Assessment/Plan:  Principal Problem:   DKA (diabetic ketoacidoses) Active Problems:   DM (diabetes mellitus), type 1, uncontrolled    Severe Diabetic ketoacidosis in the setting of known type 1 diabetes Anion gap closed. Bicarbonate, however, continues to remain low. Basic metabolic panel from this morning is still pending. Patient is symptomatically much improved. Anticipate we'll be able to transition him off of IV insulin to his long-acting insulin 70/30, later today. Reason for DKA is noncompliance. No infection has been identified. UA did not suggest infection. Lactic acid level is normal. Chest x-ray was unremarkable.  Hypokalemia Will be repleted aggressively. Check magnesium level.  ADDENDUM Bmet at 6Pm showed improvement in AG and bicarb. Will transition to SQ insulin. Continue to replete K.  DVT Prophylaxis: Lovenox    Code Status: Full code  Family Communication: Discussed with the patient  Disposition Plan: Not ready for discharge    LOS: 1 day   [facility]  [facility] Pager [phone] 08/14/2014, 10:29 AM  If 7PM-7AM, please contact night-coverage at [email or web address], password [facility]

## 2014-08-14 NOTE — Progress Notes (Signed)
INITIAL NUTRITION ASSESSMENT  DOCUMENTATION CODES Per approved criteria  -Severe malnutrition in the context of chronic illness   INTERVENTION: Glucerna Shake po TID, each supplement provides 220 kcal and 10 grams of protein once diet is advanced  NUTRITION DIAGNOSIS: Malnutrition related to chronic disease as evidenced by severe fat and muscle depletion.   Goal: Pt to meet >/= 90% of their estimated nutrition needs   Monitor:  PO intake, weight trends, labs  Reason for Assessment:  Pt identified as at nutrition risk on the Malnutrition Screen Tool  29 y.o. male    Admitting Dx: DKA (diabetic ketoacidoses)  ASSESSMENT:  Pt is a 29 y.o. male with a past medical history of type 1 diabetes mellitus who was hospitalized in February for diabetic ketoacidosis. There are notes, which suggests that he is noncompliant with his diabetic regimen.  Pt was resting comfortably during visit. Is still NPO. Weight history shows a 8 Lb weight gain since last hospitalization (07/17/14). Pt reports that his appetite varies and is poor during the times when he is not feeling well (due to ketoacidosis). Drinks Glucerna with every meal at home. Diet recall shows that he eats 3 meals a day, consisting of fruits, vegetables and other healthy options. Pt reveals that his biggest barrier to his diabetes control is not being able to afford his insulin at all times. Spoke to case manager who will follow up with the [facility] and [facility].  Labs reviewed: Glu (132) and trending down due to insulin drip, Ca 7.9, CO2 11, K 3.1, CL 118  Nutrition Focused Physical Exam:  Subcutaneous Fat:  Orbital Region: mild depletion  Upper Arm Region: mild to moderate depletion Thoracic and Lumbar Region: severe depletion  Muscle:  Temple Region: moderate depletion Clavicle Bone Region: moderate depletion Clavicle and Acromion Bone Region: mild to moderate depletion Scapular Bone Region: mild  to moderate depletion Dorsal Hand: mild to moderate depletion Patellar Region: mild to moderate depletion Anterior Thigh Region: moderate depletion Posterior Calf Region: Severe depletion  Edema: none noted  Height: Ht Readings from Last 1 Encounters:  08/13/14  (1.676 m)    Weight: Wt Readings from Last 1 Encounters:  08/14/14 110 lb 10.7 oz (50.2 kg)    Ideal Body Weight: 142 Lb  % Ideal Body Weight: 77 %  Wt Readings from Last 10 Encounters:  08/14/14 110 lb 10.7 oz (50.2 kg)  07/17/14 102 lb 15.3 oz (46.7 kg)  06/30/14 110 lb 10.7 oz (50.2 kg)  05/01/14 114 lb (51.71 kg)  04/24/14 130 lb (58.968 kg)  07/07/13 110 lb 7.2 oz (50.1 kg)  03/21/12 126 lb 4.8 oz (57.289 kg)    Usual Body Weight: 115 Lb  % Usual Body Weight: 96%  BMI:  Body mass index is 17.87 kg/(m^2). Underweight  Estimated Nutritional Needs: Kcal: 1800-2000 Protein: 70-80 g Fluid: 1.8 - 2.0 L/day  Skin: WDL  Diet Order: Diet NPO time specified Except for: Ice Chips  EDUCATION NEEDS: None identified at this time   Intake/Output Summary (Last 24 hours) at 08/14/14 1013 Last data filed at 08/14/14 0800  Gross per 24 hour  Intake 1382.68 ml  Output   1650 ml  Net -267.32 ml    Last BM: PTA   Labs:   Recent Labs Lab 08/13/14 2139 08/14/14 0030 08/14/14 0356  NA 138 137 135  K 3.9 3.2* 3.1*  CL 117* 117* 118*  CO2 6* 8* 11*  BUN 17 17 17  CREATININE 1.32 1.17 0.94  CALCIUM 8.1* 8.0* 7.9*  GLUCOSE 301* 186* 134*    CBG (last 3)   Recent Labs  08/14/14 0800 08/14/14 0855 08/14/14 0959  GLUCAP 147* 134* 132*    Scheduled Meds: . enoxaparin (LOVENOX) injection  40 mg Subcutaneous Q24H  . sodium chloride  3 mL Intravenous Q12H    Continuous Infusions: . sodium chloride Stopped (08/13/14 2303)  . dextrose 5 % and 0.45% NaCl 125 mL/hr at 08/14/14 0656  . insulin (NOVOLIN-R) infusion 2.6 mL/hr at 08/14/14 0800    Past Medical History  Diagnosis Date  .  Diabetes mellitus without complication Dx 2012  . Hypertension Dx 2015  . Renal insufficiency 2011    pt was on dialysis for a short time approx 2011, pt is no longer on dialysis    History reviewed. No pertinent past surgical history.  Jaylenn Baiza A. [facility] Dietetic Intern 08/14/2014 10:14 AM

## 2014-08-14 NOTE — Progress Notes (Addendum)
CRITICAL VALUE ALERT  Critical value received:  CO2 8  Date of notification:  08/14/2014  Time of notification:  0311  Critical value read back:Yes.    Nurse who received alert:  Dianna Rossettiharles Otilio Groleau, RN  MD notified (1st page):  Merdis DelayK. Schorr, NP  Time of first page:  [phone]  MD notified (2nd page):  Time of second page:  Responding MD:  Merdis DelayK. Schorr, NP  Time MD responded:  [phone], no new orders received.

## 2014-08-15 LAB — BASIC METABOLIC PANEL
Anion gap: 6 (ref 5–15)
BUN: 6 mg/dL (ref 6–23)
CO2: 19 mmol/L (ref 19–32)
Calcium: 8.4 mg/dL (ref 8.4–10.5)
Chloride: 112 mmol/L (ref 96–112)
Creatinine, Ser: 0.78 mg/dL (ref 0.50–1.35)
GFR calc non Af Amer: >90 mL/min (ref >90)
GLUCOSE: 115 mg/dL — AB (ref 70–99)
Potassium: 3.3 mmol/L — ABNORMAL LOW (ref 3.5–5.1)
SODIUM: 137 mmol/L (ref 135–145)

## 2014-08-15 LAB — CBC
HCT: 35.8 % — ABNORMAL LOW (ref 39.0–52.0)
Hemoglobin: 13 g/dL (ref 13.0–17.0)
MCH: 30.4 pg (ref 26.0–34.0)
MCHC: 36.3 g/dL — ABNORMAL HIGH (ref 30.0–36.0)
MCV: 83.8 fL (ref 78.0–100.0)
PLATELETS: 239 10*3/uL (ref 150–400)
RBC: 4.27 MIL/uL (ref 4.22–5.81)
RDW: 14.4 % (ref 11.5–15.5)
WBC: 4.2 10*3/uL (ref 4.0–10.5)

## 2014-08-15 LAB — GLUCOSE, CAPILLARY
GLUCOSE-CAPILLARY: 175 mg/dL — AB (ref 70–99)
Glucose-Capillary: 117 mg/dL — ABNORMAL HIGH (ref 70–99)

## 2014-08-15 LAB — HEMOGLOBIN A1C
HEMOGLOBIN A1C: 12.4 % — AB (ref 4.8–5.6)
MEAN PLASMA GLUCOSE: 309 mg/dL

## 2014-08-15 MED ORDER — INSULIN ASPART 100 UNIT/ML ~~LOC~~ SOLN: 0.0000 [IU] | Freq: Three times a day (TID) | SUBCUTANEOUS | Status: DC

## 2014-08-15 MED ORDER — INSULIN ASPART PROT & ASPART (70-30 MIX) 100 UNIT/ML ~~LOC~~ SUSP: 30.0000 [IU] | Freq: Two times a day (BID) | SUBCUTANEOUS | Status: DC

## 2014-08-15 MED ORDER — POTASSIUM CHLORIDE CRYS ER 20 MEQ PO TBCR
60.0000 meq | EXTENDED_RELEASE_TABLET | Freq: Once | ORAL | Status: AC
  Administered 2014-08-15: 60 meq via ORAL
  Filled 2014-08-15: qty 3

## 2014-08-15 NOTE — Discharge Summary (Signed)
[facility]  Physician Discharge Summary   Patient ID: [name] [name] MRN: [id number] DOB/AGE: [birth year] 31 y.o.  Admit date: 08/13/2014 Discharge date: 08/15/2014  PCP: Lora PaulaFUNCHES, JOSALYN C, MD  DISCHARGE DIAGNOSES:  Principal Problem:   DKA (diabetic ketoacidoses) Active Problems:   DM (diabetes mellitus), type 1, uncontrolled   RECOMMENDATIONS FOR OUTPATIENT FOLLOW UP: 1. Patient asked to be compliant with his medication regimen.  2. He needs to follow up closely with his PCP.   DISCHARGE CONDITION: fair  Diet recommendation: Modified carbohydrate  Filed Weights   08/13/14 2322 08/14/14 0420 08/15/14 0354  Weight: 49.5 kg (109 lb 2 oz) 50.2 kg (110 lb 10.7 oz) 51.9 kg (114 lb 6.7 oz)    INITIAL HISTORY: 29 year old African-American male with a past medical history of type 1 diabetes mellitus with known noncompliance. He presented with hyperglycemia and was noted to have diabetic ketoacidosis. He was placed on an insulin infusion and admitted to stepdown unit.  Consultations:  None  Procedures:  None  HOSPITAL COURSE:   Severe Diabetic ketoacidosis in the setting of known type 1 diabetes Patient was started on intravenous insulin. He was given IV fluids aggressively. His anion gap slowly closed. His bicarbonate remained low for a while but then it is also normalized. There was no precipitant reason for his DKA apart from his noncompliance. Patient apparently had been running out of his insulin and had not refilled. Infectious etiology was ruled out. Chest x-ray was unremarkable. UA did not suggest infection. Lactic acid level was normal. HbA1c was 12.4.  Hypokalemia Patient's potassium dropped to 2.7. He was given potassium. Magnesium level was normal. With aggressive replacement. His potassium has improved to 3.3. He will be given more potassium today prior to discharge.   Symptomatically, patient has improved. He's been ambulated in the hallway. He is  stable for discharge. He has been asked to be compliant with his medication regimen. He has been given a Microbiologistmatch letter so that he is able to get his medications.  PERTINENT LABS:  The results of significant diagnostics from this hospitalization (including imaging, microbiology, ancillary and laboratory) are listed below for reference.    Microbiology: Recent Results (from the past 240 hour(s))  MRSA PCR Screening     Status: None   Collection Time: 08/13/14 11:30 PM  Result Value Ref Range Status   MRSA by PCR NEGATIVE NEGATIVE Final    Comment:        The GeneXpert MRSA Assay (FDA approved for NASAL specimens only), is one component of a comprehensive MRSA colonization surveillance program. It is not intended to diagnose MRSA infection nor to guide or monitor treatment for MRSA infections.      Labs: Basic Metabolic Panel:  Recent Labs Lab 08/14/14 0030 08/14/14 0356 08/14/14 1134 08/14/14 1747 08/15/14 0226  NA 137 135 134* 135 137  K 3.2* 3.1* 2.7* 3.0* 3.3*  CL 117* 118* 115* 113* 112  CO2 8* 11* 10* 16* 19  GLUCOSE 186* 134* 163* 116* 115*  BUN 17 17 13 10 6   CREATININE 1.17 0.94 0.79 0.74 0.78  CALCIUM 8.0* 7.9* 8.0* 8.2* 8.4  MG  --   --  1.7  --   --    Liver Function Tests:  Recent Labs Lab 08/13/14 1518  AST 21  ALT 12  ALKPHOS 90  BILITOT 1.4*  PROT 7.2  ALBUMIN 4.2   CBC:  Recent Labs Lab 08/13/14 1518 08/14/14 0356 08/15/14 0226  WBC 8.6 8.7 4.2  NEUTROABS 5.9  --   --   HGB 14.7 12.7* 13.0  HCT 43.0 36.1* 35.8*  MCV 89.0 84.9 83.8  PLT 278 167 239   CBG:  Recent Labs Lab 08/14/14 1835 08/14/14 1952 08/14/14 2101 08/14/14 2200 08/15/14 0808  GLUCAP 100* 117* 130* 123* 117*     IMAGING STUDIES Dg Chest 2 View  08/13/2014   CLINICAL DATA:  Shortness of breath.  EXAM: CHEST  2 VIEW  COMPARISON:  07/05/2014  FINDINGS: The heart size and mediastinal contours are within normal limits. Both lungs are clear. The visualized  skeletal structures are unremarkable.  IMPRESSION: Normal exam.   Electronically Signed   By: Francene Boyers M.D.   On: 08/13/2014 16:19    DISCHARGE EXAMINATION: Filed Vitals:   08/15/14 0354 08/15/14 0700 08/15/14 0745 08/15/14 1205  BP:   132/86 128/84  Pulse:   88 102  Temp:  97.6 F (36.4 C)  98.8 F (37.1 C)  TempSrc:  Oral  Oral  Resp:   17 17  Height:      Weight: 51.9 kg (114 lb 6.7 oz)     SpO2:   100% 99%   General appearance: alert, cooperative, appears stated age and no distress Resp: clear to auscultation bilaterally Cardio: regular rate and rhythm, S1, S2 normal, no murmur, click, rub or gallop GI: soft, non-tender; bowel sounds normal; no masses,  no organomegaly Extremities: extremities normal, atraumatic, no cyanosis or edema  DISPOSITION: Home  Discharge Instructions    Call MD for:  difficulty breathing, headache or visual disturbances    Complete by:  As directed      Call MD for:  extreme fatigue    Complete by:  As directed      Call MD for:  persistant dizziness or light-headedness    Complete by:  As directed      Call MD for:  persistant nausea and vomiting    Complete by:  As directed      Call MD for:  severe uncontrolled pain    Complete by:  As directed      Diet Carb Modified    Complete by:  As directed      Discharge instructions    Complete by:  As directed   Please make sure that you do not run out of Insulin in the future. Please eat 1-2 banana's daily for next few days. Check your blood sugars at home at least 3 times daily before meals. Maintain a records of the values.     Increase activity slowly    Complete by:  As directed            ALLERGIES: No Known Allergies   Current Discharge Medication List    CONTINUE these medications which have CHANGED   Details  insulin aspart (NOVOLOG) 100 UNIT/ML injection Inject 0-9 Units into the skin 3 (three) times daily with meals. Sliding scale Qty: 10 mL, Refills: 11    insulin  aspart protamine- aspart (NOVOLOG MIX 70/30) (70-30) 100 UNIT/ML injection Inject 0.3 mLs (30 Units total) into the skin 2 (two) times daily with a meal. Qty: 10 mL, Refills: 11      CONTINUE these medications which have NOT CHANGED   Details  atorvastatin (LIPITOR) 20 MG tablet Take 2 tablets (40 mg total) by mouth daily at 6 PM. Qty: 60 tablet, Refills: 0    HYDROcodone-acetaminophen (NORCO/VICODIN) 5-325 MG per tablet Take 1 tablet by mouth every 6 (six) hours  as needed for moderate pain. Qty: 15 tablet, Refills: 0    NEEDLE, DISP, 22 G (BD DISP NEEDLES) 22G X 1-1/2" MISC 5 Units by Does not apply route 4 (four) times daily -  with meals and at bedtime. Qty: 200 each, Refills: 0    Syringe, Disposable, 1 ML MISC 5 Units by Does not apply route 4 (four) times daily -  with meals and at bedtime. Qty: 200 each, Refills: 0    TRUEPLUS LANCETS 28G MISC 1 each by Does not apply route 3 (three) times daily. Qty: 100 each, Refills: 12      STOP taking these medications     clindamycin (CLEOCIN) 300 MG capsule        Follow-up Information    Follow up with [name], MD. Schedule an appointment as soon as possible for a visit in 4 days.   Specialty:  Family Medicine   Why:  post hospitalization follow up   Contact information:   [address] [place] [address] [phone]       TOTAL DISCHARGE TIME: 35 minutes  [facility]  [facility] Pager [phone]  08/15/2014, 2:46 PM

## 2014-08-15 NOTE — Discharge Instructions (Signed)
Diabetic Ketoacidosis °Diabetic ketoacidosis (DKA) is a life-threatening complication of type 1 diabetes. It must be quickly recognized and treated. Treatment requires hospitalization. °CAUSES  °When there is no insulin in the body, glucose (sugar) cannot be used, and the body breaks down fat for energy. When fat breaks down, acids (ketones) build up in the blood. Very high levels of glucose and high levels of acids lead to severe loss of body fluids (dehydration) and other dangerous chemical changes. This stresses your vital organs and can cause coma or death. °SIGNS AND SYMPTOMS  °· Tiredness (fatigue). °· Weight loss. °· Excessive thirst. °· Ketones in your urine. °· Light-headedness. °· Fruity or sweet smelling breath. °· Excessive urination. °· Visual changes. °· Confusion or irritability. °· Nausea or vomiting. °· Rapid breathing. °· Stomachache or abdominal pain. °DIAGNOSIS  °Your health care provider will diagnose DKA based on your history, physical exam, and blood tests. The health care provider will check to see if you have another illness that caused you to go into DKA. Most of this will be done quickly in an emergency room. °TREATMENT  °· Fluid replacement to correct dehydration. °· Insulin. °· Correction of electrolytes, such as potassium and sodium. °· Antibiotic medicines. °PREVENTION °· Always take your insulin. Do not skip your insulin injections. °· If you are sick, treat yourself quickly. Your body often needs more insulin to fight the illness. °· Check your blood glucose regularly. °· Check urine ketones if your blood glucose is greater than 240 milligrams per deciliter (mg/dL). °· Do not use outdated (expired) insulin. °· If your blood glucose is high, drink plenty of fluids. This helps flush out ketones. °HOME CARE INSTRUCTIONS  °· If you are sick, follow the advice of your health care provider. °· To prevent dehydration, drink enough water and fluids to keep your urine clear or pale  yellow. °¨ If you cannot eat, alternate between drinking fluids with sugar (soda, juices, flavored gelatin) and salty fluids (broth, bouillon). °¨ If you can eat, follow your usual diet and drink sugar-free liquids (water, diet drinks). °· Always take your usual dose of insulin. If you cannot eat or if your glucose is getting too low, call your health care provider for further instructions. °· Continue to monitor your blood or urine ketones every 3-4 hours around the clock. Set your alarm clock or have someone wake you up. If you are too sick, have someone test it for you. °· Rest and avoid exercise. °SEEK MEDICAL CARE IF:  °· You have a fever. °· You have ketones in your urine, or your blood glucose is higher than a level your health care provider suggests. You may need extra insulin. Call your health care provider if you need advice on adjusting your insulin. °· You cannot drink at least a tablespoon (15 mL) of fluid every 15-20 minutes. °· You have been vomiting for more than 2 hours. °· You have symptoms of DKA: °¨ Fruity smelling breath. °¨ Breathing faster or slower. °¨ Becoming very sleepy. °SEEK IMMEDIATE MEDICAL CARE IF:  °· You have signs of dehydration: °¨ Decreased urination. °¨ Increased thirst. °¨ Dry skin and mouth. °¨ Light-headedness. °· Your blood glucose is very high (as advised by your health care provider) twice in a row. °· You faint. °· You have chest pain or trouble breathing. °· You have a sudden, severe headache. °· You have sudden weakness in one arm or one leg. °· You have sudden trouble speaking or swallowing. °· You   have vomiting or diarrhea that is getting worse after 3 hours. °· You have abdominal pain. °MAKE SURE YOU:  °· Understand these instructions. °· Will watch your condition. °· Will get help right away if you are not doing well or get worse. °Document Released: 05/05/2000 Document Revised: 05/13/2013 Document Reviewed: 11/11/2008 °ExitCare® Patient Information ©2015 [employer]  [employer]. This information is not intended to replace advice given to you by your health care provider. Make sure you discuss any questions you have with your health care provider. ° °

## 2014-08-15 NOTE — Progress Notes (Signed)
Provided pt with discharge instructions. Pt verbalized understanding and signed copy. Pt given signed copy along with prescription and MATCH letter. Removed PIVs, dressings clean and intact, no signs of infection. Pt belongings packed to be sent home with pt. Pt aware of need to make follow up appt with Family Medicine. Blood sugar under control at this time. Will continue to monitor until discharge. CMT and Elink notified.

## 2014-08-16 NOTE — Care Management Note (Signed)
    Page 1 of 1   08/16/2014     2:39:08 PM CARE MANAGEMENT NOTE 08/16/2014  Patient:  [name],Tiran   Account Number:  [id number]  Date Initiated:  08/15/2014  Documentation initiated by:  [facility]FFRIES,Eragon Hammond  Subjective/Objective Assessment:   adm:     Action/Plan:   discharge planning   Anticipated DC Date:  08/15/2014   Anticipated DC Plan:        DC Planning Services  CM consult  MATCH Program  Medication Assistance      Choice offered to / List presented to:             Status of service:  Completed, signed off Medicare Important Message given?   (If response is "NO", the following Medicare IM given date fields will be blank) Date Medicare IM given:   Medicare IM given by:   Date Additional Medicare IM given:   Additional Medicare IM given by:    Discharge Disposition:  HOME/SELF CARE  Per UR Regulation:    If discussed at Long Length of Stay Meetings, dates discussed:    Comments:  08/15/14 CM gave pt MATCH letter to take to a pharmacy as our pharmacy closed.  [facility]  No other CM needs were communicated.  Freddy JakschSarah Brydan Downard, BSN, CM [phone].

## 2014-08-30 ENCOUNTER — Inpatient Hospital Stay (HOSPITAL_COMMUNITY)
Admission: EM | Admit: 2014-08-30 | Discharge: 2014-09-03 | DRG: 638 | Disposition: A | Discharge status: Home / Self Care | Payer: Self-pay | Attending: Internal Medicine | Admitting: Internal Medicine

## 2014-08-30 ENCOUNTER — Encounter (HOSPITAL_COMMUNITY): Payer: Self-pay | Admitting: *Deleted

## 2014-08-30 ENCOUNTER — Other Ambulatory Visit (HOSPITAL_COMMUNITY): Payer: Self-pay

## 2014-08-30 DIAGNOSIS — R319 Hematuria, unspecified: Secondary | ICD-10-CM | POA: Diagnosis present

## 2014-08-30 DIAGNOSIS — F4323 Adjustment disorder with mixed anxiety and depressed mood: Secondary | ICD-10-CM | POA: Diagnosis present

## 2014-08-30 DIAGNOSIS — E860 Dehydration: Secondary | ICD-10-CM | POA: Diagnosis present

## 2014-08-30 DIAGNOSIS — E1010 Type 1 diabetes mellitus with ketoacidosis without coma: Principal | ICD-10-CM

## 2014-08-30 DIAGNOSIS — Z87891 Personal history of nicotine dependence: Secondary | ICD-10-CM

## 2014-08-30 DIAGNOSIS — Z9114 Patient's other noncompliance with medication regimen: Secondary | ICD-10-CM | POA: Diagnosis present

## 2014-08-30 DIAGNOSIS — Z91148 Patient's other noncompliance with medication regimen for other reason: Secondary | ICD-10-CM

## 2014-08-30 DIAGNOSIS — F419 Anxiety disorder, unspecified: Secondary | ICD-10-CM | POA: Diagnosis present

## 2014-08-30 DIAGNOSIS — I10 Essential (primary) hypertension: Secondary | ICD-10-CM | POA: Diagnosis present

## 2014-08-30 DIAGNOSIS — E785 Hyperlipidemia, unspecified: Secondary | ICD-10-CM | POA: Diagnosis present

## 2014-08-30 DIAGNOSIS — N179 Acute kidney failure, unspecified: Secondary | ICD-10-CM

## 2014-08-30 DIAGNOSIS — E861 Hypovolemia: Secondary | ICD-10-CM | POA: Diagnosis present

## 2014-08-30 DIAGNOSIS — E876 Hypokalemia: Secondary | ICD-10-CM | POA: Diagnosis present

## 2014-08-30 LAB — HEPATIC FUNCTION PANEL
ALT: 16 U/L (ref 0–53)
AST: 25 U/L (ref 0–37)
Albumin: 3.9 g/dL (ref 3.5–5.2)
Alkaline Phosphatase: 86 U/L (ref 39–117)
BILIRUBIN INDIRECT: 1.3 mg/dL — AB (ref 0.3–0.9)
Bilirubin, Direct: 0.2 mg/dL (ref 0.0–0.5)
TOTAL PROTEIN: 7.5 g/dL (ref 6.0–8.3)
Total Bilirubin: 1.5 mg/dL — ABNORMAL HIGH (ref 0.3–1.2)

## 2014-08-30 LAB — BASIC METABOLIC PANEL
ANION GAP: 13 (ref 5–15)
ANION GAP: 7 (ref 5–15)
BUN: 14 mg/dL (ref 6–23)
BUN: 12 mg/dL (ref 6–23)
BUN: 10 mg/dL (ref 6–23)
CHLORIDE: 108 mmol/L (ref 96–112)
CHLORIDE: 117 mmol/L — AB (ref 96–112)
CHLORIDE: 122 mmol/L — AB (ref 96–112)
CO2: 7 mmol/L — AB (ref 19–32)
CO2: 11 mmol/L — AB (ref 19–32)
CREATININE: 1.22 mg/dL (ref 0.50–1.35)
CREATININE: 1.1 mg/dL (ref 0.50–1.35)
Calcium: 8.3 mg/dL — ABNORMAL LOW (ref 8.4–10.5)
Calcium: 7.6 mg/dL — ABNORMAL LOW (ref 8.4–10.5)
Calcium: 7.6 mg/dL — ABNORMAL LOW (ref 8.4–10.5)
Creatinine, Ser: 1.52 mg/dL — ABNORMAL HIGH (ref 0.50–1.35)
GFR calc Af Amer: >90 mL/min (ref >90)
GFR calc non Af Amer: 60 mL/min — ABNORMAL LOW (ref >90)
GFR calc non Af Amer: 78 mL/min — ABNORMAL LOW (ref >90)
GFR calc non Af Amer: 89 mL/min — ABNORMAL LOW (ref >90)
GFR, EST AFRICAN AMERICAN: 70 mL/min — AB (ref >90)
GLUCOSE: 105 mg/dL — AB (ref 70–99)
Glucose, Bld: 454 mg/dL — ABNORMAL HIGH (ref 70–99)
Glucose, Bld: 221 mg/dL — ABNORMAL HIGH (ref 70–99)
POTASSIUM: 3.7 mmol/L (ref 3.5–5.1)
POTASSIUM: 3.1 mmol/L — AB (ref 3.5–5.1)
Potassium: 4 mmol/L (ref 3.5–5.1)
SODIUM: 132 mmol/L — AB (ref 135–145)
SODIUM: 137 mmol/L (ref 135–145)
SODIUM: 140 mmol/L (ref 135–145)

## 2014-08-30 LAB — CBC WITH DIFFERENTIAL/PLATELET
Basophils Absolute: 0 10*3/uL (ref 0.0–0.1)
Basophils Relative: 0 % (ref 0–1)
EOS ABS: 0 10*3/uL (ref 0.0–0.7)
Eosinophils Relative: 0 % (ref 0–5)
HCT: 43.6 % (ref 39.0–52.0)
HEMOGLOBIN: 14.7 g/dL (ref 13.0–17.0)
Lymphocytes Relative: 5 % — ABNORMAL LOW (ref 12–46)
Lymphs Abs: 0.9 10*3/uL (ref 0.7–4.0)
MCH: 30.3 pg (ref 26.0–34.0)
MCHC: 33.7 g/dL (ref 30.0–36.0)
MCV: 89.9 fL (ref 78.0–100.0)
Monocytes Absolute: 0.5 10*3/uL (ref 0.1–1.0)
Monocytes Relative: 3 % (ref 3–12)
Neutro Abs: 15.9 10*3/uL — ABNORMAL HIGH (ref 1.7–7.7)
Neutrophils Relative %: 92 % — ABNORMAL HIGH (ref 43–77)
Platelets: 270 10*3/uL (ref 150–400)
RBC: 4.85 MIL/uL (ref 4.22–5.81)
RDW: 14.6 % (ref 11.5–15.5)
WBC: 17.3 10*3/uL — AB (ref 4.0–10.5)

## 2014-08-30 LAB — GLUCOSE, CAPILLARY
GLUCOSE-CAPILLARY: 240 mg/dL — AB (ref 70–99)
Glucose-Capillary: 164 mg/dL — ABNORMAL HIGH (ref 70–99)
Glucose-Capillary: 133 mg/dL — ABNORMAL HIGH (ref 70–99)

## 2014-08-30 LAB — I-STAT VENOUS BLOOD GAS, ED
Acid-base deficit: 28 mmol/L — ABNORMAL HIGH (ref 0.0–2.0)
Bicarbonate: 2.9 mEq/L — ABNORMAL LOW (ref 20.0–24.0)
O2 Saturation: 59 %
pCO2, Ven: 14.2 mmHg — ABNORMAL LOW (ref 45.0–50.0)
pH, Ven: 6.918 — CL (ref 7.250–7.300)
pO2, Ven: 49 mmHg — ABNORMAL HIGH (ref 30.0–45.0)

## 2014-08-30 LAB — URINALYSIS, ROUTINE W REFLEX MICROSCOPIC
Bilirubin Urine: NEGATIVE
Ketones, ur: >80 mg/dL — AB
LEUKOCYTES UA: NEGATIVE
Nitrite: NEGATIVE
Protein, ur: 100 mg/dL — AB
SPECIFIC GRAVITY, URINE: 1.022 (ref 1.005–1.030)
UROBILINOGEN UA: 0.2 mg/dL (ref 0.0–1.0)
pH: 5 (ref 5.0–8.0)

## 2014-08-30 LAB — CBG MONITORING, ED
GLUCOSE-CAPILLARY: 245 mg/dL — AB (ref 70–99)
GLUCOSE-CAPILLARY: 217 mg/dL — AB (ref 70–99)
Glucose-Capillary: 406 mg/dL — ABNORMAL HIGH (ref 70–99)
Glucose-Capillary: 330 mg/dL — ABNORMAL HIGH (ref 70–99)

## 2014-08-30 LAB — URINE MICROSCOPIC-ADD ON

## 2014-08-30 LAB — CK: Total CK: 96 U/L (ref 7–232)

## 2014-08-30 LAB — MAGNESIUM: Magnesium: 1.6 mg/dL (ref 1.5–2.5)

## 2014-08-30 LAB — PHOSPHORUS: PHOSPHORUS: 1.7 mg/dL — AB (ref 2.3–4.6)

## 2014-08-30 MED ORDER — DEXTROSE-NACL 5-0.45 % IV SOLN
INTRAVENOUS | Status: DC
  Administered 2014-08-30: 15:00:00 via INTRAVENOUS

## 2014-08-30 MED ORDER — SODIUM CHLORIDE 0.9 % IV BOLUS (SEPSIS)
1000.0000 mL | Freq: Once | INTRAVENOUS | Status: AC
  Administered 2014-08-30: 1000 mL via INTRAVENOUS

## 2014-08-30 MED ORDER — POTASSIUM CHLORIDE 10 MEQ/100ML IV SOLN
10.0000 meq | INTRAVENOUS | Status: AC
  Administered 2014-08-30 – 2014-08-31 (×4): 10 meq via INTRAVENOUS
  Filled 2014-08-30 (×4): qty 100

## 2014-08-30 MED ORDER — ACETAMINOPHEN 325 MG PO TABS: 650.0000 mg | ORAL_TABLET | Freq: Four times a day (QID) | ORAL | Status: DC | PRN

## 2014-08-30 MED ORDER — ACETAMINOPHEN 650 MG RE SUPP: 650.0000 mg | Freq: Four times a day (QID) | RECTAL | Status: DC | PRN

## 2014-08-30 MED ORDER — HEPARIN SODIUM (PORCINE) 5000 UNIT/ML IJ SOLN
5000.0000 [IU] | Freq: Three times a day (TID) | INTRAMUSCULAR | Status: DC
  Administered 2014-08-30 – 2014-08-31 (×2): 5000 [IU] via SUBCUTANEOUS
  Filled 2014-08-30 (×3): qty 1

## 2014-08-30 MED ORDER — ONDANSETRON HCL 4 MG/2ML IJ SOLN: 4.0000 mg | Freq: Four times a day (QID) | INTRAMUSCULAR | Status: DC | PRN

## 2014-08-30 MED ORDER — ONDANSETRON HCL 4 MG PO TABS: 4.0000 mg | ORAL_TABLET | Freq: Four times a day (QID) | ORAL | Status: DC | PRN

## 2014-08-30 MED ORDER — SODIUM CHLORIDE 0.9 % IV SOLN: INTRAVENOUS | Status: DC

## 2014-08-30 MED ORDER — SODIUM BICARBONATE 8.4 % IV SOLN
50.0000 meq | Freq: Once | INTRAVENOUS | Status: DC
  Filled 2014-08-30: qty 50

## 2014-08-30 MED ORDER — SODIUM CHLORIDE 0.9 % IV SOLN
INTRAVENOUS | Status: DC
  Administered 2014-08-30: 3.1 [IU]/h via INTRAVENOUS
  Administered 2014-08-30: 2.7 [IU]/h via INTRAVENOUS
  Filled 2014-08-30: qty 2.5

## 2014-08-30 MED ORDER — DEXTROSE-NACL 5-0.9 % IV SOLN
INTRAVENOUS | Status: DC
  Administered 2014-08-30: 17:00:00 via INTRAVENOUS

## 2014-08-30 MED ORDER — DEXTROSE 50 % IV SOLN: 25.0000 mL | INTRAVENOUS | Status: DC | PRN

## 2014-08-30 NOTE — Progress Notes (Signed)
CRITICAL VALUE ALERT  Critical value received:  CO2 =7    Date of notification:  08/30/14    Time of notification:  1634  Critical value read back:Yes.    Nurse who received alert:  Airica Schwartzkopf, RN  Responding MD: Dr. Katrinka BlazingSmith   Time MD responded:  [phone]

## 2014-08-30 NOTE — H&P (Signed)
PULMONARY / CRITICAL CARE MEDICINE HISTORY AND PHYSICAL EXAM   Name: [name] [name] MRN: [id number] DOB: 08-13-1985    ADMISSION DATE:  08/30/2014 CONSULTATION DATE:  08/30/14  REFERRING MD :  Manus Gunning, MD  CHIEF COMPLAINT:  Vomiting  INITIAL PRESENTATION: Non bilious non bloody emesis x2 last night, dizziness  STUDIES:  EKG 4/10 sinus tachycardia HR 128, probable anteroseptal MI with q waves V1-V2   SIGNIFICANT EVENTS: 4/10 Admitted to ICU with DKA   HISTORY OF PRESENT ILLNESS:   29 yr old man with PMH of DM1, with multiple hospitalizations for DKA in setting of insulin noncompliance, history of THC use, presenting with non bilious non bloody emesis x2 last night and notes that his blood sugars started going over 350 lat night. He normally uses Novolog 70/30 25 units twice per day and reports that he increased it to 30 units but this still did not bring his blood sugars down. He did not use his SSI at home. Denies penile discharge, increased urinary frequency, dysuria, abdominal pain, diarrhea, or sick contacts.   PAST MEDICAL HISTORY :   has a past medical history of Diabetes mellitus without complication (Dx 2012); Hypertension (Dx 2015); and Renal insufficiency (2011).  has no past surgical history on file. Prior to Admission medications   Medication Sig Start Date End Date Taking? Authorizing Provider  insulin aspart protamine- aspart (NOVOLOG MIX 70/30) (70-30) 100 UNIT/ML injection Inject 0.3 mLs (30 Units total) into the skin 2 (two) times daily with a meal. 08/15/14  Yes Osvaldo Shipper, MD  atorvastatin (LIPITOR) 20 MG tablet Take 2 tablets (40 mg total) by mouth daily at 6 PM. Patient not taking: Reported on 08/30/2014 07/02/14   Drema Dallas, MD  HYDROcodone-acetaminophen (NORCO/VICODIN) 5-325 MG per tablet Take 1 tablet by mouth every 6 (six) hours as needed for moderate pain. Patient not taking: Reported on 08/30/2014 07/20/14   Meredeth Ide, MD  insulin aspart (NOVOLOG) 100  UNIT/ML injection Inject 0-9 Units into the skin 3 (three) times daily with meals. Sliding scale Patient not taking: Reported on 08/30/2014 08/15/14   Osvaldo Shipper, MD  NEEDLE, DISP, 22 G (BD DISP NEEDLES) 22G X 1-1/2" MISC 5 Units by Does not apply route 4 (four) times daily -  with meals and at bedtime. 07/02/14   Drema Dallas, MD  Syringe, Disposable, 1 ML MISC 5 Units by Does not apply route 4 (four) times daily -  with meals and at bedtime. 07/02/14   Drema Dallas, MD  TRUEPLUS LANCETS 28G MISC 1 each by Does not apply route 3 (three) times daily. Patient not taking: Reported on 06/30/2014 05/04/14   Dessa Phi, MD   No Known Allergies  FAMILY HISTORY:  indicated that his mother is alive. He indicated that his father is deceased.  SOCIAL HISTORY:  reports that he quit smoking about a year ago. He has never used smokeless tobacco. He reports that he does not drink alcohol or use illicit drugs.  REVIEW OF SYSTEMS:   Review of Systems  Constitutional: Negative for fever, chills and weight loss.  HENT: Negative for congestion and sore throat.   Respiratory: Negative for cough, sputum production and shortness of breath.   Cardiovascular: Negative for chest pain.  Gastrointestinal: Positive for nausea and vomiting. Negative for abdominal pain, diarrhea and constipation.  Genitourinary: Negative for dysuria, urgency, frequency and flank pain.  Musculoskeletal: Negative for neck pain.  Skin: Negative for rash.  Neurological: Positive for dizziness. Negative for  focal weakness and weakness.    SUBJECTIVE:  Denies nausea at this time, no vomiting   VITAL SIGNS: Temp:  [97.7 F (36.5 C)] 97.7 F (36.5 C) (04/10 1045) Pulse Rate:  [109-132] 109 (04/10 1400) Resp:  [22-27] 22 (04/10 1400) BP: (117-141)/(75-92) 136/91 mmHg (04/10 1400) SpO2:  [100 %] 100 % (04/10 1400) HEMODYNAMICS:   VENTILATOR SETTINGS:   INTAKE / OUTPUT: No intake or output data in the 24 hours ending  08/30/14 1425  PHYSICAL EXAMINATION: General:  Pleasant, resting in bed, NAD Neuro:  Alert and Oriented x3 HEENT: MM, mild conjunctival edema bilaterally  Cardiovascular:  S1, S2, mild tachy, regular rate Lungs:  Clear to auscultation bilaterally Abdomen:  Soft, non tender, non distended, BS present  Musculoskeletal:  No focal weakness, moves all extremities voluntairly Skin:  Intact  LABS:  CBC  Recent Labs Lab 08/30/14 1116  WBC 17.3*  HGB 14.7  HCT 43.6  PLT 270   Coag's No results for input(s): APTT, INR in the last 168 hours. BMET  Recent Labs Lab 08/30/14 1116  NA 132*  K 4.0  CL 108  CO2 <5*  BUN 14  CREATININE 1.52*  GLUCOSE 454*   Electrolytes  Recent Labs Lab 08/30/14 1116  CALCIUM 8.3*   Sepsis Markers No results for input(s): LATICACIDVEN, PROCALCITON, O2SATVEN in the last 168 hours. ABG No results for input(s): PHART, PCO2ART, PO2ART in the last 168 hours. Liver Enzymes No results for input(s): AST, ALT, ALKPHOS, BILITOT, ALBUMIN in the last 168 hours. Cardiac Enzymes No results for input(s): TROPONINI, PROBNP in the last 168 hours. Glucose  Recent Labs Lab 08/30/14 1044 08/30/14 1256 08/30/14 1404  GLUCAP 406* 330* 245*    Imaging No results found.   ASSESSMENT / PLAN:  PULMONARY A:No active issues P:   O2 supplementation PRN  CARDIOVASCULAR A:  Mild tachycardia - in setting of hypovolemia from DKA P:  Continue IVF  RENAL A:  AKI - in setting of hypovolemia. BL Cr of 0.7, up to 1.5 on presentation      Hematuria - Moderate Hgb with only 0-2RBC, could be mild rhabdo due to dehydration P:    Continue IVF Check CK Check lactic acid  GASTROINTESTINAL A:  Nausea/Vomiting - Resolved.  P:   Zofran PRN for nausea NPO per DKA protocol Check LFTs  HEMATOLOGIC A:  Leucocytosis - No signs of infection, UA with no leucocytes, rare yeast. Denies UTI symptoms, no cough, no fever/chills. Could be 2/2 to hypovolemia v  stress demargination.  P:  CBC in am May consider Diflucan 150mg  PO once then in 72 hrs if concerned for yeast infection Check HIV  INFECTIOUS A:  Possible urinary yeast infection - UA with no leucocytes, rare yeast P:   -May consider Diflucan 150mg  PO once then in 72 hrs if concerned for yeast infection  ENDOCRINE A:  DKA - reports compliance with insulin but per chart, has had many hospitalizations due to noncompliance.   P:   Insulin drip per ICU protocol NPO CBG q1h BMET q4h D5NS continuous  Replete electrolytes as needed   NEUROLOGIC A:  No active issues P:   Continue monitoring   TODAY'S SUMMARY: 29 yr old man with DM1 and hx of insulin noncompliance with multiple hospitalizations presenting with vomiting x2, dizziness, found to be in DKA with AKI. Improving with IVF and insulin drip.     Ky BarbanKennerly, Solianny D, MD IMTS, PGY3  08/30/2014, 2:25 PM   Reviewed above, and examined.  29 yo male with N/V, abd pain and recurrent DKA.   He still feels nauseous, but abd pain decreased.  Denies headache, fever, sore throat, chest pain, fever, diarrhea, skin rash, or leg swelling.  Alert, follows commands, poor dentition, heart rate regular, lungs clear, abd with mild epigastric tenderness, no edema.  Will admit to ICU, continue IV insulin until gap closed, use D5 in IV fluid when blood sugar < 250, closely monitor and replace electrolytes as needed.  CC time by me independent of resident time is 35 minutes.  Coralyn Helling, MD [facility] Pulmonary/Critical Care 08/30/2014, 3:20 PM Pager:  [phone] After 3pm call: [phone]

## 2014-08-30 NOTE — ED Notes (Signed)
MD at bedside. 

## 2014-08-30 NOTE — ED Provider Notes (Signed)
CSN: [id number]     Arrival date & time 08/30/14  1040 History   First MD Initiated Contact with Patient 08/30/14 1103     Chief Complaint  Patient presents with  . Hyperglycemia     (Consider location/radiation/quality/duration/timing/severity/associated sxs/prior Treatment) HPI Comments: Patient with PMH of IDDM with multiple diabetes related admissions, DKA presents with complaint of blood sugars > 350. He decided to come in today for evaluation. Patient has noted some shortness of breath but no cough, fever, wheezing. He has nausea, vomiting starting this morning. Denies any fever, chills. No sick contacts. No dizziness, lightheadedness. No diarrhea. Denies any abdominal pain. Takes 70/30 insulin twice a day with food. Previous notes question compliance with regimen. The onset of this condition was acute. The course is constant. Aggravating factors: none. Alleviating factors: none.    The history is provided by the patient and medical records.    Past Medical History  Diagnosis Date  . Diabetes mellitus without complication Dx 2012  . Hypertension Dx 2015  . Renal insufficiency 2011    pt was on dialysis for a short time approx 2011, pt is no longer on dialysis   History reviewed. No pertinent past surgical history. Family History  Problem Relation Age of Onset  . Diabetes Father   . Diabetes Maternal Grandmother   . Heart disease Neg Hx   . Hypertension Neg Hx    History  Substance Use Topics  . Smoking status: Former Smoker    Quit date: 09/19/2013  . Smokeless tobacco: Never Used  . Alcohol Use: No    Review of Systems  Constitutional: Negative for fever.  HENT: Negative for rhinorrhea and sore throat.   Eyes: Negative for redness.  Respiratory: Positive for shortness of breath. Negative for cough.   Cardiovascular: Negative for chest pain.  Gastrointestinal: Positive for nausea and vomiting. Negative for abdominal pain and diarrhea.  Genitourinary: Negative for  dysuria.  Musculoskeletal: Negative for myalgias.  Skin: Negative for rash.  Neurological: Negative for headaches.    Allergies  Review of patient's allergies indicates no known allergies.  Home Medications   Prior to Admission medications   Medication Sig Start Date End Date Taking? Authorizing Provider  atorvastatin (LIPITOR) 20 MG tablet Take 2 tablets (40 mg total) by mouth daily at 6 PM. 07/02/14   Drema Dallas, MD  HYDROcodone-acetaminophen (NORCO/VICODIN) 5-325 MG per tablet Take 1 tablet by mouth every 6 (six) hours as needed for moderate pain. 07/20/14   Meredeth Ide, MD  insulin aspart (NOVOLOG) 100 UNIT/ML injection Inject 0-9 Units into the skin 3 (three) times daily with meals. Sliding scale 08/15/14   Osvaldo Shipper, MD  insulin aspart protamine- aspart (NOVOLOG MIX 70/30) (70-30) 100 UNIT/ML injection Inject 0.3 mLs (30 Units total) into the skin 2 (two) times daily with a meal. 08/15/14   Osvaldo Shipper, MD  NEEDLE, DISP, 22 G (BD DISP NEEDLES) 22G X 1-1/2" MISC 5 Units by Does not apply route 4 (four) times daily -  with meals and at bedtime. 07/02/14   Drema Dallas, MD  Syringe, Disposable, 1 ML MISC 5 Units by Does not apply route 4 (four) times daily -  with meals and at bedtime. 07/02/14   Drema Dallas, MD  TRUEPLUS LANCETS 28G MISC 1 each by Does not apply route 3 (three) times daily. Patient not taking: Reported on 06/30/2014 05/04/14   Josalyn Funches, MD   BP 128/84 mmHg  Pulse 132  Temp(Src) 97.7  F (36.5 C) (Oral)  Resp 24  SpO2 100%   Physical Exam  Constitutional: He appears well-developed and well-nourished.  HENT:  Head: Normocephalic and atraumatic.  Right Ear: External ear normal.  Left Ear: External ear normal.  Nose: Nose normal.  Mouth/Throat: Oropharynx is clear and moist. Mucous membranes are dry.  Eyes: Conjunctivae are normal. Pupils are equal, round, and reactive to light. Right eye exhibits no discharge. Left eye exhibits no discharge.   Neck: Normal range of motion. Neck supple.  Cardiovascular: Normal rate, regular rhythm and normal heart sounds.   Pulmonary/Chest: Effort normal and breath sounds normal. No accessory muscle usage. Tachypnea (mild (20-24)) noted. No respiratory distress. He has no wheezes. He has no rales.  Abdominal: Soft. Bowel sounds are decreased. There is no tenderness. There is no rebound and no guarding.  Neurological: He is alert.  Skin: Skin is warm and dry.  Psychiatric: He has a normal mood and affect.  Nursing note and vitals reviewed.   ED Course  Procedures (including critical care time) Labs Review Labs Reviewed  CBC WITH DIFFERENTIAL/PLATELET - Abnormal; Notable for the following:    WBC 17.3 (*)    Neutrophils Relative % 92 (*)    Neutro Abs 15.9 (*)    Lymphocytes Relative 5 (*)    All other components within normal limits  BASIC METABOLIC PANEL - Abnormal; Notable for the following:    Sodium 132 (*)    CO2 <5 (*)    Glucose, Bld 454 (*)    Creatinine, Ser 1.52 (*)    Calcium 8.3 (*)    GFR calc non Af Amer 60 (*)    GFR calc Af Amer 70 (*)    All other components within normal limits  URINALYSIS, ROUTINE W REFLEX MICROSCOPIC - Abnormal; Notable for the following:    Glucose, UA >1000 (*)    Hgb urine dipstick MODERATE (*)    Ketones, ur >80 (*)    Protein, ur 100 (*)    All other components within normal limits  URINE MICROSCOPIC-ADD ON - Abnormal; Notable for the following:    Casts GRANULAR CAST (*)    All other components within normal limits  CBG MONITORING, ED - Abnormal; Notable for the following:    Glucose-Capillary 406 (*)    All other components within normal limits  I-STAT VENOUS BLOOD GAS, ED - Abnormal; Notable for the following:    pH, Ven 6.918 (*)    pCO2, Ven 14.2 (*)    pO2, Ven 49.0 (*)    Bicarbonate 2.9 (*)    Acid-base deficit 28.0 (*)    All other components within normal limits  CBG MONITORING, ED - Abnormal; Notable for the following:     Glucose-Capillary 330 (*)    All other components within normal limits  CBG MONITORING, ED - Abnormal; Notable for the following:    Glucose-Capillary 245 (*)    All other components within normal limits    Imaging Review No results found.   EKG Interpretation   Date/Time:  :09 AM Patient seen and examined. Work-up initiated. Fluids ordered. Suspect DKA.   Vital signs reviewed  and are as follows: BP 128/84 mmHg  Pulse 132  Temp(Src) 97.7 F (36.5 C) (Oral)  Resp 24  SpO2 100%  11:36 AM VBG noted. Pt has received 1L NS so far, 2nd hung, 3rd ordered. IV insulin protocol ordered. Will speak with PCCM prior to admit.   1:52 PM Spoke with PCCM earlier. They will admit.   Dr. Garald BraverKennerly called back and requested amp of bicarb. I ordered this.   2:30 PM PCCM at bedside.   CRITICAL CARE Performed by: Carolee RotaGEIPLE,Mckell Riecke S Total critical care time: 40 Critical care time was exclusive of separately billable procedures and treating other patients. Critical care was necessary to treat or prevent imminent or life-threatening deterioration. Critical care was time spent personally by me on the following activities: development of treatment plan with patient and/or surrogate as well as nursing, discussions with consultants, evaluation of patient's response to treatment, examination of patient, obtaining history from patient or surrogate, ordering and performing treatments and interventions, ordering and review of laboratory studies, ordering and review of radiographic studies, pulse oximetry and re-evaluation of patient's condition.   MDM   Final diagnoses:  Diabetic ketoacidosis  without coma associated with type 1 diabetes mellitus   Admit to ICU.     Renne CriglerJoshua Eleasha Cataldo, PA-C 08/30/14 1432  Glynn OctaveStephen Rancour, MD 08/30/14 [phone]

## 2014-08-30 NOTE — ED Notes (Signed)
Pt states that his breathing feels better.

## 2014-08-30 NOTE — ED Notes (Signed)
Josh, PA notified of abnormal lab test resutls

## 2014-08-30 NOTE — ED Notes (Addendum)
Pt reports cbg HIGH for days, having sob x 2 days with n/v. cbg 406

## 2014-08-30 NOTE — ED Notes (Signed)
Attempted report 

## 2014-08-31 LAB — BASIC METABOLIC PANEL
ANION GAP: 7 (ref 5–15)
ANION GAP: 9 (ref 5–15)
Anion gap: 11 (ref 5–15)
BUN: 11 mg/dL (ref 6–23)
BUN: 12 mg/dL (ref 6–23)
BUN: 11 mg/dL (ref 6–23)
CHLORIDE: 116 mmol/L — AB (ref 96–112)
CO2: 12 mmol/L — ABNORMAL LOW (ref 19–32)
CO2: 11 mmol/L — AB (ref 19–32)
CO2: 12 mmol/L — AB (ref 19–32)
CREATININE: 1.11 mg/dL (ref 0.50–1.35)
Calcium: 7.8 mg/dL — ABNORMAL LOW (ref 8.4–10.5)
Calcium: 8 mg/dL — ABNORMAL LOW (ref 8.4–10.5)
Calcium: 8.2 mg/dL — ABNORMAL LOW (ref 8.4–10.5)
Chloride: 120 mmol/L — ABNORMAL HIGH (ref 96–112)
Chloride: 118 mmol/L — ABNORMAL HIGH (ref 96–112)
Creatinine, Ser: 1.1 mg/dL (ref 0.50–1.35)
Creatinine, Ser: 1.11 mg/dL (ref 0.50–1.35)
GFR calc Af Amer: >90 mL/min (ref >90)
GFR calc Af Amer: >90 mL/min (ref >90)
GFR calc non Af Amer: 89 mL/min — ABNORMAL LOW (ref >90)
GFR calc non Af Amer: 88 mL/min — ABNORMAL LOW (ref >90)
GFR, EST NON AFRICAN AMERICAN: 88 mL/min — AB (ref >90)
GLUCOSE: 134 mg/dL — AB (ref 70–99)
GLUCOSE: 206 mg/dL — AB (ref 70–99)
Glucose, Bld: 91 mg/dL (ref 70–99)
POTASSIUM: 3.5 mmol/L (ref 3.5–5.1)
Potassium: 3.5 mmol/L (ref 3.5–5.1)
Potassium: 2.9 mmol/L — ABNORMAL LOW (ref 3.5–5.1)
Sodium: 139 mmol/L (ref 135–145)
Sodium: 138 mmol/L (ref 135–145)
Sodium: 139 mmol/L (ref 135–145)

## 2014-08-31 LAB — GLUCOSE, CAPILLARY
GLUCOSE-CAPILLARY: 137 mg/dL — AB (ref 70–99)
GLUCOSE-CAPILLARY: 114 mg/dL — AB (ref 70–99)
GLUCOSE-CAPILLARY: 218 mg/dL — AB (ref 70–99)
GLUCOSE-CAPILLARY: 314 mg/dL — AB (ref 70–99)
Glucose-Capillary: 107 mg/dL — ABNORMAL HIGH (ref 70–99)
Glucose-Capillary: 120 mg/dL — ABNORMAL HIGH (ref 70–99)
Glucose-Capillary: 131 mg/dL — ABNORMAL HIGH (ref 70–99)
Glucose-Capillary: 154 mg/dL — ABNORMAL HIGH (ref 70–99)
Glucose-Capillary: 160 mg/dL — ABNORMAL HIGH (ref 70–99)
Glucose-Capillary: 168 mg/dL — ABNORMAL HIGH (ref 70–99)
Glucose-Capillary: 200 mg/dL — ABNORMAL HIGH (ref 70–99)
Glucose-Capillary: 221 mg/dL — ABNORMAL HIGH (ref 70–99)
Glucose-Capillary: 260 mg/dL — ABNORMAL HIGH (ref 70–99)
Glucose-Capillary: 261 mg/dL — ABNORMAL HIGH (ref 70–99)
Glucose-Capillary: 82 mg/dL (ref 70–99)
Glucose-Capillary: 87 mg/dL (ref 70–99)
Glucose-Capillary: 87 mg/dL (ref 70–99)
Glucose-Capillary: 91 mg/dL (ref 70–99)

## 2014-08-31 LAB — CBC
HEMATOCRIT: 38.2 % — AB (ref 39.0–52.0)
Hemoglobin: 13.9 g/dL (ref 13.0–17.0)
MCH: 31.1 pg (ref 26.0–34.0)
MCHC: 36.4 g/dL — ABNORMAL HIGH (ref 30.0–36.0)
MCV: 85.5 fL (ref 78.0–100.0)
Platelets: 150 10*3/uL (ref 150–400)
RBC: 4.47 MIL/uL (ref 4.22–5.81)
RDW: 14.7 % (ref 11.5–15.5)
WBC: 6.6 10*3/uL (ref 4.0–10.5)

## 2014-08-31 LAB — HIV ANTIBODY (ROUTINE TESTING W REFLEX): HIV Screen 4th Generation wRfx: NONREACTIVE

## 2014-08-31 MED ORDER — POTASSIUM CHLORIDE CRYS ER 20 MEQ PO TBCR
40.0000 meq | EXTENDED_RELEASE_TABLET | Freq: Once | ORAL | Status: AC
  Administered 2014-08-31: 40 meq via ORAL
  Filled 2014-08-31: qty 2

## 2014-08-31 MED ORDER — INSULIN ASPART 100 UNIT/ML ~~LOC~~ SOLN
0.0000 [IU] | Freq: Every day | SUBCUTANEOUS | Status: DC
  Administered 2014-08-31: 4 [IU] via SUBCUTANEOUS
  Administered 2014-09-01: 3 [IU] via SUBCUTANEOUS

## 2014-08-31 MED ORDER — POTASSIUM CHLORIDE 10 MEQ/100ML IV SOLN
10.0000 meq | INTRAVENOUS | Status: AC
  Administered 2014-08-31 (×3): 10 meq via INTRAVENOUS
  Filled 2014-08-31 (×3): qty 100

## 2014-08-31 MED ORDER — INSULIN GLARGINE 100 UNIT/ML ~~LOC~~ SOLN: 10.0000 [IU] | Freq: Every day | SUBCUTANEOUS | Status: DC

## 2014-08-31 MED ORDER — INSULIN GLARGINE 100 UNIT/ML ~~LOC~~ SOLN
10.0000 [IU] | Freq: Every day | SUBCUTANEOUS | Status: DC
  Administered 2014-08-31 – 2014-09-01 (×2): 10 [IU] via SUBCUTANEOUS
  Filled 2014-08-31 (×2): qty 0.1

## 2014-08-31 MED ORDER — GLUCERNA SHAKE PO LIQD
237.0000 mL | Freq: Three times a day (TID) | ORAL | Status: DC
  Administered 2014-08-31 (×2): 237 mL via ORAL

## 2014-08-31 MED ORDER — INSULIN ASPART 100 UNIT/ML ~~LOC~~ SOLN
3.0000 [IU] | Freq: Three times a day (TID) | SUBCUTANEOUS | Status: DC
  Administered 2014-08-31 – 2014-09-01 (×4): 3 [IU] via SUBCUTANEOUS

## 2014-08-31 MED ORDER — INSULIN ASPART 100 UNIT/ML ~~LOC~~ SOLN
0.0000 [IU] | Freq: Three times a day (TID) | SUBCUTANEOUS | Status: DC
Start: 2014-08-31 — End: 2014-09-03
  Administered 2014-08-31 (×2): 5 [IU] via SUBCUTANEOUS
  Administered 2014-08-31: 3 [IU] via SUBCUTANEOUS
  Administered 2014-09-01: 7 [IU] via SUBCUTANEOUS
  Administered 2014-09-01: 9 [IU] via SUBCUTANEOUS
  Administered 2014-09-01: 5 [IU] via SUBCUTANEOUS
  Administered 2014-09-02: 2 [IU] via SUBCUTANEOUS
  Administered 2014-09-02: 3 [IU] via SUBCUTANEOUS
  Administered 2014-09-02: 5 [IU] via SUBCUTANEOUS
  Administered 2014-09-03: 9 [IU] via SUBCUTANEOUS

## 2014-08-31 NOTE — Progress Notes (Signed)
PULMONARY / CRITICAL CARE MEDICINE Progress Note   Name: [name] [name] MRN: [id number] DOB: 1986-05-20    ADMISSION DATE:  08/30/2014 CONSULTATION DATE:  08/30/14  REFERRING MD :  Manus GunningANCOUR, MD  CHIEF COMPLAINT:  Vomiting  INITIAL PRESENTATION: Non bilious non bloody emesis x2 on 4/10, dizziness  STUDIES:  EKG 4/10 sinus tachycardia HR 128, probable anteroseptal MI with q waves V1-V2   SIGNIFICANT EVENTS: 4/10 Admitted to ICU with DKA   HISTORY OF PRESENT ILLNESS:   29 yr old man with PMH of DM1, with multiple hospitalizations for DKA in setting of insulin noncompliance, history of THC use, presenting with non bilious non bloody emesis x2 last night and notes that his blood sugars started going over 350 lat night. He normally uses Novolog 70/30 25 units twice per day and reports that he increased it to 30 units but this still did not bring his blood sugars down. He did not use his SSI at home. Denies penile discharge, increased urinary frequency, dysuria, abdominal pain, diarrhea, or sick contacts.    SUBJECTIVE:  Dizziness improved Hungry, wants to eat  VITAL SIGNS: Temp: [97.6 F (36.4 C)-99 F (37.2 C)] 98.8 F (37.1 C) (04/11 0759) Pulse Rate: [81-132] 87 (04/11 0600) Resp: [12-27] 12 (04/11 0600) BP: (111-146)/(60-97) 127/76 mmHg (04/11 0600) SpO2: [98 %-100 %] 99 % (04/11 0600) Weight: [106 lb 14.8 oz (48.5 kg)] 106 lb 14.8 oz (48.5 kg) (04/10 1630) HEMODYNAMICS:   INTAKE / OUTPUT:  Intake/Output Summary (Last 24 hours) at 08/31/14 0805 Last data filed at 08/31/14 0730  Gross per 24 hour  Intake 2059.64 ml  Output  2150 ml  Net -90.36 ml    PHYSICAL EXAMINATION: General: Pleasant, resting in bed, NAD Neuro: Alert and Oriented x3 HEENT: mild conjunctival edema bilaterally, MM  Cardiovascular: S1, S2, normal rate Lungs: Clear to auscultation bilaterally Abdomen: Soft, non tender, non distended, BS present  Musculoskeletal: No focal  weakness, moves all extremities voluntairly Skin: Intact  LABS:  CBC  Last Labs      Recent Labs Lab 08/30/14 1116 08/31/14 0413  WBC 17.3* 6.6  HGB 14.7 13.9  HCT 43.6 38.2*  PLT 270 150     Coag's  Last Labs     No results for input(s): APTT, INR in the last 168 hours.   BMET  Last Labs      Recent Labs Lab 08/30/14 1926 08/30/14 2308 08/31/14 0413  NA 140 139 138  K 3.1* 3.5 3.5  CL 122* 120* 118*  CO2 11* 12* 11*  BUN 10 11 12   CREATININE 1.10 1.10 1.11  GLUCOSE 105* 91 134*     Electrolytes  Last Labs      Recent Labs Lab 08/30/14 1545 08/30/14 1926 08/30/14 2308 08/31/14 0413  CALCIUM 7.6* 7.6* 7.8* 8.0*  MG 1.6 --  --  --   PHOS 1.7* --  --  --      Sepsis Markers  Last Labs     No results for input(s): LATICACIDVEN, PROCALCITON, O2SATVEN in the last 168 hours.   ABG  Last Labs     No results for input(s): PHART, PCO2ART, PO2ART in the last 168 hours.   Liver Enzymes  Last Labs      Recent Labs Lab 08/30/14 1545  AST 25  ALT 16  ALKPHOS 86  BILITOT 1.5*  ALBUMIN 3.9     Cardiac Enzymes  Last Labs     No results for input(s): TROPONINI, PROBNP in the  last 168 hours.   Glucose  Last Labs      Recent Labs Lab 08/31/14 0101 08/31/14 0156 08/31/14 0258 08/31/14 0401 08/31/14 0502 08/31/14 0604  GLUCAP 137* 131* 114* 120* 154* 160*      Imaging No results found.   ASSESSMENT / PLAN:  PULMONARY A:No active issues P:  O2 supplementation PRN  CARDIOVASCULAR A:  Mild tachycardia - in setting of hypovolemia from DKA-Resolved P:  Discontinue IVF Encourage oral hydration  RENAL A: AKI - in setting of hypovolemia. BL Cr of 0.7, up to 1.5 on presentation-Improved with IVF  Hematuria - Moderate Hgb with only 0-2RBC, could be mild rhabdo due to dehydration-CK low normal P:  Encourage oral  hdration  GASTROINTESTINAL A: Nausea/Vomiting - LFTs with mildly elevated bili. N/V Resolved P:  Zofran PRN for nausea Carb mod diet now that gap is closed  HEMATOLOGIC A: Leucocytosis - No signs of infection, UA with no leucocytes, rare yeast. Denies UTI symptoms, no cough, no fever/chills. Could be 2/2 to hypovolemia. Resolved  P:  CBC in am   INFECTIOUS A: Possible urinary yeast infection - UA with no leucocytes, rare yeast P:  -May consider Diflucan  PO once then in 72 hrs if concerned for yeast infection -F/u on HIV that is pending  ENDOCRINE A: DKA - reports compliance with insulin but per chart, has had many hospitalizations due to noncompliance.   Hypophosphotemia - Mild, may replete with oral phosphorus   Metabolic acidosis - Low bicarb improved with IVF and insulin drip P:  Dcd insulin drip, gap is closed Carb mod diet SSI  Diabetic educator consultation  NEUROLOGIC A: No active issues P:  Continue monitoring   TODAY'S SUMMARY: 29 yr old man with DM1 and hx of insulin noncompliance with multiple hospitalizations presenting with vomiting x2, dizziness, found to be in DKA with AKI. DKA resolved, AKI improved with IVF. Gap is now closed. Bicarb low but improving. Transfer to Med Surg today.   Ky Barban, MD IMTS, PGY3  08/31/2014, 8:05 AM     PCCM ATTENDING: I have reviewed pt's initial presentation, consultants notes and hospital database in detail.  The above assessment and plan was formulated under my direction.  In summary: DKA resolved Hyperchloremic acidosis after saline resuscitation  He is in no distress and exam is unremarkable It is unclear why he has had repeated episodes of DKA in past few months.He insists he is compliant with insulin and monitors FSBGs 4-5 times per day. Will have DM coordinator see Current insulin regimen reviewed Transfer to med-surg Advance diet and activity TRH to assume care  as of AM 4/12. Discussed with Dr Nichola Sizer, MD;  PCCM service; Mobile [phone]

## 2014-08-31 NOTE — Progress Notes (Signed)
Admission note:   Arrival Method: Transfer from 42M. Mental Status: A&OX4. Telemetry: N/A  Skin: Intact.  Tubes: N/A IV: RFA, RAC both NSL. Pain: Denies.  Family: No one at bedside. Living Situation: From home. Safety Measures: Call bell within reach. 6E Orientation: Oriented to unit and surroundings.  [facility] unit and lunch tray ordered.  Leanna BattlesEckelmann, Lynsi Dooner Eileen, RN.

## 2014-08-31 NOTE — H&P (Deleted)
Entered in Error.       Review of Systems

## 2014-08-31 NOTE — Progress Notes (Signed)
Inpatient Diabetes Program Recommendations  AACE/ADA: New Consensus Statement on Inpatient Glycemic Control (2013)  Target Ranges:  Prepandial:   less than 140 mg/dL      Peak postprandial:   less than 180 mg/dL (1-2 hours)      Critically ill patients:  140 - 180 mg/dL   Reason for Assessment:  Patient admitted with DKA.   Diabetes history: Type 1 diabetes Outpatient Diabetes medications: 70/30 25 units bid Current orders for Inpatient glycemic control:  Novolog sensitive tid with meals and HS, Novolog 3 units tid with meals, Lantus 10 units q HS  Patient well known to Inpatient diabetes team.  He has had 6 admits in the past 6 months with DKA.  Very polite and nice patient however not caring for self appropriately.  [facility].  Will request social work consult regarding potential needs or reasons for not following up. Called and discussed with resident.  Needs depression screen to identify potential barriers to self-care.  Will see patient this afternoon.  Thanks, Beryl MeagerJenny Tarren Sabree, RN, BC-ADM Inpatient Diabetes Coordinator Pager [phone] (8a-5p)

## 2014-08-31 NOTE — Progress Notes (Signed)
Talked with patient at length regarding continued admits with diabetes.  PHQ-9 screening done to assess for symptoms of depression.  Patient admitted to "Feeling down, depressed or hopeless"-More than 1/2 the days, "Feeling tired or having little energy"-More than 1/2 the days, "feeling bad about himself-or that he's let his family down"-Several days.  Patient scored a "5" on his PHQ9.  Discussed results with Child psychotherapistsocial worker and case Production designer, theatre/television/filmmanager. Spent time discussing diagnosis of Type 1 diabetes.  He again talked about his father who died of diabetes when the patient was [age].  Patient tearful.  Described need for insulin in type 1 diabetes and that insulin was necessary no matter what.  Discussed insulin profiles of 70/30 versus Lantus/Novolog.  Patient asked what he should do if his blood sugars are climbing.  Discussed drinking plenty of fluids and the potential for a "correction scale" if CBG's are too high.  May consider Correction insulin starting at 150 mg/dL for elevated CBG's.  Ideally with patient's age, he would likely do better with basa/bolus regimen, however patient would have to be willing to monitor and take 4 shots a day.  He really needs outpatient support/resources to help him get back on track. Discussed complications with patient.  Also tried to provide the patient with hope and the fact that diabetes can be controlled when monitored and treated adequately.  However patient must be willing to see the doctor, go to appointments, take medications, etc.  He admits that he doesn't like going to the doctor b/c he doesn't want to hear "bad news".  Will follow up on 09/01/14.    Reinforced teaching regarding diabetes with patient.  Thanks, Beryl MeagerJenny Kamari Buch, RN, BC-ADM Inpatient Diabetes Coordinator Pager [phone] (8a-5p)

## 2014-08-31 NOTE — Progress Notes (Signed)
INITIAL NUTRITION ASSESSMENT  Pt meets criteria for SEVERE MALNUTRITION in the context of chronic illness as evidenced by a 7% weight loss in 1 month and severe fat and muscle mass loss.  DOCUMENTATION CODES Per approved criteria  -Severe malnutrition in the context of chronic illness -Underweight   INTERVENTION: Provide Glucerna Shake po TID, each supplement provides 220 kcal and 10 grams of protein.  "MyPlate Guide Eating" nutrition handout given.  Encourage adequate PO intake.  NUTRITION DIAGNOSIS: Malnutrition related to chronic illness as evidenced by severe fat and muscle mass loss.   Goal: Pt to meet >/= 90% of their estimated nutrition needs   Monitor:  PO intake, weight trends, labs, I/O's  Reason for Assessment: MD consult  29 y.o. male  Admitting Dx: Diabetic ketoacidosis without coma associated with type 1 diabetes mellitus  ASSESSMENT: Pt with PMH of DM1, with multiple hospitalizations for DKA in setting of insulin noncompliance, history of THC use, presenting with non bilious non bloody emesis x2 last night and notes that his blood sugars started going over 350. Pt presents with AKI.  RD consulted as pt is requesting Glucerna Shake. Meal completion has been 95%. Pt is familiar with the RD team due to multiple previous admissions. Pt reports having a good appetite currently and PTA at home eating 3 meals a day along with snacks. Pt additonaly reports consuming Glucerna Shake TID. Noted per Epic weight records, pt with a 7% weight loss in 1 month. RD has ordered Glucerna Shake. Pt is knowledgeable on a diabetic diet and reports he is motivated to change. Pt additionally requests the "MyPlate Guide Eating". Handout of education given. Pt was encouraged to eat his food at meals and to continue supplementation.   Nutrition Focused Physical Exam:  Subcutaneous Fat:  Orbital Region: N/A Upper Arm Region: Mild depletion Thoracic and Lumbar Region: Severe  depletion  Muscle:  Temple Region: Moderate depletion Clavicle Bone Region: Severe depletion Clavicle and Acromion Bone Region: Severe depletion Scapular Bone Region: N/A Dorsal Hand: N/A Patellar Region: WNL Anterior Thigh Region: Moderate depletion Posterior Calf Region: Moderate depletion  Edema: none  Labs: Low potassium, CO2, calcium, and GFR. High chloride.  Height: Ht Readings from Last 1 Encounters:  08/30/14  (1.676 m)    Weight: Wt Readings from Last 1 Encounters:  08/30/14 106 lb 14.8 oz (48.5 kg)    Ideal Body Weight: 142 lbs  % Ideal Body Weight: 75%  Wt Readings from Last 10 Encounters:  08/30/14 106 lb 14.8 oz (48.5 kg)  08/15/14 114 lb 6.7 oz (51.9 kg)  07/17/14 102 lb 15.3 oz (46.7 kg)  06/30/14 110 lb 10.7 oz (50.2 kg)  05/01/14 114 lb (51.71 kg)  04/24/14 130 lb (58.968 kg)  07/07/13 110 lb 7.2 oz (50.1 kg)  03/21/12 126 lb 4.8 oz (57.289 kg)    Usual Body Weight: 115 lbs  % Usual Body Weight: 92%  BMI:  Body mass index is 17.27 kg/(m^2). Underweight  Estimated Nutritional Needs: Kcal: 1800-2000 Protein: 70-85 grams Fluid: 1.8- 2 L/day  Skin: Intact  Diet Order: Diet Carb Modified Fluid consistency:: Thin; Room service appropriate?: Yes  EDUCATION NEEDS: -Education needs addressed   Intake/Output Summary (Last 24 hours) at 08/31/14 1541 Last data filed at 08/31/14 1400  Gross per 24 hour  Intake 3091.04 ml  Output   2800 ml  Net 291.04 ml    Last BM: 4/9  Labs:   Recent Labs Lab 08/30/14 1545  08/30/14 2308 08/31/14 0413  08/31/14 0826  NA 137  < > 139 138 139  K 3.7  < > 3.5 3.5 2.9*  CL 117*  < > 120* 118* 116*  CO2 7*  < > 12* 11* 12*  BUN 12  < > 11 12 11   CREATININE 1.22  < > 1.10 1.11 1.11  CALCIUM 7.6*  < > 7.8* 8.0* 8.2*  MG 1.6  --   --   --   --   PHOS 1.7*  --   --   --   --   GLUCOSE 221*  < > 91 134* 206*  < > = values in this interval not displayed.  CBG (last 3)   Recent Labs   08/31/14 0604 08/31/14 1105 08/31/14 1145  GLUCAP 160* 221* 218*    Scheduled Meds: . insulin aspart  0-5 Units Subcutaneous QHS  . insulin aspart  0-9 Units Subcutaneous TID WC  . insulin aspart  3 Units Subcutaneous TID WC  . insulin glargine  10 Units Subcutaneous Daily    Continuous Infusions:   Past Medical History  Diagnosis Date  . Diabetes mellitus without complication Dx 2012  . Hypertension Dx 2015  . Renal insufficiency 2011    pt was on dialysis for a short time approx 2011, pt is no longer on dialysis    History reviewed. No pertinent past surgical history.  Marijean NiemannStephanie La, MS, RD, LDN Pager # [phone] After hours/ weekend pager # [phone]

## 2014-09-01 DIAGNOSIS — E1069 Type 1 diabetes mellitus with other specified complication: Secondary | ICD-10-CM

## 2014-09-01 LAB — GLUCOSE, CAPILLARY
GLUCOSE-CAPILLARY: 385 mg/dL — AB (ref 70–99)
GLUCOSE-CAPILLARY: 317 mg/dL — AB (ref 70–99)
Glucose-Capillary: 258 mg/dL — ABNORMAL HIGH (ref 70–99)
Glucose-Capillary: 271 mg/dL — ABNORMAL HIGH (ref 70–99)

## 2014-09-01 LAB — BASIC METABOLIC PANEL
ANION GAP: 10 (ref 5–15)
BUN: 11 mg/dL (ref 6–23)
CO2: 18 mmol/L — ABNORMAL LOW (ref 19–32)
Calcium: 8.6 mg/dL (ref 8.4–10.5)
Chloride: 108 mmol/L (ref 96–112)
Creatinine, Ser: 1 mg/dL (ref 0.50–1.35)
GFR calc non Af Amer: >90 mL/min (ref >90)
Glucose, Bld: 474 mg/dL — ABNORMAL HIGH (ref 70–99)
POTASSIUM: 3 mmol/L — AB (ref 3.5–5.1)
SODIUM: 136 mmol/L (ref 135–145)

## 2014-09-01 MED ORDER — GLUCERNA SHAKE PO LIQD
237.0000 mL | Freq: Every day | ORAL | Status: DC
  Administered 2014-09-01 – 2014-09-02 (×2): 237 mL via ORAL

## 2014-09-01 MED ORDER — SODIUM CHLORIDE 0.9 % IV SOLN
INTRAVENOUS | Status: DC
  Administered 2014-09-01 – 2014-09-02 (×3): via INTRAVENOUS

## 2014-09-01 MED ORDER — POTASSIUM CHLORIDE CRYS ER 20 MEQ PO TBCR
40.0000 meq | EXTENDED_RELEASE_TABLET | Freq: Two times a day (BID) | ORAL | Status: AC
  Administered 2014-09-01 (×2): 40 meq via ORAL
  Filled 2014-09-01 (×2): qty 2

## 2014-09-01 MED ORDER — PRO-STAT SUGAR FREE PO LIQD
30.0000 mL | Freq: Every day | ORAL | Status: DC
  Administered 2014-09-01 – 2014-09-03 (×3): 30 mL via ORAL
  Filled 2014-09-01 (×3): qty 30

## 2014-09-01 MED ORDER — INSULIN ASPART PROT & ASPART (70-30 MIX) 100 UNIT/ML ~~LOC~~ SUSP
22.0000 [IU] | Freq: Two times a day (BID) | SUBCUTANEOUS | Status: DC
  Administered 2014-09-01 – 2014-09-03 (×4): 22 [IU] via SUBCUTANEOUS
  Filled 2014-09-01 (×2): qty 10

## 2014-09-01 NOTE — Progress Notes (Signed)
[facility] PROGRESS NOTE  [name] [name] ZOX:[id number] DOB: 1985-07-04 DOA: 08/30/2014 PCP: [name], MD  Assessment/Plan: 29 y/o male with PMH of DM1, multiple DKAs/readmission presented with nausea, vomiting  -readmitted with DKA, uncontrolled DM, AKI   1. DKA, AG closed on IV insulin, IVF; but still uncontrolled glucose   2. DM, uncontrolled likely non compliance; glucose is still uncontrolled on lantus started in ICU;  -resume insulin 70/30 22 units BID+ISS; titrate as needed 3. AKI, resolved on IVF;  -hypokalemia-will replace; recheck in AM    Code Status: full Family Communication: d/w patient (indicate person spoken with, relationship, and if by phone, the number) Disposition Plan: home 24-48 hrs    Consultants:  none  Procedures:  none  Antibiotics:  none (indicate start date, and stop date if known)  HPI/Subjective: Alert, oriented   Objective: Filed Vitals:   09/01/14 0806  BP: 131/91  Pulse: 83  Temp: 98.1 F (36.7 C)  Resp: 20    Intake/Output Summary (Last 24 hours) at 09/01/14 1325 Last data filed at 09/01/14 0950  Gross per 24 hour  Intake   1162 ml  Output      0 ml  Net   1162 ml   Filed Weights   08/30/14 1630 08/31/14 2052  Weight: 48.5 kg (106 lb 14.8 oz) 49.079 kg (108 lb 3.2 oz)    Exam:   General:  alert  Cardiovascular: s1,s2 rrr  Respiratory: CTA BL  Abdomen: soft, nt,nd   Musculoskeletal: no Leg edema   Data Reviewed: Basic Metabolic Panel:  Recent Labs Lab 08/30/14 1545 08/30/14 1926 08/30/14 2308 08/31/14 0413 08/31/14 0826 09/01/14 0900  NA 137 140 139 138 139 136  K 3.7 3.1* 3.5 3.5 2.9* 3.0*  CL 117* 122* 120* 118* 116* 108  CO2 7* 11* 12* 11* 12* 18*  GLUCOSE 221* 105* 91 134* 206* 474*  BUN CREATININE 1.22 1.10 1.10 1.11 1.11 1.00  CALCIUM 7.6* 7.6* 7.8* 8.0* 8.2* 8.6  MG 1.6  --   --   --   --   --   PHOS 1.7*  --   --   --   --   --    Liver Function  Tests:  Recent Labs Lab 08/30/14 1545  AST 25  ALT 16  ALKPHOS 86  BILITOT 1.5*  PROT 7.5  ALBUMIN 3.9   No results for input(s): LIPASE, AMYLASE in the last 168 hours. No results for input(s): AMMONIA in the last 168 hours. CBC:  Recent Labs Lab 08/30/14 1116 08/31/14 0413  WBC 17.3* 6.6  NEUTROABS 15.9*  --   HGB 14.7 13.9  HCT 43.6 38.2*  MCV 89.9 85.5  PLT 270 150   Cardiac Enzymes:  Recent Labs Lab 08/30/14 1545  CKTOTAL 96   BNP (last 3 results) No results for input(s): BNP in the last 8760 hours.  ProBNP (last 3 results) No results for input(s): PROBNP in the last 8760 hours.  CBG:  Recent Labs Lab 08/31/14 1145 08/31/14 1641 08/31/14 2048 09/01/14 0806 09/01/14 1143  GLUCAP 218* 261* 314* 385* 317*    No results found for this or any previous visit (from the past 240 hour(s)).   Studies: No results found.  Scheduled Meds: . feeding supplement (GLUCERNA SHAKE)  237 mL Oral Q supper  . feeding supplement (PRO-STAT SUGAR FREE 64)  30 mL Oral Q lunch  . insulin aspart  0-5 Units Subcutaneous QHS  .  insulin aspart  0-9 Units Subcutaneous TID WC  . insulin aspart  3 Units Subcutaneous TID WC  . insulin glargine  10 Units Subcutaneous Daily   Continuous Infusions:   Principal Problem:   Diabetic ketoacidosis without coma associated with type 1 diabetes mellitus Active Problems:   Renal failure, acute   DKA, type 1    Time spent: >35 minutes     Esperanza SheetsBURIEV, Belina Mandile N  [facility] Pager [phone]. If 7PM-7AM, please contact night-coverage at [email or web address], password [email or web address] 09/01/2014, 1:25 PM  LOS: 2 days

## 2014-09-01 NOTE — Progress Notes (Signed)
NUTRITION FOLLOW UP  Pt meets criteria for SEVERE MALNUTRITION in the context of chronic illness as evidenced by a 7% weight loss in 1 month and severe fat and muscle mass loss.  DOCUMENTATION CODES Per approved criteria  -Severe malnutrition in the context of chronic illness -Underweight   INTERVENTION: Provide Glucerna Shake po once daily with meal, each supplement provides 220 kcal and 10 grams of protein.  Provide 30 ml Prostat po once daily with meal, each supplement provides 100 kcal and 15 grams of protein.  NUTRITION DIAGNOSIS: Malnutrition related to chronic illness as evidenced by severe fat and muscle mass loss; ongoing   Goal: Pt to meet >/= 90% of their estimated nutrition needs; met  Monitor:  PO intake, weight trends, labs, I/O's  29 y.o. male  Admitting Dx: Diabetic ketoacidosis without coma associated with type 1 diabetes mellitus  ASSESSMENT: Pt with PMH of DM1, with multiple hospitalizations for DKA in setting of insulin noncompliance, history of THC use, presenting with non bilious non bloody emesis x2 last night and notes that his blood sugars started going over 350. Pt presents with AKI.  CBG's have been elevated in the 200-400's ml/dL. Discussed with RN and pt, concerned Glucerna Shake is additionally elevating blood sugars. RD to decrease Glucerna and order 30 ml Sugar Free Prostat as pt is agreeable. Meal completion has been 100%. RD to continue to monitor.  Labs: Low potassium and CO2.  Height: Ht Readings from Last 1 Encounters:  08/30/14 $RemoveB'5\' 6"'jxgyjmrz$  (1.676 m)    Weight: Wt Readings from Last 1 Encounters:  08/31/14 108 lb 3.2 oz (49.079 kg)    BMI:  Body mass index is 17.47 kg/(m^2). Underweight  Re-Estimated Nutritional Needs: Kcal: 1800-2000 Protein: 70-85 grams Fluid: 1.8- 2 L/day  Skin: Intact  Diet Order: Diet Carb Modified Fluid consistency:: Thin; Room service appropriate?: Yes   Intake/Output Summary (Last 24 hours) at 09/01/14  1138 Last data filed at 09/01/14 0950  Gross per 24 hour  Intake   1162 ml  Output      0 ml  Net   1162 ml    Last BM: 4/11  Labs:   Recent Labs Lab 08/30/14 1545  08/31/14 0413 08/31/14 0826 09/01/14 0900  NA 137  < > 138 139 136  K 3.7  < > 3.5 2.9* 3.0*  CL 117*  < > 118* 116* 108  CO2 7*  < > 11* 12* 18*  BUN 12  < > $R'12 11 11  'GD$ CREATININE 1.22  < > 1.11 1.11 1.00  CALCIUM 7.6*  < > 8.0* 8.2* 8.6  MG 1.6  --   --   --   --   PHOS 1.7*  --   --   --   --   GLUCOSE 221*  < > 134* 206* 474*  < > = values in this interval not displayed.  CBG (last 3)   Recent Labs  08/31/14 1641 08/31/14 2048 09/01/14 0806  GLUCAP 261* 314* 385*    Scheduled Meds: . feeding supplement (GLUCERNA SHAKE)  237 mL Oral Q supper  . feeding supplement (PRO-STAT SUGAR FREE 64)  30 mL Oral Q lunch  . insulin aspart  0-5 Units Subcutaneous QHS  . insulin aspart  0-9 Units Subcutaneous TID WC  . insulin aspart  3 Units Subcutaneous TID WC  . insulin glargine  10 Units Subcutaneous Daily    Continuous Infusions:   Past Medical History  Diagnosis Date  . Diabetes  mellitus without complication Dx 1423  . Hypertension Dx 2015  . Renal insufficiency 2011    pt was on dialysis for a short time approx 2011, pt is no longer on dialysis    History reviewed. No pertinent past surgical history.  Kallie Locks, MS, RD, LDN Pager # [phone] After hours/ weekend pager # [phone]

## 2014-09-01 NOTE — Care Management Note (Addendum)
CARE MANAGEMENT NOTE 09/01/2014  Patient:  [name] [name],[name] [name]   Account Number:  [id number]  Date Initiated:  09/01/2014  Documentation initiated by:  Ladonte Verstraete  Subjective/Objective Assessment:   CM following for progression and d/c planning.     Action/Plan:   Diabetic Educator met with pt 08/31/14 to discuss D/C needs and [facility] Rep met with pt 09/01/2014 to review plan at time of d/c. Pt is currently in agreement to go directly to [facility] for meds.   Anticipated DC Date:  09/03/2014   Anticipated DC Plan:  HOME/SELF CARE         Choice offered to / List presented to:           HHRN with Alvis Lemmings for [facility] services, [facility] plan to meet pt at [facility] to obtain meds and supplies.  Status of service:  In process, will continue to follow Medicare Important Message given?  NO (If response is "NO", the following Medicare IM given date fields will be blank) Date Medicare IM given:   Medicare IM given by:   Date Additional Medicare IM given:   Additional Medicare IM given by:    Discharge Disposition:    Per UR Regulation:    If discussed at Long Length of Stay Meetings, dates discussed:    Comments:  09/01/2014 Pt has agreed to meeting with [facility] and has been informed of services available to him at [facility] and [facility]. He has agreed that he needs to go directly to [facility] after d/c to obtain medication and supplies. CRoyal RN MPH, case manager, [phone]

## 2014-09-01 NOTE — Progress Notes (Addendum)
Inpatient Diabetes Program Recommendations  AACE/ADA: New Consensus Statement on Inpatient Glycemic Control (2013)  Target Ranges:  Prepandial:   less than 140 mg/dL      Peak postprandial:   less than 180 mg/dL (1-2 hours)      Critically ill patients:  140 - 180 mg/dL   Reason for Assessment:  Results for [name] [name], [name] [name] (MRN [id number]) as of 09/01/2014 10:36  Ref. Range 08/31/2014 11:05 08/31/2014 11:45 08/31/2014 16:41 08/31/2014 20:48 09/01/2014 08:06  Glucose-Capillary Latest Range: 70-99 mg/dL 409221 (H) 811218 (H) 914261 (H) 314 (H) 385 (H)    Diabetes history: Type 1 Outpatient Diabetes medications: 70/30 25 units bid Current orders for Inpatient glycemic control:  Novolog sensitive tid with meals and HS, Lantus 10 units daily  Please consider restarting 70/30 22 units bid and stopping Lantus.  Also patient may benefit from correction scale for at home to correct elevated CBG's.  Would not start correction until CBG of 150 mg/dL and would recommend 1 unit for every 50 mg/dL greater than 782150 mg/dL.    MD please see note from 4/11 regarding patient's symptoms of depression.  Social work consulted.  Thanks, Beryl MeagerJenny Ardie Dragoo, RN, BC-ADM Inpatient Diabetes Coordinator Pager [phone]  (8a-5p)

## 2014-09-02 LAB — HEPATIC FUNCTION PANEL
ALT: 15 U/L (ref 0–53)
AST: 34 U/L (ref 0–37)
Albumin: 3.2 g/dL — ABNORMAL LOW (ref 3.5–5.2)
Alkaline Phosphatase: 62 U/L (ref 39–117)
BILIRUBIN TOTAL: 0.7 mg/dL (ref 0.3–1.2)
Bilirubin, Direct: 0.1 mg/dL (ref 0.0–0.5)
Indirect Bilirubin: 0.6 mg/dL (ref 0.3–0.9)
Total Protein: 6 g/dL (ref 6.0–8.3)

## 2014-09-02 LAB — BASIC METABOLIC PANEL
Anion gap: 6 (ref 5–15)
BUN: 12 mg/dL (ref 6–23)
CALCIUM: 8.4 mg/dL (ref 8.4–10.5)
CO2: 23 mmol/L (ref 19–32)
CREATININE: 0.74 mg/dL (ref 0.50–1.35)
Chloride: 112 mmol/L (ref 96–112)
GFR calc Af Amer: >90 mL/min (ref >90)
GFR calc non Af Amer: >90 mL/min (ref >90)
Glucose, Bld: 102 mg/dL — ABNORMAL HIGH (ref 70–99)
Potassium: 3.1 mmol/L — ABNORMAL LOW (ref 3.5–5.1)
Sodium: 141 mmol/L (ref 135–145)

## 2014-09-02 LAB — GLUCOSE, CAPILLARY
GLUCOSE-CAPILLARY: 155 mg/dL — AB (ref 70–99)
Glucose-Capillary: 225 mg/dL — ABNORMAL HIGH (ref 70–99)
Glucose-Capillary: 271 mg/dL — ABNORMAL HIGH (ref 70–99)
Glucose-Capillary: 245 mg/dL — ABNORMAL HIGH (ref 70–99)

## 2014-09-02 LAB — MAGNESIUM: MAGNESIUM: 1.8 mg/dL (ref 1.5–2.5)

## 2014-09-02 MED ORDER — POTASSIUM CHLORIDE CRYS ER 20 MEQ PO TBCR
40.0000 meq | EXTENDED_RELEASE_TABLET | Freq: Once | ORAL | Status: AC
  Administered 2014-09-02: 40 meq via ORAL
  Filled 2014-09-02: qty 2

## 2014-09-02 NOTE — Progress Notes (Signed)
Inpatient Diabetes Program Recommendations  AACE/ADA: New Consensus Statement on Inpatient Glycemic Control (2013)  Target Ranges:  Prepandial:   less than 140 mg/dL      Peak postprandial:   less than 180 mg/dL (1-2 hours)      Critically ill patients:  140 - 180 mg/dL   Reason for Visit:   Spoke with patient regarding discharge plans.  Please see note from 08/31/14 regarding patient having symptoms of depression.  Patient has been educated at length regarding diagnosis, however it seems that he is having a difficult time dealing with his diagnosis.  Needs resources regarding coping.  [facility] after [facility] to get his supplies.  He states that he also has a "transitional care" RN who will be following him after discharge.  Needs follow-up with PCP.  Thanks, Beryl MeagerJenny Conny Situ, RN, BC-ADM Inpatient Diabetes Coordinator Pager [phone] (8a-5p)

## 2014-09-02 NOTE — Clinical Social Work Note (Signed)
CSW received consult for resources for patient - type 1 diabetes. Patient seen by diabetes coordinator. CSW signing off as inappropriate referral.   Genelle BalVanessa Inessa Wardrop, MSW, LCSW Licensed Clinical Social Worker Clinical Social Work Department [facility] [phone]

## 2014-09-02 NOTE — Progress Notes (Signed)
[facility] PROGRESS NOTE  [name] [name] ZOX:[id number] DOB: [birth year] DOA: 08/30/2014 PCP: [name] [name]  Assessment/Plan: 29 y/o male with PMH of DM1, multiple DKAs/readmission presented with nausea, vomiting  -readmitted with DKA, uncontrolled DM, AKI   1. DKA, AG closed on IV insulin, IVF; transition to subQ insulin, restart diet  2. DM,  uncontrolled likely non compliance; reported taking 70/30 30units bid -resume insulin 70/30 22 units BID+ISS; titrate as needed  3. AKI, resolved on IVF;  -hypokalemia-will replace; recheck in AM    Code Status: full Family Communication: d/w patient  Disposition Plan: home 4/14    Consultants:  none  Procedures:  none  Antibiotics:  none (indicate start date, and stop date if known)  HPI/Subjective: Admit feeling depressed about coming to the hospital all the time for DKA, denies SI/HI, agree to see a psychiatrist for this. Currently, denies sob/cp/ab/n/v.  Objective: Filed Vitals:   09/02/14 0814  BP: 123/86  Pulse: 69  Temp: 97.8 F (36.6 C)  Resp: 17    Intake/Output Summary (Last 24 hours) at 09/02/14 1425 Last data filed at 09/02/14 0950  Gross per 24 hour  Intake 2086.25 ml  Output      0 ml  Net 2086.25 ml   Filed Weights   08/30/14 1630 08/31/14 2052 09/01/14 2034  Weight: 48.5 kg (106 lb 14.8 oz) 49.079 kg (108 lb 3.2 oz) 54.432 kg (120 lb)    Exam:   General:  alert  Cardiovascular: s1,s2 rrr  Respiratory: CTA BL  Abdomen: soft, nt,nd   Musculoskeletal: no Leg edema   Data Reviewed: Basic Metabolic Panel:  Recent Labs Lab 08/30/14 1545  08/30/14 2308 08/31/14 0413 08/31/14 0826 09/01/14 0900 09/02/14 0512 09/02/14 1006  NA 137  < > 139 138 139 136 141  --   K 3.7  < > 3.5 3.5 2.9* 3.0* 3.1*  --   CL 117*  < > 120* 118* 116* 108 112  --   CO2 7*  < > 12* 11* 12* 18* 23  --   GLUCOSE 221*  < > 91 134* 206* 474* 102*  --   BUN 12  < > 11 12 11 11 12   --    CREATININE 1.22  < > 1.10 1.11 1.11 1.00 0.74  --   CALCIUM 7.6*  < > 7.8* 8.0* 8.2* 8.6 8.4  --   MG 1.6  --   --   --   --   --   --  1.8  PHOS 1.7*  --   --   --   --   --   --   --   < > = values in this interval not displayed. Liver Function Tests:  Recent Labs Lab 08/30/14 1545 09/02/14 1006  AST 25 34  ALT 16 15  ALKPHOS 86 62  BILITOT 1.5* 0.7  PROT 7.5 6.0  ALBUMIN 3.9 3.2*   No results for input(s): LIPASE, AMYLASE in the last 168 hours. No results for input(s): AMMONIA in the last 168 hours. CBC:  Recent Labs Lab 08/30/14 1116 08/31/14 0413  WBC 17.3* 6.6  NEUTROABS 15.9*  --   HGB 14.7 13.9  HCT 43.6 38.2*  MCV 89.9 85.5  PLT 270 150   Cardiac Enzymes:  Recent Labs Lab 08/30/14 1545  CKTOTAL 96   BNP (last 3 results) No results for input(s): BNP in the last 8760 hours.  ProBNP (last 3 results) No results for input(s): PROBNP in  the last 8760 hours.  CBG:  Recent Labs Lab 09/01/14 1143 09/01/14 1652 09/01/14 2032 09/02/14 0755 09/02/14 1136  GLUCAP 317* 258* 271* 225* 271*    No results found for this or any previous visit (from the past 240 hour(s)).   Studies: No results found.  Scheduled Meds: . feeding supplement (GLUCERNA SHAKE)  237 mL Oral Q supper  . feeding supplement (PRO-STAT SUGAR FREE 64)  30 mL Oral Q lunch  . insulin aspart  0-5 Units Subcutaneous QHS  . insulin aspart  0-9 Units Subcutaneous TID WC  . insulin aspart protamine- aspart  22 Units Subcutaneous BID WC   Continuous Infusions: . sodium chloride Stopped (09/02/14 0640)    Principal Problem:   Diabetic ketoacidosis without coma associated with type 1 diabetes mellitus Active Problems:   Renal failure, acute   DKA, type 1    Time spent:     [name] [name]  [facility] Pager [phone]. If 7PM-7AM, please contact night-coverage at [email or web address], password [facility] 09/02/2014, 2:25 PM  LOS: 3 days

## 2014-09-03 DIAGNOSIS — F4323 Adjustment disorder with mixed anxiety and depressed mood: Secondary | ICD-10-CM

## 2014-09-03 DIAGNOSIS — Z9114 Patient's other noncompliance with medication regimen: Secondary | ICD-10-CM

## 2014-09-03 LAB — CBC
HCT: 32.6 % — ABNORMAL LOW (ref 39.0–52.0)
HEMOGLOBIN: 11.3 g/dL — AB (ref 13.0–17.0)
MCH: 29.8 pg (ref 26.0–34.0)
MCHC: 34.7 g/dL (ref 30.0–36.0)
MCV: 86 fL (ref 78.0–100.0)
Platelets: 164 10*3/uL (ref 150–400)
RBC: 3.79 MIL/uL — ABNORMAL LOW (ref 4.22–5.81)
RDW: 14.6 % (ref 11.5–15.5)
WBC: 2.9 10*3/uL — AB (ref 4.0–10.5)

## 2014-09-03 LAB — BASIC METABOLIC PANEL
Anion gap: 6 (ref 5–15)
BUN: 12 mg/dL (ref 6–23)
CALCIUM: 8.6 mg/dL (ref 8.4–10.5)
CO2: 27 mmol/L (ref 19–32)
Chloride: 106 mmol/L (ref 96–112)
Creatinine, Ser: 0.66 mg/dL (ref 0.50–1.35)
GFR calc Af Amer: >90 mL/min (ref >90)
GFR calc non Af Amer: >90 mL/min (ref >90)
Glucose, Bld: 362 mg/dL — ABNORMAL HIGH (ref 70–99)
POTASSIUM: 3.2 mmol/L — AB (ref 3.5–5.1)
SODIUM: 139 mmol/L (ref 135–145)

## 2014-09-03 LAB — GLUCOSE, CAPILLARY
GLUCOSE-CAPILLARY: 434 mg/dL — AB (ref 70–99)
Glucose-Capillary: 395 mg/dL — ABNORMAL HIGH (ref 70–99)

## 2014-09-03 MED ORDER — POTASSIUM CHLORIDE CRYS ER 20 MEQ PO TBCR
40.0000 meq | EXTENDED_RELEASE_TABLET | Freq: Once | ORAL | Status: AC
  Administered 2014-09-03: 40 meq via ORAL
  Filled 2014-09-03: qty 2

## 2014-09-03 MED ORDER — ATORVASTATIN CALCIUM 20 MG PO TABS: 20.0000 mg | ORAL_TABLET | Freq: Every day | ORAL | Status: DC

## 2014-09-03 MED ORDER — INSULIN ASPART 100 UNIT/ML ~~LOC~~ SOLN
0.0000 [IU] | Freq: Three times a day (TID) | SUBCUTANEOUS | Status: DC
  Administered 2014-09-03: 15 [IU] via SUBCUTANEOUS

## 2014-09-03 NOTE — Discharge Summary (Signed)
Discharge Summary  [name] [name] ZOX:[id number] DOB: 1985-11-03  PCP: [name], MD  Admit date: 08/30/2014 Discharge date: 09/03/2014  Time spent: <71mins  Recommendations for Outpatient Follow-up:  1. [facility] for diabetes control  Discharge Diagnoses:  Active Hospital Problems   Diagnosis Date Noted  . Diabetic ketoacidosis without coma associated with type 1 diabetes mellitus   . Adjustment disorder with mixed anxiety and depressed mood 09/03/2014  . DKA, type 1 08/30/2014  . Noncompliance with medication regimen   . Renal failure, acute 03/14/2012    Resolved Hospital Problems   Diagnosis Date Noted Date Resolved  No resolved problems to display.    Discharge Condition: stable  Diet recommendation: heart healthy/carb modified  Filed Weights   08/31/14 2052 09/01/14 2034 09/02/14 2045  Weight: 49.079 kg (108 lb 3.2 oz) 54.432 kg (120 lb) 55.293 kg (121 lb 14.4 oz)    History of present illness:  [name] [name] is a 29 y.o. male with a past medical history of type 1 diabetes mellitus who was hospitalized in February three times and one time in march 2016 for diabetic ketoacidosis. There are notes, which suggests that he is noncompliant with his diabetic regimen. Patient presented on 4/10 with n/v/sinus tachycardia/ARf.  Denies any fever, chills. No sick contacts. No dizziness, lightheadedness. No diarrhea. Denies any abdominal pain. He was initially admitted to ICU for severe DKA, with insulin drip and hydration, there was a concern of yeast in urine, he received diflucan x 1 dose on admission,  he is later transferred to hospitalist service for further management.  Hospital Course:  Principal Problem:   Diabetic ketoacidosis without coma associated with type 1 diabetes mellitus Active Problems:   Renal failure, acute   Noncompliance with medication regimen   DKA, type 1   Adjustment disorder with mixed anxiety and depressed  mood    1. DKA, AG closed on IV insulin, IVF; transition to subQ insulin, restart diet  2. DM,  uncontrolled likely non compliance; reported taking 70/30 30units bid -extensive education provided,  3. AKI, resolved on IVF; ? Yeast in the urine, s/p diflucan x1 on admission, no urinary symptom at time of discharge.  4.  hypokalemia-replaced  5. H/o HLD, restart low dose lipitor.  6.  Noncompliance, extensive education provided, patient initially states he feels depressed about being in the hospital multiple time this year, psychiatry consulted, determined that patient does not meet depression criteria. He is going to [facility] with [facility].  Procedures:  none  Consultations:   psychiatry  Discharge Exam: BP 137/94 mmHg  Pulse 67  Temp(Src) 97.6 F (36.4 C) (Oral)  Resp 18  Ht  (1.676 m)  Wt 55.293 kg (121 lb 14.4 oz)  BMI 19.68 kg/m2  SpO2 100%   General: alert  Cardiovascular: s1,s2 rrr  Respiratory: CTA BL  Abdomen: soft, nt,nd   Musculoskeletal: no Leg edema  Discharge Instructions You were cared for by a hospitalist during your hospital stay. If you have any questions about your discharge medications or the care you received while you were in the hospital after you are discharged, you can call the unit and asked to speak with the hospitalist on call if the hospitalist that took care of you is not available. Once you are discharged, your primary care physician will handle any further medical issues. Please note that NO REFILLS for any discharge medications will be authorized once you are discharged, as  it is imperative that you return to your primary care physician (or establish a relationship with a primary care physician if you do not have one) for your aftercare needs so that they can reassess your need for medications and monitor your lab values.  Discharge Instructions    Diet - low sodium heart healthy    Complete by:   As directed      Increase activity slowly    Complete by:  As directed             Medication List    STOP taking these medications        HYDROcodone-acetaminophen 5-325 MG per tablet  Commonly known as:  NORCO/VICODIN      TAKE these medications        atorvastatin 20 MG tablet  Commonly known as:  LIPITOR  Take 1 tablet (20 mg total) by mouth daily.     insulin aspart 100 UNIT/ML injection  Commonly known as:  novoLOG  Inject 0-9 Units into the skin 3 (three) times daily with meals. Sliding scale     insulin aspart protamine- aspart (70-30) 100 UNIT/ML injection  Commonly known as:  NOVOLOG MIX 70/30  Inject 0.3 mLs (30 Units total) into the skin 2 (two) times daily with a meal.     NEEDLE (DISP) 22 G 22G X 1-1/2" Misc  Commonly known as:  BD DISP NEEDLES  5 Units by Does not apply route 4 (four) times daily -  with meals and at bedtime.     Syringe (Disposable) 1 ML Misc  5 Units by Does not apply route 4 (four) times daily -  with meals and at bedtime.     TRUEPLUS LANCETS 28G Misc  1 each by Does not apply route 3 (three) times daily.       No Known Allergies     Follow-up Information    Follow up with [facility]     In 1 week.   Contact information:   [address] [place] [address] [phone]       The results of significant diagnostics from this hospitalization (including imaging, microbiology, ancillary and laboratory) are listed below for reference.    Significant Diagnostic Studies: Dg Chest 2 View  08/13/2014   CLINICAL DATA:  Shortness of breath.  EXAM: CHEST  2 VIEW  COMPARISON:  07/05/2014  FINDINGS: The heart size and mediastinal contours are within normal limits. Both lungs are clear. The visualized skeletal structures are unremarkable.  IMPRESSION: Normal exam.   Electronically Signed   By: Francene Boyers M.D.   On: 08/13/2014 16:19    Microbiology: No results found for this or  any previous visit (from the past 240 hour(s)).   Labs: Basic Metabolic Panel:  Recent Labs Lab 08/30/14 1545  08/31/14 0413 08/31/14 0826 09/01/14 0900 09/02/14 0512 09/02/14 1006 09/03/14 0630  NA 137  < > 138 139 136 141  --  139  K 3.7  < > 3.5 2.9* 3.0* 3.1*  --  3.2*  CL 117*  < > 118* 116* 108 112  --  106  CO2 7*  < > 11* 12* 18* 23  --  27  GLUCOSE 221*  < > 134* 206* 474* 102*  --  362*  BUN 12  < > --  12  CREATININE 1.22  < > 1.11 1.11 1.00 0.74  --  0.66  CALCIUM 7.6*  < >  8.0* 8.2* 8.6 8.4  --  8.6  MG 1.6  --   --   --   --   --  1.8  --   PHOS 1.7*  --   --   --   --   --   --   --   < > = values in this interval not displayed. Liver Function Tests:  Recent Labs Lab 08/30/14 1545 09/02/14 1006  AST 25 34  ALT 16 15  ALKPHOS 86 62  BILITOT 1.5* 0.7  PROT 7.5 6.0  ALBUMIN 3.9 3.2*   No results for input(s): LIPASE, AMYLASE in the last 168 hours. No results for input(s): AMMONIA in the last 168 hours. CBC:  Recent Labs Lab 08/30/14 1116 08/31/14 0413 09/03/14 0630  WBC 17.3* 6.6 2.9*  NEUTROABS 15.9*  --   --   HGB 14.7 13.9 11.3*  HCT 43.6 38.2* 32.6*  MCV 89.9 85.5 86.0  PLT 270 150 164   Cardiac Enzymes:  Recent Labs Lab 08/30/14 1545  CKTOTAL 96   BNP: BNP (last 3 results) No results for input(s): BNP in the last 8760 hours.  ProBNP (last 3 results) No results for input(s): PROBNP in the last 8760 hours.  CBG:  Recent Labs Lab 09/02/14 1136 09/02/14 1700 09/02/14 2050 09/03/14 0758 09/03/14 1129  GLUCAP 271* 245* 155* 434* 395*       Signed:  Donta Mcinroy MD, PhD  [facility] 09/03/2014, 1:22 PM

## 2014-09-03 NOTE — Progress Notes (Signed)
[name] MastAnthony [name] to be D/C'd Home per MD order.  Discussed prescriptions and follow up appointments with the patient. Prescriptions given to patient, medication list explained in detail. Pt verbalized understanding.    Medication List    STOP taking these medications        HYDROcodone-acetaminophen 5-325 MG per tablet  Commonly known as:  NORCO/VICODIN      TAKE these medications        atorvastatin 20 MG tablet  Commonly known as:  LIPITOR  Take 1 tablet (20 mg total) by mouth daily.     insulin aspart 100 UNIT/ML injection  Commonly known as:  novoLOG  Inject 0-9 Units into the skin 3 (three) times daily with meals. Sliding scale     insulin aspart protamine- aspart (70-30) 100 UNIT/ML injection  Commonly known as:  NOVOLOG MIX 70/30  Inject 0.3 mLs (30 Units total) into the skin 2 (two) times daily with a meal.     NEEDLE (DISP) 22 G 22G X 1-1/2" Misc  Commonly known as:  BD DISP NEEDLES  5 Units by Does not apply route 4 (four) times daily -  with meals and at bedtime.     Syringe (Disposable) 1 ML Misc  5 Units by Does not apply route 4 (four) times daily -  with meals and at bedtime.     TRUEPLUS LANCETS 28G Misc  1 each by Does not apply route 3 (three) times daily.        Filed Vitals:   09/03/14 0931  BP: 137/94  Pulse: 67  Temp: 97.6 F (36.4 C)  Resp: 18    Skin clean, dry and intact without evidence of skin break down, no evidence of skin tears noted. IV catheter discontinued intact. Site without signs and symptoms of complications. Dressing and pressure applied. Pt denies pain at this time. No complaints noted.  An After Visit Summary was printed and given to the patient. Patient escorted via WC, and D/C home via private auto.  [name] A 09/03/2014 2:26 PM

## 2014-09-03 NOTE — Progress Notes (Signed)
Inpatient Diabetes Program Recommendations  AACE/ADA: New Consensus Statement on Inpatient Glycemic Control (2013)  Target Ranges:  Prepandial:   less than 140 mg/dL      Peak postprandial:   less than 180 mg/dL (1-2 hours)      Critically ill patients:  140 - 180 mg/dL   Reason for Assessment:  Results for [name] [name], [name] [name] (MRN [id number]) as of 09/03/2014 09:18  Ref. Range 09/02/2014 07:55 09/02/2014 11:36 09/02/2014 17:00 09/02/2014 20:50 09/03/2014 07:58  Glucose-Capillary Latest Ref Range: 70-99 mg/dL 829225 (H) 562271 (H) 130245 (H) 155 (H) 434 (H)   Diabetes history: Type 1 diabetes Outpatient Diabetes medications: 70/30 30 units bid, Novolog correction-however patient not taking Current orders for Inpatient glycemic control:  Novolog 70/30 mix 22 units bid, Novolog sensitive tid with meals  CBG up greater than 400 mg/dL this morning.  Consider increasing 70/30 to 28 units bid.  Also patient would benefit from sliding scale/correction at discharge so that he can correct elevations.   Patient does not have insurance to [facility] the [facility] is key in making sure he is getting his supplies and getting the resources he needs.  Note that patient has agree to see a psychiatrist for depression.  Unsure if [facility]?  Due to continuous readmits, he needs counseling and follow-up for his feelings related to his diagnosis.  His lack of supplies and resources is an added area of stress for him however he is adamant that he wants to feel better and take care of himself.  He states that he had to quit work because he was feeling so poorly.   Thanks, Beryl MeagerJenny Brennley Curtice, RN, BC-ADM Inpatient Diabetes Coordinator Pager [phone] (8a-5p)

## 2014-09-03 NOTE — Consult Note (Signed)
[place] [facility]iat[facility] Consult   Reason for Consult:  Depression and non-complaince diabetic medication treatment Referring Physician:  Dr. Erlinda Hong Patient Identification: [name] [name] MRN:  [id number] Principal Diagnosis: Diabetic ketoacidosis without coma associated with type 1 diabetes mellitus Diagnosis:   Patient Active Problem List   Diagnosis Date Noted  . Adjustment disorder with mixed anxiety and depressed mood [F43.23] 09/03/2014  . DKA, type 1 [E10.10] 08/30/2014  . Facial cellulitis [F81.017]   . Facial edema [R60.0] 07/18/2014  . Type 1 diabetes mellitus with ketoacidosis and without coma [E10.10]   . Protein-calorie malnutrition, severe [E43] 07/06/2014  . Diabetic ketoacidosis [E13.10] 07/05/2014  . Essential hypertension [I10]   . Acidosis due to secondary diabetes [E13.10] 06/30/2014  . DKA (diabetic ketoacidoses) [E13.10] 06/30/2014  . DM (diabetes mellitus), type 1, uncontrolled [E10.65] 05/01/2014  . HLD (hyperlipidemia) [E78.5] 04/26/2014  . Diabetic ketoacidosis without coma associated with type 1 diabetes mellitus [E10.10]   . Tachycardia [R00.0]   . Noncompliance with medication regimen [Z91.14]   . Substance abuse [F19.10]   . Hypokalemia [E87.6]   . Elevated blood pressure [R03.0] 04/24/2014  . Dehydration [E86.0] 07/07/2013  . Renal failure, acute [N17.9] 03/14/2012  . Metabolic encephalopathy [P10.25] 03/10/2012  . High blood pressure associated with diabetes [E11.69, I10] 03/10/2012  . Fatigue [R53.83] 03/10/2012    Total Time spent with patient: 45 minutes  Subjective:   [name] [name] is a 29 y.o. male patient admitted with depression and anxiety.  HPI:  [name] [name] is a 29 y.o. male seen, chart reviewed and case discussed with the Dr. Erlinda Hong for psychiatric consultation and evaluation of possible depression and anxiety with moderate to severe psychosocial stressors. 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 Patient reported he has been receiving insulin for his type 1 diabetes mellitus from [facility] who provides discounted Coupons.  Patient reported that he continued to be noncompliant with his diabetes treatment secondary to lack of adequate resources and has multiple acute acute hospitalization for diabetic ketoacidosis. Patient minimizes his current symptoms of depression anxiety saying that he cannot afford taking additional medication and also stated that is willing to receive counseling services as long as it does not cost money to him. Patient denied disturbance of sleep and appetite. Patient has normal energy and motivation. Patient denies current suicidal/homes and ideation, intention or plans. Patient has no evidence of psychosis. Patient denied history of alcohol and drug abuse. Patient has no previous history of acute psychiatric hospitalization or outpatient medication management.  Medical history: Patient with a past medical history of type 1 diabetes mellitus who was hospitalized in February for diabetic ketoacidosis. There are notes, which suggests that he is noncompliant with his diabetic regimen. Patient presented today with complaints of high blood sugar, which has been ongoing for about a week and a half. Patient tells me that he's been running out of his insulin 70/30, so has been taking less insulin than he should be. And then his blood sugars have been running in the 300s to 400s. He decided to come in today for evaluation. Patient has noted some shortness of breath but no cough, fever, wheezing. Nausea, vomiting. Denies any fever, chills. No sick contacts. No dizziness, lightheadedness. No diarrhea. Denies any abdominal pain.  HPI Elements:   Location:  Depression and anxiety. Quality:  Fair to poor. Severity:  Unable to care for his medical needs. Timing:  Inadequate resources.  Duration:  Chronic. Context:  Severe psychosocial  stressors.  Past Medical History:  Past Medical History  Diagnosis Date  . Diabetes mellitus without complication Dx 7371  . Hypertension Dx 2015  . Renal insufficiency 2011    pt was on dialysis for a short time approx 2011, pt is no longer on dialysis   History reviewed. No pertinent past surgical history. Family History:  Family History  Problem Relation Age of Onset  . Diabetes Father   . Diabetes Maternal Grandmother   . Heart disease Neg Hx   . Hypertension Neg Hx    Social History:  History  Alcohol Use No     History  Drug Use No    History   Social History  . Marital Status: Single    Spouse Name: N/A  . Number of Children: 1   . Years of Education: 12    Occupational History  . Unemployed     Social History Main Topics  . Smoking status: Former Smoker    Quit date: 09/19/2013  . Smokeless tobacco: Never Used  . Alcohol Use: No  . Drug Use: No  . Sexual Activity: Yes    Birth Control/ Protection: Condom   Other Topics Concern  . None   Social History Narrative   Lives with mother and older brother.   Has 1 daughter, age 33.          Additional Social History:                          Allergies:  No Known Allergies  Labs:  Results for orders placed or performed during the hospital encounter of 08/30/14 (from the past 48 hour(s))  Glucose, capillary     Status: Abnormal   Collection Time: 09/01/14 11:43 AM  Result Value Ref Range   Glucose-Capillary 317 (H) 70 - 99 mg/dL  Glucose, capillary     Status: Abnormal   Collection Time: 09/01/14  4:52 PM  Result Value Ref Range   Glucose-Capillary 258 (H) 70 - 99 mg/dL  Glucose, capillary     Status: Abnormal   Collection Time: 09/01/14  8:32 PM  Result Value Ref Range   Glucose-Capillary 271 (H) 70 - 99 mg/dL  Basic metabolic panel     Status: Abnormal   Collection Time: 09/02/14  5:12 AM  Result Value Ref Range   Sodium 141 135 - 145 mmol/L   Potassium 3.1 (L) 3.5 - 5.1 mmol/L    Chloride 112 96 - 112 mmol/L   CO2 23 19 - 32 mmol/L   Glucose, Bld 102 (H) 70 - 99 mg/dL   BUN 12 6 - 23 mg/dL   Creatinine, Ser 0.74 0.50 - 1.35 mg/dL   Calcium 8.4 8.4 - 10.5 mg/dL   GFR calc non Af Amer >90 >90 mL/min   GFR calc Af Amer >90 >90 mL/min    Comment: (NOTE) The eGFR has been calculated using the CKD EPI equation. This calculation has not been validated in all clinical situations. eGFR's persistently <90 mL/min signify possible Chronic Kidney Disease.    Anion gap 6 5 - 15  Glucose, capillary     Status: Abnormal   Collection Time: 09/02/14  7:55 AM  Result Value Ref Range   Glucose-Capillary 225 (H) 70 - 99 mg/dL  Magnesium     Status: None   Collection Time: 09/02/14 10:06 AM  Result Value Ref Range   Magnesium 1.8 1.5 -  2.5 mg/dL  Hepatic function panel     Status: Abnormal   Collection Time: 09/02/14 10:06 AM  Result Value Ref Range   Total Protein 6.0 6.0 - 8.3 g/dL   Albumin 3.2 (L) 3.5 - 5.2 g/dL   AST 34 0 - 37 U/L   ALT 15 0 - 53 U/L   Alkaline Phosphatase 62 39 - 117 U/L   Total Bilirubin 0.7 0.3 - 1.2 mg/dL   Bilirubin, Direct 0.1 0.0 - 0.5 mg/dL   Indirect Bilirubin 0.6 0.3 - 0.9 mg/dL  Glucose, capillary     Status: Abnormal   Collection Time: 09/02/14 11:36 AM  Result Value Ref Range   Glucose-Capillary 271 (H) 70 - 99 mg/dL  Glucose, capillary     Status: Abnormal   Collection Time: 09/02/14  5:00 PM  Result Value Ref Range   Glucose-Capillary 245 (H) 70 - 99 mg/dL  Glucose, capillary     Status: Abnormal   Collection Time: 09/02/14  8:50 PM  Result Value Ref Range   Glucose-Capillary 155 (H) 70 - 99 mg/dL  CBC     Status: Abnormal   Collection Time: 09/03/14  6:30 AM  Result Value Ref Range   WBC 2.9 (L) 4.0 - 10.5 K/uL   RBC 3.79 (L) 4.22 - 5.81 MIL/uL   Hemoglobin 11.3 (L) 13.0 - 17.0 g/dL   HCT 32.6 (L) 39.0 - 52.0 %   MCV 86.0 78.0 - 100.0 fL   MCH 29.8 26.0 - 34.0 pg   MCHC 34.7 30.0 - 36.0 g/dL   RDW 14.6 11.5 - 15.5 %    Platelets 164 150 - 400 K/uL  Basic metabolic panel     Status: Abnormal   Collection Time: 09/03/14  6:30 AM  Result Value Ref Range   Sodium 139 135 - 145 mmol/L   Potassium 3.2 (L) 3.5 - 5.1 mmol/L   Chloride 106 96 - 112 mmol/L   CO2 27 19 - 32 mmol/L   Glucose, Bld 362 (H) 70 - 99 mg/dL   BUN 12 6 - 23 mg/dL   Creatinine, Ser 0.66 0.50 - 1.35 mg/dL   Calcium 8.6 8.4 - 10.5 mg/dL   GFR calc non Af Amer >90 >90 mL/min   GFR calc Af Amer >90 >90 mL/min    Comment: (NOTE) The eGFR has been calculated using the CKD EPI equation. This calculation has not been validated in all clinical situations. eGFR's persistently <90 mL/min signify possible Chronic Kidney Disease.    Anion gap 6 5 - 15  Glucose, capillary     Status: Abnormal   Collection Time: 09/03/14  7:58 AM  Result Value Ref Range   Glucose-Capillary 434 (H) 70 - 99 mg/dL    Vitals: Blood pressure 137/94, pulse 67, temperature 97.6 F (36.4 C), temperature source Oral, resp. rate 18, height _0  (1.676 m), weight 55.293 kg (121 lb 14.4 oz), SpO2 100 %.  Risk to Self: Is patient at risk for suicide?: No Risk to Others:   Prior Inpatient Therapy:   Prior Outpatient Therapy:    Current Facility-Administered Medications  Medication Dose Route Frequency Provider Last Rate Last Dose  . acetaminophen (TYLENOL) tablet 650 mg  650 mg Oral Q6H PRN Blain Pais, MD      . dextrose 50 % solution 25 mL  25 mL Intravenous PRN Blain Pais, MD      . feeding supplement (GLUCERNA SHAKE) (GLUCERNA SHAKE) liquid 237 mL  237 mL Oral  Q supper Kallie Locks, RD   237 mL at 09/02/14 1721  . feeding supplement (PRO-STAT SUGAR FREE 64) liquid 30 mL  30 mL Oral Q lunch Kallie Locks, RD   30 mL at 09/02/14 1038  . insulin aspart (novoLOG) injection 0-15 Units  0-15 Units Subcutaneous TID WC Florencia Reasons, MD      . insulin aspart (novoLOG) injection 0-5 Units  0-5 Units Subcutaneous QHS Blain Pais, MD   3 Units at 09/01/14  2158  . insulin aspart (novoLOG) injection 0-9 Units  0-9 Units Subcutaneous TID WC Blain Pais, MD   9 Units at 09/03/14 0900  . insulin aspart protamine- aspart (NOVOLOG MIX 70/30) injection 22 Units  22 Units Subcutaneous BID WC Kinnie Feil, MD   22 Units at 09/02/14 1721  . ondansetron (ZOFRAN) tablet 4 mg  4 mg Oral Q6H PRN Blain Pais, MD       Or  . ondansetron (ZOFRAN) injection 4 mg  4 mg Intravenous Q6H PRN Blain Pais, MD        Musculoskeletal: Strength & Muscle Tone: within normal limits Gait & Station: normal Patient leans: N/A  Psychiatric Specialty Exam: Physical Examas per history and physical  ROS depression, anxiety and noncompliant with medication management   Blood pressure 137/94, pulse 67, temperature 97.6 F (36.4 C), temperature source Oral, resp. rate 18, height _0  (1.676 m), weight 55.293 kg (121 lb 14.4 oz), SpO2 100 %.Body mass index is 19.68 kg/(m^2).  General Appearance: Guarded  Eye Contact::  Good  Speech:  Clear and Coherent  Volume:  Decreased  Mood:  Anxious and Depressed  Affect:  Appropriate and Congruent  Thought Process:  Coherent and Goal Directed  Orientation:  Full (Time, Place, and Person)  Thought Content:  WDL  Suicidal Thoughts:  No  Homicidal Thoughts:  No  Memory:  Immediate;   Good Recent;   Good  Judgement:  Fair  Insight:  Good  Psychomotor Activity:  Normal  Concentration:  Good  Recall:  Good  Fund of Knowledge:Good  Language: Good  Akathisia:  Negative  Handed:  Right  AIMS (if indicated):     Assets:  Communication Skills Desire for Improvement Housing Intimacy Leisure Time Resilience Social Support  ADL's:  Intact  Cognition: WNL  Sleep:      Medical Decision Making: Review of Psycho-Social Stressors (1), Review or order clinical lab tests (1), Established Problem, Worsening (2), Review or order medicine tests (1), Review of Medication Regimen & Side Effects (2) and Review of  New Medication or Change in Dosage (2)  Treatment Plan Summary: Daily contact with patient to assess and evaluate symptoms and progress in treatment and Medication management  Plan: Case discussed with Dr. Erlinda Hong Patient has no safety concerns during this evaluation Patient does not meet criteria for psychiatric inpatient admission. Supportive therapy provided about ongoing stressors.   Refer to the psychiatric social service regarding outpatient medication management and possibly restenosis in the community for medication assistance Appreciate psychiatric consultation and sign off at this time Please contact [phone] or [phone] if needs further assistance  Disposition: Patient will benefit from the outpatient psychiatric counseling and possible medication management for depression secondary to chronic medical condition  Burech Mcfarland,JANARDHAHA R. 09/03/2014 10:27 AM

## 2014-09-29 ENCOUNTER — Encounter (HOSPITAL_COMMUNITY): Payer: Self-pay | Admitting: Emergency Medicine

## 2014-09-29 DIAGNOSIS — E1010 Type 1 diabetes mellitus with ketoacidosis without coma: Principal | ICD-10-CM | POA: Diagnosis present

## 2014-09-29 DIAGNOSIS — Z87891 Personal history of nicotine dependence: Secondary | ICD-10-CM

## 2014-09-29 DIAGNOSIS — N289 Disorder of kidney and ureter, unspecified: Secondary | ICD-10-CM | POA: Diagnosis present

## 2014-09-29 DIAGNOSIS — E876 Hypokalemia: Secondary | ICD-10-CM | POA: Diagnosis present

## 2014-09-29 DIAGNOSIS — Z9119 Patient's noncompliance with other medical treatment and regimen: Secondary | ICD-10-CM | POA: Diagnosis present

## 2014-09-29 NOTE — ED Notes (Signed)
Pt. reports headache for 3 days , denies head injury , occasional nausea with emesis , denies fever or chills.

## 2014-09-30 ENCOUNTER — Emergency Department (HOSPITAL_COMMUNITY): Payer: Self-pay

## 2014-09-30 ENCOUNTER — Inpatient Hospital Stay (HOSPITAL_COMMUNITY)
Admission: EM | Admit: 2014-09-30 | Discharge: 2014-10-02 | DRG: 639 | Disposition: A | Discharge status: Home / Self Care | Payer: Self-pay | Attending: Pulmonary Disease | Admitting: Pulmonary Disease

## 2014-09-30 ENCOUNTER — Other Ambulatory Visit (HOSPITAL_COMMUNITY): Payer: Self-pay

## 2014-09-30 DIAGNOSIS — E1110 Type 2 diabetes mellitus with ketoacidosis without coma: Secondary | ICD-10-CM | POA: Diagnosis present

## 2014-09-30 DIAGNOSIS — E1010 Type 1 diabetes mellitus with ketoacidosis without coma: Principal | ICD-10-CM

## 2014-09-30 LAB — COMPREHENSIVE METABOLIC PANEL
ALK PHOS: 95 U/L (ref 38–126)
ALT: 17 U/L (ref 17–63)
ANION GAP: 26 — AB (ref 5–15)
AST: 21 U/L (ref 15–41)
Albumin: 4.7 g/dL (ref 3.5–5.0)
BUN: 11 mg/dL (ref 6–20)
CO2: 7 mmol/L — ABNORMAL LOW (ref 22–32)
Calcium: 9.5 mg/dL (ref 8.9–10.3)
Chloride: 101 mmol/L (ref 101–111)
Creatinine, Ser: 1.45 mg/dL — ABNORMAL HIGH (ref 0.61–1.24)
GLUCOSE: 322 mg/dL — AB (ref 70–99)
Potassium: 4 mmol/L (ref 3.5–5.1)
Sodium: 134 mmol/L — ABNORMAL LOW (ref 135–145)
TOTAL PROTEIN: 8.9 g/dL — AB (ref 6.5–8.1)
Total Bilirubin: 1.8 mg/dL — ABNORMAL HIGH (ref 0.3–1.2)

## 2014-09-30 LAB — GLUCOSE, CAPILLARY
GLUCOSE-CAPILLARY: 273 mg/dL — AB (ref 70–99)
GLUCOSE-CAPILLARY: 157 mg/dL — AB (ref 70–99)
GLUCOSE-CAPILLARY: 138 mg/dL — AB (ref 70–99)
GLUCOSE-CAPILLARY: 130 mg/dL — AB (ref 70–99)
GLUCOSE-CAPILLARY: 204 mg/dL — AB (ref 70–99)
Glucose-Capillary: 191 mg/dL — ABNORMAL HIGH (ref 70–99)
Glucose-Capillary: 160 mg/dL — ABNORMAL HIGH (ref 70–99)
Glucose-Capillary: 180 mg/dL — ABNORMAL HIGH (ref 70–99)
Glucose-Capillary: 153 mg/dL — ABNORMAL HIGH (ref 70–99)
Glucose-Capillary: 157 mg/dL — ABNORMAL HIGH (ref 70–99)
Glucose-Capillary: 160 mg/dL — ABNORMAL HIGH (ref 70–99)
Glucose-Capillary: 153 mg/dL — ABNORMAL HIGH (ref 70–99)
Glucose-Capillary: 177 mg/dL — ABNORMAL HIGH (ref 70–99)
Glucose-Capillary: 218 mg/dL — ABNORMAL HIGH (ref 70–99)
Glucose-Capillary: 251 mg/dL — ABNORMAL HIGH (ref 70–99)
Glucose-Capillary: 187 mg/dL — ABNORMAL HIGH (ref 70–99)

## 2014-09-30 LAB — CBG MONITORING, ED
Glucose-Capillary: 511 mg/dL — ABNORMAL HIGH (ref 70–99)
Glucose-Capillary: 422 mg/dL — ABNORMAL HIGH (ref 70–99)
Glucose-Capillary: 370 mg/dL — ABNORMAL HIGH (ref 70–99)

## 2014-09-30 LAB — BLOOD GAS, ARTERIAL
Acid-base deficit: 27.3 mmol/L — ABNORMAL HIGH (ref 0.0–2.0)
Bicarbonate: 2.3 mEq/L — ABNORMAL LOW (ref 20.0–24.0)
Drawn by: 41875
FIO2: 0.21 %
O2 Saturation: 97.3 %
PCO2 ART: 10.1 mmHg — AB (ref 35.0–45.0)
PH ART: 6.993 — AB (ref 7.350–7.450)
Patient temperature: 98.3
TCO2: 2.7 mmol/L (ref 0–100)
pO2, Arterial: 146 mmHg — ABNORMAL HIGH (ref 80.0–100.0)

## 2014-09-30 LAB — BASIC METABOLIC PANEL
ANION GAP: 18 — AB (ref 5–15)
Anion gap: 14 (ref 5–15)
Anion gap: 13 (ref 5–15)
BUN: 11 mg/dL (ref 6–20)
BUN: 8 mg/dL (ref 6–20)
BUN: 7 mg/dL (ref 6–20)
CALCIUM: 7.2 mg/dL — AB (ref 8.9–10.3)
CO2: 7 mmol/L — AB (ref 22–32)
CO2: 8 mmol/L — ABNORMAL LOW (ref 22–32)
CO2: 12 mmol/L — ABNORMAL LOW (ref 22–32)
Calcium: 7.1 mg/dL — ABNORMAL LOW (ref 8.9–10.3)
Calcium: 7.2 mg/dL — ABNORMAL LOW (ref 8.9–10.3)
Chloride: 114 mmol/L — ABNORMAL HIGH (ref 101–111)
Chloride: 115 mmol/L — ABNORMAL HIGH (ref 101–111)
Chloride: 113 mmol/L — ABNORMAL HIGH (ref 101–111)
Creatinine, Ser: 1.18 mg/dL (ref 0.61–1.24)
Creatinine, Ser: 1.05 mg/dL (ref 0.61–1.24)
Creatinine, Ser: 0.95 mg/dL (ref 0.61–1.24)
GFR calc Af Amer: >60 mL/min (ref >60)
GFR calc Af Amer: >60 mL/min (ref >60)
GFR calc non Af Amer: >60 mL/min (ref >60)
GFR calc non Af Amer: >60 mL/min (ref >60)
GLUCOSE: 140 mg/dL — AB (ref 70–99)
Glucose, Bld: 186 mg/dL — ABNORMAL HIGH (ref 70–99)
Glucose, Bld: 168 mg/dL — ABNORMAL HIGH (ref 70–99)
Potassium: 3 mmol/L — ABNORMAL LOW (ref 3.5–5.1)
Potassium: 3.9 mmol/L (ref 3.5–5.1)
Potassium: 2.9 mmol/L — ABNORMAL LOW (ref 3.5–5.1)
SODIUM: 139 mmol/L (ref 135–145)
Sodium: 137 mmol/L (ref 135–145)
Sodium: 138 mmol/L (ref 135–145)

## 2014-09-30 LAB — CBC
HCT: 39.1 % (ref 39.0–52.0)
Hemoglobin: 13.8 g/dL (ref 13.0–17.0)
MCH: 30.9 pg (ref 26.0–34.0)
MCHC: 35.3 g/dL (ref 30.0–36.0)
MCV: 87.5 fL (ref 78.0–100.0)
Platelets: 207 10*3/uL (ref 150–400)
RBC: 4.47 MIL/uL (ref 4.22–5.81)
RDW: 13.4 % (ref 11.5–15.5)
WBC: 12.5 10*3/uL — ABNORMAL HIGH (ref 4.0–10.5)

## 2014-09-30 LAB — CBC WITH DIFFERENTIAL/PLATELET
Basophils Absolute: 0 10*3/uL (ref 0.0–0.1)
Basophils Relative: 0 % (ref 0–1)
EOS ABS: 0 10*3/uL (ref 0.0–0.7)
Eosinophils Relative: 0 % (ref 0–5)
HCT: 46.6 % (ref 39.0–52.0)
Hemoglobin: 16.2 g/dL (ref 13.0–17.0)
LYMPHS PCT: 17 % (ref 12–46)
Lymphs Abs: 1.6 10*3/uL (ref 0.7–4.0)
MCH: 30.6 pg (ref 26.0–34.0)
MCHC: 34.8 g/dL (ref 30.0–36.0)
MCV: 87.9 fL (ref 78.0–100.0)
MONOS PCT: 5 % (ref 3–12)
Monocytes Absolute: 0.5 10*3/uL (ref 0.1–1.0)
Neutro Abs: 7.2 10*3/uL (ref 1.7–7.7)
Neutrophils Relative %: 78 % — ABNORMAL HIGH (ref 43–77)
Platelets: 298 10*3/uL (ref 150–400)
RBC: 5.3 MIL/uL (ref 4.22–5.81)
RDW: 13.4 % (ref 11.5–15.5)
WBC: 9.3 10*3/uL (ref 4.0–10.5)

## 2014-09-30 LAB — URINE MICROSCOPIC-ADD ON

## 2014-09-30 LAB — I-STAT TROPONIN, ED: TROPONIN I, POC: 0 ng/mL (ref 0.00–0.08)

## 2014-09-30 LAB — I-STAT VENOUS BLOOD GAS, ED
Acid-base deficit: 28 mmol/L — ABNORMAL HIGH (ref 0.0–2.0)
Bicarbonate: 3.5 mEq/L — ABNORMAL LOW (ref 20.0–24.0)
O2 Saturation: 92 %
PCO2 VEN: 16.8 mmHg — AB (ref 45.0–50.0)
TCO2: <5 mmol/L (ref 0–100)
pH, Ven: 6.929 — CL (ref 7.250–7.300)
pO2, Ven: 100 mmHg — ABNORMAL HIGH (ref 30.0–45.0)

## 2014-09-30 LAB — RAPID URINE DRUG SCREEN, HOSP PERFORMED
AMPHETAMINES: NOT DETECTED
BARBITURATES: NOT DETECTED
Benzodiazepines: NOT DETECTED
COCAINE: NOT DETECTED
Opiates: NOT DETECTED
TETRAHYDROCANNABINOL: NOT DETECTED

## 2014-09-30 LAB — URINALYSIS, ROUTINE W REFLEX MICROSCOPIC
Bilirubin Urine: NEGATIVE
Ketones, ur: >80 mg/dL — AB
Leukocytes, UA: NEGATIVE
Nitrite: NEGATIVE
PH: 5 (ref 5.0–8.0)
Protein, ur: 100 mg/dL — AB
SPECIFIC GRAVITY, URINE: 1.024 (ref 1.005–1.030)
Urobilinogen, UA: 0.2 mg/dL (ref 0.0–1.0)

## 2014-09-30 LAB — MRSA PCR SCREENING: MRSA by PCR: NEGATIVE

## 2014-09-30 LAB — I-STAT CG4 LACTIC ACID, ED: Lactic Acid, Venous: 2.15 mmol/L (ref 0.5–2.0)

## 2014-09-30 LAB — KETONES, URINE: Ketones, ur: >80 mg/dL — AB

## 2014-09-30 MED ORDER — SODIUM CHLORIDE 0.9 % IV SOLN
INTRAVENOUS | Status: DC
  Administered 2014-09-30: 3.6 [IU]/h via INTRAVENOUS
  Filled 2014-09-30: qty 2.5

## 2014-09-30 MED ORDER — ONDANSETRON HCL 4 MG/2ML IJ SOLN
4.0000 mg | Freq: Once | INTRAMUSCULAR | Status: AC
  Administered 2014-09-30: 4 mg via INTRAVENOUS
  Filled 2014-09-30: qty 2

## 2014-09-30 MED ORDER — SODIUM CHLORIDE 0.9 % IV SOLN: INTRAVENOUS | Status: DC

## 2014-09-30 MED ORDER — SODIUM CHLORIDE 0.9 % IV BOLUS (SEPSIS)
1000.0000 mL | Freq: Once | INTRAVENOUS | Status: AC
  Administered 2014-09-30: 1000 mL via INTRAVENOUS

## 2014-09-30 MED ORDER — INSULIN ASPART 100 UNIT/ML ~~LOC~~ SOLN
10.0000 [IU] | Freq: Once | SUBCUTANEOUS | Status: AC
  Administered 2014-09-30: 5 [IU] via INTRAVENOUS
  Filled 2014-09-30: qty 1

## 2014-09-30 MED ORDER — DEXTROSE-NACL 5-0.45 % IV SOLN
INTRAVENOUS | Status: DC
  Administered 2014-09-30 – 2014-10-01 (×3): via INTRAVENOUS

## 2014-09-30 MED ORDER — SODIUM BICARBONATE 8.4 % IV SOLN
50.0000 meq | Freq: Once | INTRAVENOUS | Status: AC
  Administered 2014-09-30: 50 meq via INTRAVENOUS
  Filled 2014-09-30: qty 50

## 2014-09-30 MED ORDER — ACETAMINOPHEN 325 MG PO TABS
650.0000 mg | ORAL_TABLET | Freq: Four times a day (QID) | ORAL | Status: DC | PRN
  Administered 2014-09-30: 650 mg via ORAL
  Filled 2014-09-30: qty 2

## 2014-09-30 MED ORDER — INSULIN ASPART 100 UNIT/ML ~~LOC~~ SOLN
10.0000 [IU] | Freq: Once | SUBCUTANEOUS | Status: AC
  Administered 2014-09-30: 10 [IU] via SUBCUTANEOUS
  Filled 2014-09-30: qty 1

## 2014-09-30 MED ORDER — HEPARIN SODIUM (PORCINE) 5000 UNIT/ML IJ SOLN
5000.0000 [IU] | Freq: Three times a day (TID) | INTRAMUSCULAR | Status: DC
  Administered 2014-09-30 – 2014-10-02 (×7): 5000 [IU] via SUBCUTANEOUS
  Filled 2014-09-30 (×10): qty 1

## 2014-09-30 MED ORDER — POTASSIUM CHLORIDE 10 MEQ/100ML IV SOLN
10.0000 meq | INTRAVENOUS | Status: AC
  Administered 2014-09-30 (×2): 10 meq via INTRAVENOUS
  Filled 2014-09-30 (×2): qty 100

## 2014-09-30 MED ORDER — SODIUM CHLORIDE 0.9 % IV SOLN
INTRAVENOUS | Status: DC
  Filled 2014-09-30: qty 2.5

## 2014-09-30 MED ORDER — POTASSIUM CHLORIDE 10 MEQ/100ML IV SOLN
10.0000 meq | INTRAVENOUS | Status: AC
  Administered 2014-09-30 (×6): 10 meq via INTRAVENOUS
  Filled 2014-09-30 (×6): qty 100

## 2014-09-30 MED ORDER — SODIUM CHLORIDE 0.9 % IV SOLN
INTRAVENOUS | Status: AC
Start: 2014-09-30 — End: 2014-09-30
  Administered 2014-09-30: 08:00:00 via INTRAVENOUS

## 2014-09-30 NOTE — Progress Notes (Signed)
Utilization review completed.  

## 2014-09-30 NOTE — H&P (Signed)
PULMONARY / CRITICAL CARE MEDICINE   Name: [name] [name] MRN: [id number] DOB: [birth year]    ADMISSION DATE:  09/30/2014 CONSULTATION DATE:  09/30/14  REFERRING MD :  Dr. Norlene Campbelltter / EDP   CHIEF COMPLAINT:  DKA   INITIAL PRESENTATION: 29 y/o M with a PMH of IDDM admitted 5/11 with c/o headache, n/v, & fast HR.  Work up positive for DKA, admitted to ICU.   STUDIES:  5/11 UDS >>   SIGNIFICANT EVENTS: 5/11  Admit with DKA   HISTORY OF PRESENT ILLNESS:  29 y/o M with PMH of renal insufficiency (briefly required HD in 2011) and IDDM who [facility] with complaints of a 3 day history of headache, nausea, vomiting, and sensation of a fast heart rate.  He also reported excessive thirst and urination.  The patient reports he has been compliant with his insulin regimen (30 units of 70/30 BID with meals).  On admit, he denied acute illness, fevers / chills in recent days.    ER work up was notable for Na 134, CO2 7, glucose 322, sr cr 1.45, AG 26, pH 6.993, Ketone positive urine, lactic acid 2.15 and a clear chest xray.  He was noted to be tachycardic with rate in 150's.  Patient was treated with 2L of NS, 10 units and an insulin gtt.  He was admitted to ICU for further treatment.    PAST MEDICAL HISTORY :   has a past medical history of Diabetes mellitus without complication (Dx 2012) and Renal insufficiency (2011).  has no past surgical history on file.   Prior to Admission medications   Medication Sig Start Date End Date Taking? Authorizing Provider  insulin aspart protamine- aspart (NOVOLOG MIX 70/30) (70-30) 100 UNIT/ML injection Inject 0.3 mLs (30 Units total) into the skin 2 (two) times daily with a meal. 08/15/14  Yes Osvaldo ShipperGokul Krishnan, MD  atorvastatin (LIPITOR) 20 MG tablet Take 1 tablet (20 mg total) by mouth daily. Patient not taking: Reported on 09/30/2014 09/03/14   Albertine GratesFang Xu, MD  insulin aspart (NOVOLOG) 100 UNIT/ML injection Inject 0-9 Units into the skin 3 (three)  times daily with meals. Sliding scale Patient not taking: Reported on 08/30/2014 08/15/14   Osvaldo ShipperGokul Krishnan, MD  NEEDLE, DISP, 22 G (BD DISP NEEDLES) 22G X 1-1/2" MISC 5 Units by Does not apply route 4 (four) times daily -  with meals and at bedtime. 07/02/14   Drema Dallasurtis J Woods, MD  Syringe, Disposable, 1 ML MISC 5 Units by Does not apply route 4 (four) times daily -  with meals and at bedtime. 07/02/14   Drema Dallasurtis J Woods, MD  TRUEPLUS LANCETS 28G MISC 1 each by Does not apply route 3 (three) times daily. Patient not taking: Reported on 06/30/2014 05/04/14   Dessa PhiJosalyn Funches, MD   No Known Allergies  FAMILY HISTORY:  indicated that his mother is alive. He indicated that his father is deceased.    SOCIAL HISTORY:  reports that he quit smoking about a year ago. He has never used smokeless tobacco. He reports that he does not drink alcohol or use illicit drugs.  REVIEW OF SYSTEMS:  Gen: Denies fever, chills, weight change, fatigue, night sweats HEENT: Denies blurred vision, double vision, hearing loss, tinnitus, sinus congestion, rhinorrhea, sore throat, neck stiffness, dysphagia PULM: Denies cough, sputum production, hemoptysis, wheezing.  Reported brief shortness of breath prior to admit CV: Denies chest pain, edema, orthopnea, paroxysmal nocturnal dyspnea, palpitations.  Reports "fast heart rate"  GI: Denies abdominal pain, diarrhea, hematochezia, melena, constipation, change in bowel habits.  Reports nausea / vomiting GU: Denies dysuria, hematuria, polyuria, oliguria, urethral discharge Endocrine: Denies hot or cold intolerance, polyuria, polyphagia or appetite change Derm: Denies rash, dry skin, scaling or peeling skin change Heme: Denies easy bruising, bleeding, bleeding gums Neuro: Denies headache, numbness, weakness, slurred speech, loss of memory or consciousness  SUBJECTIVE: Pt reports feeling much better than when he came in to hospital  VITAL SIGNS: Temp:  [97.6 F (36.4 C)-98.3 F (36.8  C)] 97.6 F (36.4 C) (05/11 0603) Pulse Rate:  [89-140] 89 (05/11 0830) Resp:  [14-32] 14 (05/11 0830) BP: (121-164)/(73-110) 130/75 mmHg (05/11 0830) SpO2:  [100 %] 100 % (05/11 0830) Weight:  [112 lb (50.803 kg)-114 lb 6.7 oz (51.9 kg)] 114 lb 6.7 oz (51.9 kg) (05/11 0700)   HEMODYNAMICS:     INTAKE / OUTPUT:  Intake/Output Summary (Last 24 hours) at 09/30/14 0859 Last data filed at 09/30/14 0801  Gross per 24 hour  Intake   3100 ml  Output   1300 ml  Net   1800 ml    PHYSICAL EXAMINATION: General:  Thin adult male in NAD  Neuro:  AAOx4, speech clear, MAE  HEENT:  MM pink/dry, no jvd Cardiovascular:  s1s2 rrr, no m/r/g Lungs:  resp's even/non-labored, lungs bilaterally clear  Abdomen:  Flat, NTND, bsx4 active  Musculoskeletal:  No acute deformities  Skin:  Warm/dry, no edema   LABS:  CBC  Recent Labs Lab 09/30/14 0007  WBC 9.3  HGB 16.2  HCT 46.6  PLT 298   BMET  Recent Labs Lab 09/30/14 0007  NA 134*  K 4.0  CL 101  CO2 7*  BUN 11  CREATININE 1.45*  GLUCOSE 322*   Electrolytes  Recent Labs Lab 09/30/14 0007  CALCIUM 9.5   Sepsis Markers  Recent Labs Lab 09/30/14 0340  LATICACIDVEN 2.15*   ABG  Recent Labs Lab 09/30/14 0320  PHART 6.993*  PCO2ART 10.1*  PO2ART 146*   Liver Enzymes  Recent Labs Lab 09/30/14 0007  AST 21  ALT 17  ALKPHOS 95  BILITOT 1.8*  ALBUMIN 4.7   Glucose  Recent Labs Lab 09/30/14 0404 09/30/14 0438 09/30/14 0543 09/30/14 0706 09/30/14 0816  GLUCAP 511* 422* 370* 273* 191*    Imaging No results found.   ASSESSMENT / PLAN:  ENDOCRINE A:   DKA  IDDM    P:   NS @ 125 ml/hr Insulin gtt per DKA protocol  Trend BMP  Consult DM Coordinator   GASTROINTESTINAL A:   Nausea / Vomiting  P:   PRN zofran  NPO x ice chips for now  RENAL A:   AG Metabolic Acidosis - in the setting of DKA  Hypokalemia  Acute Renal Insufficiency  P:   DKA protocol as above  Trend BMP Hold  nephrotoxic agents  Replace electrolytes as indicated  KCL 20 mEq x1  PULMONARY A: No acute issues  P:   Pulmonary hygiene  CARDIOVASCULAR A: Tachycardia - in the setting of DKA, resolved.  P:  Volume resuscitation as above  INFECTIOUS A:   Leukocytosis P:   UA 5/11 >>   Monitor fever curve / leukocytosis  NEUROLOGIC A:   Headache - likely secondary to volume depletion, DKA.  Resolved.  P:   Monitor  PRN tylenol   FAMILY  - Updates: Patient updated at bedside, no family present   Canary Brim, NP-C [facility] Pgr:  [phone] or [phone] 09/30/2014, 8:59 AM   PCCM ATTENDING: I have reviewed pt's initial presentation, consultants notes and hospital database in detail.  The above assessment and plan was formulated under my direction.   Billy Fischeravid Atavia Poppe, MD;  PCCM service; Mobile [phone]

## 2014-09-30 NOTE — Progress Notes (Signed)
Spoke with patient.  Asked him if he was [facility]. He states that he followed up about a week and 1/2 ago, however there [facility]  He states that he was taking medications and had insulin?  States that the reason he came to the hospital was b/c of a constant headache.  Note that glucose was 322 mg/dL with a CO2 of 7 on admit.  Will follow. Remains on insulin drip/DKA protocol.  Thanks, Beryl MeagerJenny Tennessee Hanlon, RN, BC-ADM Inpatient Diabetes Coordinator Pager [phone] (8a-5p)

## 2014-09-30 NOTE — ED Notes (Signed)
Pt to Nurse First desk c/o fast heart rate and shortness of breath. Hx of DKA. Reports tachycardia started ago. EKG completed HR 120

## 2014-09-30 NOTE — Progress Notes (Signed)
eLink Physician-Brief Progress Note Patient Name: [name] [name] DOB: [birth year] MRN: [id number]   Date of Service  09/30/2014  HPI/Events of Note  Called for admission for DKA   eICU Interventions  Will place admit orders for DKA On ground PCCM team to evaluate     Intervention Category Minor Interventions: Routine modifications to care plan (e.g. PRN medications for pain, fever);Electrolytes abnormality - evaluation and management  MCQUAID, DOUGLAS 09/30/2014, 5:44 AM

## 2014-09-30 NOTE — Care Management Note (Addendum)
Case Management Note  Patient Details  Name: [name] [name] MRN: [id number] Date of Birth: Oct 20, 1985  Subjective/Objective:       Pt admitted with DKE             Action/Plan: PTA pt lived at home- on last admission [facility]u under [facility]- [facility] NCM will f/u for d/c needs  Expected Discharge Date:                  Expected Discharge Plan:  Home/Self Care  In-House Referral:     Discharge planning Services  CM Consult  Post Acute Care Choice:    Choice offered to:     DME Arranged:    DME Agency:     HH Arranged:    HH Agency:     Status of Service:  In process, will continue to follow  Medicare Important Message Given:    Date Medicare IM Given:    Medicare IM give by:    Date Additional Medicare IM Given:    Additional Medicare Important Message give by:     If discussed at Long Length of Stay Meetings, dates discussed:    Additional Comments:  Darrold SpanWebster, Kol Consuegra Hall, RN 09/30/2014, 9:58 AM

## 2014-09-30 NOTE — ED Provider Notes (Signed)
CSN: [id number]     Arrival date & time 09/29/14  2341 History   First MD Initiated Contact with Patient 09/30/14 0305     Chief Complaint  Patient presents with  . Headache    (Consider location/radiation/quality/duration/timing/severity/associated sxs/prior Treatment) HPI Comments: Patient is a 29 year old male with a history of IDDM with known noncompliance as well as renal insufficiency in 2011. She presents to the emergency department today for further evaluation of headache 2 days. He reports that the headache is temporal and constant. He has had associated nausea as well as 2 episodes of emesis today. Patient reports polydipsia without polyuria. He began to feel short of breath with palpitations while waiting in the waiting room this evening. He reports that he has been compliant with his insulin regimen. He is supposed to take 30 units of 70/30 insulin twice a day with meals. Patient denies head injury or trauma, fever, chills, and diarrhea.  PCP - Dr. Armen Pickup  Patient is a 29 y.o. male presenting with headaches. The history is provided by the patient. No language interpreter was used.  Headache Associated symptoms: fatigue and vomiting (x 2)   Associated symptoms: no diarrhea and no fever     Past Medical History  Diagnosis Date  . Diabetes mellitus without complication Dx 2012  . Renal insufficiency 2011    pt was on dialysis for a short time approx 2011, pt is no longer on dialysis   History reviewed. No pertinent past surgical history. Family History  Problem Relation Age of Onset  . Diabetes Father   . Diabetes Maternal Grandmother   . Heart disease Neg Hx   . Hypertension Neg Hx    History  Substance Use Topics  . Smoking status: Former Smoker    Quit date: 09/19/2013  . Smokeless tobacco: Never Used  . Alcohol Use: No    Review of Systems  Constitutional: Positive for fatigue. Negative for fever.  Respiratory: Positive for shortness of breath.    Gastrointestinal: Positive for vomiting (x 2). Negative for diarrhea.  Endocrine: Positive for polydipsia. Negative for polyuria.  Neurological: Positive for headaches. Negative for syncope.  All other systems reviewed and are negative.   Allergies  Review of patient's allergies indicates no known allergies.  Home Medications   Prior to Admission medications   Medication Sig Start Date End Date Taking? Authorizing Provider  insulin aspart protamine- aspart (NOVOLOG MIX 70/30) (70-30) 100 UNIT/ML injection Inject 0.3 mLs (30 Units total) into the skin 2 (two) times daily with a meal. 08/15/14  Yes Osvaldo Shipper, MD  atorvastatin (LIPITOR) 20 MG tablet Take 1 tablet (20 mg total) by mouth daily. Patient not taking: Reported on 09/30/2014 09/03/14   Albertine Grates, MD  insulin aspart (NOVOLOG) 100 UNIT/ML injection Inject 0-9 Units into the skin 3 (three) times daily with meals. Sliding scale Patient not taking: Reported on 08/30/2014 08/15/14   Osvaldo Shipper, MD  NEEDLE, DISP, 22 G (BD DISP NEEDLES) 22G X 1-1/2" MISC 5 Units by Does not apply route 4 (four) times daily -  with meals and at bedtime. 07/02/14   Drema Dallas, MD  Syringe, Disposable, 1 ML MISC 5 Units by Does not apply route 4 (four) times daily -  with meals and at bedtime. 07/02/14   Drema Dallas, MD  TRUEPLUS LANCETS 28G MISC 1 each by Does not apply route 3 (three) times daily. Patient not taking: Reported on 06/30/2014 05/04/14   Dessa Phi, MD   BP  153/90 mmHg  Pulse 114  Temp(Src) 98.3 F (36.8 C) (Oral)  Resp 16  Ht 5\' 8"  (1.727 m)  Wt 112 lb (50.803 kg)  BMI 17.03 kg/m2  SpO2 100%   Physical Exam  Constitutional: He is oriented to person, place, and time. He appears well-developed and well-nourished. No distress.  Patient appears fatigued.   HENT:  Head: Normocephalic and atraumatic.  Dry mm  Eyes: Conjunctivae and EOM are normal. No scleral icterus.  Neck: Normal range of motion.  Cardiovascular: Regular  rhythm and intact distal pulses.   Tachycardic rate  Pulmonary/Chest: No respiratory distress. He has no wheezes. He has no rales.  Mild tachypnea. Lungs clear. Chest expansion symmetric.  Musculoskeletal: Normal range of motion.  Neurological: He is alert and oriented to person, place, and time. No cranial nerve deficit. He exhibits normal muscle tone. Coordination normal.  GCS 15. Speech is goal oriented. Patient moves extremities without ataxia. No focal neurologic deficits appreciated.  Skin: Skin is warm and dry. No rash noted. He is not diaphoretic. No erythema. No pallor.  Psychiatric: He has a normal mood and affect. His behavior is normal.  Nursing note and vitals reviewed.   ED Course  Procedures (including critical care time) Labs Review Labs Reviewed  CBC WITH DIFFERENTIAL/PLATELET - Abnormal; Notable for the following:    Neutrophils Relative % 78 (*)    All other components within normal limits  COMPREHENSIVE METABOLIC PANEL - Abnormal; Notable for the following:    Sodium 134 (*)    CO2 7 (*)    Glucose, Bld 322 (*)    Creatinine, Ser 1.45 (*)    Total Protein 8.9 (*)    Total Bilirubin 1.8 (*)    Anion gap 26 (*)    All other components within normal limits  BLOOD GAS, ARTERIAL - Abnormal; Notable for the following:    pH, Arterial 6.993 (*)    pCO2 arterial 10.1 (*)    pO2, Arterial 146 (*)    Bicarbonate 2.3 (*)    Acid-base deficit 27.3 (*)    All other components within normal limits  URINALYSIS, ROUTINE W REFLEX MICROSCOPIC - Abnormal; Notable for the following:    Color, Urine STRAW (*)    APPearance CLOUDY (*)    Glucose, UA >1000 (*)    Hgb urine dipstick TRACE (*)    Ketones, ur >80 (*)    Protein, ur 100 (*)    All other components within normal limits  KETONES, URINE - Abnormal; Notable for the following:    Ketones, ur >80 (*)    All other components within normal limits  URINE MICROSCOPIC-ADD ON - Abnormal; Notable for the following:     Squamous Epithelial / LPF FEW (*)    Bacteria, UA FEW (*)    Casts GRANULAR CAST (*)    All other components within normal limits  I-STAT CG4 LACTIC ACID, ED - Abnormal; Notable for the following:    Lactic Acid, Venous 2.15 (*)    All other components within normal limits  CBG MONITORING, ED - Abnormal; Notable for the following:    Glucose-Capillary 511 (*)    All other components within normal limits  CBG MONITORING, ED - Abnormal; Notable for the following:    Glucose-Capillary 422 (*)    All other components within normal limits  I-STAT VENOUS BLOOD GAS, ED - Abnormal; Notable for the following:    pH, Ven 6.929 (*)    pCO2, Ven 16.8 (*)  pO2, Ven 100.0 (*)    Bicarbonate 3.5 (*)    Acid-base deficit 28.0 (*)    All other components within normal limits  I-STAT TROPOININ, ED  CBG MONITORING, ED  CBG MONITORING, ED  I-STAT CHEM 8, ED  I-STAT CG4 LACTIC ACID, ED    Imaging Review Dg Chest Port 1 View  09/30/2014   CLINICAL DATA:  Diabetic ketoacidosis.  Headache.  EXAM: PORTABLE CHEST - 1 VIEW  COMPARISON:  08/13/2014  FINDINGS: The heart size and mediastinal contours are within normal limits. Both lungs are clear. The visualized skeletal structures are unremarkable.  IMPRESSION: No active disease.   Electronically Signed   By: Burman NievesWilliam  Stevens M.D.   On: 09/30/2014 03:28     EKG Interpretation   Date/Time:  Wednesday Sep 30 2014 03:35:07 EDT Ventricular Rate:  108 PR Interval:  140 QRS Duration: 80 QT Interval:  340 QTC Calculation: 456 R Axis:   78 Text Interpretation:  Sinus tachycardia Anteroseptal infarct, old  Nonspecific T abnormalities, lateral leads No significant change since  last tracing Confirmed by OTTER  MD, OLGA ([id number]) on 09/30/2014 5:41:00 AM       CRITICAL CARE Performed by: Antony MaduraHUMES, Airyonna Franklyn   Total critical care time: 45  Critical care time was exclusive of separately billable procedures and treating other patients.  Critical care was  necessary to treat or prevent imminent or life-threatening deterioration.  Critical care was time spent personally by me on the following activities: development of treatment plan with patient and/or surrogate as well as nursing, discussions with consultants, evaluation of patient's response to treatment, examination of patient, obtaining history from patient or surrogate, ordering and performing treatments and interventions, ordering and review of laboratory studies, ordering and review of radiographic studies, pulse oximetry and re-evaluation of patient's condition.  MDM   Final diagnoses:  DKA (diabetic ketoacidoses)    29 year old male with history of type 1 diabetes mellitus with a history of noncompliance presents to the emergency department for further evaluation of headache. He is afebrile and without nuchal rigidity or meningismus. Headache has been present for 2 days and neurologic exam is nonfocal. Doubt emergent intracranial process. Patient found to be in DKA on evaluation. Bicarbonate of 2.3 on ABG with a pH of 6.99. Lactate elevated to 2.15. Will consult PCCM.  [id number] - Dr. Kendrick FriesMcQuaid of PCCM recommends repeat VBG in 1 hour after initiation of IVF and insulin. If improved, he recommends admit to [facility] service.  [id number] - PCCM again consulted. Little improvement in pH and bicarb despite 2L IVF (3rd infusing) and 10 units insulin with insulin drip. HR continues to be tachycardic, now 150's. PCCM has agreed to admission to ICU.   Filed Vitals:   09/30/14 0445 09/30/14 0500 09/30/14 0515 09/30/14 0530  BP: 154/82 121/82 141/110 134/88  Pulse: 120 115 120 110  Temp:      TempSrc:      Resp: 24 24 28 21   Height:      Weight:      SpO2: 100% 100% 100% 100%    Antony MaduraKelly Wyman Meschke, PA-C 09/30/14 0600  Antony MaduraKelly Gilda Abboud, PA-C 09/30/14 [id number]  Marisa Severinlga Otter, MD 09/30/14 [phone]

## 2014-09-30 NOTE — ED Notes (Signed)
CBG 511 

## 2014-09-30 NOTE — Progress Notes (Signed)
eLink Physician-Brief Progress Note Patient Name: [name] [name] DOB: 12/14/1985 MRN: [id number]   Date of Service  09/30/2014  HPI/Events of Note  K+ = 2.9 and Creatinine = 0.95.  eICU Interventions  Will replete K+.      Intervention Category Intermediate Interventions: Electrolyte abnormality - evaluation and management  [name],[name] Eugene 09/30/2014, 6:09 PM

## 2014-09-30 NOTE — Progress Notes (Signed)
Inpatient Diabetes Program Recommendations  AACE/ADA: New Consensus Statement on Inpatient Glycemic Control (2013)  Target Ranges:  Prepandial:   less than 140 mg/dL      Peak postprandial:   less than 180 mg/dL (1-2 hours)      Critically ill patients:  140 - 180 mg/dL   Reason for Assessment: Readmitted for DKA.  Note that patient has 7 admits in the past 7 months.  Per chart review it does [facility] appear [facility].   Per notes from last admission, patient is very overwhelmed with diagnosis.  He had psychiatric evaluation which recommended per Dr. 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  Will discuss with patient.  Patient needs to get proper support in order to prevent readmissions in the future.  DKA protocol in place.  Will follow.  Thanks,  Beryl MeagerJenny Nieves Barberi, RN, BC-ADM Inpatient Diabetes Coordinator Pager [phone] (8a-5p)

## 2014-09-30 NOTE — ED Notes (Signed)
Shown latic acid to DR.OTTER  2.15

## 2014-10-01 LAB — BASIC METABOLIC PANEL
ANION GAP: 9 (ref 5–15)
Anion gap: 11 (ref 5–15)
BUN: 5 mg/dL — ABNORMAL LOW (ref 6–20)
CALCIUM: 7.8 mg/dL — AB (ref 8.9–10.3)
CO2: 14 mmol/L — AB (ref 22–32)
CO2: 15 mmol/L — AB (ref 22–32)
CREATININE: 0.76 mg/dL (ref 0.61–1.24)
Calcium: 7.3 mg/dL — ABNORMAL LOW (ref 8.9–10.3)
Chloride: 113 mmol/L — ABNORMAL HIGH (ref 101–111)
Chloride: 111 mmol/L (ref 101–111)
Creatinine, Ser: 0.78 mg/dL (ref 0.61–1.24)
GFR calc Af Amer: >60 mL/min (ref >60)
GFR calc Af Amer: >60 mL/min (ref >60)
GLUCOSE: 191 mg/dL — AB (ref 65–99)
Glucose, Bld: 125 mg/dL — ABNORMAL HIGH (ref 65–99)
POTASSIUM: 3 mmol/L — AB (ref 3.5–5.1)
Potassium: 3.5 mmol/L (ref 3.5–5.1)
SODIUM: 136 mmol/L (ref 135–145)
Sodium: 137 mmol/L (ref 135–145)

## 2014-10-01 LAB — PHOSPHORUS: PHOSPHORUS: 1.1 mg/dL — AB (ref 2.5–4.6)

## 2014-10-01 LAB — CBC
HEMATOCRIT: 37.7 % — AB (ref 39.0–52.0)
Hemoglobin: 13.5 g/dL (ref 13.0–17.0)
MCH: 30.3 pg (ref 26.0–34.0)
MCHC: 35.8 g/dL (ref 30.0–36.0)
MCV: 84.7 fL (ref 78.0–100.0)
Platelets: 176 10*3/uL (ref 150–400)
RBC: 4.45 MIL/uL (ref 4.22–5.81)
RDW: 13.3 % (ref 11.5–15.5)
WBC: 3.8 10*3/uL — ABNORMAL LOW (ref 4.0–10.5)

## 2014-10-01 LAB — GLUCOSE, CAPILLARY
GLUCOSE-CAPILLARY: 171 mg/dL — AB (ref 65–99)
GLUCOSE-CAPILLARY: 98 mg/dL (ref 65–99)
GLUCOSE-CAPILLARY: 97 mg/dL (ref 65–99)
GLUCOSE-CAPILLARY: 165 mg/dL — AB (ref 65–99)
GLUCOSE-CAPILLARY: 137 mg/dL — AB (ref 65–99)
Glucose-Capillary: 100 mg/dL — ABNORMAL HIGH (ref 65–99)
Glucose-Capillary: 143 mg/dL — ABNORMAL HIGH (ref 65–99)
Glucose-Capillary: 125 mg/dL — ABNORMAL HIGH (ref 65–99)
Glucose-Capillary: 146 mg/dL — ABNORMAL HIGH (ref 65–99)
Glucose-Capillary: 180 mg/dL — ABNORMAL HIGH (ref 65–99)
Glucose-Capillary: 215 mg/dL — ABNORMAL HIGH (ref 65–99)
Glucose-Capillary: 220 mg/dL — ABNORMAL HIGH (ref 65–99)
Glucose-Capillary: 137 mg/dL — ABNORMAL HIGH (ref 65–99)
Glucose-Capillary: 146 mg/dL — ABNORMAL HIGH (ref 65–99)
Glucose-Capillary: 255 mg/dL — ABNORMAL HIGH (ref 65–99)
Glucose-Capillary: 173 mg/dL — ABNORMAL HIGH (ref 65–99)

## 2014-10-01 LAB — MAGNESIUM: Magnesium: 1.7 mg/dL (ref 1.7–2.4)

## 2014-10-01 MED ORDER — INSULIN ASPART 100 UNIT/ML ~~LOC~~ SOLN: 0.0000 [IU] | Freq: Three times a day (TID) | SUBCUTANEOUS | Status: DC

## 2014-10-01 MED ORDER — K PHOS MONO-SOD PHOS DI & MONO 155-852-130 MG PO TABS
500.0000 mg | ORAL_TABLET | Freq: Two times a day (BID) | ORAL | Status: DC
  Administered 2014-10-01 – 2014-10-02 (×3): 500 mg via ORAL
  Filled 2014-10-01 (×4): qty 2

## 2014-10-01 MED ORDER — INSULIN ASPART 100 UNIT/ML ~~LOC~~ SOLN: 0.0000 [IU] | Freq: Every day | SUBCUTANEOUS | Status: DC

## 2014-10-01 MED ORDER — ATORVASTATIN CALCIUM 20 MG PO TABS
20.0000 mg | ORAL_TABLET | Freq: Every day | ORAL | Status: DC
  Administered 2014-10-01 – 2014-10-02 (×2): 20 mg via ORAL
  Filled 2014-10-01 (×2): qty 1

## 2014-10-01 MED ORDER — POTASSIUM CHLORIDE 10 MEQ/100ML IV SOLN
10.0000 meq | INTRAVENOUS | Status: AC
  Administered 2014-10-01 (×4): 10 meq via INTRAVENOUS
  Filled 2014-10-01 (×4): qty 100

## 2014-10-01 MED ORDER — INSULIN ASPART 100 UNIT/ML ~~LOC~~ SOLN
3.0000 [IU] | Freq: Three times a day (TID) | SUBCUTANEOUS | Status: DC
  Administered 2014-10-01 – 2014-10-02 (×4): 3 [IU] via SUBCUTANEOUS

## 2014-10-01 MED ORDER — INSULIN GLARGINE 100 UNIT/ML ~~LOC~~ SOLN
30.0000 [IU] | Freq: Every day | SUBCUTANEOUS | Status: DC
Start: 2014-10-01 — End: 2014-10-02
  Administered 2014-10-01 – 2014-10-02 (×2): 30 [IU] via SUBCUTANEOUS
  Filled 2014-10-01 (×2): qty 0.3

## 2014-10-01 MED ORDER — INSULIN ASPART 100 UNIT/ML ~~LOC~~ SOLN
0.0000 [IU] | Freq: Three times a day (TID) | SUBCUTANEOUS | Status: DC
  Administered 2014-10-01: 2 [IU] via SUBCUTANEOUS
  Administered 2014-10-01: 8 [IU] via SUBCUTANEOUS
  Administered 2014-10-02: 5 [IU] via SUBCUTANEOUS
  Administered 2014-10-02: 3 [IU] via SUBCUTANEOUS

## 2014-10-01 MED ORDER — INSULIN ASPART PROT & ASPART (70-30 MIX) 100 UNIT/ML ~~LOC~~ SUSP: 20.0000 [IU] | Freq: Every day | SUBCUTANEOUS | Status: DC

## 2014-10-01 MED ORDER — POTASSIUM CHLORIDE CRYS ER 20 MEQ PO TBCR
40.0000 meq | EXTENDED_RELEASE_TABLET | Freq: Once | ORAL | Status: AC
  Administered 2014-10-01: 40 meq via ORAL
  Filled 2014-10-01: qty 2

## 2014-10-01 MED ORDER — SUGAMMADEX SODIUM 200 MG/2ML IV SOLN
INTRAVENOUS | Status: AC
  Filled 2014-10-01: qty 2

## 2014-10-01 NOTE — Progress Notes (Signed)
No distress No new complaints  Filed Vitals:   10/01/14 1134 10/01/14 1200 10/01/14 1300 10/01/14 1400  BP:      Pulse:  73 86 95  Temp: 97.6 F (36.4 C)     TempSrc: Oral     Resp:  16 15 15   Height:      Weight:      SpO2:  100% 100% 100%   NAD HEENT WNL Chest clear RRR s M NABS, soft Ext warm, no edema  BMET    Component Value Date/Time   NA 137 10/01/2014 0550   K 3.5 10/01/2014 0550   CL 111 10/01/2014 0550   CO2 15* 10/01/2014 0550   GLUCOSE 191* 10/01/2014 0550   BUN <5* 10/01/2014 0550   CREATININE 0.76 10/01/2014 0550   CALCIUM 7.8* 10/01/2014 0550   GFRNONAA >60 10/01/2014 0550   GFRAA >60 10/01/2014 0550    CBC    Component Value Date/Time   WBC 3.8* 10/01/2014 0554   RBC 4.45 10/01/2014 0554   HGB 13.5 10/01/2014 0554   HCT 37.7* 10/01/2014 0554   PLT 176 10/01/2014 0554   MCV 84.7 10/01/2014 0554   MCH 30.3 10/01/2014 0554   MCHC 35.8 10/01/2014 0554   RDW 13.3 10/01/2014 0554   LYMPHSABS 1.6 09/30/2014 0007   MONOABS 0.5 09/30/2014 0007   EOSABS 0.0 09/30/2014 0007   BASOSABS 0.0 09/30/2014 0007    No new CXR  IMPRESSION: Type I DM - followed by Dr Armen PickupFunches DKA - exact trigger is not clear  Anion gap resolved 5/12 Hypokalemia  Hypophos   PLAN/REC: Transition off insulin gtt to Lantus + SSI Monitor BMET intermittently Monitor I/Os Correct electrolytes - see orders Transfer to med-surg Likely DC home 5/13 - to remain on PCCM service  Billy Fischeravid Simonds, MD ; [facility] [phone].  After 5:30 PM or weekends, call [phone]

## 2014-10-01 NOTE — Progress Notes (Signed)
eLink Physician-Brief Progress Note Patient Name: [name] [name] DOB: 1985-08-01 MRN: [id number]  Lab alert   Recent Labs Lab 09/30/14 0007 09/30/14 0855 09/30/14 1125 09/30/14 1617 10/01/14 0030  NA 134* 139 137 138 136  K 4.0 3.0* 3.9 2.9* 3.0*  CL 101 114* 115* 113* 113*  CO2 7* 7* 8* 12* 14*  GLUCOSE 322* 186* 168* 140* 125*  BUN 11 11 8 7  5*  CREATININE 1.45* 1.18 1.05 0.95 0.78  CALCIUM 9.5 7.2* 7.1* 7.2* 7.3*   A) Low K  P KCL x 4 runs Check mag and phos    Intervention Category Major Interventions: Electrolyte abnormality - evaluation and management  [name] 10/01/2014, 1:07 AM

## 2014-10-02 LAB — BASIC METABOLIC PANEL
Anion gap: 11 (ref 5–15)
CALCIUM: 8.2 mg/dL — AB (ref 8.9–10.3)
CHLORIDE: 103 mmol/L (ref 101–111)
CO2: 21 mmol/L — ABNORMAL LOW (ref 22–32)
Creatinine, Ser: 0.6 mg/dL — ABNORMAL LOW (ref 0.61–1.24)
GLUCOSE: 264 mg/dL — AB (ref 65–99)
Potassium: 2.9 mmol/L — ABNORMAL LOW (ref 3.5–5.1)
Sodium: 135 mmol/L (ref 135–145)

## 2014-10-02 LAB — CBC
HCT: 37.7 % — ABNORMAL LOW (ref 39.0–52.0)
Hemoglobin: 13.9 g/dL (ref 13.0–17.0)
MCH: 31.1 pg (ref 26.0–34.0)
MCHC: 36.9 g/dL — AB (ref 30.0–36.0)
MCV: 84.3 fL (ref 78.0–100.0)
Platelets: 154 10*3/uL (ref 150–400)
RBC: 4.47 MIL/uL (ref 4.22–5.81)
RDW: 13.2 % (ref 11.5–15.5)
WBC: 3.7 10*3/uL — ABNORMAL LOW (ref 4.0–10.5)

## 2014-10-02 LAB — GLUCOSE, CAPILLARY
GLUCOSE-CAPILLARY: 195 mg/dL — AB (ref 65–99)
Glucose-Capillary: 226 mg/dL — ABNORMAL HIGH (ref 65–99)

## 2014-10-02 MED ORDER — INSULIN ASPART PROT & ASPART (70-30 MIX) 100 UNIT/ML ~~LOC~~ SUSP: 28.0000 [IU] | Freq: Two times a day (BID) | SUBCUTANEOUS | Status: DC

## 2014-10-02 MED ORDER — POTASSIUM CHLORIDE ER 10 MEQ PO TBCR: 10.0000 meq | EXTENDED_RELEASE_TABLET | Freq: Every day | ORAL | Status: DC

## 2014-10-02 NOTE — Discharge Summary (Signed)
Physician Discharge Summary  Patient ID: [name] [name] MRN: [id number] DOB/AGE: 08/09/1985 29 y.o.  Admit date: 09/30/2014 Discharge date: 10/02/2014    Discharge Diagnoses:  DKA  IDDM  Nausea / Vomiting  Anion Gap Metabolic Acidosis  Hypokalemia  Acute Renal Insufficiency  Tachycardia  Leukocytosis  Headache                                                                      DISCHARGE PLAN BY DIAGNOSIS     DKA - in setting of nausea/vomiting and decreased PO intake.  IDDM   Discharge Plan: 70/30 28 units BID, begin PM of 5/13 - dose adjusted with weight loss.   Follow up with [facility] as arranged, importance of keeping appt stressed  Nausea / Vomiting   Discharge Plan: Resolved, no acute follow up at this time.   Anion Gap Metabolic Acidosis  Hypokalemia  Acute Renal Insufficiency   Discharge Plan: Follow up with [facility], BMP will need to be assessed 5/20 KCL 10 mEQ daily until seen at Clinic   Tachycardia   Discharge Plan: Resolved, no acute follow up at this time.   Leukocytosis   Discharge Plan: Resolved, no acute follow up.   Headache   Discharge Plan: Resolved, no acute follow up at this time. Thought to be related to volume depletion.                    DISCHARGE SUMMARY   [name] [name] is a 29 y.o. y/o male with a PMH of renal insufficiency (briefly required HD in 2011) and IDDM who presented to the [facility] ER on 5/11 with complaints of a 3 day history of headache, nausea, vomiting, and sensation of a fast heart rate. He also reported excessive thirst and urination. The patient reported he had been compliant with his insulin regimen (30 units of 70/30 BID with meals). On admit, he denied acute illness, fevers / chills in recent days.   ER work up was notable for Na 134, CO2 7, glucose 322, sr cr 1.45, AG 26, pH 6.993, Ketone positive urine, lactic acid 2.15 and a clear chest xray. He was noted to be tachycardic with  rate in 150's. Patient was treated with 2L of NS, 10 units and an insulin gtt. He was admitted to ICU for further treatment. Serial BMP's were monitored, patient was aggressively volume resuscitated and glucose controlled with insulin gtt.  Anion gap was monitored. Upon closure, he was transitioned off insulin to sliding scale.  The patient endorses he was properly taking his insulin but had decreased appetite prior to admit.  Hospital course complicated by hypokalemia which was replaced.  The patient was seen by the Diabetes Coordinator during admit.  Regimen was reviewed with coordinator prior to discharge.  He was medically stable 5/13 and cleared for discharge with plans as above.             CONSULTS Diabetes Coordinator.     Discharge Exam: General: thin adult male in NAD Neuro: AAOx4, speech clear, MAE CV: s1s2 rrr, no m/r/g PULM: resp's even/non-labored, lungs bilaterally clear GI: flat, NTND, bsx4 active, tolerating PO's Extremities: warm/dry, no edema, no rashes / lesions   Filed Vitals:   10/01/14  1400 10/01/14 1528 10/01/14 2138 10/02/14 0608  BP:  126/99 128/94 120/83  Pulse: 95 106 88 76  Temp:  98.5 F (36.9 C) 98.9 F (37.2 C) 98.7 F (37.1 C)  TempSrc:  Oral Oral Oral  Resp: $Remo'15 16 18 20  'oySmD$ Height:  $Remove'5\' 7"'AdGLcGb$  (1.702 m)    Weight:  115 lb 1.3 oz (52.2 kg)    SpO2: 100% 100% 100% 100%     Discharge Labs  BMET  Recent Labs Lab 09/30/14 1125 09/30/14 1617 10/01/14 0030 10/01/14 0550 10/02/14 0416  NA 137 138 136 137 135  K 3.9 2.9* 3.0* 3.5 2.9*  CL 115* 113* 113* 111 103  CO2 8* 12* 14* 15* 21*  GLUCOSE 168* 140* 125* 191* 264*  BUN 8 7 5* <5* <5*  CREATININE 1.05 0.95 0.78 0.76 0.60*  CALCIUM 7.1* 7.2* 7.3* 7.8* 8.2*  MG  --   --   --  1.7  --   PHOS  --   --   --  1.1*  --    CBC  Recent Labs Lab 09/30/14 0855 10/01/14 0554 10/02/14 0416  HGB 13.8 13.5 13.9  HCT 39.1 37.7* 37.7*  WBC 12.5* 3.8* 3.7*  PLT 207 176 154      Medication List     STOP taking these medications        insulin aspart 100 UNIT/ML injection  Commonly known as:  novoLOG      TAKE these medications        atorvastatin 20 MG tablet  Commonly known as:  LIPITOR  Take 1 tablet (20 mg total) by mouth daily.     insulin aspart protamine- aspart (70-30) 100 UNIT/ML injection  Commonly known as:  NOVOLOG MIX 70/30  Inject 0.28 mLs (28 Units total) into the skin 2 (two) times daily with a meal.     NEEDLE (DISP) 22 G 22G X 1-1/2" Misc  Commonly known as:  BD DISP NEEDLES  5 Units by Does not apply route 4 (four) times daily -  with meals and at bedtime.     potassium chloride 10 MEQ tablet  Commonly known as:  K-DUR  Take 1 tablet (10 mEq total) by mouth daily.     Syringe (Disposable) 1 ML Misc  5 Units by Does not apply route 4 (four) times daily -  with meals and at bedtime.     TRUEPLUS LANCETS 28G Misc  1 each by Does not apply route 3 (three) times daily.        Follow-up Information    Follow up with Minerva Ends, MD On 10/09/2014.   Specialty:  Family Medicine   Why:  Appt at 3:30 PM with BMP (lab to check potassium level)   Contact information:   [address] [place] [address] [phone]        Disposition:  Home.    Discharged Condition: [name] [name] has met maximum benefit of inpatient care and is medically stable and cleared for discharge.  Patient is pending follow up as above.      Time spent on disposition:  Greater than 35 minutes.   Signed: Noe Gens, NP-C [facility] Pgr: [phone] Office: [phone]

## 2014-10-02 NOTE — Progress Notes (Signed)
Inpatient Diabetes Program Recommendations  AACE/ADA: New Consensus Statement on Inpatient Glycemic Control (2013)  Target Ranges:  Prepandial:   less than 140 mg/dL      Peak postprandial:   less than 180 mg/dL (1-2 hours)      Critically ill patients:  140 - 180 mg/dL     Request for assistance with home insulin regimen. According to his weight (which may be low due to continuous  hyperglycemia (HgbA1C of 12.4% in March of 2016.) his total daily insulin needs would total of 50 units insulin per day( Novolog 70/30: 25 u am and 25 u ac supper). However pt stated to have been taking 30 units 70/30 bid. Spoke with Flint HillBrandy, [place] and recommended 28 units Novolog 70/30 bid as an estimate between 30 u bid and wt based calculation of 25 units bid  Thank you Lenor CoffinAnn Winefred Hillesheim, RN, MSN, CDE  Diabetes Inpatient Program Office: [phone] Pager: [phone] 8:00 am to 5:00 pm

## 2014-10-02 NOTE — Progress Notes (Signed)
Patient discharge teaching given, including activity, diet, follow-up appoints, and medications. Patient verbalized understanding of all discharge instructions. IV access was d/c'd. Vitals are stable. Skin is intact except as charted in most recent assessments. Pt to be escorted out by NT, to be driven home by family. 

## 2014-10-09 ENCOUNTER — Encounter: Payer: Self-pay | Admitting: Family Medicine

## 2014-10-09 ENCOUNTER — Ambulatory Visit: Payer: Self-pay | Attending: Family Medicine | Admitting: Family Medicine

## 2014-10-09 VITALS — BP 116/80 | HR 102 | Temp 99.6°F | Resp 16 | Ht 66.0 in | Wt 123.0 lb

## 2014-10-09 DIAGNOSIS — Z794 Long term (current) use of insulin: Secondary | ICD-10-CM | POA: Insufficient documentation

## 2014-10-09 DIAGNOSIS — E1010 Type 1 diabetes mellitus with ketoacidosis without coma: Secondary | ICD-10-CM | POA: Insufficient documentation

## 2014-10-09 DIAGNOSIS — IMO0002 Reserved for concepts with insufficient information to code with codable children: Secondary | ICD-10-CM

## 2014-10-09 DIAGNOSIS — E1065 Type 1 diabetes mellitus with hyperglycemia: Secondary | ICD-10-CM | POA: Insufficient documentation

## 2014-10-09 LAB — GLUCOSE, POCT (MANUAL RESULT ENTRY)
POC Glucose: 345 mg/dl — AB (ref 70–99)
POC Glucose: 250 mg/dl — AB (ref 70–99)

## 2014-10-09 LAB — POCT URINALYSIS DIPSTICK
Blood, UA: NEGATIVE
Glucose, UA: 500
Glucose, UA: 500
KETONES UA: 80
Ketones, UA: >=160
LEUKOCYTES UA: NEGATIVE
Leukocytes, UA: NEGATIVE
Nitrite, UA: NEGATIVE
Nitrite, UA: NEGATIVE
PH UA: 5.5
PROTEIN UA: 30
RBC UA: NEGATIVE
Spec Grav, UA: 1.025
Spec Grav, UA: >=1.03
Urobilinogen, UA: 0.2
Urobilinogen, UA: 0.2
pH, UA: 5.5

## 2014-10-09 MED ORDER — INSULIN ASPART PROT & ASPART (70-30 MIX) 100 UNIT/ML ~~LOC~~ SUSP: 35.0000 [IU] | Freq: Two times a day (BID) | SUBCUTANEOUS | Status: DC

## 2014-10-09 MED ORDER — "INSULIN SYRINGE-NEEDLE U-100 31G X 5/16"" 0.5 ML MISC": 1.0000 | Freq: Two times a day (BID) | Status: DC

## 2014-10-09 MED ORDER — INSULIN ASPART 100 UNIT/ML ~~LOC~~ SOLN
10.0000 [IU] | Freq: Once | SUBCUTANEOUS | Status: AC
  Administered 2014-10-09: 10 [IU] via SUBCUTANEOUS

## 2014-10-09 NOTE — Assessment & Plan Note (Signed)
A; patient with ketones 80 > 160. No evidence of  Metabolic acidosis on exam. Treated in office with 10 U of novolog and  P: CMP done  Will review and call patient if bicarb low necessitating return to ED 70/30 increased to 35 U BID

## 2014-10-09 NOTE — Progress Notes (Signed)
Ed F/U DKA Glucose elevated today, pt stated had a hamberger early at 2:30pm Insulin at 1030 28 unit

## 2014-10-09 NOTE — Patient Instructions (Addendum)
Mr. [name] [name],  1. Diabetes type 1 currently uncontrolled:  You do have ketones in you urine still Blood sugar came down with insulin  Increase 70/30 35 U twice daily  Referrals: Diabetes education Opthalmology Endocrinology   Diabetes blood sugar goals  Fasting (in AM before breakfast, 8 hrs of no eating or drinking (except water or unsweetened coffee or tea): 90-110 2 hrs after meals: < 160,   No low sugars: nothing < 70   You will be called with lab results and instructions to go to the ED if labs are abnormal. Also go to ED if you develop drowsiness or shortness of breath.   F/u in 2 weeks with log with RN for CBG check and log review  F/u with me in 6 weeks for diabetes   Dr. Armen PickupFunches

## 2014-10-09 NOTE — Assessment & Plan Note (Signed)
Diabetes type 1 currently uncontrolled:  Increase 70/30 35 U twice daily  Referrals: Diabetes education Opthalmology Endocrinology   Diabetes blood sugar goals  Fasting (in AM before breakfast, 8 hrs of no eating or drinking (except water or unsweetened coffee or tea): 90-110 2 hrs after meals: < 160,   No low sugars: nothing < 70

## 2014-10-09 NOTE — Progress Notes (Signed)
   Subjective:    Patient ID: [name] [name], male    DOB: May 02, 1986, 29 y.o.   MRN: [id number] CC: ED f/u DKA  HPI 29 yo M with IDDM type 1 with recurrent DKA   1. IDDM type 1: seen in ED on 09/30/14 admitted for 2 days with DKA. Treated with insulin and IVF. Taking 70/20 28 U BID. He denies missing doses. Reports increase thirst in urination. Denies fever, chills, CP, SOB, dysuria, rash, skin lesions, oral lesion. Does not drink ETOH. Admits to eating high cab snack of chips and crackers.   Soc Hx: former smoker, quit 09/19/2013   Review of Systems  Constitutional: Negative.   HENT: Negative.   Respiratory: Negative.   Cardiovascular: Negative.   Gastrointestinal: Negative.   Endocrine: Positive for polydipsia, polyphagia and polyuria. Negative for cold intolerance and heat intolerance.       Objective:   Physical Exam BP 116/80 mmHg  Pulse 102  Temp(Src) 99.6 F (37.6 C) (Oral)  Resp 16  Ht 5\' 6"  (1.676 m)  Wt 123 lb (55.792 kg)  BMI 19.86 kg/m2  SpO2 98%  Wt Readings from Last 3 Encounters:  10/09/14 123 lb (55.792 kg)  10/01/14 115 lb 1.3 oz (52.2 kg)  09/02/14 121 lb 14.4 oz (55.293 kg)   General appearance: alert, cooperative and no distress Throat: lips, mucosa, and tongue normal; teeth and gums normal Lungs: clear to auscultation bilaterally Heart: regular rate and rhythm, S1, S2 normal, no murmur, click, rub or gallop Abdomen: soft, non-tender; bowel sounds normal; no masses,  no organomegaly Extremities: extremities normal, atraumatic, no cyanosis or edema Pulses: 2+ and symmetric Skin: Skin color, texture, turgor normal. No rashes or lesions  Feet: long nails.   Lab Results  Component Value Date   HGBA1C 12.4* 08/14/2014   CBG  345 UA: 500 glucose, 80 ketone,   Treated with 10 U novolog and IVF 750 cc NS   F/u CBG 250 F/u UA: 500 glucose, 160 ketones   Patient treated in office for 90 minutes     Assessment & Plan:

## 2014-10-10 LAB — COMPLETE METABOLIC PANEL WITH GFR
ALBUMIN: 4.3 g/dL (ref 3.5–5.2)
ALT: 13 U/L (ref 0–53)
AST: 15 U/L (ref 0–37)
Alkaline Phosphatase: 107 U/L (ref 39–117)
BUN: 16 mg/dL (ref 6–23)
CHLORIDE: 102 meq/L (ref 96–112)
CO2: 21 mEq/L (ref 19–32)
Calcium: 9 mg/dL (ref 8.4–10.5)
Creat: 0.85 mg/dL (ref 0.50–1.35)
GFR, Est Non African American: >89 mL/min
GLUCOSE: 248 mg/dL — AB (ref 70–99)
POTASSIUM: 4.5 meq/L (ref 3.5–5.3)
Sodium: 136 mEq/L (ref 135–145)
TOTAL PROTEIN: 6.9 g/dL (ref 6.0–8.3)
Total Bilirubin: 0.4 mg/dL (ref 0.2–1.2)

## 2014-10-15 ENCOUNTER — Ambulatory Visit: Payer: Self-pay | Attending: Internal Medicine

## 2014-10-21 ENCOUNTER — Telehealth: Payer: Self-pay | Admitting: *Deleted

## 2014-10-21 NOTE — Telephone Encounter (Signed)
Pt aware of results 

## 2014-10-21 NOTE — Telephone Encounter (Signed)
-----   Message from Dessa PhiJosalyn Funches, MD sent at 10/11/2014  2:58 PM EDT ----- Elevated glucose but normal potassium and low normal bicarb on CMP, no DKA

## 2014-10-23 ENCOUNTER — Encounter: Payer: Self-pay | Admitting: Family Medicine

## 2014-11-05 ENCOUNTER — Ambulatory Visit: Payer: Self-pay | Attending: Family Medicine | Admitting: Family Medicine

## 2014-11-05 ENCOUNTER — Encounter: Payer: Self-pay | Admitting: Family Medicine

## 2014-11-05 VITALS — BP 125/86 | HR 92 | Temp 99.1°F | Resp 16 | Ht 66.0 in

## 2014-11-05 DIAGNOSIS — E1065 Type 1 diabetes mellitus with hyperglycemia: Secondary | ICD-10-CM | POA: Insufficient documentation

## 2014-11-05 DIAGNOSIS — IMO0002 Reserved for concepts with insufficient information to code with codable children: Secondary | ICD-10-CM

## 2014-11-05 DIAGNOSIS — Z794 Long term (current) use of insulin: Secondary | ICD-10-CM | POA: Insufficient documentation

## 2014-11-05 DIAGNOSIS — Z87891 Personal history of nicotine dependence: Secondary | ICD-10-CM | POA: Insufficient documentation

## 2014-11-05 LAB — GLUCOSE, POCT (MANUAL RESULT ENTRY)
POC GLUCOSE: 253 mg/dL — AB (ref 70–99)
POC GLUCOSE: 158 mg/dL — AB (ref 70–99)

## 2014-11-05 LAB — POCT GLYCOSYLATED HEMOGLOBIN (HGB A1C): Hemoglobin A1C: 14.6

## 2014-11-05 MED ORDER — INSULIN ASPART 100 UNIT/ML ~~LOC~~ SOLN
10.0000 [IU] | Freq: Once | SUBCUTANEOUS | Status: AC
  Administered 2014-11-05: 10 [IU] via SUBCUTANEOUS

## 2014-11-05 MED ORDER — INSULIN ASPART PROT & ASPART (70-30 MIX) 100 UNIT/ML ~~LOC~~ SUSP: 40.0000 [IU] | Freq: Two times a day (BID) | SUBCUTANEOUS | Status: DC

## 2014-11-05 NOTE — Progress Notes (Signed)
   Subjective:    Patient ID: [name] [name], male    DOB: 07/30/1985, 29 y.o.   MRN: [id number] CC: f/u DM2 HPI  1. 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   Cardiovascular: Negative for chest pain, palpitations and leg swelling.  Gastrointestinal: Negative for nausea and vomiting.       Objective:   Physical Exam BP 125/86 mmHg  Pulse 92  Temp(Src) 99.1 F (37.3 C) (Oral)  Resp 16  Ht 5\' 6"  (1.676 m)  SpO2 100% General appearance: alert, cooperative and no distress Lungs: clear to auscultation bilaterally Heart: regular rate and rhythm, S1, S2 normal, no murmur, click, rub or gallop Extremities: extremities normal, atraumatic, no cyanosis or edema, long toenails   Lab Results  Component Value Date   HGBA1C 14.6 11/05/2014   CBG  253  Treated with 10 U novolog  Repeat CBG 158     Assessment & Plan:

## 2014-11-05 NOTE — Patient Instructions (Signed)
Mr. [name] [name],  Thank you for coming in today  1. IDDM type 1 A1c has gone up Increase 70/30 to 40 U twice daily  Continue healthy diet and sugar monitoring   Diabetes blood sugar goals  Fasting (in AM before breakfast, 8 hrs of no eating or drinking (except water or unsweetened coffee or tea): 90-110 2 hrs after meals: < 160,   No low sugars: nothing < 70   2. Long toenails:  Please schedule with podiatry clinic for nail clipping   F/u in 3 weeks with RN for CBG check  F/u with me in 3 months for IDDM type 1  Dr. Armen Pickup

## 2014-11-05 NOTE — Progress Notes (Signed)
F/U DM  Last Insulin at 830  Lunch at 1:00 pm

## 2014-11-05 NOTE — Assessment & Plan Note (Signed)
A: uncontrolled type 1 DM P:  Increase 70/30 to 40 U BID

## 2014-11-06 ENCOUNTER — Other Ambulatory Visit: Payer: Self-pay | Admitting: *Deleted

## 2014-11-06 DIAGNOSIS — IMO0002 Reserved for concepts with insufficient information to code with codable children: Secondary | ICD-10-CM

## 2014-11-06 DIAGNOSIS — E1065 Type 1 diabetes mellitus with hyperglycemia: Secondary | ICD-10-CM

## 2014-11-06 MED ORDER — INSULIN ASPART PROT & ASPART (70-30 MIX) 100 UNIT/ML ~~LOC~~ SUSP
40.0000 [IU] | Freq: Two times a day (BID) | SUBCUTANEOUS | Status: DC
Start: 2014-11-06 — End: 2015-03-24

## 2014-11-17 ENCOUNTER — Ambulatory Visit: Payer: Self-pay | Admitting: Family Medicine

## 2014-11-18 ENCOUNTER — Ambulatory Visit: Payer: Self-pay | Admitting: *Deleted

## 2014-11-19 ENCOUNTER — Ambulatory Visit: Payer: Self-pay | Admitting: Family Medicine

## 2014-12-17 ENCOUNTER — Encounter: Payer: Self-pay | Admitting: Family Medicine

## 2014-12-17 ENCOUNTER — Ambulatory Visit: Payer: Self-pay | Attending: Family Medicine | Admitting: Family Medicine

## 2014-12-17 VITALS — BP 113/78 | HR 92 | Temp 98.2°F | Resp 16 | Ht 66.0 in | Wt 119.0 lb

## 2014-12-17 DIAGNOSIS — E119 Type 2 diabetes mellitus without complications: Secondary | ICD-10-CM | POA: Insufficient documentation

## 2014-12-17 DIAGNOSIS — E1065 Type 1 diabetes mellitus with hyperglycemia: Secondary | ICD-10-CM

## 2014-12-17 DIAGNOSIS — IMO0002 Reserved for concepts with insufficient information to code with codable children: Secondary | ICD-10-CM

## 2014-12-17 DIAGNOSIS — Z87891 Personal history of nicotine dependence: Secondary | ICD-10-CM | POA: Insufficient documentation

## 2014-12-17 DIAGNOSIS — B353 Tinea pedis: Secondary | ICD-10-CM | POA: Insufficient documentation

## 2014-12-17 LAB — POCT URINALYSIS DIPSTICK
Bilirubin, UA: NEGATIVE
Blood, UA: NEGATIVE
Glucose, UA: 500
Ketones, UA: >=160
Leukocytes, UA: NEGATIVE
NITRITE UA: NEGATIVE
PH UA: 5
Spec Grav, UA: 1.01
UROBILINOGEN UA: 0.2

## 2014-12-17 LAB — GLUCOSE, POCT (MANUAL RESULT ENTRY)
POC GLUCOSE: 236 mg/dL — AB (ref 70–99)
POC Glucose: 500 mg/dl — AB (ref 70–99)

## 2014-12-17 MED ORDER — INSULIN GLARGINE 100 UNIT/ML ~~LOC~~ SOLN: 15.0000 [IU] | Freq: Every day | SUBCUTANEOUS | Status: DC

## 2014-12-17 MED ORDER — TERBINAFINE HCL 1 % EX CREA: 1.0000 "application " | TOPICAL_CREAM | Freq: Two times a day (BID) | CUTANEOUS | Status: DC

## 2014-12-17 MED ORDER — INSULIN ASPART 100 UNIT/ML ~~LOC~~ SOLN
20.0000 [IU] | Freq: Once | SUBCUTANEOUS | Status: AC
  Administered 2014-12-17: 20 [IU] via SUBCUTANEOUS

## 2014-12-17 NOTE — Progress Notes (Signed)
   Subjective:    Patient ID: [name] [name], male    DOB: April 10, 1986, 29 y.o.   MRN: [id number] CC: f/u IDDM type 1 HPI 29 yo M presents for follow up   CHRONIC DIABETES  Disease Monitoring  Blood Sugar Ranges:  Average is over 250. Higher sugars in the afternoon.   Polyuria: no   Visual problems: no   Medication Compliance: yes takes 30-35 U BID  Medication Side Effects  Hypoglycemia: yes. 56 early in the morning   Preventitive Health Care  Eye Exam: due   Foot Exam: done   Diet pattern: eats breakfast and dinner. Works during lunch   Exercise: minimal    History  Substance Use Topics  . Smoking status: Former Smoker    Quit date: 09/19/2013  . Smokeless tobacco: Never Used  . Alcohol Use: No    Review of Systems  Constitutional: Negative for fever, chills, fatigue and unexpected weight change.  Eyes: Negative for visual disturbance.  Respiratory: Negative for cough and shortness of breath.   Cardiovascular: Negative for chest pain, palpitations and leg swelling.  Gastrointestinal: Negative for nausea, vomiting, abdominal pain, diarrhea, constipation and blood in stool.  Endocrine: Positive for polydipsia.  Musculoskeletal: Negative for myalgias, back pain, arthralgias, gait problem and neck pain.  Skin: Negative for rash.       Objective:   Physical Exam BP 113/78 mmHg  Pulse 92  Temp(Src) 98.2 F (36.8 C) (Oral)  Resp 16  Ht  (1.676 m)  Wt 119 lb (53.978 kg)  BMI 19.22 kg/m2  SpO2 98% General appearance: alert, cooperative and no distress Lungs: clear to auscultation bilaterally Heart: regular rate and rhythm, S1, S2 normal, no murmur, click, rub or gallop Abdomen: soft, non-tender; bowel sounds normal; no masses,  no organomegaly Extremities: extremities normal, atraumatic, no cyanosis or edema Skin: Toenails long and slightly yellow Brownish-black debris between toes   Lab Results  Component Value Date   HGBA1C 14.6 11/05/2014   CBG 500  down to 236 following 1 L NS and 20 U novolog   UA: ketones > 160      Assessment & Plan:

## 2014-12-17 NOTE — Progress Notes (Signed)
F/U DM Stated taking medication as prescribed Eating healthy since last visit  Took 30 unit of insulin at 10:00am

## 2014-12-17 NOTE — Assessment & Plan Note (Signed)
Foot fungus: Lamisil cream ordered apply between toes

## 2014-12-17 NOTE — Patient Instructions (Addendum)
[name] [name],  Thank you for coming in today  1. Diabetes type 2 with high sugars   Continue novolofg 70/30, 30 U twice daily Start lantus 15 U in the morning tomorrow, write down dose  Increase lantus by 1 daily U for every fasting sugar > 110 up to 30 U of lantus  Remember to write down every fasting sugar and every pre dinner sugar   Diabetes blood sugar goals  Fasting (in AM before breakfast, 8 hrs of no eating or drinking (except water or unsweetened coffee or tea): 90-110 2 hrs after meals: < 160,   No low sugars: nothing < 70   2. Foot fungus: Lamisil cream ordered apply between toes   Please apply for [facility] discount and orange card, you can also inquire if any of your medications are on the PASS (medications assistance) list.  Orange card cover referrals that are planned: 1. Podiatry 2. Opthalmology   F/u with nurse in 4 weeks for CBG check and flu shot F/u with me in 2 months   Dr. Armen Pickup   Potassium Content of Foods Potassium is a mineral found in many foods and drinks. It helps keep fluids and minerals balanced in your body and affects how steadily your heart beats. Potassium also helps control your blood pressure and keep your muscles and nervous system healthy. Certain health conditions and medicines may change the balance of potassium in your body. When this happens, you can help balance your level of potassium through the foods that you do or do not eat. Your health care provider or dietitian may recommend an amount of potassium that you should have each day. The following lists of foods provide the amount of potassium (in parentheses) per serving in each item. HIGH IN POTASSIUM  The following foods and beverages have 200 mg or more of potassium per serving:  Apricots, 2 raw or 5 dry (200 mg).  Artichoke, 1 medium (345 mg).  Avocado, raw,  each (245 mg).  Banana, 1 medium (425 mg).  Beans, lima, or baked beans, canned,  cup (280 mg).  Beans, white,  canned,  cup (595 mg).  Beef roast, 3 oz (320 mg).  Beef, ground, 3 oz (270 mg).  Beets, raw or cooked,  cup (260 mg).  Bran muffin, 2 oz (300 mg).  Broccoli,  cup (230 mg).  Brussels sprouts,  cup (250 mg).  Cantaloupe,  cup (215 mg).  Cereal, 100% bran,  cup (200-400 mg).  Cheeseburger, single, fast food, 1 each (225-400 mg).  Chicken, 3 oz (220 mg).  Clams, canned, 3 oz (535 mg).  Crab, 3 oz (225 mg).  Dates, 5 each (270 mg).  Dried beans and peas,  cup (300-475 mg).  Figs, dried, 2 each (260 mg).  Fish: halibut, tuna, cod, snapper, 3 oz (480 mg).  Fish: salmon, haddock, swordfish, perch, 3 oz (300 mg).  Fish, tuna, canned 3 oz (200 mg).  [place] fries, fast food, 3 oz (470 mg).  Granola with fruit and nuts,  cup (200 mg).  Grapefruit juice,  cup (200 mg).  Greens, beet,  cup (655 mg).  Honeydew melon,  cup (200 mg).  Kale, raw, 1 cup (300 mg).  Kiwi, 1 medium (240 mg).  Kohlrabi, rutabaga, parsnips,  cup (280 mg).  Lentils,  cup (365 mg).  Mango, 1 each (325 mg).  Milk, chocolate, 1 cup (420 mg).  Milk: nonfat, low-fat, whole, buttermilk, 1 cup (350-380 mg).  Molasses, 1 Tbsp (295 mg).  Mushrooms,  cup (280) mg.  Nectarine, 1 each (275 mg).  Nuts: almonds, peanuts, hazelnuts, [place], cashew, mixed, 1 oz (200 mg).  Nuts, pistachios, 1 oz (295 mg).  Orange, 1 each (240 mg).  Orange juice,  cup (235 mg).  Papaya, medium,  fruit (390 mg).  Peanut butter, chunky, 2 Tbsp (240 mg).  Peanut butter, smooth, 2 Tbsp (210 mg).  Pear, 1 medium (200 mg).  Pomegranate, 1 whole (400 mg).  Pomegranate juice,  cup (215 mg).  Pork, 3 oz (350 mg).  Potato chips, salted, 1 oz (465 mg).  Potato, baked with skin, 1 medium (925 mg).  Potatoes, boiled,  cup (255 mg).  Potatoes, mashed,  cup (330 mg).  Prune juice,  cup (370 mg).  Prunes, 5 each (305 mg).  Pudding, chocolate,  cup (230 mg).  Pumpkin, canned,  cup  (250 mg).  Raisins, seedless,  cup (270 mg).  Seeds, sunflower or pumpkin, 1 oz (240 mg).  Soy milk, 1 cup (300 mg).  Spinach,  cup (420 mg).  Spinach, canned,  cup (370 mg).  Sweet potato, baked with skin, 1 medium (450 mg).  Swiss chard,  cup (480 mg).  Tomato or vegetable juice,  cup (275 mg).  Tomato sauce or puree,  cup (400-550 mg).  Tomato, raw, 1 medium (290 mg).  Tomatoes, canned,  cup (200-300 mg).  [place], 3 oz (250 mg).  Wheat germ, 1 oz (250 mg).  Winter squash,  cup (250 mg).  Yogurt, plain or fruited, 6 oz (260-435 mg).  Zucchini,  cup (220 mg). MODERATE IN POTASSIUM The following foods and beverages have 50-200 mg of potassium per serving:  Apple, 1 each (150 mg).  Apple juice,  cup (150 mg).  Applesauce,  cup (90 mg).  Apricot nectar,  cup (140 mg).  Asparagus, small spears,  cup or 6 spears (155 mg).  Bagel, cinnamon raisin, 1 each (130 mg).  Bagel, egg or plain, 4 in., 1 each (70 mg).  Beans, green,  cup (90 mg).  Beans, yellow,  cup (190 mg).  Beer, regular, 12 oz (100 mg).  Beets, canned,  cup (125 mg).  Blackberries,  cup (115 mg).  Blueberries,  cup (60 mg).  Bread, whole wheat, 1 slice (70 mg).  Broccoli, raw,  cup (145 mg).  Cabbage,  cup (150 mg).  Carrots, cooked or raw,  cup (180 mg).  Cauliflower, raw,  cup (150 mg).  Celery, raw,  cup (155 mg).  Cereal, bran flakes, cup (120-150 mg).  Cheese, cottage,  cup (110 mg).  Cherries, 10 each (150 mg).  Chocolate, 1 oz bar (165 mg).  Coffee, brewed 6 oz (90 mg).  Corn,  cup or 1 ear (195 mg).  Cucumbers,  cup (80 mg).  Egg, large, 1 each (60 mg).  Eggplant,  cup (60 mg).  Endive, raw, cup (80 mg).  English muffin, 1 each (65 mg).  Fish, orange roughy, 3 oz (150 mg).  Frankfurter, beef or pork, 1 each (75 mg).  Fruit cocktail,  cup (115 mg).  Grape juice,  cup (170 mg).  Grapefruit,  fruit (175 mg).  Grapes,   cup (155 mg).  Greens: kale, turnip, collard,  cup (110-150 mg).  Ice cream or frozen yogurt, chocolate,  cup (175 mg).  Ice cream or frozen yogurt, vanilla,  cup (120-150 mg).  Lemons, limes, 1 each (80 mg).  Lettuce, all types, 1 cup (100 mg).  Mixed vegetables,  cup (150 mg).  Mushrooms, raw,  cup (110 mg).  Nuts: walnuts, pecans, or macadamia, 1 oz (125 mg).  Oatmeal,  cup (80 mg).  Okra,  cup (110 mg).  Onions, raw,  cup (120 mg).  Peach, 1 each (185 mg).  Peaches, canned,  cup (120 mg).  Pears, canned,  cup (120 mg).  Peas, green, frozen,  cup (90 mg).  Peppers, green,  cup (130 mg).  Peppers, red,  cup (160 mg).  Pineapple juice,  cup (165 mg).  Pineapple, fresh or canned,  cup (100 mg).  Plums, 1 each (105 mg).  Pudding, vanilla,  cup (150 mg).  Raspberries,  cup (90 mg).  Rhubarb,  cup (115 mg).  Rice, wild,  cup (80 mg).  Shrimp, 3 oz (155 mg).  Spinach, raw, 1 cup (170 mg).  Strawberries,  cup (125 mg).  Summer squash  cup (175-200 mg).  Swiss chard, raw, 1 cup (135 mg).  Tangerines, 1 each (140 mg).  Tea, brewed, 6 oz (65 mg).  Turnips,  cup (140 mg).  Watermelon,  cup (85 mg).  Wine, red, table, 5 oz (180 mg).  Wine, white, table, 5 oz (100 mg). LOW IN POTASSIUM The following foods and beverages have less than 50 mg of potassium per serving.  Bread, white, 1 slice (30 mg).  Carbonated beverages, 12 oz (less than 5 mg).  Cheese, 1 oz (20-30 mg).  Cranberries,  cup (45 mg).  Cranberry juice cocktail,  cup (20 mg).  Fats and oils, 1 Tbsp (less than 5 mg).  Hummus, 1 Tbsp (32 mg).  Nectar: papaya, mango, or pear,  cup (35 mg).  Rice, white or brown,  cup (50 mg).  Spaghetti or macaroni,  cup cooked (30 mg).  Tortilla, flour or corn, 1 each (50 mg).  Waffle, 4 in., 1 each (50 mg).  Water chestnuts,  cup (40 mg). Document Released: 12/20/2004 Document Revised: 05/13/2013 Document  Reviewed: 04/04/2013 [facility] Patient Information 2015 [place], [place]. This information is not intended to replace advice given to you by your health care provider. Make sure you discuss any questions you have with your health care provider.

## 2014-12-17 NOTE — Assessment & Plan Note (Signed)
Diabetes type 2 with high sugars   Continue novolofg 70/30, 30 U twice daily Start lantus 15 U in the morning tomorrow, write down dose  Increase lantus by 1 daily U for every fasting sugar > 110 up to 30 U of lantus  Remember to write down every fasting sugar and every pre dinner sugar   Diabetes blood sugar goals  Fasting (in AM before breakfast, 8 hrs of no eating or drinking (except water or unsweetened coffee or tea): 90-110 2 hrs after meals: < 160,   No low sugars: nothing < 70

## 2014-12-25 ENCOUNTER — Emergency Department (HOSPITAL_COMMUNITY)
Admission: EM | Admit: 2014-12-25 | Discharge: 2014-12-25 | Disposition: A | Discharge status: Home / Self Care | Payer: Self-pay | Attending: Emergency Medicine | Admitting: Emergency Medicine

## 2014-12-25 ENCOUNTER — Encounter (HOSPITAL_COMMUNITY): Payer: Self-pay | Admitting: *Deleted

## 2014-12-25 DIAGNOSIS — Z794 Long term (current) use of insulin: Secondary | ICD-10-CM | POA: Insufficient documentation

## 2014-12-25 DIAGNOSIS — Z79899 Other long term (current) drug therapy: Secondary | ICD-10-CM | POA: Insufficient documentation

## 2014-12-25 DIAGNOSIS — H109 Unspecified conjunctivitis: Secondary | ICD-10-CM | POA: Insufficient documentation

## 2014-12-25 DIAGNOSIS — Z87448 Personal history of other diseases of urinary system: Secondary | ICD-10-CM | POA: Insufficient documentation

## 2014-12-25 DIAGNOSIS — Z87891 Personal history of nicotine dependence: Secondary | ICD-10-CM | POA: Insufficient documentation

## 2014-12-25 DIAGNOSIS — E119 Type 2 diabetes mellitus without complications: Secondary | ICD-10-CM | POA: Insufficient documentation

## 2014-12-25 MED ORDER — TETRACAINE HCL 0.5 % OP SOLN
2.0000 [drp] | Freq: Once | OPHTHALMIC | Status: AC
  Administered 2014-12-25: 2 [drp] via OPHTHALMIC
  Filled 2014-12-25: qty 2

## 2014-12-25 MED ORDER — NEOMYCIN-POLYMYXIN-DEXAMETH 3.5-10000-0.1 OP OINT
TOPICAL_OINTMENT | Freq: Four times a day (QID) | OPHTHALMIC | Status: DC
  Administered 2014-12-25: 11:00:00 via OPHTHALMIC
  Filled 2014-12-25: qty 3.5

## 2014-12-25 MED ORDER — FLUORESCEIN SODIUM 1 MG OP STRP
1.0000 | ORAL_STRIP | Freq: Once | OPHTHALMIC | Status: DC
  Filled 2014-12-25: qty 1

## 2014-12-25 NOTE — ED Provider Notes (Signed)
CSN: [id number]     Arrival date & time 12/25/14  0846 History  This chart was scribed for non-physician practitioner Jaynie Crumble, PA-C working with Melene Plan, DO by Littie Deeds, ED Scribe. This patient was seen in room TR04C/TR04C and the patient's care was started at 9:32 AM.       Chief Complaint  Patient presents with  . Eye Problem   The history is provided by the patient. No language interpreter was used.   HPI Comments: [name] [name] is a 29 y.o. male who presents to the Emergency Department complaining of gradual onset right eye irritation that started yesterday after waking up from a nap. Patient reports having associated photophobia, eye redness, and watery eye discharge. He feels there may be a foreign body in his eye, but he is unsure. He has had difficulty keeping his eye open due to his symptoms. He did try some visine eye drops yesterday which did provide some relief, but he started having the irritation again when he laid down to sleep. Patient denies eye itchiness and visual changes. He has been able to see without any problems. He also denies using contacts or glasses.   Past Medical History  Diagnosis Date  . Diabetes mellitus without complication Dx 2012  . Renal insufficiency 2011    pt was on dialysis for a short time approx 2011, pt is no longer on dialysis   History reviewed. No pertinent past surgical history. Family History  Problem Relation Age of Onset  . Diabetes Father   . Diabetes Maternal Grandmother   . Heart disease Neg Hx   . Hypertension Neg Hx    History  Substance Use Topics  . Smoking status: Former Smoker    Quit date: 09/19/2013  . Smokeless tobacco: Never Used  . Alcohol Use: No    Review of Systems  Constitutional: Negative for fever and chills.  Eyes: Positive for photophobia, pain, discharge and redness. Negative for itching and visual disturbance.  Neurological: Negative for headaches.      Allergies  Review of patient's  allergies indicates no known allergies.  Home Medications   Prior to Admission medications   Medication Sig Start Date End Date Taking? Authorizing Provider  atorvastatin (LIPITOR) 20 MG tablet Take 1 tablet (20 mg total) by mouth daily. 09/03/14   Albertine Grates, MD  insulin aspart protamine- aspart (NOVOLOG MIX 70/30) (70-30) 100 UNIT/ML injection Inject 0.4 mLs (40 Units total) into the skin 2 (two) times daily with a meal. 11/06/14   Josalyn Funches, MD  insulin glargine (LANTUS) 100 UNIT/ML injection Inject 0.15 mLs (15 Units total) into the skin at bedtime. 12/17/14   Josalyn Funches, MD  Insulin Syringe-Needle U-100 (B-D INS SYRINGE 0.5CC/31GX5/16) 31G X 5/16" 0.5 ML MISC 1 each by Does not apply route 2 (two) times daily. 10/09/14   Josalyn Funches, MD  potassium chloride (K-DUR) 10 MEQ tablet Take 1 tablet (10 mEq total) by mouth daily. 10/02/14   Jeanella Craze, NP  terbinafine (LAMISIL AT) 1 % cream Apply 1 application topically 2 (two) times daily. 12/17/14   Dessa Phi, MD  TRUEPLUS LANCETS 28G MISC 1 each by Does not apply route 3 (three) times daily. 05/04/14   Josalyn Funches, MD   BP 120/73 mmHg  Pulse 106  Temp(Src) 98.7 F (37.1 C) (Oral)  Resp 18  SpO2 99% Physical Exam  Constitutional: He is oriented to person, place, and time. He appears well-developed and well-nourished. No distress.  HENT:  Head: Normocephalic and atraumatic.  Mouth/Throat: Oropharynx is clear and moist. No oropharyngeal exudate.  Eyes: Pupils are equal, round, and reactive to light. Right eye exhibits no discharge, no exudate and no hordeolum. No foreign body present in the right eye. Right conjunctiva is injected. Right conjunctiva has no hemorrhage.  Slit lamp exam:      The right eye shows no corneal abrasion, no corneal flare, no corneal ulcer, no foreign body, no hyphema and no hypopyon.  Injected right conjunctiva. No corneal abrasions.   Neck: Neck supple.  Cardiovascular: Normal rate.    Pulmonary/Chest: Effort normal.  Musculoskeletal: He exhibits no edema.  Neurological: He is alert and oriented to person, place, and time. No cranial nerve deficit.  Skin: Skin is warm and dry. No rash noted.  Psychiatric: He has a normal mood and affect. His behavior is normal.  Nursing note and vitals reviewed.   ED Course  Procedures  DIAGNOSTIC STUDIES: Oxygen Saturation is 99% on room air, normal by my interpretation.    COORDINATION OF CARE: 9:35 AM-Discussed treatment plan which includes medications with patient/guardian at bedside and patient/guardian agreed to plan.    Labs Review Labs Reviewed - No data to display  Imaging Review No results found.   EKG Interpretation None      MDM   Final diagnoses:  Conjunctivitis of right eye     patient with right eye redness, pain, tearfulness, photophobia. Patient is in no distress. Conjunctiva is injected on exam. No corneal abrasions or ulcerations. Visual acuity is normal. Patient states pain is minimal and feels like discomfort for more than pain. Most likely conjunctivitis, will treat with antibiotics ointment. Follow-up with primary care doctor or ophthalmology. Referral provided. At this time no visual changes, no significant pain, no significant exam finding.  Filed Vitals:   12/25/14 0852  BP: 120/73  Pulse: 106  Temp: 98.7 F (37.1 C)  TempSrc: Oral  Resp: 18  SpO2: 99%   I personally performed the services described in this documentation, which was scribed in my presence. The recorded information has been reviewed and is accurate.   Jaynie Crumble, PA-C 12/25/14 1008  Melene Plan, DO 12/25/14 [phone]

## 2014-12-25 NOTE — ED Notes (Signed)
Pt reports onset yesterday of right eye pain and redness, sensitivity to light.

## 2014-12-25 NOTE — Discharge Instructions (Signed)
Apply ointment 4 times a day. Follow up with ophthalmology if symptoms continue by Monday.    Conjunctivitis Conjunctivitis is commonly called "pink eye." Conjunctivitis can be caused by bacterial or viral infection, allergies, or injuries. There is usually redness of the lining of the eye, itching, discomfort, and sometimes discharge. There may be deposits of matter along the eyelids. A viral infection usually causes a watery discharge, while a bacterial infection causes a yellowish, thick discharge. Pink eye is very contagious and spreads by direct contact. You may be given antibiotic eyedrops as part of your treatment. Before using your eye medicine, remove all drainage from the eye by washing gently with warm water and cotton balls. Continue to use the medication until you have awakened 2 mornings in a row without discharge from the eye. Do not rub your eye. This increases the irritation and helps spread infection. Use separate towels from other household members. Wash your hands with soap and water before and after touching your eyes. Use cold compresses to reduce pain and sunglasses to relieve irritation from light. Do not wear contact lenses or wear eye makeup until the infection is gone. SEEK MEDICAL CARE IF:   Your symptoms are not better after 3 days of treatment.  You have increased pain or trouble seeing.  The outer eyelids become very red or swollen. Document Released: 06/15/2004 Document Revised: 07/31/2011 Document Reviewed: 05/08/2005 [facility] Patient Information 2015 [place], [place]. This information is not intended to replace advice given to you by your health care provider. Make sure you discuss any questions you have with your health care provider.

## 2015-02-09 ENCOUNTER — Ambulatory Visit: Payer: Self-pay | Attending: Internal Medicine | Admitting: Pharmacist

## 2015-02-09 DIAGNOSIS — E1065 Type 1 diabetes mellitus with hyperglycemia: Secondary | ICD-10-CM

## 2015-02-09 DIAGNOSIS — IMO0002 Reserved for concepts with insufficient information to code with codable children: Secondary | ICD-10-CM

## 2015-02-09 DIAGNOSIS — Z23 Encounter for immunization: Secondary | ICD-10-CM

## 2015-02-09 MED ORDER — INFLUENZA VAC SPLIT QUAD 0.5 ML IM SUSY
0.5000 mL | PREFILLED_SYRINGE | Freq: Once | INTRAMUSCULAR | Status: AC
  Administered 2015-02-09: 0.5 mL via INTRAMUSCULAR

## 2015-02-09 NOTE — Patient Instructions (Addendum)
Influenza Virus Vaccine injection (Fluarix) What is this medicine? INFLUENZA VIRUS VACCINE (in floo EN zuh VAHY ruhs vak SEEN) helps to reduce the risk of getting influenza also known as the flu. This medicine may be used for other purposes; ask your health care provider or pharmacist if you have questions. COMMON BRAND NAME(S): Fluarix, Fluzone What should I tell my health care provider before I take this medicine? They need to know if you have any of these conditions: -bleeding disorder like hemophilia -fever or infection -Guillain-Barre syndrome or other neurological problems -immune system problems -infection with the human immunodeficiency virus (HIV) or AIDS -low blood platelet counts -multiple sclerosis -an unusual or allergic reaction to influenza virus vaccine, eggs, chicken proteins, latex, gentamicin, other medicines, foods, dyes or preservatives -pregnant or trying to get pregnant -breast-feeding How should I use this medicine  This vaccine is for injection into a muscle. It is given by a health care professional. A copy of Vaccine Information Statements will be given before each vaccination. Read this sheet carefully each time. The sheet may change frequently. Talk to your pediatrician regarding the use of this medicine in children. Special care may be needed. Overdosage: If you think you have taken too much of this medicine contact a poison control center or emergency room at once. NOTE: This medicine is only for you. Do not share this medicine with others. What if I miss a dose? This does not apply. What may interact with this medicine? -chemotherapy or radiation therapy -medicines that lower your immune system like etanercept, anakinra, infliximab, and adalimumab -medicines that treat or prevent blood clots like warfarin -phenytoin -steroid medicines like prednisone or cortisone -theophylline -vaccines This list may not describe all possible interactions. Give your  health care provider a list of all the medicines, herbs, non-prescription drugs, or dietary supplements you use. Also tell them if you smoke, drink alcohol, or use illegal drugs. Some items may interact with your medicine. What should I watch for while using this medicine? Report any side effects that do not go away within 3 days to your doctor or health care professional. Call your health care provider if any unusual symptoms occur within 6 weeks of receiving this vaccine. You may still catch the flu, but the illness is not usually as bad. You cannot get the flu from the vaccine. The vaccine will not protect against colds or other illnesses that may cause fever. The vaccine is needed every year. What side effects may I notice from receiving this medicine? Side effects that you should report to your doctor or health care professional as soon as possible: -allergic reactions like skin rash, itching or hives, swelling of the face, lips, or tongue Side effects that usually do not require medical attention (report to your doctor or health care professional if they continue or are bothersome): -fever -headache -muscle aches and pains -pain, tenderness, redness, or swelling at site where injected -weak or tired This list may not describe all possible side effects. Call your doctor for medical advice about side effects. You may report side effects to FDA at 1-800-FDA-1088. Where should I keep my medicine? This vaccine is only given in a clinic, pharmacy, doctor's office, or other health care setting and will not be stored at home. NOTE: This sheet is a summary. It may not cover all possible information. If you have questions about this medicine, talk to your doctor, pharmacist, or health care provider.  2015, Elsevier/Gold Standard. (2007-12-04 09:30:40)   Start taking  40 units of 70/30 insulin twice daily. Keep a record of your blood sugar readings and bring to you next appointment.  Follow up in  the pharmacy clinic Juanita Craver) in 2 weeks

## 2015-02-09 NOTE — Progress Notes (Signed)
S:    Patient arrives in a pleasant mood. Presents for diabetes management. Patient reports having a long-standing history of T1DM.    Patient denies adherence with medications. Current diabetes medications include: Novolog 70/30 40 units twice daily and lantus 15 units daily. However, patient is not taking Lantus due to cost and confusion - he is doesn't think he is supposed to be taking it and is confused on how to take it.  Patient denies hypoglycemic events.  Patient reported dietary habits: Generally stays away from fried foods in favor of baked. Reports dinner is his largest meal. States he drinks many sugary drinks throughout the day.  Patient reported exercise habits: Low level of activity however, has been more active with his nephew playing football.   Patient denies nocturia. Patient denies neuropathy. Patient denies visual changes.    O:  . Lab Results  Component Value Date   HGBA1C 14.6 11/05/2014     Patient did not bring BG logs to visit but states FBG of 150-200 with values throughout the day ranging between 200-300.    A/P: Long-standing T1DM currently uncontrolled based on A1c of 14.6 and home readings. Denies hypoglycemic events and is able to verbalize appropriate hypoglycemia management plan. Denies adherence with medication. Control is suboptimal due to dietary indiscretion, poor medication adherence/understanding, and some difficulty affording medications. I think the main barrier is understanding and he will require continued education and close follow up as he appears to be very confused about diabetes and treatment.   He is currently prescribed Lantus 15 units daily and Novolog 70/30 40 units twice daily. Instructed patient to resume his prescribed regimen of 70/30 at 40 units twice daily. Will D/C lantus for now, but will likely need a basal/bolus regimen in the future. Will look into financial situation and refer for orange card, etc and then likely restart the  Lantus.  Also spent an extensive amount of time explaining the mechanism of insulin as well as proper diet strategies with patient. Discussed limiting carbohydrate intake to 30-45 carbs per meal and 15 carbs per snack as well as substituting sugary beverages with diet or calorie-free options. Provided patient with a log to track BG values over the next two weeks. Will reassess diabetes control after reviewing at next visit.  Next A1C anticipated at next visit.  Written patient instructions provided.  Follow up in Pharmacist Clinic Visit in 2 weeks. Total time in face to face counseling 40 minutes.  Patient seen with Sherle Poe, PharmD. resident.

## 2015-02-15 IMAGING — CR DG CHEST 1V PORT
1 series · 1 of 1 positions shown · non-contrast
Comparison: 04/24/2014

CLINICAL DATA: Hyperglycemia for 1 day. No chest complaints.
Nonsmoker. History of hypertension, diabetes, renal insufficiency.

EXAM:
PORTABLE CHEST - 1 VIEW

[AP]
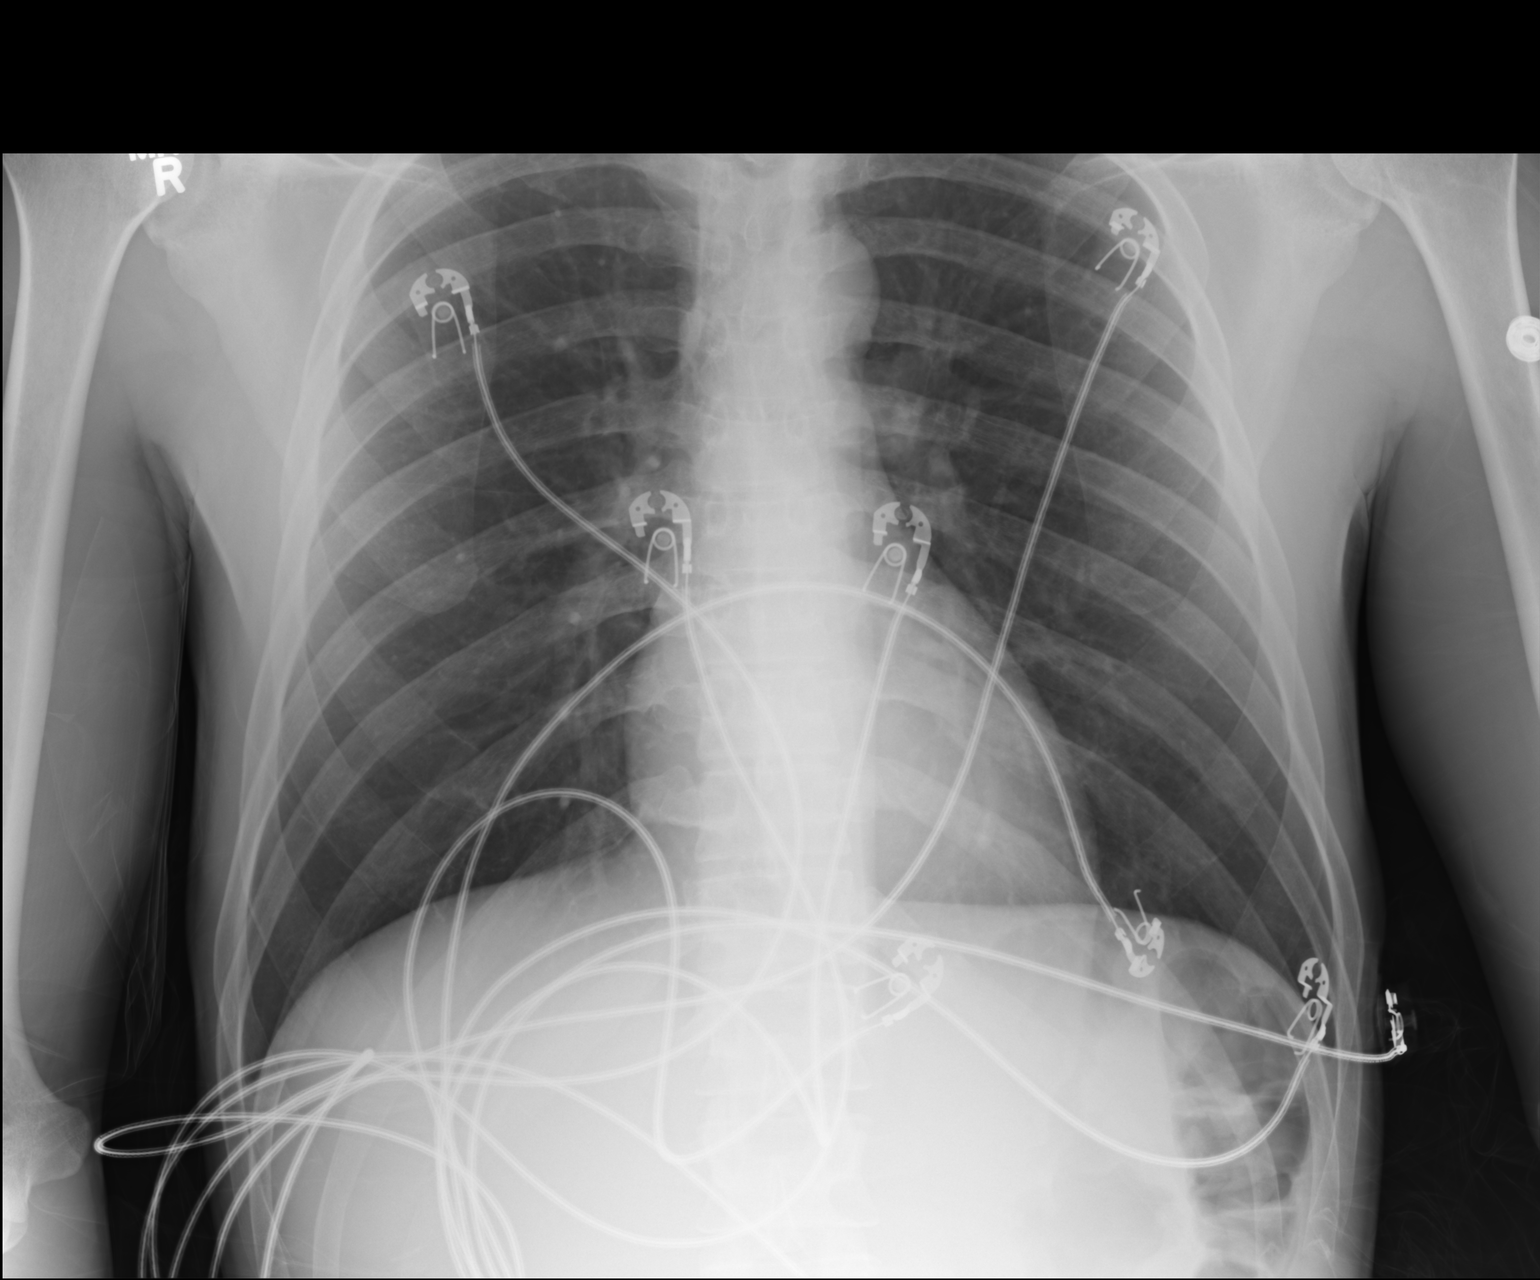

[1 of 1 positions shown; findings below may reference images not displayed]

FINDINGS: The heart size and mediastinal contours are within normal limits.
Both lungs are clear. The visualized skeletal structures are
unremarkable.
IMPRESSION: No active disease.

## 2015-03-22 ENCOUNTER — Encounter (HOSPITAL_COMMUNITY): Payer: Self-pay | Admitting: Emergency Medicine

## 2015-03-22 ENCOUNTER — Inpatient Hospital Stay (HOSPITAL_COMMUNITY)
Admission: EM | Admit: 2015-03-22 | Discharge: 2015-03-24 | DRG: 638 | Disposition: A | Discharge status: Home / Self Care | Payer: Self-pay | Attending: Infectious Diseases | Admitting: Infectious Diseases

## 2015-03-22 ENCOUNTER — Inpatient Hospital Stay (HOSPITAL_COMMUNITY): Payer: Self-pay

## 2015-03-22 DIAGNOSIS — E1010 Type 1 diabetes mellitus with ketoacidosis without coma: Principal | ICD-10-CM | POA: Diagnosis present

## 2015-03-22 DIAGNOSIS — Z794 Long term (current) use of insulin: Secondary | ICD-10-CM

## 2015-03-22 DIAGNOSIS — N179 Acute kidney failure, unspecified: Secondary | ICD-10-CM | POA: Diagnosis present

## 2015-03-22 DIAGNOSIS — E876 Hypokalemia: Secondary | ICD-10-CM | POA: Diagnosis present

## 2015-03-22 DIAGNOSIS — E785 Hyperlipidemia, unspecified: Secondary | ICD-10-CM | POA: Diagnosis present

## 2015-03-22 DIAGNOSIS — E1065 Type 1 diabetes mellitus with hyperglycemia: Secondary | ICD-10-CM

## 2015-03-22 DIAGNOSIS — Z87891 Personal history of nicotine dependence: Secondary | ICD-10-CM

## 2015-03-22 DIAGNOSIS — IMO0002 Reserved for concepts with insufficient information to code with codable children: Secondary | ICD-10-CM

## 2015-03-22 DIAGNOSIS — G629 Polyneuropathy, unspecified: Secondary | ICD-10-CM | POA: Diagnosis present

## 2015-03-22 LAB — I-STAT CHEM 8, ED
BUN: 9 mg/dL (ref 6–20)
CREATININE: 0.8 mg/dL (ref 0.61–1.24)
Calcium, Ion: 1.24 mmol/L — ABNORMAL HIGH (ref 1.12–1.23)
Chloride: 112 mmol/L — ABNORMAL HIGH (ref 101–111)
Glucose, Bld: 349 mg/dL — ABNORMAL HIGH (ref 65–99)
HEMATOCRIT: 51 % (ref 39.0–52.0)
Hemoglobin: 17.3 g/dL — ABNORMAL HIGH (ref 13.0–17.0)
POTASSIUM: 3.3 mmol/L — AB (ref 3.5–5.1)
Sodium: 137 mmol/L (ref 135–145)
TCO2: 6 mmol/L (ref 0–100)

## 2015-03-22 LAB — I-STAT VENOUS BLOOD GAS, ED
Acid-base deficit: 24 mmol/L — ABNORMAL HIGH (ref 0.0–2.0)
BICARBONATE: 5.8 meq/L — AB (ref 20.0–24.0)
O2 Saturation: 29 %
TCO2: 6 mmol/L (ref 0–100)
pCO2, Ven: 22.5 mmHg — ABNORMAL LOW (ref 45.0–50.0)
pH, Ven: 7.018 — CL (ref 7.250–7.300)
pO2, Ven: 27 mmHg — CL (ref 30.0–45.0)

## 2015-03-22 LAB — URINALYSIS, ROUTINE W REFLEX MICROSCOPIC
BILIRUBIN URINE: NEGATIVE
Glucose, UA: >1000 mg/dL — AB
Ketones, ur: >80 mg/dL — AB
Leukocytes, UA: NEGATIVE
NITRITE: NEGATIVE
PH: 5 (ref 5.0–8.0)
Protein, ur: 100 mg/dL — AB
Specific Gravity, Urine: 1.021 (ref 1.005–1.030)
Urobilinogen, UA: 0.2 mg/dL (ref 0.0–1.0)

## 2015-03-22 LAB — BASIC METABOLIC PANEL
ANION GAP: 17 — AB (ref 5–15)
ANION GAP: 15 (ref 5–15)
ANION GAP: 8 (ref 5–15)
BUN: 6 mg/dL (ref 6–20)
BUN: 11 mg/dL (ref 6–20)
BUN: 9 mg/dL (ref 6–20)
BUN: 10 mg/dL (ref 6–20)
CALCIUM: 8.8 mg/dL — AB (ref 8.9–10.3)
CALCIUM: 8.2 mg/dL — AB (ref 8.9–10.3)
CALCIUM: 8 mg/dL — AB (ref 8.9–10.3)
CHLORIDE: 115 mmol/L — AB (ref 101–111)
CO2: <7 mmol/L — ABNORMAL LOW (ref 22–32)
CO2: 7 mmol/L — AB (ref 22–32)
CO2: 10 mmol/L — AB (ref 22–32)
CO2: 14 mmol/L — AB (ref 22–32)
CREATININE: 0.92 mg/dL (ref 0.61–1.24)
Calcium: 8.6 mg/dL — ABNORMAL LOW (ref 8.9–10.3)
Chloride: 110 mmol/L (ref 101–111)
Chloride: 113 mmol/L — ABNORMAL HIGH (ref 101–111)
Chloride: 112 mmol/L — ABNORMAL HIGH (ref 101–111)
Creatinine, Ser: 1.33 mg/dL — ABNORMAL HIGH (ref 0.61–1.24)
Creatinine, Ser: 1.3 mg/dL — ABNORMAL HIGH (ref 0.61–1.24)
Creatinine, Ser: 1 mg/dL (ref 0.61–1.24)
GFR calc Af Amer: >60 mL/min (ref >60)
GFR calc non Af Amer: >60 mL/min (ref >60)
GFR calc non Af Amer: >60 mL/min (ref >60)
Glucose, Bld: 345 mg/dL — ABNORMAL HIGH (ref 65–99)
Glucose, Bld: 259 mg/dL — ABNORMAL HIGH (ref 65–99)
Glucose, Bld: 180 mg/dL — ABNORMAL HIGH (ref 65–99)
Glucose, Bld: 121 mg/dL — ABNORMAL HIGH (ref 65–99)
Potassium: 3.4 mmol/L — ABNORMAL LOW (ref 3.5–5.1)
Potassium: 3.1 mmol/L — ABNORMAL LOW (ref 3.5–5.1)
Potassium: 3.1 mmol/L — ABNORMAL LOW (ref 3.5–5.1)
Potassium: 3.4 mmol/L — ABNORMAL LOW (ref 3.5–5.1)
SODIUM: 134 mmol/L — AB (ref 135–145)
SODIUM: 139 mmol/L (ref 135–145)
SODIUM: 138 mmol/L (ref 135–145)
SODIUM: 134 mmol/L — AB (ref 135–145)

## 2015-03-22 LAB — URINE MICROSCOPIC-ADD ON

## 2015-03-22 LAB — LIPID PANEL
CHOLESTEROL: 332 mg/dL — AB (ref 0–200)
HDL: 42 mg/dL (ref >40)
LDL Cholesterol: UNDETERMINED mg/dL (ref 0–99)
Total CHOL/HDL Ratio: 7.9 RATIO
Triglycerides: 762 mg/dL — ABNORMAL HIGH (ref <150)
VLDL: UNDETERMINED mg/dL (ref 0–40)

## 2015-03-22 LAB — HEPATIC FUNCTION PANEL
ALBUMIN: 3.9 g/dL (ref 3.5–5.0)
ALT: 19 U/L (ref 17–63)
AST: 19 U/L (ref 15–41)
Alkaline Phosphatase: 82 U/L (ref 38–126)
BILIRUBIN TOTAL: 1.6 mg/dL — AB (ref 0.3–1.2)
Bilirubin, Direct: 0.1 mg/dL (ref 0.1–0.5)
Indirect Bilirubin: 1.5 mg/dL — ABNORMAL HIGH (ref 0.3–0.9)
TOTAL PROTEIN: 7.2 g/dL (ref 6.5–8.1)

## 2015-03-22 LAB — BETA-HYDROXYBUTYRIC ACID: Beta-Hydroxybutyric Acid: >8 mmol/L — ABNORMAL HIGH (ref 0.05–0.27)

## 2015-03-22 LAB — RAPID URINE DRUG SCREEN, HOSP PERFORMED
Amphetamines: NOT DETECTED
BARBITURATES: NOT DETECTED
BENZODIAZEPINES: NOT DETECTED
COCAINE: NOT DETECTED
OPIATES: NOT DETECTED
TETRAHYDROCANNABINOL: POSITIVE — AB

## 2015-03-22 LAB — PROTIME-INR
INR: 1.14 (ref 0.00–1.49)
PROTHROMBIN TIME: 14.8 s (ref 11.6–15.2)

## 2015-03-22 LAB — ETHANOL

## 2015-03-22 LAB — MAGNESIUM: MAGNESIUM: 1.9 mg/dL (ref 1.7–2.4)

## 2015-03-22 LAB — CBC
HCT: 44.1 % (ref 39.0–52.0)
Hemoglobin: 15.6 g/dL (ref 13.0–17.0)
MCH: 31.3 pg (ref 26.0–34.0)
MCHC: 35.4 g/dL (ref 30.0–36.0)
MCV: 88.4 fL (ref 78.0–100.0)
PLATELETS: 229 10*3/uL (ref 150–400)
RBC: 4.99 MIL/uL (ref 4.22–5.81)
RDW: 12.7 % (ref 11.5–15.5)
WBC: 7.5 10*3/uL (ref 4.0–10.5)

## 2015-03-22 LAB — GLUCOSE, CAPILLARY
GLUCOSE-CAPILLARY: 209 mg/dL — AB (ref 65–99)
GLUCOSE-CAPILLARY: 189 mg/dL — AB (ref 65–99)
GLUCOSE-CAPILLARY: 149 mg/dL — AB (ref 65–99)
GLUCOSE-CAPILLARY: 123 mg/dL — AB (ref 65–99)
GLUCOSE-CAPILLARY: 109 mg/dL — AB (ref 65–99)
GLUCOSE-CAPILLARY: 130 mg/dL — AB (ref 65–99)
Glucose-Capillary: 168 mg/dL — ABNORMAL HIGH (ref 65–99)
Glucose-Capillary: 130 mg/dL — ABNORMAL HIGH (ref 65–99)
Glucose-Capillary: 92 mg/dL (ref 65–99)

## 2015-03-22 LAB — PHOSPHORUS: PHOSPHORUS: 1.7 mg/dL — AB (ref 2.5–4.6)

## 2015-03-22 LAB — CBG MONITORING, ED
GLUCOSE-CAPILLARY: 205 mg/dL — AB (ref 65–99)
Glucose-Capillary: 345 mg/dL — ABNORMAL HIGH (ref 65–99)
Glucose-Capillary: 528 mg/dL — ABNORMAL HIGH (ref 65–99)
Glucose-Capillary: 329 mg/dL — ABNORMAL HIGH (ref 65–99)
Glucose-Capillary: 324 mg/dL — ABNORMAL HIGH (ref 65–99)

## 2015-03-22 LAB — MRSA PCR SCREENING: MRSA BY PCR: NEGATIVE

## 2015-03-22 MED ORDER — DEXTROSE-NACL 5-0.45 % IV SOLN: INTRAVENOUS | Status: DC

## 2015-03-22 MED ORDER — ONDANSETRON HCL 4 MG PO TABS: 4.0000 mg | ORAL_TABLET | Freq: Four times a day (QID) | ORAL | Status: DC | PRN

## 2015-03-22 MED ORDER — SODIUM CHLORIDE 0.9 % IV SOLN
INTRAVENOUS | Status: DC
  Administered 2015-03-22: 4.7 [IU]/h via INTRAVENOUS
  Filled 2015-03-22: qty 2.5

## 2015-03-22 MED ORDER — POTASSIUM CHLORIDE CRYS ER 20 MEQ PO TBCR
40.0000 meq | EXTENDED_RELEASE_TABLET | Freq: Once | ORAL | Status: AC
  Administered 2015-03-22: 40 meq via ORAL

## 2015-03-22 MED ORDER — HYDROMORPHONE HCL 1 MG/ML IJ SOLN
0.5000 mg | Freq: Once | INTRAMUSCULAR | Status: AC
  Administered 2015-03-22: 0.5 mg via INTRAVENOUS
  Filled 2015-03-22: qty 1

## 2015-03-22 MED ORDER — POTASSIUM CHLORIDE CRYS ER 20 MEQ PO TBCR
40.0000 meq | EXTENDED_RELEASE_TABLET | Freq: Once | ORAL | Status: AC
  Administered 2015-03-22: 40 meq via ORAL
  Filled 2015-03-22: qty 2

## 2015-03-22 MED ORDER — KCL IN DEXTROSE-NACL 40-5-0.45 MEQ/L-%-% IV SOLN
INTRAVENOUS | Status: DC
  Administered 2015-03-22 – 2015-03-23 (×3): via INTRAVENOUS
  Filled 2015-03-22 (×5): qty 1000

## 2015-03-22 MED ORDER — PNEUMOCOCCAL VAC POLYVALENT 25 MCG/0.5ML IJ INJ: 0.5000 mL | INJECTION | INTRAMUSCULAR | Status: DC | PRN

## 2015-03-22 MED ORDER — KETOROLAC TROMETHAMINE 15 MG/ML IJ SOLN
15.0000 mg | Freq: Once | INTRAMUSCULAR | Status: AC
  Administered 2015-03-22: 15 mg via INTRAVENOUS
  Filled 2015-03-22: qty 1

## 2015-03-22 MED ORDER — SODIUM CHLORIDE 0.9 % IJ SOLN
3.0000 mL | Freq: Two times a day (BID) | INTRAMUSCULAR | Status: DC
  Administered 2015-03-23 – 2015-03-24 (×2): 3 mL via INTRAVENOUS

## 2015-03-22 MED ORDER — ONDANSETRON HCL 4 MG/2ML IJ SOLN: 4.0000 mg | Freq: Four times a day (QID) | INTRAMUSCULAR | Status: DC | PRN

## 2015-03-22 MED ORDER — ACETAMINOPHEN 325 MG PO TABS
650.0000 mg | ORAL_TABLET | Freq: Four times a day (QID) | ORAL | Status: DC | PRN
  Administered 2015-03-22: 650 mg via ORAL
  Filled 2015-03-22: qty 2

## 2015-03-22 MED ORDER — ACETAMINOPHEN 650 MG RE SUPP
650.0000 mg | Freq: Four times a day (QID) | RECTAL | Status: DC | PRN
Start: 2015-03-22 — End: 2015-03-24

## 2015-03-22 MED ORDER — POTASSIUM CHLORIDE IN NACL 40-0.9 MEQ/L-% IV SOLN
INTRAVENOUS | Status: DC
  Administered 2015-03-22 (×2): 125 mL/h via INTRAVENOUS
  Filled 2015-03-22 (×3): qty 1000

## 2015-03-22 MED ORDER — SODIUM CHLORIDE 0.9 % IV BOLUS (SEPSIS)
1000.0000 mL | Freq: Once | INTRAVENOUS | Status: AC
  Administered 2015-03-22: 1000 mL via INTRAVENOUS

## 2015-03-22 MED ORDER — ENOXAPARIN SODIUM 40 MG/0.4ML ~~LOC~~ SOLN
40.0000 mg | SUBCUTANEOUS | Status: DC
  Administered 2015-03-22 – 2015-03-23 (×2): 40 mg via SUBCUTANEOUS
  Filled 2015-03-22 (×4): qty 0.4

## 2015-03-22 MED ORDER — POTASSIUM CHLORIDE 10 MEQ/100ML IV SOLN: 10.0000 meq | INTRAVENOUS | Status: DC

## 2015-03-22 MED ORDER — SODIUM CHLORIDE 0.9 % IV SOLN: Freq: Once | INTRAVENOUS | Status: DC

## 2015-03-22 NOTE — ED Notes (Signed)
Patient transported to X-ray 

## 2015-03-22 NOTE — ED Notes (Signed)
Checked patient blood sugar nit was 528 notified RN Hayley of high blood sugar

## 2015-03-22 NOTE — ED Notes (Signed)
Checked patient blood sugar it was 324 notifiedRN of blood sugar

## 2015-03-22 NOTE — ED Provider Notes (Signed)
Patient presented to the ER with feeling weakness. Patient reports that his blood sugar has been running high. He has noticed a slight headache. He feels like he is dehydrated. He has not had vomiting or diarrhea. There is no chest pain, shortness of breath or abdominal pain.  Face to face Exam: HEENT - PERRLA; mucous membranes are dry Lungs - CTAB Heart - RRR, no M/R/G Abd - S/NT/ND Neuro - alert, oriented x3  Plan: Evaluate for DKA and dehydration. Treat elevated blood sugar with IV fluids.  Gilda Creasehristopher J Marisal Swarey, MD 03/22/15 [phone]

## 2015-03-22 NOTE — Progress Notes (Signed)
  RD consulted for nutrition education regarding diabetes. Patient has been diabetic for ~4 years. He typically has a good appetite and eats well. Blood sugars usually run in the 250's, but have been slowly creeping up over the past few days, so he came to the hospital. He has had diet education in the past, but said that he could always use more information.  Lab Results  Component Value Date   HGBA1C 14.6 11/05/2014    RD provided "Carbohydrate Counting for People with Diabetes" handout from the Academy of Nutrition and Dietetics. Provided list of carbohydrates and recommended serving sizes of common foods. Provided recommendations for label reading.  Discussed importance of controlled and consistent carbohydrate intake throughout the day. Teach back method used.  Expect good compliance.  Body mass index is 20.02 kg/(m^2). Pt meets criteria for normal weight based on current BMI.  Current diet order is NPO, patient is on an insulin drip. Labs and medications reviewed. No further nutrition interventions warranted at this time. RD contact information provided. If additional nutrition issues arise, please re-consult RD.  Joaquin CourtsKimberly Shawan Tosh, RD, LDN, CNSC Pager [phone] After Hours Pager [phone]

## 2015-03-22 NOTE — ED Notes (Signed)
Checked patient blood sugar it was 329 notified the rn hayley of blood sugar

## 2015-03-22 NOTE — ED Provider Notes (Signed)
CSN: [id number]     Arrival date & time 03/22/15  0865 History   First MD Initiated Contact with Patient 03/22/15 [phone]     Chief Complaint  Patient presents with  . Weakness  . Headache     (Consider location/radiation/quality/duration/timing/severity/associated sxs/prior Treatment) Patient is a 29 y.o. male presenting with weakness. The history is provided by the patient.  Weakness This is a recurrent problem. The current episode started yesterday. The problem occurs constantly. The problem has been gradually worsening. Associated symptoms include fatigue, headaches and weakness. Pertinent negatives include no abdominal pain, chest pain, fever or rash. The symptoms are aggravated by exertion. He has tried rest for the symptoms. The treatment provided no relief.    Past Medical History  Diagnosis Date  . Diabetes mellitus without complication (HCC) Dx 2012  . Renal insufficiency 2011    pt was on dialysis for a short time approx 2011, pt is no longer on dialysis   History reviewed. No pertinent past surgical history. Family History  Problem Relation Age of Onset  . Diabetes Father   . Diabetes Maternal Grandmother   . Heart disease Neg Hx   . Hypertension Neg Hx    Social History  Substance Use Topics  . Smoking status: Former Smoker    Quit date: 09/19/2013  . Smokeless tobacco: Never Used  . Alcohol Use: No    Review of Systems  Constitutional: Positive for fatigue. Negative for fever.  HENT: Negative for facial swelling.   Respiratory: Negative for shortness of breath.   Cardiovascular: Negative for chest pain.  Gastrointestinal: Negative for abdominal pain.  Genitourinary: Negative for dysuria.  Musculoskeletal: Negative for back pain.  Skin: Negative for rash.  Neurological: Positive for weakness and headaches.  Psychiatric/Behavioral: Negative for confusion.      Allergies  Review of patient's allergies indicates no known allergies.  Home Medications    Prior to Admission medications   Medication Sig Start Date End Date Taking? Authorizing Provider  insulin aspart protamine- aspart (NOVOLOG MIX 70/30) (70-30) 100 UNIT/ML injection Inject 0.4 mLs (40 Units total) into the skin 2 (two) times daily with a meal. 11/06/14  Yes Josalyn Funches, MD  Insulin Syringe-Needle U-100 (B-D INS SYRINGE 0.5CC/31GX5/16) 31G X 5/16" 0.5 ML MISC 1 each by Does not apply route 2 (two) times daily. 10/09/14  Yes Josalyn Funches, MD  terbinafine (LAMISIL AT) 1 % cream Apply 1 application topically 2 (two) times daily. 12/17/14  Yes Josalyn Funches, MD  TRUEPLUS LANCETS 28G MISC 1 each by Does not apply route 3 (three) times daily. 05/04/14  Yes Josalyn Funches, MD  atorvastatin (LIPITOR) 20 MG tablet Take 1 tablet (20 mg total) by mouth daily. Patient not taking: Reported on 02/09/2015 09/03/14   Albertine Grates, MD  insulin glargine (LANTUS) 100 UNIT/ML injection Inject 0.15 mLs (15 Units total) into the skin at bedtime. Patient not taking: Reported on 02/09/2015 12/17/14   Josalyn Funches, MD   BP 136/74 mmHg  Pulse 107  Temp(Src) 98.2 F (36.8 C) (Oral)  Resp 18  Ht  (1.676 m)  Wt 124 lb (56.246 kg)  BMI 20.02 kg/m2  SpO2 100% Physical Exam  Constitutional: He is oriented to person, place, and time. He appears well-developed and well-nourished. He appears ill. No distress.  HENT:  Head: Normocephalic and atraumatic.  Right Ear: External ear normal.  Left Ear: External ear normal.  Nose: Nose normal.  Mouth/Throat: Oropharynx is clear and moist. No oropharyngeal exudate.  Eyes:  Conjunctivae and EOM are normal. Pupils are equal, round, and reactive to light. Right eye exhibits no discharge. Left eye exhibits no discharge. No scleral icterus.  Neck: Normal range of motion. Neck supple. No JVD present. No tracheal deviation present. No thyromegaly present.  Cardiovascular: Regular rhythm and intact distal pulses.  Tachycardia present.   Pulmonary/Chest: Effort  normal. No stridor. No respiratory distress. He has no wheezes. He has no rales. He exhibits no tenderness.  Abdominal: Soft. He exhibits no distension. There is no tenderness.  Musculoskeletal: Normal range of motion. He exhibits no edema or tenderness.  Lymphadenopathy:    He has no cervical adenopathy.  Neurological: He is alert and oriented to person, place, and time. He has normal strength. He is not disoriented. He displays no atrophy and no tremor. He exhibits normal muscle tone. He displays no seizure activity. Coordination and gait normal. GCS eye subscore is 4. GCS verbal subscore is 5. GCS motor subscore is 6.  Skin: Skin is warm and dry. No rash noted. He is not diaphoretic. No erythema. No pallor.  Psychiatric: He has a normal mood and affect. His behavior is normal. Judgment and thought content normal.  Nursing note and vitals reviewed.   ED Course  Procedures (including critical care time) Labs Review Labs Reviewed  BASIC METABOLIC PANEL - Abnormal; Notable for the following:    Sodium 134 (*)    Potassium 3.4 (*)    CO2 <7 (*)    Glucose, Bld 345 (*)    Creatinine, Ser 1.33 (*)    Calcium 8.6 (*)    All other components within normal limits  URINALYSIS, ROUTINE W REFLEX MICROSCOPIC (NOT AT [facility]) - Abnormal; Notable for the following:    APPearance HAZY (*)    Glucose, UA >1000 (*)    Hgb urine dipstick SMALL (*)    Ketones, ur >80 (*)    Protein, ur 100 (*)    All other components within normal limits  BETA-HYDROXYBUTYRIC ACID - Abnormal; Notable for the following:    Beta-Hydroxybutyric Acid >8.00 (*)    All other components within normal limits  URINE MICROSCOPIC-ADD ON - Abnormal; Notable for the following:    Casts HYALINE CASTS (*)    All other components within normal limits  BASIC METABOLIC PANEL - Abnormal; Notable for the following:    Potassium 3.1 (*)    Chloride 115 (*)    CO2 7 (*)    Glucose, Bld 259 (*)    Creatinine, Ser 1.30 (*)    Calcium  8.8 (*)    Anion gap 17 (*)    All other components within normal limits  HEPATIC FUNCTION PANEL - Abnormal; Notable for the following:    Total Bilirubin 1.6 (*)    Indirect Bilirubin 1.5 (*)    All other components within normal limits  PHOSPHORUS - Abnormal; Notable for the following:    Phosphorus 1.7 (*)    All other components within normal limits  LIPID PANEL - Abnormal; Notable for the following:    Cholesterol 332 (*)    Triglycerides 762 (*)    All other components within normal limits  GLUCOSE, CAPILLARY - Abnormal; Notable for the following:    Glucose-Capillary 209 (*)    All other components within normal limits  GLUCOSE, CAPILLARY - Abnormal; Notable for the following:    Glucose-Capillary 189 (*)    All other components within normal limits  CBG MONITORING, ED - Abnormal; Notable for the following:  Glucose-Capillary 345 (*)    All other components within normal limits  I-STAT VENOUS BLOOD GAS, ED - Abnormal; Notable for the following:    pH, Ven 7.018 (*)    pCO2, Ven 22.5 (*)    pO2, Ven 27.0 (*)    Bicarbonate 5.8 (*)    Acid-base deficit 24.0 (*)    All other components within normal limits  CBG MONITORING, ED - Abnormal; Notable for the following:    Glucose-Capillary 528 (*)    All other components within normal limits  I-STAT CHEM 8, ED - Abnormal; Notable for the following:    Potassium 3.3 (*)    Chloride 112 (*)    Glucose, Bld 349 (*)    Calcium, Ion 1.24 (*)    Hemoglobin 17.3 (*)    All other components within normal limits  CBG MONITORING, ED - Abnormal; Notable for the following:    Glucose-Capillary 329 (*)    All other components within normal limits  CBG MONITORING, ED - Abnormal; Notable for the following:    Glucose-Capillary 324 (*)    All other components within normal limits  CBG MONITORING, ED - Abnormal; Notable for the following:    Glucose-Capillary 205 (*)    All other components within normal limits  MRSA PCR SCREENING   CBC  MAGNESIUM  PROTIME-INR  ETHANOL  BASIC METABOLIC PANEL  BASIC METABOLIC PANEL  HEMOGLOBIN A1C  HIV ANTIBODY (ROUTINE TESTING)  HEPATITIS C ANTIBODY (REFLEX)  PROTEIN / CREATININE RATIO, URINE  URINE RAPID DRUG SCREEN, HOSP PERFORMED  BASIC METABOLIC PANEL  I-STAT VENOUS BLOOD GAS, ED    Imaging Review Dg Chest 2 View  03/22/2015  CLINICAL DATA:  Feeling weak, headache, dehydrated EXAM: CHEST  2 VIEW COMPARISON:  09/30/2014 FINDINGS: The heart size and mediastinal contours are within normal limits. Both lungs are clear. The visualized skeletal structures are unremarkable. IMPRESSION: No active cardiopulmonary disease. Electronically Signed   By: Elige KoHetal  Patel   On: 03/22/2015 12:23   I have personally reviewed and evaluated these images and lab results as part of my medical decision-making.   EKG Interpretation   Date/Time:  Monday March 22 2015 12:51:36 EDT Ventricular Rate:  75 PR Interval:  154 QRS Duration: 84 QT Interval:  389 QTC Calculation: 434 R Axis:   70 Text Interpretation:  Sinus rhythm Probable anteroseptal infarct, old  Baseline wander in lead(s) V6 Non-specific ST-t changes No significant  change since last tracing Confirmed by POLLINA  MD, CHRISTOPHER [phone]([id number]) on  03/22/2015 2:25:33 PM      MDM   Final diagnoses:  Diabetic ketoacidosis without coma associated with type 1 diabetes mellitus (HCC)    Patient endorsing weakness 1 day. Slightly elevated blood sugars. She has a history of prior DKA. Appears chronically ill. Otherwise is not appearing acute distress. Suspect dehydration based on patient's description of symptoms. Laboratory workup obtained and shows evidence of DKA. Patient was started on fluids and insulin infusion. He is remaining hemodynamically stable in the emergency department. No other obvious findings on review of systems or physical exam suggest a source of focal infection or other process which needs further evaluation at this  time. Patient does endorse being compliant with his medications. Patient was discussed with medicine for admission to stepdown unit. Patient was admitted for further management.    Gavin PoundJustin Samyukta Cura, MD 03/22/15 1616  Gilda Creasehristopher J Pollina, MD 03/25/15 [phone]

## 2015-03-22 NOTE — ED Notes (Signed)
Pt c/o feeling weakness, headache and dehydrated onset yesterday. Pt reports felt not well on Saturday but was able to get through work. Blood sugar yesterday was 260 and 340.

## 2015-03-22 NOTE — ED Notes (Signed)
Attempted report 

## 2015-03-22 NOTE — H&P (Signed)
Date: 03/22/2015               Patient Name:  [name] [name] MRN: [id number]  DOB: October 21, 1985 Age / Sex: 29 y.o., male   PCP: Dessa Phi, MD         Medical Service: Internal Medicine Teaching Service         Attending Physician: Dr. Ginnie Smart, MD    First Contact: Dr. Dimple Casey  Pager: [phone]  Second Contact: Dr. Beckie Salts  Pager: [phone]       After Hours (After 5p/  First Contact Pager: [phone]  weekends / holidays): Second Contact Pager: [phone]   Chief Complaint: decreased appetite, blurry vision, weakness  History of Present Illness:   [name] [name] is a 29 year old man with past medical history of Type 1 DM, hyperlipidemia, and history of renal failure requiring HD in 2013 who presents with decreased appetite, blurry vision, and weakness.   He reports feeling his normal self until late Friday (three days ago) where he had decreased appetite that then progressed to decreased PO intake, weakness, fatigue, headache, and blurry vision with unsteady gait. He reports fatigue and weakness yesterday and spent most of the day resting. He has dry mouth with polydipsia but denies polyuria, or polyphagia. He denies fever, chills, chest pain, dyspnea, cough, wheezing, sinus tenderness, rhinorrhea, sore throat, tooth pain, night sweating, joint pain, rash, LE edema/pain, abdominal pain, nausea, vomiting, change in BM/urination, or change in mental status. He reports unintentional weight loss of 6-7 lb in the past 2-3 months. 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 He does not recall having a diabetic eye exam before. He has received flu shot this year.   His last A1c was 14.6 on 11/05/14. He was diagnosed with Type 1 DM when he was 25 after presenting with polyuria while incarcerated. His father had Type 1 DM as well as his uncle. He has had 8 admissions for DKA since October of 2013 with last admission that required ICU stay in May of this year. He required temporary HD in October of 2013 for  acute renal failure of unknown etiology. He reports compliance with taking Novolog 70/30 mix 40 U twice a day and checks his blood sugar 3-4 times a day with average in 250 range. However for the past 2-3 days he has noticed his blood sugars elevated to the 300's. He admits to frequent snacking (chips, sausage, and sugar-free sweets) butdenies recent change in his dietary habits. He denies symptomatic hypoglycemia. He was seen by his PCP on 12/17/14 and was instructed to change to Lantus 15 U daily due to uncontrolled blood sugars of >250 however never did so.   He denies alcohol, tobacco, or drug use. He is currently employed at [employer].   He currently feels much better and denies any complaints.    Meds: Current Facility-Administered Medications  Medication Dose Route Frequency Provider Last Rate Last Dose  . 0.9 % NaCl with KCl 40 mEq / L  infusion   Intravenous Continuous Gavin Pound, MD      . acetaminophen (TYLENOL) tablet 650 mg  650 mg Oral Q6H PRN Otis Brace, MD       Or  . acetaminophen (TYLENOL) suppository 650 mg  650 mg Rectal Q6H PRN Victory Strollo, MD      . dextrose 5 % and 0.45 % NaCl with KCl 40 mEq/L infusion   Intravenous Continuous Gavin Pound, MD      . enoxaparin (LOVENOX) injection 40  mg  40 mg Subcutaneous Q24H Detria Cummings, MD      . insulin regular (NOVOLIN R,HUMULIN R) 250 Units in sodium chloride 0.9 % 250 mL (1 Units/mL) infusion   Intravenous Continuous Gavin PoundJustin Brooten, MD 2.7 mL/hr at 03/22/15 1126 2.7 Units/hr at 03/22/15 1126  . ondansetron (ZOFRAN) tablet 4 mg  4 mg Oral Q6H PRN Jalyric Kaestner, MD       Or  . ondansetron (ZOFRAN) injection 4 mg  4 mg Intravenous Q6H PRN Kayleana Waites, MD      . sodium chloride 0.9 % injection 3 mL  3 mL Intravenous Q12H Otis BraceMarjan Berline Semrad, MD       Current Outpatient Prescriptions  Medication Sig Dispense Refill  . insulin aspart protamine- aspart (NOVOLOG MIX 70/30) (70-30) 100 UNIT/ML injection Inject 0.4 mLs  (40 Units total) into the skin 2 (two) times daily with a meal. 10 mL 3  . Insulin Syringe-Needle U-100 (B-D INS SYRINGE 0.5CC/31GX5/16) 31G X 5/16" 0.5 ML MISC 1 each by Does not apply route 2 (two) times daily. 60 each 11  . terbinafine (LAMISIL AT) 1 % cream Apply 1 application topically 2 (two) times daily. 30 g 0  . TRUEPLUS LANCETS 28G MISC 1 each by Does not apply route 3 (three) times daily. 100 each 12  . atorvastatin (LIPITOR) 20 MG tablet Take 1 tablet (20 mg total) by mouth daily. (Patient not taking: Reported on 02/09/2015) 30 tablet 3  . insulin glargine (LANTUS) 100 UNIT/ML injection Inject 0.15 mLs (15 Units total) into the skin at bedtime. (Patient not taking: Reported on 02/09/2015) 10 mL 11    Allergies: Allergies as of 03/22/2015  . (No Known Allergies)   Past Medical History  Diagnosis Date  . Diabetes mellitus without complication (HCC) Dx 2012  . Renal insufficiency 2011    pt was on dialysis for a short time approx 2011, pt is no longer on dialysis   History reviewed. No pertinent past surgical history. Family History  Problem Relation Age of Onset  . Diabetes Father   . Diabetes Maternal Grandmother   . Heart disease Neg Hx   . Hypertension Neg Hx    Social History   Social History  . Marital Status: Single    Spouse Name: N/A  . Number of Children: 1   . Years of Education: 12    Occupational History  . Unemployed     Social History Main Topics  . Smoking status: Former Smoker    Quit date: 09/19/2013  . Smokeless tobacco: Never Used  . Alcohol Use: No  . Drug Use: No  . Sexual Activity: Not on file   Other Topics Concern  . Not on file   Social History Narrative   Lives with mother and older brother.   Has 1 daughter, age 31.           Review of Systems: Pertinent items are noted in HPI.  Physical Exam: Blood pressure 134/74, pulse 98, temperature 98.2 F (36.8 C), temperature source Oral, resp. rate 18, height 5\' 6"  (1.676 m),  weight 124 lb (56.246 kg), SpO2 100 %.  General: NAD, thin appearing  HEENT: No oral thrush, poor dentition with no evidence of tooth abscess. No sinus tenderness. Neck: Supple, normal ROM Heart: Normal rate and rhythm with no murmur Lungs: Clear to auscultation with no wheezing, ronchi, or rales Abdomen: Soft, non-tender, non-distended, normal BS Extremities: No edema or joint swelling/erythema/tenderness  Skin: No rashes or jaundice  Lab results: Basic Metabolic Panel:  Recent Labs  13/08/65 0902 03/22/15 0935  NA 134* 137  K 3.4* 3.3*  CL 110 112*  CO2 <7*  --   GLUCOSE 345* 349*  BUN 6 9  CREATININE 1.33* 0.80  CALCIUM 8.6*  --    CBC:  Recent Labs  03/22/15 0902 03/22/15 0935  WBC 7.5  --   HGB 15.6 17.3*  HCT 44.1 51.0  MCV 88.4  --   PLT 229  --    CBG:  Recent Labs  03/22/15 0854 03/22/15 0959 03/22/15 1122  GLUCAP 345* 528* 329*   Urine Drug Screen: Drugs of Abuse     Component Value Date/Time   LABOPIA NONE DETECTED 09/30/2014 1101   COCAINSCRNUR NONE DETECTED 09/30/2014 1101   LABBENZ NONE DETECTED 09/30/2014 1101   AMPHETMU NONE DETECTED 09/30/2014 1101   THCU NONE DETECTED 09/30/2014 1101   LABBARB NONE DETECTED 09/30/2014 1101   Urinalysis:  Recent Labs  03/22/15 0913  COLORURINE YELLOW  LABSPEC 1.021  PHURINE 5.0  GLUCOSEU >1000*  HGBUR SMALL*  BILIRUBINUR NEGATIVE  KETONESUR >80*  PROTEINUR 100*  UROBILINOGEN 0.2  NITRITE NEGATIVE  LEUKOCYTESUR NEGATIVE     Imaging results:  Dg Chest 2 View  03/22/2015  CLINICAL DATA:  Feeling weak, headache, dehydrated EXAM: CHEST  2 VIEW COMPARISON:  09/30/2014 FINDINGS: The heart size and mediastinal contours are within normal limits. Both lungs are clear. The visualized skeletal structures are unremarkable. IMPRESSION: No active cardiopulmonary disease. Electronically Signed   By: Elige Ko   On: 03/22/2015 12:23    Other results: EKG: pending   Assessment & Plan by  Problem:  DKA in setting of Uncontrolled Type 1 DM - Pt with three-day history of decreased PO intake, weakness, polydipsia, and blurry vision found to have blood sugar of 345 on admission with venous pH of 7.018, bicarbonate <7, uncalculated AG, and evidence of serum and urine ketones consistent with DKA. Etiology of DKA most likely due to dietary indiscretion in setting of inadequate insulin regimen vs noncompliance. Pt with uncontrolled Type 1 DM with last A1c of 14.6 on 11/05/14. Pt with no signs of infection or ischemia as triggers for DKA. -Admit to step-down unit and continue DKA protocol  -Keep NPO -Continue insulin drip and NS with KCl 40 mEq at 125 mL/hr -Replete potassium to keep >4  -Monitor BMP Q 4 hr and CBG Q2 hr -Obtain CXR, 12-lead EKG, and UDS -Obtain A1c, lipid panel, and protein:Cr ratio  -Consult diabetes coordinator and RD  -Administer PSV 23 before discharge  Non-oliguric AKI - Pt with Cr 1.33 on admission with last Cr 0.85 on 10/09/14. Pt reports normal urine output. Etiology most likely pre-renal azotemia due to volume depletion in setting of DKA. Pt with history of acute renal in October of 2013 that required HD due to unclear etiology.  -Continue NS 125 mL/hr -Monitor daily weights and strict I & O's -Monitor BMP  -Avoid nephrotoxins   Hyperlipidemia - Last lipid panel on 04/26/14 with LDL 121. Unclear if pt compliant with home lipitor. -Obtain lipid panel  -Resume home lipitor 20 mg daily once able to tolerate PO  Diet: NPO DVT Ppx: Lovenox daily  Code: Full  Dispo: Disposition is deferred at this time, awaiting improvement of current medical problems. Anticipated discharge in approximately 1-3 day(s).   The patient does have a current PCP (Dessa Phi, MD) and does need an [facility] hospital follow-up appointment after discharge.  The patient  does not have transportation limitations that hinder transportation to clinic appointments.  Signed: Otis Brace,  MD 03/22/2015, 11:57 AM

## 2015-03-23 DIAGNOSIS — N179 Acute kidney failure, unspecified: Secondary | ICD-10-CM

## 2015-03-23 DIAGNOSIS — Z794 Long term (current) use of insulin: Secondary | ICD-10-CM

## 2015-03-23 DIAGNOSIS — E1010 Type 1 diabetes mellitus with ketoacidosis without coma: Principal | ICD-10-CM

## 2015-03-23 DIAGNOSIS — K0889 Other specified disorders of teeth and supporting structures: Secondary | ICD-10-CM

## 2015-03-23 DIAGNOSIS — E785 Hyperlipidemia, unspecified: Secondary | ICD-10-CM

## 2015-03-23 LAB — HIV ANTIBODY (ROUTINE TESTING W REFLEX): HIV Screen 4th Generation wRfx: NONREACTIVE

## 2015-03-23 LAB — CBC
HEMATOCRIT: 36.7 % — AB (ref 39.0–52.0)
Hemoglobin: 13 g/dL (ref 13.0–17.0)
MCH: 30.9 pg (ref 26.0–34.0)
MCHC: 35.4 g/dL (ref 30.0–36.0)
MCV: 87.2 fL (ref 78.0–100.0)
PLATELETS: 179 10*3/uL (ref 150–400)
RBC: 4.21 MIL/uL — ABNORMAL LOW (ref 4.22–5.81)
RDW: 12.9 % (ref 11.5–15.5)
WBC: 4.1 10*3/uL (ref 4.0–10.5)

## 2015-03-23 LAB — BASIC METABOLIC PANEL
ANION GAP: 7 (ref 5–15)
Anion gap: 5 (ref 5–15)
BUN: 9 mg/dL (ref 6–20)
BUN: 10 mg/dL (ref 6–20)
CALCIUM: 8 mg/dL — AB (ref 8.9–10.3)
CHLORIDE: 119 mmol/L — AB (ref 101–111)
CHLORIDE: 114 mmol/L — AB (ref 101–111)
CO2: 11 mmol/L — ABNORMAL LOW (ref 22–32)
CO2: 14 mmol/L — ABNORMAL LOW (ref 22–32)
CREATININE: 0.91 mg/dL (ref 0.61–1.24)
CREATININE: 0.86 mg/dL (ref 0.61–1.24)
Calcium: 8.1 mg/dL — ABNORMAL LOW (ref 8.9–10.3)
GFR calc non Af Amer: >60 mL/min (ref >60)
Glucose, Bld: 146 mg/dL — ABNORMAL HIGH (ref 65–99)
Glucose, Bld: 172 mg/dL — ABNORMAL HIGH (ref 65–99)
POTASSIUM: 4 mmol/L (ref 3.5–5.1)
Potassium: 3.2 mmol/L — ABNORMAL LOW (ref 3.5–5.1)
SODIUM: 137 mmol/L (ref 135–145)
SODIUM: 133 mmol/L — AB (ref 135–145)

## 2015-03-23 LAB — PROTEIN / CREATININE RATIO, URINE
CREATININE, URINE: 46.2 mg/dL
Protein Creatinine Ratio: 2.03 mg/mg{Cre} — ABNORMAL HIGH (ref 0.00–0.15)
TOTAL PROTEIN, URINE: 94 mg/dL

## 2015-03-23 LAB — HCV COMMENT:

## 2015-03-23 LAB — GLUCOSE, CAPILLARY
GLUCOSE-CAPILLARY: 154 mg/dL — AB (ref 65–99)
GLUCOSE-CAPILLARY: 185 mg/dL — AB (ref 65–99)
GLUCOSE-CAPILLARY: 201 mg/dL — AB (ref 65–99)
GLUCOSE-CAPILLARY: 313 mg/dL — AB (ref 65–99)
GLUCOSE-CAPILLARY: 332 mg/dL — AB (ref 65–99)
Glucose-Capillary: 127 mg/dL — ABNORMAL HIGH (ref 65–99)
Glucose-Capillary: 320 mg/dL — ABNORMAL HIGH (ref 65–99)
Glucose-Capillary: 70 mg/dL (ref 65–99)

## 2015-03-23 LAB — HEMOGLOBIN A1C
Hgb A1c MFr Bld: 16.2 % — ABNORMAL HIGH (ref 4.8–5.6)
MEAN PLASMA GLUCOSE: 418 mg/dL

## 2015-03-23 LAB — HEPATITIS C ANTIBODY (REFLEX): HCV Ab: <0.1 s/co ratio (ref 0.0–0.9)

## 2015-03-23 MED ORDER — INSULIN STARTER KIT- PEN NEEDLES (ENGLISH)
1.0000 | Freq: Once | Status: AC
  Administered 2015-03-23: 1
  Filled 2015-03-23 (×2): qty 1

## 2015-03-23 MED ORDER — INSULIN ASPART 100 UNIT/ML ~~LOC~~ SOLN
0.0000 [IU] | SUBCUTANEOUS | Status: DC
  Administered 2015-03-23: 5 [IU] via SUBCUTANEOUS
  Administered 2015-03-23: 11 [IU] via SUBCUTANEOUS

## 2015-03-23 MED ORDER — INSULIN ASPART PROT & ASPART (70-30 MIX) 100 UNIT/ML ~~LOC~~ SUSP
25.0000 [IU] | Freq: Once | SUBCUTANEOUS | Status: AC
  Administered 2015-03-23: 25 [IU] via SUBCUTANEOUS
  Filled 2015-03-23: qty 10

## 2015-03-23 MED ORDER — LISINOPRIL 5 MG PO TABS
5.0000 mg | ORAL_TABLET | Freq: Every day | ORAL | Status: DC
  Administered 2015-03-23 – 2015-03-24 (×2): 5 mg via ORAL
  Filled 2015-03-23 (×2): qty 1

## 2015-03-23 MED ORDER — SODIUM CHLORIDE 0.9 % IV SOLN
INTRAVENOUS | Status: DC
  Filled 2015-03-23: qty 2.5

## 2015-03-23 MED ORDER — INSULIN ASPART 100 UNIT/ML ~~LOC~~ SOLN
0.0000 [IU] | Freq: Three times a day (TID) | SUBCUTANEOUS | Status: DC
  Administered 2015-03-23 – 2015-03-24 (×2): 7 [IU] via SUBCUTANEOUS

## 2015-03-23 MED ORDER — INSULIN ASPART 100 UNIT/ML ~~LOC~~ SOLN
4.0000 [IU] | Freq: Three times a day (TID) | SUBCUTANEOUS | Status: DC
  Administered 2015-03-23: 4 [IU] via SUBCUTANEOUS

## 2015-03-23 MED ORDER — INSULIN GLARGINE 100 UNIT/ML ~~LOC~~ SOLN
25.0000 [IU] | Freq: Every day | SUBCUTANEOUS | Status: DC
  Administered 2015-03-23: 25 [IU] via SUBCUTANEOUS
  Filled 2015-03-23 (×2): qty 0.25

## 2015-03-23 MED ORDER — POTASSIUM CHLORIDE CRYS ER 20 MEQ PO TBCR: 40.0000 meq | EXTENDED_RELEASE_TABLET | Freq: Once | ORAL | Status: DC

## 2015-03-23 MED ORDER — INSULIN ASPART PROT & ASPART (70-30 MIX) 100 UNIT/ML ~~LOC~~ SUSP: 25.0000 [IU] | Freq: Two times a day (BID) | SUBCUTANEOUS | Status: DC

## 2015-03-23 MED ORDER — LIVING WELL WITH DIABETES BOOK
Freq: Once | Status: AC
  Administered 2015-03-23: 12:00:00
  Filled 2015-03-23: qty 1

## 2015-03-23 NOTE — Progress Notes (Signed)
Report called to [place], RN on 6E. Patient to be transferred to 6E22 via wheelchair by [place], NT. Belongings sent with patient.

## 2015-03-23 NOTE — Progress Notes (Signed)
Subjective: Patient and iron gap and metabolic acidosis rapidly close overnight. He was transitioned to home 70/30 insulin treatment this morning. D5W drip was discontinued at breakfast time as patient was able to tolerate meals without difficulty. At that point he is reporting little to no symptoms and eager to go home soon. Discussed with patient his current hospital evaluations and admissions and need for improvement to glycemic control plan. Patient agreed and was transitioned to a basal bolus regimen with 2 discussions with diabetes coordinator.  Objective: Vital signs in last 24 hours: Filed Vitals:   03/23/15 0844 03/23/15 1200 03/23/15 1540 03/23/15 1716  BP:  109/63 116/64 120/78  Pulse:  92 95 85  Temp: 97.4 F (36.3 C) 98.6 F (37 C) 98.4 F (36.9 C) 98.5 F (36.9 C)  TempSrc: Oral Oral Oral Oral  Resp:  $Remo'15 13 15  'TqWhT$ Height:      Weight:      SpO2:  100% 100% 100%   Weight change:   Intake/Output Summary (Last 24 hours) at 03/23/15 1744 Last data filed at 03/23/15 1400  Gross per 24 hour  Intake 2870.05 ml  Output   1750 ml  Net 1120.05 ml   GENERAL- alert, co-operative, NAD HEENT- PERRL, oral mucosa appears moist CARDIAC- RRR, no murmurs, rubs or gallops. RESP- CTAB, no wheezes or crackles. ABDOMEN- Soft, nontender, no guarding or rebound, normoactive bowel sounds present. EXTREMITIES- pulse 2+, symmetric, no pedal edema. SKIN- Warm, dry, No rash or lesion. PSYCH- Normal mood and affect, appropriate thought content and speech.  Lab Results: Basic Metabolic Panel:  Recent Labs Lab 03/22/15 1318  03/23/15 0101 03/23/15 0507  NA 139  < > 137 133*  K 3.1*  < > 4.0 3.2*  CL 115*  < > 119* 114*  CO2 7*  < > 11* 14*  GLUCOSE 259*  < > 146* 172*  BUN 11  < > 9 10  CREATININE 1.30*  < > 0.91 0.86  CALCIUM 8.8*  < > 8.1* 8.0*  MG 1.9  --   --   --   PHOS 1.7*  --   --   --   < > = values in this interval not displayed. Liver Function Tests:  Recent  Labs Lab 03/22/15 1318  AST 19  ALT 19  ALKPHOS 82  BILITOT 1.6*  PROT 7.2  ALBUMIN 3.9   No results for input(s): LIPASE, AMYLASE in the last 168 hours. No results for input(s): AMMONIA in the last 168 hours. CBC:  Recent Labs Lab 03/22/15 0902 03/22/15 0935 03/23/15 0507  WBC 7.5  --  4.1  HGB 15.6 17.3* 13.0  HCT 44.1 51.0 36.7*  MCV 88.4  --  87.2  PLT 229  --  179   Cardiac Enzymes: No results for input(s): CKTOTAL, CKMB, CKMBINDEX, TROPONINI in the last 168 hours. BNP: No results for input(s): PROBNP in the last 168 hours. D-Dimer: No results for input(s): DDIMER in the last 168 hours. CBG:  Recent Labs Lab 03/23/15 0127 03/23/15 0308 03/23/15 0402 03/23/15 0842 03/23/15 1200 03/23/15 1539  GLUCAP 154* 185* 201* 70 313* 332*   Hemoglobin A1C:  Recent Labs Lab 03/22/15 1318  HGBA1C 16.2*   Fasting Lipid Panel:  Recent Labs Lab 03/22/15 1318  CHOL 332*  HDL 42  LDLCALC UNABLE TO CALCULATE IF TRIGLYCERIDE OVER 400 mg/dL  TRIG 762*  CHOLHDL 7.9   Thyroid Function Tests: No results for input(s): TSH, T4TOTAL, FREET4, T3FREE, THYROIDAB in the  last 168 hours. Coagulation:  Recent Labs Lab 03/22/15 1318  LABPROT 14.8  INR 1.14   Anemia Panel: No results for input(s): VITAMINB12, FOLATE, FERRITIN, TIBC, IRON, RETICCTPCT in the last 168 hours. Urine Drug Screen: Drugs of Abuse     Component Value Date/Time   LABOPIA NONE DETECTED 03/22/2015 1600   COCAINSCRNUR NONE DETECTED 03/22/2015 1600   LABBENZ NONE DETECTED 03/22/2015 1600   AMPHETMU NONE DETECTED 03/22/2015 1600   THCU POSITIVE* 03/22/2015 1600   LABBARB NONE DETECTED 03/22/2015 1600    Alcohol Level:  Recent Labs Lab 03/22/15 1319  ETH <5   Urinalysis:  Recent Labs Lab 03/22/15 0913  COLORURINE YELLOW  LABSPEC 1.021  PHURINE 5.0  GLUCOSEU >1000*  HGBUR SMALL*  BILIRUBINUR NEGATIVE  KETONESUR >80*  PROTEINUR 100*  UROBILINOGEN 0.2  NITRITE NEGATIVE   LEUKOCYTESUR NEGATIVE   Misc. Labs:   Micro Results: Recent Results (from the past 240 hour(s))  MRSA PCR Screening     Status: None   Collection Time: 03/22/15  2:48 PM  Result Value Ref Range Status   MRSA by PCR NEGATIVE NEGATIVE Final    Comment:        The GeneXpert MRSA Assay (FDA approved for NASAL specimens only), is one component of a comprehensive MRSA colonization surveillance program. It is not intended to diagnose MRSA infection nor to guide or monitor treatment for MRSA infections.    Studies/Results: Dg Chest 2 View  03/22/2015  CLINICAL DATA:  Feeling weak, headache, dehydrated EXAM: CHEST  2 VIEW COMPARISON:  09/30/2014 FINDINGS: The heart size and mediastinal contours are within normal limits. Both lungs are clear. The visualized skeletal structures are unremarkable. IMPRESSION: No active cardiopulmonary disease. Electronically Signed   By: Kathreen Devoid   On: 03/22/2015 12:23   Medications: I have reviewed the patient's current medications. Scheduled Meds: . enoxaparin (LOVENOX) injection  40 mg Subcutaneous Q24H  . insulin aspart  0-9 Units Subcutaneous TID WC  . insulin aspart  4 Units Subcutaneous TID WC  . insulin glargine  25 Units Subcutaneous QHS  . insulin starter kit- pen needles  1 kit Other Once  . potassium chloride  40 mEq Oral Once  . sodium chloride  3 mL Intravenous Q12H   Continuous Infusions:  PRN Meds:.acetaminophen **OR** acetaminophen, ondansetron **OR** ondansetron (ZOFRAN) IV, pneumococcal 23 valent vaccine Assessment/Plan: DKA from chronically uncontrolled T1DM - DKA appears triggered by noncompliance with insulin and dietary indiscretion, patient states he has "just been working a lot". He does not have any history of recent infection or other problem that would seem to be an obvious trigger for this episode. We'll plan for significant change of insulin regimen from twice a day 70/30 to basal-bolus regimen. Nursing staff and  diabetes coronary or help with education until tomorrow on how to manage this hopefully adequate until his outpatient follow-up.  -25 units Lantus daily at bedtime -4 units NovoLog 3 times a day before meals -Sensitive sliding scale insulin -Administer PSV 23 before discharge  Non-oliguric AKI - dramatically improved with adequate hydration, back down to 0.86. Of note he did have some proteinuria on urinalysis. Given his poorly controlled diabetes at this time we would like to trial starting ACEI. -Start lisinopril 5 mg by mouth -Monitor BMP   Hyperlipidemia -lipitor 20 mg daily  Diet: Carb modified DVT ppx: Lovenox FULL CODE  Dispo: Disposition is deferred at this time, awaiting improvement of current medical problems. Anticipated discharge in approximately 1 day(s).  The patient does have a current PCP ([name], MD) and does need an [facility] hospital follow-up appointment after discharge.  The patient does not have transportation limitations that hinder transportation to clinic appointments.   LOS: 1 day   Collier Salina, MD 03/23/2015, 5:44 PM

## 2015-03-23 NOTE — Progress Notes (Signed)
   03/23/15 1600  Clinical Encounter Type  Visited With Patient  Visit Type Initial  Referral From Patient  Consult/Referral To Chaplain  Spiritual Encounters  Spiritual Needs Literature  Advance Directives (For Healthcare)  Does patient have an advance directive? No  CH visited with pt and dropped of literature for an advanced directive; pt will complete.  Contact spiritual care 11/2 between 9-3:30 for notarization and finalization.  [id number]. Erline LevineMichael I Jahlisa Rossitto 4:59 PM

## 2015-03-23 NOTE — Progress Notes (Addendum)
Inpatient Diabetes Program Recommendations  AACE/ADA: New Consensus Statement on Inpatient Glycemic Control (2015)  Target Ranges:  Prepandial:   less than 140 mg/dL      Peak postprandial:   less than 180 mg/dL (1-2 hours)      Critically ill patients:  140 - 180 mg/dL   MD, please review and consider starting the lantus at 25 units at [facility] tonight and meal coverage at supper with sensitive correction tidwc and HS. Consult received for patient admitted with DKA (see previous note) Have spoken with patient at length regarding barriers to glycemic control. Patient is extremely grateful for the [facility] assistance and guidance. He is trying extremely hard to get his glucose under control, but he has not fully understood the instructions he has been given regarding his insulin doses. He states he was told to start the lantus but he thought he was to take the lantus in addition to the 70/30 and was afraid of hypoglycemia. Pt states he is having the rollercoaster affect of hypoglycemia followed by treatment and attempt to prevent further hypoglycemia, then hyperglycemia follows. Pt is most agreeable to following the lantus and novolog meal coverage/correction regimen.  He just needs explicit instruction to check cbg's before each meal and at bedtime and use the correction for cbg's (sensitive correction and HS scales) and meal coverage based on carbohydrate intake or fixed dose of 4 units with each meal (correction and meal coverage taken together.) Lantus once a day at 25 units (HS or AM). Pt states he also runs out of syringes and can't always afford them. Sometimes runs out of syringes (pen needles?) rather than the insulin. Will try to find assistance with this as well. Have ordered patient education via videos and ed'l booklet and will take him information on measuring and counting carbs. Please consider this regimen asap to assess effectiveness while here. Paged MD team to review and consider  recommendations. AD-spoke with Dr Leland Her with basal/bolus regimen-will work with patient this afternoon regarding how to use the basal at HS, 3-4 units mc tidwc with appropriate carbohydrate intake and correction tidwc and HS. Ad-spoke with patient regarding again his new regimen that would be tried while here. Patient expressed understanding, however he has been using syringes for insulin. He states he was not using pens. Have ordered insulin pen starter kit and instruction to watch the ed'l video on insulin pen instruction. (he can get the pens at the [facility])  Thank you Rosita Kea, RN, MSN, CDE  Diabetes Inpatient Program Office: [phone] Pager: [phone] 8:00 am to 5:00 pm

## 2015-03-23 NOTE — Progress Notes (Signed)
UR COMPLETED  

## 2015-03-23 NOTE — Progress Notes (Addendum)
Inpatient Diabetes Program Recommendations  AACE/ADA: New Consensus Statement on Inpatient Glycemic Control (2015)  Target Ranges:  Prepandial:   less than 140 mg/dL      Peak postprandial:   less than 180 mg/dL (1-2 hours)      Critically ill patients:  140 - 180 mg/dL   AD: After speaking with patient, please disregard the 70/30 recommendations and refer to the next note for basal/bolus recommendations.   Diabetes history: DM 1 Outpatient Diabetes medications: 70/30 45 units bid Current orders for Inpatient glycemic control: moderate correction q 4 hrs (1 dose of 70/30 given this am during transition from IV insulin drip to subcutaneous insulin  Inpatient Diabetes Program Recommendations: Noted patient has had multiple admissions since diagnosis in 2013 for DKA. Noted patient has been advised on prior hospitalizations to use a basal/bolus regimen rather than a fixed 70/30 dose bid. Patient's weight of 51 kg should require at most 25 units 70/30 bid (vs 45 units bid). Higher doses most probably have caused glucose lows and a rollercoaster affect.    If patient insists on using the 70/30 bid, patient needs to continue to check cbg's tid and HS and correct as needed. Realistically, at home this patient would benefit from a basal/bolus regimen of lantus/levemir 250-25 units and meal coverage novolog of 6-8 units tidwc as well as a sensitive correction scale tidwc. I will talk with patient as previously have done with patient regarding need for control and means to achieve control and potential means to an improved outlook on diagnosis and prognosis. If patient to continue 70/30 while here, please order 20-25 units 70/30 bid (am and ac supper), and sensitive correction tidwc. If to use basal/bolus, please consider using 20-25 units lantus (or levemir 12 units am and HS) and 4-6 units meal coverage (to start) and sensitive correction tidwc.  Thank you Lenor CoffinAnn Lashona Schaaf, RN, MSN, CDE  Diabetes Inpatient  Program Office: [phone] Pager: [phone] 8:00 am to 5:00 pm

## 2015-03-24 LAB — BASIC METABOLIC PANEL
ANION GAP: 10 (ref 5–15)
BUN: 8 mg/dL (ref 6–20)
CALCIUM: 8.9 mg/dL (ref 8.9–10.3)
CO2: 18 mmol/L — ABNORMAL LOW (ref 22–32)
Chloride: 109 mmol/L (ref 101–111)
Creatinine, Ser: 0.69 mg/dL (ref 0.61–1.24)
GFR calc Af Amer: >60 mL/min (ref >60)
GLUCOSE: 295 mg/dL — AB (ref 65–99)
Potassium: 3.6 mmol/L (ref 3.5–5.1)
Sodium: 137 mmol/L (ref 135–145)

## 2015-03-24 LAB — GLUCOSE, CAPILLARY
Glucose-Capillary: 318 mg/dL — ABNORMAL HIGH (ref 65–99)
Glucose-Capillary: 238 mg/dL — ABNORMAL HIGH (ref 65–99)

## 2015-03-24 MED ORDER — INSULIN ASPART 100 UNIT/ML ~~LOC~~ SOLN: 7.0000 [IU] | Freq: Three times a day (TID) | SUBCUTANEOUS | Status: DC

## 2015-03-24 MED ORDER — INSULIN ASPART 100 UNIT/ML FLEXPEN: 8.0000 [IU] | PEN_INJECTOR | Freq: Three times a day (TID) | SUBCUTANEOUS | Status: DC

## 2015-03-24 MED ORDER — LISINOPRIL 5 MG PO TABS
5.0000 mg | ORAL_TABLET | Freq: Every day | ORAL | Status: DC
Start: 2015-03-24 — End: 2015-06-06

## 2015-03-24 MED ORDER — INSULIN GLARGINE 100 UNIT/ML SOLOSTAR PEN: 25.0000 [IU] | PEN_INJECTOR | Freq: Every day | SUBCUTANEOUS | Status: DC

## 2015-03-24 NOTE — Discharge Summary (Signed)
Name: [name] [name] MRN: [id number] DOB: Nov 30, 1985 29 y.o. PCP: [name], MD  Date of Admission: 03/22/2015  8:35 AM Date of Discharge: 03/24/2015 Attending Physician: Campbell Riches, MD  Discharge Diagnosis: Principal Problem:   DKA (diabetic ketoacidoses) ([place]) Active Problems:   HLD (hyperlipidemia)   DKA, type 1 ([place])  Discharge Medications:   Medication List    STOP taking these medications        insulin aspart protamine- aspart (70-30) 100 UNIT/ML injection  Commonly known as:  NOVOLOG MIX 70/30     insulin glargine 100 UNIT/ML injection  Commonly known as:  LANTUS  Replaced by:  Insulin Glargine 100 UNIT/ML Solostar Pen      TAKE these medications        atorvastatin 20 MG tablet  Commonly known as:  LIPITOR  Take 1 tablet (20 mg total) by mouth daily.     insulin aspart 100 UNIT/ML FlexPen  Commonly known as:  NOVOLOG  Inject 8 Units into the skin 3 (three) times daily with meals.     Insulin Glargine 100 UNIT/ML Solostar Pen  Commonly known as:  LANTUS  Inject 25 Units into the skin daily at 10 pm.     Insulin Syringe-Needle U-100 31G X 5/16" 0.5 ML Misc  Commonly known as:  B-D INS SYRINGE 0.5CC/31GX5/16  1 each by Does not apply route 2 (two) times daily.     lisinopril 5 MG tablet  Commonly known as:  PRINIVIL,ZESTRIL  Take 1 tablet (5 mg total) by mouth daily.     terbinafine 1 % cream  Commonly known as:  LAMISIL AT  Apply 1 application topically 2 (two) times daily.     TRUEPLUS LANCETS 28G Misc  1 each by Does not apply route 3 (three) times daily.        Disposition and follow-up:   [name] [name] was discharged from [facility] in Good condition.  At the hospital follow up visit please address:  1.  Diabetes management: Patient was discharged on new lantus 25U qhs and novolog 7U TIDAC with instruction to records blood sugar 3 times daily. He will likely need adjustment to this dosing to fit with his  outpatient diet and lifestyle.  Also he has previously missed doses due to lack of access to his medications and supplies, which he gets through [facility]. Please make sure he has appropriate access or if he can qualify for additional assistance, since he has recurrent hospitalizations for this problem.  He was also started on 38m lisinopril for his proteinuria in setting of diabetes.  2.  Labs / imaging needed at time of follow-up: Basic metabolic panel  Follow-up Appointments:     Follow-up Information    [facility].   Why:  For check up and diabetes management   Contact information:   [address] [place] [phone]     Discharge Instructions: Discharge Instructions    Diet - low sodium heart healthy    Complete by:  As directed      Increase activity slowly    Complete by:  As directed            Consultations:    Procedures Performed:  Dg Chest 2 View  03/22/2015  CLINICAL DATA:  Feeling weak, headache, dehydrated EXAM: CHEST  2 VIEW COMPARISON:  09/30/2014 FINDINGS: The heart size and mediastinal contours  are within normal limits. Both lungs are clear. The visualized skeletal structures are unremarkable. IMPRESSION: No active cardiopulmonary disease. Electronically Signed   By: Kathreen Devoid   On: 03/22/2015 12:23    Admission HPI: [name] [name] is a 29 year old man with past medical history of Type 1 DM, hyperlipidemia, and history of renal failure requiring HD in 2013 who presents with decreased appetite, blurry vision, and weakness.   He reports feeling his normal self until late Friday (three days ago) where he had decreased appetite that then progressed to decreased PO intake, weakness, fatigue, headache, and blurry vision with unsteady gait. He reports fatigue and weakness yesterday and spent most of the day resting. He has dry mouth with polydipsia  but denies polyuria, or polyphagia. He denies fever, chills, chest pain, dyspnea, cough, wheezing, sinus tenderness, rhinorrhea, sore throat, tooth pain, night sweating, joint pain, rash, LE edema/pain, abdominal pain, nausea, vomiting, change in BM/urination, or change in mental status. He reports unintentional weight loss of 6-7 lb in the past 2-3 months. 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 He does not recall having a diabetic eye exam before. He has received flu shot this year.   His last A1c was 14.6 on 11/05/14. He was diagnosed with Type 1 DM when he was 25 after presenting with polyuria while incarcerated. His father had Type 1 DM as well as his uncle. He has had 8 admissions for DKA since October of 2013 with last admission that required ICU stay in May of this year. He required temporary HD in October of 2013 for acute renal failure of unknown etiology. He reports compliance with taking Novolog 70/30 mix 40 U twice a day and checks his blood sugar 3-4 times a day with average in 250 range. However for the past 2-3 days he has noticed his blood sugars elevated to the 300's. He admits to frequent snacking (chips, sausage, and sugar-free sweets) butdenies recent change in his dietary habits. He denies symptomatic hypoglycemia. He was seen by his PCP on 12/17/14 and was instructed to change to Lantus 15 U daily due to uncontrolled blood sugars of >250 however never did so.   He denies alcohol, tobacco, or drug use. He is currently employed at [employer].   He currently feels much better and denies any complaints  Hospital Course by problem list: Principal Problem:   DKA (diabetic ketoacidoses) ([place]) Active Problems:   HLD (hyperlipidemia)   DKA, type 1 ([place])   DKA Patient rapidly corrected from [place] with CO2 of 13 and was transitioned to insulin protamine 70/30 by hospital day 1 AM. Hgb A1c checked 03/22/15 finding 16.2%. However he was noted to have large CBG variations between 70-300  and reports similar problem controlling his sugar well at home. By day 1 he was asymptomatic but remained at the hospital due to hyperglycemia and education for change to basal-bolus insulin regimen. Metabolic acidosis improved with CO2 18 up from <7. Diabetes coordinator helped educate patient and he was transitioned to lantus 25U qhs and novolog 7U TIDAC. CBG partially improved, from 300s down to 100s on 11/1 although still with large variations as high as 295. The patient also started 61m lisinopril on 11/1 without any acute adverse effects observed. Started kit for insulin pens with novolog lantus and glucose monitoring logbook provided prior to discharge.  Discharge Vitals:   BP 108/61 mmHg  Pulse 87  Temp(Src) 98.2 F (36.8 C) (Oral)  Resp 18  Ht _0  (1.676 m)  Wt 54.1 kg (119 lb 4.3 oz)  BMI 19.26 kg/m2  SpO2 100%  Discharge Labs:  Results for orders placed or performed during the hospital encounter of 03/22/15 (from the past 24 hour(s))  Glucose, capillary     Status: Abnormal   Collection Time: 03/23/15  3:39 PM  Result Value Ref Range   Glucose-Capillary 332 (H) 65 - 99 mg/dL   Comment 1 Notify RN    Comment 2 Document in Chart   Glucose, capillary     Status: Abnormal   Collection Time: 03/23/15  8:45 PM  Result Value Ref Range   Glucose-Capillary 320 (H) 65 - 99 mg/dL  Basic metabolic panel     Status: Abnormal   Collection Time: 03/24/15  5:16 AM  Result Value Ref Range   Sodium 137 135 - 145 mmol/L   Potassium 3.6 3.5 - 5.1 mmol/L   Chloride 109 101 - 111 mmol/L   CO2 18 (L) 22 - 32 mmol/L   Glucose, Bld 295 (H) 65 - 99 mg/dL   BUN 8 6 - 20 mg/dL   Creatinine, Ser 0.69 0.61 - 1.24 mg/dL   Calcium 8.9 8.9 - 10.3 mg/dL   GFR calc non Af Amer >60 >60 mL/min   GFR calc Af Amer >60 >60 mL/min   Anion gap 10 5 - 15  Glucose, capillary     Status: Abnormal   Collection Time: 03/24/15  7:57 AM  Result Value Ref Range   Glucose-Capillary 318 (H) 65 - 99 mg/dL    Glucose, capillary     Status: Abnormal   Collection Time: 03/24/15 11:35 AM  Result Value Ref Range   Glucose-Capillary 238 (H) 65 - 99 mg/dL    Signed: Collier Salina, MD 03/24/2015, 1:24 PM

## 2015-03-24 NOTE — Progress Notes (Signed)
  Subjective: No acute events; slept well overnight; no fevers, dizziness, or nausea. Good p.o. intake, solid stool, urinating normally. Had 3 BG >250 overnight after 70 yesterday AM. Spoke extensively with diabetes educator, decided to switch to basal bolus regimen v. 70/30 at home. Received last dose of 70/30 at 3 am 11/1. Hoping to go to a work shift at 3 pm today.  Objective: Tmax: 37.2 C   HR 85-95   RR 13-18   BP 108-120/61-78   SpO2 100% on RA In: 1200 mL P.O.; 500 mL IV. Out: 1150 mL  General: Comfortable-appearing man resting in bed CV: RRR, no m/r/g Lungs: CTAB, no wheezes or crackles Abd: Soft, non-tender to palpation, BS+ Neuro: A&OX3, attending well to conversation.  BMP: Na 137, K 3.6, CO2 18. Anion Gap 10.  BG since noon 11/1: 313--332--320 before bed; 295, 318 this AM  Assessment and Plan: Mr. [name] [name] is a 29 year old male with a hx of poorly controlled T1DM with 8 prior DKA admissions and hyperlipidemia who was admitted in DKA on 10/31. Clinically, he is much improved, but we want to optimize his outpatient glucose control regimen before discharge. After extended consultation with the diabetes educator, barriers to his home BG management have included his intense fear of becoming hypoglycemic, as well as the expense of the 70/30 and lancets. We will transition him to a more physiologic regimen of basal 25 Lantus and TID preprandial 7 U Novolog, with sliding scale correction, and coordinate help with supplies before discharge. He has a resumed a normal diet, and we would like to see BG in the 150s-200s before discharge. [facility] be required to monitor these changes.  1. Poorly Controlled T1DM -  25 U Lantus qHS + 6 U novolog TID before meals + correction (up from 4 novolog yesterday, given persistent BGs in 300s) -  Follow up with Diabetes educator   2. Hypokalemia: Resolved at 3.6 today. -  D/c [place] con po  3. Mild proteinuria: 94 mg/dL  of protein on UA; ACEI started for renal protection -  Lisinopril 5 mg qd -  Will need outpt Cr and K within week  4. Hyperlipidemia -  Lipitor 20 mg   Diet: Low-carb modified DVT Pphx: Lovenox Code status: Full Dispo: Pending resolution of measurement   This is a Psychologist, occupationalMedical Student Note.  The care of the patient was discussed with Dr. Dimple Caseyice, and the assessment and plan formulated with their assistance.  Please see their attached note for official documentation of the daily encounter.   LOS: 2 days   [name] [name], Med Student 03/24/2015, 8:51 AM

## 2015-03-24 NOTE — Progress Notes (Signed)
Results for [name], [place]ijon (MRN [id number]) as of 03/24/2015 11:46  Ref. Range 03/23/2015 12:00 03/23/2015 15:39 03/23/2015 20:45 03/24/2015 07:57 03/24/2015 11:35  Glucose-Capillary Latest Ref Range: 65-99 mg/dL 409313 (H) 811332 (H) 914320 (H) 318 (H) 238 (H)  Noted that CBGs have continued to be elevated. Recommend increasing Lantus to 40 units daily.  Patient takes 70/30 40 units BID at home.  Equal amount of Lantus for the 80 units of 70/30 would be close to Lantus 45 units. Continue Novolog correction scale as ordered and Novolog 7 units TID with meals. Will continue to monitor blood sugars in hospital. Smith MinceKendra Anamarie Hunn RN BSN CDE

## 2015-03-24 NOTE — Progress Notes (Signed)
Spoke with patient in the room with follow up from diabetes coordinator yesterday. Patient wanting to go home. Seems to be a bit confused on the insulin medications. Will need clear discharge instructions on kinds of insulin and when to take at home. States that he will call [facility] to schedule follow up appointment when he gets his work schedule. Spoke with staff RN to let her know that he will need clear instructions on what to take at home.  Smith MinceKendra Gabriell Casimir RN BSN CDE

## 2015-03-24 NOTE — Progress Notes (Signed)
  Date: 03/24/2015  Patient name: [name] [name]  Medical record number: [id number]  Date of birth: 24-Jun-1985   This patient's plan of care was discussed with the house staff. Please see their note for complete details. I concur with their findings.  DKA improved Will continue to work towards improved control (300s last 24h), improved regeimen Continue ACE-I, will need f/u BMP. Cr and K+ stable so far Continue statin  Ginnie SmartJeffrey C Vinny Taranto, MD 03/24/2015, 11:07 AM

## 2015-03-24 NOTE — Discharge Instructions (Signed)
This insulin plan replaces the previous 70/30 mixed insulin syringes you previously used. There are 2 separate types of insulin you will use:  Lantus (Insulin glargine): 25 units once daily at night time Novolog (insulin aspart): 7 units three times daily before meals  You will need to check your blood sugar each time before meals in order to see how well this is controlling your sugar. If your blood sugar seems to be consistently elevated >200s you can adjust the dose of the INSULIN ASPART at mealtimes by 1-2 units at a time. If your blood sugar seems very low <100 you can try decreasing this dose.  Otherwise just record sugar and bring this information to your follow up appointment next week.  If you have difficulty obtaining your insulin or with following this or your blood sugar becomes extremely uncontrolled, please call health and wellness sooner for additional instructions.

## 2015-03-24 NOTE — Progress Notes (Signed)
03/24/2015 11:37 AM  Patient informed me that the MDs wanted to know his lunch time CBG. Stated that he has to work at [employer] and needs to leave. He stated that he feels better and has daughters to feed and bills to pay. Will inform MD about patient request. Will continue to monitor and assess the patient.   PACCAR Incyanne Hill BSN, RN-BC, [employer] [facility] Phone [phone]

## 2015-03-24 NOTE — Progress Notes (Signed)
Lelon MastAnthony Rudzinski to be D/C'd Home per MD order.  Discussed prescriptions and follow up appointments with the patient. Prescriptions given to patient, medication list explained in detail. Pt verbalized understanding.    Medication List    STOP taking these medications        insulin aspart protamine- aspart (70-30) 100 UNIT/ML injection  Commonly known as:  NOVOLOG MIX 70/30     insulin glargine 100 UNIT/ML injection  Commonly known as:  LANTUS  Replaced by:  Insulin Glargine 100 UNIT/ML Solostar Pen      TAKE these medications        atorvastatin 20 MG tablet  Commonly known as:  LIPITOR  Take 1 tablet (20 mg total) by mouth daily.     insulin aspart 100 UNIT/ML FlexPen  Commonly known as:  NOVOLOG  Inject 8 Units into the skin 3 (three) times daily with meals.     Insulin Glargine 100 UNIT/ML Solostar Pen  Commonly known as:  LANTUS  Inject 25 Units into the skin daily at 10 pm.     Insulin Syringe-Needle U-100 31G X 5/16" 0.5 ML Misc  Commonly known as:  B-D INS SYRINGE 0.5CC/31GX5/16  1 each by Does not apply route 2 (two) times daily.     lisinopril 5 MG tablet  Commonly known as:  PRINIVIL,ZESTRIL  Take 1 tablet (5 mg total) by mouth daily.     terbinafine 1 % cream  Commonly known as:  LAMISIL AT  Apply 1 application topically 2 (two) times daily.     TRUEPLUS LANCETS 28G Misc  1 each by Does not apply route 3 (three) times daily.        Filed Vitals:   03/24/15 0512  BP: 108/61  Pulse: 87  Temp: 98.2 F (36.8 C)  Resp: 18    Skin clean, dry and intact without evidence of skin break down, no evidence of skin tears noted. IV catheter discontinued intact. Site without signs and symptoms of complications. Dressing and pressure applied. Pt denies pain at this time. No complaints noted.  An After Visit Summary was printed and given to the patient. Patient escorted via WC, and D/C home via private auto.  PACCAR Incyanne Hill BSN, RN-BC, [employer] [facility] Phone  [phone]

## 2015-03-24 NOTE — Progress Notes (Signed)
Subjective: Patient transitioned to basal-bolus insulin regimen with 25U last night and 7U at mealtime insulin. CBGs initially elevated but improving-down for 238 before lunch time from 318 earlier in AM. Patient very eager for leaving due to scheduled a work shift by 3pm today.  Objective: Vital signs in last 24 hours: Filed Vitals:   03/23/15 1540 03/23/15 1716 03/23/15 2047 03/24/15 0512  BP: 116/64 120/78 116/69 108/61  Pulse: 95 85 94 87  Temp: 98.4 F (36.9 C) 98.5 F (36.9 C) 98.9 F (37.2 C) 98.2 F (36.8 C)  TempSrc: Oral Oral Oral Oral  Resp: Height:      Weight:   54.1 kg (119 lb 4.3 oz)   SpO2: 100% 100% 100% 100%   Weight change: -2.146 kg (-4 lb 11.7 oz)  Intake/Output Summary (Last 24 hours) at 03/24/15 1351 Last data filed at 03/24/15 1017  Gross per 24 hour  Intake    963 ml  Output    600 ml  Net    363 ml   GENERAL- alert, co-operative, NAD HEENT- PERRL, oral mucosa appears moist CARDIAC- RRR, no murmurs, rubs or gallops. RESP- CTAB, no wheezes or crackles. ABDOMEN- Soft, nontender, no guarding or rebound, normoactive bowel sounds present. EXTREMITIES- pulse 2+, symmetric, no pedal edema. SKIN- Warm, dry, No rash or lesion. PSYCH- Normal mood and affect, appropriate thought content and speech.  Lab Results: Basic Metabolic Panel:  Recent Labs Lab 03/22/15 1318  03/23/15 0507 03/24/15 0516  NA 139  < > 133* 137  K 3.1*  < > 3.2* 3.6  CL 115*  < > 114* 109  CO2 7*  < > 14* 18*  GLUCOSE 259*  < > 172* 295*  BUN 11  < > 10 8  CREATININE 1.30*  < > 0.86 0.69  CALCIUM 8.8*  < > 8.0* 8.9  MG 1.9  --   --   --   PHOS 1.7*  --   --   --   < > = values in this interval not displayed. Liver Function Tests:  Recent Labs Lab 03/22/15 1318  AST 19  ALT 19  ALKPHOS 82  BILITOT 1.6*  PROT 7.2  ALBUMIN 3.9   No results for input(s): LIPASE, AMYLASE in the last 168 hours. No results for input(s): AMMONIA in the last 168  hours. CBC:  Recent Labs Lab 03/22/15 0902 03/22/15 0935 03/23/15 0507  WBC 7.5  --  4.1  HGB 15.6 17.3* 13.0  HCT 44.1 51.0 36.7*  MCV 88.4  --  87.2  PLT 229  --  179   Cardiac Enzymes: No results for input(s): CKTOTAL, CKMB, CKMBINDEX, TROPONINI in the last 168 hours. BNP: No results for input(s): PROBNP in the last 168 hours. D-Dimer: No results for input(s): DDIMER in the last 168 hours. CBG:  Recent Labs Lab 03/23/15 0842 03/23/15 1200 03/23/15 1539 03/23/15 2045 03/24/15 0757 03/24/15 1135  GLUCAP 70 313* 332* 320* 318* 238*   Hemoglobin A1C:  Recent Labs Lab 03/22/15 1318  HGBA1C 16.2*   Fasting Lipid Panel:  Recent Labs Lab 03/22/15 1318  CHOL 332*  HDL 42  LDLCALC UNABLE TO CALCULATE IF TRIGLYCERIDE OVER 400 mg/dL  TRIG 295*  CHOLHDL 7.9   Thyroid Function Tests: No results for input(s): TSH, T4TOTAL, FREET4, T3FREE, THYROIDAB in the last 168 hours. Coagulation:  Recent Labs Lab 03/22/15 1318  LABPROT 14.8  INR 1.14   Anemia Panel: No results for input(s):  VITAMINB12, FOLATE, FERRITIN, TIBC, IRON, RETICCTPCT in the last 168 hours. Urine Drug Screen: Drugs of Abuse     Component Value Date/Time   LABOPIA NONE DETECTED 03/22/2015 1600   COCAINSCRNUR NONE DETECTED 03/22/2015 1600   LABBENZ NONE DETECTED 03/22/2015 1600   AMPHETMU NONE DETECTED 03/22/2015 1600   THCU POSITIVE* 03/22/2015 1600   LABBARB NONE DETECTED 03/22/2015 1600    Alcohol Level:  Recent Labs Lab 03/22/15 1319  ETH <5   Urinalysis:  Recent Labs Lab 03/22/15 0913  COLORURINE YELLOW  LABSPEC 1.021  PHURINE 5.0  GLUCOSEU >1000*  HGBUR SMALL*  BILIRUBINUR NEGATIVE  KETONESUR >80*  PROTEINUR 100*  UROBILINOGEN 0.2  NITRITE NEGATIVE  LEUKOCYTESUR NEGATIVE   Misc. Labs:   Micro Results: Recent Results (from the past 240 hour(s))  MRSA PCR Screening     Status: None   Collection Time: 03/22/15  2:48 PM  Result Value Ref Range Status   MRSA by  PCR NEGATIVE NEGATIVE Final    Comment:        The GeneXpert MRSA Assay (FDA approved for NASAL specimens only), is one component of a comprehensive MRSA colonization surveillance program. It is not intended to diagnose MRSA infection nor to guide or monitor treatment for MRSA infections.    Studies/Results: No results found. Medications: I have reviewed the patient's current medications. Scheduled Meds: . enoxaparin (LOVENOX) injection  40 mg Subcutaneous Q24H  . insulin aspart  0-9 Units Subcutaneous TID WC  . insulin aspart  7 Units Subcutaneous TID WC  . insulin glargine  25 Units Subcutaneous QHS  . lisinopril  5 mg Oral Daily  . potassium chloride  40 mEq Oral Once  . sodium chloride  3 mL Intravenous Q12H   Continuous Infusions:  PRN Meds:.acetaminophen **OR** acetaminophen, ondansetron **OR** ondansetron (ZOFRAN) IV, pneumococcal 23 valent vaccine Assessment/Plan: DKA from chronically uncontrolled T1DM - Patient has repeatedly failed treatment on BID 70/30 insulin. Agrees to undertake change to new plan and expressed understanding after verbally instructed plus written instructions provided. -25 units Lantus daily at bedtime -7 units NovoLog 3 times a day before meals -Sensitive sliding scale insulin -Administer PSV 23 before discharge  Non-oliguric AKI - resolved, Cr and K are stable today and acidosis continues resolving, now started with 5mg  lisinopril without adverse effect, to be followed up outpatient  Hyperlipidemia - lipitor 20 mg daily  Diet: Carb modified DVT ppx: Lovenox FULL CODE  Dispo: Anticipated discharge later today.  The patient does have a current PCP (Dessa PhiJosalyn Funches, MD) and [facility] after discharge.  The patient does not have transportation limitations that hinder transportation to clinic appointments.   LOS: 2 days   Fuller Planhristopher W Kendry Pfarr, MD 03/24/2015, 1:51 PM

## 2015-05-18 IMAGING — CR DG CHEST 1V PORT
1 series · 1 of 1 positions shown · non-contrast
Comparison: 08/13/2014

CLINICAL DATA: Diabetic ketoacidosis.  Headache.

EXAM:
PORTABLE CHEST - 1 VIEW

[AP]
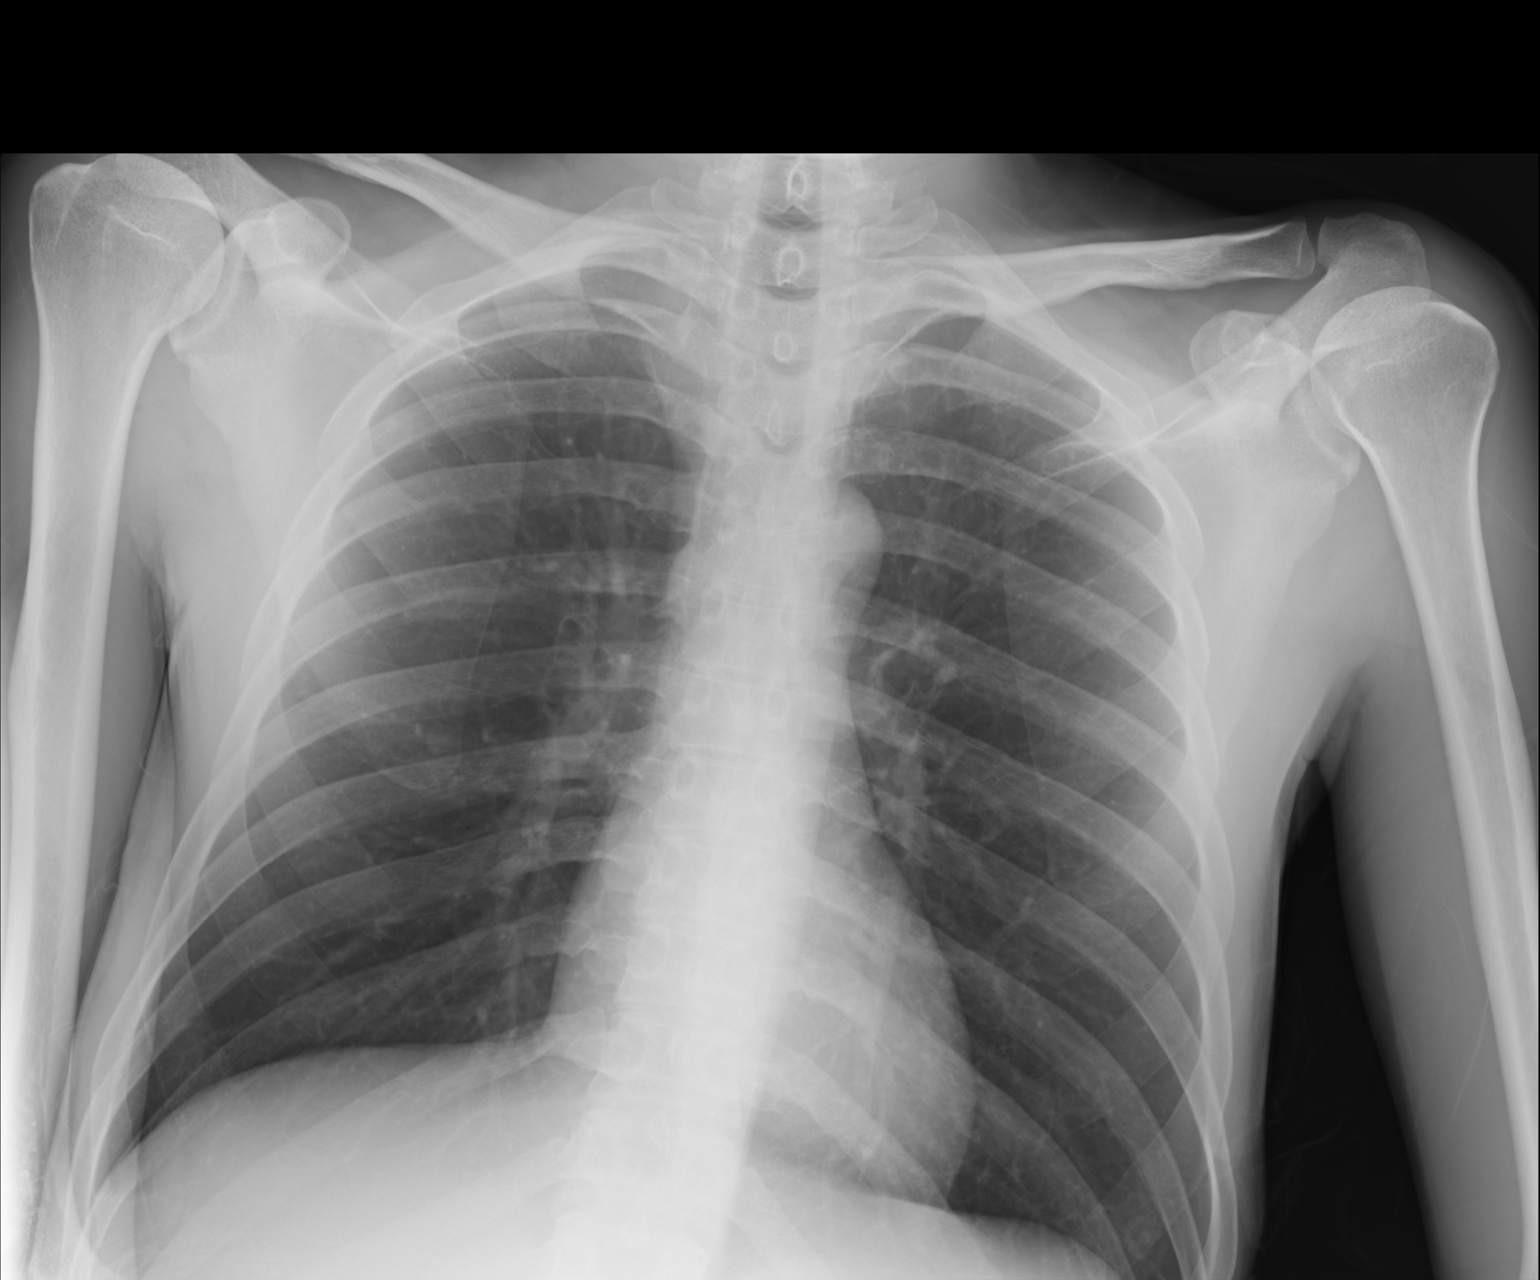

[1 of 1 positions shown; findings below may reference images not displayed]

FINDINGS: The heart size and mediastinal contours are within normal limits.
Both lungs are clear. The visualized skeletal structures are
unremarkable.
IMPRESSION: No active disease.

## 2015-06-01 ENCOUNTER — Emergency Department (HOSPITAL_COMMUNITY)
Admission: EM | Admit: 2015-06-01 | Discharge: 2015-06-01 | Disposition: A | Discharge status: Home / Self Care | Payer: Self-pay | Attending: Emergency Medicine | Admitting: Emergency Medicine

## 2015-06-01 ENCOUNTER — Encounter (HOSPITAL_COMMUNITY): Payer: Self-pay | Admitting: Emergency Medicine

## 2015-06-01 DIAGNOSIS — Z87891 Personal history of nicotine dependence: Secondary | ICD-10-CM | POA: Insufficient documentation

## 2015-06-01 DIAGNOSIS — Z79899 Other long term (current) drug therapy: Secondary | ICD-10-CM | POA: Insufficient documentation

## 2015-06-01 DIAGNOSIS — H109 Unspecified conjunctivitis: Secondary | ICD-10-CM | POA: Insufficient documentation

## 2015-06-01 DIAGNOSIS — Z794 Long term (current) use of insulin: Secondary | ICD-10-CM | POA: Insufficient documentation

## 2015-06-01 DIAGNOSIS — E119 Type 2 diabetes mellitus without complications: Secondary | ICD-10-CM | POA: Insufficient documentation

## 2015-06-01 DIAGNOSIS — Z87448 Personal history of other diseases of urinary system: Secondary | ICD-10-CM | POA: Insufficient documentation

## 2015-06-01 MED ORDER — ERYTHROMYCIN 5 MG/GM OP OINT
1.0000 "application " | TOPICAL_OINTMENT | Freq: Once | OPHTHALMIC | Status: AC
  Administered 2015-06-01: 1 via OPHTHALMIC
  Filled 2015-06-01: qty 3.5

## 2015-06-01 MED ORDER — TETRACAINE HCL 0.5 % OP SOLN
1.0000 [drp] | Freq: Once | OPHTHALMIC | Status: AC
  Administered 2015-06-01: 1 [drp] via OPHTHALMIC
  Filled 2015-06-01: qty 2

## 2015-06-01 MED ORDER — FLUORESCEIN SODIUM 1 MG OP STRP
1.0000 | ORAL_STRIP | Freq: Once | OPHTHALMIC | Status: AC
  Administered 2015-06-01: 1 via OPHTHALMIC
  Filled 2015-06-01: qty 1

## 2015-06-01 NOTE — ED Notes (Signed)
Started  Yesterday with left eye irritation, redness and drainage. No known injury.

## 2015-06-01 NOTE — Discharge Instructions (Signed)

## 2015-06-01 NOTE — ED Provider Notes (Signed)
CSN: [id number]     Arrival date & time 06/01/15  1322 History  By signing my name below, I, Elon Spanner, attest that this documentation has been prepared under the direction and in the presence of [employer], PA-C. Electronically Signed: Elon Spanner ED Scribe. 06/01/2015. 2:12 PM.    Chief Complaint  Patient presents with  . Eye Problem   The history is provided by the patient. No language interpreter was used.   HPI Comments: [name] [name] is a 30 y.o. male who presents to the Emergency Department complaining of left eye redness, clear drainage, and morning matting onset yesterday with sensation of FB.  Patient denies sick contacts with similar symptoms.  Patient does not use contacts or glasses.   Past Medical History  Diagnosis Date  . Diabetes mellitus without complication (HCC) Dx 2012  . Renal insufficiency 2011    pt was on dialysis for a short time approx 2011, pt is no longer on dialysis   History reviewed. No pertinent past surgical history. Family History  Problem Relation Age of Onset  . Diabetes Father   . Diabetes Maternal Grandmother   . Heart disease Neg Hx   . Hypertension Neg Hx    Social History  Substance Use Topics  . Smoking status: Former Smoker    Quit date: 09/19/2013  . Smokeless tobacco: Never Used  . Alcohol Use: No    Review of Systems  Constitutional: Negative for fever.  Eyes: Positive for photophobia, pain, discharge and redness. Negative for itching.      Allergies  Review of patient's allergies indicates no known allergies.  Home Medications   Prior to Admission medications   Medication Sig Start Date End Date Taking? Authorizing Provider  atorvastatin (LIPITOR) 20 MG tablet Take 1 tablet (20 mg total) by mouth daily. Patient not taking: Reported on 02/09/2015 09/03/14   Albertine Grates, MD  insulin aspart (NOVOLOG) 100 UNIT/ML FlexPen Inject 8 Units into the skin 3 (three) times daily with meals. 03/24/15   Fuller Plan, MD   Insulin Glargine (LANTUS) 100 UNIT/ML Solostar Pen Inject 25 Units into the skin daily at 10 pm. 03/24/15   Fuller Plan, MD  Insulin Syringe-Needle U-100 (B-D INS SYRINGE 0.5CC/31GX5/16) 31G X 5/16" 0.5 ML MISC 1 each by Does not apply route 2 (two) times daily. 10/09/14   Josalyn Funches, MD  lisinopril (PRINIVIL,ZESTRIL) 5 MG tablet Take 1 tablet (5 mg total) by mouth daily. 03/24/15   Fuller Plan, MD  terbinafine (LAMISIL AT) 1 % cream Apply 1 application topically 2 (two) times daily. 12/17/14   Dessa Phi, MD  TRUEPLUS LANCETS 28G MISC 1 each by Does not apply route 3 (three) times daily. 05/04/14   Josalyn Funches, MD   BP 121/68 mmHg  Pulse 101  Temp(Src) 98.1 F (36.7 C) (Oral)  Resp 18  Ht 5\' 6"  (1.676 m)  Wt 54.885 kg  BMI 19.54 kg/m2  SpO2 100% Physical Exam  Constitutional: He is oriented to person, place, and time. He appears well-developed and well-nourished. No distress.  HENT:  Head: Normocephalic and atraumatic.  Eyes: EOM are normal. Pupils are equal, round, and reactive to light.  EOMs intact.  PERRL.  Injected left eye with active watering.     Neck: Neck supple. No tracheal deviation present.  Cardiovascular: Normal rate.   Pulmonary/Chest: Effort normal. No respiratory distress.  Musculoskeletal: Normal range of motion.  Neurological: He is alert and oriented to person, place, and time.  Skin: Skin is warm and dry.  Psychiatric: He has a normal mood and affect. His behavior is normal.  Nursing note and vitals reviewed.   ED Course  Procedures (including critical care time)  DIAGNOSTIC STUDIES: Oxygen Saturation is 100% on RA, normal by my interpretation.    COORDINATION OF CARE:  3:24 PM Discussed treatment plan with patient at bedside.  Patient acknowledges and agrees with plan.    WOODS LAMP EXAM: Tetracaine drops used.  Lids everted and swept for exam, no evidence of foreign body.  Conjunctivae: Injection Cornea: No evidence of  abrasion.  EOM: Intact  Pupils: PERRL  Fluorescein uptake: non  Patient tolerated procedure well without immediate complications.   Labs Review Labs Reviewed - No data to display  Imaging Review No results found.   EKG Interpretation None       Visual Acuity  Right Eye Distance: 20/25 without corrective lens Left Eye Distance: 20/100 without corrective lens Bilateral Distance: 20/25 without corrective lens  Right Eye Near:   Left Eye Near:    Bilateral Near:     MDM   Final diagnoses:  Conjunctivitis of left eye   Pt dx likely bacterial conjunctivitis based on presentation & eye exam.  No evidence of HSV or VSV infection. Pt is not a contact lens wearer.  Exam non-concerning for orbital cellulitis, hyphema, corneal ulcers, corneal abrasions or trauma.  Patient will be discharged home with erythromycin every 6 hours for conjunctivitis.  Patient has been instructed to use cool compresses and practice personal hygiene with frequent hand washing.  Patient understands to follow up with ophthalmology, especially if new symptoms including change in vision, purulent drainage, or entrapment occur.    I personally performed the services described in this documentation, which was scribed in my presence. The recorded information has been reviewed and is accurate.    Catha GosselinHanna Patel-Mills, PA-C 06/01/15 1643  Azalia BilisKevin Campos, MD 06/02/15 [phone]

## 2015-06-05 ENCOUNTER — Encounter (HOSPITAL_COMMUNITY): Payer: Self-pay

## 2015-06-05 ENCOUNTER — Emergency Department (HOSPITAL_COMMUNITY): Payer: Self-pay

## 2015-06-05 ENCOUNTER — Inpatient Hospital Stay (HOSPITAL_COMMUNITY)
Admission: EM | Admit: 2015-06-05 | Discharge: 2015-06-07 | DRG: 637 | Disposition: A | Discharge status: Home / Self Care | Payer: Self-pay | Attending: Internal Medicine | Admitting: Internal Medicine

## 2015-06-05 DIAGNOSIS — Z681 Body mass index (BMI) 19 or less, adult: Secondary | ICD-10-CM

## 2015-06-05 DIAGNOSIS — E1110 Type 2 diabetes mellitus with ketoacidosis without coma: Secondary | ICD-10-CM

## 2015-06-05 DIAGNOSIS — Z833 Family history of diabetes mellitus: Secondary | ICD-10-CM

## 2015-06-05 DIAGNOSIS — E1010 Type 1 diabetes mellitus with ketoacidosis without coma: Principal | ICD-10-CM | POA: Diagnosis present

## 2015-06-05 DIAGNOSIS — Z794 Long term (current) use of insulin: Secondary | ICD-10-CM

## 2015-06-05 DIAGNOSIS — E1310 Other specified diabetes mellitus with ketoacidosis without coma: Secondary | ICD-10-CM

## 2015-06-05 DIAGNOSIS — N179 Acute kidney failure, unspecified: Secondary | ICD-10-CM | POA: Diagnosis present

## 2015-06-05 DIAGNOSIS — E875 Hyperkalemia: Secondary | ICD-10-CM | POA: Diagnosis present

## 2015-06-05 DIAGNOSIS — E43 Unspecified severe protein-calorie malnutrition: Secondary | ICD-10-CM | POA: Diagnosis present

## 2015-06-05 DIAGNOSIS — Z87891 Personal history of nicotine dependence: Secondary | ICD-10-CM

## 2015-06-05 LAB — CBC
HEMATOCRIT: 48.6 % (ref 39.0–52.0)
Hemoglobin: 16.2 g/dL (ref 13.0–17.0)
MCH: 31.3 pg (ref 26.0–34.0)
MCHC: 33.3 g/dL (ref 30.0–36.0)
MCV: 93.8 fL (ref 78.0–100.0)
PLATELETS: 365 10*3/uL (ref 150–400)
RBC: 5.18 MIL/uL (ref 4.22–5.81)
RDW: 13.1 % (ref 11.5–15.5)
WBC: 14.6 10*3/uL — AB (ref 4.0–10.5)

## 2015-06-05 LAB — CBG MONITORING, ED: GLUCOSE-CAPILLARY: 498 mg/dL — AB (ref 65–99)

## 2015-06-05 NOTE — ED Notes (Signed)
Pt here with c/o SOB, weakness and thinks he feels very dehydrated. Denies pain. Hx of type 1 DM. CBG-498.

## 2015-06-05 NOTE — ED Notes (Signed)
Patient in xray 

## 2015-06-05 NOTE — ED Provider Notes (Signed)
CSN: [id number]     Arrival date & time 06/05/15  2301 History   By signing my name below, I, [name] [name], attest that this documentation has been prepared under the direction and in the presence of [name] Rhineonald Demaurion Dicioccio, MD.  Electronically Signed: Arlan OrganAshley [name], ED Scribe. 06/05/2015. 12:06 AM.   Chief Complaint  Patient presents with  . Shortness of Breath  . Hyperglycemia   The history is provided by the patient. No language interpreter was used.    HPI Comments: [name] [name] is a 30 y.o. male with a PMHx of Type 1 DM and renal insufficiency who presents to the Emergency Department here for possible hyperglycemia this evening. Pt reports constant, ongoing shortness of breath, generalized weakness, nausea, vomiting, and mild HA x 1 day. No aggravating or alleviating factors at this time. Pt states current symptoms are consistent with previous DKA episodes. [name] states he took his insulin this evening but admits he only took a small amount as he did not eat a full meal. No recent fever, chills, chest pain, or abdominal pain. CBG 498 today.  PCP: [name] PaulaFUNCHES, JOSALYN C, MD   Past Medical History  Diagnosis Date  . Diabetes mellitus without complication (HCC) Dx 2012  . Renal insufficiency 2011    pt was on dialysis for a short time approx 2011, pt is no longer on dialysis   History reviewed. No pertinent past surgical history. Family History  Problem Relation Age of Onset  . Diabetes Father   . Diabetes Maternal Grandmother   . Heart disease Neg Hx   . Hypertension Neg Hx    Social History  Substance Use Topics  . Smoking status: Former Smoker    Quit date: 09/19/2013  . Smokeless tobacco: Never Used  . Alcohol Use: No    Review of Systems  Constitutional: Negative for fever and chills.  Respiratory: Positive for shortness of breath. Negative for cough.   Cardiovascular: Negative for chest pain.  Gastrointestinal: Positive for nausea and vomiting. Negative for abdominal pain.   Neurological: Positive for weakness and headaches.  Psychiatric/Behavioral: Negative for confusion.  All other systems reviewed and are negative.     Allergies  Review of patient's allergies indicates no known allergies.  Home Medications   Prior to Admission medications   Medication Sig Start Date End Date Taking? Authorizing Provider  atorvastatin (LIPITOR) 20 MG tablet Take 1 tablet (20 mg total) by mouth daily. Patient not taking: Reported on 02/09/2015 09/03/14   Albertine GratesFang Xu, MD  insulin aspart (NOVOLOG) 100 UNIT/ML FlexPen Inject 8 Units into the skin 3 (three) times daily with meals. 03/24/15   Fuller Planhristopher W Rice, MD  Insulin Glargine (LANTUS) 100 UNIT/ML Solostar Pen Inject 25 Units into the skin daily at 10 pm. 03/24/15   Fuller Planhristopher W Rice, MD  Insulin Syringe-Needle U-100 (B-D INS SYRINGE 0.5CC/31GX5/16) 31G X 5/16" 0.5 ML MISC 1 each by Does not apply route 2 (two) times daily. 10/09/14   Josalyn Funches, MD  lisinopril (PRINIVIL,ZESTRIL) 5 MG tablet Take 1 tablet (5 mg total) by mouth daily. 03/24/15   Fuller Planhristopher W Rice, MD  terbinafine (LAMISIL AT) 1 % cream Apply 1 application topically 2 (two) times daily. 12/17/14   Dessa PhiJosalyn Funches, MD  TRUEPLUS LANCETS 28G MISC 1 each by Does not apply route 3 (three) times daily. 05/04/14   Dessa PhiJosalyn Funches, MD   Triage Vitals: BP 143/94 mmHg  Pulse 133  Temp(Src) 98.9 F (37.2 [name]) (Oral)  Resp 23  Ht 5'  6" (1.676 m)  SpO2 100%   Physical Exam  CONSTITUTIONAL: Well developed/well nourished. Smells ketotic  HEAD: Normocephalic/atraumatic EYES: EOMI/PERRL ENMT: Mucous membranes dry NECK: supple no meningeal signs SPINE/BACK:entire spine nontender CV: S1/S2 noted, no murmurs/rubs/gallops noted,tachycardic LUNGS: Lungs are clear to auscultation bilaterally, appears tachypneic ABDOMEN: soft, nontender, no rebound or guarding, bowel sounds noted throughout abdomen GU:no cva tenderness NEURO: Pt is awake/alert/appropriate, moves all  extremitiesx4.   EXTREMITIES: pulses normal/equal, full ROM SKIN: warm, color normal PSYCH: no abnormalities of mood noted, alert and oriented to situation   ED Course  Procedures  Medications  insulin regular (NOVOLIN R,HUMULIN R) 250 Units in sodium chloride 0.9 % 250 mL (1 Units/mL) infusion (3.7 Units/hr Intravenous New Bag/Given 06/06/15 0049)  sodium chloride 0.9 % bolus 1,000 mL (0 mLs Intravenous Stopped 06/06/15 0131)  sodium chloride 0.9 % bolus 1,000 mL (1,000 mLs Intravenous New Bag/Given 06/06/15 0131)    DIAGNOSTIC STUDIES: Oxygen Saturation is 100% on RA, Normal by my interpretation.    COORDINATION OF CARE: 12:05 AM- Will give fluids, and insuline. Will order CXR, BMP, CBC, urinalysis, CBG, and EKG. Discussed treatment plan with pt at bedside and pt agreed to plan.     CRITICAL CARE Performed by: [name] Rhine, MD  Total critical care time: 40 minutes Critical care time was exclusive of separately billable procedures and treating other patients. Critical care was necessary to treat or prevent imminent or life-threatening deterioration. Critical care was time spent personally by me on the following activities: development of treatment plan with patient and/or surrogate as well as nursing, discussions with consultants, evaluation of patient's response to treatment, examination of patient, obtaining history from patient or surrogate, ordering and performing treatments and interventions, ordering and review of laboratory studies, ordering and review of radiographic studies, pulse oximetry and re-evaluation of patient's condition.  PATIENT IS IN DKA, HE REQUIRES IV FLUIDS, HE IS ON INSULIN DRIP  Labs Review Labs Reviewed  BASIC METABOLIC PANEL - Abnormal; Notable for the following:    Sodium 134 (*)    Potassium 5.3 (*)    Chloride 99 (*)    CO2 <7 (*)    Glucose, Bld 500 (*)    BUN 21 (*)    Creatinine, Ser 1.92 (*)    GFR calc non Af Amer 45 (*)    GFR calc Af Amer  52 (*)    All other components within normal limits  CBC - Abnormal; Notable for the following:    WBC 14.6 (*)    All other components within normal limits  URINALYSIS, ROUTINE W REFLEX MICROSCOPIC (NOT AT [facility]) - Abnormal; Notable for the following:    Glucose, UA >1000 (*)    Hgb urine dipstick TRACE (*)    Ketones, ur >80 (*)    Protein, ur 30 (*)    All other components within normal limits  URINE MICROSCOPIC-ADD ON - Abnormal; Notable for the following:    Bacteria, UA RARE (*)    Casts GRANULAR CAST (*)    All other components within normal limits  CBG MONITORING, ED - Abnormal; Notable for the following:    Glucose-Capillary 498 (*)    All other components within normal limits  CBG MONITORING, ED - Abnormal; Notable for the following:    Glucose-Capillary 425 (*)    All other components within normal limits    Imaging Review Dg Chest 2 View  06/05/2015  CLINICAL DATA:  Acute onset of shortness of breath and generalized weakness.  Initial encounter. EXAM: CHEST  2 VIEW COMPARISON:  Chest radiograph performed 03/22/2015 FINDINGS: The lungs are well-aerated and clear. There is no evidence of focal opacification, pleural effusion or pneumothorax. The cardiomediastinal silhouette is within normal limits. No acute osseous abnormalities are seen. IMPRESSION: No acute cardiopulmonary process seen. Electronically Signed   By: Roanna Raider M.D.   On: 06/05/2015 23:52   I have personally reviewed and evaluated these images and lab results as part of my medical decision-making.   EKG Interpretation   Date/Time:  Saturday June 05 2015 23:14:24 EST Ventricular Rate:  127 PR Interval:  134 QRS Duration: 72 QT Interval:  322 QTC Calculation: 467 R Axis:   85 Text Interpretation:  Sinus tachycardia Right atrial enlargement Septal  infarct , age undetermined Abnormal ECG Confirmed by Bebe Shaggy  MD, Jaber Dunlow  [phone]) on 06/06/2015 12:07:13 AM     1:51 AM PT IS IN DKA WITH BICARB  LESS THAN 7 HE FEELS IMPROVED WITH IV FLUIDS HE IS STILL TACHYCARDIC AND MILDLY TACHYPNEIC D/W DR OPYD, WILL ADMIT TO STEPDOWN UNIT BP 154/91 mmHg  Pulse 128  Temp(Src) 98.9 F (37.2 [name]) (Oral)  Resp 27  Ht 5\' 6"  (1.676 m)  SpO2 100%\ MDM   Final diagnoses:  Diabetic ketoacidosis without coma associated with other specified diabetes mellitus (HCC)    Nursing notes including past medical history and social history reviewed and considered in documentation xrays/imaging reviewed by myself and considered during evaluation Labs/vital reviewed myself and considered during evaluation   I personally performed the services described in this documentation, which was scribed in my presence. The recorded information has been reviewed and is accurate.       [name] Rhine, MD 06/06/15 [phone]

## 2015-06-06 ENCOUNTER — Encounter (HOSPITAL_COMMUNITY): Payer: Self-pay | Admitting: Family Medicine

## 2015-06-06 DIAGNOSIS — R000 Tachycardia, unspecified: Secondary | ICD-10-CM | POA: Insufficient documentation

## 2015-06-06 DIAGNOSIS — E1010 Type 1 diabetes mellitus with ketoacidosis without coma: Principal | ICD-10-CM

## 2015-06-06 DIAGNOSIS — R0682 Tachypnea, not elsewhere classified: Secondary | ICD-10-CM

## 2015-06-06 DIAGNOSIS — E43 Unspecified severe protein-calorie malnutrition: Secondary | ICD-10-CM

## 2015-06-06 DIAGNOSIS — R6510 Systemic inflammatory response syndrome (SIRS) of non-infectious origin without acute organ dysfunction: Secondary | ICD-10-CM | POA: Insufficient documentation

## 2015-06-06 DIAGNOSIS — D72829 Elevated white blood cell count, unspecified: Secondary | ICD-10-CM | POA: Insufficient documentation

## 2015-06-06 LAB — CBG MONITORING, ED
GLUCOSE-CAPILLARY: 360 mg/dL — AB (ref 65–99)
GLUCOSE-CAPILLARY: 333 mg/dL — AB (ref 65–99)
GLUCOSE-CAPILLARY: 306 mg/dL — AB (ref 65–99)
GLUCOSE-CAPILLARY: 245 mg/dL — AB (ref 65–99)
GLUCOSE-CAPILLARY: 201 mg/dL — AB (ref 65–99)
GLUCOSE-CAPILLARY: 198 mg/dL — AB (ref 65–99)
GLUCOSE-CAPILLARY: 425 mg/dL — AB (ref 65–99)
Glucose-Capillary: 186 mg/dL — ABNORMAL HIGH (ref 65–99)
Glucose-Capillary: 170 mg/dL — ABNORMAL HIGH (ref 65–99)
Glucose-Capillary: 167 mg/dL — ABNORMAL HIGH (ref 65–99)
Glucose-Capillary: 134 mg/dL — ABNORMAL HIGH (ref 65–99)

## 2015-06-06 LAB — BASIC METABOLIC PANEL
ANION GAP: 18 — AB (ref 5–15)
ANION GAP: 12 (ref 5–15)
BUN: 21 mg/dL — ABNORMAL HIGH (ref 6–20)
BUN: 21 mg/dL — ABNORMAL HIGH (ref 6–20)
BUN: 18 mg/dL (ref 6–20)
BUN: 14 mg/dL (ref 6–20)
CALCIUM: 9.3 mg/dL (ref 8.9–10.3)
CALCIUM: 8.4 mg/dL — AB (ref 8.9–10.3)
CALCIUM: 7.9 mg/dL — AB (ref 8.9–10.3)
CALCIUM: 7.8 mg/dL — AB (ref 8.9–10.3)
CO2: <7 mmol/L — ABNORMAL LOW (ref 22–32)
CO2: <7 mmol/L — ABNORMAL LOW (ref 22–32)
CO2: 7 mmol/L — ABNORMAL LOW (ref 22–32)
CO2: 14 mmol/L — ABNORMAL LOW (ref 22–32)
Chloride: 99 mmol/L — ABNORMAL LOW (ref 101–111)
Chloride: 111 mmol/L (ref 101–111)
Chloride: 114 mmol/L — ABNORMAL HIGH (ref 101–111)
Chloride: 111 mmol/L (ref 101–111)
Creatinine, Ser: 1.92 mg/dL — ABNORMAL HIGH (ref 0.61–1.24)
Creatinine, Ser: 1.66 mg/dL — ABNORMAL HIGH (ref 0.61–1.24)
Creatinine, Ser: 1.45 mg/dL — ABNORMAL HIGH (ref 0.61–1.24)
Creatinine, Ser: 0.85 mg/dL (ref 0.61–1.24)
GFR calc Af Amer: >60 mL/min (ref >60)
GFR calc Af Amer: >60 mL/min (ref >60)
GFR, EST AFRICAN AMERICAN: 52 mL/min — AB (ref >60)
GFR, EST NON AFRICAN AMERICAN: 45 mL/min — AB (ref >60)
GFR, EST NON AFRICAN AMERICAN: 54 mL/min — AB (ref >60)
GLUCOSE: 500 mg/dL — AB (ref 65–99)
GLUCOSE: 366 mg/dL — AB (ref 65–99)
GLUCOSE: 234 mg/dL — AB (ref 65–99)
GLUCOSE: 203 mg/dL — AB (ref 65–99)
Potassium: 5.3 mmol/L — ABNORMAL HIGH (ref 3.5–5.1)
Potassium: 4.9 mmol/L (ref 3.5–5.1)
Potassium: 4.1 mmol/L (ref 3.5–5.1)
Potassium: 3.4 mmol/L — ABNORMAL LOW (ref 3.5–5.1)
SODIUM: 141 mmol/L (ref 135–145)
SODIUM: 139 mmol/L (ref 135–145)
SODIUM: 137 mmol/L (ref 135–145)
Sodium: 134 mmol/L — ABNORMAL LOW (ref 135–145)

## 2015-06-06 LAB — RAPID URINE DRUG SCREEN, HOSP PERFORMED
Amphetamines: NOT DETECTED
BENZODIAZEPINES: NOT DETECTED
Barbiturates: NOT DETECTED
Cocaine: NOT DETECTED
Opiates: NOT DETECTED
Tetrahydrocannabinol: NOT DETECTED

## 2015-06-06 LAB — GLUCOSE, CAPILLARY
GLUCOSE-CAPILLARY: 170 mg/dL — AB (ref 65–99)
GLUCOSE-CAPILLARY: 261 mg/dL — AB (ref 65–99)
Glucose-Capillary: 164 mg/dL — ABNORMAL HIGH (ref 65–99)

## 2015-06-06 LAB — URINALYSIS, ROUTINE W REFLEX MICROSCOPIC
Bilirubin Urine: NEGATIVE
Glucose, UA: >1000 mg/dL — AB
LEUKOCYTES UA: NEGATIVE
NITRITE: NEGATIVE
PROTEIN: 30 mg/dL — AB
Specific Gravity, Urine: 1.021 (ref 1.005–1.030)
pH: 5 (ref 5.0–8.0)

## 2015-06-06 LAB — PHOSPHORUS: PHOSPHORUS: 4.9 mg/dL — AB (ref 2.5–4.6)

## 2015-06-06 LAB — URINE MICROSCOPIC-ADD ON
Squamous Epithelial / LPF: NONE SEEN
WBC UA: NONE SEEN WBC/hpf (ref 0–5)

## 2015-06-06 LAB — MAGNESIUM: MAGNESIUM: 2 mg/dL (ref 1.7–2.4)

## 2015-06-06 LAB — BETA-HYDROXYBUTYRIC ACID

## 2015-06-06 MED ORDER — HEPARIN SODIUM (PORCINE) 5000 UNIT/ML IJ SOLN
5000.0000 [IU] | Freq: Three times a day (TID) | INTRAMUSCULAR | Status: DC
  Administered 2015-06-06 – 2015-06-07 (×4): 5000 [IU] via SUBCUTANEOUS
  Filled 2015-06-06 (×3): qty 1

## 2015-06-06 MED ORDER — INSULIN ASPART 100 UNIT/ML ~~LOC~~ SOLN: 0.0000 [IU] | Freq: Every day | SUBCUTANEOUS | Status: DC

## 2015-06-06 MED ORDER — ALBUTEROL SULFATE (2.5 MG/3ML) 0.083% IN NEBU
5.0000 mg | INHALATION_SOLUTION | Freq: Once | RESPIRATORY_TRACT | Status: DC
  Filled 2015-06-06: qty 6

## 2015-06-06 MED ORDER — SODIUM CHLORIDE 0.9 % IV BOLUS (SEPSIS)
1000.0000 mL | Freq: Once | INTRAVENOUS | Status: AC
  Administered 2015-06-06: 1000 mL via INTRAVENOUS

## 2015-06-06 MED ORDER — INSULIN ASPART 100 UNIT/ML ~~LOC~~ SOLN
0.0000 [IU] | Freq: Three times a day (TID) | SUBCUTANEOUS | Status: DC
  Administered 2015-06-07: 3 [IU] via SUBCUTANEOUS
  Administered 2015-06-07: 5 [IU] via SUBCUTANEOUS

## 2015-06-06 MED ORDER — SODIUM CHLORIDE 0.9 % IV SOLN
INTRAVENOUS | Status: DC
  Administered 2015-06-06: 03:00:00 via INTRAVENOUS

## 2015-06-06 MED ORDER — INSULIN REGULAR HUMAN 100 UNIT/ML IJ SOLN
INTRAMUSCULAR | Status: DC
  Administered 2015-06-06: 3.7 [IU]/h via INTRAVENOUS
  Filled 2015-06-06: qty 2.5

## 2015-06-06 MED ORDER — DEXTROSE-NACL 5-0.45 % IV SOLN
INTRAVENOUS | Status: DC
  Administered 2015-06-06: 06:00:00 via INTRAVENOUS

## 2015-06-06 MED ORDER — POTASSIUM CHLORIDE CRYS ER 20 MEQ PO TBCR
40.0000 meq | EXTENDED_RELEASE_TABLET | Freq: Once | ORAL | Status: AC
  Administered 2015-06-06: 40 meq via ORAL
  Filled 2015-06-06: qty 2

## 2015-06-06 MED ORDER — SODIUM CHLORIDE 0.9 % IV SOLN
INTRAVENOUS | Status: DC
  Filled 2015-06-06: qty 2.5

## 2015-06-06 MED ORDER — INSULIN GLARGINE 100 UNIT/ML ~~LOC~~ SOLN
25.0000 [IU] | Freq: Every day | SUBCUTANEOUS | Status: DC
  Administered 2015-06-06: 25 [IU] via SUBCUTANEOUS
  Filled 2015-06-06 (×2): qty 0.25

## 2015-06-06 MED ORDER — INSULIN ASPART 100 UNIT/ML ~~LOC~~ SOLN
8.0000 [IU] | Freq: Three times a day (TID) | SUBCUTANEOUS | Status: DC
  Administered 2015-06-06 – 2015-06-07 (×3): 8 [IU] via SUBCUTANEOUS
  Filled 2015-06-06: qty 1

## 2015-06-06 NOTE — ED Notes (Signed)
cbg is 333.

## 2015-06-06 NOTE — ED Notes (Signed)
Paged admitting doctor for new orders for medsurg admission.   1438-Dr. Benjamine MolaVann to put in new medsurg order.

## 2015-06-06 NOTE — ED Notes (Addendum)
CBG was 425, Rn notified.

## 2015-06-06 NOTE — ED Notes (Signed)
Phlebotomy at the bedside  

## 2015-06-06 NOTE — ED Notes (Signed)
cbg is 245

## 2015-06-06 NOTE — ED Notes (Signed)
Spoke with Dr. Benjamine MolaVann, made aware has had CBG 186. Will recheck bmp at 10 am then reevaluate.

## 2015-06-06 NOTE — H&P (Signed)
[facility] History and Physical  Milo Schreier ZOX:[id number] DOB: 06-05-1985 DOA: 06/05/2015  Referring physician: ED physician PCP: [name], MD  Specialists:   Chief Complaint:  Generalized malaise, hyperglycemia  HPI: [name] [name] is a 30 y.o. male with PMH of poorly controlled type 1 diabetes who presents to the ED with generalize weakness, malaise, and nausea/vomiting of one days duration. Patient reports that he had been in his usual state of health the day prior to his admission, but developed the aforementioned symptoms early today. He denies any recent fever, chills, cough, or dysuria and denies any use of alcohol or illicit substances. He reports adherence to his prescribed insulin regimen while acknowledging that he did not take his full basal dose Last night because he did not eat much. He checked his sugar after developing the symptoms and found it to be high. He was unable to control the hyperglycemia at home and came into the ED.  In ED, patient was found to be afebrile, saturating well on room air and normotensive, but with tachycardia and tachypnea. There was no acute findings on chest x-ray and EKG feature to sinus tachycardia with no acute ischemic changes. Initial blood work returned with profound electrolyte derangements and acute renal insufficiency. Patient's serum glucose was 500 and bicarbonate less than 7. Patient was bolused 2 L of normal saline in the emergency department and started on a glucose stabilizer. He remained tachycardic but hemodynamically stable in the emergency department and was admitted to the hospital for ongoing evaluation and management of DKA.   Where does patient live?   At home     Can patient participate in ADLs?  Yes      Review of Systems:   General: no fevers, chills, sweats, weight change, or poor appetite  HEENT: no blurry vision, hearing changes or sore throat Pulm: no dyspnea, cough, or wheeze CV: no chest pain or  palpitations Abd:  Nausea and vomiting x1 day, no abdominal pain, diarrhea, or constipation GU: no dysuria, hematuria, increased urinary frequency, or urgency  Ext: no leg edema Neuro: no focal weakness, numbness, or tingling, no vision change or hearing loss. Generalized weakness x1 day Skin: no rash, no wounds MSK: No muscle spasm, no deformity, no red, hot, or swollen joint Heme: No easy bruising or bleeding Travel history: No recent long distant travel    Allergy: No Known Allergies  Past Medical History  Diagnosis Date  . Diabetes mellitus without complication (HCC) Dx 2012  . Renal insufficiency 2011    pt was on dialysis for a short time approx 2011, pt is no longer on dialysis    History reviewed. No pertinent past surgical history.  Social History:  reports that he quit smoking about 20 months ago. He has never used smokeless tobacco. He reports that he does not drink alcohol or use illicit drugs.  Family History:  Family History  Problem Relation Age of Onset  . Diabetes Father   . Diabetes Maternal Grandmother   . Heart disease Neg Hx   . Hypertension Neg Hx      Prior to Admission medications   Medication Sig Start Date End Date Taking? Authorizing Provider  atorvastatin (LIPITOR) 20 MG tablet Take 1 tablet (20 mg total) by mouth daily. Patient not taking: Reported on 02/09/2015 09/03/14   Albertine Grates, MD  insulin aspart (NOVOLOG) 100 UNIT/ML FlexPen Inject 8 Units into the skin 3 (three) times daily with meals. 03/24/15   Fuller Plan, MD  Insulin Glargine (LANTUS) 100 UNIT/ML Solostar Pen Inject 25 Units into the skin daily at 10 pm. 03/24/15   Fuller Plan, MD  Insulin Syringe-Needle U-100 (B-D INS SYRINGE 0.5CC/31GX5/16) 31G X 5/16" 0.5 ML MISC 1 each by Does not apply route 2 (two) times daily. 10/09/14   Josalyn Funches, MD  lisinopril (PRINIVIL,ZESTRIL) 5 MG tablet Take 1 tablet (5 mg total) by mouth daily. 03/24/15   Fuller Plan, MD  terbinafine  (LAMISIL AT) 1 % cream Apply 1 application topically 2 (two) times daily. 12/17/14   Dessa Phi, MD  TRUEPLUS LANCETS 28G MISC 1 each by Does not apply route 3 (three) times daily. 05/04/14   Dessa Phi, MD    Physical Exam: Filed Vitals:   06/06/15 0030 06/06/15 0045 06/06/15 0047 06/06/15 0115  BP: 149/92 152/90 152/90 154/91  Pulse: 130 132 131 128  Temp:      TempSrc:      Resp: 29 27 28 27   Height:      SpO2: 100% 100% 100% 100%   General: In moderate respiratory distress with tachypnea HEENT:       Eyes: PERRL, EOMI, no scleral icterus or conjunctival pallor.       ENT: No discharge from the ears or nose, no pharyngeal ulcers, oral mucosa dry.        Neck: No JVD, no bruit, no appreciable mass Heme: No cervical adenopathy, no pallor Cardiac: Rate ~120 with hyperdynamic precordium. No murmurs, No gallops or rubs. Pulm: Good air movement bilaterally. No rales, wheezing, rhonchi or rubs. Abd: Soft, nondistended, nontender, no rebound pain or gaurding, no mass or organomegaly, BS present. Ext: No LE edema bilaterally. 2+DP/PT pulse bilaterally. Musculoskeletal: No gross deformity, no red, hot, swollen joints, no limitation in ROM  Skin: No rashes or wounds on exposed surfaces  Neuro: Alert, oriented X3, cranial nerves II-XII grossly intact. No focal findings Psych: Patient is not overtly psychotic, mood and affect appropriate.  Labs on Admission:  Basic Metabolic Panel:  Recent Labs Lab 06/05/15 2312  NA 134*  K 5.3*  CL 99*  CO2 <7*  GLUCOSE 500*  BUN 21*  CREATININE 1.92*  CALCIUM 9.3   Liver Function Tests: No results for input(s): AST, ALT, ALKPHOS, BILITOT, PROT, ALBUMIN in the last 168 hours. No results for input(s): LIPASE, AMYLASE in the last 168 hours. No results for input(s): AMMONIA in the last 168 hours. CBC:  Recent Labs Lab 06/05/15 2312  WBC 14.6*  HGB 16.2  HCT 48.6  MCV 93.8  PLT 365   Cardiac Enzymes: No results for input(s):  CKTOTAL, CKMB, CKMBINDEX, TROPONINI in the last 168 hours.  BNP (last 3 results) No results for input(s): BNP in the last 8760 hours.  ProBNP (last 3 results) No results for input(s): PROBNP in the last 8760 hours.  CBG:  Recent Labs Lab 06/05/15 2311 06/06/15 0046 06/06/15 0153  GLUCAP 498* 425* 360*    Radiological Exams on Admission: Dg Chest 2 View  06/05/2015  CLINICAL DATA:  Acute onset of shortness of breath and generalized weakness. Initial encounter. EXAM: CHEST  2 VIEW COMPARISON:  Chest radiograph performed 03/22/2015 FINDINGS: The lungs are well-aerated and clear. There is no evidence of focal opacification, pleural effusion or pneumothorax. The cardiomediastinal silhouette is within normal limits. No acute osseous abnormalities are seen. IMPRESSION: No acute cardiopulmonary process seen. Electronically Signed   By: Roanna Raider M.D.   On: 06/05/2015 23:52    EKG: Independently reviewed.  Abnormal findings:   Sinus tachycardia, septal Q waves  Assessment/Plan  1. DKA, severe  - Serum glucose 500 initially, bicarb <7, ketones in urine  - Suspect secondary to poor adherence to insulin regimen and poor understanding of his disease process as he describes adjusting his nightly basal insulin dose based on how much he eats for dinner that night - No obvious infectious source; systemic inflammatory features attributable to DKA; will culture if spikes fever  - Received 2 L NS bolus in ED, continuing on NS at 150 cc/hr overnight until glucose <250; will adjust as needed according to clinical condition  - Insulin infusion per protocol; start basal sq insulin when glucose <250, acidosis resolving, and gap closed; will feed at that time transition off insulin gtt to sq  - Following q4h chem panel, intervening as needed  - Monitor in step down given severe acidosis    2. Type 1 DM, uncontrolled  - A1c 16.2% on 03/22/15; pt reports that he has been doing better lately in terms of  glycemic control  - Repeat A1c this admission  - Educate pt regarding use of his basal insulin    3. Protein-calorie malnutrition - BMI <20  - Dietary consultation requested   4. Leukocytosis  - Suspect this is reactive secondary to DKA; hemoconcentration also contributing  - CXR clear, UA without sign of infection, and no fever - Will culture if spikes fever, but watch off abx for now   5. AKI  - SCr 1.92 on admission, up from apparent baseline of 0.8  - Likely secondary to DKA and associated intravascular volume depletion   - Suspect resolution with IVF resuscitation  - Following q4h chem panels over night  - Will pursue further w/u if fails to resolve with fluids as expected    6. Tachycardia, tachypnea, electrolyte derangements  - Secondary to DKA   - Tachycardia improving with IVF  - Tachypnea is compensatory in setting of marked metabolic acidosis - Hyperkalemia represents shift of K+ into extracellular compartments in setting of DKA; no K+ in IVF  - Will pursue further w/u if fails to resolve as expected with treatment of DKA    DVT ppx: SQ Heparin     Code Status: Full code Family Communication: None at bed side.    Disposition Plan: Admit to inpatient   Date of Service 06/06/2015    Briscoe Deutscherimothy S Opyd, MD [facility] Pager [phone]  If 7PM-7AM, please contact night-coverage [email or web address] Password [email or web address] 06/06/2015, 2:02 AM

## 2015-06-06 NOTE — ED Notes (Signed)
Will DC insulin drip and D5NS1/2 at 1250.

## 2015-06-06 NOTE — ED Notes (Signed)
Report given to rn on 5n 

## 2015-06-06 NOTE — ED Notes (Signed)
Admitting MD at the bedside.  

## 2015-06-06 NOTE — ED Notes (Signed)
Pharmacy notified about insulin drip.

## 2015-06-06 NOTE — Progress Notes (Signed)
Patient admitted after midnight, please see H&P.  Gap closed.  Will start home lantus and SSI -patient confused about regimen-- says he take lantus and 70/30 Will get diabetic education for him Suspect he can be d/c'd in AM  Marlin CanaryJessica Vann DO

## 2015-06-06 NOTE — ED Notes (Signed)
Dr. wickline at the bedside.  

## 2015-06-07 LAB — BASIC METABOLIC PANEL
ANION GAP: 14 (ref 5–15)
BUN: 13 mg/dL (ref 6–20)
CO2: 17 mmol/L — ABNORMAL LOW (ref 22–32)
Calcium: 8 mg/dL — ABNORMAL LOW (ref 8.9–10.3)
Chloride: 103 mmol/L (ref 101–111)
Creatinine, Ser: 0.99 mg/dL (ref 0.61–1.24)
GLUCOSE: 317 mg/dL — AB (ref 65–99)
POTASSIUM: 3.8 mmol/L (ref 3.5–5.1)
Sodium: 134 mmol/L — ABNORMAL LOW (ref 135–145)

## 2015-06-07 LAB — GLUCOSE, CAPILLARY
GLUCOSE-CAPILLARY: 235 mg/dL — AB (ref 65–99)
GLUCOSE-CAPILLARY: 177 mg/dL — AB (ref 65–99)

## 2015-06-07 MED ORDER — INSULIN ASPART 100 UNIT/ML FLEXPEN: PEN_INJECTOR | SUBCUTANEOUS | Status: DC

## 2015-06-07 MED ORDER — INSULIN ASPART 100 UNIT/ML FLEXPEN
8.0000 [IU] | PEN_INJECTOR | Freq: Three times a day (TID) | SUBCUTANEOUS | Status: DC
Start: 2015-06-07 — End: 2015-06-28

## 2015-06-07 MED FILL — !NOVOLOG FLEXPEN SYRINGE 1: 100/ML | 25 days supply | Qty: 6 | Fill #0

## 2015-06-07 NOTE — Progress Notes (Addendum)
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 Patient states [facility] Spoke to patient about limiting carb intake to 30-45 grams of carbs/meal 15 grams/snack.  DM 1 patients need only to follow a DM appropriate diet and cover the carbs with Novolog. Spoke to patient about consuming 65-70 grams/meal and 15-30 grams per snack per the ADA guidelines. Discussed alternative beverage options.  Discussed with patient about the different types of insulin 70/30/Lantus/Novolog. Discussed when long acting/short acting correction and short acting meal coverage is given. [facility] his November 2016 [facility] to get his Novolog, Patient only had Lantus.  Patient was scared to cover his glucose with his lantus when he was feeling ill. He said he was told in the past to not take all of his insulin when he is in that state. Spoke to him about sick day guidelines checking his glucose frequently and taking at least his basal insulin. Patient [facility] his Nov[facility].   MD- May want to d/c patient on Lantus dose, Novolog Correction sliding scale, and Novolog Meal coverage at time of discharge.  Thanks,  Christena DeemShannon Harsh Trulock RN, MSN, [facility] Inpatient Diabetes Coordinator Team Pager [phone] (8a-5p)

## 2015-06-07 NOTE — Progress Notes (Signed)
Patient discharged home with friend. Discharge instructions given. Prescriptions for Novolog given to patient. No issues at this time.

## 2015-06-07 NOTE — Plan of Care (Signed)
Problem: Food- and Nutrition-Related Knowledge Deficit (NB-1.1) Goal: Nutrition education Formal process to instruct or train a patient/client in a skill or to impart knowledge to help patients/clients voluntarily manage or modify food choices and eating behavior to maintain or improve health. Outcome: Completed/Met Date Met:  06/07/15  RD consulted for nutrition education regarding diabetes.     Lab Results  Component Value Date    HGBA1C 16.2* 03/22/2015    RD provided "Carbohydrate Counting for People with Diabetes" handout from the Academy of Nutrition and Dietetics. Discussed different food groups and their effects on blood sugar, emphasizing carbohydrate-containing foods. Provided list of carbohydrates and recommended serving sizes of common foods.  Discussed importance of controlled and consistent carbohydrate intake throughout the day. Provided examples of ways to balance meals/snacks and encouraged intake of high-fiber, whole grain complex carbohydrates. Discussed diabetic friendly drink options.Teach back method used.  Expect good compliance.  Body mass index is 19.7 kg/(m^2). Pt meets criteria for normal based on current BMI.  Current diet order is carb mod, patient is consuming approximately 100% of meals at this time. Labs and medications reviewed. Plans for pt to be discharged today. No further nutrition interventions warranted at this time. RD contact information provided. If additional nutrition issues arise, please re-consult RD.  Corrin Parker, MS, RD, LDN Pager # [phone] After hours/ weekend pager # [phone]

## 2015-06-07 NOTE — Discharge Summary (Signed)
Physician Discharge Summary  [name] [name]:[id number] DOB: 1985-06-08 DOA: 06/05/2015  PCP: [name], MD  Admit date: 06/05/2015 Discharge date: 06/07/2015  Time spent: 35 minutes  Recommendations for Outpatient Follow-up:  1. Need close follow up for diabetic education   Discharge Diagnoses:  Principal Problem:   Diabetic ketoacidosis without coma associated with type 1 diabetes mellitus (HCC) Active Problems:   Protein-calorie malnutrition, severe (HCC)   DKA (diabetic ketoacidoses) (HCC)   Discharge Condition: improved  Diet recommendation: carb mod  There were no vitals filed for this visit.  History of present illness:  [name] [name] is a 30 y.o. male with PMH of poorly controlled type 1 diabetes who presents to the ED with generalize weakness, malaise, and nausea/vomiting of one days duration. Patient reports that he had been in his usual state of health the day prior to his admission, but developed the aforementioned symptoms early today. He denies any recent fever, chills, cough, or dysuria and denies any use of alcohol or illicit substances. He reports adherence to his prescribed insulin regimen while acknowledging that he did not take his full basal dose Last night because he did not eat much. He checked his sugar after developing the symptoms and found it to be high. He was unable to control the hyperglycemia at home and came into the ED.  In ED, patient was found to be afebrile, saturating well on room air and normotensive, but with tachycardia and tachypnea. There was no acute findings on chest x-ray and EKG feature to sinus tachycardia with no acute ischemic changes. Initial blood work returned with profound electrolyte derangements and acute renal insufficiency. Patient's serum glucose was 500 and bicarbonate less than 7. Patient was bolused 2 L of normal saline in the emergency department and started on a glucose stabilizer. He remained tachycardic but  hemodynamically stable in the emergency department and was admitted to the hospital for ongoing evaluation and management of DKA.   Hospital Course:  DKA -patient was confused about his regimen -SSI + novolog pen -lantus  Procedures:    Consultations:  Diabetic coordinator  Discharge Exam: Filed Vitals:   06/06/15 2033 06/07/15 0517  BP: 101/62 119/74  Pulse: 92 89  Temp: 98.4 F (36.9 C) 98.3 F (36.8 C)  Resp: 16 16    General: awake, NAD  Discharge Instructions   Discharge Instructions    Diet Carb Modified    Complete by:  As directed      Discharge instructions    Complete by:  As directed   lantus = long acting novolog flex pen: 8 units plus SSI WITH MEALS     Increase activity slowly    Complete by:  As directed           Current Discharge Medication List    CONTINUE these medications which have CHANGED   Details  !! insulin aspart (NOVOLOG FLEXPEN) 100 UNIT/ML FlexPen Sliding scale- 0-15 units TID with meals  CBG 70 - 120: 0 units CBG 121 - 150: 2 units CBG 151 - 200: 3 units CBG 201 - 250: 5 units CBG 251 - 300: 8 units CBG 301 - 350: 11 units CBG 351 - 400: 15 units CBG 70 - 120: 0 units CBG 121 - 150: 2 units CBG 151 - 200: 3 units CBG 201 - 250: 5 units CBG 251 - 300: 8 units CBG 301 - 350: 11 units CBG 351 - 400: 15 units Qty: 15 mL, Refills: 11    !!  insulin aspart (NOVOLOG) 100 UNIT/ML FlexPen Inject 8 Units into the skin 3 (three) times daily with meals. Qty: 15 mL, Refills: 5     !! - Potential duplicate medications found. Please discuss with provider.    CONTINUE these medications which have NOT CHANGED   Details  Insulin Glargine (LANTUS) 100 UNIT/ML Solostar Pen Inject 25 Units into the skin daily at 10 pm. Qty: 15 mL, Refills: 5    Insulin Syringe-Needle U-100 (B-D INS SYRINGE 0.5CC/31GX5/16) 31G X 5/16" 0.5 ML MISC 1 each by Does not apply route 2 (two) times daily. Qty: 60 each, Refills: 11   Associated Diagnoses: DM  (diabetes mellitus), type 1, uncontrolled (HCC)    TRUEPLUS LANCETS 28G MISC 1 each by Does not apply route 3 (three) times daily. Qty: 100 each, Refills: 12      STOP taking these medications     lisinopril (PRINIVIL,ZESTRIL) 5 MG tablet        No Known Allergies Follow-up Information    Follow up with Lora PaulaFUNCHES, JOSALYN C, MD In 1 week.   Specialty:  Family Medicine   Contact information:   [address] [place] [address] [phone]        The results of significant diagnostics from this hospitalization (including imaging, microbiology, ancillary and laboratory) are listed below for reference.    Significant Diagnostic Studies: Dg Chest 2 View  06/05/2015  CLINICAL DATA:  Acute onset of shortness of breath and generalized weakness. Initial encounter. EXAM: CHEST  2 VIEW COMPARISON:  Chest radiograph performed 03/22/2015 FINDINGS: The lungs are well-aerated and clear. There is no evidence of focal opacification, pleural effusion or pneumothorax. The cardiomediastinal silhouette is within normal limits. No acute osseous abnormalities are seen. IMPRESSION: No acute cardiopulmonary process seen. Electronically Signed   By: Roanna RaiderJeffery  Chang M.D.   On: 06/05/2015 23:52    Microbiology: No results found for this or any previous visit (from the past 240 hour(s)).   Labs: Basic Metabolic Panel:  Recent Labs Lab 06/05/15 2312 06/06/15 0240 06/06/15 0552 06/06/15 1021 06/07/15 0015  NA 134* 141 139 137 134*  K 5.3* 4.9 4.1 3.4* 3.8  CL 99* 111 114* 111 103  CO2 <7* <7* 7* 14* 17*  GLUCOSE 500* 366* 234* 203* 317*  BUN 21* 21* 18 14 13   CREATININE 1.92* 1.66* 1.45* 0.85 0.99  CALCIUM 9.3 8.4* 7.9* 7.8* 8.0*  MG  --  2.0  --   --   --   PHOS  --  4.9*  --   --   --    Liver Function Tests: No results for input(s): AST, ALT, ALKPHOS, BILITOT, PROT, ALBUMIN in the last 168 hours. No results for input(s): LIPASE, AMYLASE in the last 168 hours. No results for  input(s): AMMONIA in the last 168 hours. CBC:  Recent Labs Lab 06/05/15 2312 06/07/15 0015  WBC 14.6* 6.6  HGB 16.2 13.0  HCT 48.6 37.1*  MCV 93.8 87.3  PLT 365 213   Cardiac Enzymes: No results for input(s): CKTOTAL, CKMB, CKMBINDEX, TROPONINI in the last 168 hours. BNP: BNP (last 3 results) No results for input(s): BNP in the last 8760 hours.  ProBNP (last 3 results) No results for input(s): PROBNP in the last 8760 hours.  CBG:  Recent Labs Lab 06/06/15 1722 06/06/15 1825 06/06/15 2335 06/07/15 0634 06/07/15 1138  GLUCAP 164* 170* 261* 235* 177*       Signed:  Alexandera Kuntzman U Yanelle Sousa DO.  [facility] 06/07/2015, 1:21  PM

## 2015-06-09 LAB — CBC
HEMATOCRIT: 37.1 % — AB (ref 39.0–52.0)
Hemoglobin: 13 g/dL (ref 13.0–17.0)
MCH: 30.6 pg (ref 26.0–34.0)
MCHC: 35 g/dL (ref 30.0–36.0)
MCV: 87.3 fL (ref 78.0–100.0)
PLATELETS: 213 10*3/uL (ref 150–400)
RBC: 4.25 MIL/uL (ref 4.22–5.81)
RDW: 13.1 % (ref 11.5–15.5)
WBC: 6.6 10*3/uL (ref 4.0–10.5)

## 2015-06-11 LAB — HEMOGLOBIN A1C
Hgb A1c MFr Bld: 15.8 % — ABNORMAL HIGH (ref 4.8–5.6)
MEAN PLASMA GLUCOSE: 407 mg/dL

## 2015-06-15 ENCOUNTER — Inpatient Hospital Stay (HOSPITAL_COMMUNITY)
Admission: EM | Admit: 2015-06-15 | Discharge: 2015-06-18 | DRG: 637 | Disposition: A | Discharge status: Home / Self Care | Payer: Self-pay | Attending: Internal Medicine | Admitting: Internal Medicine

## 2015-06-15 ENCOUNTER — Encounter (HOSPITAL_COMMUNITY): Payer: Self-pay

## 2015-06-15 ENCOUNTER — Emergency Department (HOSPITAL_COMMUNITY): Payer: Self-pay

## 2015-06-15 DIAGNOSIS — R0682 Tachypnea, not elsewhere classified: Secondary | ICD-10-CM | POA: Diagnosis present

## 2015-06-15 DIAGNOSIS — F329 Major depressive disorder, single episode, unspecified: Secondary | ICD-10-CM | POA: Diagnosis present

## 2015-06-15 DIAGNOSIS — E860 Dehydration: Secondary | ICD-10-CM | POA: Diagnosis present

## 2015-06-15 DIAGNOSIS — Z794 Long term (current) use of insulin: Secondary | ICD-10-CM

## 2015-06-15 DIAGNOSIS — R509 Fever, unspecified: Secondary | ICD-10-CM | POA: Diagnosis present

## 2015-06-15 DIAGNOSIS — N179 Acute kidney failure, unspecified: Secondary | ICD-10-CM | POA: Diagnosis present

## 2015-06-15 DIAGNOSIS — A419 Sepsis, unspecified organism: Secondary | ICD-10-CM | POA: Diagnosis present

## 2015-06-15 DIAGNOSIS — E872 Acidosis, unspecified: Secondary | ICD-10-CM

## 2015-06-15 DIAGNOSIS — Z87891 Personal history of nicotine dependence: Secondary | ICD-10-CM

## 2015-06-15 DIAGNOSIS — E876 Hypokalemia: Secondary | ICD-10-CM | POA: Diagnosis present

## 2015-06-15 DIAGNOSIS — Z9114 Patient's other noncompliance with medication regimen: Secondary | ICD-10-CM

## 2015-06-15 DIAGNOSIS — Z833 Family history of diabetes mellitus: Secondary | ICD-10-CM

## 2015-06-15 DIAGNOSIS — E1065 Type 1 diabetes mellitus with hyperglycemia: Secondary | ICD-10-CM

## 2015-06-15 DIAGNOSIS — R4182 Altered mental status, unspecified: Secondary | ICD-10-CM | POA: Diagnosis present

## 2015-06-15 DIAGNOSIS — T68XXXA Hypothermia, initial encounter: Secondary | ICD-10-CM

## 2015-06-15 DIAGNOSIS — IMO0002 Reserved for concepts with insufficient information to code with codable children: Secondary | ICD-10-CM

## 2015-06-15 DIAGNOSIS — R9431 Abnormal electrocardiogram [ECG] [EKG]: Secondary | ICD-10-CM | POA: Diagnosis present

## 2015-06-15 DIAGNOSIS — R000 Tachycardia, unspecified: Secondary | ICD-10-CM | POA: Diagnosis present

## 2015-06-15 DIAGNOSIS — D72829 Elevated white blood cell count, unspecified: Secondary | ICD-10-CM

## 2015-06-15 DIAGNOSIS — R680 Hypothermia, not associated with low environmental temperature: Secondary | ICD-10-CM | POA: Diagnosis present

## 2015-06-15 DIAGNOSIS — R6510 Systemic inflammatory response syndrome (SIRS) of non-infectious origin without acute organ dysfunction: Secondary | ICD-10-CM

## 2015-06-15 DIAGNOSIS — E1010 Type 1 diabetes mellitus with ketoacidosis without coma: Principal | ICD-10-CM

## 2015-06-15 LAB — HEPATIC FUNCTION PANEL
ALBUMIN: 3.8 g/dL (ref 3.5–5.0)
ALT: 26 U/L (ref 17–63)
AST: 23 U/L (ref 15–41)
Alkaline Phosphatase: 104 U/L (ref 38–126)
Total Bilirubin: 1.5 mg/dL — ABNORMAL HIGH (ref 0.3–1.2)
Total Protein: 7.1 g/dL (ref 6.5–8.1)

## 2015-06-15 LAB — I-STAT CG4 LACTIC ACID, ED: Lactic Acid, Venous: 2.4 mmol/L (ref 0.5–2.0)

## 2015-06-15 LAB — BASIC METABOLIC PANEL
BUN: 20 mg/dL (ref 6–20)
CO2: <7 mmol/L — ABNORMAL LOW (ref 22–32)
Calcium: 8.4 mg/dL — ABNORMAL LOW (ref 8.9–10.3)
Chloride: 106 mmol/L (ref 101–111)
Creatinine, Ser: 1.52 mg/dL — ABNORMAL HIGH (ref 0.61–1.24)
GFR calc Af Amer: >60 mL/min (ref >60)
GFR, EST NON AFRICAN AMERICAN: 60 mL/min — AB (ref >60)
GLUCOSE: 470 mg/dL — AB (ref 65–99)
POTASSIUM: 4.6 mmol/L (ref 3.5–5.1)
Sodium: 133 mmol/L — ABNORMAL LOW (ref 135–145)

## 2015-06-15 LAB — URINE MICROSCOPIC-ADD ON: WBC UA: NONE SEEN WBC/hpf (ref 0–5)

## 2015-06-15 LAB — DIFFERENTIAL
BASOS ABS: 0 10*3/uL (ref 0.0–0.1)
BASOS PCT: 0 %
EOS ABS: 0 10*3/uL (ref 0.0–0.7)
Eosinophils Relative: 0 %
LYMPHS PCT: 13 %
Lymphs Abs: 2.5 10*3/uL (ref 0.7–4.0)
MONO ABS: 2.9 10*3/uL — AB (ref 0.1–1.0)
Monocytes Relative: 15 %
Neutro Abs: 13.8 10*3/uL — ABNORMAL HIGH (ref 1.7–7.7)
Neutrophils Relative %: 72 %

## 2015-06-15 LAB — URINALYSIS, ROUTINE W REFLEX MICROSCOPIC
Bilirubin Urine: NEGATIVE
Glucose, UA: >1000 mg/dL — AB
Ketones, ur: >80 mg/dL — AB
LEUKOCYTES UA: NEGATIVE
NITRITE: NEGATIVE
PH: 5 (ref 5.0–8.0)
Protein, ur: 30 mg/dL — AB
SPECIFIC GRAVITY, URINE: 1.022 (ref 1.005–1.030)

## 2015-06-15 LAB — CBC
HEMATOCRIT: 45.9 % (ref 39.0–52.0)
HEMOGLOBIN: 15.2 g/dL (ref 13.0–17.0)
MCH: 30.9 pg (ref 26.0–34.0)
MCHC: 33.1 g/dL (ref 30.0–36.0)
MCV: 93.3 fL (ref 78.0–100.0)
Platelets: 496 10*3/uL — ABNORMAL HIGH (ref 150–400)
RBC: 4.92 MIL/uL (ref 4.22–5.81)
RDW: 13.2 % (ref 11.5–15.5)
WBC: 19.2 10*3/uL — AB (ref 4.0–10.5)

## 2015-06-15 LAB — I-STAT TROPONIN, ED
TROPONIN I, POC: 0 ng/mL (ref 0.00–0.08)
Troponin i, poc: 0.01 ng/mL (ref 0.00–0.08)

## 2015-06-15 LAB — PROCALCITONIN: Procalcitonin: 0.78 ng/mL

## 2015-06-15 LAB — CBG MONITORING, ED
GLUCOSE-CAPILLARY: 405 mg/dL — AB (ref 65–99)
Glucose-Capillary: 489 mg/dL — ABNORMAL HIGH (ref 65–99)
Glucose-Capillary: 446 mg/dL — ABNORMAL HIGH (ref 65–99)

## 2015-06-15 LAB — MAGNESIUM: MAGNESIUM: 2 mg/dL (ref 1.7–2.4)

## 2015-06-15 LAB — PHOSPHORUS: PHOSPHORUS: 4.2 mg/dL (ref 2.5–4.6)

## 2015-06-15 LAB — RAPID URINE DRUG SCREEN, HOSP PERFORMED
AMPHETAMINES: NOT DETECTED
BENZODIAZEPINES: NOT DETECTED
Barbiturates: NOT DETECTED
Cocaine: NOT DETECTED
OPIATES: NOT DETECTED
Tetrahydrocannabinol: POSITIVE — AB

## 2015-06-15 LAB — TROPONIN I

## 2015-06-15 LAB — MRSA PCR SCREENING: MRSA by PCR: NEGATIVE

## 2015-06-15 LAB — LACTIC ACID, PLASMA: LACTIC ACID, VENOUS: 1.3 mmol/L (ref 0.5–2.0)

## 2015-06-15 LAB — TSH: TSH: 1.248 u[IU]/mL (ref 0.350–4.500)

## 2015-06-15 MED ORDER — DEXTROSE-NACL 5-0.45 % IV SOLN
INTRAVENOUS | Status: DC
  Administered 2015-06-16: 125 mL/h via INTRAVENOUS
  Administered 2015-06-16: 09:00:00 via INTRAVENOUS

## 2015-06-15 MED ORDER — VANCOMYCIN HCL 10 G IV SOLR: 20.0000 mg/kg | Freq: Once | INTRAVENOUS | Status: DC

## 2015-06-15 MED ORDER — SODIUM CHLORIDE 0.9 % IV BOLUS (SEPSIS)
1000.0000 mL | Freq: Once | INTRAVENOUS | Status: AC
  Administered 2015-06-15: 1000 mL via INTRAVENOUS

## 2015-06-15 MED ORDER — PIPERACILLIN-TAZOBACTAM 3.375 G IVPB
3.3750 g | Freq: Three times a day (TID) | INTRAVENOUS | Status: DC
  Administered 2015-06-16 – 2015-06-17 (×5): 3.375 g via INTRAVENOUS
  Filled 2015-06-15 (×5): qty 50

## 2015-06-15 MED ORDER — SODIUM CHLORIDE 0.9% FLUSH
10.0000 mL | INTRAVENOUS | Status: DC | PRN
  Filled 2015-06-15: qty 40

## 2015-06-15 MED ORDER — SODIUM CHLORIDE 0.9 % IV SOLN: INTRAVENOUS | Status: AC

## 2015-06-15 MED ORDER — SODIUM CHLORIDE 0.9 % IV SOLN
INTRAVENOUS | Status: DC
  Administered 2015-06-15: 4.3 [IU]/h via INTRAVENOUS
  Filled 2015-06-15: qty 2.5

## 2015-06-15 MED ORDER — VANCOMYCIN HCL 500 MG IV SOLR
500.0000 mg | Freq: Two times a day (BID) | INTRAVENOUS | Status: DC
  Administered 2015-06-16 – 2015-06-17 (×3): 500 mg via INTRAVENOUS
  Filled 2015-06-15 (×3): qty 500

## 2015-06-15 MED ORDER — ENOXAPARIN SODIUM 40 MG/0.4ML ~~LOC~~ SOLN
40.0000 mg | Freq: Every day | SUBCUTANEOUS | Status: DC
  Administered 2015-06-15 – 2015-06-16 (×2): 40 mg via SUBCUTANEOUS
  Filled 2015-06-15 (×3): qty 0.4

## 2015-06-15 MED ORDER — POTASSIUM CHLORIDE 10 MEQ/100ML IV SOLN
10.0000 meq | INTRAVENOUS | Status: AC
  Administered 2015-06-15: 10 meq via INTRAVENOUS
  Filled 2015-06-15: qty 100

## 2015-06-15 MED ORDER — ONDANSETRON HCL 4 MG/2ML IJ SOLN: 4.0000 mg | Freq: Four times a day (QID) | INTRAMUSCULAR | Status: DC | PRN

## 2015-06-15 MED ORDER — SODIUM BICARBONATE 8.4 % IV SOLN
50.0000 meq | Freq: Once | INTRAVENOUS | Status: AC
  Administered 2015-06-15: 50 meq via INTRAVENOUS
  Filled 2015-06-15: qty 50

## 2015-06-15 MED ORDER — INSULIN REGULAR HUMAN 100 UNIT/ML IJ SOLN
INTRAMUSCULAR | Status: DC
  Filled 2015-06-15: qty 2.5

## 2015-06-15 MED ORDER — SODIUM CHLORIDE 0.9 % IV SOLN
INTRAVENOUS | Status: DC
  Administered 2015-06-15: 150 mL/h via INTRAVENOUS

## 2015-06-15 MED ORDER — SODIUM CHLORIDE 0.9 % IV SOLN: INTRAVENOUS | Status: DC

## 2015-06-15 MED ORDER — ONDANSETRON HCL 4 MG PO TABS: 4.0000 mg | ORAL_TABLET | Freq: Four times a day (QID) | ORAL | Status: DC | PRN

## 2015-06-15 MED ORDER — STERILE WATER FOR INJECTION IV SOLN
INTRAVENOUS | Status: AC
  Administered 2015-06-15: 20:00:00 via INTRAVENOUS
  Filled 2015-06-15: qty 850

## 2015-06-15 MED ORDER — PIPERACILLIN-TAZOBACTAM 3.375 G IVPB
3.3750 g | Freq: Once | INTRAVENOUS | Status: AC
  Administered 2015-06-15: 3.375 g via INTRAVENOUS
  Filled 2015-06-15: qty 50

## 2015-06-15 MED ORDER — DEXTROSE-NACL 5-0.45 % IV SOLN: INTRAVENOUS | Status: DC

## 2015-06-15 MED ORDER — VANCOMYCIN HCL IN DEXTROSE 1-5 GM/200ML-% IV SOLN
1000.0000 mg | INTRAVENOUS | Status: AC
  Administered 2015-06-15: 1000 mg via INTRAVENOUS
  Filled 2015-06-15: qty 200

## 2015-06-15 NOTE — ED Notes (Signed)
Per EMS- Patient states he woke this AM with SOB and not feeling well. CBG-341. Patient states he was recently hospitalized with DKA. Patient denies any N/V/D or abdominal pain.

## 2015-06-15 NOTE — ED Provider Notes (Signed)
Medical screening examination/treatment/procedure(s) were conducted as a shared visit with non-physician practitioner(s) and myself.  I personally evaluated the patient during the encounter.   EKG Interpretation   Date/Time:  Tuesday June 15 2015 16:22:08 EST Ventricular Rate:  118 PR Interval:  148 QRS Duration: 111 QT Interval:  317 QTC Calculation: 444 R Axis:   80 Text Interpretation:  Sinus tachycardia Probable anteroseptal infarct, old  Artifact in lead(s) V5 V6 and baseline wander in lead(s) V3 No significant  change since last tracing Confirmed by Toshiyuki Fredell MD, Reuel Boom ([id number]) on  06/15/2015 5:50:73 PM      30 year old male presents in DKA. He has multiple SIRS criteria including hypothermia, tachycardia, tachypnea, and elevation of white blood cell count to 19. Appears toxic on exam. No identified infection currently but covered for hospital-acquired infections given recent admission and delay in diagnostics. Delayed blood work secondary to difficulty with access. Patient is severely acidotic but remains in sinus rhythm and otherwise are now stable. Hospitalist admit. Asking for critical care input, discussed case with them and recommending admission to ICU or stepdown and will consult.  CRITICAL CARE Performed by: Lyndal Pulley Total critical care time: 30 minutes Critical care time was exclusive of separately billable procedures and treating other patients. Critical care was necessary to treat or prevent imminent or life-threatening deterioration. Critical care was time spent personally by me on the following activities: development of treatment plan with patient and/or surrogate as well as nursing, discussions with consultants, evaluation of patient's response to treatment, examination of patient, obtaining history from patient or surrogate, ordering and performing treatments and interventions, ordering and review of laboratory studies, ordering and review of radiographic studies,  pulse oximetry and re-evaluation of patient's condition.  Procedure note: Ultrasound Guided Peripheral IV Ultrasound guided peripheral 1.88 inch angiocath IV placement performed by me. Indications: Nursing unable to place IV. Details: The antecubital fossa and upper arm were evaluated with a multifrequency linear probe. Patent brachial veins were noted. 1 attempt was made to cannulate a vein under realtime US guidance with successful cannulation of the vein and catheter placement. There is return of non-pulsatile dark red blood. The patient tolerated the procedure well without complications. Images archived electronically.  CPT codes: 19147 and [id number]   .Central Line Date/Time: 06/15/2015 7:38 PM Performed by: Lyndal Pulley Authorized by: Lyndal Pulley Consent: Verbal consent obtained. Risks and benefits: risks, benefits and alternatives were discussed Consent given by: patient Patient understanding: patient states understanding of the procedure being performed Required items: required blood products, implants, devices, and special equipment available Patient identity confirmed: arm band, verbally with patient, provided demographic data and hospital-assigned identification number Indications: vascular access Anesthesia: local infiltration Local anesthetic: lidocaine 1% without epinephrine Anesthetic total: 5 ml Patient sedated: no Preparation: skin prepped with 2% chlorhexidine Skin prep agent dried: skin prep agent completely dried prior to procedure Sterile barriers: all five maximum sterile barriers used - cap, mask, sterile gown, sterile gloves, and large sterile sheet Hand hygiene: hand hygiene performed prior to central venous catheter insertion Location details: right femoral Site selection rationale: ease of access Patient position: reverse Trendelenburg Catheter type: triple lumen Catheter size: 7 Fr Pre-procedure: landmarks identified Ultrasound guidance: no Number of  attempts: 1 Successful placement: yes Post-procedure: line sutured Assessment: blood return through all ports Patient tolerance: Patient tolerated the procedure well with no immediate complications     See related encounter note   Lyndal Pulley, MD 06/15/15 2041

## 2015-06-15 NOTE — ED Notes (Signed)
Performed patient hygiene. Pt had urinated in bed. Changed patients gown, incont pad, and sheets. Pt tolerated it well.

## 2015-06-15 NOTE — H&P (Signed)
[facility] History and Physical  Darrin Koman UEA:[id number] DOB: August 06, 1985 DOA: 06/15/2015  Referring physician: Lyndal Pulley, M.D. PCP: [name], MD   Chief Complaint: Shortness of breath.  HPI: [name] [name] is a 30 y.o. male with a past medical history of type 1 diabetes, renal insufficiency who was brought to the [facility] emergency department by EMS due to shortness of breath and not feeling well. The patient was admitted for DKA on 06/06/2015 and this is his 12th admission in the past 23 months. The patient stated earlier not sure if he took his insulin today or not, but stated that his symptoms feel like his previous DKA episodes.  When seen, the patient looked toxic, but stable. He was hypothermic, tachycardic, mildly tachypneic and mildly confused. He denied headache, chest pain, abdominal pain, flank or back pain. Workup in the emergency department reveals severe acidosis with a pH of 6.8, WBC of 19,000, blood glucose was 470 mg/dL, bicarbonate was less than 7 mmol per liter. He was initially seen by critical care, who will remain on consult, but asked [place] to admit to the hospitalist service.   Review of Systems:  Unable to review due to the patient's mental status.  Past Medical History  Diagnosis Date  . Diabetes mellitus without complication (HCC) Dx 2012  . Renal insufficiency 2011    pt was on dialysis for a short time approx 2011, pt is no longer on dialysis   History reviewed. No pertinent past surgical history. Social History:  reports that he quit smoking about 20 months ago. He has never used smokeless tobacco. He reports that he does not drink alcohol or use illicit drugs.  No Known Allergies  Family History  Problem Relation Age of Onset  . Diabetes Father   . Diabetes Maternal Grandmother   . Heart disease Neg Hx   . Hypertension Neg Hx      Prior to Admission medications   Medication Sig Start Date End Date Taking?  Authorizing Provider  insulin aspart (NOVOLOG FLEXPEN) 100 UNIT/ML FlexPen Sliding scale- 0-15 units TID with meals  CBG 70 - 120: 0 units CBG 121 - 150: 2 units CBG 151 - 200: 3 units CBG 201 - 250: 5 units CBG 251 - 300: 8 units CBG 301 - 350: 11 units CBG 351 - 400: 15 units CBG 70 - 120: 0 units CBG 121 - 150: 2 units CBG 151 - 200: 3 units CBG 201 - 250: 5 units CBG 251 - 300: 8 units CBG 301 - 350: 11 units CBG 351 - 400: 15 units 06/07/15  Yes Jessica U Vann, DO  insulin aspart (NOVOLOG) 100 UNIT/ML FlexPen Inject 8 Units into the skin 3 (three) times daily with meals. 06/07/15  Yes Joseph Art, DO  Insulin Syringe-Needle U-100 (B-D INS SYRINGE 0.5CC/31GX5/16) 31G X 5/16" 0.5 ML MISC 1 each by Does not apply route 2 (two) times daily. 10/09/14  Yes Josalyn Funches, MD  TRUEPLUS LANCETS 28G MISC 1 each by Does not apply route 3 (three) times daily. 05/04/14  Yes Josalyn Funches, MD  Insulin Glargine (LANTUS) 100 UNIT/ML Solostar Pen Inject 25 Units into the skin daily at 10 pm. Patient not taking: Reported on 06/15/2015 03/24/15   Fuller Plan, MD   Physical Exam: Filed Vitals:   06/15/15 2100 06/15/15 2115 06/15/15 2130 06/15/15 2142  BP: 142/85 130/82 140/80   Pulse: 126 130    Temp:    96.3 F (35.7  C)  TempSrc:    Rectal  Resp: Weight:      SpO2: 100% 100%      Wt Readings from Last 3 Encounters:  06/15/15 54.432 kg (120 lb)  06/07/15 55.339 kg (122 lb)  06/01/15 54.885 kg (121 lb)    General:  Appears acutely ill. Eyes: PERRL, normal lids, irises & conjunctiva ENT: grossly normal hearing, lips and oral mucosa are dry. Neck: no LAD, masses or thyromegaly Cardiovascular: Tachycardic, no m/r/g. No LE edema. Telemetry: Sinus tachycardia at 126 bpm Respiratory: Tachypneic at 24 RPM, but CTA bilaterally, no w/r/r. No accessory muscle use Abdomen: soft, ntnd Skin: no rash or induration seen on limited exam Musculoskeletal: grossly normal tone  BUE/BLE Psychiatric: Lethargic. Neurologic: Lethargic and confused. Oriented to name and place only.           Labs on Admission:  Basic Metabolic Panel:  Recent Labs Lab 06/15/15 1515 06/15/15 1952  NA 133*  --   K 4.6  --   CL 106  --   CO2 <7*  --   GLUCOSE 470*  --   BUN 20  --   CREATININE 1.52*  --   CALCIUM 8.4*  --   MG  --  2.0  PHOS  --  4.2   Liver Function Tests:  Recent Labs Lab 06/15/15 1515  AST 23  ALT 26  ALKPHOS 104  BILITOT 1.5*  PROT 7.1  ALBUMIN 3.8   CBC:  Recent Labs Lab 06/15/15 1515  WBC 19.2*  NEUTROABS 13.8*  HGB 15.2  HCT 45.9  MCV 93.3  PLT 496*    CBG:  Recent Labs Lab 06/15/15 1346 06/15/15 1953 06/15/15 2108  GLUCAP 405* 489* 446*    Radiological Exams on Admission: Dg Chest 2 View  06/15/2015  CLINICAL DATA:  Upper chest pain, shortness of breath and weakness since waking up this morning. Initial encounter. EXAM: CHEST  2 VIEW COMPARISON:  PA and lateral chest 06/05/2015 and 03/22/2015. FINDINGS: The lungs are clear. There is no pneumothorax or pleural effusion. Heart size is normal. IMPRESSION: Negative chest. Electronically Signed   By: Drusilla Kanner M.D.   On: 06/15/2015 16:53    EKG: Independently reviewed. Vent. rate 118 BPM PR interval 148 ms QRS duration 111 ms QT/QTc 317/444 ms P-R-T axes 86 80 61 Sinus tachycardia Probable anteroseptal infarct, old Artifact in lead(s) V5 V6 and baseline wander in lead(s) V3 No significant change since previous EKG  Assessment/Plan Principal Problem:   DKA (diabetic ketoacidoses) (HCC) Admit to ICU. Continue vigorous IV fluid resuscitation. Continue insulin IV infusion. Continue sodium bicarbonate IV infusion. Monitor pH, electrolytes and renal function. I would like to thank ICU for their input.  Active Problems:   Sepsis Continue broad-spectrum IV antibiotics. Follow-up blood cultures.    Altered mental status He seems to be improved after the  patient received sodium bicarbonate. Check urine toxicology. Continue treatment for DKA, hypothermia and sepsis.    Hypothermia Continue warm blankets. Monitor temperature closely. Continue treatment of sepsis and DKA.    Leukocytosis Likely due to SIRS. Continue broad-spectrum IV antibiotics. Monitor WBC      Tachycardia Continue DKA and sepsis treatment. Check echocardiogram in the morning since the patient has an abnormal EKG.    Tachypnea Improved with IV fluids and insulin infusion. Continue treatment for DKA.     Abnormal EKG Check echocardiogram.    Critical care was consulted.    Code Status: Full  code. DVT Prophylaxis: Lovenox SQ. Family Communication: Disposition Plan: Admit to ICU for further workup and treatment.  Time spent: Over 120 minutes were used during the process of this admission.  Bobette Mo [facility] Pager [phone].

## 2015-06-15 NOTE — ED Notes (Signed)
Clicked off in error

## 2015-06-15 NOTE — ED Provider Notes (Signed)
CSN: [id number]     Arrival date & time 06/15/15  1335 History   First MD Initiated Contact with Patient 06/15/15 1539     Chief Complaint  Patient presents with  . Hyperglycemia   HPI   [name] [name] is a 30 y.o. M PMH significant for DM I, renal insufficiency presenting with SOB and generalized weakness since this morning. He is not sure if he took insulin today or not. He states this does not feel similar to previous DKA episodes. He denies fevers, chills, HA, abdominal pain, N/V/D.  He was recently admitted with DKA on 1/14, discharged 1/16.  Past Medical History  Diagnosis Date  . Diabetes mellitus without complication (HCC) Dx 2012  . Renal insufficiency 2011    pt was on dialysis for a short time approx 2011, pt is no longer on dialysis   History reviewed. No pertinent past surgical history. Family History  Problem Relation Age of Onset  . Diabetes Father   . Diabetes Maternal Grandmother   . Heart disease Neg Hx   . Hypertension Neg Hx    Social History  Substance Use Topics  . Smoking status: Former Smoker    Quit date: 09/19/2013  . Smokeless tobacco: Never Used  . Alcohol Use: No    Review of Systems  Ten systems are reviewed and are negative for acute change except as noted in the HPI  Allergies  Review of patient's allergies indicates no known allergies.  Home Medications   Prior to Admission medications   Medication Sig Start Date End Date Taking? Authorizing Provider  insulin aspart (NOVOLOG FLEXPEN) 100 UNIT/ML FlexPen Sliding scale- 0-15 units TID with meals  CBG 70 - 120: 0 units CBG 121 - 150: 2 units CBG 151 - 200: 3 units CBG 201 - 250: 5 units CBG 251 - 300: 8 units CBG 301 - 350: 11 units CBG 351 - 400: 15 units CBG 70 - 120: 0 units CBG 121 - 150: 2 units CBG 151 - 200: 3 units CBG 201 - 250: 5 units CBG 251 - 300: 8 units CBG 301 - 350: 11 units CBG 351 - 400: 15 units 06/07/15  Yes Jessica U Vann, DO  insulin aspart (NOVOLOG) 100  UNIT/ML FlexPen Inject 8 Units into the skin 3 (three) times daily with meals. 06/07/15  Yes Joseph Art, DO  Insulin Syringe-Needle U-100 (B-D INS SYRINGE 0.5CC/31GX5/16) 31G X 5/16" 0.5 ML MISC 1 each by Does not apply route 2 (two) times daily. 10/09/14  Yes Josalyn Funches, MD  TRUEPLUS LANCETS 28G MISC 1 each by Does not apply route 3 (three) times daily. 05/04/14  Yes Josalyn Funches, MD  Insulin Glargine (LANTUS) 100 UNIT/ML Solostar Pen Inject 25 Units into the skin daily at 10 pm. Patient not taking: Reported on 06/15/2015 03/24/15   Fuller Plan, MD   BP 151/88 mmHg  Pulse 126  Temp(Src) 97.6 F (36.4 C) (Oral)  Resp 18  SpO2 100% Physical Exam  Constitutional: He is oriented to person, place, and time. He appears well-developed and well-nourished. No distress.  HENT:  Head: Normocephalic and atraumatic.  Mouth/Throat: Oropharynx is clear and moist. No oropharyngeal exudate.  Eyes: Conjunctivae are normal. Pupils are equal, round, and reactive to light. Right eye exhibits no discharge. Left eye exhibits no discharge. No scleral icterus.  Neck: No tracheal deviation present.  Cardiovascular: Regular rhythm, normal heart sounds and intact distal pulses.  Exam reveals no gallop and no friction rub.  No murmur heard. Tachycardic  Pulmonary/Chest: Breath sounds normal. No respiratory distress. He has no wheezes. He has no rales. He exhibits no tenderness.  Tachypnea  Abdominal: Soft. Bowel sounds are normal. He exhibits no distension and no mass. There is no tenderness. There is no rebound and no guarding.  Musculoskeletal: He exhibits no edema or tenderness.  Lymphadenopathy:    He has no cervical adenopathy.  Neurological: He is alert and oriented to person, place, and time. Coordination normal.  Skin: Skin is warm and dry. No rash noted. He is not diaphoretic. No erythema.  Psychiatric:  Lethargic  Nursing note and vitals reviewed.  ED Course  Procedures  CRITICAL  CARE Performed by: Anselmo Rod Total critical care time: 35 minutes Critical care time was exclusive of separately billable procedures and treating other patients. Critical care was necessary to treat or prevent imminent or life-threatening deterioration. Critical care was time spent personally by me on the following activities: development of treatment plan with patient and/or surrogate as well as nursing, discussions with consultants, evaluation of patient's response to treatment, examination of patient, obtaining history from patient or surrogate, ordering and performing treatments and interventions, ordering and review of laboratory studies, ordering and review of radiographic studies, pulse oximetry and re-evaluation of patient's condition.  Labs Review Labs Reviewed  BASIC METABOLIC PANEL - Abnormal; Notable for the following:    Sodium 133 (*)    CO2 <7 (*)    Glucose, Bld 470 (*)    Creatinine, Ser 1.52 (*)    Calcium 8.4 (*)    GFR calc non Af Amer 60 (*)    All other components within normal limits  CBC - Abnormal; Notable for the following:    WBC 19.2 (*)    Platelets 496 (*)    All other components within normal limits  CBG MONITORING, ED - Abnormal; Notable for the following:    Glucose-Capillary 405 (*)    All other components within normal limits  I-STAT CG4 LACTIC ACID, ED - Abnormal; Notable for the following:    Lactic Acid, Venous 2.40 (*)    All other components within normal limits  URINE CULTURE  CULTURE, BLOOD (ROUTINE X 2)  CULTURE, BLOOD (ROUTINE X 2)  DIFFERENTIAL  URINALYSIS, ROUTINE W REFLEX MICROSCOPIC (NOT AT [facility])  TSH  HEPATIC FUNCTION PANEL  BLOOD GAS, ARTERIAL  CBG MONITORING, ED  I-STAT TROPOININ, ED   Imaging Review Dg Chest 2 View  06/15/2015  CLINICAL DATA:  Upper chest pain, shortness of breath and weakness since waking up this morning. Initial encounter. EXAM: CHEST  2 VIEW COMPARISON:  PA and lateral chest 06/05/2015 and  03/22/2015. FINDINGS: The lungs are clear. There is no pneumothorax or pleural effusion. Heart size is normal. IMPRESSION: Negative chest. Electronically Signed   By: Drusilla Kanner M.D.   On: 06/15/2015 16:53   I have personally reviewed and evaluated these images and lab results as part of my medical decision-making.   EKG Interpretation   Date/Time:  Tuesday June 15 2015 16:22:08 EST Ventricular Rate:  118 PR Interval:  148 QRS Duration: 111 QT Interval:  317 QTC Calculation: 444 R Axis:   80 Text Interpretation:  Sinus tachycardia Probable anteroseptal infarct, old  Artifact in lead(s) V5 V6 and baseline wander in lead(s) V3 No significant  change since last tracing Confirmed by KNOTT MD, Reuel Boom ([id number]) on  06/15/2015 5:50:37 PM      MDM   Final diagnoses:  Diabetic ketoacidosis without  coma associated with type 1 diabetes mellitus (HCC)  Leukocytosis  Hypothermia, initial encounter  SIRS (systemic inflammatory response syndrome) (HCC)  Lactic acidosis   Patient lethargic, tachypneic. Patient slightly confused regarding HPI questions.  Will work-up for infectious etiology vs DKA.  CBG of 405. Leukocytosis of 19.2. Will order rectal temp.  Rectal temp of 94.69F. Patient will be placed in bair hugger per ED tech.  CXR, EKG unremarkable for acute change.  Patient in DKA with CO2 of <7, glucose 470. Blood gas reveals temp correction pH of 6.825, pCO2 8.6, pO2 124. UA, LFTs, blood culture x 2, TSH pending. 3 L fluids, vancomycin, zosyn, insulin drip ordered prior to admission.  7:16 PM Patient will need ICU admission per Dr. Robb Matar and Dr. Clydene Pugh.  Ordered bicarb infusion.   ICU will not admit patient as he is not on pressors or intubated.  Dr. Clydene Pugh informed Dr. Robb Matar, who agreed to admit patient.  Patient was moved to resuscitation B and central line was placed by Dr. Clydene Pugh.   Melton Krebs, PA-C 06/15/15 2346  Lyndal Pulley, MD 06/16/15 [phone]

## 2015-06-15 NOTE — Consult Note (Signed)
Name: [name] [name] MRN: [id number] DOB: March 14, 1986    ADMISSION DATE:  06/15/2015 CONSULTATION DATE:  06/15/15  REFERRING MD :  TRH  CHIEF COMPLAINT:  Weakness   HISTORY OF PRESENT ILLNESS:  [name] [name] is a 30 y.o. male with a PMH of DM 1 and renal insufficiency.  He has had multiple admissions for DKA, most recent being 06/05/15 through 06/07/15.  He presented to [facility] ED 01/24 after waking up that morning not feeling well with generalized weakness and some SOB.  He did not have any fevers/chills/sweats, headache, chest pain, cough, N/V/D, abd pain, myalgias. He is unable to describe what his home insulin regimen is supposed to be. Unclear if this is due to confusion or him just not knowing.   In ED, he was found to be in DKA once again. He was admitted to [facility] service and PCCM called in consultation.  Of note, last admission for DKA, pt reported that he had not taken his full dose of basal insulin the night prior and was unable to get sugars controlled the following morning when he noted sugars to be high.  This is what prompted him to go to ED.  PAST MEDICAL HISTORY :   has a past medical history of Diabetes mellitus without complication (HCC) (Dx 2012) and Renal insufficiency (2011).  has no past surgical history on file. Prior to Admission medications   Medication Sig Start Date End Date Taking? Authorizing Provider  insulin aspart (NOVOLOG FLEXPEN) 100 UNIT/ML FlexPen Sliding scale- 0-15 units TID with meals  CBG 70 - 120: 0 units CBG 121 - 150: 2 units CBG 151 - 200: 3 units CBG 201 - 250: 5 units CBG 251 - 300: 8 units CBG 301 - 350: 11 units CBG 351 - 400: 15 units CBG 70 - 120: 0 units CBG 121 - 150: 2 units CBG 151 - 200: 3 units CBG 201 - 250: 5 units CBG 251 - 300: 8 units CBG 301 - 350: 11 units CBG 351 - 400: 15 units 06/07/15  Yes Jessica U Vann, DO  insulin aspart (NOVOLOG) 100 UNIT/ML FlexPen Inject 8 Units into the skin 3 (three) times daily with meals. 06/07/15   Yes Joseph Art, DO  Insulin Syringe-Needle U-100 (B-D INS SYRINGE 0.5CC/31GX5/16) 31G X 5/16" 0.5 ML MISC 1 each by Does not apply route 2 (two) times daily. 10/09/14  Yes Josalyn Funches, MD  TRUEPLUS LANCETS 28G MISC 1 each by Does not apply route 3 (three) times daily. 05/04/14  Yes Josalyn Funches, MD  Insulin Glargine (LANTUS) 100 UNIT/ML Solostar Pen Inject 25 Units into the skin daily at 10 pm. Patient not taking: Reported on 06/15/2015 03/24/15   Fuller Plan, MD   No Known Allergies  FAMILY HISTORY:  family history includes Diabetes in his father and maternal grandmother. There is no history of Heart disease or Hypertension. SOCIAL HISTORY:  reports that he quit smoking about 20 months ago. He has never used smokeless tobacco. He reports that he does not drink alcohol or use illicit drugs.  REVIEW OF SYSTEMS:   Bolds are positive  Constitutional: weight loss, gain, night sweats, Fevers, chills, fatigue .  HEENT: headaches, Sore throat, sneezing, nasal congestion, post nasal drip, Difficulty swallowing, Tooth/dental problems, visual complaints visual changes, ear ache CV:  chest pain, radiates: ,Orthopnea, PND, swelling in lower extremities, dizziness, palpitations, syncope.  GI  heartburn, indigestion, abdominal pain, nausea, vomiting, diarrhea, change in bowel habits, loss of appetite, bloody stools.  Resp: cough, productive: , hemoptysis, dyspnea, chest pain, pleuritic.  Skin: rash or itching or icterus GU: dysuria, change in color of urine, urgency or frequency. flank pain, hematuria  MS: joint pain or swelling. decreased range of motion  Psych: change in mood or affect. depression or anxiety.  Neuro: difficulty with speech, weakness, numbness, ataxia     SUBJECTIVE:   VITAL SIGNS: Temp:  [94.7 F (34.8 C)-97.6 F (36.4 C)] 94.7 F (34.8 C) (01/24 1755) Pulse Rate:  [113-126] 123 (01/24 1900) Resp:  [18-28] 22 (01/24 1900) BP: (137-151)/(87-95) 148/90 mmHg  (01/24 1900) SpO2:  [100 %] 100 % (01/24 1900) Weight:  [54.432 kg (120 lb)] 54.432 kg (120 lb) (01/24 1719)  PHYSICAL EXAMINATION: General: thin young male in NAD Neuro: Alert, oriented to self only HEENT: [place]/AT, PERRL, no JVD Cardiovascular: RRR, no MRG Lungs: Clear bilateral breath sounds Abdomen: Soft, non-tender, non-distended Musculoskeletal: No acute deformity or ROM limitation Skin: Grossly intact   Recent Labs Lab 06/15/15 1515  NA 133*  K 4.6  CL 106  CO2 <7*  BUN 20  CREATININE 1.52*  GLUCOSE 470*    Recent Labs Lab 06/15/15 1515  HGB 15.2  HCT 45.9  WBC 19.2*  PLT 496*   Dg Chest 2 View  06/15/2015  CLINICAL DATA:  Upper chest pain, shortness of breath and weakness since waking up this morning. Initial encounter. EXAM: CHEST  2 VIEW COMPARISON:  PA and lateral chest 06/05/2015 and 03/22/2015. FINDINGS: The lungs are clear. There is no pneumothorax or pleural effusion. Heart size is normal. IMPRESSION: Negative chest. Electronically Signed   By: Drusilla Kanner M.D.   On: 06/15/2015 16:53    SIGNIFICANT EVENTS  06/05/15 through 06/07/15 > admitted with DKA. 06/15/15 > back to ED with DKA.  STUDIES:  CXR 01/24 > no acute process.  MICRO: Blood 01/24 > Urine 01/24 > Sputum 01/24 >  ANTIBIOTICS: Vanc 01/24 > Zosyn 01/24 >   ASSESSMENT / PLAN:  DKA - of note, has had multiple admissions for this (2 in past 6 months; this being the 3rd). PCO2 within expected range. Protecting airway.     Plan: Admit to SDU under [facility] service. Insulin and fluids per DKA protocol. (bicarb gtt for 3 hours, then NS @ 125/hr until glucose < 250, then D5 1/2NS @ 105ml/hr. Hourly CBG monitoring Replete electrolytes as indicated. Assess Hgb A1c. Would have diabetes educator meet with pt prior to discharge to discuss outpatient regimen / management.  Concern for occult sepsis / sepsis of unclear etiology - hypothermia + leukocytosis. Plan: Empiric abx (vanc /  zosyn). Follow cultures. PCT algorithm to limit abx exposure.  Pseudohyponatremia (in the setting of hyperglycemia) - corrects to 138. AGMA - DKA + mild lactate. AoC renal insufficiency. ? Hypocalcemia - no albumin to correct for.  Plan: Fluids per DKA protocol. Replete electrolytes as indicated. Assess ionized calcium. Repeat lactate, troponin tonight. Frequent BMP's q4hrs.  Rest per primary team.  Joneen Roach, AGACNP-BC [facility] Pager [phone] or [phone]  06/15/2015 8:29 PM

## 2015-06-15 NOTE — ED Notes (Signed)
Lab bedside for blood draw

## 2015-06-15 NOTE — ED Notes (Signed)
Pt is aware of the need for urine sample.  

## 2015-06-15 NOTE — Progress Notes (Signed)
ANTIBIOTIC CONSULT NOTE - INITIAL  Pharmacy Consult for Vancomycin & Zosyn Indication: Sepsis  No Known Allergies  Patient Measurements: Weight: 120 lb (54.432 kg)  Vital Signs: Temp: 94.7 F (34.8 C) (01/24 1755) Temp Source: Rectal (01/24 1755) BP: 143/96 mmHg (01/24 1945) Pulse Rate: 123 (01/24 1945) Intake/Output from previous day:   Intake/Output from this shift: Total I/O In: 1200 [I.V.:1200] Out: -   Labs:  Recent Labs  06/15/15 1515  WBC 19.2*  HGB 15.2  PLT 496*  CREATININE 1.52*   Estimated Creatinine Clearance: 54.2 mL/min (by C-G formula based on Cr of 1.52). No results for input(s): VANCOTROUGH, VANCOPEAK, VANCORANDOM, GENTTROUGH, GENTPEAK, GENTRANDOM, TOBRATROUGH, TOBRAPEAK, TOBRARND, AMIKACINPEAK, AMIKACINTROU, AMIKACIN in the last 72 hours.   Microbiology: No results found for this or any previous visit (from the past 720 hour(s)).  Medical History: Past Medical History  Diagnosis Date  . Diabetes mellitus without complication (HCC) Dx 2012  . Renal insufficiency 2011    pt was on dialysis for a short time approx 2011, pt is no longer on dialysis    Medications:  Scheduled:   Infusions:  . dextrose 5 % and 0.45% NaCl    . insulin (NOVOLIN-R) infusion 4.3 Units/hr (06/15/15 1959)  . [START ON 06/16/2015] piperacillin-tazobactam (ZOSYN)  IV    .  sodium bicarbonate 150 mEq in sterile water 1000 mL infusion 100 mL/hr at 06/15/15 2000  . [START ON 06/16/2015] vancomycin     Assessment:  30 yr male presents with elevated CBG and shortness of breath.  Recent hospitalization for DKA.  In ED today found to be again in DKA.  Patient with hypothermia and elevated WBC  Pharmacy consulted to dose Zosyn and Vancomycin for sepsis  Received Zosyn 3.375gm IV x 1 in ED @ 17:48  Received Vancomycin 1gm IV x 1 in ED @ 18:36  CrCl ~ 54 ml/min  1/24 >>Vanc >> 1/24 >>Zosyn >>    1/24 blood: 1/24 urine: 1/24 sputum:   Trough/Dose change  info:   Goal of Therapy:  Vancomycin trough level 15-20 mcg/ml  Plan:  Measure antibiotic drug levels at steady state Follow up culture results  Zosyn 3.375gm IV q8h (each dose infused over 4 hrs) Vancomycin  IV q12h  Maryellen Pile, PharmD 06/15/2015,8:08 PM

## 2015-06-15 NOTE — ED Notes (Signed)
2 IV start attempts 

## 2015-06-16 ENCOUNTER — Inpatient Hospital Stay (HOSPITAL_COMMUNITY): Payer: Self-pay

## 2015-06-16 DIAGNOSIS — E876 Hypokalemia: Secondary | ICD-10-CM | POA: Diagnosis present

## 2015-06-16 DIAGNOSIS — R000 Tachycardia, unspecified: Secondary | ICD-10-CM

## 2015-06-16 DIAGNOSIS — E1010 Type 1 diabetes mellitus with ketoacidosis without coma: Principal | ICD-10-CM

## 2015-06-16 DIAGNOSIS — A419 Sepsis, unspecified organism: Secondary | ICD-10-CM | POA: Diagnosis present

## 2015-06-16 DIAGNOSIS — N179 Acute kidney failure, unspecified: Secondary | ICD-10-CM | POA: Diagnosis present

## 2015-06-16 LAB — MAGNESIUM: Magnesium: 1.6 mg/dL — ABNORMAL LOW (ref 1.7–2.4)

## 2015-06-16 LAB — BASIC METABOLIC PANEL
ANION GAP: 9 (ref 5–15)
ANION GAP: 8 (ref 5–15)
BUN: 27 mg/dL — ABNORMAL HIGH (ref 6–20)
BUN: 23 mg/dL — AB (ref 6–20)
BUN: 16 mg/dL (ref 6–20)
CALCIUM: 6.3 mg/dL — AB (ref 8.9–10.3)
CALCIUM: 6.8 mg/dL — AB (ref 8.9–10.3)
CHLORIDE: 111 mmol/L (ref 101–111)
CO2: <7 mmol/L — ABNORMAL LOW (ref 22–32)
CO2: 11 mmol/L — AB (ref 22–32)
CO2: 14 mmol/L — ABNORMAL LOW (ref 22–32)
Calcium: 6.7 mg/dL — ABNORMAL LOW (ref 8.9–10.3)
Chloride: 117 mmol/L — ABNORMAL HIGH (ref 101–111)
Chloride: 114 mmol/L — ABNORMAL HIGH (ref 101–111)
Creatinine, Ser: 1.62 mg/dL — ABNORMAL HIGH (ref 0.61–1.24)
Creatinine, Ser: 1.23 mg/dL (ref 0.61–1.24)
Creatinine, Ser: 1.17 mg/dL (ref 0.61–1.24)
GFR calc Af Amer: >60 mL/min (ref >60)
GFR calc non Af Amer: 55 mL/min — ABNORMAL LOW (ref >60)
GFR calc non Af Amer: >60 mL/min (ref >60)
GLUCOSE: 226 mg/dL — AB (ref 65–99)
GLUCOSE: 178 mg/dL — AB (ref 65–99)
Glucose, Bld: 353 mg/dL — ABNORMAL HIGH (ref 65–99)
POTASSIUM: 3.6 mmol/L (ref 3.5–5.1)
Potassium: 2.5 mmol/L — CL (ref 3.5–5.1)
Potassium: 2.5 mmol/L — CL (ref 3.5–5.1)
SODIUM: 135 mmol/L (ref 135–145)
SODIUM: 136 mmol/L (ref 135–145)
Sodium: 137 mmol/L (ref 135–145)

## 2015-06-16 LAB — COMPREHENSIVE METABOLIC PANEL
ALT: 23 U/L (ref 17–63)
AST: 20 U/L (ref 15–41)
Albumin: 2.8 g/dL — ABNORMAL LOW (ref 3.5–5.0)
Alkaline Phosphatase: 80 U/L (ref 38–126)
BUN: 27 mg/dL — AB (ref 6–20)
CHLORIDE: 118 mmol/L — AB (ref 101–111)
CO2: <7 mmol/L — ABNORMAL LOW (ref 22–32)
CREATININE: 1.3 mg/dL — AB (ref 0.61–1.24)
Calcium: 6.6 mg/dL — ABNORMAL LOW (ref 8.9–10.3)
GFR calc Af Amer: >60 mL/min (ref >60)
Glucose, Bld: 228 mg/dL — ABNORMAL HIGH (ref 65–99)
Potassium: 3.2 mmol/L — ABNORMAL LOW (ref 3.5–5.1)
Sodium: 139 mmol/L (ref 135–145)
Total Bilirubin: 1.7 mg/dL — ABNORMAL HIGH (ref 0.3–1.2)
Total Protein: 5.6 g/dL — ABNORMAL LOW (ref 6.5–8.1)

## 2015-06-16 LAB — CBC WITH DIFFERENTIAL/PLATELET
Basophils Absolute: 0 10*3/uL (ref 0.0–0.1)
Basophils Relative: 0 %
EOS PCT: 0 %
Eosinophils Absolute: 0 10*3/uL (ref 0.0–0.7)
HEMATOCRIT: 36 % — AB (ref 39.0–52.0)
HEMOGLOBIN: 12.6 g/dL — AB (ref 13.0–17.0)
LYMPHS PCT: 14 %
Lymphs Abs: 1.6 10*3/uL (ref 0.7–4.0)
MCH: 30.7 pg (ref 26.0–34.0)
MCHC: 35 g/dL (ref 30.0–36.0)
MCV: 87.6 fL (ref 78.0–100.0)
MONOS PCT: 8 %
Monocytes Absolute: 0.9 10*3/uL (ref 0.1–1.0)
NEUTROS PCT: 78 %
Neutro Abs: 8.8 10*3/uL — ABNORMAL HIGH (ref 1.7–7.7)
PLATELETS: 279 10*3/uL (ref 150–400)
RBC: 4.11 MIL/uL — AB (ref 4.22–5.81)
RDW: 12.7 % (ref 11.5–15.5)
WBC: 11.3 10*3/uL — AB (ref 4.0–10.5)

## 2015-06-16 LAB — BLOOD GAS, ARTERIAL
ACID-BASE DEFICIT: 34.4 mmol/L — AB (ref 0.0–2.0)
Bicarbonate: 1.4 mEq/L — ABNORMAL LOW (ref 20.0–24.0)
DRAWN BY: 276051
FIO2: 0.21
O2 SAT: 97 %
PATIENT TEMPERATURE: 94.7
PO2 ART: 124 mmHg — AB (ref 80.0–100.0)
TCO2: 1.5 mmol/L (ref 0–100)
pCO2 arterial: 8.6 mmHg — CL (ref 35.0–45.0)
pH, Arterial: 6.825 — CL (ref 7.350–7.450)

## 2015-06-16 LAB — GLUCOSE, CAPILLARY
GLUCOSE-CAPILLARY: 393 mg/dL — AB (ref 65–99)
GLUCOSE-CAPILLARY: 230 mg/dL — AB (ref 65–99)
GLUCOSE-CAPILLARY: 228 mg/dL — AB (ref 65–99)
GLUCOSE-CAPILLARY: 218 mg/dL — AB (ref 65–99)
GLUCOSE-CAPILLARY: 228 mg/dL — AB (ref 65–99)
GLUCOSE-CAPILLARY: 204 mg/dL — AB (ref 65–99)
GLUCOSE-CAPILLARY: 220 mg/dL — AB (ref 65–99)
GLUCOSE-CAPILLARY: 182 mg/dL — AB (ref 65–99)
GLUCOSE-CAPILLARY: 257 mg/dL — AB (ref 65–99)
GLUCOSE-CAPILLARY: 254 mg/dL — AB (ref 65–99)
GLUCOSE-CAPILLARY: 219 mg/dL — AB (ref 65–99)
GLUCOSE-CAPILLARY: 171 mg/dL — AB (ref 65–99)
GLUCOSE-CAPILLARY: 185 mg/dL — AB (ref 65–99)
GLUCOSE-CAPILLARY: 156 mg/dL — AB (ref 65–99)
Glucose-Capillary: 274 mg/dL — ABNORMAL HIGH (ref 65–99)
Glucose-Capillary: 374 mg/dL — ABNORMAL HIGH (ref 65–99)
Glucose-Capillary: 220 mg/dL — ABNORMAL HIGH (ref 65–99)
Glucose-Capillary: 245 mg/dL — ABNORMAL HIGH (ref 65–99)
Glucose-Capillary: 267 mg/dL — ABNORMAL HIGH (ref 65–99)
Glucose-Capillary: 149 mg/dL — ABNORMAL HIGH (ref 65–99)
Glucose-Capillary: 201 mg/dL — ABNORMAL HIGH (ref 65–99)
Glucose-Capillary: 167 mg/dL — ABNORMAL HIGH (ref 65–99)

## 2015-06-16 LAB — PHOSPHORUS
PHOSPHORUS: 3 mg/dL (ref 2.5–4.6)
Phosphorus: 1 mg/dL — CL (ref 2.5–4.6)

## 2015-06-16 MED ORDER — POTASSIUM CHLORIDE 2 MEQ/ML IV SOLN
INTRAVENOUS | Status: DC
  Administered 2015-06-16 – 2015-06-17 (×2): via INTRAVENOUS
  Filled 2015-06-16 (×7): qty 1000

## 2015-06-16 MED ORDER — INSULIN GLARGINE 100 UNIT/ML ~~LOC~~ SOLN
25.0000 [IU] | Freq: Every day | SUBCUTANEOUS | Status: DC
  Administered 2015-06-16 – 2015-06-18 (×3): 25 [IU] via SUBCUTANEOUS
  Filled 2015-06-16 (×3): qty 0.25

## 2015-06-16 MED ORDER — INSULIN ASPART 100 UNIT/ML ~~LOC~~ SOLN: 0.0000 [IU] | Freq: Every day | SUBCUTANEOUS | Status: DC

## 2015-06-16 MED ORDER — POTASSIUM CHLORIDE CRYS ER 20 MEQ PO TBCR
40.0000 meq | EXTENDED_RELEASE_TABLET | ORAL | Status: AC
  Administered 2015-06-16 (×3): 40 meq via ORAL
  Filled 2015-06-16 (×3): qty 2

## 2015-06-16 MED ORDER — INSULIN ASPART 100 UNIT/ML ~~LOC~~ SOLN
3.0000 [IU] | Freq: Three times a day (TID) | SUBCUTANEOUS | Status: DC
  Administered 2015-06-16 – 2015-06-18 (×5): 3 [IU] via SUBCUTANEOUS

## 2015-06-16 MED ORDER — SODIUM CHLORIDE 0.9 % IV SOLN
1.0000 g | Freq: Once | INTRAVENOUS | Status: AC
  Administered 2015-06-16: 1 g via INTRAVENOUS
  Filled 2015-06-16 (×2): qty 10

## 2015-06-16 MED ORDER — MAGNESIUM SULFATE 4 GM/100ML IV SOLN
4.0000 g | Freq: Once | INTRAVENOUS | Status: AC
  Administered 2015-06-16: 4 g via INTRAVENOUS
  Filled 2015-06-16: qty 100

## 2015-06-16 MED ORDER — INSULIN ASPART 100 UNIT/ML ~~LOC~~ SOLN
0.0000 [IU] | Freq: Three times a day (TID) | SUBCUTANEOUS | Status: DC
  Administered 2015-06-17: 5 [IU] via SUBCUTANEOUS
  Administered 2015-06-17 (×2): 2 [IU] via SUBCUTANEOUS
  Administered 2015-06-18: 5 [IU] via SUBCUTANEOUS

## 2015-06-16 MED ORDER — POTASSIUM PHOSPHATES 15 MMOLE/5ML IV SOLN
40.0000 meq | Freq: Once | INTRAVENOUS | Status: AC
  Administered 2015-06-16: 40 meq via INTRAVENOUS
  Filled 2015-06-16: qty 9.09

## 2015-06-16 NOTE — Progress Notes (Signed)
CRITICAL VALUE ALERT  Critical value received:  6.3 Calcium and 2.5 Potassium  Date of notification:  06/16/15  Time of notification:  0845  Critical value read back:Yes.    Nurse who received alert:  Josephina Shih, RN  MD notified (1st page):  Dr. Gonzella Lex  Time of first page:  [phone]  Order placed for calcium and potassium replacement.

## 2015-06-16 NOTE — Progress Notes (Signed)
Inpatient Diabetes Program Recommendations  AACE/ADA: New Consensus Statement on Inpatient Glycemic Control (2015)  Target Ranges:  Prepandial:   less than 140 mg/dL      Peak postprandial:   less than 180 mg/dL (1-2 hours)      Critically ill patients:  140 - 180 mg/dL   Review of Glycemic Control  Presented with severe metabolic acidosis. Slowly improving. Anion gap closed this morning but with low bicarbonate of 11. Continue insulin drip until bicarbonate is >15.    Diabetes history: DM1 Outpatient Diabetes medications: Novolog 0-15 units tidwc + 8 units tidwc - previously on Lantus 25 units QHS Current orders for Inpatient glycemic control: GlucoStabilizer per DKA protocol  Results for [name] [name], [name] [name] (MRN [id number]) as of 06/16/2015 17:07  Ref. Range 06/16/2015 15:56  Sodium Latest Ref Range: 135-145 mmol/L 136  Potassium Latest Ref Range: 3.5-5.1 mmol/L 2.5 (LL)  Chloride Latest Ref Range: 101-111 mmol/L 114 (H)  CO2 Latest Ref Range: 22-32 mmol/L 14 (L)  BUN Latest Ref Range: 6-20 mg/dL 16  Creatinine Latest Ref Range: 0.61-1.24 mg/dL 1.17  Calcium Latest Ref Range: 8.9-10.3 mg/dL 6.8 (L)  EGFR (Non-African Amer.) Latest Ref Range: >60 mL/min >60  EGFR (African American) Latest Ref Range: >60 mL/min >60  Glucose Latest Ref Range: 65-99 mg/dL 178 (H)  Anion gap Latest Ref Range: 5-15  8      Inpatient Diabetes Program Recommendations:    Continue with IV insulin per DKA orders. Give Lantus 12 units 2 hours prior to discontinuation of insulin drip. Novolog sensitive tidwc and HS Will need meal coverage insulin when diet advanced to CHO mod med - Novolog 4 units tidwc Will speak with pt on 1/26 regarding his diabetes control at home.  Will continue to follow. Thank you. Lorenda Peck, RD, LDN, CDE Inpatient Diabetes Coordinator [phone]

## 2015-06-16 NOTE — Progress Notes (Signed)
Name: [name] [name] MRN: [id number] DOB: 1985-10-06    ADMISSION DATE:  06/15/2015 CONSULTATION DATE:  06/15/15  REFERRING MD :  TRH  CHIEF COMPLAINT:  Weakness   HISTORY OF PRESENT ILLNESS:  Dan Scearce is a 30 y.o. male with a PMH of DM 1 and renal insufficiency.  He has had multiple admissions for DKA, most recent being 06/05/15 through 06/07/15.  He presented to [facility] ED 01/24 after waking up that morning not feeling well with generalized weakness and some SOB.  He did not have any fevers/chills/sweats, headache, chest pain, cough, N/V/D, abd pain, myalgias. He is unable to describe what his home insulin regimen is supposed to be. Unclear if this is due to confusion or him just not knowing.   In ED, he was found to be in DKA once again. He was admitted to [facility] service and PCCM called in consultation.  Of note, last admission for DKA, pt reported that he had not taken his full dose of basal insulin the night prior and was unable to get sugars controlled the following morning when he noted sugars to be high.  This is what prompted him to go to ED.  PAST MEDICAL HISTORY :   has a past medical history of Diabetes mellitus without complication (HCC) (Dx 2012) and Renal insufficiency (2011).  has no past surgical history on file. Prior to Admission medications   Medication Sig Start Date End Date Taking? Authorizing Provider  insulin aspart (NOVOLOG FLEXPEN) 100 UNIT/ML FlexPen Sliding scale- 0-15 units TID with meals  CBG 70 - 120: 0 units CBG 121 - 150: 2 units CBG 151 - 200: 3 units CBG 201 - 250: 5 units CBG 251 - 300: 8 units CBG 301 - 350: 11 units CBG 351 - 400: 15 units CBG 70 - 120: 0 units CBG 121 - 150: 2 units CBG 151 - 200: 3 units CBG 201 - 250: 5 units CBG 251 - 300: 8 units CBG 301 - 350: 11 units CBG 351 - 400: 15 units 06/07/15  Yes Jessica U Vann, DO  insulin aspart (NOVOLOG) 100 UNIT/ML FlexPen Inject 8 Units into the skin 3 (three) times daily with meals. 06/07/15   Yes Joseph Art, DO  Insulin Syringe-Needle U-100 (B-D INS SYRINGE 0.5CC/31GX5/16) 31G X 5/16" 0.5 ML MISC 1 each by Does not apply route 2 (two) times daily. 10/09/14  Yes Josalyn Funches, MD  TRUEPLUS LANCETS 28G MISC 1 each by Does not apply route 3 (three) times daily. 05/04/14  Yes Josalyn Funches, MD  Insulin Glargine (LANTUS) 100 UNIT/ML Solostar Pen Inject 25 Units into the skin daily at 10 pm. Patient not taking: Reported on 06/15/2015 03/24/15   Fuller Plan, MD   No Known Allergies  FAMILY HISTORY:  family history includes Diabetes in his father and maternal grandmother. There is no history of Heart disease or Hypertension. SOCIAL HISTORY:  reports that he quit smoking about 20 months ago. He has never used smokeless tobacco. He reports that he does not drink alcohol or use illicit drugs.  REVIEW OF SYSTEMS:   Bolds are positive  Constitutional: weight loss, gain, night sweats, Fevers, chills, fatigue .  HEENT: headaches, Sore throat, sneezing, nasal congestion, post nasal drip, Difficulty swallowing, Tooth/dental problems, visual complaints visual changes, ear ache CV:  chest pain, radiates: ,Orthopnea, PND, swelling in lower extremities, dizziness, palpitations, syncope.  GI  heartburn, indigestion, abdominal pain, nausea, vomiting, diarrhea, change in bowel habits, loss of appetite, bloody stools.  Resp: cough, productive: , hemoptysis, dyspnea, chest pain, pleuritic.  Skin: rash or itching or icterus GU: dysuria, change in color of urine, urgency or frequency. flank pain, hematuria  MS: joint pain or swelling. decreased range of motion  Psych: change in mood or affect. depression or anxiety.  Neuro: difficulty with speech, weakness, numbness, ataxia   SUBJECTIVE:   VITAL SIGNS: Temp:  [94.7 F (34.8 C)-100.6 F (38.1 C)] 100.6 F (38.1 C) (01/25 0800) Pulse Rate:  [98-130] 98 (01/25 0800) Resp:  [13-28] 17 (01/25 0800) BP: (114-160)/(55-102) 133/86 mmHg (01/25  0800) SpO2:  [98 %-100 %] 100 % (01/25 0800) Weight:  [115 lb 4.8 oz (52.3 kg)-120 lb (54.432 kg)] 115 lb 4.8 oz (52.3 kg) (01/24 2300)  PHYSICAL EXAMINATION: General: No distress Neuro: Awake, alert, no distress HEENT: PERRL, no JVD Cardiovascular: RRR, no MRG Lungs: Clear bilateral breath sounds Abdomen: Soft, non-tender, non-distended Musculoskeletal: No acute deformity or ROM limitation Skin: Grossly intact   Recent Labs Lab 06/15/15 2300 06/16/15 0250 06/16/15 0750  NA 135 139 137  K 3.6 3.2* 2.5*  CL 111 118* 117*  CO2 <7* <7* 11*  BUN 27* 27* 23*  CREATININE 1.62* 1.30* 1.23  GLUCOSE 353* 228* 226*    Recent Labs Lab 06/15/15 1515 06/16/15 0250  HGB 15.2 12.6*  HCT 45.9 36.0*  WBC 19.2* 11.3*  PLT 496* 279   Dg Chest 2 View  06/15/2015  CLINICAL DATA:  Upper chest pain, shortness of breath and weakness since waking up this morning. Initial encounter. EXAM: CHEST  2 VIEW COMPARISON:  PA and lateral chest 06/05/2015 and 03/22/2015. FINDINGS: The lungs are clear. There is no pneumothorax or pleural effusion. Heart size is normal. IMPRESSION: Negative chest. Electronically Signed   By: Drusilla Kanner M.D.   On: 06/15/2015 16:53    SIGNIFICANT EVENTS  06/05/15 through 06/07/15 > admitted with DKA. 06/15/15 > back to ED with DKA.  STUDIES:  CXR 01/24 > no acute process.  MICRO: Blood 01/24 > Urine 01/24 > Sputum 01/24 >  ANTIBIOTICS: Vanc 01/24 > Zosyn 01/24 >   ASSESSMENT / PLAN:  DKA - of note, has had multiple admissions for this (2 in past 6 months; this being the 3rd). PCO2 within expected range. Protecting airway.     Plan: Insulin and fluids per DKA protocol.  Bicarb drip Transition to lantus later today Hourly CBG monitoring Replete electrolytes as indicated. Pt needs reeducation on diabetes management before discharge.  Concern for occult sepsis / sepsis of unclear etiology - hypothermia + leukocytosis. Plan: Empiric abx (vanc /  zosyn). PCT is low. Can consider stopping abx if cultures are negative.   Pseudohyponatremia (in the setting of hyperglycemia) - corrects to 138. AGMA - DKA + mild lactate. AoC renal insufficiency. ? Hypocalcemia - no albumin to correct for.  Plan: Fluids per DKA protocol. Replete electrolytes as indicated. Lactic acid has normalized.  Frequent BMP's q4hrs.  Rest per primary team.  Chilton Greathouse MD [facility] Pager [phone] If no answer or after 3pm call: [phone] 06/16/2015, 8:57 AM

## 2015-06-16 NOTE — Progress Notes (Signed)
  Echocardiogram 2D Echocardiogram has been performed.  [name] [name] 06/16/2015, 12:03 PM

## 2015-06-16 NOTE — Progress Notes (Signed)
[facility] PROGRESS NOTE  Mendy Lapinsky NWG:[id number] DOB: 1986-01-25 DOA: 06/15/2015 PCP: [name], MD  Brief narrative 30 year old male with type 1 diabetes mellitus with multiple hospitalization for DKA (10th hospitalization in the last 12 months, mostly for DKA), medication noncompliance and depression was discharged less than 10 days after being admitted for DKA was brought to the ED by EMS as patient reported not feeling well and having shortness of breath. Patient reported that he did not take his insulin on the day of admission and that his symptoms felt similar to his prior EKGs. In the ED patient was hypothermic, tachycardic and tachypneic with some confusion. In the ED he was severely acidotic with pH of 6.8, WBC of 19 K and blood glucose of 470. Bicarbonate was <7. Lactic acid was elevated to 2.4. UA and chest x-ray were unremarkable for infection. Patient was started on glucose stabilizer for DKA and sepsis pathway initiated in the ED. Critical care consulted. Patient admitted to stepdown unit on the hospitalist service.    Assessment/Plan: Type 1 diabetes mellitus with recurrent DKA's Presented with severe metabolic acidosis. Slowly improving. Anion gap closed this morning but with low bicarbonate of 11. Continue insulin drip until bicarbonate is >15. Continue IV hydration. -Recommend hospitalization for DKA in the setting of medication nonadherence. (Patient was hospitalized in November 2016 for DKA but never went back to the [facility] as a follow-up. Reports taking his Lantus and aspart as prescribed and his blood glucoses ranging from 250-375. The last time he saw his PCP was July 2016). -Monitor BMET  every 4 hours. Continue with hydration. -Diabetic coronary consult. Needs counseling on diet and medication adherence once more stable. He needs to follow-up at the wellness Center on a regular basis. -Appreciate PCCM consult and follow-up.  Sepsis Patient  hypothermic with leukocytosis on presentation. This morning had fever of 100.6 Fahrenheit. On empiric vancomycin and Zosyn. Possibly in the setting of severe DKA. No clear source of infection. If cultures negative and sepsis results will discontinue antibiotics. Lactic acid normalized.  Acute kidney injury Secondary to severe dehydration. Resolved with IV fluids.  Pseudohyponatremia Associated with DKA. Corrected sodium normal.  Hypokalemia and hypomagnesemia ( 2.5 and 1.6) Replenished. Monitor on telemetry.  Hypocalcemia Corrected calcium of 7.3 Ordered IV calcium gluconate.  Diet: Clear liquid DVT prophylaxis: Subcutaneous Lovenox   Code Status: Full code Family Communication: None at bedside Disposition Plan: Continue step down monitoring   Consultants:  PCCM  Procedures:  none  Antibiotics:  vanco and zosyn  HPI/Subjective: Reports feeling tired. Says he sees Dr Heywood Bene twice a month as outpt and being compliant with his insulin and diet regimen ( fsg ranges 250-375 at home). Febrile to 106F this am.   Objective: Filed Vitals:   06/16/15 0700 06/16/15 0800  BP: 129/75 133/86  Pulse: 104 98  Temp:  100.6 F (38.1 C)  Resp: 15 17    Intake/Output Summary (Last 24 hours) at 06/16/15 0857 Last data filed at 06/16/15 0800  Gross per 24 hour  Intake 2954.79 ml  Output   2420 ml  Net 534.79 ml   Filed Weights   06/15/15 1719 06/15/15 2300  Weight: 54.432 kg (120 lb) 52.3 kg (115 lb 4.8 oz)    Exam:   General:  Appears fatigued and sleepy  HEENT: dry mucosa, no pallor,  Cardiovascular: S1&S2 tachycardic, no murmurs, rubs or gallop  Respiratory: clear b/l  Abdomen: soft, NT,ND, BS+  Musculoskeletal: warm, no edema, rt femoral  line  CNS: sleepy but arousable and oriented   Data Reviewed: Basic Metabolic Panel:  Recent Labs Lab 06/15/15 1515 06/15/15 1952 06/15/15 2300 06/16/15 0250 06/16/15 0750  NA 133*  --  135 139 137  K 4.6  --   3.6 3.2* 2.5*  CL 106  --  111 118* 117*  CO2 <7*  --  <7* <7* 11*  GLUCOSE 470*  --  353* 228* 226*  BUN 20  --  27* 27* 23*  CREATININE 1.52*  --  1.62* 1.30* 1.23  CALCIUM 8.4*  --  6.7* 6.6* 6.3*  MG  --  2.0  --   --   --   PHOS  --  4.2 3.0 1.0*  --    Liver Function Tests:  Recent Labs Lab 06/15/15 1515 06/16/15 0250  AST 23 20  ALT 26 23  ALKPHOS 104 80  BILITOT 1.5* 1.7*  PROT 7.1 5.6*  ALBUMIN 3.8 2.8*   No results for input(s): LIPASE, AMYLASE in the last 168 hours. No results for input(s): AMMONIA in the last 168 hours. CBC:  Recent Labs Lab 06/15/15 1515 06/16/15 0250  WBC 19.2* 11.3*  NEUTROABS 13.8* 8.8*  HGB 15.2 12.6*  HCT 45.9 36.0*  MCV 93.3 87.6  PLT 496* 279   Cardiac Enzymes:  Recent Labs Lab 06/15/15 2300  TROPONINI <0.03   BNP (last 3 results) No results for input(s): BNP in the last 8760 hours.  ProBNP (last 3 results) No results for input(s): PROBNP in the last 8760 hours.  CBG:  Recent Labs Lab 06/16/15 0346 06/16/15 0455 06/16/15 0600 06/16/15 0710 06/16/15 0808  GLUCAP 218* 228* 204* 220* 220*    Recent Results (from the past 240 hour(s))  Urine culture     Status: None (Preliminary result)   Collection Time: 06/15/15  5:45 PM  Result Value Ref Range Status   Specimen Description URINE, CATHETERIZED  Final   Special Requests NONE  Final   Culture   Final    NO GROWTH < 12 HOURS Performed at [facility]    Report Status PENDING  Incomplete  MRSA PCR Screening     Status: None   Collection Time: 06/15/15 10:06 PM  Result Value Ref Range Status   MRSA by PCR NEGATIVE NEGATIVE Final    Comment:        The GeneXpert MRSA Assay (FDA approved for NASAL specimens only), is one component of a comprehensive MRSA colonization surveillance program. It is not intended to diagnose MRSA infection nor to guide or monitor treatment for MRSA infections.      Studies: Dg Chest 2 View  06/15/2015  CLINICAL  DATA:  Upper chest pain, shortness of breath and weakness since waking up this morning. Initial encounter. EXAM: CHEST  2 VIEW COMPARISON:  PA and lateral chest 06/05/2015 and 03/22/2015. FINDINGS: The lungs are clear. There is no pneumothorax or pleural effusion. Heart size is normal. IMPRESSION: Negative chest. Electronically Signed   By: Drusilla Kanner M.D.   On: 06/15/2015 16:53    Scheduled Meds: . calcium gluconate  1 g Intravenous Once  . enoxaparin (LOVENOX) injection  40 mg Subcutaneous QHS  . piperacillin-tazobactam (ZOSYN)  IV  3.375 g Intravenous Q8H  . potassium chloride  40 mEq Oral Q4H  . potassium phosphate IVPB (mEq)  40 mEq Intravenous Once  . vancomycin  500 mg Intravenous Q12H   Continuous Infusions: . sodium chloride    . sodium chloride Stopped (06/16/15  0150)  . dextrose 5 % and 0.45% NaCl    . dextrose 5 % and 0.45% NaCl 125 mL/hr at 06/16/15 0800  . insulin (NOVOLIN-R) infusion 17.6 Units/hr (06/16/15 0808)     Time spent: 35 minutes    Cristyn Crossno  [facility] Pager [phone] If 7PM-7AM, please contact night-coverage at [email or web address], password [facility] 06/16/2015, 8:57 AM  LOS: 1 day

## 2015-06-17 DIAGNOSIS — E1010 Type 1 diabetes mellitus with ketoacidosis without coma: Secondary | ICD-10-CM | POA: Diagnosis present

## 2015-06-17 DIAGNOSIS — R509 Fever, unspecified: Secondary | ICD-10-CM | POA: Diagnosis present

## 2015-06-17 LAB — CBC WITH DIFFERENTIAL/PLATELET
Basophils Absolute: 0 10*3/uL (ref 0.0–0.1)
Basophils Relative: 0 %
EOS ABS: 0 10*3/uL (ref 0.0–0.7)
EOS PCT: 0 %
HCT: 32.7 % — ABNORMAL LOW (ref 39.0–52.0)
Hemoglobin: 11.9 g/dL — ABNORMAL LOW (ref 13.0–17.0)
LYMPHS ABS: 1 10*3/uL (ref 0.7–4.0)
LYMPHS PCT: 14 %
MCH: 30.5 pg (ref 26.0–34.0)
MCHC: 36.4 g/dL — AB (ref 30.0–36.0)
MCV: 83.8 fL (ref 78.0–100.0)
MONO ABS: 1 10*3/uL (ref 0.1–1.0)
Monocytes Relative: 14 %
Neutro Abs: 4.9 10*3/uL (ref 1.7–7.7)
Neutrophils Relative %: 72 %
PLATELETS: 205 10*3/uL (ref 150–400)
RBC: 3.9 MIL/uL — ABNORMAL LOW (ref 4.22–5.81)
RDW: 13 % (ref 11.5–15.5)
WBC: 6.9 10*3/uL (ref 4.0–10.5)

## 2015-06-17 LAB — COMPREHENSIVE METABOLIC PANEL
ALT: 21 U/L (ref 17–63)
ANION GAP: 8 (ref 5–15)
AST: 19 U/L (ref 15–41)
Albumin: 2.7 g/dL — ABNORMAL LOW (ref 3.5–5.0)
Alkaline Phosphatase: 66 U/L (ref 38–126)
BUN: 12 mg/dL (ref 6–20)
CHLORIDE: 115 mmol/L — AB (ref 101–111)
CO2: 14 mmol/L — ABNORMAL LOW (ref 22–32)
CREATININE: 1.06 mg/dL (ref 0.61–1.24)
Calcium: 7.1 mg/dL — ABNORMAL LOW (ref 8.9–10.3)
Glucose, Bld: 162 mg/dL — ABNORMAL HIGH (ref 65–99)
POTASSIUM: 3.3 mmol/L — AB (ref 3.5–5.1)
SODIUM: 137 mmol/L (ref 135–145)
Total Bilirubin: 0.5 mg/dL (ref 0.3–1.2)
Total Protein: 5.5 g/dL — ABNORMAL LOW (ref 6.5–8.1)

## 2015-06-17 LAB — PROCALCITONIN: Procalcitonin: 5.98 ng/mL

## 2015-06-17 LAB — GLUCOSE, CAPILLARY
GLUCOSE-CAPILLARY: 230 mg/dL — AB (ref 65–99)
GLUCOSE-CAPILLARY: 138 mg/dL — AB (ref 65–99)
GLUCOSE-CAPILLARY: 121 mg/dL — AB (ref 65–99)
Glucose-Capillary: 207 mg/dL — ABNORMAL HIGH (ref 65–99)

## 2015-06-17 LAB — URINE CULTURE: CULTURE: NO GROWTH

## 2015-06-17 LAB — HEMOGLOBIN A1C
Hgb A1c MFr Bld: 15.1 % — ABNORMAL HIGH (ref 4.8–5.6)
Mean Plasma Glucose: 387 mg/dL

## 2015-06-17 LAB — CALCIUM, IONIZED: CALCIUM, IONIZED, SERUM: 3.9 mg/dL — AB (ref 4.5–5.6)

## 2015-06-17 MED ORDER — POTASSIUM CHLORIDE CRYS ER 20 MEQ PO TBCR
40.0000 meq | EXTENDED_RELEASE_TABLET | ORAL | Status: AC
  Administered 2015-06-17 (×2): 40 meq via ORAL
  Filled 2015-06-17 (×2): qty 2

## 2015-06-17 MED ORDER — AMOXICILLIN-POT CLAVULANATE 875-125 MG PO TABS
1.0000 | ORAL_TABLET | Freq: Two times a day (BID) | ORAL | Status: DC
  Administered 2015-06-17 – 2015-06-18 (×3): 1 via ORAL
  Filled 2015-06-17 (×3): qty 1

## 2015-06-17 MED ORDER — SODIUM BICARBONATE 650 MG PO TABS
650.0000 mg | ORAL_TABLET | Freq: Two times a day (BID) | ORAL | Status: DC
  Administered 2015-06-17 (×2): 650 mg via ORAL
  Filled 2015-06-17 (×2): qty 1

## 2015-06-17 NOTE — Progress Notes (Signed)
Inpatient Diabetes Program Recommendations  AACE/ADA: New Consensus Statement on Inpatient Glycemic Control (2015)  Target Ranges:  Prepandial:   less than 140 mg/dL      Peak postprandial:   less than 180 mg/dL (1-2 hours)      Critically ill patients:  140 - 180 mg/dL   Review of Glycemic Control  Results for GRANGER, CHUI (MRN [id number]) as of 06/17/2015 13:36  Ref. Range 06/16/2015 16:00 06/16/2015 17:02 06/16/2015 18:12 06/16/2015 21:59 06/17/2015 08:13  Glucose-Capillary Latest Ref Range: 65-99 mg/dL 782 (H) 956 (H) 213 (H) 167 (H) 230 (H)  Results for JEFFREE, CAZEAU (MRN [id number]) as of 06/17/2015 13:36  Ref. Range 06/06/2015 02:40 06/15/2015 19:52  Hemoglobin A1C Latest Ref Range: 4.8-5.6 % 15.8 (H) 15.1 (H)   Long discussion with pt regarding his diabetes and home meds. Pt states a doctor told him to stop taking Lantus and that's the reason he was only taking Humalog 8 units tid. This writer explained to him that when he was discharged from [facility] on 1/16, he was discharged on Lantus 25 units QHS and Novolog (sub Humalog at [facility]) 8 units tidwc. Discussed how Type 1 diabetes requires both basal and bolus insulin, discussed importance of monitoring, hypoglycemia s/s and treatment, and f/u with MD. Pt voiced understanding. Instructed pt to give his own insulin while inpatient -  RN informed of above conversation and pt has agreed to checking blood sugars 4x/day at home.   Recommendations: Decrease Novolog to sensitive tidwc and hs Increase Novolog to 6 units tidwc for meal coverage insulin (Send home on 8 units tidwc)  Will f/u in am. Thank you. Ailene Ards, RD, LDN, CDE Inpatient Diabetes Coordinator [phone]

## 2015-06-17 NOTE — Progress Notes (Signed)
Date: June 17, 2015 Chart reviewed for concurrent status and case management needs. Will continue to follow patient for changes and needs:  Has appointment for the [facility] on Feb. 6,2017at4:45pm.  Patient made aware. Marcelle Smiling, BSN, CCM, RN,   [phone]

## 2015-06-17 NOTE — Progress Notes (Signed)
Initial Nutrition Assessment  DOCUMENTATION CODES:   Not applicable  INTERVENTION:  -No ONS interventions warranted at this time -Pt requires insulin education, will see diabetes coordinator prior to discharge -Pt does not require CHO Modified diet education at this point from my assessment  NUTRITION DIAGNOSIS:   Food and nutrition related knowledge deficit related to limited prior education, other (see comment) (limited understanding of prior education) as evidenced by per patient/family report.  GOAL:   Patient will meet greater than or equal to 90% of their needs  MONITOR:   PO intake, I & O's, Labs, Weight trends  REASON FOR ASSESSMENT:   Malnutrition Screening Tool    ASSESSMENT:   [name] [name] is a 30 y.o. male with a past medical history of type 1 diabetes, renal insufficiency who was brought to the [facility] emergency department by EMS due to shortness of breath and not feeling well.  Pt was recently admitted for DKA from 1/14-1/16 where he received diet education from RD there. In ED, found to have DKA again. Per CCM, pt did not take his full dose of basal insulin the night prior, was unable to get his sugars under control the following morning. Possibly septic as well. Per pt, when he was put on a flexpin utilizing novolog (or some other form of short-acting insulin, pt was unsure which one) , he was told to no longer take Lantus by practitioner. Pt is a little unsure of which practitioner told him to stop taking Lantus. He also expresses overall confusion with varying types of insulin and when to inject them.   Told patient to tell attending physician of his previous experiences and information he has been told, so that he can get the correct information and hopefully not be re-admitted to the hospital for this problem again. Encouraged pt to ask diabetes coordinator questions as well.  Nutrition-Focused physical exam completed. Findings are no fat  depletion, no muscle depletion, and no edema.   Diet Order:  Diet Carb Modified Fluid consistency:: Thin; Room service appropriate?: Yes  Skin:  Reviewed, no issues  Last BM:  PTA  Height:   Ht Readings from Last 1 Encounters:  06/15/15  (1.676 m)    Weight:   Wt Readings from Last 1 Encounters:  06/15/15 115 lb 4.8 oz (52.3 kg)    Ideal Body Weight:  64.54 kg  BMI:  Body mass index is 18.62 kg/(m^2).  Estimated Nutritional Needs:   Kcal:  1700-1900  Protein:  55-65 grams  Fluid:  >/= 1.7L  EDUCATION NEEDS:   Education needs addressed  Dionne Ano. Areona Homer, MS, RD LDN After Hours/Weekend Pager [phone]

## 2015-06-17 NOTE — Progress Notes (Addendum)
[facility] PROGRESS NOTE  Consuelo Thayne EAV:[id number] DOB: 1985-11-04 DOA: 06/15/2015 PCP: [name], MD  Brief narrative 30 year old male with type 1 diabetes mellitus with multiple hospitalization for DKA (10th hospitalization in the last 12 months, mostly for DKA), medication noncompliance and depression was discharged less than 10 days after being admitted for DKA was brought to the ED by EMS as patient reported not feeling well and having shortness of breath. Patient reported that he did not take his insulin on the day of admission and that his symptoms felt similar to his prior EKGs. In the ED patient was hypothermic, tachycardic and tachypneic with some confusion. In the ED he was severely acidotic with pH of 6.8, WBC of 19 K and blood glucose of 470. Bicarbonate was <7. Lactic acid was elevated to 2.4. UA and chest x-ray were unremarkable for infection. Patient was started on glucose stabilizer for DKA and sepsis pathway initiated in the ED. Critical care consulted. Patient admitted to stepdown unit on the hospitalist service.    Assessment/Plan: Type 1 diabetes mellitus with recurrent DKA's Presented with severe metabolic acidosis. Anion gap closed but still has low bicarbonate. -Has recurrent hospitalization for DKA in the setting of medication nonadherence. (Patient was hospitalized in November 2016 for DKA but never went back to the [facility] as a follow-up. Reports taking his Lantus and aspart as prescribed and his blood glucoses ranging from 250-375. The last time he saw his PCP was July 2016). -Diabetic coordinator consulted. Counseled extensively on diet and medication adherence along with close outpatient follow-up with PCP. Will schedule appointment prior to discharge. -Continue Lantus with pre-meal coverage. -Appreciate PCCM consult and follow-up.  Sepsis Patient hypothermic with leukocytosis on presentation. Still having some fever with negative cultures.  On empiric vancomycin and Zosyn. Possibly in the setting of severe DKA. No clear source of infection.  Sepsis has resolved. I will narrow antibiotics to Augmentin. -2-D echo was normal.  Acute kidney injury Secondary to severe dehydration. Resolved with IV fluids.  Metabolic acidosis  secondary to severe DKA. Bicarbonate slowly improving. Continue IV fluids. Add sodium bicarbonate tablet.  Pseudohyponatremia Associated with DKA. Corrected sodium normal.  Hypokalemia and hypomagnesemia ( 2.5 and 1.6) Replenished. Table in telemetry.  Hypocalcemia Received IV calcium gluconate. Improved.  Diet: Diabetic DVT prophylaxis: Subcutaneous Lovenox   Code Status: Full code Family Communication: None at bedside Disposition Plan: Transfer to medical floor. Discharge home tomorrow if blood glucose continues to be stable and remains afebrile.   Consultants:  PCCM  Procedures:  2-D echo  Antibiotics:  vanco and zosyn 1/24-1/26  augmentin 1/26  HPI/Subjective: Reports feeling tired. Reports that he was not able to get appointment with his PCP when he visited in the past.  Objective: Filed Vitals:   06/17/15 0800 06/17/15 0900  BP: 135/78   Pulse: 88 96  Temp: 98 F (36.7 C)   Resp: 18 14    Intake/Output Summary (Last 24 hours) at 06/17/15 1147 Last data filed at 06/17/15 0900  Gross per 24 hour  Intake 2710.27 ml  Output   4500 ml  Net -1789.73 ml   Filed Weights   06/15/15 1719 06/15/15 2300  Weight: 54.432 kg (120 lb) 52.3 kg (115 lb 4.8 oz)    Exam:   General:  Not in distress  HEENT: Moist mucosa  Cardiovascular: S1&S2 normal, no murmurs, rubs or gallop  Respiratory: clear b/l  Abdomen: soft, NT,ND, BS+  Musculoskeletal: warm, no edema,  CNS: Alert and  oriented  Data Reviewed: Basic Metabolic Panel:  Recent Labs Lab 06/15/15 1952 06/15/15 2300 06/16/15 0250 06/16/15 0450 06/16/15 0750 06/16/15 1556 06/17/15 0335  NA  --  135 139  --   137 136 137  K  --  3.6 3.2*  --  2.5* 2.5* 3.3*  CL  --  111 118*  --  117* 114* 115*  CO2  --  <7* <7*  --  11* 14* 14*  GLUCOSE  --  353* 228*  --  226* 178* 162*  BUN  --  27* 27*  --  23* 16 12  CREATININE  --  1.62* 1.30*  --  1.23 1.17 1.06  CALCIUM  --  6.7* 6.6*  --  6.3* 6.8* 7.1*  MG 2.0  --   --  1.6*  --   --   --   PHOS 4.2 3.0 1.0*  --   --   --   --    Liver Function Tests:  Recent Labs Lab 06/15/15 1515 06/16/15 0250 06/17/15 0335  AST ALT ALKPHOS 104 80 66  BILITOT 1.5* 1.7* 0.5  PROT 7.1 5.6* 5.5*  ALBUMIN 3.8 2.8* 2.7*   No results for input(s): LIPASE, AMYLASE in the last 168 hours. No results for input(s): AMMONIA in the last 168 hours. CBC:  Recent Labs Lab 06/15/15 1515 06/16/15 0250 06/17/15 0335  WBC 19.2* 11.3* 6.9  NEUTROABS 13.8* 8.8* 4.9  HGB 15.2 12.6* 11.9*  HCT 45.9 36.0* 32.7*  MCV 93.3 87.6 83.8  PLT 496* 279 205   Cardiac Enzymes:  Recent Labs Lab 06/15/15 2300  TROPONINI <0.03   BNP (last 3 results) No results for input(s): BNP in the last 8760 hours.  ProBNP (last 3 results) No results for input(s): PROBNP in the last 8760 hours.  CBG:  Recent Labs Lab 06/16/15 1600 06/16/15 1702 06/16/15 1812 06/16/15 2159 06/17/15 0813  GLUCAP 156* 149* 201* 167* 230*    Recent Results (from the past 240 hour(s))  Culture, blood (routine x 2)     Status: None (Preliminary result)   Collection Time: 06/15/15  3:20 PM  Result Value Ref Range Status   Specimen Description BLOOD RIGHT HAND  Final   Special Requests IN PEDIATRIC BOTTLE 1CC  Final   Culture   Final    NO GROWTH < 24 HOURS Performed at [facility]    Report Status PENDING  Incomplete  Culture, blood (routine x 2)     Status: None (Preliminary result)   Collection Time: 06/15/15  3:29 PM  Result Value Ref Range Status   Specimen Description BLOOD RIGHT HAND  Final   Special Requests IN PEDIATRIC BOTTLE 2CC  Final   Culture    Final    NO GROWTH < 24 HOURS Performed at [facility]    Report Status PENDING  Incomplete  Urine culture     Status: None   Collection Time: 06/15/15  5:45 PM  Result Value Ref Range Status   Specimen Description URINE, CATHETERIZED  Final   Special Requests NONE  Final   Culture   Final    NO GROWTH 2 DAYS Performed at [facility]    Report Status 06/17/2015 FINAL  Final  MRSA PCR Screening     Status: None   Collection Time: 06/15/15 10:06 PM  Result Value Ref Range Status   MRSA by PCR NEGATIVE NEGATIVE Final  Comment:        The GeneXpert MRSA Assay (FDA approved for NASAL specimens only), is one component of a comprehensive MRSA colonization surveillance program. It is not intended to diagnose MRSA infection nor to guide or monitor treatment for MRSA infections.      Studies: Dg Chest 2 View  06/15/2015  CLINICAL DATA:  Upper chest pain, shortness of breath and weakness since waking up this morning. Initial encounter. EXAM: CHEST  2 VIEW COMPARISON:  PA and lateral chest 06/05/2015 and 03/22/2015. FINDINGS: The lungs are clear. There is no pneumothorax or pleural effusion. Heart size is normal. IMPRESSION: Negative chest. Electronically Signed   By: Drusilla Kanner M.D.   On: 06/15/2015 16:53    Scheduled Meds: . enoxaparin (LOVENOX) injection  40 mg Subcutaneous QHS  . insulin aspart  0-15 Units Subcutaneous TID WC  . insulin aspart  0-5 Units Subcutaneous QHS  . insulin aspart  3 Units Subcutaneous TID WC  . insulin glargine  25 Units Subcutaneous Daily  . piperacillin-tazobactam (ZOSYN)  IV  3.375 g Intravenous Q8H  . potassium chloride  40 mEq Oral Q4H  . vancomycin  500 mg Intravenous Q12H   Continuous Infusions: . dextrose 5 % and 0.45% NaCl    . dextrose 5 % and 0.45% NaCl Stopped (06/16/15 1733)  . insulin (NOVOLIN-R) infusion Stopped (06/16/15 1920)  . sodium chloride 0.9 % 1,000 mL with potassium chloride 40 mEq infusion 100 mL/hr  at 06/17/15 0900     Time spent: 25 minutes    Eddie North  [facility] Pager [phone] If 7PM-7AM, please contact night-coverage at [email or web address], password [facility] 06/17/2015, 11:47 AM  LOS: 2 days

## 2015-06-17 NOTE — Progress Notes (Signed)
Name: [name] [name] MRN: [id number] DOB: [birth year]    ADMISSION DATE:  06/15/2015 CONSULTATION DATE:  06/15/15  REFERRING MD :  TRH  CHIEF COMPLAINT:  Weakness   HISTORY OF PRESENT ILLNESS:  [name] [name] is a 30 y.o. male with a PMH of DM 1 and renal insufficiency.  He has had multiple admissions for DKA, most recent being 06/05/15 through 06/07/15.  He presented to [facility] ED 01/24 after waking up that morning not feeling well with generalized weakness and some SOB.  He did not have any fevers/chills/sweats, headache, chest pain, cough, N/V/D, abd pain, myalgias. He is unable to describe what his home insulin regimen is supposed to be. Unclear if this is due to confusion or him just not knowing.   In ED, he was found to be in DKA once again. He was admitted to [facility] service and PCCM called in consultation.  Of note, last admission for DKA, pt reported that he had not taken his full dose of basal insulin the night prior and was unable to get sugars controlled the following morning when he noted sugars to be high.  This is what prompted him to go to ED.   SUBJECTIVE:  Feel fine  VITAL SIGNS: Temp:  [98.4 F (36.9 C)-100.6 F (38.1 C)] 98.7 F (37.1 C) (01/25 2100) Pulse Rate:  [89-103] 94 (01/26 0500) Resp:  [13-19] 13 (01/26 0500) BP: (103-136)/(62-94) 122/94 mmHg (01/26 0400) SpO2:  [98 %-100 %] 99 % (01/26 0500)  PHYSICAL EXAMINATION: General: No distress, talking and eating Neuro: Awake, alert, no distress HEENT: PERRL, no JVD Cardiovascular: RRR, no MRG Lungs: Clear bilateral breath sounds Abdomen: Soft, non-tender, non-distended Musculoskeletal: No acute deformity or ROM limitation Skin: Grossly intact   Recent Labs Lab 06/16/15 0750 06/16/15 1556 06/17/15 0335  NA 137 136 137  K 2.5* 2.5* 3.3*  CL 117* 114* 115*  CO2 11* 14* 14*  BUN 23* 16 12  CREATININE 1.23 1.17 1.06  GLUCOSE 226* 178* 162*    Recent Labs Lab 06/15/15 1515 06/16/15 0250 06/17/15 0335   HGB 15.2 12.6* 11.9*  HCT 45.9 36.0* 32.7*  WBC 19.2* 11.3* 6.9  PLT 496* 279 205   Dg Chest 2 View  06/15/2015  CLINICAL DATA:  Upper chest pain, shortness of breath and weakness since waking up this morning. Initial encounter. EXAM: CHEST  2 VIEW COMPARISON:  PA and lateral chest 06/05/2015 and 03/22/2015. FINDINGS: The lungs are clear. There is no pneumothorax or pleural effusion. Heart size is normal. IMPRESSION: Negative chest. Electronically Signed   By: Drusilla Kanner M.D.   On: 06/15/2015 16:53    SIGNIFICANT EVENTS  06/05/15 through 06/07/15 > admitted with DKA. 06/15/15 > back to ED with DKA.  STUDIES:  CXR 01/24 > no acute process.  MICRO: Blood 01/24 > Urine 01/24 > Sputum 01/24 >  ANTIBIOTICS: Vanc 01/24 > Zosyn 01/24 >   ASSESSMENT / PLAN:  DKA - of note, has had multiple admissions for this (2 in past 6 months; this being the 3rd). PCO2 within expected range. Protecting airway.    1/26 stable Plan: Insulin and fluids per DKA protocol.  Bicarb drip Transition to lantus later today Hourly CBG monitoring Replete electrolytes as indicated. Pt needs reeducation on diabetes management before discharge.  Concern for occult sepsis / sepsis of unclear etiology - hypothermia + leukocytosis. Plan: Empiric abx (vanc / zosyn). PCT is low. Can consider stopping abx if cultures are negative.   Pseudohyponatremia (in the setting of  hyperglycemia) - corrects to 138. (reolved) AGMA - DKA + mild lactate. AoC renal insufficiency.(resolved) ? Hypocalcemia - no albumin to correct for.   Recent Labs Lab 06/16/15 0750 06/16/15 1556 06/17/15 0335  NA 137 136 137   CBG (last 3)   Recent Labs  06/16/15 1702 06/16/15 1812 06/16/15 2159  GLUCAP 149* 201* 167*   Lab Results  Component Value Date   CREATININE 1.06 06/17/2015   CREATININE 1.17 06/16/2015   CREATININE 1.23 06/16/2015   CREATININE 0.85 10/09/2014      Plan: Fluids per DKA protocol. Replete  electrolytes as indicated. Lactic acid has normalized.  Frequent BMP's q4hrs.  Rest per primary team.  PCCM will sign off,please call if needed.  Brett Canales Lucion Dilger ACNP [facility] PCCM Pager [phone] till 3 pm If no answer page [phone] 06/17/2015, 7:44 AM

## 2015-06-18 DIAGNOSIS — R0682 Tachypnea, not elsewhere classified: Secondary | ICD-10-CM

## 2015-06-18 DIAGNOSIS — D72829 Elevated white blood cell count, unspecified: Secondary | ICD-10-CM

## 2015-06-18 DIAGNOSIS — R000 Tachycardia, unspecified: Secondary | ICD-10-CM

## 2015-06-18 DIAGNOSIS — E872 Acidosis, unspecified: Secondary | ICD-10-CM | POA: Insufficient documentation

## 2015-06-18 LAB — COMPREHENSIVE METABOLIC PANEL
ALK PHOS: 71 U/L (ref 38–126)
ALT: 18 U/L (ref 17–63)
ANION GAP: 5 (ref 5–15)
AST: 21 U/L (ref 15–41)
Albumin: 2.7 g/dL — ABNORMAL LOW (ref 3.5–5.0)
BILIRUBIN TOTAL: 0.7 mg/dL (ref 0.3–1.2)
BUN: 10 mg/dL (ref 6–20)
CALCIUM: 7.5 mg/dL — AB (ref 8.9–10.3)
CHLORIDE: 116 mmol/L — AB (ref 101–111)
CO2: 21 mmol/L — AB (ref 22–32)
CREATININE: 0.99 mg/dL (ref 0.61–1.24)
Glucose, Bld: 238 mg/dL — ABNORMAL HIGH (ref 65–99)
Potassium: 3.5 mmol/L (ref 3.5–5.1)
Sodium: 142 mmol/L (ref 135–145)
Total Protein: 5.3 g/dL — ABNORMAL LOW (ref 6.5–8.1)

## 2015-06-18 LAB — CBC WITH DIFFERENTIAL/PLATELET
Basophils Absolute: 0 10*3/uL (ref 0.0–0.1)
Basophils Relative: 0 %
Eosinophils Absolute: 0 10*3/uL (ref 0.0–0.7)
Eosinophils Relative: 1 %
HEMATOCRIT: 32 % — AB (ref 39.0–52.0)
HEMOGLOBIN: 11.5 g/dL — AB (ref 13.0–17.0)
LYMPHS ABS: 1 10*3/uL (ref 0.7–4.0)
Lymphocytes Relative: 28 %
MCH: 30.5 pg (ref 26.0–34.0)
MCHC: 35.9 g/dL (ref 30.0–36.0)
MCV: 84.9 fL (ref 78.0–100.0)
MONOS PCT: 16 %
Monocytes Absolute: 0.6 10*3/uL (ref 0.1–1.0)
NEUTROS ABS: 2 10*3/uL (ref 1.7–7.7)
NEUTROS PCT: 55 %
Platelets: 185 10*3/uL (ref 150–400)
RBC: 3.77 MIL/uL — ABNORMAL LOW (ref 4.22–5.81)
RDW: 13.5 % (ref 11.5–15.5)
WBC: 3.7 10*3/uL — ABNORMAL LOW (ref 4.0–10.5)

## 2015-06-18 LAB — GLUCOSE, CAPILLARY: GLUCOSE-CAPILLARY: 240 mg/dL — AB (ref 65–99)

## 2015-06-18 MED ORDER — "INSULIN SYRINGE-NEEDLE U-100 31G X 5/16"" 0.5 ML MISC": 1.0000 | Freq: Two times a day (BID) | Status: DC

## 2015-06-18 MED ORDER — INSULIN GLARGINE 100 UNIT/ML SOLOSTAR PEN: 25.0000 [IU] | PEN_INJECTOR | Freq: Every day | SUBCUTANEOUS | Status: DC

## 2015-06-18 MED ORDER — TRUEPLUS LANCETS 28G MISC: 1.0000 | Freq: Three times a day (TID) | Status: DC

## 2015-06-18 MED ORDER — AMOXICILLIN-POT CLAVULANATE 875-125 MG PO TABS: 1.0000 | ORAL_TABLET | Freq: Two times a day (BID) | ORAL | Status: AC

## 2015-06-18 MED FILL — LANTUS SOLOSTAR 100 UNITS/M: 100 | 33 days supply | Qty: 9 | Fill #0

## 2015-06-18 MED FILL — TRUEplus LANCETS 28G MISC: 33 days supply | Qty: 100 | Fill #0

## 2015-06-18 MED FILL — AMOX-CLAV 875-125 MG TABLET: 875-125 | 5 days supply | Qty: 10 | Fill #0

## 2015-06-18 MED FILL — ULTICARE PEN NDL 4MM 32G: 32G X 4 MM | 50 days supply | Qty: 100 | Fill #0

## 2015-06-18 NOTE — Discharge Summary (Signed)
Physician Discharge Summary  [name] [name] ZOX:[id number] DOB: 06-01-1985 DOA: 06/15/2015  PCP: [name], MD  Admit date: 06/15/2015 Discharge date: 06/18/2015  Time spent: 35 minutes  Recommendations for Outpatient Follow-up:    Discharge Diagnoses:  Uncontrolled type 1 diabetes mellitus with ketoacidosis without coma (HCC)  Active Problems:    Sepsis   Leukocytosis   Altered mental status   Abnormal EKG   Hypothermia   Sepsis (HCC)   Hypocalcemia   Acute renal failure (HCC)   Hypokalemia   Hypomagnesemia   Fever, unspecified      Discharge Condition: Fair  Diet recommendation: Diabetic  Filed Weights   06/15/15 1719 06/15/15 2300  Weight: 54.432 kg (120 lb) 52.3 kg (115 lb 4.8 oz)    History of present illness:  Please refer to admission H&P for details, in brief,30 year old male with type 1 diabetes mellitus with multiple hospitalization for DKA (10th hospitalization in the last 12 months, mostly for DKA), medication noncompliance and depression was discharged less than 10 days after being admitted for DKA was brought to the ED by EMS as patient reported not feeling well and having shortness of breath. Patient reported that he did not take his insulin on the day of admission and that his symptoms felt similar to his prior EKGs. In the ED patient was hypothermic, tachycardic and tachypneic with some confusion. In the ED he was severely acidotic with pH of 6.8, WBC of 19 K and blood glucose of 470. Bicarbonate was <7. Lactic acid was elevated to 2.4. UA and chest x-ray were unremarkable for infection. Patient was started on glucose stabilizer for DKA and sepsis pathway initiated in the ED. Critical care consulted. Patient admitted to stepdown unit on the hospitalist service.  Hospital Course:  Type 1 diabetes mellitus with recurrent DKA's Presented with severe  ketoacidosis. Admitted to stepdown on glucose stabilizer. Anion gap closed and patient placed on Lantus  with prenatal coverage. -Has recurrent hospitalization for DKA in the setting of medication nonadherence. (Patient was hospitalized in November 2016 for DKA but never went back to the [facility] as a follow-up. Reports taking his Lantus and aspart as prescribed and his blood glucoses ranging from 250-375. The last time he saw his PCP was July 2016). -Diabetic coordinator consulted. Counseled extensively on diet and medication adherence along with close outpatient follow-up with PCP. Scheduled appointment for 06/28/2015. Instructed clearly on keeping his appointment. Also instructed on keeping a log of his blood glucose readings and show 8 return his PCP. I have resumed his Lantus at 25 units daily and instructed him to continue pre-meal aspart 8 units 3 times a day.   Sepsis Patient hypothermic with leukocytosis on presentation. Had temperature spikes on the second day. Placed on empiric vancomycin and Zosyn. Versus possibly in the setting of severe DKA. source of infection and cultures negative. Sepsis now resolved. Narrowed antibiotic to oral Augmentin with plan to treat for total 7 days. 2-D echo done for persistent tachycardia showed normal EF with no wall motion abnormality. Returned to sinus rhythm   Acute kidney injury Secondary to severe dehydration. Resolved with IV fluids.  Metabolic acidosis  secondary to severe DKA. Bicarbonate now improved.  Pseudohyponatremia Associated with DKA. Corrected sodium normal.  Hypokalemia and hypomagnesemia ( 2.5 and 1.6) Replenished and normal. Stable on telemetry.   Hypocalcemia Received IV calcium gluconate. Improved.  Patient stable to be discharged home with outpatient PCP follow-up.  Procedures:  2-D echo  Consultations:  PC CM  Family communication: None at bedside Disposition: Home with outpatient follow-up  Discharge Exam: Filed Vitals:   06/17/15 2036 06/18/15 0633  BP: 132/84 137/87  Pulse: 97 87  Temp: 97.4 F (36.3  C) 98.4 F (36.9 C)  Resp: 18 18    General: Middle aged male not in distress HEENT: No pallor, moist mucosa Chest: Clear bilaterally.  CVS: Normal S1 and S2, no murmurs rub or gallop GI: Soft, nondistended, nontender, no sounds present Musculoskeletal: Warm, no edema  Discharge Instructions    Current Discharge Medication List    START taking these medications   Details  amoxicillin-clavulanate (AUGMENTIN) 875-125 MG tablet Take 1 tablet by mouth every 12 (twelve) hours. Qty: 10 tablet, Refills: 0      CONTINUE these medications which have CHANGED   Details  Insulin Glargine (LANTUS) 100 UNIT/ML Solostar Pen Inject 25 Units into the skin daily at 10 pm. Qty: 15 mL, Refills: 5    Insulin Syringe-Needle U-100 (B-D INS SYRINGE 0.5CC/31GX5/16) 31G X 5/16" 0.5 ML MISC 1 each by Does not apply route 2 (two) times daily. Qty: 60 each, Refills: 11   Associated Diagnoses: DM (diabetes mellitus), type 1, uncontrolled (HCC)    TRUEPLUS LANCETS 28G MISC 1 each by Does not apply route 3 (three) times daily. Qty: 100 each, Refills: 12      CONTINUE these medications which have NOT CHANGED   Details  !! insulin aspart (NOVOLOG FLEXPEN) 100 UNIT/ML FlexPen Sliding scale- 0-15 units TID with meals  CBG 70 - 120: 0 units CBG 121 - 150: 2 units CBG 151 - 200: 3 units CBG 201 - 250: 5 units CBG 251 - 300: 8 units CBG 301 - 350: 11 units CBG 351 - 400: 15 units CBG 70 - 120: 0 units CBG 121 - 150: 2 units CBG 151 - 200: 3 units CBG 201 - 250: 5 units CBG 251 - 300: 8 units CBG 301 - 350: 11 units CBG 351 - 400: 15 units Qty: 15 mL, Refills: 11    !! insulin aspart (NOVOLOG) 100 UNIT/ML FlexPen Inject 8 Units into the skin 3 (three) times daily with meals. Qty: 15 mL, Refills: 5     !! - Potential duplicate medications found. Please discuss with provider.     No Known Allergies Follow-up Information    Follow up with [facility]. Go on  06/27/2016.   Why:  4:45 pm   Contact information:   [address] [facility] [address] [phone]       The results of significant diagnostics from this hospitalization (including imaging, microbiology, ancillary and laboratory) are listed below for reference.    Significant Diagnostic Studies: Dg Chest 2 View  06/15/2015  CLINICAL DATA:  Upper chest pain, shortness of breath and weakness since waking up this morning. Initial encounter. EXAM: CHEST  2 VIEW COMPARISON:  PA and lateral chest 06/05/2015 and 03/22/2015. FINDINGS: The lungs are clear. There is no pneumothorax or pleural effusion. Heart size is normal. IMPRESSION: Negative chest. Electronically Signed   By: Drusilla Kanner M.D.   On: 06/15/2015 16:53   Dg Chest 2 View  06/05/2015  CLINICAL DATA:  Acute onset of shortness of breath and generalized weakness. Initial encounter. EXAM: CHEST  2 VIEW COMPARISON:  Chest radiograph performed 03/22/2015 FINDINGS: The lungs are well-aerated and clear. There is no evidence of focal opacification, pleural effusion or pneumothorax. The cardiomediastinal silhouette is within normal limits. No acute osseous  abnormalities are seen. IMPRESSION: No acute cardiopulmonary process seen. Electronically Signed   By: Roanna Raider M.D.   On: 06/05/2015 23:52    Microbiology: Recent Results (from the past 240 hour(s))  Culture, blood (routine x 2)     Status: None (Preliminary result)   Collection Time: 06/15/15  3:20 PM  Result Value Ref Range Status   Specimen Description BLOOD RIGHT HAND  Final   Special Requests IN PEDIATRIC BOTTLE 1CC  Final   Culture   Final    NO GROWTH 2 DAYS Performed at [facility]    Report Status PENDING  Incomplete  Culture, blood (routine x 2)     Status: None (Preliminary result)   Collection Time: 06/15/15  3:29 PM  Result Value Ref Range Status   Specimen Description BLOOD RIGHT HAND  Final   Special Requests IN PEDIATRIC BOTTLE  2CC  Final   Culture   Final    NO GROWTH 2 DAYS Performed at [facility]    Report Status PENDING  Incomplete  Urine culture     Status: None   Collection Time: 06/15/15  5:45 PM  Result Value Ref Range Status   Specimen Description URINE, CATHETERIZED  Final   Special Requests NONE  Final   Culture   Final    NO GROWTH 2 DAYS Performed at [facility]    Report Status 06/17/2015 FINAL  Final  MRSA PCR Screening     Status: None   Collection Time: 06/15/15 10:06 PM  Result Value Ref Range Status   MRSA by PCR NEGATIVE NEGATIVE Final    Comment:        The GeneXpert MRSA Assay (FDA approved for NASAL specimens only), is one component of a comprehensive MRSA colonization surveillance program. It is not intended to diagnose MRSA infection nor to guide or monitor treatment for MRSA infections.      Labs: Basic Metabolic Panel:  Recent Labs Lab 06/15/15 1952 06/15/15 2300 06/16/15 0250 06/16/15 0450 06/16/15 0750 06/16/15 1556 06/17/15 0335 06/18/15 0357  NA  --  135 139  --  137 136 137 142  K  --  3.6 3.2*  --  2.5* 2.5* 3.3* 3.5  CL  --  111 118*  --  117* 114* 115* 116*  CO2  --  <7* <7*  --  11* 14* 14* 21*  GLUCOSE  --  353* 228*  --  226* 178* 162* 238*  BUN  --  27* 27*  --  23* CREATININE  --  1.62* 1.30*  --  1.23 1.17 1.06 0.99  CALCIUM  --  6.7* 6.6*  --  6.3* 6.8* 7.1* 7.5*  MG 2.0  --   --  1.6*  --   --   --   --   PHOS 4.2 3.0 1.0*  --   --   --   --   --    Liver Function Tests:  Recent Labs Lab 06/15/15 1515 06/16/15 0250 06/17/15 0335 06/18/15 0357  AST ALT ALKPHOS 104 80 66 71  BILITOT 1.5* 1.7* 0.5 0.7  PROT 7.1 5.6* 5.5* 5.3*  ALBUMIN 3.8 2.8* 2.7* 2.7*   No results for input(s): LIPASE, AMYLASE in the last 168 hours. No results for input(s): AMMONIA in the last 168 hours. CBC:  Recent Labs Lab 06/15/15 1515 06/16/15 0250 06/17/15 0335 06/18/15 0357  WBC 19.2*  11.3*  6.9 3.7*  NEUTROABS 13.8* 8.8* 4.9 2.0  HGB 15.2 12.6* 11.9* 11.5*  HCT 45.9 36.0* 32.7* 32.0*  MCV 93.3 87.6 83.8 84.9  PLT 496* 279 205 185   Cardiac Enzymes:  Recent Labs Lab 06/15/15 2300  TROPONINI <0.03   BNP: BNP (last 3 results) No results for input(s): BNP in the last 8760 hours.  ProBNP (last 3 results) No results for input(s): PROBNP in the last 8760 hours.  CBG:  Recent Labs Lab 06/17/15 0813 06/17/15 1144 06/17/15 1702 06/17/15 2133 06/18/15 0742  GLUCAP 230* 138* 121* 207* 240*       Signed:  Eddie North MD.  [facility] 06/18/2015, 8:45 AM

## 2015-06-18 NOTE — Progress Notes (Signed)
Discharge instructions reviewed with patient, questions answered, verbalized understanding.  Patient verbalized understanding of his insulin dosages and that he will go to the community clinic and get his prescriptions filled.  Patient given bus pass and RN walked patient to bus stop in front of [facility].

## 2015-06-18 NOTE — Progress Notes (Signed)
This CM met with pt to ensure he has a plan for obtaining his insulin due to being Medicaid potential. Pt states he is in an insulin program at the [facility] and he plans to take his script there to obtain his insulin. Pt states that there his insulin is either free or at a cost he can afford. Discharge appointment was already made to [facility].  No other CM needs communicated. Marney Doctor RN,BSN,NCM [phone]

## 2015-06-18 NOTE — Discharge Instructions (Signed)
Blood Glucose Monitoring, Adult ° °Monitoring your blood glucose (also know as blood sugar) helps you to manage your diabetes. It also helps you and your health care provider monitor your diabetes and determine how well your treatment plan is working. °WHY SHOULD YOU MONITOR YOUR BLOOD GLUCOSE? °· It can help you understand how food, exercise, and medicine affect your blood glucose. °· It allows you to know what your blood glucose is at any given moment. You can quickly tell if you are having low blood glucose (hypoglycemia) or high blood glucose (hyperglycemia). °· It can help you and your health care provider know how to adjust your medicines. °· It can help you understand how to manage an illness or adjust medicine for exercise. °WHEN SHOULD YOU TEST? °Your health care provider will help you decide how often you should check your blood glucose. This may depend on the type of diabetes you have, your diabetes control, or the types of medicines you are taking. Be sure to write down all of your blood glucose readings so that this information can be reviewed with your health care provider. See below for examples of testing times that your health care provider may suggest. °Type 1 Diabetes °· Test at least 2 times per day if your diabetes is well controlled, if you are using an insulin pump, or if you perform multiple daily injections. °· If your diabetes is not well controlled or if you are sick, you may need to test more often. °· It is a good idea to also test: °¨ Before every insulin injection. °¨ Before and after exercise. °¨ Between meals and 2 hours after a meal. °¨ Occasionally between 2:00 a.m. and 3:00 a.m. °Type 2 Diabetes °· If you are taking insulin, test at least 2 times per day. However, it is best to test before every insulin injection. °· If you take medicines by mouth (orally), test 2 times a day. °· If you are on a controlled diet, test once a day. °· If your diabetes is not well controlled or if you  are sick, you may need to monitor more often. °HOW TO MONITOR YOUR BLOOD GLUCOSE °Supplies Needed °· Blood glucose meter. °· Test strips for your meter. Each meter has its own strips. You must use the strips that go with your own meter. °· A pricking needle (lancet). °· A device that holds the lancet (lancing device). °· A journal or log book to write down your results. °Procedure °· Wash your hands with soap and water. Alcohol is not preferred. °· Prick the side of your finger (not the tip) with the lancet. °· Gently milk the finger until a small drop of blood appears. °· Follow the instructions that come with your meter for inserting the test strip, applying blood to the strip, and using your blood glucose meter. °Other Areas to Get Blood for Testing °Some meters allow you to use other areas of your body (other than your finger) to test your blood. These areas are called alternative sites. The most common alternative sites are: °· The forearm. °· The thigh. °· The back area of the lower leg. °· The palm of the hand. °The blood flow in these areas is slower. Therefore, the blood glucose values you get may be delayed, and the numbers are different from what you would get from your fingers. Do not use alternative sites if you think you are having hypoglycemia. Your reading will not be accurate. Always use a finger if you   are having hypoglycemia. Also, if you cannot feel your lows (hypoglycemia unawareness), always use your fingers for your blood glucose checks. °ADDITIONAL TIPS FOR GLUCOSE MONITORING °· Do not reuse lancets. °· Always carry your supplies with you. °· All blood glucose meters have a 24-hour "hotline" number to call if you have questions or need help. °· Adjust (calibrate) your blood glucose meter with a control solution after finishing a few boxes of strips. °BLOOD GLUCOSE RECORD KEEPING °It is a good idea to keep a daily record or log of your blood glucose readings. Most glucose meters, if not all,  keep your glucose records stored in the meter. Some meters come with the ability to download your records to your home computer. Keeping a record of your blood glucose readings is especially helpful if you are wanting to look for patterns. Make notes to go along with the blood glucose readings because you might forget what happened at that exact time. Keeping good records helps you and your health care provider to work together to achieve good diabetes management.  °  °This information is not intended to replace advice given to you by your health care provider. Make sure you discuss any questions you have with your health care provider. °  °Document Released: 05/11/2003 Document Revised: 05/29/2014 Document Reviewed: 09/30/2012 °Elsevier Interactive Patient Education ©2016 Else[employer] ° °

## 2015-06-20 LAB — CULTURE, BLOOD (ROUTINE X 2)
CULTURE: NO GROWTH
Culture: NO GROWTH

## 2015-06-28 ENCOUNTER — Encounter: Payer: Self-pay | Admitting: Family Medicine

## 2015-06-28 ENCOUNTER — Ambulatory Visit: Payer: Self-pay | Attending: Family Medicine | Admitting: Family Medicine

## 2015-06-28 VITALS — BP 116/77 | HR 114 | Temp 99.1°F | Resp 16 | Wt 118.0 lb

## 2015-06-28 DIAGNOSIS — Z87891 Personal history of nicotine dependence: Secondary | ICD-10-CM | POA: Insufficient documentation

## 2015-06-28 DIAGNOSIS — E1010 Type 1 diabetes mellitus with ketoacidosis without coma: Secondary | ICD-10-CM | POA: Insufficient documentation

## 2015-06-28 DIAGNOSIS — Z794 Long term (current) use of insulin: Secondary | ICD-10-CM | POA: Insufficient documentation

## 2015-06-28 LAB — POCT URINALYSIS DIPSTICK
BILIRUBIN UA: NEGATIVE
Bilirubin, UA: NEGATIVE
GLUCOSE UA: 500
Glucose, UA: 500
KETONES UA: 40
KETONES UA: 40
LEUKOCYTES UA: NEGATIVE
Leukocytes, UA: NEGATIVE
Nitrite, UA: NEGATIVE
Nitrite, UA: NEGATIVE
PROTEIN UA: NEGATIVE
Protein, UA: NEGATIVE
RBC UA: NEGATIVE
Urobilinogen, UA: 0.2
Urobilinogen, UA: 0.2
pH, UA: 5.5
pH, UA: 5.5

## 2015-06-28 LAB — GLUCOSE, POCT (MANUAL RESULT ENTRY)
POC Glucose: 550 mg/dl — AB (ref 70–99)
POC Glucose: 486 mg/dl — AB (ref 70–99)

## 2015-06-28 MED ORDER — INSULIN GLARGINE 100 UNIT/ML SOLOSTAR PEN
25.0000 [IU] | PEN_INJECTOR | Freq: Every day | SUBCUTANEOUS | Status: DC
Start: 2015-06-28 — End: 2015-07-24

## 2015-06-28 MED ORDER — INSULIN ASPART 100 UNIT/ML FLEXPEN: 8.0000 [IU] | PEN_INJECTOR | Freq: Three times a day (TID) | SUBCUTANEOUS | Status: DC

## 2015-06-28 MED ORDER — INSULIN ASPART 100 UNIT/ML ~~LOC~~ SOLN
20.0000 [IU] | Freq: Once | SUBCUTANEOUS | Status: AC
  Administered 2015-06-28: 20 [IU] via SUBCUTANEOUS

## 2015-06-28 MED ORDER — TRUE METRIX METER W/DEVICE KIT: 1.0000 | PACK | Status: DC | PRN

## 2015-06-28 MED ORDER — GLUCOSE BLOOD VI STRP: 1.0000 | ORAL_STRIP | Freq: Three times a day (TID) | Status: DC

## 2015-06-28 NOTE — Patient Instructions (Addendum)
[name] [name] was seen today for hospitalization follow-up.  Diagnoses and all orders for this visit:  Uncontrolled type 1 diabetes mellitus with ketoacidosis without coma ([place]) -     Glucose (CBG) -     POCT urinalysis dipstick -     insulin aspart (novoLOG) injection 20 Units; Inject 0.2 mLs (20 Units total) into the skin once. -     POCT urinalysis dipstick -     POCT glucose (manual entry) -     Basic Metabolic Panel -     Insulin Glargine (LANTUS) 100 UNIT/ML Solostar Pen; Inject 25 Units into the skin daily at 10 pm. -     insulin aspart (NOVOLOG) 100 UNIT/ML FlexPen; Inject 8 Units into the skin 3 (three) times daily with meals. -     Blood Glucose Monitoring Suppl (TRUE METRIX METER) w/Device KIT; 1 each by Does not apply route as needed. -     glucose blood (TRUE METRIX BLOOD GLUCOSE TEST) test strip; 1 each by Other route 3 (three) times daily.   Diabetes blood sugar goals  Fasting (in AM before breakfast, 8 hrs of no eating or drinking (except water or unsweetened coffee or tea): 90-110 2 hrs after meals: < 160,   No low sugars: nothing < 70   Please apply for [facility] discount and orange card, you can also inquire if any of your medications are on the PASS (medications assistance) list.   Log sugars and bring meter and log to follow up appointment.   F/u with clinical pharmacologist in 2 weeks  F/u with me in 6 weeks for diabetes   Dr. Adrian Blackwater     Potassium Content of Foods Potassium is a mineral found in many foods and drinks. It helps keep fluids and minerals balanced in your body and affects how steadily your heart beats. Potassium also helps control your blood pressure and keep your muscles and nervous system healthy. Certain health conditions and medicines may change the balance of potassium in your body. When this happens, you can help balance your level of potassium through the foods that you do or do not eat. Your health care provider or dietitian may recommend  an amount of potassium that you should have each day. The following lists of foods provide the amount of potassium (in parentheses) per serving in each item. HIGH IN POTASSIUM  The following foods and beverages have 200 mg or more of potassium per serving:  Apricots, 2 raw or 5 dry (200 mg).  Artichoke, 1 medium (345 mg).  Avocado, raw,  each (245 mg).  Banana, 1 medium (425 mg).  Beans, lima, or baked beans, canned,  cup (280 mg).  Beans, white, canned,  cup (595 mg).  Beef roast, 3 oz (320 mg).  Beef, ground, 3 oz (270 mg).  Beets, raw or cooked,  cup (260 mg).  Bran muffin, 2 oz (300 mg).  Broccoli,  cup (230 mg).  Brussels sprouts,  cup (250 mg).  Cantaloupe,  cup (215 mg).  Cereal, 100% bran,  cup (200-400 mg).  Cheeseburger, single, fast food, 1 each (225-400 mg).  Chicken, 3 oz (220 mg).  Clams, canned, 3 oz (535 mg).  Crab, 3 oz (225 mg).  Dates, 5 each (270 mg).  Dried beans and peas,  cup (300-475 mg).  Figs, dried, 2 each (260 mg).  Fish: halibut, tuna, cod, snapper, 3 oz (480 mg).  Fish: salmon, haddock, swordfish, perch, 3 oz (300 mg).  Fish, tuna, canned 3 oz (  200 mg).  [place] fries, fast food, 3 oz (470 mg).  Granola with fruit and nuts,  cup (200 mg).  Grapefruit juice,  cup (200 mg).  Greens, beet,  cup (655 mg).  Honeydew melon,  cup (200 mg).  Kale, raw, 1 cup (300 mg).  Kiwi, 1 medium (240 mg).  Kohlrabi, rutabaga, parsnips,  cup (280 mg).  Lentils,  cup (365 mg).  Mango, 1 each (325 mg).  Milk, chocolate, 1 cup (420 mg).  Milk: nonfat, low-fat, whole, buttermilk, 1 cup (350-380 mg).  Molasses, 1 Tbsp (295 mg).  Mushrooms,  cup (280) mg.  Nectarine, 1 each (275 mg).  Nuts: almonds, peanuts, hazelnuts, [place], cashew, mixed, 1 oz (200 mg).  Nuts, pistachios, 1 oz (295 mg).  Orange, 1 each (240 mg).  Orange juice,  cup (235 mg).  Papaya, medium,  fruit (390 mg).  Peanut butter, chunky, 2  Tbsp (240 mg).  Peanut butter, smooth, 2 Tbsp (210 mg).  Pear, 1 medium (200 mg).  Pomegranate, 1 whole (400 mg).  Pomegranate juice,  cup (215 mg).  Pork, 3 oz (350 mg).  Potato chips, salted, 1 oz (465 mg).  Potato, baked with skin, 1 medium (925 mg).  Potatoes, boiled,  cup (255 mg).  Potatoes, mashed,  cup (330 mg).  Prune juice,  cup (370 mg).  Prunes, 5 each (305 mg).  Pudding, chocolate,  cup (230 mg).  Pumpkin, canned,  cup (250 mg).  Raisins, seedless,  cup (270 mg).  Seeds, sunflower or pumpkin, 1 oz (240 mg).  Soy milk, 1 cup (300 mg).  Spinach,  cup (420 mg).  Spinach, canned,  cup (370 mg).  Sweet potato, baked with skin, 1 medium (450 mg).  Swiss chard,  cup (480 mg).  Tomato or vegetable juice,  cup (275 mg).  Tomato sauce or puree,  cup (400-550 mg).  Tomato, raw, 1 medium (290 mg).  Tomatoes, canned,  cup (200-300 mg).  [place], 3 oz (250 mg).  Wheat germ, 1 oz (250 mg).  Winter squash,  cup (250 mg).  Yogurt, plain or fruited, 6 oz (260-435 mg).  Zucchini,  cup (220 mg). MODERATE IN POTASSIUM The following foods and beverages have 50-200 mg of potassium per serving:  Apple, 1 each (150 mg).  Apple juice,  cup (150 mg).  Applesauce,  cup (90 mg).  Apricot nectar,  cup (140 mg).  Asparagus, small spears,  cup or 6 spears (155 mg).  Bagel, cinnamon raisin, 1 each (130 mg).  Bagel, egg or plain, 4 in., 1 each (70 mg).  Beans, green,  cup (90 mg).  Beans, yellow,  cup (190 mg).  Beer, regular, 12 oz (100 mg).  Beets, canned,  cup (125 mg).  Blackberries,  cup (115 mg).  Blueberries,  cup (60 mg).  Bread, whole wheat, 1 slice (70 mg).  Broccoli, raw,  cup (145 mg).  Cabbage,  cup (150 mg).  Carrots, cooked or raw,  cup (180 mg).  Cauliflower, raw,  cup (150 mg).  Celery, raw,  cup (155 mg).  Cereal, bran flakes, cup (120-150 mg).  Cheese, cottage,  cup (110 mg).  Cherries,  10 each (150 mg).  Chocolate, 1 oz bar (165 mg).  Coffee, brewed 6 oz (90 mg).  Corn,  cup or 1 ear (195 mg).  Cucumbers,  cup (80 mg).  Egg, large, 1 each (60 mg).  Eggplant,  cup (60 mg).  Endive, raw, cup (80 mg).  English muffin, 1 each (65 mg).  Fish, orange roughy, 3 oz (150 mg).  Frankfurter, beef or pork, 1 each (75 mg).  Fruit cocktail,  cup (115 mg).  Grape juice,  cup (170 mg).  Grapefruit,  fruit (175 mg).  Grapes,  cup (155 mg).  Greens: kale, turnip, collard,  cup (110-150 mg).  Ice cream or frozen yogurt, chocolate,  cup (175 mg).  Ice cream or frozen yogurt, vanilla,  cup (120-150 mg).  Lemons, limes, 1 each (80 mg).  Lettuce, all types, 1 cup (100 mg).  Mixed vegetables,  cup (150 mg).  Mushrooms, raw,  cup (110 mg).  Nuts: walnuts, pecans, or macadamia, 1 oz (125 mg).  Oatmeal,  cup (80 mg).  Okra,  cup (110 mg).  Onions, raw,  cup (120 mg).  Peach, 1 each (185 mg).  Peaches, canned,  cup (120 mg).  Pears, canned,  cup (120 mg).  Peas, green, frozen,  cup (90 mg).  Peppers, green,  cup (130 mg).  Peppers, red,  cup (160 mg).  Pineapple juice,  cup (165 mg).  Pineapple, fresh or canned,  cup (100 mg).  Plums, 1 each (105 mg).  Pudding, vanilla,  cup (150 mg).  Raspberries,  cup (90 mg).  Rhubarb,  cup (115 mg).  Rice, wild,  cup (80 mg).  Shrimp, 3 oz (155 mg).  Spinach, raw, 1 cup (170 mg).  Strawberries,  cup (125 mg).  Summer squash  cup (175-200 mg).  Swiss chard, raw, 1 cup (135 mg).  Tangerines, 1 each (140 mg).  Tea, brewed, 6 oz (65 mg).  Turnips,  cup (140 mg).  Watermelon,  cup (85 mg).  Wine, red, table, 5 oz (180 mg).  Wine, white, table, 5 oz (100 mg). LOW IN POTASSIUM The following foods and beverages have less than 50 mg of potassium per serving.  Bread, white, 1 slice (30 mg).  Carbonated beverages, 12 oz (less than 5 mg).  Cheese, 1 oz (20-30  mg).  Cranberries,  cup (45 mg).  Cranberry juice cocktail,  cup (20 mg).  Fats and oils, 1 Tbsp (less than 5 mg).  Hummus, 1 Tbsp (32 mg).  Nectar: papaya, mango, or pear,  cup (35 mg).  Rice, white or brown,  cup (50 mg).  Spaghetti or macaroni,  cup cooked (30 mg).  Tortilla, flour or corn, 1 each (50 mg).  Waffle, 4 in., 1 each (50 mg).  Water chestnuts,  cup (40 mg).   This information is not intended to replace advice given to you by your health care provider. Make sure you discuss any questions you have with your health care provider.   Document Released: 12/20/2004 Document Revised: 05/13/2013 Document Reviewed: 04/04/2013 Elsevier Interactive Patient Education [employer].

## 2015-06-28 NOTE — Assessment & Plan Note (Addendum)
A; uncontrolled IDDM type 1 with hyperglycemia and ketones. Treated in office with 1 L NS and 20 U of novolog P: Increase lantus to 30 U nightly  Continue novolog 8 U TID Close follow up with clinical pharmacologist and with me for adjustments

## 2015-06-28 NOTE — Progress Notes (Signed)
Subjective:  Patient ID: [name] [name], male    DOB: [birth year]  Age: 30 y.o. MRN: [id number]  CC: Hospitalization Follow-up   HPI [name] [name] presents for    1. CHRONIC DIABETES dx at age 89. Recently hospitalized with DKA from 06/15/15 to 06/18/15. Uninsured.   Disease Monitoring  Blood Sugar Ranges: 200-550  Polyuria: yes   Visual problems: no   Medication Compliance: yes, 25  U of lantus nightly, 8 U of novolog TID most days  Medication Side Effects  Hypoglycemia: no   Preventitive Health Care  Eye Exam: due   Foot Exam: done today   Diet pattern: eats 3 meals a day when off, when working he works from 10 AM-5 PM or 4 PM-11 PM, he eats 1 or 2 meals these days.   Exercise: none   Social History  Substance Use Topics  . Smoking status: Former Smoker    Quit date: 09/19/2013  . Smokeless tobacco: Never Used  . Alcohol Use: No    Outpatient Prescriptions Prior to Visit  Medication Sig Dispense Refill  . insulin aspart (NOVOLOG FLEXPEN) 100 UNIT/ML FlexPen Sliding scale- 0-15 units TID with meals  CBG 70 - 120: 0 units CBG 121 - 150: 2 units CBG 151 - 200: 3 units CBG 201 - 250: 5 units CBG 251 - 300: 8 units CBG 301 - 350: 11 units CBG 351 - 400: 15 units CBG 70 - 120: 0 units CBG 121 - 150: 2 units CBG 151 - 200: 3 units CBG 201 - 250: 5 units CBG 251 - 300: 8 units CBG 301 - 350: 11 units CBG 351 - 400: 15 units 15 mL 11  . insulin aspart (NOVOLOG) 100 UNIT/ML FlexPen Inject 8 Units into the skin 3 (three) times daily with meals. 15 mL 5  . Insulin Glargine (LANTUS) 100 UNIT/ML Solostar Pen Inject 25 Units into the skin daily at 10 pm. 15 mL 5  . Insulin Syringe-Needle U-100 (B-D INS SYRINGE 0.5CC/31GX5/16) 31G X 5/16" 0.5 ML MISC 1 each by Does not apply route 2 (two) times daily. 60 each 11  . TRUEPLUS LANCETS 28G MISC 1 each by Does not apply route 3 (three) times daily. 100 each 12   No facility-administered medications prior to visit.     ROS Review of Systems  Constitutional: Negative for fever, chills, fatigue and unexpected weight change.  Eyes: Negative for visual disturbance.  Respiratory: Negative for cough and shortness of breath.   Cardiovascular: Negative for chest pain, palpitations and leg swelling.  Gastrointestinal: Negative for nausea, vomiting, abdominal pain, diarrhea, constipation and blood in stool.  Endocrine: Positive for polydipsia and polyphagia. Negative for polyuria.  Musculoskeletal: Negative for myalgias, back pain, arthralgias, gait problem and neck pain.  Skin: Negative for rash.  Allergic/Immunologic: Negative for immunocompromised state.  Hematological: Negative for adenopathy. Does not bruise/bleed easily.  Psychiatric/Behavioral: Negative for suicidal ideas, sleep disturbance and dysphoric mood. The patient is not nervous/anxious.     Objective:  BP 116/77 mmHg  Pulse 114  Temp(Src) 99.1 F (37.3 C) (Oral)  Resp 16  Wt 118 lb (53.524 kg)  SpO2 97%  BP/Weight 06/28/2015 06/18/2015 8/88/2800  Systolic BP 349 179 -  Diastolic BP 77 87 -  Wt. (Lbs) 118 - 115.3  BMI 19.05 - 18.62     Physical Exam  Constitutional: He appears well-developed and well-nourished. No distress.  HENT:  Head: Normocephalic and atraumatic.  Neck: Normal range of motion. Neck supple.  Cardiovascular:  Regular rhythm, normal heart sounds and intact distal pulses.  Tachycardia present.   Pulmonary/Chest: Effort normal and breath sounds normal.  Musculoskeletal: He exhibits no edema.  Neurological: He is alert.  Skin: Skin is warm and dry. No rash noted. No erythema.     Psychiatric: He has a normal mood and affect.    .   Chemistry      Component Value Date/Time   NA 142 06/18/2015 0357   K 3.5 06/18/2015 0357   CL 116* 06/18/2015 0357   CO2 21* 06/18/2015 0357   BUN 10 06/18/2015 0357   CREATININE 0.99 06/18/2015 0357   CREATININE 0.85 10/09/2014 1713      Component Value Date/Time    CALCIUM 7.5* 06/18/2015 0357   ALKPHOS 71 06/18/2015 0357   AST 21 06/18/2015 0357   ALT 18 06/18/2015 0357   BILITOT 0.7 06/18/2015 0357     Lab Results  Component Value Date   HGBA1C 15.1* 06/15/2015   CBG 550 UA: 40 ketones, 500 glucose   Treated with 1 L NS and 20 U of novolog  Repeat CBG 486  UA 40 ketones, 500 glucose   R great toenail clipped today  Assessment & Plan:  [name] [name] was seen today for hospitalization follow-up.  Diagnoses and all orders for this visit:  Uncontrolled type 1 diabetes mellitus with ketoacidosis without coma ([place]) -     Glucose (CBG) -     POCT urinalysis dipstick -     insulin aspart (novoLOG) injection 20 Units; Inject 0.2 mLs (20 Units total) into the skin once. -     POCT urinalysis dipstick -     POCT glucose (manual entry) -     Basic Metabolic Panel -     Insulin Glargine (LANTUS) 100 UNIT/ML Solostar Pen; Inject 25 Units into the skin daily at 10 pm. -     insulin aspart (NOVOLOG) 100 UNIT/ML FlexPen; Inject 8 Units into the skin 3 (three) times daily with meals. -     Blood Glucose Monitoring Suppl (TRUE METRIX METER) w/Device KIT; 1 each by Does not apply route as needed. -     glucose blood (TRUE METRIX BLOOD GLUCOSE TEST) test strip; 1 each by Other route 3 (three) times daily.

## 2015-06-28 NOTE — Progress Notes (Signed)
HFU elevated glucose Elevated glucose today. Pt stated took Novolog 8 at 1:30, and 9:00 No tobacco user  No suicidal thought in the past two weeks

## 2015-06-29 ENCOUNTER — Telehealth: Payer: Self-pay | Admitting: Family Medicine

## 2015-06-29 ENCOUNTER — Other Ambulatory Visit: Payer: Self-pay | Admitting: Family Medicine

## 2015-06-29 DIAGNOSIS — E1010 Type 1 diabetes mellitus with ketoacidosis without coma: Secondary | ICD-10-CM

## 2015-06-29 LAB — BASIC METABOLIC PANEL
BUN: 18 mg/dL (ref 7–25)
CO2: 19 mmol/L — ABNORMAL LOW (ref 20–31)
CREATININE: 0.9 mg/dL (ref 0.60–1.35)
Calcium: 8.5 mg/dL — ABNORMAL LOW (ref 8.6–10.3)
Chloride: 105 mmol/L (ref 98–110)
GLUCOSE: 533 mg/dL — AB (ref 65–99)
POTASSIUM: 4.7 mmol/L (ref 3.5–5.3)
Sodium: 135 mmol/L (ref 135–146)

## 2015-06-29 MED ORDER — INSULIN ASPART 100 UNIT/ML FLEXPEN: 10.0000 [IU] | PEN_INJECTOR | Freq: Three times a day (TID) | SUBCUTANEOUS | Status: DC

## 2015-06-29 NOTE — Telephone Encounter (Signed)
Patient called  Patient with hyperglycemia (high sugar)  and CO2 (acid base level) downtrending (becoming more acidic) from hospital stay Headed back to DKA if he does not control his sugars at home Advised f/u in one week with clinical pharmacologist Increase novolog to 10 U TID as well

## 2015-06-29 NOTE — Telephone Encounter (Signed)
Patient called Verified name and DOB Gave lab results CBG high, bicarb 19  Plan to increase novolog to 10 U TID He was not able to pick up meter yesterday from pharmacy He is asked to come in today When he comes in today he is to schedule f/u for next week instead of in 2 weeks with Misty Stanley for diabetes

## 2015-07-06 ENCOUNTER — Encounter (HOSPITAL_COMMUNITY): Payer: Self-pay

## 2015-07-06 ENCOUNTER — Emergency Department (HOSPITAL_COMMUNITY)
Admission: EM | Admit: 2015-07-06 | Discharge: 2015-07-06 | Disposition: A | Discharge status: Home / Self Care | Payer: No Typology Code available for payment source | Attending: Emergency Medicine | Admitting: Emergency Medicine

## 2015-07-06 DIAGNOSIS — Y92410 Unspecified street and highway as the place of occurrence of the external cause: Secondary | ICD-10-CM | POA: Insufficient documentation

## 2015-07-06 DIAGNOSIS — S3992XA Unspecified injury of lower back, initial encounter: Secondary | ICD-10-CM | POA: Insufficient documentation

## 2015-07-06 DIAGNOSIS — Z794 Long term (current) use of insulin: Secondary | ICD-10-CM | POA: Diagnosis not present

## 2015-07-06 DIAGNOSIS — Z87448 Personal history of other diseases of urinary system: Secondary | ICD-10-CM | POA: Diagnosis not present

## 2015-07-06 DIAGNOSIS — M7918 Myalgia, other site: Secondary | ICD-10-CM

## 2015-07-06 DIAGNOSIS — S20212A Contusion of left front wall of thorax, initial encounter: Secondary | ICD-10-CM | POA: Diagnosis not present

## 2015-07-06 DIAGNOSIS — Z87891 Personal history of nicotine dependence: Secondary | ICD-10-CM | POA: Diagnosis not present

## 2015-07-06 DIAGNOSIS — Y9389 Activity, other specified: Secondary | ICD-10-CM | POA: Diagnosis not present

## 2015-07-06 DIAGNOSIS — Y998 Other external cause status: Secondary | ICD-10-CM | POA: Insufficient documentation

## 2015-07-06 DIAGNOSIS — E119 Type 2 diabetes mellitus without complications: Secondary | ICD-10-CM | POA: Insufficient documentation

## 2015-07-06 DIAGNOSIS — S29001A Unspecified injury of muscle and tendon of front wall of thorax, initial encounter: Secondary | ICD-10-CM | POA: Diagnosis present

## 2015-07-06 MED ORDER — ACETAMINOPHEN 500 MG PO TABS
1000.0000 mg | ORAL_TABLET | Freq: Once | ORAL | Status: AC
  Administered 2015-07-06: 1000 mg via ORAL
  Filled 2015-07-06: qty 2

## 2015-07-06 MED ORDER — METHOCARBAMOL 500 MG PO TABS
500.0000 mg | ORAL_TABLET | Freq: Once | ORAL | Status: AC
  Administered 2015-07-06: 500 mg via ORAL
  Filled 2015-07-06: qty 1

## 2015-07-06 MED ORDER — METHOCARBAMOL 500 MG PO TABS: 1000.0000 mg | ORAL_TABLET | Freq: Four times a day (QID) | ORAL | Status: DC | PRN
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
## 2015-07-06 NOTE — Discharge Instructions (Signed)
For pain control you may   tylenol (acetaminophen)  (this is 3 over the counter pills) four times a day. Do not drink alcohol or combine with other medications that have acetaminophen as an ingredient (Read the labels!).    For breakthrough pain you may take Robaxin. Do not drink alcohol, drive or operate heavy machinery when taking Robaxin.  It is very important that you take deep breaths to prevent lung collapse and infection.  Either use your incentive spirometer or take 10 deep breaths every hour to prevent lung collapse.  If you develop cough, fever or shortness of breath return immediately to the emergency room.    Please follow with your primary care doctor in the next 2 days for a check-up. They must obtain records for further management.   Do not hesitate to return to the Emergency Department for any new, worsening or concerning symptoms.    Chest Contusion A chest contusion is a deep bruise on your chest area. Contusions are the result of an injury that caused bleeding under the skin. A chest contusion may involve bruising of the skin, muscles, or ribs. The contusion may turn blue, purple, or yellow. Minor injuries will give you a painless contusion, but more severe contusions may stay painful and swollen for a few weeks. CAUSES  A contusion is usually caused by a blow, trauma, or direct force to an area of the body. SYMPTOMS   Swelling and redness of the injured area.  Discoloration of the injured area.  Tenderness and soreness of the injured area.  Pain. DIAGNOSIS  The diagnosis can be made by taking a history and performing a physical exam. An X-ray, CT scan, or MRI may be needed to determine if there were any associated injuries, such as broken bones (fractures) or internal injuries. TREATMENT  Often, the best treatment for a chest contusion is resting, icing, and applying cold compresses to the injured area. Deep breathing exercises may be recommended to reduce the  risk of pneumonia. Over-the-counter medicines may also be recommended for pain control. HOME CARE INSTRUCTIONS   Put ice on the injured area.  Put ice in a plastic bag.  Place a towel between your skin and the bag.  Leave the ice on for 15-20 minutes, 03-04 times a day.  Only take over-the-counter or prescription medicines as directed by your caregiver. Your caregiver may recommend avoiding anti-inflammatory medicines (aspirin, ibuprofen, and naproxen) for 48 hours because these medicines may increase bruising.  Rest the injured area.  Perform deep-breathing exercises as directed by your caregiver.  Stop smoking if you smoke.  Do not lift objects over 5 pounds (2.3 kg) for 3 days or longer if recommended by your caregiver. SEEK IMMEDIATE MEDICAL CARE IF:   You have increased bruising or swelling.  You have pain that is getting worse.  You have difficulty breathing.  You have dizziness, weakness, or fainting.  You have blood in your urine or stool.  You cough up or vomit blood.  Your swelling or pain is not relieved with medicines. MAKE SURE YOU:   Understand these instructions.  Will watch your condition.  Will get help right away if you are not doing well or get worse.   This information is not intended to replace advice given to you by your health care provider. Make sure you discuss any questions you have with your health care provider.   Document Released: 01/31/2001 Document Revised: 01/31/2012 Document Reviewed: 10/30/2011 Elsevier Interactive Patient Education 2016 Elsevier  Inc.

## 2015-07-13 ENCOUNTER — Encounter: Payer: Self-pay | Admitting: Pharmacist

## 2015-07-21 ENCOUNTER — Ambulatory Visit: Payer: Self-pay | Admitting: Pharmacist

## 2015-07-22 ENCOUNTER — Emergency Department (HOSPITAL_COMMUNITY): Payer: Self-pay

## 2015-07-22 ENCOUNTER — Ambulatory Visit: Payer: Self-pay | Admitting: Pharmacist

## 2015-07-22 ENCOUNTER — Encounter (HOSPITAL_COMMUNITY): Payer: Self-pay | Admitting: Emergency Medicine

## 2015-07-22 ENCOUNTER — Inpatient Hospital Stay (HOSPITAL_COMMUNITY)
Admission: EM | Admit: 2015-07-22 | Discharge: 2015-07-24 | DRG: 638 | Disposition: A | Discharge status: Home / Self Care | Payer: Self-pay | Attending: Internal Medicine | Admitting: Internal Medicine

## 2015-07-22 DIAGNOSIS — Z79899 Other long term (current) drug therapy: Secondary | ICD-10-CM

## 2015-07-22 DIAGNOSIS — E1110 Type 2 diabetes mellitus with ketoacidosis without coma: Secondary | ICD-10-CM | POA: Diagnosis present

## 2015-07-22 DIAGNOSIS — R030 Elevated blood-pressure reading, without diagnosis of hypertension: Secondary | ICD-10-CM | POA: Diagnosis present

## 2015-07-22 DIAGNOSIS — Z9114 Patient's other noncompliance with medication regimen: Secondary | ICD-10-CM

## 2015-07-22 DIAGNOSIS — R7989 Other specified abnormal findings of blood chemistry: Secondary | ICD-10-CM | POA: Diagnosis present

## 2015-07-22 DIAGNOSIS — E1159 Type 2 diabetes mellitus with other circulatory complications: Secondary | ICD-10-CM | POA: Diagnosis present

## 2015-07-22 DIAGNOSIS — I152 Hypertension secondary to endocrine disorders: Secondary | ICD-10-CM | POA: Diagnosis present

## 2015-07-22 DIAGNOSIS — I10 Essential (primary) hypertension: Secondary | ICD-10-CM | POA: Diagnosis present

## 2015-07-22 DIAGNOSIS — E860 Dehydration: Secondary | ICD-10-CM | POA: Diagnosis present

## 2015-07-22 DIAGNOSIS — F1290 Cannabis use, unspecified, uncomplicated: Secondary | ICD-10-CM | POA: Insufficient documentation

## 2015-07-22 DIAGNOSIS — E781 Pure hyperglyceridemia: Secondary | ICD-10-CM | POA: Diagnosis present

## 2015-07-22 DIAGNOSIS — K5900 Constipation, unspecified: Secondary | ICD-10-CM | POA: Diagnosis present

## 2015-07-22 DIAGNOSIS — R0682 Tachypnea, not elsewhere classified: Secondary | ICD-10-CM | POA: Diagnosis present

## 2015-07-22 DIAGNOSIS — E1010 Type 1 diabetes mellitus with ketoacidosis without coma: Principal | ICD-10-CM | POA: Diagnosis present

## 2015-07-22 DIAGNOSIS — E785 Hyperlipidemia, unspecified: Secondary | ICD-10-CM | POA: Diagnosis present

## 2015-07-22 DIAGNOSIS — Z87891 Personal history of nicotine dependence: Secondary | ICD-10-CM

## 2015-07-22 DIAGNOSIS — N179 Acute kidney failure, unspecified: Secondary | ICD-10-CM | POA: Diagnosis present

## 2015-07-22 DIAGNOSIS — Z833 Family history of diabetes mellitus: Secondary | ICD-10-CM

## 2015-07-22 DIAGNOSIS — R809 Proteinuria, unspecified: Secondary | ICD-10-CM

## 2015-07-22 DIAGNOSIS — Z794 Long term (current) use of insulin: Secondary | ICD-10-CM

## 2015-07-22 DIAGNOSIS — E1029 Type 1 diabetes mellitus with other diabetic kidney complication: Secondary | ICD-10-CM | POA: Diagnosis present

## 2015-07-22 LAB — CBC WITH DIFFERENTIAL/PLATELET
BASOS ABS: 0 10*3/uL (ref 0.0–0.1)
BASOS PCT: 0 %
EOS ABS: 0 10*3/uL (ref 0.0–0.7)
EOS PCT: 0 %
HCT: 47.9 % (ref 39.0–52.0)
Hemoglobin: 17.2 g/dL — ABNORMAL HIGH (ref 13.0–17.0)
Lymphocytes Relative: 10 %
Lymphs Abs: 0.6 10*3/uL — ABNORMAL LOW (ref 0.7–4.0)
MCH: 31.9 pg (ref 26.0–34.0)
MCHC: 35.9 g/dL (ref 30.0–36.0)
MCV: 88.7 fL (ref 78.0–100.0)
MONO ABS: 0.2 10*3/uL (ref 0.1–1.0)
Monocytes Relative: 3 %
Neutro Abs: 5.1 10*3/uL (ref 1.7–7.7)
Neutrophils Relative %: 87 %
PLATELETS: 253 10*3/uL (ref 150–400)
RBC: 5.4 MIL/uL (ref 4.22–5.81)
RDW: 13.5 % (ref 11.5–15.5)
WBC: 5.9 10*3/uL (ref 4.0–10.5)

## 2015-07-22 LAB — BASIC METABOLIC PANEL
Anion gap: 24 — ABNORMAL HIGH (ref 5–15)
Anion gap: 17 — ABNORMAL HIGH (ref 5–15)
Anion gap: 15 (ref 5–15)
BUN: 16 mg/dL (ref 6–20)
BUN: 15 mg/dL (ref 6–20)
BUN: 17 mg/dL (ref 6–20)
CHLORIDE: 106 mmol/L (ref 101–111)
CHLORIDE: 112 mmol/L — AB (ref 101–111)
CHLORIDE: 112 mmol/L — AB (ref 101–111)
CO2: 7 mmol/L — AB (ref 22–32)
CO2: 8 mmol/L — AB (ref 22–32)
CO2: 13 mmol/L — ABNORMAL LOW (ref 22–32)
CREATININE: 1.54 mg/dL — AB (ref 0.61–1.24)
CREATININE: 1.19 mg/dL (ref 0.61–1.24)
CREATININE: 1.08 mg/dL (ref 0.61–1.24)
Calcium: 8.7 mg/dL — ABNORMAL LOW (ref 8.9–10.3)
Calcium: 8.4 mg/dL — ABNORMAL LOW (ref 8.9–10.3)
Calcium: 8.5 mg/dL — ABNORMAL LOW (ref 8.9–10.3)
GFR calc Af Amer: >60 mL/min (ref >60)
GFR calc Af Amer: >60 mL/min (ref >60)
GFR calc Af Amer: >60 mL/min (ref >60)
GFR calc non Af Amer: 59 mL/min — ABNORMAL LOW (ref >60)
GFR calc non Af Amer: >60 mL/min (ref >60)
GLUCOSE: 178 mg/dL — AB (ref 65–99)
GLUCOSE: 168 mg/dL — AB (ref 65–99)
Glucose, Bld: 295 mg/dL — ABNORMAL HIGH (ref 65–99)
Potassium: 3.6 mmol/L (ref 3.5–5.1)
Potassium: 3.8 mmol/L (ref 3.5–5.1)
Potassium: 3.5 mmol/L (ref 3.5–5.1)
Sodium: 137 mmol/L (ref 135–145)
Sodium: 137 mmol/L (ref 135–145)
Sodium: 140 mmol/L (ref 135–145)

## 2015-07-22 LAB — URINALYSIS, ROUTINE W REFLEX MICROSCOPIC
BILIRUBIN URINE: NEGATIVE
Glucose, UA: >1000 mg/dL — AB
Leukocytes, UA: NEGATIVE
NITRITE: NEGATIVE
Protein, ur: 30 mg/dL — AB
SPECIFIC GRAVITY, URINE: 1.024 (ref 1.005–1.030)
pH: 5 (ref 5.0–8.0)

## 2015-07-22 LAB — I-STAT CHEM 8, ED
BUN: 22 mg/dL — ABNORMAL HIGH (ref 6–20)
CALCIUM ION: 1.18 mmol/L (ref 1.12–1.23)
CHLORIDE: 104 mmol/L (ref 101–111)
Creatinine, Ser: 0.9 mg/dL (ref 0.61–1.24)
GLUCOSE: 558 mg/dL — AB (ref 65–99)
HEMATOCRIT: 57 % — AB (ref 39.0–52.0)
HEMOGLOBIN: 19.4 g/dL — AB (ref 13.0–17.0)
Potassium: 3.6 mmol/L (ref 3.5–5.1)
SODIUM: 131 mmol/L — AB (ref 135–145)
TCO2: 6 mmol/L (ref 0–100)

## 2015-07-22 LAB — I-STAT VENOUS BLOOD GAS, ED
ACID-BASE DEFICIT: 25 mmol/L — AB (ref 0.0–2.0)
Bicarbonate: 4.4 mEq/L — ABNORMAL LOW (ref 20.0–24.0)
O2 SAT: 60 %
PH VEN: 7.001 — AB (ref 7.250–7.300)
PO2 VEN: 45 mmHg (ref 30.0–45.0)
TCO2: <5 mmol/L (ref 0–100)
pCO2, Ven: 17.9 mmHg — ABNORMAL LOW (ref 45.0–50.0)

## 2015-07-22 LAB — COMPREHENSIVE METABOLIC PANEL
ALBUMIN: 4.7 g/dL (ref 3.5–5.0)
ALT: 22 U/L (ref 17–63)
AST: 24 U/L (ref 15–41)
Alkaline Phosphatase: 100 U/L (ref 38–126)
BUN: 20 mg/dL (ref 6–20)
CALCIUM: 9 mg/dL (ref 8.9–10.3)
CHLORIDE: 98 mmol/L — AB (ref 101–111)
CO2: <7 mmol/L — ABNORMAL LOW (ref 22–32)
CREATININE: 1.81 mg/dL — AB (ref 0.61–1.24)
GFR, EST AFRICAN AMERICAN: 56 mL/min — AB (ref >60)
GFR, EST NON AFRICAN AMERICAN: 48 mL/min — AB (ref >60)
Glucose, Bld: 571 mg/dL (ref 65–99)
Potassium: 3.8 mmol/L (ref 3.5–5.1)
Sodium: 129 mmol/L — ABNORMAL LOW (ref 135–145)
TOTAL PROTEIN: 8.4 g/dL — AB (ref 6.5–8.1)
Total Bilirubin: 1.7 mg/dL — ABNORMAL HIGH (ref 0.3–1.2)

## 2015-07-22 LAB — URINE MICROSCOPIC-ADD ON

## 2015-07-22 LAB — CBG MONITORING, ED
Glucose-Capillary: 544 mg/dL — ABNORMAL HIGH (ref 65–99)
Glucose-Capillary: 362 mg/dL — ABNORMAL HIGH (ref 65–99)
Glucose-Capillary: 319 mg/dL — ABNORMAL HIGH (ref 65–99)
Glucose-Capillary: 296 mg/dL — ABNORMAL HIGH (ref 65–99)
Glucose-Capillary: 203 mg/dL — ABNORMAL HIGH (ref 65–99)
Glucose-Capillary: 162 mg/dL — ABNORMAL HIGH (ref 65–99)
Glucose-Capillary: 170 mg/dL — ABNORMAL HIGH (ref 65–99)
Glucose-Capillary: 165 mg/dL — ABNORMAL HIGH (ref 65–99)

## 2015-07-22 LAB — LIPID PANEL
CHOL/HDL RATIO: 5 ratio
CHOLESTEROL: 257 mg/dL — AB (ref 0–200)
HDL: 51 mg/dL (ref >40)
LDL Cholesterol: UNDETERMINED mg/dL (ref 0–99)
Triglycerides: 402 mg/dL — ABNORMAL HIGH (ref <150)
VLDL: UNDETERMINED mg/dL (ref 0–40)

## 2015-07-22 LAB — MAGNESIUM: MAGNESIUM: 2.1 mg/dL (ref 1.7–2.4)

## 2015-07-22 LAB — I-STAT CG4 LACTIC ACID, ED: LACTIC ACID, VENOUS: 1.73 mmol/L (ref 0.5–2.0)

## 2015-07-22 LAB — RAPID URINE DRUG SCREEN, HOSP PERFORMED
AMPHETAMINES: NOT DETECTED
BENZODIAZEPINES: NOT DETECTED
Barbiturates: NOT DETECTED
COCAINE: NOT DETECTED
Opiates: NOT DETECTED
Tetrahydrocannabinol: POSITIVE — AB

## 2015-07-22 LAB — PROTEIN / CREATININE RATIO, URINE
CREATININE, URINE: 26.19 mg/dL
Protein Creatinine Ratio: 1.87 mg/mg{Cre} — ABNORMAL HIGH (ref 0.00–0.15)
Total Protein, Urine: 49 mg/dL

## 2015-07-22 LAB — GLUCOSE, CAPILLARY
GLUCOSE-CAPILLARY: 165 mg/dL — AB (ref 65–99)
GLUCOSE-CAPILLARY: 167 mg/dL — AB (ref 65–99)
Glucose-Capillary: 148 mg/dL — ABNORMAL HIGH (ref 65–99)
Glucose-Capillary: 152 mg/dL — ABNORMAL HIGH (ref 65–99)

## 2015-07-22 MED ORDER — POTASSIUM CHLORIDE 10 MEQ/100ML IV SOLN
10.0000 meq | INTRAVENOUS | Status: AC
  Administered 2015-07-22 (×3): 10 meq via INTRAVENOUS
  Filled 2015-07-22 (×4): qty 100

## 2015-07-22 MED ORDER — ACETAMINOPHEN 325 MG PO TABS: 650.0000 mg | ORAL_TABLET | Freq: Four times a day (QID) | ORAL | Status: DC | PRN

## 2015-07-22 MED ORDER — ONDANSETRON HCL 4 MG PO TABS: 4.0000 mg | ORAL_TABLET | Freq: Four times a day (QID) | ORAL | Status: DC | PRN

## 2015-07-22 MED ORDER — ENOXAPARIN SODIUM 40 MG/0.4ML ~~LOC~~ SOLN
40.0000 mg | SUBCUTANEOUS | Status: DC
  Administered 2015-07-22: 40 mg via SUBCUTANEOUS
  Filled 2015-07-22 (×2): qty 0.4

## 2015-07-22 MED ORDER — METHOCARBAMOL 500 MG PO TABS: 1000.0000 mg | ORAL_TABLET | Freq: Four times a day (QID) | ORAL | Status: DC | PRN

## 2015-07-22 MED ORDER — DEXTROSE-NACL 5-0.45 % IV SOLN
INTRAVENOUS | Status: DC
  Administered 2015-07-22 – 2015-07-23 (×4): via INTRAVENOUS

## 2015-07-22 MED ORDER — ACETAMINOPHEN 650 MG RE SUPP: 650.0000 mg | Freq: Four times a day (QID) | RECTAL | Status: DC | PRN

## 2015-07-22 MED ORDER — SODIUM CHLORIDE 0.9 % IV BOLUS (SEPSIS)
1000.0000 mL | Freq: Once | INTRAVENOUS | Status: AC
  Administered 2015-07-22: 1000 mL via INTRAVENOUS

## 2015-07-22 MED ORDER — SODIUM CHLORIDE 0.9% FLUSH
3.0000 mL | Freq: Two times a day (BID) | INTRAVENOUS | Status: DC
  Administered 2015-07-23 (×2): 3 mL via INTRAVENOUS

## 2015-07-22 MED ORDER — POTASSIUM CHLORIDE 10 MEQ/100ML IV SOLN: 10.0000 meq | INTRAVENOUS | Status: DC

## 2015-07-22 MED ORDER — SODIUM CHLORIDE 0.9 % IV SOLN: INTRAVENOUS | Status: DC

## 2015-07-22 MED ORDER — SODIUM CHLORIDE 0.9 % IV SOLN
INTRAVENOUS | Status: AC
  Administered 2015-07-22: 3 [IU]/h via INTRAVENOUS
  Filled 2015-07-22: qty 2.5

## 2015-07-22 MED ORDER — ONDANSETRON HCL 4 MG/2ML IJ SOLN
4.0000 mg | Freq: Four times a day (QID) | INTRAMUSCULAR | Status: DC | PRN
  Administered 2015-07-22 (×2): 4 mg via INTRAVENOUS
  Filled 2015-07-22 (×2): qty 2

## 2015-07-22 MED ORDER — PROMETHAZINE HCL 25 MG/ML IJ SOLN
25.0000 mg | Freq: Once | INTRAMUSCULAR | Status: AC
  Administered 2015-07-22: 25 mg via INTRAVENOUS
  Filled 2015-07-22: qty 1

## 2015-07-22 MED ORDER — SENNOSIDES-DOCUSATE SODIUM 8.6-50 MG PO TABS: 1.0000 | ORAL_TABLET | Freq: Every evening | ORAL | Status: DC | PRN

## 2015-07-22 MED ORDER — POTASSIUM CHLORIDE 10 MEQ/100ML IV SOLN
10.0000 meq | INTRAVENOUS | Status: AC
  Administered 2015-07-22 (×2): 10 meq via INTRAVENOUS
  Filled 2015-07-22 (×2): qty 100

## 2015-07-22 NOTE — ED Notes (Signed)
CBG 296 

## 2015-07-22 NOTE — H&P (Signed)
Date: 07/22/2015               Patient Name:  [name] [name] MRN: [id number]  DOB: [birth year] Age / Sex: 30 y.o., male   PCP: Dessa Phi, MD         Medical Service: Internal Medicine Teaching Service         Attending Physician: Dr. Doneen Poisson, MD    First Contact: Dr. Terrilee Croak  Pager: [phone]  Second Contact: Dr. Isabella Bowens Pager: [phone]       After Hours (After 5p/  First Contact Pager: [phone]  weekends / holidays): Second Contact Pager: [phone]   Chief Complaint: dyspnea, vomiting, weakness  History of Present Illness:   [name] [name] is a 30 year old man with past medical history of Type 1 DM, hypertriglyceridemia, THC use, and history of renal failure requiring HD in 2013 who presents with dyspnea, vomiting, and weakness.   He reports feeling his normal self until two days where he had decreased PO intake, polydipsia, polyuria, weakness, and 2 episodes of NBNB emesis. He became short of breath with rapid breathing last night. He has mild constipation but denies fruity breath odor,  fever, chills, chest pain, cough, wheezing, sinus tenderness, rhinorrhea, sore throat, abdominal pain, diarrhea, peripheral neuropathy, or change in mental status. He reports unintentional weight loss of 7-8 lb in the past few months.   His last A1c was 15.1 on 06/15/15. He was diagnosed with Type 1 DM when he was 25 after presenting with polyuria while incarcerated. 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 His father had Type 1 DM as well as his uncle. He has had 11 admissions for DKA since October of 2013 with ICU stay in May of 2016.  He required temporary HD in October of 2013 for acute renal failure of unknown etiology. He reports compliance with taking Lantus 25 U daily and Novolog 10 U TID. He checks his blood sugar 3-4 times a day with average in high 200's but for past few days has been in the 300's and this morning was 580. He reports he has cut back on frequent snacking (chips,  sausage, and sugar-free sweets). He denies symptomatic hypoglycemia. He was last seen by his PCP on 06/28/15 and instructed to increase Lantus from 25 U daily to 30 U daily which he did not and Novolog 8 U TID to 10 U TID which he did.     On admission to the ED he was found to be in DKA with blood sugar 571, uncalculated AG, bicarbonate <7, vBG pH 7.001, and urine ketones. He was given 2L NS and started on IV insulin and maintenance fluids.   He currently feels much better and denies any complaints.    Meds: Current Facility-Administered Medications  Medication Dose Route Frequency Provider Last Rate Last Dose  . 0.9 %  sodium chloride infusion   Intravenous Continuous Gwyneth Sprout, MD      . 0.9 %  sodium chloride infusion   Intravenous STAT Draco Malczewski, MD      . acetaminophen (TYLENOL) tablet 650 mg  650 mg Oral Q6H PRN Otis Brace, MD       Or  . acetaminophen (TYLENOL) suppository 650 mg  650 mg Rectal Q6H PRN Nayellie Sanseverino, MD      . dextrose 5 %-0.45 % sodium chloride infusion   Intravenous Continuous Gwyneth Sprout, MD      . enoxaparin (LOVENOX) injection 40 mg  40 mg Subcutaneous Q24H Dyane Broberg,  MD      . insulin regular (NOVOLIN R,HUMULIN R) 250 Units in sodium chloride 0.9 % 250 mL (1 Units/mL) infusion   Intravenous Continuous Gwyneth Sprout, MD 3 mL/hr at 07/22/15 1020 3 Units/hr at 07/22/15 1020  . methocarbamol (ROBAXIN) tablet 1,000 mg  1,000 mg Oral QID PRN Otis Brace, MD      . ondansetron (ZOFRAN) tablet 4 mg  4 mg Oral Q6H PRN Kreed Kauffman, MD       Or  . ondansetron (ZOFRAN) injection 4 mg  4 mg Intravenous Q6H PRN Zariah Cavendish, MD      . potassium chloride 10 mEq in 100 mL IVPB  10 mEq Intravenous Q1 Hr x 4 Deja Kaigler, MD      . senna-docusate (Senokot-S) tablet 1 tablet  1 tablet Oral QHS PRN Khanh Tanori, MD      . sodium chloride flush (NS) 0.9 % injection 3 mL  3 mL Intravenous Q12H Otis Brace, MD       Current Outpatient  Prescriptions  Medication Sig Dispense Refill  . Blood Glucose Monitoring Suppl (TRUE METRIX METER) w/Device KIT 1 each by Does not apply route as needed. 1 kit 0  . glucose blood (TRUE METRIX BLOOD GLUCOSE TEST) test strip 1 each by Other route 3 (three) times daily. 100 each 11  . insulin aspart (NOVOLOG) 100 UNIT/ML FlexPen Inject 10 Units into the skin 3 (three) times daily with meals. 15 mL 5  . Insulin Glargine (LANTUS) 100 UNIT/ML Solostar Pen Inject 25 Units into the skin daily at 10 pm. 30 mL 5  . Insulin Syringe-Needle U-100 (B-D INS SYRINGE 0.5CC/31GX5/16) 31G X 5/16" 0.5 ML MISC 1 each by Does not apply route 2 (two) times daily. 60 each 11  . methocarbamol (ROBAXIN) 500 MG tablet Take 2 tablets (1,000 mg total) by mouth 4 (four) times daily as needed (Pain). 20 tablet 0  . TRUEPLUS LANCETS 28G MISC 1 each by Does not apply route 3 (three) times daily. 100 each 12    Allergies: Allergies as of 07/22/2015  . (No Known Allergies)   Past Medical History  Diagnosis Date  . Diabetes mellitus without complication ([facility]) Dx 2012  . Renal insufficiency 2011    pt was on dialysis for a short time approx 2011, pt is no longer on dialysis   History reviewed. No pertinent past surgical history. Family History  Problem Relation Age of Onset  . Diabetes Father   . Diabetes Maternal Grandmother   . Heart disease Neg Hx   . Hypertension Neg Hx    Social History   Social History  . Marital Status: Single    Spouse Name: N/A  . Number of Children: 1   . Years of Education: 12    Occupational History  . Unemployed     Social History Main Topics  . Smoking status: Former Smoker    Quit date: 09/19/2013  . Smokeless tobacco: Never Used  . Alcohol Use: No  . Drug Use: No  . Sexual Activity: Not on file   Other Topics Concern  . Not on file   Social History Narrative   Lives with mother and older brother.   Has 1 daughter, age 64.           Review of Systems: Review of  Systems  Constitutional: Positive for weight loss and malaise/fatigue. Negative for fever and chills.  HENT: Negative for congestion and sore throat.   Eyes: Negative for blurred  vision.  Respiratory: Positive for shortness of breath (resolved). Negative for cough and wheezing.   Cardiovascular: Negative for chest pain, palpitations and leg swelling.  Gastrointestinal: Negative for nausea, vomiting, abdominal pain, diarrhea and constipation.  Genitourinary: Negative for dysuria, urgency and frequency.       Polyuria  Musculoskeletal: Negative for myalgias.  Neurological: Positive for weakness and headaches (resolved). Negative for dizziness and sensory change.  Endo/Heme/Allergies: Positive for polydipsia.    Physical Exam: Blood pressure 161/112, pulse 104, resp. rate 22, SpO2 100 %.  Physical Exam  Constitutional: He is oriented to person, place, and time. He appears well-developed and well-nourished. No distress.  HENT:  Head: Normocephalic and atraumatic.  Right Ear: External ear normal.  Nose: Nose normal.  Mouth/Throat: Oropharynx is clear and moist. No oropharyngeal exudate.  Eyes: Conjunctivae and EOM are normal. Pupils are equal, round, and reactive to light. Right eye exhibits no discharge. Left eye exhibits no discharge. No scleral icterus.  Neck: Normal range of motion. Neck supple.  Cardiovascular: Normal rate, regular rhythm and normal heart sounds.   No murmur heard. Pulmonary/Chest: Effort normal and breath sounds normal. No respiratory distress. He has no wheezes. He has no rales.  Abdominal: Soft. Bowel sounds are normal. He exhibits no distension. There is no tenderness. There is no rebound and no guarding.  Musculoskeletal: Normal range of motion. He exhibits no edema or tenderness.  Neurological: He is alert and oriented to person, place, and time. No cranial nerve deficit.  Normal 5/5 muscle strength and normal sensation to light touch of extremities.   Skin:  Skin is warm and dry. No rash noted. He is not diaphoretic. No erythema. No pallor.  Psychiatric: He has a normal mood and affect. His behavior is normal. Judgment and thought content normal.     Lab results: Basic Metabolic Panel:  Recent Labs  07/22/15 0754 07/22/15 0758  NA 129* 131*  K 3.8 3.6  CL 98* 104  CO2 <7*  --   GLUCOSE 571* 558*  BUN 20 22*  CREATININE 1.81* 0.90  CALCIUM 9.0  --    Liver Function Tests:  Recent Labs  07/22/15 0754  AST 24  ALT 22  ALKPHOS 100  BILITOT 1.7*  PROT 8.4*  ALBUMIN 4.7  CBC:  Recent Labs  07/22/15 0754 07/22/15 0758  WBC 5.9  --   NEUTROABS 5.1  --   HGB 17.2* 19.4*  HCT 47.9 57.0*  MCV 88.7  --   PLT 253  --   CBG:  Recent Labs  07/22/15 0732 07/22/15 0959  GLUCAP 544* 362*   Urine Drug Screen: Drugs of Abuse     Component Value Date/Time   LABOPIA NONE DETECTED 06/15/2015 2242   COCAINSCRNUR NONE DETECTED 06/15/2015 2242   LABBENZ NONE DETECTED 06/15/2015 2242   AMPHETMU NONE DETECTED 06/15/2015 2242   THCU POSITIVE* 06/15/2015 2242   LABBARB NONE DETECTED 06/15/2015 2242    Urinalysis:  Recent Labs  07/22/15 0850  COLORURINE YELLOW  LABSPEC 1.024  PHURINE 5.0  GLUCOSEU >1000*  HGBUR TRACE*  BILIRUBINUR NEGATIVE  KETONESUR >80*  PROTEINUR 30*  NITRITE NEGATIVE  LEUKOCYTESUR NEGATIVE    Imaging results:  Dg Chest Port 1 View  07/22/2015  CLINICAL DATA:  Shortness of breath. EXAM: PORTABLE CHEST 1 VIEW COMPARISON:  06/15/2015 FINDINGS: The heart size and mediastinal contours are within normal limits. There is no evidence of pulmonary edema, consolidation, pneumothorax, nodule or pleural fluid. The visualized skeletal structures are unremarkable.  IMPRESSION: No active disease. Electronically Signed   By: Aletta Edouard M.D.   On: 07/22/2015 08:36    Other results: EKG: pending  Assessment & Plan by Problem:  DKA in setting of Uncontrolled Type 1 DM - Pt with two-day history of decreased  PO intake, weakness, polydipsia, polyuria, and tachypnea found to have blood sugar 571, uncalculated AG, bicarbonate <7, vBG pH 7.001, and urine ketones consistent with DKA. Etiology of DKA most likely due to noncompliance vs dietary indiscretion vs  inadequate insulin regimen. Pt with uncontrolled Type 1 DM with last A1c of 15.1 on 06/15/15 on Lantus 25 U (supposed to be on 30 U) and Novolog 10 U TID with meals. Pt with no signs of infection or ischemia as triggers for DKA. -Admit to step-down unit and keep NPO  -Continue insulin drip per DKA protocol and NS 150 mL/hr -Replete potassium to keep >4  -Monitor BMP Q 4 hr until AG closes x2  -Follow-up 12-lead EKG  -Consult diabetes coordinator   Non-oliguric AKI - Pt with Cr 1.81 on admission with baseline 0.9-1. Pt with 2 g of proteinuria on 03/22/15.  Etiology most likely pre-renal azotemia due to volume depletion in setting of DKA. Pt with history of acute renal in October of 2013 that required HD due to unclear etiology.  -Continue NS 150 mL/hr -Obtain lipid panel and protein:Cr ratio  -Monitor daily weights and strict I & O's -Monitor BMP  -Avoid nephrotoxins   High Blood Pressure - Currently 166/112. Pt not on anti-hypertensive therapy at home.  -Consider starting ACEi once AKI resolves in setting of proteinuria   Hypertriglyceridemia  - Last lipid panel on 03/22/15 with TG 762 in setting of uncontrolled DM. Pt not on fibrate or statin therapy (was previously on lipitor). No past history of pancreatitis.  -Obtain lipid panel  -If TG >500 start gemfibrozil due to risk of pancreatitis   Diet: NPO DVT Ppx: Lovenox daily  Code: Full   Dispo: Disposition is deferred at this time, awaiting improvement of current medical problems. Anticipated discharge in approximately 1-2 day(s).   The patient does have a current PCP ([name], MD) and does need an [facility] hospital follow-up appointment after discharge.  The patient does have  transportation limitations that hinder transportation to clinic appointments.  Signed: Juluis Mire, MD 07/22/2015, 10:47 AM

## 2015-07-22 NOTE — ED Notes (Signed)
cbg 170 

## 2015-07-22 NOTE — ED Notes (Signed)
Admitting physician at bedside

## 2015-07-22 NOTE — ED Notes (Signed)
Pt reports sob since last night, denies any other symptoms. Pt a/o, resp e/u.

## 2015-07-22 NOTE — ED Notes (Signed)
Pt's CBG result was 165. Informed Chelsea - RN.

## 2015-07-22 NOTE — ED Notes (Signed)
MD at bedside. 

## 2015-07-22 NOTE — Progress Notes (Signed)
Inpatient Diabetes Program Recommendations  AACE/ADA: New Consensus Statement on Inpatient Glycemic Control (2015)  Target Ranges:  Prepandial:   less than 140 mg/dL      Peak postprandial:   less than 180 mg/dL (1-2 hours)      Critically ill patients:  140 - 180 mg/dL  Results for COTE, MAYABB (MRN [id number]) as of 07/22/2015 12:29  Ref. Range 07/22/2015 07:32 07/22/2015 09:59 07/22/2015 11:27  Glucose-Capillary Latest Ref Range: 65-99 mg/dL 409 (H) 811 (H) 914 (H)     Review of Glycemic Control  NOTED Consult for Inpatient Diabetes Coordinator  Diabetes history: DM1 Outpatient Diabetes medications: Latnus 25 units QHS, Novolog 10 units TID with meals Current orders for Inpatient glycemic control: IV insulin per GlucoStabilizer DKA order set  Inpatient Diabetes Program Recommendations: Consult: Noted consult for diabetes coordinator for uncontrolled diabetes in DKA. Patient has been admitted 4 times in the last 6 months and patient has been spoken to at length by Inpatient Diabetes Coordinator. Patient last seen by Inpatient Diabetes Coordinator on 06/17/2015.  Patient is currently on an insulin drip at this time. Will follow as an inpatient. IV insulin/GlucoStabilizer: According to labs, patient remains acidotic. IV insulin infusion with sufficient glucose should be continued until acidosis is corrected as determined by MD (Venous CO2=20, normal anion gap (8-12), negative ketones).  Thanks, Orlando Penner, RN, MSN, CDE Diabetes Coordinator Inpatient Diabetes Program [phone] (Team Pager from 8am to 5pm) [phone] ([place] office) [phone] [facility] [address] office) [phone] [facility] office)

## 2015-07-22 NOTE — ED Notes (Signed)
Admitting physician advised patient is NPO at this time

## 2015-07-22 NOTE — ED Provider Notes (Addendum)
CSN: [id number]     Arrival date & time 07/22/15  6761 History   First MD Initiated Contact with Patient 07/22/15 0730     Chief Complaint  Patient presents with  . Shortness of Breath  . Hyperglycemia     (Consider location/radiation/quality/duration/timing/severity/associated sxs/prior Treatment) HPI Comments: Patient is a 30 year old male with diabetes and prior episodes of DKA presenting today with rapid breathing starting last night. He states 2 days ago he had one episode of emesis and he has had decreased appetite and by mouth intake. He has been taking his insulin at home without any change in regime. Patient states his blood sugars run on the high side but this morning it was 588. He denies any abdominal pain, fever, chest pain, cough, dysuria, diarrhea.  Patient is a 30 y.o. male presenting with hyperglycemia. The history is provided by the patient.  Hyperglycemia Associated symptoms: no abdominal pain, no chest pain, no dysuria, no fever and no nausea     Past Medical History  Diagnosis Date  . Diabetes mellitus without complication ([place]) Dx 9509  . Renal insufficiency 2011    pt was on dialysis for a short time approx 2011, pt is no longer on dialysis   History reviewed. No pertinent past surgical history. Family History  Problem Relation Age of Onset  . Diabetes Father   . Diabetes Maternal Grandmother   . Heart disease Neg Hx   . Hypertension Neg Hx    Social History  Substance Use Topics  . Smoking status: Former Smoker    Quit date: 09/19/2013  . Smokeless tobacco: Never Used  . Alcohol Use: No    Review of Systems  Constitutional: Positive for appetite change. Negative for fever.  HENT: Negative for congestion.   Respiratory: Negative for cough.   Cardiovascular: Negative for chest pain and leg swelling.  Gastrointestinal: Negative for nausea and abdominal pain.       One episode of vomiting 2 days ago  Genitourinary: Negative for dysuria.  All other  systems reviewed and are negative.     Allergies  Review of patient's allergies indicates no known allergies.  Home Medications   Prior to Admission medications   Medication Sig Start Date End Date Taking? Authorizing Provider  Blood Glucose Monitoring Suppl (TRUE METRIX METER) w/Device KIT 1 each by Does not apply route as needed. 06/28/15   Josalyn Funches, MD  glucose blood (TRUE METRIX BLOOD GLUCOSE TEST) test strip 1 each by Other route 3 (three) times daily. 06/28/15   Josalyn Funches, MD  insulin aspart (NOVOLOG) 100 UNIT/ML FlexPen Inject 10 Units into the skin 3 (three) times daily with meals. 06/29/15   [name], MD  Insulin Glargine (LANTUS) 100 UNIT/ML Solostar Pen Inject 25 Units into the skin daily at 10 pm. 06/28/15   [name], MD  Insulin Syringe-Needle U-100 (B-D INS SYRINGE 0.5CC/31GX5/16) 31G X 5/16" 0.5 ML MISC 1 each by Does not apply route 2 (two) times daily. 06/18/15   Nishant Dhungel, MD  methocarbamol (ROBAXIN) 500 MG tablet Take 2 tablets (1,000 mg total) by mouth 4 (four) times daily as needed (Pain). 07/06/15   Nicole Pisciotta, PA-C  TRUEPLUS LANCETS 28G MISC 1 each by Does not apply route 3 (three) times daily. 06/18/15   Nishant Dhungel, MD   BP 151/82 mmHg  Pulse 125  Resp 20  SpO2 100% Physical Exam  Constitutional: He is oriented to person, place, and time. He appears well-developed. He appears cachectic. No distress.  HENT:  Head: Normocephalic and atraumatic.  Mouth/Throat: Mucous membranes are dry.  Eyes: Conjunctivae and EOM are normal. Pupils are equal, round, and reactive to light.  Neck: Normal range of motion. Neck supple.  Cardiovascular: Regular rhythm and intact distal pulses.  Tachycardia present.   No murmur heard. Pulmonary/Chest: Effort normal and breath sounds normal. Tachypnea noted. No respiratory distress. He has no wheezes. He has no rales.  Abdominal: Soft. He exhibits no distension. There is no tenderness. There is no  rebound and no guarding.  Musculoskeletal: Normal range of motion. He exhibits no edema or tenderness.  Neurological: He is alert and oriented to person, place, and time.  Skin: Skin is warm and dry. No rash noted. No erythema.  Psychiatric: He has a normal mood and affect. His behavior is normal.  Nursing note and vitals reviewed.   ED Course  Procedures (including critical care time) Labs Review Labs Reviewed  CBC WITH DIFFERENTIAL/PLATELET - Abnormal; Notable for the following:    Hemoglobin 17.2 (*)    Lymphs Abs 0.6 (*)    All other components within normal limits  COMPREHENSIVE METABOLIC PANEL - Abnormal; Notable for the following:    Sodium 129 (*)    Chloride 98 (*)    CO2 <7 (*)    Glucose, Bld 571 (*)    Creatinine, Ser 1.81 (*)    Total Protein 8.4 (*)    Total Bilirubin 1.7 (*)    GFR calc non Af Amer 48 (*)    GFR calc Af Amer 56 (*)    All other components within normal limits  CBG MONITORING, ED - Abnormal; Notable for the following:    Glucose-Capillary 544 (*)    All other components within normal limits  I-STAT CHEM 8, ED - Abnormal; Notable for the following:    Sodium 131 (*)    BUN 22 (*)    Glucose, Bld 558 (*)    Hemoglobin 19.4 (*)    HCT 57.0 (*)    All other components within normal limits  I-STAT VENOUS BLOOD GAS, ED - Abnormal; Notable for the following:    pH, Ven 7.001 (*)    pCO2, Ven 17.9 (*)    Bicarbonate 4.4 (*)    Acid-base deficit 25.0 (*)    All other components within normal limits  URINALYSIS, ROUTINE W REFLEX MICROSCOPIC (NOT AT [facility])  I-STAT CG4 LACTIC ACID, ED    Imaging Review Dg Chest Port 1 View  07/22/2015  CLINICAL DATA:  Shortness of breath. EXAM: PORTABLE CHEST 1 VIEW COMPARISON:  06/15/2015 FINDINGS: The heart size and mediastinal contours are within normal limits. There is no evidence of pulmonary edema, consolidation, pneumothorax, nodule or pleural fluid. The visualized skeletal structures are unremarkable.  IMPRESSION: No active disease. Electronically Signed   By: Aletta Edouard M.D.   On: 07/22/2015 08:36   I have personally reviewed and evaluated these images and lab results as part of my medical decision-making.   EKG Interpretation None      MDM   Final diagnoses:  Diabetic ketoacidosis without coma associated with type 1 diabetes mellitus [facility])    Patient is a 30 year old male coming in for tachypnea. When walking into the room the patient smells of ketones appears dehydrated and is tachycardic. He has a history of diabetes and has been taking his insulin but his blood sugars been running high and he states he has had no appetite in the last 2 days. Patient appears to be in  DKA at this time. He was started on IV fluids. He has a history of renal disease but states he only was on dialysis for 1 day 6 years ago. He has no evidence of fluid overload or swelling in the lower extremities. Breath sounds are clear and feel the tachypnea is most likely compensation for his metabolic acidosis  1:06 AM Labs are consistent with DKA with a pH of 7.0 and a bicarbonate of 4. Blood sugar is 558 with a normal creatinine of 0.9 and a potassium of 3.6. Patient will also need potassium replacement. He will receive 2 L of fluid and will require admission to a stepdown bed.  Patient given 20 mEq of potassium IV. CRITICAL CARE Performed by: Blanchie Dessert Total critical care time: 30 minutes Critical care time was exclusive of separately billable procedures and treating other patients. Critical care was necessary to treat or prevent imminent or life-threatening deterioration. Critical care was time spent personally by me on the following activities: development of treatment plan with patient and/or surrogate as well as nursing, discussions with consultants, evaluation of patient's response to treatment, examination of patient, obtaining history from patient or surrogate, ordering and performing treatments  and interventions, ordering and review of laboratory studies, ordering and review of radiographic studies, pulse oximetry and re-evaluation of patient's condition.   Blanchie Dessert, MD 07/22/15 2694  Blanchie Dessert, MD 07/22/15 [phone]

## 2015-07-23 ENCOUNTER — Encounter: Payer: Self-pay | Admitting: Pharmacist

## 2015-07-23 DIAGNOSIS — E781 Pure hyperglyceridemia: Secondary | ICD-10-CM

## 2015-07-23 DIAGNOSIS — E1010 Type 1 diabetes mellitus with ketoacidosis without coma: Principal | ICD-10-CM

## 2015-07-23 DIAGNOSIS — Z794 Long term (current) use of insulin: Secondary | ICD-10-CM

## 2015-07-23 LAB — BASIC METABOLIC PANEL
ANION GAP: 10 (ref 5–15)
ANION GAP: 15 (ref 5–15)
Anion gap: 10 (ref 5–15)
Anion gap: 7 (ref 5–15)
Anion gap: 9 (ref 5–15)
BUN: 12 mg/dL (ref 6–20)
BUN: 13 mg/dL (ref 6–20)
BUN: 16 mg/dL (ref 6–20)
BUN: 17 mg/dL (ref 6–20)
BUN: 17 mg/dL (ref 6–20)
CALCIUM: 7.7 mg/dL — AB (ref 8.9–10.3)
CALCIUM: 8.1 mg/dL — AB (ref 8.9–10.3)
CHLORIDE: 110 mmol/L (ref 101–111)
CHLORIDE: 113 mmol/L — AB (ref 101–111)
CHLORIDE: 109 mmol/L (ref 101–111)
CO2: 11 mmol/L — ABNORMAL LOW (ref 22–32)
CO2: 15 mmol/L — AB (ref 22–32)
CO2: 17 mmol/L — AB (ref 22–32)
CO2: 15 mmol/L — ABNORMAL LOW (ref 22–32)
CO2: 15 mmol/L — AB (ref 22–32)
CREATININE: 0.72 mg/dL (ref 0.61–1.24)
CREATININE: 0.79 mg/dL (ref 0.61–1.24)
CREATININE: 0.79 mg/dL (ref 0.61–1.24)
Calcium: 7.9 mg/dL — ABNORMAL LOW (ref 8.9–10.3)
Calcium: 8.2 mg/dL — ABNORMAL LOW (ref 8.9–10.3)
Calcium: 8.3 mg/dL — ABNORMAL LOW (ref 8.9–10.3)
Chloride: 112 mmol/L — ABNORMAL HIGH (ref 101–111)
Chloride: 109 mmol/L (ref 101–111)
Creatinine, Ser: 0.96 mg/dL (ref 0.61–1.24)
Creatinine, Ser: 0.98 mg/dL (ref 0.61–1.24)
GFR calc Af Amer: >60 mL/min (ref >60)
GFR calc Af Amer: >60 mL/min (ref >60)
GFR calc Af Amer: >60 mL/min (ref >60)
GFR calc Af Amer: >60 mL/min (ref >60)
GFR calc non Af Amer: >60 mL/min (ref >60)
GFR calc non Af Amer: >60 mL/min (ref >60)
GFR calc non Af Amer: >60 mL/min (ref >60)
GLUCOSE: 226 mg/dL — AB (ref 65–99)
GLUCOSE: 109 mg/dL — AB (ref 65–99)
Glucose, Bld: 122 mg/dL — ABNORMAL HIGH (ref 65–99)
Glucose, Bld: 148 mg/dL — ABNORMAL HIGH (ref 65–99)
Glucose, Bld: 160 mg/dL — ABNORMAL HIGH (ref 65–99)
POTASSIUM: 3 mmol/L — AB (ref 3.5–5.1)
POTASSIUM: 3.5 mmol/L (ref 3.5–5.1)
POTASSIUM: 3 mmol/L — AB (ref 3.5–5.1)
Potassium: 3.1 mmol/L — ABNORMAL LOW (ref 3.5–5.1)
Potassium: 3.2 mmol/L — ABNORMAL LOW (ref 3.5–5.1)
SODIUM: 135 mmol/L (ref 135–145)
SODIUM: 135 mmol/L (ref 135–145)
Sodium: 134 mmol/L — ABNORMAL LOW (ref 135–145)
Sodium: 135 mmol/L (ref 135–145)
Sodium: 138 mmol/L (ref 135–145)

## 2015-07-23 LAB — GLUCOSE, CAPILLARY
GLUCOSE-CAPILLARY: 135 mg/dL — AB (ref 65–99)
GLUCOSE-CAPILLARY: 149 mg/dL — AB (ref 65–99)
GLUCOSE-CAPILLARY: 149 mg/dL — AB (ref 65–99)
GLUCOSE-CAPILLARY: 140 mg/dL — AB (ref 65–99)
GLUCOSE-CAPILLARY: 123 mg/dL — AB (ref 65–99)
GLUCOSE-CAPILLARY: 374 mg/dL — AB (ref 65–99)
GLUCOSE-CAPILLARY: 283 mg/dL — AB (ref 65–99)
Glucose-Capillary: 103 mg/dL — ABNORMAL HIGH (ref 65–99)
Glucose-Capillary: 111 mg/dL — ABNORMAL HIGH (ref 65–99)
Glucose-Capillary: 148 mg/dL — ABNORMAL HIGH (ref 65–99)
Glucose-Capillary: 190 mg/dL — ABNORMAL HIGH (ref 65–99)
Glucose-Capillary: 193 mg/dL — ABNORMAL HIGH (ref 65–99)
Glucose-Capillary: 223 mg/dL — ABNORMAL HIGH (ref 65–99)
Glucose-Capillary: 96 mg/dL (ref 65–99)

## 2015-07-23 LAB — MRSA PCR SCREENING: MRSA BY PCR: NEGATIVE

## 2015-07-23 LAB — MAGNESIUM: MAGNESIUM: 1.8 mg/dL (ref 1.7–2.4)

## 2015-07-23 MED ORDER — INSULIN GLARGINE 100 UNIT/ML ~~LOC~~ SOLN
20.0000 [IU] | SUBCUTANEOUS | Status: DC
  Administered 2015-07-23: 20 [IU] via SUBCUTANEOUS
  Filled 2015-07-23 (×2): qty 0.2

## 2015-07-23 MED ORDER — INSULIN ASPART 100 UNIT/ML ~~LOC~~ SOLN
0.0000 [IU] | Freq: Every day | SUBCUTANEOUS | Status: DC
  Administered 2015-07-23: 3 [IU] via SUBCUTANEOUS

## 2015-07-23 MED ORDER — SODIUM CHLORIDE 0.9 % IV BOLUS (SEPSIS)
1000.0000 mL | Freq: Once | INTRAVENOUS | Status: AC
  Administered 2015-07-23: 1000 mL via INTRAVENOUS

## 2015-07-23 MED ORDER — POTASSIUM CHLORIDE CRYS ER 20 MEQ PO TBCR
40.0000 meq | EXTENDED_RELEASE_TABLET | Freq: Two times a day (BID) | ORAL | Status: AC
  Administered 2015-07-23 (×2): 40 meq via ORAL
  Filled 2015-07-23 (×2): qty 2

## 2015-07-23 MED ORDER — INSULIN ASPART 100 UNIT/ML ~~LOC~~ SOLN
0.0000 [IU] | Freq: Three times a day (TID) | SUBCUTANEOUS | Status: DC
  Administered 2015-07-23: 9 [IU] via SUBCUTANEOUS

## 2015-07-23 MED ORDER — POTASSIUM CHLORIDE 10 MEQ/100ML IV SOLN
10.0000 meq | INTRAVENOUS | Status: AC
  Administered 2015-07-23 (×4): 10 meq via INTRAVENOUS
  Filled 2015-07-23 (×3): qty 100

## 2015-07-23 MED ORDER — POTASSIUM CHLORIDE 10 MEQ/100ML IV SOLN: 10.0000 meq | INTRAVENOUS | Status: DC

## 2015-07-23 MED ORDER — POTASSIUM CHLORIDE 10 MEQ/100ML IV SOLN
10.0000 meq | INTRAVENOUS | Status: AC
  Administered 2015-07-23 (×2): 10 meq via INTRAVENOUS
  Filled 2015-07-23 (×2): qty 100

## 2015-07-23 NOTE — Hospital Discharge Follow-Up (Signed)
Transitional Care Clinic Care Coordination Note:  Admit date:  07/22/15 Discharge date: TBD Discharge Disposition: TBD Patient contact: [phone] (mobile); [phone] (home) Emergency contact(s): Bunnie Domino ([phone]  This Case Manager reviewed patient's EMR and determined patient would benefit from post-discharge medical management and chronic care management services through the [facility]. Patient has a history of type 1 diabetes mellitus, hypertriglyceridemia, history of renal failure requiring HD in 2013. Receiving treatment for DKA, non-oliguric acute kidney injury, high blood pressure. Patient has had 4 inpatient admissions and 2 ED visits in the last six months. This Case Manager met with patient to discuss the services and medical management that can be provided at the [facility]. Patient verbalized understanding and agreed to receive post-discharge care at the [facility].   Patient scheduled for Transitional Care appointment on 07/27/15 at 1145 with Dr. Jarold Song.  Clinic information and appointment time provided to patient. Appointment information also placed on AVS.  Assessment:       Home Environment: Patient lives with his mother in a private residence.       Support System: Mother-Gay Glennon Mac (Ph# [phone]).       Level of functioning: Independent       Home DME: none.        Home care services: none       Transportation: Patient indicates he typically takes the bus to [place]. He denies difficulty getting to the clinic.        Food/Nutrition: Patient indicates he has access to needed food.        Medications: Patient indicates he gets his medications from [facility] and [place]. He denies problems affording or obtaining his medications, including Novolog and Lantus. Reminded patient of pharmacy resources available at [facility] and [place]. Patient  verbalized understanding. Patient also indicates he has a glucometer, test strips, and lancets at home.        Identified Barriers: Uninsured-Patient informed that [employer] and [employer] has onsite Financial Counselors who can determine if he is eligible for the [employer] or [employer]. Patient informed appointment can be scheduled with Development worker, community and also informed patient of Financial Counseling Walk-in hours. Patient appreciative of information. Additional barrier: possible medical noncompliance.        PCP: Dr. Adrian Blackwater ([place])   Arranged services:    Services communicated to [facility], RN CM

## 2015-07-23 NOTE — Progress Notes (Signed)
   Subjective: Patient feels much better today. He says his shortness of breath, nausea, and vomiting is improved. He denies any abdominal pain. He feels he is beginning to gain an appetite. He is confused why he keeps going into DKA as he reports adherence to his home insulin. Objective: Vital signs in last 24 hours: Filed Vitals:   07/23/15 0200 07/23/15 0411 07/23/15 0500 07/23/15 0759  BP: 136/94 144/106  130/98  Pulse: 94 100  93  Temp:  98.9 F (37.2 C)  98.9 F (37.2 C)  TempSrc:  Oral  Oral  Resp: 14 16  13   Height:      Weight:   106 lb 0.7 oz (48.1 kg)   SpO2: 100% 100%  100%   Weight change:   Intake/Output Summary (Last 24 hours) at 07/23/15 1248 Last data filed at 07/23/15 0900  Gross per 24 hour  Intake 1271.19 ml  Output    650 ml  Net 621.19 ml   General: resting in bed, no acute distress Cardiac: RRR, no rubs, murmurs or gallops Pulm: clear to auscultation bilaterally Abd: soft, nontender, nondistended, BS present Ext: warm and well perfused, no pedal edema Neuro: alert and oriented X3  Assessment/Plan: Principal Problem:   Diabetic ketoacidosis without coma associated with type 1 diabetes mellitus (HCC) Active Problems:   HLD (hyperlipidemia)   Acute renal failure (HCC)   Proteinuria with type 1 diabetes mellitus (HCC)   High blood pressure  DKA in setting of Uncontrolled Type 1 DM - Pt with two-day history of decreased PO intake, weakness, polydipsia, polyuria, and tachypnea found to have blood sugar 571, uncalculated AG, bicarbonate <7, vBG pH 7.001, and urine ketones consistent with DKA. Likely due to noncompliance vs dietary indiscretion. Last A1c of 15.1 on 06/15/15 on Lantus 25 U (supposed to be on 30 U) and Novolog 10 U TID with meals. Pt with no signs of infection or ischemia as triggers for DKA. On last BMP - Anion gap closed x 1, HCO3 15. -Continue insulin drip per DKA protocol and NS 150 mL/hr -Replete potassium to keep >4  -1L NS IV bolus  over 4 hours -Monitor BMP Q 4 hr until AG closes x2  -When gap closes x2, will initiate sub-q Lantus 20 units with SSI, start carb diet, then d/c insulin drip and fluids 2 hours afterwards   Hypertriglyceridemia - Last lipid panel on 03/22/15 with TG 762 in setting of uncontrolled DM. Pt not on fibrate or statin therapy (was previously on lipitor). No past history of pancreatitis. Current TG is 402.  -f/u LDL  Dispo: Disposition is deferred at this time, awaiting improvement of current medical problems.  Anticipated discharge in approximately 1 day(s).     LOS: 1 day   Darreld McleanVishal Mykhia Danish, MD 07/23/2015, 12:48 PM

## 2015-07-23 NOTE — Progress Notes (Signed)
Initial Nutrition Assessment  DOCUMENTATION CODES:   Underweight  INTERVENTION:   Advance diet as medically appropriate, add supplements when able  NUTRITION DIAGNOSIS:   Inadequate oral intake related to inability to eat as evidenced by NPO status  GOAL:   Patient will meet greater than or equal to 90% of their needs  MONITOR:   Diet advancement, PO intake, Labs, Weight trends, I & O's  REASON FOR ASSESSMENT:   Malnutrition Screening Tool  ASSESSMENT:   30 yo Male with PMH of Type 1 DM, hypertriglyceridemia, THC use, and history of renal failure requiring HD in 2013 who presents with dyspnea, vomiting, and weakness.   Patient minimally conversant with RD. Laying in bed with covers over entire body. Per Malnutrition Screening Tool Report, pt has been eating poorly because of a decreased appetite. Per wt readings below, patient has had a 10% wt loss in approximately 2 weeks. Meets criteria for underweight status. He is currently NPO, however, would benefits from supplements when able.  RD unable to complete Nutrition Focused Physical Exam at this time.  Diet Order:  Diet NPO time specified  Skin:  Reviewed, no issues  Last BM:  N/A  Height:   Ht Readings from Last 1 Encounters:  07/22/15 5\' 7"  (1.702 m)    Weight:   Wt Readings from Last 1 Encounters:  07/23/15 106 lb 0.7 oz (48.1 kg)    Wt Readings from Last 10 Encounters:  07/23/15 106 lb 0.7 oz (48.1 kg)  07/06/15 118 lb (53.524 kg)  06/28/15 118 lb (53.524 kg)  06/15/15 115 lb 4.8 oz (52.3 kg)  06/07/15 122 lb (55.339 kg)  06/01/15 121 lb (54.885 kg)  03/23/15 119 lb 4.3 oz (54.1 kg)  12/17/14 119 lb (53.978 kg)  10/09/14 123 lb (55.792 kg)  10/01/14 115 lb 1.3 oz (52.2 kg)    Ideal Body Weight:  67.2 kg  BMI:  Body mass index is 16.6 kg/(m^2).  Estimated Nutritional Needs:   Kcal:  1600-1800  Protein:  80-90 gm  Fluid:  1.6-1.8 L  EDUCATION NEEDS:   No education needs identified  at this time  Maureen ChattersKatie Sonna Lipsky, RD, LDN Pager #: [phone] After-Hours Pager #: [phone]

## 2015-07-23 NOTE — Discharge Summary (Signed)
Name: [name] [name] MRN: [id number] DOB: 1986/02/13 30 y.o. PCP: [name], MD  Date of Admission: 07/22/2015  7:28 AM Date of Discharge: 07/24/2015 Attending Physician: Oval Linsey, MD  Discharge Diagnosis: 1. DKA Principal Problem:   Diabetic ketoacidosis without coma associated with type 1 diabetes mellitus ([place]) Active Problems:   HLD (hyperlipidemia)   Acute renal failure (HCC)   Proteinuria with type 1 diabetes mellitus ([place])   High blood pressure  Discharge Medications:   Medication List    TAKE these medications        glucose blood test strip  Commonly known as:  TRUE METRIX BLOOD GLUCOSE TEST  1 each by Other route 3 (three) times daily.     insulin aspart 100 UNIT/ML FlexPen  Commonly known as:  NOVOLOG  Inject 10 Units into the skin 3 (three) times daily with meals.     Insulin Glargine 100 UNIT/ML Solostar Pen  Commonly known as:  LANTUS  Inject 30 Units into the skin daily at 10 pm.     Insulin Syringe-Needle U-100 31G X 5/16" 0.5 ML Misc  Commonly known as:  B-D INS SYRINGE 0.5CC/31GX5/16  1 each by Does not apply route 2 (two) times daily.     methocarbamol 500 MG tablet  Commonly known as:  ROBAXIN  Take 2 tablets (1,000 mg total) by mouth 4 (four) times daily as needed (Pain).     TRUE METRIX METER w/Device Kit  1 each by Does not apply route as needed.     TRUEPLUS LANCETS 28G Misc  1 each by Does not apply route 3 (three) times daily.        Disposition and follow-up:   [name] [name] was discharged from [facility] in Good condition.  At the hospital follow up visit please address:  1.  Diabetic control, medication adherence, hyperlipidemia  2.  Labs / imaging needed at time of follow-up: none  3.  Pending labs/ test needing follow-up: none  Follow-up Appointments: Follow-up Information    Follow up with [place] On 07/27/2015.   Why:  Transitional Care Clinic appointment on  07/27/15 at 11:45 am with Dr. Jarold Song.   Contact information:   [address] [place] [address] [phone]      Discharge Instructions: Discharge Instructions    Call MD for:  difficulty breathing, headache or visual disturbances    Complete by:  As directed      Call MD for:  persistant dizziness or light-headedness    Complete by:  As directed      Call MD for:  persistant nausea and vomiting    Complete by:  As directed      Call MD for:  severe uncontrolled pain    Complete by:  As directed      Call MD for:  temperature >100.4    Complete by:  As directed      Diet Carb Modified    Complete by:  As directed      Increase activity slowly    Complete by:  As directed            Consultations:    Procedures Performed:  Dg Chest Port 1 View  07/22/2015  CLINICAL DATA:  Shortness of breath. EXAM: PORTABLE CHEST 1 VIEW COMPARISON:  06/15/2015 FINDINGS: The heart size and mediastinal contours are within normal limits. There is no evidence of pulmonary edema, consolidation, pneumothorax, nodule or pleural fluid. The  visualized skeletal structures are unremarkable. IMPRESSION: No active disease. Electronically Signed   By: Aletta Edouard M.D.   On: 07/22/2015 08:36    2D Echo:   Cardiac Cath:   Admission HPI:  [name] [name] is a 30 year old man with past medical history of Type 1 DM, hypertriglyceridemia, THC use, and history of renal failure requiring HD in 2013 who presents with dyspnea, vomiting, and weakness.   He reports feeling his normal self until two days where he had decreased PO intake, polydipsia, polyuria, weakness, and 2 episodes of NBNB emesis. He became short of breath with rapid breathing last night. He has mild constipation but denies fruity breath odor, fever, chills, chest pain, cough, wheezing, sinus tenderness, rhinorrhea, sore throat, abdominal pain, diarrhea, peripheral neuropathy, or change in mental status. He reports unintentional  weight loss of 7-8 lb in the past few months.   His last A1c was 15.1 on 06/15/15. He was diagnosed with Type 1 DM when he was 25 after presenting with polyuria while incarcerated. 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 His father had Type 1 DM as well as his uncle. He has had 11 admissions for DKA since October of 2013 with ICU stay in May of 2016. He required temporary HD in October of 2013 for acute renal failure of unknown etiology. He reports compliance with taking Lantus 25 U daily and Novolog 10 U TID. He checks his blood sugar 3-4 times a day with average in high 200's but for past few days has been in the 300's and this morning was 580. He reports he has cut back on frequent snacking (chips, sausage, and sugar-free sweets). He denies symptomatic hypoglycemia. He was last seen by his PCP on 06/28/15 and instructed to increase Lantus from 25 U daily to 30 U daily which he did not and Novolog 8 U TID to 10 U TID which he did.   On admission to the ED he was found to be in DKA with blood sugar 571, uncalculated AG, bicarbonate <7, vBG pH 7.001, and urine ketones. He was given 2L NS and started on IV insulin and maintenance fluids.   He currently feels much better and denies any complaints.  Hospital Course by problem list: Principal Problem:   Diabetic ketoacidosis without coma associated with type 1 diabetes mellitus ([place]) Active Problems:   HLD (hyperlipidemia)   Acute renal failure (HCC)   Proteinuria with type 1 diabetes mellitus (HCC)   High blood pressure   DKA in setting of Uncontrolled Type 1 DM - Pt with two-day history of decreased PO intake, weakness, polydipsia, polyuria, and tachypnea found to have blood sugar 571, uncalculated AG, bicarbonate <7, vBG pH 7.001, and urine ketones consistent with DKA. Etiology of DKA most likely due to noncompliance vs dietary indiscretion vs inadequate insulin regimen. Patient treated with IVF and Insulin drip until gap closed x 2  then continued for 2 hours while transitioning to sub-q insulin and replacing K as indicated. Sub-q Lantus was started at 20 units along with SSI. Lantus was increased to 30 units daily and Novolog 10 units TID with meals added.   Hyperlipidemia: Last lipid panel on 03/22/15 with TG 762 in setting of uncontrolled DM. Pt not on fibrate or statin therapy (was previously on lipitor). No past history of pancreatitis. Current TG is 402.   Discharge Vitals:   BP 140/98 mmHg  Pulse 107  Temp(Src) 98.4 F (36.9 C) (Oral)  Resp 9  Ht _0  (1.702 m)  Wt 115 lb 8.3 oz (52.4 kg)  BMI 18.09 kg/m2  SpO2 100%  Discharge Labs:  No results found for this or any previous visit (from the past 24 hour(s)).  Signed: Zada Finders, MD 07/26/2015, 7:44 AM    Services Ordered on Discharge: none Equipment Ordered on Discharge: none

## 2015-07-23 NOTE — Progress Notes (Signed)
Utilization Review Completed.  

## 2015-07-23 NOTE — Progress Notes (Signed)
Inpatient Diabetes Program Recommendations  AACE/ADA: New Consensus Statement on Inpatient Glycemic Control (2015)  Target Ranges:  Prepandial:   less than 140 mg/dL      Peak postprandial:   less than 180 mg/dL (1-2 hours)      Critically ill patients:  140 - 180 mg/dL  Results for [name] [name], [name] [name] (MRN [id number]) as of 07/23/2015 10:08  Ref. Range 07/23/2015 00:30 07/23/2015 01:34 07/23/2015 02:39 07/23/2015 03:42 07/23/2015 04:57 07/23/2015 05:45 07/23/2015 06:38 07/23/2015 07:46 07/23/2015 09:03  Glucose-Capillary Latest Ref Range: 65-99 mg/dL 409148 (H) 811135 (H) 914149 (H) 149 (H) 223 (H) 193 (H) 190 (H) 140 (H) 96  Results for [name] [name], [name] [name] (MRN [id number]) as of 07/23/2015 10:08  Ref. Range 07/23/2015 08:59  CO2 Latest Ref Range: 22-32 mmol/L 15 (L)  Results for [name] [name], [name] [name] (MRN [id number]) as of 07/23/2015 10:08  Ref. Range 07/23/2015 08:59  Anion gap Latest Ref Range: 5-15  10   Review of Glycemic Control  Diabetes history: DM1 Outpatient Diabetes medications: Latnus 25 units QHS, Novolog 10 units TID with meals Current orders for Inpatient glycemic control: IV insulin per GlucoStabilizer DKA order set  Inpatient Diabetes Program Recommendations: Insulin - Basal: Once acidosis is cleared and MD is ready to transition from IV to SQ insulin, please consider ordering Lantus 15 units Q24H (based on 48 kg x 0.3 units). Correction (SSI): Once acidosis is cleared and MD is ready to transition from IV to SQ insulin, please consider ordering Novolog sensitive correction scale. Insulin - Meal Coverage: Once acidosis is cleared and MD is ready to transition from IV to SQ insulin, please consider ordering Novolog 4 TID with meals for meal coverage if patient eats at least 50% of meals. Inpatient Diabetes Coordinator Referral: Noted consult for diabetes coordinator for uncontrolled diabetes in DKA. Patient has been admitted 4 times in the last 6 months and patient has been spoken to at length by Inpatient Diabetes Coordinator.  Patient is currently on an insulin drip at this time. Will follow as an inpatient. Diet: Once diet is resumed, please order Carb Modified diet.  Thanks, Orlando PennerMarie Markell Sciascia, RN, MSN, CDE Diabetes Coordinator Inpatient Diabetes Program [phone] (Team Pager from 8am to 5pm) [phone] ([facility] office) [phone] [facility] office) [phone] [facility] office)

## 2015-07-23 NOTE — Progress Notes (Signed)
Patient ID: [name] [name], male   DOB: 04/13/1986, 30 y.o.   MRN: [id number]  Medication Samples have been provided to the patient.  Drug: Insulin glargine (Lantus) Strength: 100 units/mL Qty: 2 LOT: [id number] Exp.Date: 07/2017 Drug: Insulin aspart (Novolog) Strength: 100 units/mL Qty: 2 LOT: [id number] Exp.Date: 11/2015  The patient has been instructed regarding the correct time, dose, and frequency of taking this medication, including desired effects and most common side effects.   Kim,Jennifer J 2:18 PM 07/23/2015

## 2015-07-24 LAB — GLUCOSE, CAPILLARY
Glucose-Capillary: 341 mg/dL — ABNORMAL HIGH (ref 65–99)
Glucose-Capillary: 212 mg/dL — ABNORMAL HIGH (ref 65–99)

## 2015-07-24 LAB — BASIC METABOLIC PANEL
Anion gap: 6 (ref 5–15)
BUN: 8 mg/dL (ref 6–20)
CALCIUM: 8.4 mg/dL — AB (ref 8.9–10.3)
CO2: 20 mmol/L — AB (ref 22–32)
Chloride: 109 mmol/L (ref 101–111)
Creatinine, Ser: 0.71 mg/dL (ref 0.61–1.24)
GFR calc Af Amer: >60 mL/min (ref >60)
GLUCOSE: 379 mg/dL — AB (ref 65–99)
Potassium: 3.8 mmol/L (ref 3.5–5.1)
Sodium: 135 mmol/L (ref 135–145)

## 2015-07-24 LAB — HIV ANTIBODY (ROUTINE TESTING W REFLEX): HIV Screen 4th Generation wRfx: NONREACTIVE

## 2015-07-24 LAB — LDL CHOLESTEROL, DIRECT: Direct LDL: 127 mg/dL — ABNORMAL HIGH (ref 0–99)

## 2015-07-24 MED ORDER — INSULIN GLARGINE 100 UNIT/ML ~~LOC~~ SOLN
25.0000 [IU] | SUBCUTANEOUS | Status: DC
  Filled 2015-07-24: qty 0.25

## 2015-07-24 MED ORDER — INSULIN GLARGINE 100 UNIT/ML ~~LOC~~ SOLN
30.0000 [IU] | SUBCUTANEOUS | Status: DC
  Administered 2015-07-24: 30 [IU] via SUBCUTANEOUS
  Filled 2015-07-24: qty 0.3

## 2015-07-24 MED ORDER — INSULIN GLARGINE 100 UNIT/ML SOLOSTAR PEN: 30.0000 [IU] | PEN_INJECTOR | Freq: Every day | SUBCUTANEOUS | Status: DC

## 2015-07-24 MED ORDER — INSULIN ASPART 100 UNIT/ML ~~LOC~~ SOLN
0.0000 [IU] | Freq: Three times a day (TID) | SUBCUTANEOUS | Status: DC
  Administered 2015-07-24: 5 [IU] via SUBCUTANEOUS

## 2015-07-24 MED ORDER — INSULIN ASPART 100 UNIT/ML ~~LOC~~ SOLN
10.0000 [IU] | Freq: Three times a day (TID) | SUBCUTANEOUS | Status: DC
  Administered 2015-07-24 (×2): 10 [IU] via SUBCUTANEOUS

## 2015-07-24 NOTE — Discharge Instructions (Signed)
Diabetic Ketoacidosis Diabetic ketoacidosis is a life-threatening complication of diabetes. If it is not treated, it can cause severe dehydration and organ damage and can lead to a coma or death.  CAUSES This condition develops when there is not enough of the hormone insulin in the body. Insulin helps the body to break down sugar for energy. Without insulin, the body cannot break down sugar, so it breaks down fats instead. This leads to the production of acids that are called ketones. Ketones are poisonous at high levels. This condition can be triggered by:  Stress on the body that is brought on by an illness.  Medicines that raise blood glucose levels.  Not taking diabetes medicine.  SYMPTOMS Symptoms of this condition include:  Fatigue.  Weight loss.  Excessive thirst.  Light-headedness.  Fruity or sweet-smelling breath.  Excessive urination.  Vision changes.  Confusion or irritability.  Nausea.  Vomiting.  Rapid breathing.  Abdominal pain.  Feeling flushed.  TREATMENT This condition may be treated with:  Fluid replacement. This may be done to correct dehydration.  Insulin injections. These may be given through the skin or through an IV tube.  Electrolyte replacement. Electrolytes, such as potassium and sodium, may be given in pill form or through an IV tube.  Antibiotic medicines. These may be prescribed if your condition was caused by an infection.  HOME CARE INSTRUCTIONS Eating and Drinking  Drink enough fluids to keep your urine clear or pale yellow.  If you cannot eat, alternate between drinking fluids with sugar (such as juice) and salty fluids (such as broth or bouillon).  If you can eat, follow your usual diet and drink sugar-free liquids, such as water.  Other Instructions  Take insulin as directed by your health care provider. Do not skip insulin injections. Do not use expired insulin.  If your blood sugar is over 240 mg/dL, monitor your  urine ketones every 4-6 hours.  If you were prescribed an antibiotic medicine, finish all of it even if you start to feel better.  Rest and exercise only as directed by your health care provider.  If you get sick, call your health care provider and begin treatment quickly. Your body often needs extra insulin to fight an illness.  Check your blood glucose levels regularly. If your blood glucose is high, drink plenty of fluids. This helps to flush out ketones.  SEEK MEDICAL CARE IF:  Your blood glucose level is too high or too low.  You have ketones in your urine.  You have a fever.  You cannot eat.  You cannot tolerate fluids.  You have been vomiting for more than 2 hours.  You continue to have symptoms of this condition.  You develop new symptoms.  SEEK IMMEDIATE MEDICAL CARE IF:  Your blood glucose levels continue to be high (elevated).  Your monitor reads "high" even when you are taking insulin.  You faint.  You have chest pain.  You have trouble breathing.  You have a sudden, severe headache.  You have sudden weakness in one arm or one leg.  You have sudden trouble speaking or swallowing.  You have vomiting or diarrhea that gets worse after 3 hours.  You feel severely fatigued.  You have trouble thinking.  You have abdominal pain.  You are severely dehydrated. Symptoms of severe dehydration include:  Extreme thirst.  Dry mouth.  Blue lips.  Cold hands and feet.  Rapid breathing.   This information is not intended to replace advice given to  you by your health care provider. Make sure you discuss any questions you have with your health care provider.   Document Released: 05/05/2000 Document Revised: 09/22/2014 Document Reviewed: 04/15/2014 Elsevier Interactive Patient Education [employer]

## 2015-07-24 NOTE — Progress Notes (Signed)
   Subjective: Doing well this morning. He feels that he has returned to his baseline. Denies CP, SOB, N/V, abdominal pain.  Objective: Vital signs in last 24 hours: Filed Vitals:   07/23/15 1900 07/24/15 0017 07/24/15 0400 07/24/15 0500  BP: 132/93 130/99 137/96   Pulse: 103 98 91   Temp: 99.2 F (37.3 C) 97.4 F (36.3 C) 98.4 F (36.9 C)   TempSrc: Oral Oral Oral   Resp: 16 9    Height:      Weight:    115 lb 8.3 oz (52.4 kg)  SpO2:   100%    Weight change: 9 lb 7.7 oz (4.3 kg)  Intake/Output Summary (Last 24 hours) at 07/24/15 0754 Last data filed at 07/24/15 0600  Gross per 24 hour  Intake 1087.19 ml  Output   2950 ml  Net -1862.81 ml   General Apperance: NAD HEENT: Normocephalic, atraumatic, anicteric sclera Neck: Supple, trachea midline Lungs: Clear to auscultation bilaterally. No wheezes, rhonchi or rales. Breathing comfortably Heart: Regular rate and rhythm, no murmur/rub/gallop Abdomen: Soft, nontender, nondistended, no rebound/guarding Extremities: Warm and well perfused, no edema Skin: No rashes or lesions Neurologic: Alert and interactive. No gross deficits.  Assessment/Plan: 30 year old man with type 1 diabetes and frequent admissions for DKA felt secondary to noncompliance with his insulin regimen or dietary indiscretion who presents with a 2 day history of polyuria, polydipsia, decreased appetite, and tachypnea.  DKA in setting of Uncontrolled Type 1 DM: Anion gap closed x 2 yesterday and he was started on Lantus 20u daily. Insulin gtt was discontinued 2 hours after. -NS 125 mL/hr while blood glucose >250. Switch to D5 1/2 NS @125  when CBG <250. -Carb mod diet -Lantus increased to 30u daily -Novolog 10u TID with meals -moderate SSI  Hyperlipidemia: Last lipid panel on 03/22/15 with TG 762 in setting of uncontrolled DM. Pt not on fibrate or statin therapy (was previously on lipitor). No past history of pancreatitis. Current TG is 402.   Dispo: Likely  home today.    LOS: 2 days   Lora PaulaJennifer T Krall, MD 07/24/2015, 7:54 AM

## 2015-07-26 ENCOUNTER — Telehealth: Payer: Self-pay

## 2015-07-26 NOTE — Telephone Encounter (Signed)
Transitional Care Clinic Post-discharge Follow-Up Phone Call:  Attempt # 1  Date of Discharge:07/24/2015 Principal Discharge Diagnosis(es):  DKA, HLD, acute renal failure Post-discharge Communication: (Clearly document all attempts clearly and date contact made) attempted to contact the patient to check on his status and to [facility] @ 1145. Call placed to # [phone] and a HIPAA compliant voice mail message was left requesting a call back to # [phone] or [phone]. Call also placed to # [phone] and the phone rang and eventually asked to enter a remote access code, unable to leave a message. Call Completed: No

## 2015-07-27 ENCOUNTER — Ambulatory Visit: Payer: MEDICAID | Attending: Family Medicine | Admitting: Family Medicine

## 2015-07-27 ENCOUNTER — Encounter: Payer: Self-pay | Admitting: Family Medicine

## 2015-07-27 VITALS — BP 109/75 | HR 70 | Temp 98.7°F | Resp 15 | Ht 66.0 in | Wt 118.2 lb

## 2015-07-27 DIAGNOSIS — E1121 Type 2 diabetes mellitus with diabetic nephropathy: Secondary | ICD-10-CM | POA: Insufficient documentation

## 2015-07-27 DIAGNOSIS — Z9119 Patient's noncompliance with other medical treatment and regimen: Secondary | ICD-10-CM | POA: Insufficient documentation

## 2015-07-27 DIAGNOSIS — E46 Unspecified protein-calorie malnutrition: Secondary | ICD-10-CM | POA: Insufficient documentation

## 2015-07-27 DIAGNOSIS — E43 Unspecified severe protein-calorie malnutrition: Secondary | ICD-10-CM

## 2015-07-27 DIAGNOSIS — E108 Type 1 diabetes mellitus with unspecified complications: Secondary | ICD-10-CM

## 2015-07-27 DIAGNOSIS — E1010 Type 1 diabetes mellitus with ketoacidosis without coma: Secondary | ICD-10-CM

## 2015-07-27 DIAGNOSIS — E1165 Type 2 diabetes mellitus with hyperglycemia: Secondary | ICD-10-CM | POA: Insufficient documentation

## 2015-07-27 DIAGNOSIS — Z794 Long term (current) use of insulin: Secondary | ICD-10-CM | POA: Insufficient documentation

## 2015-07-27 DIAGNOSIS — Z79899 Other long term (current) drug therapy: Secondary | ICD-10-CM | POA: Insufficient documentation

## 2015-07-27 DIAGNOSIS — E1029 Type 1 diabetes mellitus with other diabetic kidney complication: Secondary | ICD-10-CM

## 2015-07-27 DIAGNOSIS — R809 Proteinuria, unspecified: Secondary | ICD-10-CM

## 2015-07-27 LAB — GLUCOSE, POCT (MANUAL RESULT ENTRY)
POC GLUCOSE: 352 mg/dL — AB (ref 70–99)
POC Glucose: 367 mg/dl — AB (ref 70–99)

## 2015-07-27 MED ORDER — INSULIN ASPART 100 UNIT/ML ~~LOC~~ SOLN
6.0000 [IU] | Freq: Once | SUBCUTANEOUS | Status: AC
  Administered 2015-07-27: 6 [IU] via SUBCUTANEOUS

## 2015-07-27 MED ORDER — INSULIN ASPART 100 UNIT/ML ~~LOC~~ SOLN: 6.0000 [IU] | Freq: Once | SUBCUTANEOUS | Status: DC

## 2015-07-27 NOTE — Progress Notes (Signed)
TRANSITIONAL CARE CLINIC  Date of telephone encounter: 07/26/2015  Admit date: 07/22/15 Discharge date: 07/24/15  Subjective:  Patient ID: [name] [name], male    DOB: 09/04/1985  Age: 30 y.o. MRN: [id number]  CC: Diabetes   HPI [name] [name] is a  30-year-old male with a history of type 1 diabetes mellitus (A1c 15.1), diabetic nephropathy, protein calorie malnutrition, with multiple hospitalization for diabetic ketoacidosis.  He had presented with decreased oral intake, weakness, polyuria, polydipsia and had reported hyperglycemia with the highest at 580. He was found to be in diabetic ketoacidosis with a blood sugar of 571 on presentation with ketonuria and was placed on IV normal saline and IV insulin which was gradually transitioned to subcutaneous insulin. His NovoLog was increased to 10 units 3 times daily; his condition improved and he was subsequently discharged.   interval history: He informs me he is currently taking 25 units of Lantus about that than the 30 units he was discharged on because he wasn't sure it was "safe" to administer such a high dose of Lantus. Review of his chart indicates his Lantus had been raised to 30 units previously by his PCP last month. He has no additional complaints at this time neither does he have his blood sugar log with him.  Outpatient Prescriptions Prior to Visit  Medication Sig Dispense Refill  . Blood Glucose Monitoring Suppl (TRUE METRIX METER) w/Device KIT 1 each by Does not apply route as needed. 1 kit 0  . glucose blood (TRUE METRIX BLOOD GLUCOSE TEST) test strip 1 each by Other route 3 (three) times daily. 100 each 11  . insulin aspart (NOVOLOG) 100 UNIT/ML FlexPen Inject 10 Units into the skin 3 (three) times daily with meals. 15 mL 5  . Insulin Glargine (LANTUS) 100 UNIT/ML Solostar Pen Inject 30 Units into the skin daily at 10 pm. 30 mL 0  . Insulin Syringe-Needle U-100 (B-D INS SYRINGE 0.5CC/31GX5/16) 31G X 5/16" 0.5 ML MISC 1 each by  Does not apply route 2 (two) times daily. 60 each 11  . TRUEPLUS LANCETS 28G MISC 1 each by Does not apply route 3 (three) times daily. 100 each 12  . methocarbamol (ROBAXIN) 500 MG tablet Take 2 tablets (1,000 mg total) by mouth 4 (four) times daily as needed (Pain). (Patient not taking: Reported on 07/27/2015) 20 tablet 0   No facility-administered medications prior to visit.    ROS Review of Systems  Constitutional: Negative for activity change and appetite change.  HENT: Negative for sinus pressure and sore throat.   Eyes: Negative for visual disturbance.  Respiratory: Negative for cough, chest tightness and shortness of breath.   Cardiovascular: Negative for chest pain and leg swelling.  Gastrointestinal: Negative for abdominal pain, diarrhea, constipation and abdominal distention.  Endocrine: Negative.   Genitourinary: Negative for dysuria.  Musculoskeletal: Negative for myalgias and joint swelling.  Skin: Negative for rash.  Allergic/Immunologic: Negative.   Neurological: Negative for weakness, light-headedness and numbness.  Psychiatric/Behavioral: Negative for suicidal ideas and dysphoric mood.    Objective:  BP 109/75 mmHg  Pulse 70  Temp(Src) 98.7 F (37.1 C)  Resp 15  Ht 5' 6" (1.676 m)  Wt 118 lb 3.2 oz (53.615 kg)  BMI 19.09 kg/m2  SpO2 95%  BP/Weight 07/27/2015 07/24/2015 07/22/2015  Systolic BP 109 140 -  Diastolic BP 75 98 -  Wt. (Lbs) 118.2 115.52 -  BMI 19.09 - 18.09      Physical Exam  Constitutional: He is oriented to person,   place, and time. He appears well-developed and well-nourished.  Thin appearing  Cardiovascular: Normal rate, normal heart sounds and intact distal pulses.   No murmur heard. Pulmonary/Chest: Effort normal and breath sounds normal. He has no wheezes. He has no rales. He exhibits no tenderness.  Abdominal: Soft. Bowel sounds are normal. He exhibits no distension and no mass. There is no tenderness.  Musculoskeletal: Normal range of  motion.  Neurological: He is alert and oriented to person, place, and time.     Lab Results  Component Value Date   HGBA1C 15.1* 06/15/2015    CMP Latest Ref Rng 07/24/2015 07/23/2015 07/23/2015  Glucose 65 - 99 mg/dL 379(H) 148(H) 122(H)  BUN 6 - 20 mg/dL _0 Creatinine 0.61 - 1.24 mg/dL 0.71 0.79 0.79  Sodium 135 - 145 mmol/L 135 135 134(L)  Potassium 3.5 - 5.1 mmol/L 3.8 3.2(L) 3.1(L)  Chloride 101 - 111 mmol/L 109 109 112(H)  CO2 22 - 32 mmol/L 20(L) 17(L) 15(L)  Calcium 8.9 - 10.3 mg/dL 8.4(L) 8.1(L) 7.7(L)      Assessment & Plan:   1. Type 1 diabetes mellitus with complication (HCC) Uncontrolled with A1c of 15.1 due to noncompliance He has been advised to adhere with prescribed regimen of Lantus-30 units at bedtime, 10 units of NovoLog 3 times daily We'll review blood sugar log next visit Keep blood sugar logs with fasting goals of 80-120 mg/dl, random of less than 180 and in the event of sugars less than 60 mg/dl or greater than 400 mg/dl please notify the clinic ASAP. It is recommended that you undergo annual eye exams and annual foot exams. Pneumovax is recommended every 5 years before the age of 59 and once for a lifetime at or after the age of 74. - Glucose (CBG) - insulin aspart (novoLOG) injection 6 Units; Inject 0.06 mLs (6 Units total) into the skin once. - insulin aspart (novoLOG) injection 6 Units; Inject 0.06 mLs (6 Units total) into the skin once. - Glucose (CBG)  2. Diabetic ketoacidosis without coma associated with type 1 diabetes mellitus ([place]) No evidence of ketoacidosis at this time. 6 units of insulin given for hyperglycemia of 352 and blood sugar repeated after observing the patient for 30 minutes   3. Protein-calorie malnutrition, severe ([place]) Due to poor oral intake. Education provided   4. Proteinuria with type 1 diabetes mellitus (HCC) Proteinuria likely due to poor compliance. He has previously been on hemodialysis in 2013; discussed  complications of diabetes mellitus and encouraged the patient to work on improving control.  Meds ordered this encounter  Medications  . insulin aspart (novoLOG) injection 6 Units    Sig:   . insulin aspart (novoLOG) injection 6 Units    Sig:     Follow-up: Return in about 1 week (around 08/03/2015) for TCC- follow up on DKA.   This note has been created with Surveyor, quantity. Any transcriptional errors are unintentional.    Arnoldo Morale MD

## 2015-07-27 NOTE — Progress Notes (Signed)
Patient has no complaints He takes 25 units Lantus hs and uses Novolog sliding scale Does not need refills

## 2015-08-02 ENCOUNTER — Telehealth: Payer: Self-pay

## 2015-08-02 NOTE — Telephone Encounter (Signed)
Attempted to contact the patient to [facility], 08[facility]/15 @1130 . Call placed to # [phone] (M) and a HIPAA compliant voice mail message was left requesting a call back to # [phone] or [phone].

## 2015-08-03 ENCOUNTER — Ambulatory Visit: Payer: No Typology Code available for payment source | Attending: Family Medicine | Admitting: Family Medicine

## 2015-08-03 ENCOUNTER — Encounter: Payer: Self-pay | Admitting: Family Medicine

## 2015-08-03 VITALS — BP 107/78 | HR 104 | Temp 98.7°F | Resp 15 | Ht 66.0 in | Wt 121.8 lb

## 2015-08-03 DIAGNOSIS — E1010 Type 1 diabetes mellitus with ketoacidosis without coma: Secondary | ICD-10-CM | POA: Insufficient documentation

## 2015-08-03 DIAGNOSIS — Z794 Long term (current) use of insulin: Secondary | ICD-10-CM | POA: Insufficient documentation

## 2015-08-03 DIAGNOSIS — E1065 Type 1 diabetes mellitus with hyperglycemia: Secondary | ICD-10-CM

## 2015-08-03 DIAGNOSIS — Z9119 Patient's noncompliance with other medical treatment and regimen: Secondary | ICD-10-CM | POA: Insufficient documentation

## 2015-08-03 DIAGNOSIS — R809 Proteinuria, unspecified: Secondary | ICD-10-CM | POA: Insufficient documentation

## 2015-08-03 DIAGNOSIS — E43 Unspecified severe protein-calorie malnutrition: Secondary | ICD-10-CM | POA: Insufficient documentation

## 2015-08-03 DIAGNOSIS — E1029 Type 1 diabetes mellitus with other diabetic kidney complication: Secondary | ICD-10-CM

## 2015-08-03 DIAGNOSIS — Z79899 Other long term (current) drug therapy: Secondary | ICD-10-CM | POA: Insufficient documentation

## 2015-08-03 LAB — GLUCOSE, POCT (MANUAL RESULT ENTRY)
POC GLUCOSE: 522 mg/dL — AB (ref 70–99)
POC Glucose: 460 mg/dl — AB (ref 70–99)

## 2015-08-03 MED ORDER — INSULIN GLARGINE 100 UNIT/ML SOLOSTAR PEN: 40.0000 [IU] | PEN_INJECTOR | Freq: Every day | SUBCUTANEOUS | Status: DC

## 2015-08-03 MED ORDER — INSULIN ASPART 100 UNIT/ML ~~LOC~~ SOLN
15.0000 [IU] | Freq: Once | SUBCUTANEOUS | Status: AC
  Administered 2015-08-03: 15 [IU] via SUBCUTANEOUS

## 2015-08-03 NOTE — Progress Notes (Signed)
Blood sugar 522 Took 10 units Novolog at 10 this am after eating cereal States he has been compliant with medications

## 2015-08-03 NOTE — Progress Notes (Signed)
[place]  Date of telephone encounter: 07/26/2015  Admit date: 07/22/15 Discharge date: 07/24/15  Subjective:    Patient ID: [name] [name], male    DOB: 12-04-1985, 30 y.o.   MRN: [id number]  HPI He is a  30 year old male with a history of type 1 diabetes mellitus (A1c 15.1), diabetic nephropathy, protein calorie malnutrition, with multiple hospitalizations for diabetic ketoacidosis. He had presented with decreased oral intake, weakness, polyuria, polydipsia and had reported hyperglycemia with the highest at 580. He was found to be in diabetic ketoacidosis with a blood sugar of 571 on presentation with ketonuria and was placed on IV normal saline and IV insulin which was gradually transitioned to subcutaneous insulin. His NovoLog was increased to 10 units 3 times daily; his condition improved and he was subsequently discharged.  At his last office visit I had increased Lantus from 25 units to 30 units which the patient admits to being compliant with in addition to his NovoLog but his blood sugars through remains elevated today at 522 and he reports eating frosted flakes for breakfast. I have reviewed his blood sugar log which indicates fasting sugars in the range of 164 06/24/2006 and random blood sugars of 210 2-80.  Past Medical History  Diagnosis Date  . Diabetes mellitus without complication ([place]) Dx 4128  . Renal insufficiency 2011    pt was on dialysis for a short time approx 2011, pt is no longer on dialysis    History reviewed. No pertinent past surgical history.  No Known Allergies  Current Outpatient Prescriptions on File Prior to Visit  Medication Sig Dispense Refill  . Blood Glucose Monitoring Suppl (TRUE METRIX METER) w/Device KIT 1 each by Does not apply route as needed. 1 kit 0  . glucose blood (TRUE METRIX BLOOD GLUCOSE TEST) test strip 1 each by Other route 3 (three) times daily. 100 each 11  . insulin aspart (NOVOLOG) 100 UNIT/ML FlexPen Inject 10 Units  into the skin 3 (three) times daily with meals. 15 mL 5  . Insulin Syringe-Needle U-100 (B-D INS SYRINGE 0.5CC/31GX5/16) 31G X 5/16" 0.5 ML MISC 1 each by Does not apply route 2 (two) times daily. 60 each 11  . TRUEPLUS LANCETS 28G MISC 1 each by Does not apply route 3 (three) times daily. 100 each 12   No current facility-administered medications on file prior to visit.      Review of Systems Constitutional: Negative for activity change and appetite change.  HENT: Negative for sinus pressure and sore throat.   Eyes: Negative for visual disturbance.  Respiratory: Negative for cough, chest tightness and shortness of breath.   Cardiovascular: Negative for chest pain and leg swelling.  Gastrointestinal: Negative for abdominal pain, diarrhea, constipation and abdominal distention.  Endocrine: Negative.   Genitourinary: Negative for dysuria.  Musculoskeletal: Negative for myalgias and joint swelling.  Skin: Negative for rash.  Allergic/Immunologic: Negative.   Neurological: Negative for weakness, light-headedness and numbness.  Psychiatric/Behavioral: Negative for suicidal ideas and dysphoric mood.      Objective: Filed Vitals:   08/03/15 1226  BP: 107/78  Pulse: 104  Temp: 98.7 F (37.1 C)  Resp: 15  Height: '5\' 6"'$  (1.676 m)  Weight: 121 lb 12.8 oz (55.248 kg)  SpO2: 98%      Physical Exam Constitutional: He is oriented to person, place, and time. He appears well-developed and well-nourished.  Thin appearing  Cardiovascular: Normal rate, normal heart sounds and intact distal pulses.   No murmur heard. Pulmonary/Chest: Effort  normal and breath sounds normal. He has no wheezes. He has no rales. He exhibits no tenderness.  Abdominal: Soft. Bowel sounds are normal. He exhibits no distension and no mass. There is no tenderness.  Musculoskeletal: Normal range of motion.  Neurological: He is alert and oriented to person, place, and time.          Assessment & Plan:  1. Type  1 diabetes mellitus with complication (HCC) Uncontrolled with A1c of 15.1 due to noncompliance and dietary indiscretion 15 units of NovoLog given in the clinic and blood sugar repeated Increase Lantus to 40 units at bedtime and continue 10 units of NovoLog 3 times daily We'll review blood sugar log next visit Keep blood sugar logs with fasting goals of 80-120 mg/dl, random of less than 180 and in the event of sugars less than 60 mg/dl or greater than 400 mg/dl please notify the clinic ASAP. It is recommended that you undergo annual eye exams and annual foot exams. Pneumovax is recommended every 5 years before the age of 83 and once for a lifetime at or after the age of 44. Educated about dietary choices and diabetic diet. Scheduled to see clinical pharmacist for diabetic teaching - Glucose (CBG) - insulin aspart (novoLOG) injection 6 Units; Inject 0.06 mLs (6 Units total) into the skin once. - insulin aspart (novoLOG) injection 6 Units; Inject 0.06 mLs (6 Units total) into the skin once. - Glucose (CBG)  2. Protein-calorie malnutrition, severe ([place]) Due to poor oral intake. Education provided   3. Proteinuria with type 1 diabetes mellitus (HCC) Proteinuria likely due to poor compliance. He has previously been on hemodialysis in 2637; discussed complications of diabetes mellitus and encouraged the patient to work on improving control.

## 2015-08-03 NOTE — Patient Instructions (Signed)
Diabetes Mellitus and Food It is important for you to manage your blood sugar (glucose) level. Your blood glucose level can be greatly affected by what you eat. Eating healthier foods in the appropriate amounts throughout the day at about the same time each day will help you control your blood glucose level. It can also help slow or prevent worsening of your diabetes mellitus. Healthy eating may even help you improve the level of your blood pressure and reach or maintain a healthy weight.  General recommendations for healthful eating and cooking habits include:  Eating meals and snacks regularly. Avoid going long periods of time without eating to lose weight.  Eating a diet that consists mainly of plant-based foods, such as fruits, vegetables, nuts, legumes, and whole grains.  Using low-heat cooking methods, such as baking, instead of high-heat cooking methods, such as deep frying. Work with your dietitian to make sure you understand how to use the Nutrition Facts information on food labels. HOW CAN FOOD AFFECT ME? Carbohydrates Carbohydrates affect your blood glucose level more than any other type of food. Your dietitian will help you determine how many carbohydrates to eat at each meal and teach you how to count carbohydrates. Counting carbohydrates is important to keep your blood glucose at a healthy level, especially if you are using insulin or taking certain medicines for diabetes mellitus. Alcohol Alcohol can cause sudden decreases in blood glucose (hypoglycemia), especially if you use insulin or take certain medicines for diabetes mellitus. Hypoglycemia can be a life-threatening condition. Symptoms of hypoglycemia (sleepiness, dizziness, and disorientation) are similar to symptoms of having too much alcohol.  If your health care provider has given you approval to drink alcohol, do so in moderation and use the following guidelines:  Women should not have more than one drink per day, and men  should not have more than two drinks per day. One drink is equal to:  12 oz of beer.  5 oz of wine.  1 oz of hard liquor.  Do not drink on an empty stomach.  Keep yourself hydrated. Have water, diet soda, or unsweetened iced tea.  Regular soda, juice, and other mixers might contain a lot of carbohydrates and should be counted. WHAT FOODS ARE NOT RECOMMENDED? As you make food choices, it is important to remember that all foods are not the same. Some foods have fewer nutrients per serving than other foods, even though they might have the same number of calories or carbohydrates. It is difficult to get your body what it needs when you eat foods with fewer nutrients. Examples of foods that you should avoid that are high in calories and carbohydrates but low in nutrients include:  Trans fats (most processed foods list trans fats on the Nutrition Facts label).  Regular soda.  Juice.  Candy.  Sweets, such as cake, pie, doughnuts, and cookies.  Fried foods. WHAT FOODS CAN I EAT? Eat nutrient-rich foods, which will nourish your body and keep you healthy. The food you should eat also will depend on several factors, including:  The calories you need.  The medicines you take.  Your weight.  Your blood glucose level.  Your blood pressure level.  Your cholesterol level. You should eat a variety of foods, including:  Protein.  Lean cuts of meat.  Proteins low in saturated fats, such as fish, egg whites, and beans. Avoid processed meats.  Fruits and vegetables.  Fruits and vegetables that may help control blood glucose levels, such as apples, mangoes, and   yams.  Dairy products.  Choose fat-free or low-fat dairy products, such as milk, yogurt, and cheese.  Grains, bread, pasta, and rice.  Choose whole grain products, such as multigrain bread, whole oats, and brown rice. These foods may help control blood pressure.  Fats.  Foods containing healthful fats, such as nuts,  avocado, olive oil, canola oil, and fish. DOES EVERYONE WITH DIABETES MELLITUS HAVE THE SAME MEAL PLAN? Because every person with diabetes mellitus is different, there is not one meal plan that works for everyone. It is very important that you meet with a dietitian who will help you create a meal plan that is just right for you.   This information is not intended to replace advice given to you by your health care provider. Make sure you discuss any questions you have with your health care provider.   Document Released: 02/02/2005 Document Revised: 05/29/2014 Document Reviewed: 04/04/2013 Elsevier Interactive Patient Education 2016 [employer] Inc[employer]  

## 2015-08-06 ENCOUNTER — Telehealth: Payer: Self-pay

## 2015-08-06 NOTE — Telephone Encounter (Signed)
This Case Manager placed call to patient to check on status and to remind him of upcoming appointment on 08/10/15 at 1200 with Juanita CraverStacey Karl, Clinical Pharmacist. Patient indicated he was aware of his upcoming appointment. Inquired if patient being compliant with his insulin regimen. Patient indicated he has been administering Lantus 40 U at night and Novolog 10 U three times daily as prescribed. Patient indicated his blood glucose was "much better than when it used to be in the 300's and 400's." He indicated his blood glucose has been ranging from 160-230 and "averaging in the 200's." Patient indicated he was keeping a log of his blood glucoses. Instructed patient to bring his log to his upcoming appointment on 08/10/15 with Juanita CraverStacey Karl, Clinical Pharmacist. Patient verbalized understanding. No additional needs/concerns identified.

## 2015-08-09 ENCOUNTER — Telehealth: Payer: Self-pay

## 2015-08-09 NOTE — Telephone Encounter (Signed)
Call placed to the patient and reminded him of his appointment tomorrow, 08/10/15 @ 1200  [facility].  He said that he is aware of the appointment and has transportation to the clinic.  Instructed him to bring his meter and his blood sugar log to the clinic with him and he stated that he would .  No problems/ questions reported at this time.

## 2015-08-10 ENCOUNTER — Ambulatory Visit: Payer: No Typology Code available for payment source | Attending: Family Medicine | Admitting: Pharmacist

## 2015-08-10 DIAGNOSIS — G629 Polyneuropathy, unspecified: Secondary | ICD-10-CM | POA: Insufficient documentation

## 2015-08-10 DIAGNOSIS — E119 Type 2 diabetes mellitus without complications: Secondary | ICD-10-CM | POA: Insufficient documentation

## 2015-08-10 DIAGNOSIS — R351 Nocturia: Secondary | ICD-10-CM | POA: Insufficient documentation

## 2015-08-10 DIAGNOSIS — Z794 Long term (current) use of insulin: Secondary | ICD-10-CM | POA: Insufficient documentation

## 2015-08-10 DIAGNOSIS — E108 Type 1 diabetes mellitus with unspecified complications: Secondary | ICD-10-CM

## 2015-08-10 LAB — POCT URINALYSIS DIPSTICK
Bilirubin, UA: NEGATIVE
GLUCOSE UA: 500
Ketones, UA: >=160
LEUKOCYTES UA: NEGATIVE
NITRITE UA: NEGATIVE
Protein, UA: NEGATIVE
RBC UA: NEGATIVE
Spec Grav, UA: 1.01
UROBILINOGEN UA: 0.2
pH, UA: 5

## 2015-08-10 LAB — GLUCOSE, POCT (MANUAL RESULT ENTRY): POC Glucose: 545 mg/dl — AB (ref 70–99)

## 2015-08-10 MED ORDER — INSULIN ASPART 100 UNIT/ML ~~LOC~~ SOLN
10.0000 [IU] | Freq: Once | SUBCUTANEOUS | Status: AC
  Administered 2015-08-10: 10 [IU] via SUBCUTANEOUS

## 2015-08-10 NOTE — Progress Notes (Signed)
S:    Patient arrives in good spirits.  Presents for diabetes evaluation, education, and management at the request of Dr. Venetia NightAmao. Patient was referred on 08/03/15.  Patient was last seen by Primary Care Provider on 06/28/15.   Patient reports adherence with medications. However, upon further interview, patient admits to missing Lantus last night due to falling asleep. Current diabetes medications include: Lantus 40 units and Novolog 10 units with each meal.  Patient denies hypoglycemic events.  Patient reported dietary habits: patient is trying to eat better but still eats a lot of snacks with carbs.  Patient reported exercise habits: none   Patient reports nocturia.  Patient reports neuropathy. Patient reports visual changes. Patient reports self foot exams.   Patient brings with him a meter with a few readings that are from a few months ago. He reported that his blood glucose was 180s fasting and 220s post-prandial but these readings are not on the meter and he does not have another meter with him.   O:  Lab Results  Component Value Date   HGBA1C 15.1* 06/15/2015   There were no vitals filed for this visit.  Per meter - patient has not been measuring CBGs at home Home fasting CBG:  2 hour post-prandial/random CBG:   POCT glucose 545  A/P: Type 1 Diabetes longstanding currently UNcontrolled based on A1c of 15.1. Patient denies hypoglycemic events and is able to verbalize appropriate hypoglycemia management plan. Patient reports adherence with medication but does not appear to be adherence based on CBGs and further interview. Patient also not adherent with monitoring blood glucose at home per his meter. Control is suboptimal due to noncompliance, sedentary lifestyle, and dietary indiscretion.  Hyperglycemia and urine positive for ketones. Administered Novolog 10 units x 1. Cognitive aware and able to function independently.    Continued basal insulin Lantus (insulin glargine) 40  units daily. Continued rapid insulin Novolog (insulin aspart) 10 units daily. Strongly encouraged compliance with his medications to avoid repeated admissions to the hospital and possible death due to complications from DKA.  Discussed patient case with Dr. Armen PickupFunches who requested that patient be sent to ED for further evaluation. Lance BoschAnna Smythe, RN, called report to ED. Patient able to ambulate to ED per Dr. Armen PickupFunches so gave patient instructions to report to ED. Patient verbalized understanding.    Next A1C anticipated April 2017.    Written patient instructions provided.  Total time in face to face counseling 30 minutes.   Follow up in Pharmacist Clinic Visit PRN.   Patient seen with Theresa MulliganMartin Shaughnessy, PharmD Candidate

## 2015-08-10 NOTE — Patient Instructions (Signed)
Go to the emergency room

## 2015-08-11 ENCOUNTER — Telehealth: Payer: Self-pay

## 2015-08-11 NOTE — Telephone Encounter (Signed)
This Case Manager placed call to patient to check on status and to instruct patient to go to ED as patient did not go to ED on 08/10/15 at instructed by Juanita CraverStacey Karl, Clinical Pharmacist for DKA. Call placed to #[phone]; unable to reach. HIPPA compliant voicemail left requesting return call. In addition, call placed to #[phone]; an additional HIPPA compliant voicemail left requesting return call. Awaiting return call from patient.

## 2015-08-13 ENCOUNTER — Telehealth: Payer: Self-pay

## 2015-08-13 NOTE — Telephone Encounter (Signed)
Attempted to contact  the patient to check on his status. Call placed to # [phone] (M) and a HIPAA compliant voice mail message was left requesting a call back to # [phone] or [phone].

## 2015-08-16 ENCOUNTER — Telehealth: Payer: Self-pay

## 2015-08-16 NOTE — Telephone Encounter (Signed)
Call placed to the patient to check on his status.  He said he is " feeling fine." He noted that he did not go to the ED on 08/10/15 because he had already " missed too much work" and he is " falling behind on his bills."   He said that he has all of his medicine and reported that he is compliant with taking all of his medications as ordered including the insulin. He also said that he has been checking his blood sugars 3-4 times a day and the results have been 150-270.  He said that they have not been as high as they were and " no 400's, 500's."  He said that he sometimes works days and sometimes he works nights and checks his blood sugars as much as he can. Instructed him regarding the importance of monitoring his blood sugars and taking his insulin and medications as ordered. He did not want to schedule another follow up appointment at this time; but was agreeable to having CM call him to check on him.   Discussed the availability of financial counseling to assist with applications [facility] if he is eligible and also explained that there are walk in hours for financial counseling if that is more convenient for him. He noted again that he did not want to miss any work right now.   No concerns/questions reported at this time.  He was appreciative of the call.

## 2015-08-25 ENCOUNTER — Telehealth: Payer: Self-pay

## 2015-08-25 NOTE — Telephone Encounter (Signed)
Call placed to the patient to check on his status. He said that he is doing " pretty good."  He noted that he has been checking his blood sugars and they have " come down. He said that he has been keeping the blood sugar log and he has all of his medications, including the insulin, and has been taking them as ordered. He reported that his blood sugars have ranged from 175-200. He said that he will be out of his novolog soon and was not sure how to re-order. Explained to him the process [facility] and he then stated that he knew what to do.   He said that he [facility] because he needs to find out his schedule for next week. He did state that he wants to schedule an appointment and knows it is important to see the doctor but he can't miss work at this time. Also reminded him that [facility] and [facility] when he comes to pick up his medications.  He stated that he understood but again noted that he does not have time to schedule an appointment with the financial counselor yet.  No other problems/questions reported.

## 2015-09-01 MED FILL — !NOVOLOG FLEXPEN SYRINGE 1: 100/ML | 30 days supply | Qty: 9 | Fill #0

## 2015-10-13 MED FILL — !NOVOLOG FLEXPEN SYRINGE 1: 100/ML | 30 days supply | Qty: 9 | Fill #1

## 2015-10-13 MED FILL — !LANTUS SOLOSTAR 100UNITS/M: 100 | 28 days supply | Qty: 9 | Fill #0

## 2015-12-01 ENCOUNTER — Observation Stay (HOSPITAL_COMMUNITY): Payer: Self-pay

## 2015-12-01 ENCOUNTER — Encounter (HOSPITAL_COMMUNITY): Payer: Self-pay

## 2015-12-01 ENCOUNTER — Inpatient Hospital Stay (HOSPITAL_COMMUNITY)
Admission: EM | Admit: 2015-12-01 | Discharge: 2015-12-03 | DRG: 638 | Disposition: A | Discharge status: Home / Self Care | Payer: Self-pay | Attending: Internal Medicine | Admitting: Internal Medicine

## 2015-12-01 DIAGNOSIS — E876 Hypokalemia: Secondary | ICD-10-CM | POA: Diagnosis present

## 2015-12-01 DIAGNOSIS — E1110 Type 2 diabetes mellitus with ketoacidosis without coma: Secondary | ICD-10-CM

## 2015-12-01 DIAGNOSIS — E860 Dehydration: Secondary | ICD-10-CM | POA: Diagnosis present

## 2015-12-01 DIAGNOSIS — F1210 Cannabis abuse, uncomplicated: Secondary | ICD-10-CM | POA: Diagnosis present

## 2015-12-01 DIAGNOSIS — Z87891 Personal history of nicotine dependence: Secondary | ICD-10-CM

## 2015-12-01 DIAGNOSIS — E1010 Type 1 diabetes mellitus with ketoacidosis without coma: Principal | ICD-10-CM | POA: Diagnosis present

## 2015-12-01 DIAGNOSIS — E1043 Type 1 diabetes mellitus with diabetic autonomic (poly)neuropathy: Secondary | ICD-10-CM | POA: Diagnosis present

## 2015-12-01 DIAGNOSIS — Z794 Long term (current) use of insulin: Secondary | ICD-10-CM

## 2015-12-01 DIAGNOSIS — N179 Acute kidney failure, unspecified: Secondary | ICD-10-CM | POA: Diagnosis present

## 2015-12-01 DIAGNOSIS — K3184 Gastroparesis: Secondary | ICD-10-CM | POA: Diagnosis present

## 2015-12-01 DIAGNOSIS — Z833 Family history of diabetes mellitus: Secondary | ICD-10-CM

## 2015-12-01 LAB — URINALYSIS, ROUTINE W REFLEX MICROSCOPIC
BILIRUBIN URINE: NEGATIVE
Ketones, ur: >80 mg/dL — AB
Leukocytes, UA: NEGATIVE
NITRITE: NEGATIVE
PH: 5 (ref 5.0–8.0)
Protein, ur: 30 mg/dL — AB
SPECIFIC GRAVITY, URINE: 1.031 — AB (ref 1.005–1.030)

## 2015-12-01 LAB — CBC
HEMATOCRIT: 48.4 % (ref 39.0–52.0)
HEMOGLOBIN: 16.8 g/dL (ref 13.0–17.0)
MCH: 31.4 pg (ref 26.0–34.0)
MCHC: 34.7 g/dL (ref 30.0–36.0)
MCV: 90.5 fL (ref 78.0–100.0)
Platelets: 286 10*3/uL (ref 150–400)
RBC: 5.35 MIL/uL (ref 4.22–5.81)
RDW: 12.8 % (ref 11.5–15.5)
WBC: 10.8 10*3/uL — ABNORMAL HIGH (ref 4.0–10.5)

## 2015-12-01 LAB — BASIC METABOLIC PANEL
BUN: 23 mg/dL — ABNORMAL HIGH (ref 6–20)
CHLORIDE: 102 mmol/L (ref 101–111)
Calcium: 9.1 mg/dL (ref 8.9–10.3)
Creatinine, Ser: 1.53 mg/dL — ABNORMAL HIGH (ref 0.61–1.24)
GFR calc Af Amer: >60 mL/min (ref >60)
GFR, EST NON AFRICAN AMERICAN: 59 mL/min — AB (ref >60)
GLUCOSE: 403 mg/dL — AB (ref 65–99)
POTASSIUM: 4.2 mmol/L (ref 3.5–5.1)
Sodium: 131 mmol/L — ABNORMAL LOW (ref 135–145)

## 2015-12-01 LAB — URINE MICROSCOPIC-ADD ON
RBC / HPF: NONE SEEN RBC/hpf (ref 0–5)
WBC UA: NONE SEEN WBC/hpf (ref 0–5)

## 2015-12-01 LAB — I-STAT VENOUS BLOOD GAS, ED
Acid-base deficit: 21 mmol/L — ABNORMAL HIGH (ref 0.0–2.0)
Bicarbonate: 8.4 mEq/L — ABNORMAL LOW (ref 20.0–24.0)
O2 SAT: 60 %
TCO2: 9 mmol/L (ref 0–100)
pCO2, Ven: 29.3 mmHg — ABNORMAL LOW (ref 45.0–50.0)
pH, Ven: 7.066 — CL (ref 7.250–7.300)
pO2, Ven: 43 mmHg (ref 31.0–45.0)

## 2015-12-01 LAB — CBG MONITORING, ED
GLUCOSE-CAPILLARY: 226 mg/dL — AB (ref 65–99)
Glucose-Capillary: 408 mg/dL — ABNORMAL HIGH (ref 65–99)
Glucose-Capillary: 370 mg/dL — ABNORMAL HIGH (ref 65–99)
Glucose-Capillary: 274 mg/dL — ABNORMAL HIGH (ref 65–99)

## 2015-12-01 MED ORDER — DEXTROSE-NACL 5-0.45 % IV SOLN: INTRAVENOUS | Status: DC

## 2015-12-01 MED ORDER — SODIUM CHLORIDE 0.9 % IV SOLN
INTRAVENOUS | Status: DC
  Administered 2015-12-01: 2.1 [IU]/h via INTRAVENOUS
  Filled 2015-12-01: qty 2.5

## 2015-12-01 MED ORDER — SODIUM CHLORIDE 0.9 % IV BOLUS (SEPSIS)
1000.0000 mL | Freq: Once | INTRAVENOUS | Status: AC
  Administered 2015-12-01: 1000 mL via INTRAVENOUS

## 2015-12-01 MED ORDER — POTASSIUM CHLORIDE 10 MEQ/100ML IV SOLN
10.0000 meq | INTRAVENOUS | Status: AC
  Administered 2015-12-01 – 2015-12-02 (×3): 10 meq via INTRAVENOUS
  Filled 2015-12-01 (×3): qty 100

## 2015-12-01 MED ORDER — ONDANSETRON 4 MG PO TBDP
ORAL_TABLET | ORAL | Status: AC
  Administered 2015-12-01: 4 mg via ORAL
  Filled 2015-12-01: qty 1

## 2015-12-01 MED ORDER — SODIUM CHLORIDE 0.9 % IV SOLN
INTRAVENOUS | Status: DC
  Administered 2015-12-01: 3.3 [IU]/h via INTRAVENOUS
  Filled 2015-12-01: qty 2.5

## 2015-12-01 MED ORDER — SODIUM CHLORIDE 0.9 % IV SOLN: INTRAVENOUS | Status: DC

## 2015-12-01 MED ORDER — DEXTROSE-NACL 5-0.45 % IV SOLN
INTRAVENOUS | Status: DC
Start: 2015-12-01 — End: 2015-12-03
  Administered 2015-12-02 (×2): via INTRAVENOUS

## 2015-12-01 MED ORDER — SODIUM CHLORIDE 0.9 % IV SOLN
INTRAVENOUS | Status: DC
  Administered 2015-12-03: 02:00:00 via INTRAVENOUS

## 2015-12-01 MED ORDER — ENOXAPARIN SODIUM 40 MG/0.4ML ~~LOC~~ SOLN
40.0000 mg | Freq: Every day | SUBCUTANEOUS | Status: DC
  Administered 2015-12-02: 40 mg via SUBCUTANEOUS
  Filled 2015-12-01 (×2): qty 0.4

## 2015-12-01 MED ORDER — ONDANSETRON 4 MG PO TBDP
4.0000 mg | ORAL_TABLET | Freq: Once | ORAL | Status: AC
  Administered 2015-12-01: 4 mg via ORAL

## 2015-12-01 NOTE — H&P (Signed)
History and Physical    Benjamen Koelling QPR:[id number] DOB: 26-Apr-1986 DOA: 12/01/2015  PCP: Minerva Ends, MD  Patient coming from: Home.  Chief Complaint: Elevated blood sugar.  HPI: [name] [name] is a 30 y.o. male with diabetes mellitus type 1 presents to the ER because of elevated blood sugar. Patient states he missed his Lantus insulin yesterday and since then has been having elevated blood sugar which has been persistently elevated. Had one episode of nausea vomiting denies any chest pain shortness of breath productive cough. Has been having some running nose. Patient has been afebrile in the ER. Labs reveal elevated blood sugar more than 400 with bicarbonate less than 7. Patient is not in distress. Patient is admitted for severe diabetic ketoacidosis.   ED Course: Was given 3 L fluid bolus and started on IV insulin infusion.  Review of Systems: As per HPI, rest all negative.   Past Medical History  Diagnosis Date  . Diabetes mellitus without complication ([place]) Dx [id number]  . Renal insufficiency 2011    pt was on dialysis for a short time approx 2011, pt is no longer on dialysis    History reviewed. No pertinent past surgical history.   reports that he quit smoking about 2 years ago. He has never used smokeless tobacco. He reports that he does not drink alcohol or use illicit drugs.  No Known Allergies  Family History  Problem Relation Age of Onset  . Diabetes Father   . Diabetes Maternal Grandmother   . Heart disease Neg Hx   . Hypertension Neg Hx     Prior to Admission medications   Medication Sig Start Date End Date Taking? Authorizing Provider  Blood Glucose Monitoring Suppl (TRUE METRIX METER) w/Device KIT 1 each by Does not apply route as needed. 06/28/15  Yes Josalyn Funches, MD  glucose blood (TRUE METRIX BLOOD GLUCOSE TEST) test strip 1 each by Other route 3 (three) times daily. 06/28/15  Yes Josalyn Funches, MD  insulin aspart (NOVOLOG) 100 UNIT/ML FlexPen Inject  10 Units into the skin 3 (three) times daily with meals. 06/29/15  Yes Josalyn Funches, MD  Insulin Glargine (LANTUS) 100 UNIT/ML Solostar Pen Inject 40 Units into the skin daily at 10 pm. 08/03/15  Yes Arnoldo Morale, MD  Insulin Syringe-Needle U-100 (B-D INS SYRINGE 0.5CC/31GX5/16) 31G X 5/16" 0.5 ML MISC 1 each by Does not apply route 2 (two) times daily. 06/18/15  Yes Nishant Dhungel, MD  naproxen sodium (ALEVE) 220 MG tablet Take 440 mg by mouth 2 (two) times daily as needed (for headaches).   Yes Historical Provider, MD  TRUEPLUS LANCETS 28G MISC 1 each by Does not apply route 3 (three) times daily. 06/18/15  Yes Nishant Dhungel, MD    Physical Exam: Filed Vitals:   12/01/15 2215 12/01/15 2230 12/01/15 2245 12/01/15 2300  BP: 146/95 143/99 142/90 130/95  Pulse: 98 107 106 106  Temp:      TempSrc:      Resp: _0 Height:      Weight:      SpO2: 100% 100% 100% 100%      Constitutional: Not in distress. Filed Vitals:   12/01/15 2215 12/01/15 2230 12/01/15 2245 12/01/15 2300  BP: 146/95 143/99 142/90 130/95  Pulse: 98 107 106 106  Temp:      TempSrc:      Resp: _1 Height:      Weight:      SpO2: 100%  100% 100% 100%   Eyes: Anicteric no pallor. ENMT: No discharge from the ears eyes nose or mouth. Neck: No mass felt. No JVD appreciated. Respiratory: No rhonchi or crepitations. Cardiovascular: S1 and S2 heard. Abdomen: Soft nontender bowel sounds present. Musculoskeletal: No edema. Skin: No rash. Neurologic: Alert awake oriented to time place and person. Moves all extremities. Psychiatric: Appears normal.   Labs on Admission: I have personally reviewed following labs and imaging studies  CBC:  Recent Labs Lab 12/01/15 1904  WBC 10.8*  HGB 16.8  HCT 48.4  MCV 90.5  PLT 086   Basic Metabolic Panel:  Recent Labs Lab 12/01/15 1904  NA 131*  K 4.2  CL 102  CO2 <7*  GLUCOSE 403*  BUN 23*  CREATININE 1.53*  CALCIUM 9.1   GFR: Estimated  Creatinine Clearance: 50.3 mL/min (by C-G formula based on Cr of 1.53). Liver Function Tests: No results for input(s): AST, ALT, ALKPHOS, BILITOT, PROT, ALBUMIN in the last 168 hours. No results for input(s): LIPASE, AMYLASE in the last 168 hours. No results for input(s): AMMONIA in the last 168 hours. Coagulation Profile: No results for input(s): INR, PROTIME in the last 168 hours. Cardiac Enzymes: No results for input(s): CKTOTAL, CKMB, CKMBINDEX, TROPONINI in the last 168 hours. BNP (last 3 results) No results for input(s): PROBNP in the last 8760 hours. HbA1C: No results for input(s): HGBA1C in the last 72 hours. CBG:  Recent Labs Lab 12/01/15 1839 12/01/15 2008 12/01/15 2212  GLUCAP 408* 370* 274*   Lipid Profile: No results for input(s): CHOL, HDL, LDLCALC, TRIG, CHOLHDL, LDLDIRECT in the last 72 hours. Thyroid Function Tests: No results for input(s): TSH, T4TOTAL, FREET4, T3FREE, THYROIDAB in the last 72 hours. Anemia Panel: No results for input(s): VITAMINB12, FOLATE, FERRITIN, TIBC, IRON, RETICCTPCT in the last 72 hours. Urine analysis:    Component Value Date/Time   COLORURINE YELLOW 12/01/2015 1908   APPEARANCEUR CLEAR 12/01/2015 1908   LABSPEC 1.031* 12/01/2015 1908   PHURINE 5.0 12/01/2015 1908   GLUCOSEU >1000* 12/01/2015 1908   HGBUR TRACE* 12/01/2015 1908   BILIRUBINUR NEGATIVE 12/01/2015 1908   BILIRUBINUR neg 08/10/2015 1211   KETONESUR >80* 12/01/2015 1908   PROTEINUR 30* 12/01/2015 1908   PROTEINUR neg 08/10/2015 1211   UROBILINOGEN 0.2 08/10/2015 1211   UROBILINOGEN 0.2 03/22/2015 0913   NITRITE NEGATIVE 12/01/2015 1908   NITRITE neg 08/10/2015 1211   LEUKOCYTESUR NEGATIVE 12/01/2015 1908   Sepsis Labs: _0 (procalcitonin:4,lacticidven:4) )No results found for this or any previous visit (from the past 240 hour(s)).   Radiological Exams on Admission: No results found.   Assessment/Plan Principal Problem:   Diabetic ketoacidosis  without coma associated with type 1 diabetes mellitus ([place]) Active Problems:   Acute renal failure ([place])   DKA (diabetic ketoacidoses) ([place])    1. Severe diabetic ketoacidosis in type 1 diabetes - probably secondary to noncompliance with medication. Patient missed his Lantus dose yesterday. Patient has received 3 L normal saline bolus and is on IV fluid infusion and IV insulin infusion. Once anion gap is corrected will change back to subcutaneous insulin. Patient advised to be compliant with medication. Closely follow metabolic panel. 2. Acute renal failure probably from nausea vomiting - continue with hydration closely follow metabolic panel. 3. Elevated blood pressure - closely follow blood pressure trends.   DVT prophylaxis: Lovenox. Code Status: Full code.  Family Communication: No family at the bedside.  Disposition Plan: Home.  Consults called: None.  Admission status: Observation. Stepdown.  Rise Patience MD [facility] Pager [phone].  If 7PM-7AM, please contact night-coverage [email or web address] Password [email or web address]  12/01/2015, 11:11 PM

## 2015-12-01 NOTE — ED Notes (Signed)
Patient here with increased fatigue, emesis today and blood sugar high since Monday. Patient alert and oriented but pale on arrival. CBG 408. Has taken last dose at insulin 1 hour pta

## 2015-12-01 NOTE — ED Provider Notes (Signed)
CSN: [id number]     Arrival date & time 12/01/15  1831 History   First MD Initiated Contact with Patient 12/01/15 2113     Chief Complaint  Patient presents with  . Hyperglycemia  . Emesis     (Consider location/radiation/quality/duration/timing/severity/associated sxs/prior Treatment) Patient is a 30 y.o. male presenting with hyperglycemia.  Hyperglycemia Blood sugar level PTA:  400s Severity:  Severe Onset quality:  Gradual Duration:  2 days Timing:  Constant Progression:  Unchanged Chronicity:  Recurrent Diabetes status:  Controlled with insulin Context: noncompliance (miseed one dose prior to symptoms) and recent illness (mild nasal congestion)   Context: not change in medication and not recent change in diet   Associated symptoms: fatigue, increased thirst, nausea, polyuria and vomiting   Associated symptoms: no abdominal pain, no chest pain, no fever and no shortness of breath   Risk factors: hx of DKA     Past Medical History  Diagnosis Date  . Diabetes mellitus without complication ([place]) Dx 7628  . Renal insufficiency 2011    pt was on dialysis for a short time approx 2011, pt is no longer on dialysis   History reviewed. No pertinent past surgical history. Family History  Problem Relation Age of Onset  . Diabetes Father   . Diabetes Maternal Grandmother   . Heart disease Neg Hx   . Hypertension Neg Hx    Social History  Substance Use Topics  . Smoking status: Former Smoker    Quit date: 09/19/2013  . Smokeless tobacco: Never Used  . Alcohol Use: No    Review of Systems  Constitutional: Positive for fatigue. Negative for fever.  HENT: Positive for congestion. Negative for sore throat.   Eyes: Negative for visual disturbance.  Respiratory: Negative for shortness of breath.   Cardiovascular: Negative for chest pain.  Gastrointestinal: Positive for nausea and vomiting. Negative for abdominal pain.  Endocrine: Positive for polydipsia and polyuria.   Genitourinary: Negative for difficulty urinating.  Musculoskeletal: Negative for back pain and neck stiffness.  Skin: Negative for rash.  Neurological: Positive for headaches (began slowly, no trauma, no neuro symptoms). Negative for syncope.      Allergies  Review of patient's allergies indicates no known allergies.  Home Medications   Prior to Admission medications   Medication Sig Start Date End Date Taking? Authorizing Provider  Blood Glucose Monitoring Suppl (TRUE METRIX METER) w/Device KIT 1 each by Does not apply route as needed. 06/28/15  Yes Josalyn Funches, MD  glucose blood (TRUE METRIX BLOOD GLUCOSE TEST) test strip 1 each by Other route 3 (three) times daily. 06/28/15  Yes Josalyn Funches, MD  insulin aspart (NOVOLOG) 100 UNIT/ML FlexPen Inject 10 Units into the skin 3 (three) times daily with meals. 06/29/15  Yes Josalyn Funches, MD  Insulin Glargine (LANTUS) 100 UNIT/ML Solostar Pen Inject 40 Units into the skin daily at 10 pm. 08/03/15  Yes Arnoldo Morale, MD  Insulin Syringe-Needle U-100 (B-D INS SYRINGE 0.5CC/31GX5/16) 31G X 5/16" 0.5 ML MISC 1 each by Does not apply route 2 (two) times daily. 06/18/15  Yes Nishant Dhungel, MD  naproxen sodium (ALEVE) 220 MG tablet Take 440 mg by mouth 2 (two) times daily as needed (for headaches).   Yes Historical Provider, MD  TRUEPLUS LANCETS 28G MISC 1 each by Does not apply route 3 (three) times daily. 06/18/15  Yes Nishant Dhungel, MD   BP 133/77 mmHg  Pulse 77  Temp(Src) 98.1 F (36.7 C) (Oral)  Resp 18  Ht 5'  6" (1.676 m)  Wt 113 lb (51.256 kg)  BMI 18.25 kg/m2  SpO2 100% Physical Exam  Constitutional: He is oriented to person, place, and time. He appears well-developed and well-nourished. No distress.  HENT:  Head: Normocephalic and atraumatic.  Eyes: Conjunctivae and EOM are normal.  Neck: Normal range of motion.  Cardiovascular: Regular rhythm, normal heart sounds and intact distal pulses.  Tachycardia present.  Exam reveals  no gallop and no friction rub.   No murmur heard. Pulmonary/Chest: Effort normal and breath sounds normal. No respiratory distress. He has no wheezes. He has no rales.  Abdominal: Soft. He exhibits no distension. There is no tenderness. There is no guarding.  Musculoskeletal: He exhibits no edema.  Neurological: He is alert and oriented to person, place, and time.  Skin: Skin is warm and dry. He is not diaphoretic.  Nursing note and vitals reviewed.   ED Course  Procedures (including critical care time) Labs Review Labs Reviewed  BASIC METABOLIC PANEL - Abnormal; Notable for the following:    Sodium 131 (*)    CO2 <7 (*)    Glucose, Bld 403 (*)    BUN 23 (*)    Creatinine, Ser 1.53 (*)    GFR calc non Af Amer 59 (*)    All other components within normal limits  CBC - Abnormal; Notable for the following:    WBC 10.8 (*)    All other components within normal limits  URINALYSIS, ROUTINE W REFLEX MICROSCOPIC (NOT AT [facility]) - Abnormal; Notable for the following:    Specific Gravity, Urine 1.031 (*)    Glucose, UA >1000 (*)    Hgb urine dipstick TRACE (*)    Ketones, ur >80 (*)    Protein, ur 30 (*)    All other components within normal limits  URINE MICROSCOPIC-ADD ON - Abnormal; Notable for the following:    Squamous Epithelial / LPF 0-5 (*)    Bacteria, UA RARE (*)    All other components within normal limits  BASIC METABOLIC PANEL - Abnormal; Notable for the following:    Chloride 114 (*)    CO2 11 (*)    Glucose, Bld 183 (*)    Calcium 7.1 (*)    All other components within normal limits  BASIC METABOLIC PANEL - Abnormal; Notable for the following:    Chloride 113 (*)    CO2 14 (*)    Glucose, Bld 181 (*)    Calcium 7.1 (*)    All other components within normal limits  BASIC METABOLIC PANEL - Abnormal; Notable for the following:    Sodium 133 (*)    Potassium 3.2 (*)    CO2 10 (*)    Glucose, Bld 133 (*)    Calcium 7.1 (*)    All other components within normal  limits  BASIC METABOLIC PANEL - Abnormal; Notable for the following:    Sodium 134 (*)    Potassium 3.0 (*)    CO2 13 (*)    Glucose, Bld 166 (*)    Calcium 7.1 (*)    All other components within normal limits  CBC - Abnormal; Notable for the following:    HCT 38.3 (*)    All other components within normal limits  URINE RAPID DRUG SCREEN, HOSP PERFORMED - Abnormal; Notable for the following:    Tetrahydrocannabinol POSITIVE (*)    All other components within normal limits  GLUCOSE, CAPILLARY - Abnormal; Notable for the following:    Glucose-Capillary 179 (*)  All other components within normal limits  GLUCOSE, CAPILLARY - Abnormal; Notable for the following:    Glucose-Capillary 190 (*)    All other components within normal limits  GLUCOSE, CAPILLARY - Abnormal; Notable for the following:    Glucose-Capillary 175 (*)    All other components within normal limits  GLUCOSE, CAPILLARY - Abnormal; Notable for the following:    Glucose-Capillary 168 (*)    All other components within normal limits  GLUCOSE, CAPILLARY - Abnormal; Notable for the following:    Glucose-Capillary 147 (*)    All other components within normal limits  GLUCOSE, CAPILLARY - Abnormal; Notable for the following:    Glucose-Capillary 178 (*)    All other components within normal limits  GLUCOSE, CAPILLARY - Abnormal; Notable for the following:    Glucose-Capillary 128 (*)    All other components within normal limits  GLUCOSE, CAPILLARY - Abnormal; Notable for the following:    Glucose-Capillary 101 (*)    All other components within normal limits  GLUCOSE, CAPILLARY - Abnormal; Notable for the following:    Glucose-Capillary 132 (*)    All other components within normal limits  MAGNESIUM - Abnormal; Notable for the following:    Magnesium 1.5 (*)    All other components within normal limits  GLUCOSE, CAPILLARY - Abnormal; Notable for the following:    Glucose-Capillary 215 (*)    All other components  within normal limits  GLUCOSE, CAPILLARY - Abnormal; Notable for the following:    Glucose-Capillary 211 (*)    All other components within normal limits  GLUCOSE, CAPILLARY - Abnormal; Notable for the following:    Glucose-Capillary 190 (*)    All other components within normal limits  GLUCOSE, CAPILLARY - Abnormal; Notable for the following:    Glucose-Capillary 195 (*)    All other components within normal limits  GLUCOSE, CAPILLARY - Abnormal; Notable for the following:    Glucose-Capillary 176 (*)    All other components within normal limits  GLUCOSE, CAPILLARY - Abnormal; Notable for the following:    Glucose-Capillary 170 (*)    All other components within normal limits  CBG MONITORING, ED - Abnormal; Notable for the following:    Glucose-Capillary 408 (*)    All other components within normal limits  CBG MONITORING, ED - Abnormal; Notable for the following:    Glucose-Capillary 370 (*)    All other components within normal limits  I-STAT VENOUS BLOOD GAS, ED - Abnormal; Notable for the following:    pH, Ven 7.066 (*)    pCO2, Ven 29.3 (*)    Bicarbonate 8.4 (*)    Acid-base deficit 21.0 (*)    All other components within normal limits  CBG MONITORING, ED - Abnormal; Notable for the following:    Glucose-Capillary 274 (*)    All other components within normal limits  CBG MONITORING, ED - Abnormal; Notable for the following:    Glucose-Capillary 226 (*)    All other components within normal limits  CBG MONITORING, ED - Abnormal; Notable for the following:    Glucose-Capillary 190 (*)    All other components within normal limits  MRSA PCR SCREENING  TROPONIN I  LACTIC ACID, PLASMA  HEMOGLOBIN Y8M  BASIC METABOLIC PANEL  BASIC METABOLIC PANEL  BASIC METABOLIC PANEL  BASIC METABOLIC PANEL    Imaging Review Dg Chest Port 1 View  12/01/2015  CLINICAL DATA:  Diabetic ketoacidosis. EXAM: PORTABLE CHEST 1 VIEW COMPARISON:  07/22/2015 FINDINGS: The heart size and  mediastinal contours are within normal limits. Both lungs are clear. The visualized skeletal structures are unremarkable. IMPRESSION: No active disease. Electronically Signed   By: Lucienne Capers M.D.   On: 12/01/2015 23:28   I have personally reviewed and evaluated these images and lab results as part of my medical decision-making.   EKG Interpretation None      CRITICAL CARE: DKA Performed by: Alvino Chapel   Total critical care time: 30 minutes  Critical care time was exclusive of separately billable procedures and treating other patients.  Critical care was necessary to treat or prevent imminent or life-threatening deterioration.  Critical care was time spent personally by me on the following activities: development of treatment plan with patient and/or surrogate as well as nursing, discussions with consultants, evaluation of patient's response to treatment, examination of patient, obtaining history from patient or surrogate, ordering and performing treatments and interventions, ordering and review of laboratory studies, ordering and review of radiographic studies, pulse oximetry and re-evaluation of patient's condition.  MDM   Final diagnoses:  DKA (diabetic ketoacidoses) ([place])   30yo male with history of DM presents with concern for hyperglycemia, nausea and vomiting.  Pt missed dose of insulin prior to symptoms starting.  Pt with DKA on labs, likely secondary to lack of insulin use. Pt with mild nasal congestion and viral etiology also possible.  Initiated insulin gtt. Given IV fluids.  Admitted to stepdown unit.   Gareth Morgan, MD 12/02/15 1807

## 2015-12-02 DIAGNOSIS — E876 Hypokalemia: Secondary | ICD-10-CM

## 2015-12-02 DIAGNOSIS — N179 Acute kidney failure, unspecified: Secondary | ICD-10-CM

## 2015-12-02 DIAGNOSIS — E1010 Type 1 diabetes mellitus with ketoacidosis without coma: Secondary | ICD-10-CM | POA: Diagnosis present

## 2015-12-02 DIAGNOSIS — E860 Dehydration: Secondary | ICD-10-CM

## 2015-12-02 LAB — BASIC METABOLIC PANEL
ANION GAP: 8 (ref 5–15)
ANION GAP: 13 (ref 5–15)
ANION GAP: 13 (ref 5–15)
Anion gap: 10 (ref 5–15)
Anion gap: 7 (ref 5–15)
BUN: 7 mg/dL (ref 6–20)
BUN: 11 mg/dL (ref 6–20)
BUN: 13 mg/dL (ref 6–20)
BUN: 14 mg/dL (ref 6–20)
BUN: 18 mg/dL (ref 6–20)
CALCIUM: 7.1 mg/dL — AB (ref 8.9–10.3)
CALCIUM: 7.1 mg/dL — AB (ref 8.9–10.3)
CHLORIDE: 108 mmol/L (ref 101–111)
CHLORIDE: 109 mmol/L (ref 101–111)
CO2: 19 mmol/L — AB (ref 22–32)
CO2: 10 mmol/L — AB (ref 22–32)
CO2: 11 mmol/L — AB (ref 22–32)
CO2: 13 mmol/L — AB (ref 22–32)
CO2: 14 mmol/L — AB (ref 22–32)
Calcium: 7.1 mg/dL — ABNORMAL LOW (ref 8.9–10.3)
Calcium: 7.1 mg/dL — ABNORMAL LOW (ref 8.9–10.3)
Calcium: 7.2 mg/dL — ABNORMAL LOW (ref 8.9–10.3)
Chloride: 114 mmol/L — ABNORMAL HIGH (ref 101–111)
Chloride: 113 mmol/L — ABNORMAL HIGH (ref 101–111)
Chloride: 110 mmol/L (ref 101–111)
Creatinine, Ser: 1.2 mg/dL (ref 0.61–1.24)
Creatinine, Ser: 0.95 mg/dL (ref 0.61–1.24)
Creatinine, Ser: 0.8 mg/dL (ref 0.61–1.24)
Creatinine, Ser: 0.83 mg/dL (ref 0.61–1.24)
Creatinine, Ser: 0.77 mg/dL (ref 0.61–1.24)
GFR calc Af Amer: >60 mL/min (ref >60)
GFR calc Af Amer: >60 mL/min (ref >60)
GFR calc Af Amer: >60 mL/min (ref >60)
GFR calc Af Amer: >60 mL/min (ref >60)
GFR calc Af Amer: >60 mL/min (ref >60)
GFR calc non Af Amer: >60 mL/min (ref >60)
GLUCOSE: 173 mg/dL — AB (ref 65–99)
GLUCOSE: 133 mg/dL — AB (ref 65–99)
GLUCOSE: 166 mg/dL — AB (ref 65–99)
GLUCOSE: 181 mg/dL — AB (ref 65–99)
GLUCOSE: 183 mg/dL — AB (ref 65–99)
POTASSIUM: 3.6 mmol/L (ref 3.5–5.1)
POTASSIUM: 3.2 mmol/L — AB (ref 3.5–5.1)
POTASSIUM: 3 mmol/L — AB (ref 3.5–5.1)
POTASSIUM: 2.5 mmol/L — AB (ref 3.5–5.1)
Potassium: 4.1 mmol/L (ref 3.5–5.1)
Sodium: 135 mmol/L (ref 135–145)
Sodium: 135 mmol/L (ref 135–145)
Sodium: 133 mmol/L — ABNORMAL LOW (ref 135–145)
Sodium: 134 mmol/L — ABNORMAL LOW (ref 135–145)
Sodium: 135 mmol/L (ref 135–145)

## 2015-12-02 LAB — MAGNESIUM
MAGNESIUM: 1.5 mg/dL — AB (ref 1.7–2.4)
Magnesium: 2.9 mg/dL — ABNORMAL HIGH (ref 1.7–2.4)

## 2015-12-02 LAB — GLUCOSE, CAPILLARY
GLUCOSE-CAPILLARY: 179 mg/dL — AB (ref 65–99)
GLUCOSE-CAPILLARY: 190 mg/dL — AB (ref 65–99)
GLUCOSE-CAPILLARY: 175 mg/dL — AB (ref 65–99)
GLUCOSE-CAPILLARY: 101 mg/dL — AB (ref 65–99)
GLUCOSE-CAPILLARY: 190 mg/dL — AB (ref 65–99)
GLUCOSE-CAPILLARY: 195 mg/dL — AB (ref 65–99)
GLUCOSE-CAPILLARY: 176 mg/dL — AB (ref 65–99)
GLUCOSE-CAPILLARY: 104 mg/dL — AB (ref 65–99)
GLUCOSE-CAPILLARY: 192 mg/dL — AB (ref 65–99)
Glucose-Capillary: 168 mg/dL — ABNORMAL HIGH (ref 65–99)
Glucose-Capillary: 147 mg/dL — ABNORMAL HIGH (ref 65–99)
Glucose-Capillary: 178 mg/dL — ABNORMAL HIGH (ref 65–99)
Glucose-Capillary: 128 mg/dL — ABNORMAL HIGH (ref 65–99)
Glucose-Capillary: 132 mg/dL — ABNORMAL HIGH (ref 65–99)
Glucose-Capillary: 215 mg/dL — ABNORMAL HIGH (ref 65–99)
Glucose-Capillary: 211 mg/dL — ABNORMAL HIGH (ref 65–99)
Glucose-Capillary: 170 mg/dL — ABNORMAL HIGH (ref 65–99)
Glucose-Capillary: 156 mg/dL — ABNORMAL HIGH (ref 65–99)
Glucose-Capillary: 75 mg/dL (ref 65–99)
Glucose-Capillary: 101 mg/dL — ABNORMAL HIGH (ref 65–99)
Glucose-Capillary: 137 mg/dL — ABNORMAL HIGH (ref 65–99)

## 2015-12-02 LAB — HEMOGLOBIN A1C
HEMOGLOBIN A1C: 13.9 % — AB (ref 4.8–5.6)
Mean Plasma Glucose: 352 mg/dL

## 2015-12-02 LAB — CBC
HEMATOCRIT: 38.3 % — AB (ref 39.0–52.0)
HEMOGLOBIN: 13.2 g/dL (ref 13.0–17.0)
MCH: 30.3 pg (ref 26.0–34.0)
MCHC: 34.5 g/dL (ref 30.0–36.0)
MCV: 88 fL (ref 78.0–100.0)
Platelets: 255 10*3/uL (ref 150–400)
RBC: 4.35 MIL/uL (ref 4.22–5.81)
RDW: 12.7 % (ref 11.5–15.5)
WBC: 8.1 10*3/uL (ref 4.0–10.5)

## 2015-12-02 LAB — RAPID URINE DRUG SCREEN, HOSP PERFORMED
Amphetamines: NOT DETECTED
BARBITURATES: NOT DETECTED
Benzodiazepines: NOT DETECTED
COCAINE: NOT DETECTED
Opiates: NOT DETECTED
TETRAHYDROCANNABINOL: POSITIVE — AB

## 2015-12-02 LAB — CBG MONITORING, ED: GLUCOSE-CAPILLARY: 190 mg/dL — AB (ref 65–99)

## 2015-12-02 LAB — LACTIC ACID, PLASMA: Lactic Acid, Venous: 0.6 mmol/L (ref 0.5–1.9)

## 2015-12-02 LAB — TROPONIN I

## 2015-12-02 LAB — MRSA PCR SCREENING: MRSA BY PCR: NEGATIVE

## 2015-12-02 MED ORDER — ACETAMINOPHEN 325 MG PO TABS: 650.0000 mg | ORAL_TABLET | Freq: Four times a day (QID) | ORAL | Status: DC | PRN

## 2015-12-02 MED ORDER — CHLORPROMAZINE HCL 10 MG PO TABS
10.0000 mg | ORAL_TABLET | Freq: Three times a day (TID) | ORAL | Status: DC | PRN
  Administered 2015-12-02: 10 mg via ORAL
  Filled 2015-12-02 (×3): qty 1

## 2015-12-02 MED ORDER — FAMOTIDINE IN NACL 20-0.9 MG/50ML-% IV SOLN
20.0000 mg | Freq: Two times a day (BID) | INTRAVENOUS | Status: DC
  Administered 2015-12-02 – 2015-12-03 (×3): 20 mg via INTRAVENOUS
  Filled 2015-12-02 (×3): qty 50

## 2015-12-02 MED ORDER — ACETAMINOPHEN 325 MG PO TABS: 650.0000 mg | ORAL_TABLET | Freq: Once | ORAL | Status: DC

## 2015-12-02 MED ORDER — POTASSIUM CHLORIDE 10 MEQ/100ML IV SOLN
10.0000 meq | INTRAVENOUS | Status: AC
Start: 2015-12-02 — End: 2015-12-02
  Administered 2015-12-02 (×4): 10 meq via INTRAVENOUS
  Filled 2015-12-02 (×4): qty 100

## 2015-12-02 MED ORDER — SODIUM CHLORIDE 0.9 % IV BOLUS (SEPSIS)
500.0000 mL | Freq: Once | INTRAVENOUS | Status: AC
Start: 2015-12-02 — End: 2015-12-02
  Administered 2015-12-02: 500 mL via INTRAVENOUS

## 2015-12-02 MED ORDER — PROMETHAZINE HCL 25 MG/ML IJ SOLN
12.5000 mg | Freq: Once | INTRAMUSCULAR | Status: AC
  Administered 2015-12-02: 12.5 mg via INTRAVENOUS
  Filled 2015-12-02: qty 1

## 2015-12-02 MED ORDER — ONDANSETRON HCL 4 MG/2ML IJ SOLN
4.0000 mg | INTRAMUSCULAR | Status: DC | PRN
  Administered 2015-12-02 (×2): 4 mg via INTRAVENOUS
  Filled 2015-12-02 (×2): qty 2

## 2015-12-02 MED ORDER — MAGNESIUM SULFATE 2 GM/50ML IV SOLN
2.0000 g | Freq: Once | INTRAVENOUS | Status: AC
  Administered 2015-12-02: 2 g via INTRAVENOUS
  Filled 2015-12-02: qty 50

## 2015-12-02 MED ORDER — PROMETHAZINE HCL 25 MG/ML IJ SOLN: 12.5000 mg | Freq: Three times a day (TID) | INTRAMUSCULAR | Status: DC | PRN

## 2015-12-02 MED ORDER — POTASSIUM CHLORIDE 10 MEQ/100ML IV SOLN
10.0000 meq | INTRAVENOUS | Status: AC
  Administered 2015-12-02 – 2015-12-03 (×6): 10 meq via INTRAVENOUS
  Filled 2015-12-02 (×6): qty 100

## 2015-12-02 MED ORDER — POTASSIUM CHLORIDE CRYS ER 20 MEQ PO TBCR
40.0000 meq | EXTENDED_RELEASE_TABLET | Freq: Once | ORAL | Status: AC
  Administered 2015-12-02: 40 meq via ORAL
  Filled 2015-12-02: qty 2

## 2015-12-02 NOTE — Care Management (Addendum)
Confirmed with [facility] that he is  active with [facility] , therefore can have prescriptions fill there . [name] .   Spoke with patient confirmed he is active with [facility] and had no problems filling prescriptions there.   [name] RN BSN [phone]

## 2015-12-02 NOTE — Progress Notes (Signed)
PROGRESS NOTE  [name] [name]  GNF:[id number] DOB: 01/12/1986  DOA: 12/01/2015 PCP: [name], MD   Brief Narrative:  30 year old male with history of poorly controlled DM 1, prior renal insufficiency for which she was temporarily on HD in 2011 but no longer on dialysis, claims compliance with diet and medications, presented to ED on 12/01/15 with complaints of elevated blood sugars >400, missing a dose of Lantus and 2 episodes of nonbloody emesis, nausea without abdominal pain or diarrhea. Initial labs: Sodium 131, bicarbonate <7, creatinine 1.53, glucose 403 and VBG showed pH of 7.066. Admitted to stepdown for management of DKA. Improving.   Assessment & Plan:   Principal Problem:   Diabetic ketoacidosis without coma associated with type 1 diabetes mellitus (HCC) Active Problems:   Acute renal failure (HCC)   DKA (diabetic ketoacidoses) (HCC)   DKA in type I DM not at goal - States that he was diagnosed with diabetes approximately 4 years ago. - Last A1c on 06/15/15:15.1 and has not had A1c's in the system less than 11 ever. - Claims compliance to home dose of insulin's, Lantus 40 units at bedtime and NovoLog 10 units 3 times a day with meals. States that the doses have not been changed for the last 6-7 minutes. - Claims to check CBGs at home up to 3-4 times daily and usually has readings of >250 but for the last 2 days noted >400. - Treated for DKA with aggressive IV fluid hydration, IV insulin drip, potassium supplementation as needed and close monitoring of BMP. - Clinically improving. CBGs are better controlled. Anion gap has normalized but patient still has bicarbonate low at 10. Provided additional normal saline bolus 500 mL 1 and increased IV fluids to 200 ML per hour. Follow BMP in a couple of hours and if bicarbonate remains persistently low, may consider bicarbonate drip for non-anion gap metabolic acidosis. - Check A1c. Diabetes coordinator consulted. Will need adjustment  of insulin doses prior to discharge. - Suspect issues with compliance.  Dehydration - Secondary to hyperglycemia, polyuria and GI losses. Continue IV fluids.  Acute kidney injury - Resolved.  Hypokalemia - Replace and follow.  Nausea and vomiting - DD: Secondary to DKA, acute viral GE, diabetic gastroparesis. Treat supportively and follow closely. Add IV Pepcid.  Headache - Mild and intermittent. States that this is better. Indicates that he had taken Aleve for 2 days. Denies aspirin use. Denies tobacco or alcohol use.  Elevated blood pressures - May be stress related versus hypertension. Monitor for now. May need to start ACEI at some point for proteinuria and elevated blood pressures.   DVT prophylaxis: Lovenox Code Status: Full Family Communication: Discussed with patient. No family at bedside. Disposition Plan: Admitted to stepdown unit. Continue management in stepdown unit for additional 24 hours. DC home when medically stable, possibly in the next 48 hours.   Consultants:   None  Procedures:   None  Antimicrobials:   None    Subjective: Feels better. Intermittent nausea but no vomiting since approximately 2 AM. No abdominal pain or diarrhea or fevers. Headache improved. As per RN, no acute issues.  Objective:  Filed Vitals:   12/02/15 0015 12/02/15 0052 12/02/15 0330 12/02/15 0729  BP: 154/103 137/91 138/86 131/86  Pulse: 97  93 84  Temp:  98.1 F (36.7 C) 98.6 F (37 C) 98 F (36.7 C)  TempSrc:  Oral Oral Oral  Resp: 16  12 14   Height:      Weight:  SpO2: 100% 100% 100% 100%    Intake/Output Summary (Last 24 hours) at 12/02/15 1036 Last data filed at 12/02/15 1610  Gross per 24 hour  Intake   2075 ml  Output   1150 ml  Net    925 ml   Filed Weights   12/01/15 1842  Weight: 51.256 kg (113 lb)    Examination:  General exam: Pleasant young male lying comfortably supine in bed. Does not look septic or toxic. Oral mucosa  dry. Respiratory system: Clear to auscultation. Respiratory effort normal. Cardiovascular system: S1 & S2 heard, RRR. No JVD, murmurs, rubs, gallops or clicks. No pedal edema. Telemetry: Sinus rhythm. Gastrointestinal system: Abdomen is nondistended, soft and nontender. No organomegaly or masses felt. Normal bowel sounds heard. Central nervous system: Alert and oriented. No focal neurological deficits. Extremities: Symmetric 5 x 5 power. Skin: No rashes, lesions or ulcers Psychiatry: Judgement and insight appear normal. Mood & affect appropriate.     Data Reviewed: I have personally reviewed following labs and imaging studies  CBC:  Recent Labs Lab 12/01/15 1904 12/02/15 0030  WBC 10.8* 8.1  HGB 16.8 13.2  HCT 48.4 38.3*  MCV 90.5 88.0  PLT 286 255   Basic Metabolic Panel:  Recent Labs Lab 12/01/15 1904 12/02/15 0030 12/02/15 0322 12/02/15 0713  NA 131* 135 135 133*  K 4.2 4.1 3.6 3.2*  CL 102 114* 113* 110  CO2 <7* 11* 14* 10*  GLUCOSE 403* 183* 181* 133*  BUN 23* CREATININE 1.53* 1.20 0.95 0.80  CALCIUM 9.1 7.1* 7.1* 7.1*   GFR: Estimated Creatinine Clearance: 96.2 mL/min (by C-G formula based on Cr of 0.8). Liver Function Tests: No results for input(s): AST, ALT, ALKPHOS, BILITOT, PROT, ALBUMIN in the last 168 hours. No results for input(s): LIPASE, AMYLASE in the last 168 hours. No results for input(s): AMMONIA in the last 168 hours. Coagulation Profile: No results for input(s): INR, PROTIME in the last 168 hours. Cardiac Enzymes:  Recent Labs Lab 12/02/15 0030  TROPONINI <0.03   BNP (last 3 results) No results for input(s): PROBNP in the last 8760 hours. HbA1C: No results for input(s): HGBA1C in the last 72 hours. CBG:  Recent Labs Lab 12/02/15 0549 12/02/15 0638 12/02/15 0753 12/02/15 0921 12/02/15 1026  GLUCAP 147* 178* 128* 101* 132*   Lipid Profile: No results for input(s): CHOL, HDL, LDLCALC, TRIG, CHOLHDL, LDLDIRECT in the  last 72 hours. Thyroid Function Tests: No results for input(s): TSH, T4TOTAL, FREET4, T3FREE, THYROIDAB in the last 72 hours. Anemia Panel: No results for input(s): VITAMINB12, FOLATE, FERRITIN, TIBC, IRON, RETICCTPCT in the last 72 hours.  Sepsis Labs: No results for input(s): PROCALCITON, LATICACIDVEN in the last 168 hours.  Recent Results (from the past 240 hour(s))  MRSA PCR Screening     Status: None   Collection Time: 12/02/15  6:04 AM  Result Value Ref Range Status   MRSA by PCR NEGATIVE NEGATIVE Final    Comment:        The GeneXpert MRSA Assay (FDA approved for NASAL specimens only), is one component of a comprehensive MRSA colonization surveillance program. It is not intended to diagnose MRSA infection nor to guide or monitor treatment for MRSA infections.          Radiology Studies: Dg Chest Port 1 View  12/01/2015  CLINICAL DATA:  Diabetic ketoacidosis. EXAM: PORTABLE CHEST 1 VIEW COMPARISON:  07/22/2015 FINDINGS: The heart size and mediastinal contours are within normal limits. Both  lungs are clear. The visualized skeletal structures are unremarkable. IMPRESSION: No active disease. Electronically Signed   By: Burman NievesWilliam  Stevens M.D.   On: 12/01/2015 23:28        Scheduled Meds: . enoxaparin (LOVENOX) injection  40 mg Subcutaneous QHS  . famotidine (PEPCID) IV  20 mg Intravenous Q12H  . potassium chloride  10 mEq Intravenous Q1 Hr x 4   Continuous Infusions: . sodium chloride    . dextrose 5 % and 0.45% NaCl 150 mL/hr at 12/02/15 0746  . insulin (NOVOLIN-R) infusion 3.9 Units/hr (12/02/15 0030)        Time spent: 45 minutes.    Moundview Mem Hsptl And ClinicsNGALGI,ANAND, MD [facility] Pager [phone]  If 7PM-7AM, please contact night-coverage [email or web address] Password [email or web address] 12/02/2015, 10:36 AM

## 2015-12-02 NOTE — ED Notes (Signed)
Attempted to call report

## 2015-12-02 NOTE — Progress Notes (Signed)
Received diabetes coordinator consult.  Spoke with patient briefly.  Patient answered all my questions. He was diagnosed with DM 4 years ago.  States that he takes Lantus 40 units every HS and Novolog 10 units TID. States that he has been taking his insulin everyday.  Checks blood sugars four times per day. States that his HgbA1C is 12%. Reminded him that the A1C result is high and should be 7% or less.  Has seen Dr. Dessa PhiJosalyn Funches just 2 months ago. Ordered a consult for care management to see patient about insurance and being able to get insulin. Will continue to monitor blood sugars while in the hospital.  Smith MinceKendra Teighlor Korson RN BSN CDE

## 2015-12-03 ENCOUNTER — Encounter: Payer: Self-pay | Admitting: Physician Assistant

## 2015-12-03 LAB — BASIC METABOLIC PANEL
ANION GAP: 3 — AB (ref 5–15)
ANION GAP: 4 — AB (ref 5–15)
Anion gap: 8 (ref 5–15)
BUN: 6 mg/dL (ref 6–20)
BUN: <5 mg/dL — ABNORMAL LOW (ref 6–20)
CHLORIDE: 113 mmol/L — AB (ref 101–111)
CHLORIDE: 110 mmol/L (ref 101–111)
CHLORIDE: 110 mmol/L (ref 101–111)
CO2: 21 mmol/L — ABNORMAL LOW (ref 22–32)
CO2: 23 mmol/L (ref 22–32)
CO2: 19 mmol/L — AB (ref 22–32)
Calcium: 7.3 mg/dL — ABNORMAL LOW (ref 8.9–10.3)
Calcium: 7.6 mg/dL — ABNORMAL LOW (ref 8.9–10.3)
Calcium: 7.6 mg/dL — ABNORMAL LOW (ref 8.9–10.3)
Creatinine, Ser: 0.69 mg/dL (ref 0.61–1.24)
Creatinine, Ser: 0.71 mg/dL (ref 0.61–1.24)
Creatinine, Ser: 0.75 mg/dL (ref 0.61–1.24)
GFR calc Af Amer: >60 mL/min (ref >60)
GFR calc Af Amer: >60 mL/min (ref >60)
GFR calc non Af Amer: >60 mL/min (ref >60)
GFR calc non Af Amer: >60 mL/min (ref >60)
GLUCOSE: 84 mg/dL (ref 65–99)
Glucose, Bld: 191 mg/dL — ABNORMAL HIGH (ref 65–99)
Glucose, Bld: 98 mg/dL (ref 65–99)
POTASSIUM: 3.4 mmol/L — AB (ref 3.5–5.1)
POTASSIUM: 2.7 mmol/L — AB (ref 3.5–5.1)
POTASSIUM: 3.3 mmol/L — AB (ref 3.5–5.1)
SODIUM: 139 mmol/L (ref 135–145)
SODIUM: 135 mmol/L (ref 135–145)
Sodium: 137 mmol/L (ref 135–145)

## 2015-12-03 LAB — GLUCOSE, CAPILLARY
GLUCOSE-CAPILLARY: 101 mg/dL — AB (ref 65–99)
GLUCOSE-CAPILLARY: 108 mg/dL — AB (ref 65–99)
GLUCOSE-CAPILLARY: 146 mg/dL — AB (ref 65–99)
GLUCOSE-CAPILLARY: 195 mg/dL — AB (ref 65–99)
GLUCOSE-CAPILLARY: 70 mg/dL (ref 65–99)
Glucose-Capillary: 163 mg/dL — ABNORMAL HIGH (ref 65–99)
Glucose-Capillary: 144 mg/dL — ABNORMAL HIGH (ref 65–99)

## 2015-12-03 LAB — TROPONIN I

## 2015-12-03 MED ORDER — INSULIN GLARGINE 100 UNIT/ML SOLOSTAR PEN: 45.0000 [IU] | PEN_INJECTOR | Freq: Every day | SUBCUTANEOUS | Status: DC

## 2015-12-03 MED ORDER — INSULIN ASPART 100 UNIT/ML ~~LOC~~ SOLN: 0.0000 [IU] | Freq: Every day | SUBCUTANEOUS | Status: DC

## 2015-12-03 MED ORDER — PANTOPRAZOLE SODIUM 40 MG PO TBEC: 40.0000 mg | DELAYED_RELEASE_TABLET | Freq: Every day | ORAL | Status: DC

## 2015-12-03 MED ORDER — INSULIN ASPART 100 UNIT/ML ~~LOC~~ SOLN: 0.0000 [IU] | Freq: Three times a day (TID) | SUBCUTANEOUS | Status: DC

## 2015-12-03 MED ORDER — ACETAMINOPHEN 325 MG PO TABS
650.0000 mg | ORAL_TABLET | Freq: Four times a day (QID) | ORAL | Status: DC | PRN
Start: 2015-12-03 — End: 2016-02-07

## 2015-12-03 MED ORDER — POTASSIUM CHLORIDE CRYS ER 20 MEQ PO TBCR
40.0000 meq | EXTENDED_RELEASE_TABLET | Freq: Once | ORAL | Status: AC
  Administered 2015-12-03: 40 meq via ORAL
  Filled 2015-12-03: qty 2

## 2015-12-03 MED ORDER — ALUM & MAG HYDROXIDE-SIMETH 200-200-20 MG/5ML PO SUSP
30.0000 mL | ORAL | Status: DC | PRN
Start: 2015-12-03 — End: 2015-12-03
  Administered 2015-12-03 (×2): 30 mL via ORAL
  Filled 2015-12-03 (×2): qty 30

## 2015-12-03 MED ORDER — INSULIN GLARGINE 100 UNIT/ML ~~LOC~~ SOLN
45.0000 [IU] | Freq: Every day | SUBCUTANEOUS | Status: DC
  Administered 2015-12-03: 45 [IU] via SUBCUTANEOUS
  Filled 2015-12-03 (×2): qty 0.45

## 2015-12-03 MED ORDER — INSULIN ASPART 100 UNIT/ML ~~LOC~~ SOLN
10.0000 [IU] | Freq: Three times a day (TID) | SUBCUTANEOUS | Status: DC
  Administered 2015-12-03 (×2): 10 [IU] via SUBCUTANEOUS

## 2015-12-03 MED ORDER — NITROGLYCERIN 0.4 MG SL SUBL: 0.4000 mg | SUBLINGUAL_TABLET | SUBLINGUAL | Status: DC | PRN

## 2015-12-03 MED ORDER — ASPIRIN 81 MG PO CHEW
324.0000 mg | CHEWABLE_TABLET | Freq: Once | ORAL | Status: AC
  Administered 2015-12-03: 324 mg via ORAL
  Filled 2015-12-03: qty 4

## 2015-12-03 MED ORDER — ALUM & MAG HYDROXIDE-SIMETH 200-200-20 MG/5ML PO SUSP: 30.0000 mL | Freq: Four times a day (QID) | ORAL | Status: DC | PRN

## 2015-12-03 NOTE — Progress Notes (Signed)
Patient complaining of new onset chest pain rated 7/10.  Pt. States "it feels like pressure," and that "I have never felt this before."  Pt. VSS with no s/s of distress noted.  Physician paged and currently at bedside.  Will continue to monitor.

## 2015-12-03 NOTE — Discharge Summary (Signed)
Physician Discharge Summary  [name] [name] SWN:[id number] DOB: 1986-04-03  PCP: Minerva Ends, MD  Admit date: 12/01/2015 Discharge date: 12/03/2015  Admitted From: Home  Disposition:  Home  Recommendations for Outpatient Follow-up:  1. Dr. [name], PCPIn 5 days with repeat labs (BMP).  Home Health: None Equipment/Devices: None    Discharge Condition: Improved and stable.  CODE STATUS: Full  Diet recommendation: Heart healthy and diabetic diet.  Discharge Diagnoses:  Principal Problem:   Diabetic ketoacidosis without coma associated with type 1 diabetes mellitus ([place]) Active Problems:   Acute renal failure (HCC)   DKA (diabetic ketoacidoses) (HCC)   DKA, type 1, not at goal [facility])   Brief/Interim Summary: 30 year old male with history of poorly controlled DM 1, prior renal insufficiency for which she was temporarily on HD in 2011 but no longer on dialysis, claims compliance with diet and medications, presented to ED on 12/01/15 with complaints of elevated blood sugars >400, missing a dose of Lantus and 2 episodes of nonbloody emesis, nausea without abdominal pain or diarrhea. Initial labs: Sodium 131, bicarbonate <7, creatinine 1.53, glucose 403 and VBG showed pH of 7.066. Admitted to stepdown for management of DKA.   Assessment & Plan:  Principal Problem:  Diabetic ketoacidosis without coma associated with type 1 diabetes mellitus ([place]) Active Problems:  Acute renal failure ([place])  DKA (diabetic ketoacidoses) ([place])   DKA in type I DM not at goal - States that he was diagnosed with diabetes approximately 4 years ago. - Last A1c on 06/15/15:15.1 and has not had A1c's in the system less than 11 ever. - Claims compliance to home dose of insulin's, Lantus 40 units at bedtime and NovoLog 10 units 3 times a day with meals. States that the doses have not been changed for the last 6-7 minutes. - Claims to check CBGs at home up to 3-4 times daily and usually has  readings of >250 but for the last 2 days noted >400. - Treated for DKA with aggressive IV fluid hydration, IV insulin drip, potassium supplementation as needed and close monitoring of BMP. - Check A1c: 13.9. Diabetes coordinator consulted.  - Suspect issues with compliance. - After his DKA had resolved and bicarbonate had normalized, he was transitioned to Lantus 45 units at bedtime and his prior home dose of NovoLog. Patient has been counseled extensively regarding compliance with all aspects of diabetes care and he verbalizes understanding.  Dehydration - Secondary to hyperglycemia, polyuria and GI losses. Resolved after IV fluids.  Acute kidney injury - Resolved after IV fluids.  Hypokalemia - Replaced aggressively. Follow BMP next week during PCP follow-up.  Nausea and vomiting - DD: Secondary to DKA, acute viral GE, diabetic gastroparesis. Treated supportively - Clinically improved. Complained of some epigastric/lower substernal area discomfort this morning with gaseous distention which improved with Maalox. No emesis reported today. Patient is also on [facility] at [facility] have an element of gastritis-discontinued NSAIDs. - DC home on oral PPI, Maalox when necessary. Advised that if his symptoms recur or worsen, seek immediate medical attention and he verbalizes understanding.  Headache - Mild and intermittent. Indicates that he had taken Aleve for 2 days. Denies aspirin use. Denies tobacco or alcohol use. - Resolved today.  Elevated blood pressures/possible hypertension - May be stress related versus hypertension. Monitor for now. Recommend reassessing during close outpatient follow-up with PCP and consider starting ACEI/ARB for hypertension and proteinuria.  Atypical chest pain - As described above. Seems GI in etiology from nausea, vomiting and hiccups  that he had yesterday versus   acute GE. EKG normal sinus rhythm without acute findings. Troponin 2 negative. Significantly  improved after Maalox. Discussed with cardiology team and they will contact him with outpatient appointment for cardiology consultation and further evaluation if needed.  THC abuse - UDS positive for THC consistently. Cessation counseled. Patient denies tobacco or alcohol use.    Consultants:   None  Procedures:   None   Discharge Instructions      Discharge Instructions    Activity as tolerated - No restrictions    Complete by:  As directed      Call MD for:  extreme fatigue    Complete by:  As directed      Call MD for:  persistant dizziness or light-headedness    Complete by:  As directed      Call MD for:  persistant nausea and vomiting    Complete by:  As directed      Call MD for:  severe uncontrolled pain    Complete by:  As directed      Diet - low sodium heart healthy    Complete by:  As directed      Diet Carb Modified    Complete by:  As directed             Medication List    STOP taking these medications        ALEVE 220 MG tablet  Generic drug:  naproxen sodium      TAKE these medications        acetaminophen 325 MG tablet  Commonly known as:  TYLENOL  Take 2 tablets (650 mg total) by mouth every 6 (six) hours as needed for mild pain, moderate pain, fever or headache.     alum & mag hydroxide-simeth 200-200-20 MG/5ML suspension  Commonly known as:  MAALOX/MYLANTA  Take 30 mLs by mouth every 6 (six) hours as needed for indigestion or heartburn.     glucose blood test strip  Commonly known as:  TRUE METRIX BLOOD GLUCOSE TEST  1 each by Other route 3 (three) times daily.     insulin aspart 100 UNIT/ML FlexPen  Commonly known as:  NOVOLOG  Inject 10 Units into the skin 3 (three) times daily with meals.     Insulin Glargine 100 UNIT/ML Solostar Pen  Commonly known as:  LANTUS  Inject 45 Units into the skin daily at 10 pm.     Insulin Syringe-Needle U-100 31G X 5/16" 0.5 ML Misc  Commonly known as:  B-D INS SYRINGE 0.5CC/31GX5/16  1 each  by Does not apply route 2 (two) times daily.     pantoprazole 40 MG tablet  Commonly known as:  PROTONIX  Take 1 tablet (40 mg total) by mouth daily.     TRUE METRIX METER w/Device Kit  1 each by Does not apply route as needed.     TRUEPLUS LANCETS 28G Misc  1 each by Does not apply route 3 (three) times daily.       Follow-up Information    Follow up with [facility].   Specialty:  Cardiology   Why:  office will contact you for an appointment   Contact information:   [address] [place] [phone]      Follow up with Minerva Ends, MD. Schedule an appointment as soon as possible for a visit in 5 days.   Specialty:  Family Medicine  Why:  To be seen with repeat labs (BMP).   Contact information:   [place] [id number] [phone]      No Known Allergies    Procedures/Studies: Dg Chest Port 1 View  12/01/2015  CLINICAL DATA:  Diabetic ketoacidosis. EXAM: PORTABLE CHEST 1 VIEW COMPARISON:  07/22/2015 FINDINGS: The heart size and mediastinal contours are within normal limits. Both lungs are clear. The visualized skeletal structures are unremarkable. IMPRESSION: No active disease. Electronically Signed   By: Lucienne Capers M.D.   On: 12/01/2015 23:28      Subjective: Patient complained of lower substernal/epigastric discomfort after waking up this morning and stated that "it felt funny", rated at 7/10, nonradiating, no change with deep inspiration, no shortness of breath or sweating. On follow-up, improved after Maalox.  Discharge Exam:  Filed Vitals:   12/03/15 0800 12/03/15 1100 12/03/15 1500 12/03/15 1651  BP: 157/98 140/97 151/107 147/90  Pulse:  85 70   Temp: 97.8 F (36.6 C) 98.6 F (37 C) 98.2 F (36.8 C)   TempSrc: Oral Oral Oral   Resp:  11 10   Height:      Weight:      SpO2:  100% 100%     General exam: Pleasant young male lying comfortably supine in bed.  Does not look septic or toxic. Oral mucosa moist. Respiratory system: Clear to auscultation. Respiratory effort normal. Cardiovascular system: S1 & S2 heard, RRR. No JVD, murmurs, rubs, gallops or clicks. No pedal edema. Telemetry: Sinus rhythm. Gastrointestinal system: Abdomen is nondistended, soft and nontender. No organomegaly or masses felt. Normal bowel sounds heard. Central nervous system: Alert and oriented. No focal neurological deficits. Extremities: Symmetric 5 x 5 power. Skin: No rashes, lesions or ulcers Psychiatry: Judgement and insight appear normal. Mood & affect appropriate.    The results of significant diagnostics from this hospitalization (including imaging, microbiology, ancillary and laboratory) are listed below for reference.     Microbiology: Recent Results (from the past 240 hour(s))  MRSA PCR Screening     Status: None   Collection Time: 12/02/15  6:04 AM  Result Value Ref Range Status   MRSA by PCR NEGATIVE NEGATIVE Final    Comment:        The GeneXpert MRSA Assay (FDA approved for NASAL specimens only), is one component of a comprehensive MRSA colonization surveillance program. It is not intended to diagnose MRSA infection nor to guide or monitor treatment for MRSA infections.      Labs: BNP (last 3 results) No results for input(s): BNP in the last 8760 hours. Basic Metabolic Panel:  Recent Labs Lab 12/02/15 1049 12/02/15 1656 12/02/15 2026 12/03/15 0020 12/03/15 0710  NA 134* 135 137 135 139  K 3.0* 2.5* 2.7* 3.3* 3.4*  CL 108 109 110 110 113*  CO2 13* 19* 19* 21* 23  GLUCOSE 166* 173* 84 191* 98  BUN _0 <5* <5*  CREATININE 0.83 0.77 0.71 0.75 0.69  CALCIUM 7.1* 7.2* 7.6* 7.3* 7.6*  MG 1.5*  --  2.9*  --   --    Liver Function Tests: No results for input(s): AST, ALT, ALKPHOS, BILITOT, PROT, ALBUMIN in the last 168 hours. No results for input(s): LIPASE, AMYLASE in the last 168 hours. No results for input(s): AMMONIA in the  last 168 hours. CBC:  Recent Labs Lab 12/01/15 1904 12/02/15 0030  WBC 10.8* 8.1  HGB 16.8 13.2  HCT 48.4 38.3*  MCV 90.5 88.0  PLT  286 255   Cardiac Enzymes:  Recent Labs Lab 12/02/15 0030 12/03/15 0836 12/03/15 1425  TROPONINI <0.03 <0.03 <0.03   BNP: Invalid input(s): POCBNP CBG:  Recent Labs Lab 12/03/15 0211 12/03/15 0315 12/03/15 0757 12/03/15 1136 12/03/15 1455  GLUCAP 146* 108* 101* 70 144*   D-Dimer No results for input(s): DDIMER in the last 72 hours. Hgb A1c  Recent Labs  12/02/15 0030  HGBA1C 13.9*   Lipid Profile No results for input(s): CHOL, HDL, LDLCALC, TRIG, CHOLHDL, LDLDIRECT in the last 72 hours. Thyroid function studies No results for input(s): TSH, T4TOTAL, T3FREE, THYROIDAB in the last 72 hours.  Invalid input(s): FREET3 Anemia work up No results for input(s): VITAMINB12, FOLATE, FERRITIN, TIBC, IRON, RETICCTPCT in the last 72 hours. Urinalysis    Component Value Date/Time   COLORURINE YELLOW 12/01/2015 1908   APPEARANCEUR CLEAR 12/01/2015 1908   LABSPEC 1.031* 12/01/2015 1908   PHURINE 5.0 12/01/2015 1908   GLUCOSEU >1000* 12/01/2015 1908   HGBUR TRACE* 12/01/2015 1908   BILIRUBINUR NEGATIVE 12/01/2015 1908   BILIRUBINUR neg 08/10/2015 1211   KETONESUR >80* 12/01/2015 1908   PROTEINUR 30* 12/01/2015 1908   PROTEINUR neg 08/10/2015 1211   UROBILINOGEN 0.2 08/10/2015 1211   UROBILINOGEN 0.2 03/22/2015 0913   NITRITE NEGATIVE 12/01/2015 1908   NITRITE neg 08/10/2015 1211   LEUKOCYTESUR NEGATIVE 12/01/2015 1908   Sepsis Labs Invalid input(s): PROCALCITONIN,  WBC,  LACTICIDVEN    Time coordinating discharge: Over 30 minutes  SIGNED:  Vernell Leep, MD, FACP, [place]. [facility] Pager [phone]  If 7PM-7AM, please contact night-coverage [email or web address] Password [facility] 12/03/2015, 5:13 PM

## 2015-12-03 NOTE — Progress Notes (Signed)
Dr.Hongalgi requested new pt appt for this patient upon dc. Corine ShelterLuke Kilroy sent msg to schedulers to arrange who will call pt. Dayna Dunn PA-C

## 2015-12-03 NOTE — Discharge Instructions (Signed)
Diabetic Ketoacidosis °Diabetic ketoacidosis is a life-threatening complication of diabetes. If it is not treated, it can cause severe dehydration and organ damage and can lead to a coma or death. °CAUSES °This condition develops when there is not enough of the hormone insulin in the body. Insulin helps the body to break down sugar for energy. Without insulin, the body cannot break down sugar, so it breaks down fats instead. This leads to the production of acids that are called ketones. Ketones are poisonous at high levels. °This condition can be triggered by: °· Stress on the body that is brought on by an illness. °· Medicines that raise blood glucose levels. °· Not taking diabetes medicine. °SYMPTOMS °Symptoms of this condition include: °· Fatigue. °· Weight loss. °· Excessive thirst. °· Light-headedness. °· Fruity or sweet-smelling breath. °· Excessive urination. °· Vision changes. °· Confusion or irritability. °· Nausea. °· Vomiting. °· Rapid breathing. °· Abdominal pain. °· Feeling flushed. °DIAGNOSIS °This condition is diagnosed based on a medical history, a physical exam, and blood tests. You may also have a urine test that checks for ketones. °TREATMENT °This condition may be treated with: °· Fluid replacement. This may be done to correct dehydration. °· Insulin injections. These may be given through the skin or through an IV tube. °· Electrolyte replacement. Electrolytes, such as potassium and sodium, may be given in pill form or through an IV tube. °· Antibiotic medicines. These may be prescribed if your condition was caused by an infection. °HOME CARE INSTRUCTIONS °Eating and Drinking °· Drink enough fluids to keep your urine clear or pale yellow. °· If you cannot eat, alternate between drinking fluids with sugar (such as juice) and salty fluids (such as broth or bouillon). °· If you can eat, follow your usual diet and drink sugar-free liquids, such as water. °Other Instructions °· Take insulin as  directed by your health care provider. Do not skip insulin injections. Do not use expired insulin. °· If your blood sugar is over 240 mg/dL, monitor your urine ketones every 4-6 hours. °· If you were prescribed an antibiotic medicine, finish all of it even if you start to feel better. °· Rest and exercise only as directed by your health care provider. °· If you get sick, call your health care provider and begin treatment quickly. Your body often needs extra insulin to fight an illness. °· Check your blood glucose levels regularly. If your blood glucose is high, drink plenty of fluids. This helps to flush out ketones. °SEEK MEDICAL CARE IF: °· Your blood glucose level is too high or too low. °· You have ketones in your urine. °· You have a fever. °· You cannot eat. °· You cannot tolerate fluids. °· You have been vomiting for more than 2 hours. °· You continue to have symptoms of this condition. °· You develop new symptoms. °SEEK IMMEDIATE MEDICAL CARE IF: °· Your blood glucose levels continue to be high (elevated). °· Your monitor reads "high" even when you are taking insulin. °· You faint. °· You have chest pain. °· You have trouble breathing. °· You have a sudden, severe headache. °· You have sudden weakness in one arm or one leg. °· You have sudden trouble speaking or swallowing. °· You have vomiting or diarrhea that gets worse after 3 hours. °· You feel severely fatigued. °· You have trouble thinking. °· You have abdominal pain. °· You are severely dehydrated. Symptoms of severe dehydration include: °¨ Extreme thirst. °¨ Dry mouth. °¨ Blue lips. °¨   Cold hands and feet.  Rapid breathing.   This information is not intended to replace advice given to you by your health care provider. Make sure you discuss any questions you have with your health care provider.   Document Released: 05/05/2000 Document Revised: 09/22/2014 Document Reviewed: 04/15/2014 Elsevier Interactive Patient Education 2016 Elsevier  Inc.    Nonspecific Chest Pain It is often hard to find the cause of chest pain. There is always a chance that your pain could be related to something serious, such as a heart attack or a blood clot in your lungs. Chest pain can also be caused by conditions that are not life-threatening. If you have chest pain, it is very important to follow up with your doctor.  HOME CARE  If you were prescribed an antibiotic medicine, finish it all even if you start to feel better.  Avoid any activities that cause chest pain.  Do not use any tobacco products, including cigarettes, chewing tobacco, or electronic cigarettes. If you need help quitting, ask your doctor.  Do not drink alcohol.  Take medicines only as told by your doctor.  Keep all follow-up visits as told by your doctor. This is important. This includes any further testing if your chest pain does not go away.  Your doctor may tell you to keep your head raised (elevated) while you sleep.  Make lifestyle changes as told by your doctor. These may include:  Getting regular exercise. Ask your doctor to suggest some activities that are safe for you.  Eating a heart-healthy diet. Your doctor or a diet specialist (dietitian) can help you to learn healthy eating options.  Maintaining a healthy weight.  Managing diabetes, if necessary.  Reducing stress. GET HELP IF:  Your chest pain does not go away, even after treatment.  You have a rash with blisters on your chest.  You have a fever. GET HELP RIGHT AWAY IF:  Your chest pain is worse.  You have an increasing cough, or you cough up blood.  You have severe belly (abdominal) pain.  You feel extremely weak.  You pass out (faint).  You have chills.  You have sudden, unexplained chest discomfort.  You have sudden, unexplained discomfort in your arms, back, neck, or jaw.  You have shortness of breath at any time.  You suddenly start to sweat, or your skin gets clammy.  You  feel nauseous.  You vomit.  You suddenly feel light-headed or dizzy.  Your heart begins to beat quickly, or it feels like it is skipping beats. These symptoms may be an emergency. Do not wait to see if the symptoms will go away. Get medical help right away. Call your local emergency services (911 in the [place] Do not drive yourself to the hospital.   This information is not intended to replace advice given to you by your health care provider. Make sure you discuss any questions you have with your health care provider.   Document Released: 10/25/2007 Document Revised: 05/29/2014 Document Reviewed: 12/12/2013 Elsevier Interactive Patient Education [employer] Elsevier Inc.

## 2015-12-03 NOTE — Progress Notes (Signed)
CRITICAL VALUE ALERT  Critical value received:  K 2.7  Date of notification:  12/03/15  Time of notification:  0003  Critical value read back:Yes.    Nurse who received alert:  Balinda QuailsJessica Miller, RN   MD notified (1st page):  Donnamarie PoagK. Kirby, NP  Time of first page:  0005  *notified provider that IV K was currently running and that there was still another run to be administered.  BMP scheduled to be rechecked within the hour.

## 2015-12-03 NOTE — Progress Notes (Signed)
Notified MD of results of midnight BMP. K - 3.3, CO2 - 21, Anion Gap 4. Orders obtained to begin transitioning away from insulin gtt.

## 2015-12-03 NOTE — Progress Notes (Signed)
Discharge instructions given to patient all questions answered at this time.  Patient is to call and make follow up appointments directed patient to do this tomorrow.  Pt. Instructed to check blood sugar prior to taking long acting insulin this evening as well as to take it after 2200, pt. Verbalized understanding.  Discussed s/s of a heart attack and importance of follow up regarding new onset of chest pain, pt. Verbalized understanding.  Pt. VSS with no s/s of distress at discharge.  Pt. Stable at discharge.

## 2015-12-06 MED FILL — !LANTUS SOLOSTAR 100UNITS/M: 100 | 28 days supply | Qty: 9 | Fill #1

## 2015-12-06 MED FILL — !NOVOLOG FLEXPEN SYRINGE 1: 100/ML | 30 days supply | Qty: 9 | Fill #2

## 2015-12-07 ENCOUNTER — Encounter: Payer: Self-pay | Admitting: Cardiology

## 2015-12-18 ENCOUNTER — Emergency Department (HOSPITAL_COMMUNITY): Payer: Self-pay

## 2015-12-18 ENCOUNTER — Encounter (HOSPITAL_COMMUNITY): Payer: Self-pay

## 2015-12-18 ENCOUNTER — Inpatient Hospital Stay (HOSPITAL_COMMUNITY)
Admission: EM | Admit: 2015-12-18 | Discharge: 2015-12-19 | DRG: 638 | Discharge status: Against Medical Advice | Payer: Self-pay | Attending: Internal Medicine | Admitting: Internal Medicine

## 2015-12-18 DIAGNOSIS — E872 Acidosis, unspecified: Secondary | ICD-10-CM

## 2015-12-18 DIAGNOSIS — R51 Headache: Secondary | ICD-10-CM

## 2015-12-18 DIAGNOSIS — R079 Chest pain, unspecified: Secondary | ICD-10-CM | POA: Diagnosis present

## 2015-12-18 DIAGNOSIS — Z794 Long term (current) use of insulin: Secondary | ICD-10-CM

## 2015-12-18 DIAGNOSIS — Z87891 Personal history of nicotine dependence: Secondary | ICD-10-CM

## 2015-12-18 DIAGNOSIS — E871 Hypo-osmolality and hyponatremia: Secondary | ICD-10-CM

## 2015-12-18 DIAGNOSIS — Z833 Family history of diabetes mellitus: Secondary | ICD-10-CM

## 2015-12-18 DIAGNOSIS — E860 Dehydration: Secondary | ICD-10-CM

## 2015-12-18 DIAGNOSIS — E1010 Type 1 diabetes mellitus with ketoacidosis without coma: Principal | ICD-10-CM

## 2015-12-18 DIAGNOSIS — N179 Acute kidney failure, unspecified: Secondary | ICD-10-CM | POA: Diagnosis present

## 2015-12-18 DIAGNOSIS — Z9114 Patient's other noncompliance with medication regimen: Secondary | ICD-10-CM

## 2015-12-18 DIAGNOSIS — R519 Headache, unspecified: Secondary | ICD-10-CM | POA: Diagnosis present

## 2015-12-18 DIAGNOSIS — E1065 Type 1 diabetes mellitus with hyperglycemia: Secondary | ICD-10-CM | POA: Diagnosis present

## 2015-12-18 DIAGNOSIS — R0789 Other chest pain: Secondary | ICD-10-CM | POA: Diagnosis present

## 2015-12-18 DIAGNOSIS — R000 Tachycardia, unspecified: Secondary | ICD-10-CM | POA: Diagnosis present

## 2015-12-18 DIAGNOSIS — IMO0002 Reserved for concepts with insufficient information to code with codable children: Secondary | ICD-10-CM | POA: Diagnosis present

## 2015-12-18 DIAGNOSIS — G44209 Tension-type headache, unspecified, not intractable: Secondary | ICD-10-CM

## 2015-12-18 LAB — BASIC METABOLIC PANEL
ANION GAP: 17 — AB (ref 5–15)
ANION GAP: 8 (ref 5–15)
ANION GAP: 7 (ref 5–15)
BUN: 15 mg/dL (ref 6–20)
BUN: 13 mg/dL (ref 6–20)
BUN: 13 mg/dL (ref 6–20)
CALCIUM: 8 mg/dL — AB (ref 8.9–10.3)
CALCIUM: 7.7 mg/dL — AB (ref 8.9–10.3)
CALCIUM: 7.9 mg/dL — AB (ref 8.9–10.3)
CHLORIDE: 110 mmol/L (ref 101–111)
CHLORIDE: 113 mmol/L — AB (ref 101–111)
CO2: 8 mmol/L — ABNORMAL LOW (ref 22–32)
CO2: 13 mmol/L — ABNORMAL LOW (ref 22–32)
CO2: 16 mmol/L — ABNORMAL LOW (ref 22–32)
CREATININE: 1.02 mg/dL (ref 0.61–1.24)
CREATININE: 0.81 mg/dL (ref 0.61–1.24)
CREATININE: 0.63 mg/dL (ref 0.61–1.24)
Chloride: 112 mmol/L — ABNORMAL HIGH (ref 101–111)
GFR calc non Af Amer: >60 mL/min (ref >60)
GFR calc non Af Amer: >60 mL/min (ref >60)
GFR calc non Af Amer: >60 mL/min (ref >60)
Glucose, Bld: 340 mg/dL — ABNORMAL HIGH (ref 65–99)
Glucose, Bld: 157 mg/dL — ABNORMAL HIGH (ref 65–99)
Glucose, Bld: 93 mg/dL (ref 65–99)
Potassium: 4.4 mmol/L (ref 3.5–5.1)
Potassium: 3.3 mmol/L — ABNORMAL LOW (ref 3.5–5.1)
Potassium: 3.8 mmol/L (ref 3.5–5.1)
SODIUM: 135 mmol/L (ref 135–145)
SODIUM: 134 mmol/L — AB (ref 135–145)
SODIUM: 135 mmol/L (ref 135–145)

## 2015-12-18 LAB — I-STAT CHEM 8, ED
BUN: 17 mg/dL (ref 6–20)
CHLORIDE: 105 mmol/L (ref 101–111)
CREATININE: 0.7 mg/dL (ref 0.61–1.24)
Calcium, Ion: 1.22 mmol/L (ref 1.13–1.30)
GLUCOSE: 408 mg/dL — AB (ref 65–99)
HCT: 50 % (ref 39.0–52.0)
HEMOGLOBIN: 17 g/dL (ref 13.0–17.0)
POTASSIUM: 4.5 mmol/L (ref 3.5–5.1)
Sodium: 133 mmol/L — ABNORMAL LOW (ref 135–145)
TCO2: 9 mmol/L (ref 0–100)

## 2015-12-18 LAB — CBC WITH DIFFERENTIAL/PLATELET
BASOS PCT: 0 %
Basophils Absolute: 0 10*3/uL (ref 0.0–0.1)
EOS PCT: 0 %
Eosinophils Absolute: 0 10*3/uL (ref 0.0–0.7)
HEMATOCRIT: 45.3 % (ref 39.0–52.0)
Hemoglobin: 15.7 g/dL (ref 13.0–17.0)
LYMPHS PCT: 13 %
Lymphs Abs: 1.2 10*3/uL (ref 0.7–4.0)
MCH: 31.1 pg (ref 26.0–34.0)
MCHC: 34.7 g/dL (ref 30.0–36.0)
MCV: 89.7 fL (ref 78.0–100.0)
MONO ABS: 0.3 10*3/uL (ref 0.1–1.0)
MONOS PCT: 3 %
NEUTROS ABS: 7.8 10*3/uL — AB (ref 1.7–7.7)
Neutrophils Relative %: 84 %
PLATELETS: 350 10*3/uL (ref 150–400)
RBC: 5.05 MIL/uL (ref 4.22–5.81)
RDW: 13.4 % (ref 11.5–15.5)
WBC: 9.3 10*3/uL (ref 4.0–10.5)

## 2015-12-18 LAB — GLUCOSE, CAPILLARY
GLUCOSE-CAPILLARY: 384 mg/dL — AB (ref 65–99)
GLUCOSE-CAPILLARY: 219 mg/dL — AB (ref 65–99)
GLUCOSE-CAPILLARY: 218 mg/dL — AB (ref 65–99)
GLUCOSE-CAPILLARY: 136 mg/dL — AB (ref 65–99)
GLUCOSE-CAPILLARY: 109 mg/dL — AB (ref 65–99)
GLUCOSE-CAPILLARY: 97 mg/dL (ref 65–99)
GLUCOSE-CAPILLARY: 92 mg/dL (ref 65–99)
Glucose-Capillary: 294 mg/dL — ABNORMAL HIGH (ref 65–99)
Glucose-Capillary: 200 mg/dL — ABNORMAL HIGH (ref 65–99)
Glucose-Capillary: 182 mg/dL — ABNORMAL HIGH (ref 65–99)
Glucose-Capillary: 95 mg/dL (ref 65–99)

## 2015-12-18 LAB — URINE MICROSCOPIC-ADD ON
BACTERIA UA: NONE SEEN
RBC / HPF: NONE SEEN RBC/hpf (ref 0–5)
SQUAMOUS EPITHELIAL / LPF: NONE SEEN

## 2015-12-18 LAB — URINALYSIS, ROUTINE W REFLEX MICROSCOPIC
BILIRUBIN URINE: NEGATIVE
Ketones, ur: >80 mg/dL — AB
Leukocytes, UA: NEGATIVE
NITRITE: NEGATIVE
PH: 5 (ref 5.0–8.0)
Protein, ur: 30 mg/dL — AB
Specific Gravity, Urine: 1.03 (ref 1.005–1.030)

## 2015-12-18 LAB — I-STAT CG4 LACTIC ACID, ED: LACTIC ACID, VENOUS: 1.42 mmol/L (ref 0.5–1.9)

## 2015-12-18 LAB — COMPREHENSIVE METABOLIC PANEL
ALT: 21 U/L (ref 17–63)
AST: 25 U/L (ref 15–41)
Albumin: 4.9 g/dL (ref 3.5–5.0)
Alkaline Phosphatase: 84 U/L (ref 38–126)
BILIRUBIN TOTAL: 1.9 mg/dL — AB (ref 0.3–1.2)
BUN: 17 mg/dL (ref 6–20)
CHLORIDE: 101 mmol/L (ref 101–111)
CO2: <7 mmol/L — ABNORMAL LOW (ref 22–32)
Calcium: 8.7 mg/dL — ABNORMAL LOW (ref 8.9–10.3)
Creatinine, Ser: 1.31 mg/dL — ABNORMAL HIGH (ref 0.61–1.24)
GFR calc Af Amer: >60 mL/min (ref >60)
Glucose, Bld: 448 mg/dL — ABNORMAL HIGH (ref 65–99)
POTASSIUM: 4.7 mmol/L (ref 3.5–5.1)
Sodium: 130 mmol/L — ABNORMAL LOW (ref 135–145)
TOTAL PROTEIN: 8.4 g/dL — AB (ref 6.5–8.1)

## 2015-12-18 LAB — BLOOD GAS, VENOUS
ACID-BASE DEFICIT: 22.6 mmol/L — AB (ref 0.0–2.0)
BICARBONATE: 6.7 meq/L — AB (ref 20.0–24.0)
Drawn by: 103701
O2 SAT: 64.8 %
PCO2 VEN: 22.2 mmHg — AB (ref 45.0–50.0)
PO2 VEN: 38 mmHg (ref 31.0–45.0)
Patient temperature: 98.6
TCO2: 6.3 mmol/L (ref 0–100)
pH, Ven: 7.103 — CL (ref 7.250–7.300)

## 2015-12-18 LAB — TROPONIN I: Troponin I: <0.03 ng/mL (ref <0.03)

## 2015-12-18 LAB — MAGNESIUM: MAGNESIUM: 1.7 mg/dL (ref 1.7–2.4)

## 2015-12-18 LAB — CBG MONITORING, ED
GLUCOSE-CAPILLARY: 428 mg/dL — AB (ref 65–99)
Glucose-Capillary: 438 mg/dL — ABNORMAL HIGH (ref 65–99)

## 2015-12-18 LAB — I-STAT TROPONIN, ED: TROPONIN I, POC: 0 ng/mL (ref 0.00–0.08)

## 2015-12-18 MED ORDER — ACETAMINOPHEN 325 MG PO TABS
650.0000 mg | ORAL_TABLET | Freq: Four times a day (QID) | ORAL | Status: DC | PRN
  Administered 2015-12-18: 650 mg via ORAL
  Filled 2015-12-18: qty 2

## 2015-12-18 MED ORDER — SODIUM CHLORIDE 0.9 % IV SOLN
INTRAVENOUS | Status: DC
  Administered 2015-12-18: 3.7 [IU]/h via INTRAVENOUS
  Filled 2015-12-18: qty 2.5

## 2015-12-18 MED ORDER — ONDANSETRON HCL 4 MG/2ML IJ SOLN
4.0000 mg | Freq: Three times a day (TID) | INTRAMUSCULAR | Status: DC | PRN
  Filled 2015-12-18: qty 2

## 2015-12-18 MED ORDER — SODIUM CHLORIDE 0.9 % IV SOLN: INTRAVENOUS | Status: DC

## 2015-12-18 MED ORDER — SODIUM CHLORIDE 0.9 % IV SOLN
INTRAVENOUS | Status: DC
  Filled 2015-12-18: qty 2.5

## 2015-12-18 MED ORDER — DEXTROSE-NACL 5-0.45 % IV SOLN
INTRAVENOUS | Status: DC
  Administered 2015-12-18 – 2015-12-19 (×2): via INTRAVENOUS

## 2015-12-18 MED ORDER — POTASSIUM CHLORIDE 10 MEQ/100ML IV SOLN
10.0000 meq | INTRAVENOUS | Status: AC
  Administered 2015-12-18 (×4): 10 meq via INTRAVENOUS
  Filled 2015-12-18 (×3): qty 100

## 2015-12-18 MED ORDER — POTASSIUM CHLORIDE 10 MEQ/100ML IV SOLN
INTRAVENOUS | Status: AC
  Filled 2015-12-18: qty 100

## 2015-12-18 MED ORDER — KETOROLAC TROMETHAMINE 30 MG/ML IJ SOLN
30.0000 mg | Freq: Once | INTRAMUSCULAR | Status: AC
  Administered 2015-12-18: 30 mg via INTRAVENOUS
  Filled 2015-12-18: qty 1

## 2015-12-18 MED ORDER — SODIUM CHLORIDE 0.9 % IV SOLN
25.0000 mg | INTRAVENOUS | Status: DC | PRN
  Administered 2015-12-18: 25 mg via INTRAVENOUS
  Filled 2015-12-18 (×3): qty 0.5

## 2015-12-18 MED ORDER — SODIUM CHLORIDE 0.9 % IV BOLUS (SEPSIS)
1000.0000 mL | Freq: Once | INTRAVENOUS | Status: AC
  Administered 2015-12-18: 1000 mL via INTRAVENOUS

## 2015-12-18 MED ORDER — ENOXAPARIN SODIUM 40 MG/0.4ML ~~LOC~~ SOLN
40.0000 mg | SUBCUTANEOUS | Status: DC
  Administered 2015-12-18: 40 mg via SUBCUTANEOUS
  Filled 2015-12-18: qty 0.4

## 2015-12-18 MED ORDER — SODIUM CHLORIDE 0.9 % IV SOLN: INTRAVENOUS | Status: AC

## 2015-12-18 MED ORDER — DEXTROSE-NACL 5-0.45 % IV SOLN
INTRAVENOUS | Status: DC
Start: 2015-12-18 — End: 2015-12-18

## 2015-12-18 MED ORDER — METOCLOPRAMIDE HCL 5 MG/5ML PO SOLN
10.0000 mg | Freq: Once | ORAL | Status: DC
  Filled 2015-12-18: qty 10

## 2015-12-18 MED ORDER — POTASSIUM CHLORIDE 10 MEQ/100ML IV SOLN
10.0000 meq | INTRAVENOUS | Status: AC
  Administered 2015-12-18 (×2): 10 meq via INTRAVENOUS
  Filled 2015-12-18: qty 100

## 2015-12-18 MED ORDER — METOCLOPRAMIDE HCL 5 MG/ML IJ SOLN
10.0000 mg | Freq: Once | INTRAMUSCULAR | Status: AC
  Administered 2015-12-18: 10 mg via INTRAVENOUS
  Filled 2015-12-18: qty 2

## 2015-12-18 NOTE — H&P (Addendum)
History and Physical    [name] [name] SWH:[id number] DOB: 1986/05/15 DOA: 12/18/2015  PCP: Minerva Ends, MD Patient coming from: Home  Chief Complaint: Dyspnea and headache  HPI: [name] [name] is a 30 y.o. male with medical history significant of diabetes mellitus type 1 and DKA. Patient states that he has a history of headache and shortness of breath that started about 2 days ago. He states symptoms are similar to previous episodes of DKA, however and, more severe. Since 2 days ago, his symptoms have remained unchanged. Headache is frontal and intermittent intermittent. Dyspnea is worse when he is at work moving around. He feels that his current episode of DKA could be related to him not taking his Lantus the night before last, in addition to a very carbohydrate heavy diet. Reports no coughing, vision changes, abdominal pain, dysuria. He does report history of polydipsia and polyuria. He also reports chest pain that is substernal and intermittent. Not associated with dyspnea or diaphoresis. Described as pressure-like. He decided to seek evaluation in the emergency department since his symptoms fail to improve.  ED Course: While in the ED the patient was found to have hyperglycemia to the 400s. He had associated elevation in creatinine to 1.31 with normal potassium of 4.7 and low bicarbonate at less than 7. Venous blood gas was obtained and was significant for a venous pH of 7.103, PCO2 of 22.2, and a bicarbonate of 6.7. Point-of-care troponin was negative and lactic acid was 1.42. EKG not significant for STEMI. He received 2 L of normal saline and started on insulin drip per DKA protocol.  Review of Systems: As per HPI otherwise 10 point review of systems negative.   Past Medical History:  Diagnosis Date  . Diabetes mellitus without complication ([place]) Dx 6659  . Renal insufficiency 2011   pt was on dialysis for a short time approx 2011, pt is no longer on dialysis    History reviewed. No  pertinent surgical history.   reports that he quit smoking about 2 years ago. He has never used smokeless tobacco. He reports that he does not drink alcohol or use drugs.  No Known Allergies  Family History  Problem Relation Age of Onset  . Diabetes Father   . Diabetes Maternal Grandmother   . Heart disease Neg Hx   . Hypertension Neg Hx    Unacceptable: Noncontributory, unremarkable, or negative. Acceptable: Family history reviewed and not pertinent (If you reviewed it)  Prior to Admission medications   Medication Sig Start Date End Date Taking? Authorizing Provider  insulin aspart (NOVOLOG) 100 UNIT/ML FlexPen Inject 10 Units into the skin 3 (three) times daily with meals. 06/29/15  Yes [name], MD  Insulin Glargine (LANTUS) 100 UNIT/ML Solostar Pen Inject 45 Units into the skin daily at 10 pm. 12/03/15  Yes Modena Jansky, MD  acetaminophen (TYLENOL) 325 MG tablet Take 2 tablets (650 mg total) by mouth every 6 (six) hours as needed for mild pain, moderate pain, fever or headache. Patient not taking: Reported on 12/18/2015 12/03/15   Modena Jansky, MD  alum & mag hydroxide-simeth (MAALOX/MYLANTA) 200-200-20 MG/5ML suspension Take 30 mLs by mouth every 6 (six) hours as needed for indigestion or heartburn. Patient not taking: Reported on 12/18/2015 12/03/15   Modena Jansky, MD  Blood Glucose Monitoring Suppl (TRUE METRIX METER) w/Device KIT 1 each by Does not apply route as needed. 06/28/15   Josalyn Funches, MD  glucose blood (TRUE METRIX BLOOD GLUCOSE TEST) test strip  1 each by Other route 3 (three) times daily. 06/28/15   Josalyn Funches, MD  Insulin Syringe-Needle U-100 (B-D INS SYRINGE 0.5CC/31GX5/16) 31G X 5/16" 0.5 ML MISC 1 each by Does not apply route 2 (two) times daily. 06/18/15   Nishant Dhungel, MD  pantoprazole (PROTONIX) 40 MG tablet Take 1 tablet (40 mg total) by mouth daily. Patient not taking: Reported on 12/18/2015 12/03/15   Modena Jansky, MD  TRUEPLUS LANCETS  28G MISC 1 each by Does not apply route 3 (three) times daily. 06/18/15   Nishant Dhungel, MD    Physical Exam: Vitals:   12/18/15 0934 12/18/15 1030 12/18/15 1100 12/18/15 1213  BP: 147/87 160/94 140/68 (!) 160/89  Pulse: (!) 124   (!) 112  Resp: '18 17 20 '$ (!) 25  Temp: 98.4 F (36.9 C)   98.5 F (36.9 C)  TempSrc: Oral   Oral  SpO2: 100%   100%  Weight:    48 kg (105 lb 13.1 oz)  Height:    '5\' 6"'$  (1.676 m)      Constitutional: NAD, calm, comfortable Vitals:   12/18/15 0934 12/18/15 1030 12/18/15 1100 12/18/15 1213  BP: 147/87 160/94 140/68 (!) 160/89  Pulse: (!) 124   (!) 112  Resp: '18 17 20 '$ (!) 25  Temp: 98.4 F (36.9 C)   98.5 F (36.9 C)  TempSrc: Oral   Oral  SpO2: 100%   100%  Weight:    48 kg (105 lb 13.1 oz)  Height:    '5\' 6"'$  (1.676 m)   Eyes: PERRL, lids and conjunctivae normal ENMT: Mucous membranes are moist. Posterior pharynx clear of any exudate or lesions.Normal dentition.  Neck: normal, supple, no masses, no thyromegaly Respiratory: clear to auscultation bilaterally, no wheezing, no crackles. Normal respiratory effort. No accessory muscle use.  Cardiovascular: Regular rate and rhythm, no murmurs / rubs / gallops. No extremity edema. 2+ pedal pulses. No carotid bruits.  Abdomen: no tenderness, no masses palpated. No hepatosplenomegaly. Bowel sounds positive.  Musculoskeletal: no clubbing / cyanosis. No joint deformity upper and lower extremities. Good ROM, no contractures. Normal muscle tone.  Skin: no rashes, lesions, ulcers. No induration Neurologic: CN 2-12 grossly intact. Sensation intact, DTR normal. Strength 5/5 in all 4.  Psychiatric: Normal judgment and insight. Alert and oriented x 3. Normal mood.   (Anything < 9 systems with 2 bullets each down codes to level 1) (If patient refuses exam can't bill higher level) (Make sure to document decubitus ulcers present on admission -- if possible -- and whether patient has chronic indwelling catheter at time  of admission)  Labs on Admission: I have personally reviewed following labs and imaging studies  CBC:  Recent Labs Lab 12/18/15 0957 12/18/15 1009  WBC 9.3  --   NEUTROABS 7.8*  --   HGB 15.7 17.0  HCT 45.3 50.0  MCV 89.7  --   PLT 350  --    Basic Metabolic Panel:  Recent Labs Lab 12/18/15 0957 12/18/15 1009 12/18/15 1235  NA 130* 133* 135  K 4.7 4.5 4.4  CL 101 105 110  CO2 <7*  --  8*  GLUCOSE 448* 408* 340*  BUN '17 17 15  '$ CREATININE 1.31* 0.70 1.02  CALCIUM 8.7*  --  8.0*  MG  --   --  1.7   GFR: Estimated Creatinine Clearance: 70.6 mL/min (by C-G formula based on SCr of 1.02 mg/dL). Liver Function Tests:  Recent Labs Lab 12/18/15 0957  AST 25  ALT 21  ALKPHOS 84  BILITOT 1.9*  PROT 8.4*  ALBUMIN 4.9   No results for input(s): LIPASE, AMYLASE in the last 168 hours. No results for input(s): AMMONIA in the last 168 hours. Coagulation Profile: No results for input(s): INR, PROTIME in the last 168 hours. Cardiac Enzymes:  Recent Labs Lab 12/18/15 1235  TROPONINI <0.03   BNP (last 3 results) No results for input(s): PROBNP in the last 8760 hours. HbA1C: No results for input(s): HGBA1C in the last 72 hours. CBG:  Recent Labs Lab 12/18/15 0932 12/18/15 1104 12/18/15 1215 12/18/15 1402  GLUCAP 438* 428* 384* 219*   Lipid Profile: No results for input(s): CHOL, HDL, LDLCALC, TRIG, CHOLHDL, LDLDIRECT in the last 72 hours. Thyroid Function Tests: No results for input(s): TSH, T4TOTAL, FREET4, T3FREE, THYROIDAB in the last 72 hours. Anemia Panel: No results for input(s): VITAMINB12, FOLATE, FERRITIN, TIBC, IRON, RETICCTPCT in the last 72 hours. Urine analysis:    Component Value Date/Time   COLORURINE YELLOW 12/18/2015 0940   APPEARANCEUR CLEAR 12/18/2015 0940   LABSPEC 1.030 12/18/2015 0940   PHURINE 5.0 12/18/2015 0940   GLUCOSEU >1000 (A) 12/18/2015 0940   HGBUR TRACE (A) 12/18/2015 0940   BILIRUBINUR NEGATIVE 12/18/2015 0940    BILIRUBINUR neg 08/10/2015 1211   KETONESUR >80 (A) 12/18/2015 0940   PROTEINUR 30 (A) 12/18/2015 0940   UROBILINOGEN 0.2 08/10/2015 1211   UROBILINOGEN 0.2 03/22/2015 0913   NITRITE NEGATIVE 12/18/2015 0940   LEUKOCYTESUR NEGATIVE 12/18/2015 0940   Sepsis Labs: !!!!!!!!!!!!!!!!!!!!!!!!!!!!!!!!!!!!!!!!!!!! '@LABRCNTIP'$ (procalcitonin:4,lacticidven:4) )No results found for this or any previous visit (from the past 240 hour(s)).   Radiological Exams on Admission: Dg Chest Portable 1 View  Result Date: 12/18/2015 CLINICAL DATA:  Shortness breath beginning last night. EXAM: PORTABLE CHEST 1 VIEW COMPARISON:  12/01/2015 FINDINGS: The heart size and mediastinal contours are within normal limits. Both lungs are clear. The visualized skeletal structures are unremarkable. IMPRESSION: No active disease. Electronically Signed   By: Rolm Baptise M.D.   On: 12/18/2015 10:04   EKG: Independently reviewed.  Assessment/Plan Principal Problem:   DKA (diabetic ketoacidoses) (HCC) Active Problems:   DM (diabetes mellitus), type 1, uncontrolled ([place])   Sinus tachycardia (HCC)   Acute renal failure (HCC)   Headache  (please populate well all problems here in Problem List. (For example, if patient is on BP meds at home and you resume or decide to hold them, it is a problem that needs to be her. Same for CAD, COPD, HLD and so on)  DKA Patient has previous history of DKA. This episode is likely related to noncompliance of use of Lantus in addition to poor dietary intake. -Insulin drip via DKA protocol -Continue fluid resuscitation -Nothing by mouth except for noncaloric fluids -Add potassium with fluids -Metabolic panel every 4 hours, will monitor for closure of anion gap. -will will not obtain a hemoglobin A1c, as last hemoglobin A1c was 13.9 on 12/02/2015  Diabetes mellitus, Type 1, uncontrolled Last A1c 13.9 on 12/02/2015. -Old home insulin regimen -We'll transition to modified home regimen once  anion gap has closed  Sinus tachycardia -Likely secondary to dehydration from DKA -Treat DKA and monitor vitals -Patient is on telemetry  Acute renal failure, resolved Creatinine of 1.31 on initial metabolic panel, which improved to 0.7 on repeat. Likely prerenal in nature and likely secondary to dehydration from DKA -Follow-up on repeat metabolic panel -IV hydration  Headache -Tylenol when necessary  Chest pain, resolved Does not appear secondary to ACS as  EKG was unremarkable for STEMI or ST abnormalities. Also, initial troponin negative. - cycle troponin   DVT prophylaxis: Lovenox Code Status: Full code Family Communication: No family at bedside Disposition Plan: Anticipate discharge home when stabilized Consults called: None Admission status: Inpatient; stepdown unit   Cordelia Poche MD [facility]  If 7PM-7AM, please contact night-coverage [email or web address] Password [facility]  12/18/2015, 2:34 PM

## 2015-12-18 NOTE — ED Provider Notes (Signed)
[facility] DEPT Provider Note   CSN: [id number] Arrival date & time: 12/18/15  4034  First Provider Contact:  First MD Initiated Contact with Patient 12/18/15 [phone]        History   Chief Complaint Chief Complaint  Patient presents with  . Shortness of Breath    HPI [name] [name] is a 30 y.o. male.  HPI 30 year old male with past medical history of chronic renal insufficiency and poorly controlled type 1 diabetes who presents with a several-day history of increasing blood sugars, shortness of breath, nausea. Patient states he's been compliant with his insulin, but has had some mild dietary indiscretion last several days due to working 2 jobs and stress. He states that over the last 24 hours his blood sugars have increased over 400. Accompanying this, and crit includes increased thirst as well as polyuria. He has began to feel short of breath as well as nauseous and has been unable tolerate by mouth due to this nausea. He has a history of DKA and feels similar to his previous episodes. Denies any fevers, chills or recent illnesses. Denies any cough or sputum production. Denies any chest pain. No abdominal pain, but does have some nausea and nonbloody, nonbilious emesis.  Past Medical History:  Diagnosis Date  . Diabetes mellitus without complication ([place]) Dx 9563  . Renal insufficiency 2011   pt was on dialysis for a short time approx 2011, pt is no longer on dialysis    Patient Active Problem List   Diagnosis Date Noted  . DKA, type 1, not at goal [facility]) 12/02/2015  . DKA (diabetic ketoacidoses) ([place]) 12/01/2015  . Marijuana use 07/22/2015  . Proteinuria with type 1 diabetes mellitus ([place]) 07/22/2015  . High blood pressure 07/22/2015  . Uncontrolled type 1 diabetes mellitus with ketoacidosis without coma ([place])   . Hypocalcemia   . Acute renal failure ([place])   . Hypomagnesemia   . Abnormal EKG 06/15/2015  . Tachypnea   . Tinea pedis 12/17/2014  . Adjustment disorder with  mixed anxiety and depressed mood 09/03/2014  . Protein-calorie malnutrition, severe ([place]) 07/06/2014  . DM (diabetes mellitus), type 1, uncontrolled ([place]) 05/01/2014  . HLD (hyperlipidemia) 04/26/2014  . Diabetic ketoacidosis without coma associated with type 1 diabetes mellitus ([place])   . Substance abuse   . Fatigue 03/10/2012    No past surgical history on file.     Home Medications    Prior to Admission medications   Medication Sig Start Date End Date Taking? Authorizing Provider  acetaminophen (TYLENOL) 325 MG tablet Take 2 tablets (650 mg total) by mouth every 6 (six) hours as needed for mild pain, moderate pain, fever or headache. 12/03/15   Modena Jansky, MD  alum & mag hydroxide-simeth (MAALOX/MYLANTA) 200-200-20 MG/5ML suspension Take 30 mLs by mouth every 6 (six) hours as needed for indigestion or heartburn. 12/03/15   Modena Jansky, MD  Blood Glucose Monitoring Suppl (TRUE METRIX METER) w/Device KIT 1 each by Does not apply route as needed. 06/28/15   Josalyn Funches, MD  glucose blood (TRUE METRIX BLOOD GLUCOSE TEST) test strip 1 each by Other route 3 (three) times daily. 06/28/15   Josalyn Funches, MD  insulin aspart (NOVOLOG) 100 UNIT/ML FlexPen Inject 10 Units into the skin 3 (three) times daily with meals. 06/29/15   [name], MD  Insulin Glargine (LANTUS) 100 UNIT/ML Solostar Pen Inject 45 Units into the skin daily at 10 pm. 12/03/15   Modena Jansky, MD  Insulin Syringe-Needle  U-100 (B-D INS SYRINGE 0.5CC/31GX5/16) 31G X 5/16" 0.5 ML MISC 1 each by Does not apply route 2 (two) times daily. 06/18/15   Nishant Dhungel, MD  pantoprazole (PROTONIX) 40 MG tablet Take 1 tablet (40 mg total) by mouth daily. 12/03/15   Modena Jansky, MD  TRUEPLUS LANCETS 28G MISC 1 each by Does not apply route 3 (three) times daily. 06/18/15   Nishant Dhungel, MD    Family History Family History  Problem Relation Age of Onset  . Diabetes Father   . Diabetes Maternal Grandmother   .  Heart disease Neg Hx   . Hypertension Neg Hx     Social History Social History  Substance Use Topics  . Smoking status: Former Smoker    Quit date: 09/19/2013  . Smokeless tobacco: Never Used  . Alcohol use No     Allergies   Review of patient's allergies indicates no known allergies.   Review of Systems Review of Systems  Constitutional: Positive for fatigue. Negative for chills and fever.  HENT: Negative for congestion and rhinorrhea.   Eyes: Negative for visual disturbance.  Respiratory: Positive for shortness of breath. Negative for cough and wheezing.   Cardiovascular: Negative for chest pain and leg swelling.  Gastrointestinal: Positive for nausea and vomiting. Negative for abdominal pain and diarrhea.  Endocrine: Positive for polydipsia, polyphagia and polyuria.  Genitourinary: Negative for decreased urine volume, dysuria and flank pain.  Musculoskeletal: Negative for neck pain.  Skin: Negative for rash.  Allergic/Immunologic: Negative for immunocompromised state.  Neurological: Positive for weakness. Negative for syncope, light-headedness and headaches.  Hematological: Does not bruise/bleed easily.     Physical Exam Updated Vital Signs There were no vitals taken for this visit.  Physical Exam  Constitutional: He appears well-developed and well-nourished. No distress.  HENT:  Head: Normocephalic.  Mouth/Throat: No oropharyngeal exudate.  Dry mucous membranes  Eyes: Conjunctivae are normal. Pupils are equal, round, and reactive to light.  Neck: Neck supple.  Cardiovascular: Regular rhythm, normal heart sounds and intact distal pulses.  Tachycardia present.  Exam reveals no friction rub.   No murmur heard. Pulmonary/Chest: Effort normal and breath sounds normal. Tachypnea noted. No respiratory distress. He has no wheezes. He has no rales.  Abdominal: Soft. He exhibits no distension. There is no tenderness.  Musculoskeletal: He exhibits no edema.  Neurological:  He is alert. He exhibits normal muscle tone.  Skin: Skin is warm.  Nursing note and vitals reviewed.    ED Treatments / Results  Labs (all labs ordered are listed, but only abnormal results are displayed) Labs Reviewed  CBG MONITORING, ED    EKG  EKG Interpretation None       Radiology No results found.  Procedures .Critical Care Performed by: Duffy Bruce Authorized by: Duffy Bruce   Critical care provider statement:    Critical care time (minutes):  35   Critical care time was exclusive of:  Separately billable procedures and treating other patients   Critical care was necessary to treat or prevent imminent or life-threatening deterioration of the following conditions:  Circulatory failure, metabolic crisis, dehydration and endocrine crisis   Critical care was time spent personally by me on the following activities:  Ordering and performing treatments and interventions, development of treatment plan with patient or surrogate, ordering and review of laboratory studies, ordering and review of radiographic studies, discussions with primary provider, pulse oximetry, evaluation of patient's response to treatment, re-evaluation of patient's condition, examination of patient and review of  old charts   I assumed direction of critical care for this patient from another provider in my specialty: no      (including critical care time)  Medications Ordered in ED Medications - No data to display   Initial Impression / Assessment and Plan / ED Course  I have reviewed the triage vital signs and the nursing notes.  Pertinent labs & imaging results that were available during my care of the patient were reviewed by me and considered in my medical decision making (see chart for details).  Clinical Course    30 year old male with past medical history of poorly controlled type 1 diabetes with history of recurrent DKA who presents with hyperglycemia, shortness of breath, nausea,  and vomiting. On arrival, patient is tachycardic and mildly tachypneic. Suspect DKA. Etiology unclear but I suspect possible nonadherence as patient has a history of the same and does report dietary indiscretion. Will check stat labs, start IV fluid resuscitation as he he is dehydrated on exam, and monitor closely. Suspect patient will need admission. Will await BMP prior to starting insulin. Otherwise, will check chest x-ray and screening labs to evaluate for other possible trigger.  Labs, imaging as above. Labs confirm DKA. PH 7.1, CMP with pseudohyponatremia, bicarb <7, and AG incalcuable. UA with ketones. Remainder of labs show no infectious etiology. VS improving on IVF. Will start insulin gtt, mIVF, and admit to StepDown. Pt in agreement. He remains alert, oriented, with no focal neuro deficits.  Final Clinical Impressions(s) / ED Diagnoses   Final diagnoses:  Diabetic ketoacidosis without coma associated with type 1 diabetes mellitus (HCC)  Acidemia  Hyponatremia  Dehydration  Sinus tachycardia (HCC)    New Prescriptions New Prescriptions   No medications on file     Duffy Bruce, MD 12/18/15 2022

## 2015-12-18 NOTE — Progress Notes (Signed)
Received Diabetes Coordinator consult.  Patient has been seen by our team several times in the past admissions. Patient has Type 1 diabetes.   Home medications: Lantus 40-45 units daily, Novolog 10 units TID with meals.  Recommend continuing IV insulin drip. At transition, when gap is closed, start Lantus 30-35 units given 2 hours before drip is stopped. Then start Novolog SENSITIVE correction scale TID & HS if eating or every 4 hours if NPO at the time the drip is stopped.  If eating, add Novolog 4 units TID with meals as meal coverage if eating at least 50% of meals. Titrate dosages as needed. Smith Mince RN BSN CDE

## 2015-12-18 NOTE — ED Triage Notes (Signed)
He c/o generalized h/a and "some trouble with my breathing" x 1-2 days.  He is in no distress.

## 2015-12-18 NOTE — ED Notes (Signed)
Attempt to start IV to L hand and R hand unsuccessful. Primary nurse Kim at bedside and aware.

## 2015-12-18 NOTE — Progress Notes (Addendum)
Pt 2210 CBG 92. Glucostabilizer instructed RN to "Hold infusion". RN paged on-call, Kirtland Bouchard Schorr, regarding insulin gtt. Will continue to monitor.

## 2015-12-19 LAB — BASIC METABOLIC PANEL
ANION GAP: 4 — AB (ref 5–15)
ANION GAP: 7 (ref 5–15)
ANION GAP: 7 (ref 5–15)
ANION GAP: 7 (ref 5–15)
BUN: 12 mg/dL (ref 6–20)
BUN: 10 mg/dL (ref 6–20)
BUN: 12 mg/dL (ref 6–20)
BUN: 11 mg/dL (ref 6–20)
CALCIUM: 7.8 mg/dL — AB (ref 8.9–10.3)
CALCIUM: 8 mg/dL — AB (ref 8.9–10.3)
CHLORIDE: 111 mmol/L (ref 101–111)
CHLORIDE: 111 mmol/L (ref 101–111)
CHLORIDE: 112 mmol/L — AB (ref 101–111)
CO2: 15 mmol/L — AB (ref 22–32)
CO2: 15 mmol/L — ABNORMAL LOW (ref 22–32)
CO2: 17 mmol/L — AB (ref 22–32)
CO2: 21 mmol/L — AB (ref 22–32)
Calcium: 7.7 mg/dL — ABNORMAL LOW (ref 8.9–10.3)
Calcium: 8.2 mg/dL — ABNORMAL LOW (ref 8.9–10.3)
Chloride: 111 mmol/L (ref 101–111)
Creatinine, Ser: 0.67 mg/dL (ref 0.61–1.24)
Creatinine, Ser: 0.58 mg/dL — ABNORMAL LOW (ref 0.61–1.24)
Creatinine, Ser: 0.64 mg/dL (ref 0.61–1.24)
Creatinine, Ser: 0.49 mg/dL — ABNORMAL LOW (ref 0.61–1.24)
GFR calc Af Amer: >60 mL/min (ref >60)
GFR calc Af Amer: >60 mL/min (ref >60)
GFR calc Af Amer: >60 mL/min (ref >60)
GFR calc Af Amer: >60 mL/min (ref >60)
GFR calc non Af Amer: >60 mL/min (ref >60)
GFR calc non Af Amer: >60 mL/min (ref >60)
GFR calc non Af Amer: >60 mL/min (ref >60)
GLUCOSE: 117 mg/dL — AB (ref 65–99)
GLUCOSE: 124 mg/dL — AB (ref 65–99)
GLUCOSE: 153 mg/dL — AB (ref 65–99)
GLUCOSE: 169 mg/dL — AB (ref 65–99)
POTASSIUM: 3.5 mmol/L (ref 3.5–5.1)
POTASSIUM: 3.3 mmol/L — AB (ref 3.5–5.1)
POTASSIUM: 3.3 mmol/L — AB (ref 3.5–5.1)
Potassium: 3.6 mmol/L (ref 3.5–5.1)
Sodium: 134 mmol/L — ABNORMAL LOW (ref 135–145)
Sodium: 133 mmol/L — ABNORMAL LOW (ref 135–145)
Sodium: 135 mmol/L (ref 135–145)
Sodium: 136 mmol/L (ref 135–145)

## 2015-12-19 LAB — HEPATIC FUNCTION PANEL
ALK PHOS: 52 U/L (ref 38–126)
ALT: 15 U/L — AB (ref 17–63)
AST: 15 U/L (ref 15–41)
Albumin: 3.5 g/dL (ref 3.5–5.0)
BILIRUBIN DIRECT: 0.2 mg/dL (ref 0.1–0.5)
BILIRUBIN INDIRECT: 1 mg/dL — AB (ref 0.3–0.9)
Total Bilirubin: 1.2 mg/dL (ref 0.3–1.2)
Total Protein: 6.2 g/dL — ABNORMAL LOW (ref 6.5–8.1)

## 2015-12-19 LAB — GLUCOSE, CAPILLARY
GLUCOSE-CAPILLARY: 133 mg/dL — AB (ref 65–99)
GLUCOSE-CAPILLARY: 133 mg/dL — AB (ref 65–99)
GLUCOSE-CAPILLARY: 166 mg/dL — AB (ref 65–99)
GLUCOSE-CAPILLARY: 181 mg/dL — AB (ref 65–99)
GLUCOSE-CAPILLARY: 163 mg/dL — AB (ref 65–99)
GLUCOSE-CAPILLARY: 121 mg/dL — AB (ref 65–99)
GLUCOSE-CAPILLARY: 134 mg/dL — AB (ref 65–99)
Glucose-Capillary: 120 mg/dL — ABNORMAL HIGH (ref 65–99)
Glucose-Capillary: 141 mg/dL — ABNORMAL HIGH (ref 65–99)
Glucose-Capillary: 161 mg/dL — ABNORMAL HIGH (ref 65–99)
Glucose-Capillary: 169 mg/dL — ABNORMAL HIGH (ref 65–99)
Glucose-Capillary: 182 mg/dL — ABNORMAL HIGH (ref 65–99)
Glucose-Capillary: 241 mg/dL — ABNORMAL HIGH (ref 65–99)
Glucose-Capillary: 252 mg/dL — ABNORMAL HIGH (ref 65–99)
Glucose-Capillary: 96 mg/dL (ref 65–99)

## 2015-12-19 LAB — TROPONIN I: Troponin I: <0.03 ng/mL (ref <0.03)

## 2015-12-19 MED ORDER — POTASSIUM CHLORIDE 10 MEQ/100ML IV SOLN
10.0000 meq | INTRAVENOUS | Status: AC
  Administered 2015-12-19 (×2): 10 meq via INTRAVENOUS
  Filled 2015-12-19: qty 100

## 2015-12-19 MED ORDER — POTASSIUM CHLORIDE 10 MEQ/100ML IV SOLN
INTRAVENOUS | Status: AC
  Filled 2015-12-19: qty 100

## 2015-12-19 NOTE — Discharge Summary (Signed)
Physician Discharge Summary  Jeremy Ditullio DXI:[id number] DOB: 1985/09/18 DOA: 12/18/2015  PCP: Minerva Ends, MD  Admit date: 12/18/2015 Discharge date: 12/19/2015  Admitted From: home  Disposition:  home   Recommendations for Outpatient Follow-up:  1. A1c in 2-3 wks  Home Health:  none  Equipment/Devices:  none    Discharge Condition:  Leaving AMA- not stable for discharge yet   CODE STATUS:  Full code   Diet recommendation:  Carb modified, heart heatlhy Consultations:  none    Discharge Diagnoses:  Principal Problem:   DKA (diabetic ketoacidoses) ([place]) Active Problems:   DM (diabetes mellitus), type 1, uncontrolled ([place])   Sinus tachycardia ([place])   Acute renal failure ([place])   Headache   Chest pain    Subjective:   Brief Summary: [name] [name] is a 30 y.o. male with medical history significant of diabetes mellitus type 1 and DKA. Patient states that he has a history of headache and shortness of breath that started about 2 days ago. He states symptoms are similar to previous episodes of DKA, however and, more severe. Since 2 days ago, his symptoms have remained unchanged. Headache is frontal and intermittent intermittent. Dyspnea is worse when he is at work moving around. He feels that his current episode of DKA could be related to him not taking his Lantus the night before last, in addition to a very carbohydrate heavy diet. Reports no coughing, vision changes, abdominal pain, dysuria. He does report history of polydipsia and polyuria. He also reports chest pain that is substernal and intermittent. Not associated with dyspnea or diaphoresis. Described as pressure-like. He decided to seek evaluation in the emergency department since his symptoms fail to improve.  Hospital Course:  DKA- DM 1, uncontrolled - still acidotic and on insulin infusion - wants to leave AMA- advised to take home insulin appropriately and take a carb modified diet.   Sinus tachycardia - HR In  low 100s now - likely due to ongoing dehydration- plan was to continue IVF  AKI - resolved  Chest pain - unremarkable EKG -first troponin negative- no further troponin done as it was suspected to be non-cardiac  Discharge Instructions     Medication List    TAKE these medications   acetaminophen 325 MG tablet Commonly known as:  TYLENOL Take 2 tablets (650 mg total) by mouth every 6 (six) hours as needed for mild pain, moderate pain, fever or headache.   alum & mag hydroxide-simeth 200-200-20 MG/5ML suspension Commonly known as:  MAALOX/MYLANTA Take 30 mLs by mouth every 6 (six) hours as needed for indigestion or heartburn.   glucose blood test strip Commonly known as:  TRUE METRIX BLOOD GLUCOSE TEST 1 each by Other route 3 (three) times daily.   insulin aspart 100 UNIT/ML FlexPen Commonly known as:  NOVOLOG Inject 10 Units into the skin 3 (three) times daily with meals.   Insulin Glargine 100 UNIT/ML Solostar Pen Commonly known as:  LANTUS Inject 45 Units into the skin daily at 10 pm.   Insulin Syringe-Needle U-100 31G X 5/16" 0.5 ML Misc Commonly known as:  B-D INS SYRINGE 0.5CC/31GX5/16 1 each by Does not apply route 2 (two) times daily.   pantoprazole 40 MG tablet Commonly known as:  PROTONIX Take 1 tablet (40 mg total) by mouth daily.   TRUE METRIX METER w/Device Kit 1 each by Does not apply route as needed.   TRUEPLUS LANCETS 28G Misc 1 each by Does not apply route 3 (three) times daily.  No Known Allergies   Procedures/Studies:  Dg Chest Portable 1 View  Result Date: 12/18/2015 CLINICAL DATA:  Shortness breath beginning last night. EXAM: PORTABLE CHEST 1 VIEW COMPARISON:  12/01/2015 FINDINGS: The heart size and mediastinal contours are within normal limits. Both lungs are clear. The visualized skeletal structures are unremarkable. IMPRESSION: No active disease. Electronically Signed   By: Rolm Baptise M.D.   On: 12/18/2015 10:04  Dg Chest  Port 1 View  Result Date: 12/01/2015 CLINICAL DATA:  Diabetic ketoacidosis. EXAM: PORTABLE CHEST 1 VIEW COMPARISON:  07/22/2015 FINDINGS: The heart size and mediastinal contours are within normal limits. Both lungs are clear. The visualized skeletal structures are unremarkable. IMPRESSION: No active disease. Electronically Signed   By: Lucienne Capers M.D.   On: 12/01/2015 23:28        Discharge Exam: Vitals:   12/19/15 0900 12/19/15 1157  BP: (!) 139/95   Pulse: 90   Resp: 16   Temp:  98.6 F (37 C)   Vitals:   12/19/15 0600 12/19/15 0800 12/19/15 0900 12/19/15 1157  BP: (!) 146/109 119/84 (!) 139/95   Pulse: 88 88 90   Resp: _0 Temp:  98.7 F (37.1 C)  98.6 F (37 C)  TempSrc:  Oral  Oral  SpO2: 99% 100% 100%   Weight:      Height:        General: Pt is alert, awake, not in acute distress Cardiovascular: RRR, S1/S2 +, no rubs, no gallops Respiratory: CTA bilaterally, no wheezing, no rhonchi Abdominal: Soft, NT, ND, bowel sounds + Extremities: no edema, no cyanosis    The results of significant diagnostics from this hospitalization (including imaging, microbiology, ancillary and laboratory) are listed below for reference.     Microbiology: No results found for this or any previous visit (from the past 240 hour(s)).   Labs: BNP (last 3 results) No results for input(s): BNP in the last 8760 hours. Basic Metabolic Panel:  Recent Labs Lab 12/18/15 1235 12/18/15 1720 12/18/15 2038 12/19/15 0022 12/19/15 0411 12/19/15 0814  NA 135 134* 135 134* 133* 135  K 4.4 3.3* 3.8 3.6 3.5 3.3*  CL 110 113* 112* 112* 111 111  CO2 8* 13* 16* 15* 15* 17*  GLUCOSE 340* 157* 93 124* 169* 153*  BUN _1 CREATININE 1.02 0.81 0.63 0.67 0.58* 0.64  CALCIUM 8.0* 7.7* 7.9* 7.8* 7.7* 8.0*  MG 1.7  --   --   --   --   --    Liver Function Tests:  Recent Labs Lab 12/18/15 0957 12/19/15 0411  AST 25 15  ALT 21 15*  ALKPHOS 84 52  BILITOT 1.9* 1.2   PROT 8.4* 6.2*  ALBUMIN 4.9 3.5   No results for input(s): LIPASE, AMYLASE in the last 168 hours. No results for input(s): AMMONIA in the last 168 hours. CBC:  Recent Labs Lab 12/18/15 0957 12/18/15 1009  WBC 9.3  --   NEUTROABS 7.8*  --   HGB 15.7 17.0  HCT 45.3 50.0  MCV 89.7  --   PLT 350  --    Cardiac Enzymes:  Recent Labs Lab 12/18/15 1235 12/18/15 1720 12/19/15 0022  TROPONINI <0.03 <0.03 <0.03   BNP: Invalid input(s): POCBNP CBG:  Recent Labs Lab 12/19/15 0843 12/19/15 0916 12/19/15 1012 12/19/15 1055 12/19/15 1155  GLUCAP 182* 241* 252* 169* 121*   D-Dimer No results for input(s): DDIMER in the last 72 hours. Hgb  A1c No results for input(s): HGBA1C in the last 72 hours. Lipid Profile No results for input(s): CHOL, HDL, LDLCALC, TRIG, CHOLHDL, LDLDIRECT in the last 72 hours. Thyroid function studies No results for input(s): TSH, T4TOTAL, T3FREE, THYROIDAB in the last 72 hours.  Invalid input(s): FREET3 Anemia work up No results for input(s): VITAMINB12, FOLATE, FERRITIN, TIBC, IRON, RETICCTPCT in the last 72 hours. Urinalysis    Component Value Date/Time   COLORURINE YELLOW 12/18/2015 0940   APPEARANCEUR CLEAR 12/18/2015 0940   LABSPEC 1.030 12/18/2015 0940   PHURINE 5.0 12/18/2015 0940   GLUCOSEU >1000 (A) 12/18/2015 0940   HGBUR TRACE (A) 12/18/2015 0940   BILIRUBINUR NEGATIVE 12/18/2015 0940   BILIRUBINUR neg 08/10/2015 1211   KETONESUR >80 (A) 12/18/2015 0940   PROTEINUR 30 (A) 12/18/2015 0940   UROBILINOGEN 0.2 08/10/2015 1211   UROBILINOGEN 0.2 03/22/2015 0913   NITRITE NEGATIVE 12/18/2015 0940   LEUKOCYTESUR NEGATIVE 12/18/2015 0940   Sepsis Labs Invalid input(s): PROCALCITONIN,  WBC,  LACTICIDVEN Microbiology No results found for this or any previous visit (from the past 240 hour(s)).   Time coordinating discharge: Over 30 minutes  SIGNED:   Debbe Odea, MD  [facility] 12/19/2015, 1:16 PM Pager   If  7PM-7AM, please contact night-coverage [email or web address] Password [email or web address]

## 2015-12-19 NOTE — Progress Notes (Signed)
Patient verbalized that he is leaving the hospital AMA.  Verbalized that he was leaving this am, but was able to get patient to stay during the day.  Now patient says he has a ride home, so he is leaving now.  Notified Dr. Butler Denmark about patient leaving AMA. IV's removed intact x2.  Patient dressed and walked out of the hospital.  Lorrin Jackson RN

## 2016-01-26 ENCOUNTER — Emergency Department (HOSPITAL_COMMUNITY)
Admission: EM | Admit: 2016-01-26 | Discharge: 2016-01-26 | Disposition: A | Discharge status: Home / Self Care | Payer: Self-pay | Attending: Emergency Medicine | Admitting: Emergency Medicine

## 2016-01-26 ENCOUNTER — Encounter (HOSPITAL_COMMUNITY): Payer: Self-pay | Admitting: *Deleted

## 2016-01-26 DIAGNOSIS — R519 Headache, unspecified: Secondary | ICD-10-CM

## 2016-01-26 DIAGNOSIS — E1165 Type 2 diabetes mellitus with hyperglycemia: Secondary | ICD-10-CM | POA: Insufficient documentation

## 2016-01-26 DIAGNOSIS — R51 Headache: Secondary | ICD-10-CM

## 2016-01-26 DIAGNOSIS — Z794 Long term (current) use of insulin: Secondary | ICD-10-CM | POA: Insufficient documentation

## 2016-01-26 DIAGNOSIS — Z87891 Personal history of nicotine dependence: Secondary | ICD-10-CM | POA: Insufficient documentation

## 2016-01-26 DIAGNOSIS — Z79899 Other long term (current) drug therapy: Secondary | ICD-10-CM | POA: Insufficient documentation

## 2016-01-26 LAB — CBG MONITORING, ED
GLUCOSE-CAPILLARY: 249 mg/dL — AB (ref 65–99)
GLUCOSE-CAPILLARY: 264 mg/dL — AB (ref 65–99)
Glucose-Capillary: 345 mg/dL — ABNORMAL HIGH (ref 65–99)

## 2016-01-26 MED ORDER — PROCHLORPERAZINE EDISYLATE 5 MG/ML IJ SOLN
10.0000 mg | Freq: Once | INTRAMUSCULAR | Status: AC
  Administered 2016-01-26: 10 mg via INTRAVENOUS
  Filled 2016-01-26: qty 2

## 2016-01-26 MED ORDER — INSULIN ASPART 100 UNIT/ML ~~LOC~~ SOLN
10.0000 [IU] | Freq: Once | SUBCUTANEOUS | Status: AC
  Administered 2016-01-26: 10 [IU] via INTRAVENOUS
  Filled 2016-01-26: qty 1

## 2016-01-26 MED ORDER — SODIUM CHLORIDE 0.9 % IV BOLUS (SEPSIS)
1000.0000 mL | Freq: Once | INTRAVENOUS | Status: AC
  Administered 2016-01-26: 1000 mL via INTRAVENOUS

## 2016-01-26 MED ORDER — BUTALBITAL-APAP-CAFFEINE 50-325-40 MG PO TABS: 1.0000 | ORAL_TABLET | Freq: Four times a day (QID) | ORAL | 0 refills | Status: DC | PRN

## 2016-01-26 MED ORDER — DIPHENHYDRAMINE HCL 50 MG/ML IJ SOLN
12.5000 mg | Freq: Once | INTRAMUSCULAR | Status: AC
  Administered 2016-01-26: 12.5 mg via INTRAVENOUS
  Filled 2016-01-26: qty 1

## 2016-01-26 MED ORDER — SODIUM CHLORIDE 0.9 % IV BOLUS (SEPSIS)
500.0000 mL | Freq: Once | INTRAVENOUS | Status: AC
  Administered 2016-01-26: 500 mL via INTRAVENOUS

## 2016-01-26 NOTE — Discharge Instructions (Signed)
Follow up with wellness as soon as possible. Keep an eye on your sugars. Dr. Armen PickupFunches may want to talk to you about adjusting your insulin dose.

## 2016-01-26 NOTE — Progress Notes (Signed)
EDCM noted patient has recent admission in July from 07/29 to 07/30.  At that time patient left AMA from hospital.  Prior to this admission patient was admitted 07/12 to 07/14.  Both admissions for DKA.  Patient listed as not having insurance.  [facility]. EDCM spoke to patient at bedside.  Patient reports he has all of his insulin at home and he has been taking it as directed. He reports he does not have any difficulty with transportation to his appointments. He reports he is checking his blood sugar at home. He reports he does not have any difficulty affording his medications, [facility]. He reports he hasn't had an appointment there in a while.  EDCM [facility]'s [facility].  EDCM also provided patient with contact information for Nutrition and diabetic teaching classes in [place] through [facility]. EDCM discussed the importance of follow up, the seriousness of diabetes and possible complications.  EDCM also provided the following:   [facility]  [facility], informed patient of services there and walk in times.  EDCM also provided patient with list of pcps who accept self pay patients, list of discount pharmacies and websites needymeds.org and GoodRX.com for medication assistance, phone number to inquire about the orange card, phone number to inquire about Mediciad, phone number to inquire about the Affordable Care Act, financial resources in the community such as local churches, salvation army, urban ministries, and dental assistance for uninsured patients.  Patient thankful for resources.  No further EDCM needs at this time.

## 2016-01-26 NOTE — ED Triage Notes (Signed)
Per EMS, pt complains of headache for the past 2 days. Pt denies fever. Pt states his CBG has been elevated over the past few days, EMS CBG 301.

## 2016-01-26 NOTE — ED Notes (Signed)
2 unsuccessful attempts for peripheral iv.

## 2016-01-26 NOTE — ED Provider Notes (Signed)
[place] DEPT Provider Note   CSN: [id number] Arrival date & time: 01/26/16  1516     History   Chief Complaint Chief Complaint  Patient presents with  . Headache  . Hyperglycemia    HPI   [name] [name] is an 30 y.o. male with history of DM and CKD who presents to the ED for evaluation of headache. He reports a constant frontal and bilateral temporal headache for the past two days. It was gradual onset, now constant. Rates his pain 6/10. He tried taking some OTC headache medicine with no relief. He endorses history of headaches and states this feels similar. He states he has noticed his CBG has been elevated over the past couple of days as well, running in the 300s. Denies abdominal pain, nausea, or vomiting. He states he is taking his home medication as prescribed. Trying to avoid salt and carbohydrates in his diet. Denies fever or chills. Denies new numbness, weakness, or dizziness.  PCP: Minerva Ends, MD   Past Medical History:  Diagnosis Date  . Diabetes mellitus without complication ([place]) Dx 1017  . Renal insufficiency 2011   pt was on dialysis for a short time approx 2011, pt is no longer on dialysis    Patient Active Problem List   Diagnosis Date Noted  . Headache 12/18/2015  . Chest pain 12/18/2015  . DKA, type 1, not at goal [facility]) 12/02/2015  . DKA (diabetic ketoacidoses) ([place]) 12/01/2015  . Marijuana use 07/22/2015  . Proteinuria with type 1 diabetes mellitus ([place]) 07/22/2015  . High blood pressure 07/22/2015  . Uncontrolled type 1 diabetes mellitus with ketoacidosis without coma ([place])   . Hypocalcemia   . Acute renal failure ([place])   . Hypomagnesemia   . Abnormal EKG 06/15/2015  . Sinus tachycardia ([place])   . Tachypnea   . Tinea pedis 12/17/2014  . Adjustment disorder with mixed anxiety and depressed mood 09/03/2014  . Protein-calorie malnutrition, severe ([place]) 07/06/2014  . DM (diabetes mellitus), type 1, uncontrolled ([place]) 05/01/2014  . HLD  (hyperlipidemia) 04/26/2014  . Diabetic ketoacidosis without coma associated with type 1 diabetes mellitus ([place])   . Substance abuse   . Fatigue 03/10/2012    History reviewed. No pertinent surgical history.     Home Medications    Prior to Admission medications   Medication Sig Start Date End Date Taking? Authorizing Provider  Blood Glucose Monitoring Suppl (TRUE METRIX METER) w/Device KIT 1 each by Does not apply route as needed. 06/28/15  Yes Josalyn Funches, MD  glucose blood (TRUE METRIX BLOOD GLUCOSE TEST) test strip 1 each by Other route 3 (three) times daily. 06/28/15  Yes Josalyn Funches, MD  insulin aspart (NOVOLOG) 100 UNIT/ML FlexPen Inject 10 Units into the skin 3 (three) times daily with meals. Patient taking differently: Inject 10-12 Units into the skin 3 (three) times daily with meals.  06/29/15  Yes Josalyn Funches, MD  Insulin Glargine (LANTUS) 100 UNIT/ML Solostar Pen Inject 45 Units into the skin daily at 10 pm. Patient taking differently: Inject 40 Units into the skin daily at 10 pm.  12/03/15  Yes Modena Jansky, MD  Insulin Syringe-Needle U-100 (B-D INS SYRINGE 0.5CC/31GX5/16) 31G X 5/16" 0.5 ML MISC 1 each by Does not apply route 2 (two) times daily. 06/18/15  Yes Nishant Dhungel, MD  TRUEPLUS LANCETS 28G MISC 1 each by Does not apply route 3 (three) times daily. 06/18/15  Yes Nishant Dhungel, MD  acetaminophen (TYLENOL) 325 MG tablet Take 2 tablets (  650 mg total) by mouth every 6 (six) hours as needed for mild pain, moderate pain, fever or headache. Patient not taking: Reported on 12/18/2015 12/03/15   Modena Jansky, MD  alum & mag hydroxide-simeth (MAALOX/MYLANTA) 200-200-20 MG/5ML suspension Take 30 mLs by mouth every 6 (six) hours as needed for indigestion or heartburn. Patient not taking: Reported on 12/18/2015 12/03/15   Modena Jansky, MD  pantoprazole (PROTONIX) 40 MG tablet Take 1 tablet (40 mg total) by mouth daily. Patient not taking: Reported on 12/18/2015  12/03/15   Modena Jansky, MD    Family History Family History  Problem Relation Age of Onset  . Diabetes Father   . Diabetes Maternal Grandmother   . Heart disease Neg Hx   . Hypertension Neg Hx     Social History Social History  Substance Use Topics  . Smoking status: Former Smoker    Quit date: 09/19/2013  . Smokeless tobacco: Never Used  . Alcohol use No     Allergies   Review of patient's allergies indicates no known allergies.   Review of Systems Review of Systems 10 Systems reviewed and are negative for acute change except as noted in the HPI.  Physical Exam Updated Vital Signs BP (!) 140/101 (BP Location: Left Arm) Comment: [place], PA notified  Pulse 110   Temp 98.4 F (36.9 C) (Oral)   Resp 18   SpO2 100%   Physical Exam  Constitutional: He is oriented to person, place, and time.  HENT:  Right Ear: External ear normal.  Left Ear: External ear normal.  Nose: Nose normal.  Mouth/Throat: Oropharynx is clear and moist. No oropharyngeal exudate.  Eyes: Conjunctivae and EOM are normal. Pupils are equal, round, and reactive to light.  Neck: Normal range of motion. Neck supple.  No meningismus  Cardiovascular: Normal rate, regular rhythm, normal heart sounds and intact distal pulses.   Pulmonary/Chest: Effort normal and breath sounds normal. No respiratory distress. He has no wheezes.  Abdominal: Soft. Bowel sounds are normal. He exhibits no distension. There is no tenderness. There is no rebound and no guarding.  Musculoskeletal: He exhibits no edema.  Lymphadenopathy:    He has no cervical adenopathy.  Neurological: He is alert and oriented to person, place, and time. No cranial nerve deficit.  5/5 strength throughout Normal finger to nose No pronator drift  Skin: Skin is warm and dry.  Psychiatric: He has a normal mood and affect.  Nursing note and vitals reviewed.    ED Treatments / Results  Labs (all labs ordered are listed, but only abnormal  results are displayed) Labs Reviewed  CBG MONITORING, ED - Abnormal; Notable for the following:       Result Value   Glucose-Capillary 249 (*)    All other components within normal limits  CBG MONITORING, ED - Abnormal; Notable for the following:    Glucose-Capillary 345 (*)    All other components within normal limits  CBG MONITORING, ED - Abnormal; Notable for the following:    Glucose-Capillary 264 (*)    All other components within normal limits    EKG  EKG Interpretation None       Radiology No results found.  Procedures Procedures (including critical care time)  Medications Ordered in ED Medications  sodium chloride 0.9 % bolus 1,000 mL (0 mLs Intravenous Stopped 01/26/16 2126)  prochlorperazine (COMPAZINE) injection 10 mg (10 mg Intravenous Given 01/26/16 2021)  diphenhydrAMINE (BENADRYL) injection 12.5 mg (12.5 mg Intravenous Given 01/26/16  2021)  insulin aspart (novoLOG) injection 10 Units (10 Units Intravenous Given 01/26/16 2123)  sodium chloride 0.9 % bolus 500 mL (0 mLs Intravenous Stopped 01/26/16 2254)     Initial Impression / Assessment and Plan / ED Course  I have reviewed the triage vital signs and the nursing notes.  Pertinent labs & imaging results that were available during my care of the patient were reviewed by me and considered in my medical decision making (see chart for details).  Clinical Course    9:12 PM Headache resolved. Pt repeat CBG elevated to 345. Will give dose of insulin and additional fluids. Pt continues to rest comfortably and denies abdominal pain, nausea or vomiting.  Pt feels ready to go home. CBG still elevated. However, no headache, no abdominal pain, no nausea/vomiting. Encouraged continuing oral hydration and continuing insulin as prescribed. Encouraged close f/u with PCP. ER return precautions given.  Final Clinical Impressions(s) / ED Diagnoses   Final diagnoses:  Acute nonintractable headache, unspecified headache type    Type 2 diabetes mellitus with hyperglycemia, with long-term current use of insulin ([place])    New Prescriptions Discharge Medication List as of 01/26/2016 10:48 PM    START taking these medications   Details  butalbital-acetaminophen-caffeine (FIORICET) 50-325-40 MG tablet Take 1-2 tablets by mouth every 6 (six) hours as needed for headache., Starting Wed 01/26/2016, Until Thu 01/25/2017, Print         Anne Ng, PA-C 01/27/16 0015    Davonna Belling, MD 01/27/16 [phone]

## 2016-01-28 ENCOUNTER — Telehealth: Payer: Self-pay

## 2016-01-28 NOTE — Telephone Encounter (Signed)
This Case Manager received communication from Radford PaxAmy Ferrero, RN CM [facility] that patient needing ED follow-up appointment with Dr. Armen PickupFunches (patient's PCP). Placed call to patient and discussed need for follow-up appointment, and patient agreeable. Appointment scheduled for 02/07/16 at 1430 with Dr. Armen PickupFunches.  Patient aware of clinic's location and phone number. Patient uninsured. Informed patient that clinic has onsite Financial Counselors and informed him he would benefit from meeting with Financial Counselor to determine if eligible for [employer] or [employer]. Patient verbalized understanding and appreciative of call.

## 2016-02-01 MED FILL — ULTICARE PEN NDL 4MM 32G: 32G X 4 MM | 50 days supply | Qty: 100 | Fill #1

## 2016-02-01 MED FILL — !NOVOLOG FLEXPEN SYRINGE 1: 100/ML | 30 days supply | Qty: 9 | Fill #3

## 2016-02-01 MED FILL — LANTUS SOLOSTAR 100 UNITS/M: 100 | 28 days supply | Qty: 9 | Fill #2

## 2016-02-07 ENCOUNTER — Ambulatory Visit: Payer: Self-pay | Attending: Family Medicine | Admitting: Family Medicine

## 2016-02-07 ENCOUNTER — Encounter: Payer: Self-pay | Admitting: Family Medicine

## 2016-02-07 VITALS — BP 134/97 | HR 106 | Temp 98.5°F | Wt 118.0 lb

## 2016-02-07 DIAGNOSIS — Z87891 Personal history of nicotine dependence: Secondary | ICD-10-CM | POA: Insufficient documentation

## 2016-02-07 DIAGNOSIS — Z79899 Other long term (current) drug therapy: Secondary | ICD-10-CM | POA: Insufficient documentation

## 2016-02-07 DIAGNOSIS — Z794 Long term (current) use of insulin: Secondary | ICD-10-CM | POA: Insufficient documentation

## 2016-02-07 DIAGNOSIS — E1010 Type 1 diabetes mellitus with ketoacidosis without coma: Secondary | ICD-10-CM | POA: Insufficient documentation

## 2016-02-07 LAB — POCT URINALYSIS DIPSTICK
Bilirubin, UA: NEGATIVE
Ketones, UA: NEGATIVE
LEUKOCYTES UA: NEGATIVE
NITRITE UA: NEGATIVE
PH UA: 5.5
Protein, UA: NEGATIVE
RBC UA: NEGATIVE
Spec Grav, UA: <=1.005
UROBILINOGEN UA: 0.2

## 2016-02-07 LAB — GLUCOSE, POCT (MANUAL RESULT ENTRY)
POC GLUCOSE: 291 mg/dL — AB (ref 70–99)
POC Glucose: 591 mg/dl — AB (ref 70–99)

## 2016-02-07 MED ORDER — INSULIN ASPART 100 UNIT/ML FLEXPEN: PEN_INJECTOR | SUBCUTANEOUS | 5 refills | Status: DC

## 2016-02-07 MED ORDER — INSULIN ASPART 100 UNIT/ML ~~LOC~~ SOLN
20.0000 [IU] | Freq: Once | SUBCUTANEOUS | Status: AC
  Administered 2016-02-07: 20 [IU] via SUBCUTANEOUS

## 2016-02-07 MED ORDER — INSULIN GLARGINE 100 UNIT/ML SOLOSTAR PEN: 45.0000 [IU] | PEN_INJECTOR | Freq: Every day | SUBCUTANEOUS | 3 refills | Status: DC

## 2016-02-07 MED ORDER — INSULIN ASPART 100 UNIT/ML FLEXPEN: 15.0000 [IU] | PEN_INJECTOR | Freq: Three times a day (TID) | SUBCUTANEOUS | 5 refills | Status: DC

## 2016-02-07 NOTE — Assessment & Plan Note (Signed)
Uncontrolled diabetes type 1 with hyperglycemia. No ketonuria P: Increase novolog to 15 U TID with meals and 5 U with snacks lantus 45 U daily  Close f/u in two weeks Return to work note provided

## 2016-02-07 NOTE — Progress Notes (Signed)
Subjective:  Patient ID: [name] [name], male    DOB: 02/26/1986  Age: 30 y.o. MRN: [id number]  CC: Diabetes and Hyperglycemia   HPI [name] [name] presents for   1. CHRONIC DIABETES  Disease Monitoring  Blood Sugar Ranges:   Fasting: 85-160  Post prandial: 160-250 lunch, 225-350 dinner, 300 + before bed   Polyuria: yes, at bed time    Visual problems: no   Medication Compliance: yes  Medication Side Effects  Hypoglycemia: no   Preventitive Health Care  Eye Exam: due   Foot Exam: done today   Diet pattern: eating 3 meals a day   Exercise: no   Social History  Substance Use Topics  . Smoking status: Former Smoker    Quit date: 09/19/2013  . Smokeless tobacco: Never Used  . Alcohol use No    Outpatient Medications Prior to Visit  Medication Sig Dispense Refill  . acetaminophen (TYLENOL) 325 MG tablet Take 2 tablets (650 mg total) by mouth every 6 (six) hours as needed for mild pain, moderate pain, fever or headache. (Patient not taking: Reported on 12/18/2015)    . alum & mag hydroxide-simeth (MAALOX/MYLANTA) 200-200-20 MG/5ML suspension Take 30 mLs by mouth every 6 (six) hours as needed for indigestion or heartburn. (Patient not taking: Reported on 12/18/2015) 355 mL 0  . Blood Glucose Monitoring Suppl (TRUE METRIX METER) w/Device KIT 1 each by Does not apply route as needed. 1 kit 0  . butalbital-acetaminophen-caffeine (FIORICET) 50-325-40 MG tablet Take 1-2 tablets by mouth every 6 (six) hours as needed for headache. 20 tablet 0  . glucose blood (TRUE METRIX BLOOD GLUCOSE TEST) test strip 1 each by Other route 3 (three) times daily. 100 each 11  . insulin aspart (NOVOLOG) 100 UNIT/ML FlexPen Inject 10 Units into the skin 3 (three) times daily with meals. (Patient taking differently: Inject 10-12 Units into the skin 3 (three) times daily with meals. ) 15 mL 5  . Insulin Glargine (LANTUS) 100 UNIT/ML Solostar Pen Inject 45 Units into the skin daily at 10 pm. (Patient taking  differently: Inject 40 Units into the skin daily at 10 pm. )    . Insulin Syringe-Needle U-100 (B-D INS SYRINGE 0.5CC/31GX5/16) 31G X 5/16" 0.5 ML MISC 1 each by Does not apply route 2 (two) times daily. 60 each 11  . pantoprazole (PROTONIX) 40 MG tablet Take 1 tablet (40 mg total) by mouth daily. (Patient not taking: Reported on 12/18/2015) 30 tablet 0  . TRUEPLUS LANCETS 28G MISC 1 each by Does not apply route 3 (three) times daily. 100 each 12   No facility-administered medications prior to visit.     ROS Review of Systems  Constitutional: Negative for chills, fatigue, fever and unexpected weight change.  Eyes: Negative for visual disturbance.  Respiratory: Negative for cough and shortness of breath.   Cardiovascular: Negative for chest pain, palpitations and leg swelling.  Gastrointestinal: Negative for abdominal pain, blood in stool, constipation, diarrhea, nausea and vomiting.  Endocrine: Positive for polyuria. Negative for polydipsia and polyphagia.  Musculoskeletal: Negative for arthralgias, back pain, gait problem, myalgias and neck pain.  Skin: Negative for rash.  Allergic/Immunologic: Negative for immunocompromised state.  Hematological: Negative for adenopathy. Does not bruise/bleed easily.  Psychiatric/Behavioral: Negative for dysphoric mood, sleep disturbance and suicidal ideas. The patient is not nervous/anxious.     Objective:  BP (!) 134/97 (BP Location: Right Arm, Patient Position: Sitting, Cuff Size: Small)   Pulse (!) 106   Temp 98.5 F (36.9  C) (Oral)   Wt 118 lb (53.5 kg)   SpO2 97%   BMI 19.05 kg/m   BP/Weight 02/07/2016 01/26/2016 7/71/1657  Systolic BP 903 833 383  Diastolic BP 97 87 291  Wt. (Lbs) 118 - -  BMI 19.05 - -   Wt Readings from Last 3 Encounters:  02/07/16 118 lb (53.5 kg)  12/18/15 105 lb 13.1 oz (48 kg)  12/01/15 113 lb (51.3 kg)    Physical Exam  Constitutional: He appears well-developed and well-nourished. No distress.  HENT:  Head:  Normocephalic and atraumatic.  Neck: Normal range of motion. Neck supple.  Cardiovascular: Normal rate, regular rhythm, normal heart sounds and intact distal pulses.   Pulmonary/Chest: Effort normal and breath sounds normal.  Musculoskeletal: He exhibits no edema.  Neurological: He is alert.  Skin: Skin is warm and dry. No rash noted. No erythema.  Psychiatric: He has a normal mood and affect.   CBG 591 Lab Results  Component Value Date   HGBA1C 13.9 (H) 12/02/2015   UA: >1000 glucose, negative ketones, neg nitrite, LE and blood.   Repeat CBG 294   Assessment & Plan:  [name] [name] was seen today for diabetes and hyperglycemia.  Diagnoses and all orders for this visit:  Uncontrolled type 1 diabetes mellitus with ketoacidosis without coma ([place]) -     POCT glucose (manual entry) -     insulin aspart (novoLOG) injection 20 Units; Inject 0.2 mLs (20 Units total) into the skin once. -     POCT urinalysis dipstick -     Discontinue: insulin aspart (NOVOLOG) 100 UNIT/ML FlexPen; Inject 15 Units into the skin 3 (three) times daily with meals. -     insulin aspart (NOVOLOG) 100 UNIT/ML FlexPen; 15 U three times daily with meals and 5 U up to twice daily with snacks -     POCT glucose (manual entry) -     Insulin Glargine (LANTUS) 100 UNIT/ML Solostar Pen; Inject 45 Units into the skin daily at 10 pm.   There are no diagnoses linked to this encounter.  No orders of the defined types were placed in this encounter.   Follow-up: Return in about 2 weeks (around 02/21/2016) for diabetes type 1 .   [name] MD

## 2016-02-07 NOTE — Patient Instructions (Addendum)
[name] [name] was seen today for diabetes and hyperglycemia.  Diagnoses and all orders for this visit:  Uncontrolled type 1 diabetes mellitus with ketoacidosis without coma ([facility]) -     POCT glucose (manual entry) -     insulin aspart (novoLOG) injection 20 Units; Inject 0.2 mLs (20 Units total) into the skin once. -     POCT urinalysis dipstick -     Discontinue: insulin aspart (NOVOLOG) 100 UNIT/ML FlexPen; Inject 15 Units into the skin 3 (three) times daily with meals. -     insulin aspart (NOVOLOG) 100 UNIT/ML FlexPen; 15 U three times daily with meals and 5 U up to twice daily with snacks -     POCT glucose (manual entry) -     Insulin Glargine (LANTUS) 100 UNIT/ML Solostar Pen; Inject 45 Units into the skin daily at 10 pm.    F/u in 2 weeks for diabetes type 1, blood sugar review   Dr. Armen PickupFunches

## 2016-03-08 IMAGING — CR DG CHEST 1V PORT
1 series · 1 of 1 positions shown · non-contrast
Comparison: 06/15/2015

CLINICAL DATA: Shortness of breath.

EXAM:
PORTABLE CHEST 1 VIEW

[AP]
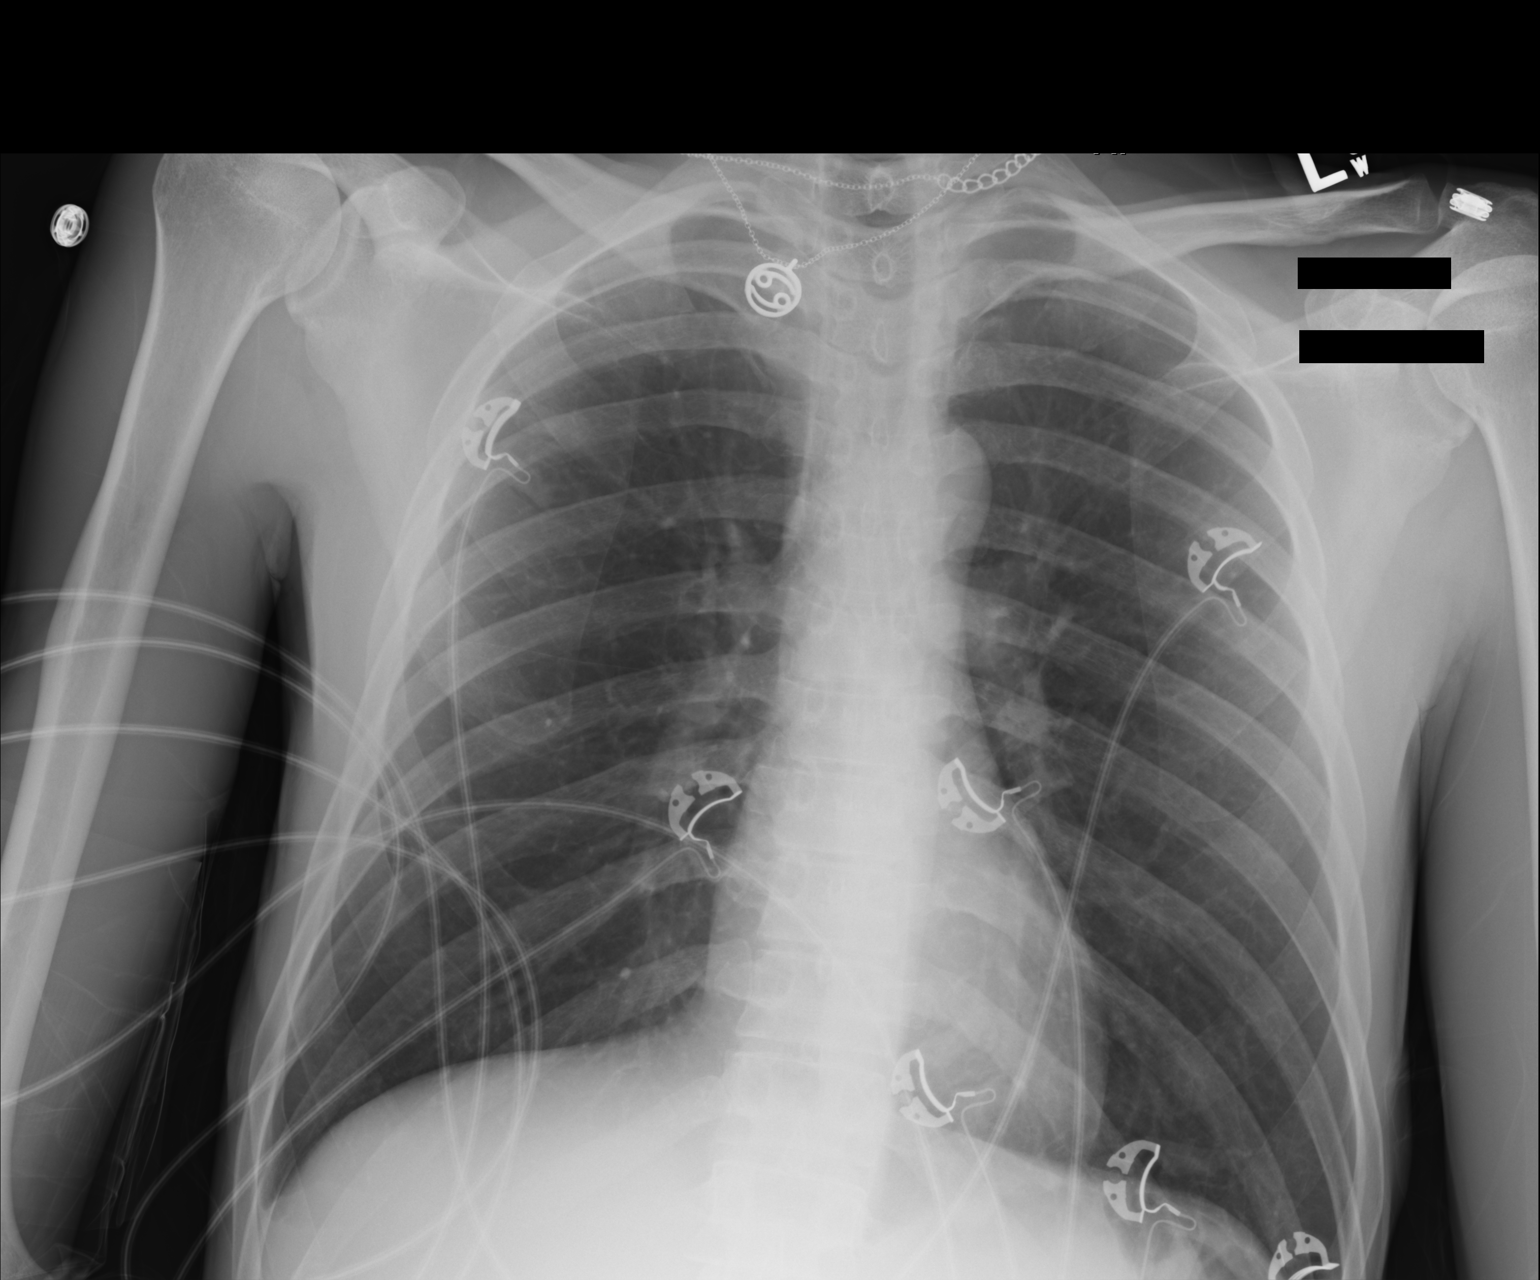

[1 of 1 positions shown; findings below may reference images not displayed]

FINDINGS: The heart size and mediastinal contours are within normal limits.
There is no evidence of pulmonary edema, consolidation,
pneumothorax, nodule or pleural fluid. The visualized skeletal
structures are unremarkable.
IMPRESSION: No active disease.

## 2016-03-09 ENCOUNTER — Inpatient Hospital Stay (HOSPITAL_COMMUNITY)
Admission: EM | Admit: 2016-03-09 | Discharge: 2016-03-11 | DRG: 638 | Disposition: A | Discharge status: Home / Self Care | Payer: Self-pay | Attending: Internal Medicine | Admitting: Internal Medicine

## 2016-03-09 ENCOUNTER — Encounter (HOSPITAL_COMMUNITY): Payer: Self-pay | Admitting: Emergency Medicine

## 2016-03-09 DIAGNOSIS — Z87891 Personal history of nicotine dependence: Secondary | ICD-10-CM

## 2016-03-09 DIAGNOSIS — Z9111 Patient's noncompliance with dietary regimen: Secondary | ICD-10-CM

## 2016-03-09 DIAGNOSIS — E860 Dehydration: Secondary | ICD-10-CM | POA: Diagnosis present

## 2016-03-09 DIAGNOSIS — R112 Nausea with vomiting, unspecified: Secondary | ICD-10-CM

## 2016-03-09 DIAGNOSIS — Z833 Family history of diabetes mellitus: Secondary | ICD-10-CM

## 2016-03-09 DIAGNOSIS — IMO0002 Reserved for concepts with insufficient information to code with codable children: Secondary | ICD-10-CM | POA: Diagnosis present

## 2016-03-09 DIAGNOSIS — N179 Acute kidney failure, unspecified: Secondary | ICD-10-CM | POA: Diagnosis present

## 2016-03-09 DIAGNOSIS — Z9114 Patient's other noncompliance with medication regimen: Secondary | ICD-10-CM

## 2016-03-09 DIAGNOSIS — R000 Tachycardia, unspecified: Secondary | ICD-10-CM | POA: Diagnosis present

## 2016-03-09 DIAGNOSIS — E1065 Type 1 diabetes mellitus with hyperglycemia: Secondary | ICD-10-CM | POA: Diagnosis present

## 2016-03-09 DIAGNOSIS — N289 Disorder of kidney and ureter, unspecified: Secondary | ICD-10-CM

## 2016-03-09 DIAGNOSIS — Z794 Long term (current) use of insulin: Secondary | ICD-10-CM

## 2016-03-09 DIAGNOSIS — D72829 Elevated white blood cell count, unspecified: Secondary | ICD-10-CM

## 2016-03-09 DIAGNOSIS — E876 Hypokalemia: Secondary | ICD-10-CM | POA: Diagnosis present

## 2016-03-09 DIAGNOSIS — E875 Hyperkalemia: Secondary | ICD-10-CM | POA: Diagnosis present

## 2016-03-09 DIAGNOSIS — E1010 Type 1 diabetes mellitus with ketoacidosis without coma: Principal | ICD-10-CM | POA: Diagnosis present

## 2016-03-09 LAB — COMPREHENSIVE METABOLIC PANEL
ALT: 27 U/L (ref 17–63)
ANION GAP: 28 — AB (ref 5–15)
AST: 27 U/L (ref 15–41)
Albumin: 4.6 g/dL (ref 3.5–5.0)
Alkaline Phosphatase: 63 U/L (ref 38–126)
BILIRUBIN TOTAL: 2.2 mg/dL — AB (ref 0.3–1.2)
BUN: 32 mg/dL — ABNORMAL HIGH (ref 6–20)
CO2: 7 mmol/L — ABNORMAL LOW (ref 22–32)
Calcium: 9.3 mg/dL (ref 8.9–10.3)
Chloride: 98 mmol/L — ABNORMAL LOW (ref 101–111)
Creatinine, Ser: 1.55 mg/dL — ABNORMAL HIGH (ref 0.61–1.24)
GFR calc Af Amer: >60 mL/min (ref >60)
GFR, EST NON AFRICAN AMERICAN: 58 mL/min — AB (ref >60)
GLUCOSE: 746 mg/dL — AB (ref 65–99)
POTASSIUM: 5.2 mmol/L — AB (ref 3.5–5.1)
Sodium: 133 mmol/L — ABNORMAL LOW (ref 135–145)
Total Protein: 7.9 g/dL (ref 6.5–8.1)

## 2016-03-09 LAB — BASIC METABOLIC PANEL
Anion gap: 20 — ABNORMAL HIGH (ref 5–15)
Anion gap: 12 (ref 5–15)
Anion gap: 4 — ABNORMAL LOW (ref 5–15)
BUN: 30 mg/dL — AB (ref 6–20)
BUN: 25 mg/dL — AB (ref 6–20)
BUN: 22 mg/dL — AB (ref 6–20)
CHLORIDE: 112 mmol/L — AB (ref 101–111)
CHLORIDE: 118 mmol/L — AB (ref 101–111)
CHLORIDE: 118 mmol/L — AB (ref 101–111)
CO2: 7 mmol/L — AB (ref 22–32)
CO2: 11 mmol/L — AB (ref 22–32)
CO2: 17 mmol/L — AB (ref 22–32)
CREATININE: 1.38 mg/dL — AB (ref 0.61–1.24)
CREATININE: 1.09 mg/dL (ref 0.61–1.24)
CREATININE: 0.8 mg/dL (ref 0.61–1.24)
Calcium: 8.3 mg/dL — ABNORMAL LOW (ref 8.9–10.3)
Calcium: 7.8 mg/dL — ABNORMAL LOW (ref 8.9–10.3)
Calcium: 7.6 mg/dL — ABNORMAL LOW (ref 8.9–10.3)
GFR calc Af Amer: >60 mL/min (ref >60)
GFR calc Af Amer: >60 mL/min (ref >60)
GFR calc Af Amer: >60 mL/min (ref >60)
GFR calc non Af Amer: >60 mL/min (ref >60)
GFR calc non Af Amer: >60 mL/min (ref >60)
GFR calc non Af Amer: >60 mL/min (ref >60)
GLUCOSE: 438 mg/dL — AB (ref 65–99)
GLUCOSE: 220 mg/dL — AB (ref 65–99)
Glucose, Bld: 130 mg/dL — ABNORMAL HIGH (ref 65–99)
POTASSIUM: 4.3 mmol/L (ref 3.5–5.1)
POTASSIUM: 3.6 mmol/L (ref 3.5–5.1)
POTASSIUM: 3.1 mmol/L — AB (ref 3.5–5.1)
SODIUM: 139 mmol/L (ref 135–145)
SODIUM: 141 mmol/L (ref 135–145)
SODIUM: 139 mmol/L (ref 135–145)

## 2016-03-09 LAB — BLOOD GAS, VENOUS
Acid-base deficit: 22.1 mmol/L — ABNORMAL HIGH (ref 0.0–2.0)
Bicarbonate: 7.3 mmol/L — ABNORMAL LOW (ref 20.0–28.0)
O2 Saturation: 73.2 %
PCO2 VEN: 25.6 mmHg — AB (ref 44.0–60.0)
PH VEN: 7.082 — AB (ref 7.250–7.430)
Patient temperature: 98.6
pO2, Ven: 48.6 mmHg — ABNORMAL HIGH (ref 32.0–45.0)

## 2016-03-09 LAB — GLUCOSE, CAPILLARY
GLUCOSE-CAPILLARY: 217 mg/dL — AB (ref 65–99)
GLUCOSE-CAPILLARY: 189 mg/dL — AB (ref 65–99)
GLUCOSE-CAPILLARY: 167 mg/dL — AB (ref 65–99)
GLUCOSE-CAPILLARY: 127 mg/dL — AB (ref 65–99)
Glucose-Capillary: 498 mg/dL — ABNORMAL HIGH (ref 65–99)
Glucose-Capillary: 367 mg/dL — ABNORMAL HIGH (ref 65–99)
Glucose-Capillary: 410 mg/dL — ABNORMAL HIGH (ref 65–99)
Glucose-Capillary: 278 mg/dL — ABNORMAL HIGH (ref 65–99)
Glucose-Capillary: 160 mg/dL — ABNORMAL HIGH (ref 65–99)
Glucose-Capillary: 126 mg/dL — ABNORMAL HIGH (ref 65–99)

## 2016-03-09 LAB — CBC WITH DIFFERENTIAL/PLATELET
BASOS ABS: 0 10*3/uL (ref 0.0–0.1)
Basophils Relative: 0 %
Eosinophils Absolute: 0 10*3/uL (ref 0.0–0.7)
Eosinophils Relative: 0 %
HEMATOCRIT: 42.3 % (ref 39.0–52.0)
Hemoglobin: 14.1 g/dL (ref 13.0–17.0)
LYMPHS ABS: 1.2 10*3/uL (ref 0.7–4.0)
Lymphocytes Relative: 9 %
MCH: 30.3 pg (ref 26.0–34.0)
MCHC: 33.3 g/dL (ref 30.0–36.0)
MCV: 91 fL (ref 78.0–100.0)
MONO ABS: 0.4 10*3/uL (ref 0.1–1.0)
MONOS PCT: 3 %
NEUTROS ABS: 12.2 10*3/uL — AB (ref 1.7–7.7)
Neutrophils Relative %: 88 %
PLATELETS: 328 10*3/uL (ref 150–400)
RBC: 4.65 MIL/uL (ref 4.22–5.81)
RDW: 13.2 % (ref 11.5–15.5)
WBC: 13.8 10*3/uL — ABNORMAL HIGH (ref 4.0–10.5)

## 2016-03-09 LAB — URINE MICROSCOPIC-ADD ON: RBC / HPF: NONE SEEN RBC/hpf (ref 0–5)

## 2016-03-09 LAB — URINALYSIS, ROUTINE W REFLEX MICROSCOPIC
BILIRUBIN URINE: NEGATIVE
Glucose, UA: >1000 mg/dL — AB
Hgb urine dipstick: NEGATIVE
LEUKOCYTES UA: NEGATIVE
NITRITE: NEGATIVE
PROTEIN: NEGATIVE mg/dL
Specific Gravity, Urine: 1.025 (ref 1.005–1.030)
pH: 5.5 (ref 5.0–8.0)

## 2016-03-09 LAB — I-STAT CHEM 8, ED
BUN: 29 mg/dL — ABNORMAL HIGH (ref 6–20)
CALCIUM ION: 1.16 mmol/L (ref 1.15–1.40)
Chloride: 103 mmol/L (ref 101–111)
Creatinine, Ser: 1 mg/dL (ref 0.61–1.24)
Glucose, Bld: 674 mg/dL (ref 65–99)
HEMATOCRIT: 46 % (ref 39.0–52.0)
HEMOGLOBIN: 15.6 g/dL (ref 13.0–17.0)
Potassium: 5.1 mmol/L (ref 3.5–5.1)
SODIUM: 133 mmol/L — AB (ref 135–145)
TCO2: 10 mmol/L (ref 0–100)

## 2016-03-09 LAB — CBG MONITORING, ED
Glucose-Capillary: >600 mg/dL (ref 65–99)
Glucose-Capillary: >600 mg/dL (ref 65–99)

## 2016-03-09 LAB — MRSA PCR SCREENING: MRSA by PCR: NEGATIVE

## 2016-03-09 LAB — BETA-HYDROXYBUTYRIC ACID: Beta-Hydroxybutyric Acid: >8 mmol/L — ABNORMAL HIGH (ref 0.05–0.27)

## 2016-03-09 MED ORDER — ONDANSETRON HCL 4 MG/2ML IJ SOLN
4.0000 mg | Freq: Once | INTRAMUSCULAR | Status: AC
  Administered 2016-03-09: 4 mg via INTRAVENOUS
  Filled 2016-03-09: qty 2

## 2016-03-09 MED ORDER — SODIUM CHLORIDE 0.9 % IV SOLN
INTRAVENOUS | Status: DC
  Administered 2016-03-09 (×2): via INTRAVENOUS

## 2016-03-09 MED ORDER — POTASSIUM CHLORIDE 10 MEQ/100ML IV SOLN
10.0000 meq | INTRAVENOUS | Status: AC
  Administered 2016-03-09 – 2016-03-10 (×3): 10 meq via INTRAVENOUS
  Filled 2016-03-09 (×3): qty 100

## 2016-03-09 MED ORDER — ENOXAPARIN SODIUM 40 MG/0.4ML ~~LOC~~ SOLN
40.0000 mg | SUBCUTANEOUS | Status: DC
  Administered 2016-03-09: 40 mg via SUBCUTANEOUS
  Filled 2016-03-09 (×2): qty 0.4

## 2016-03-09 MED ORDER — SODIUM CHLORIDE 0.9 % IV SOLN
INTRAVENOUS | Status: DC
  Administered 2016-03-09: 5.4 [IU]/h via INTRAVENOUS
  Filled 2016-03-09: qty 2.5

## 2016-03-09 MED ORDER — SODIUM CHLORIDE 0.9 % IV BOLUS (SEPSIS)
1000.0000 mL | Freq: Once | INTRAVENOUS | Status: AC
  Administered 2016-03-09: 1000 mL via INTRAVENOUS

## 2016-03-09 MED ORDER — DEXTROSE-NACL 5-0.45 % IV SOLN
INTRAVENOUS | Status: DC
  Administered 2016-03-09: 20:00:00 via INTRAVENOUS

## 2016-03-09 MED ORDER — INSULIN GLARGINE 100 UNIT/ML ~~LOC~~ SOLN
10.0000 [IU] | SUBCUTANEOUS | Status: AC
  Administered 2016-03-09: 10 [IU] via SUBCUTANEOUS
  Filled 2016-03-09: qty 0.1

## 2016-03-09 MED ORDER — INSULIN REGULAR HUMAN 100 UNIT/ML IJ SOLN: INTRAMUSCULAR | Status: DC

## 2016-03-09 MED ORDER — DEXTROSE-NACL 5-0.45 % IV SOLN
INTRAVENOUS | Status: DC
  Administered 2016-03-10 (×2): via INTRAVENOUS

## 2016-03-09 MED ORDER — SODIUM CHLORIDE 0.9 % IV SOLN: INTRAVENOUS | Status: DC

## 2016-03-09 NOTE — ED Provider Notes (Signed)
[facility] DEPT Provider Note   CSN: [id number] Arrival date & time: 03/09/16  0904     History   Chief Complaint Chief Complaint  Patient presents with  . Emesis    HPI [name] [name] is a 30 y.o. male.  HPI  30 year old male who presents with n/v starting this morning. History of poorly controlled type 1 DM c/b DKA in the past and renal insufficiency. States 1-2 days of not feeling well, decreased appetite. Taking less of insulin due to decreased appetite. Mild dyspnea, no chest pain. No abdominal pain, cough, URI or PNA symptoms, diarrhea. Feels similar to prior DKA episodes    Past Medical History:  Diagnosis Date  . Diabetes mellitus without complication ([place]) Dx 8466  . Renal insufficiency 2011   pt was on dialysis for a short time approx 2011, pt is no longer on dialysis    Patient Active Problem List   Diagnosis Date Noted  . DKA, type 1, not at goal [facility]) 12/02/2015  . DKA (diabetic ketoacidoses) ([place]) 12/01/2015  . Marijuana use 07/22/2015  . Proteinuria with type 1 diabetes mellitus ([place]) 07/22/2015  . High blood pressure 07/22/2015  . Uncontrolled type 1 diabetes mellitus with ketoacidosis without coma ([place])   . Hypocalcemia   . Acute renal failure ([place])   . Hypomagnesemia   . Abnormal EKG 06/15/2015  . Sinus tachycardia   . Tachypnea   . Tinea pedis 12/17/2014  . Adjustment disorder with mixed anxiety and depressed mood 09/03/2014  . Protein-calorie malnutrition, severe ([place]) 07/06/2014  . DM (diabetes mellitus), type 1, uncontrolled ([place]) 05/01/2014  . HLD (hyperlipidemia) 04/26/2014  . Substance abuse   . Fatigue 03/10/2012    History reviewed. No pertinent surgical history.     Home Medications    Prior to Admission medications   Medication Sig Start Date End Date Taking? Authorizing Provider  insulin aspart (NOVOLOG) 100 UNIT/ML FlexPen 15 U three times daily with meals and 5 U up to twice daily with snacks Patient taking differently:  Inject 15 Units into the skin 3 (three) times daily. 15 U three times daily with meals 02/07/16  Yes Josalyn Funches, MD  Insulin Glargine (LANTUS) 100 UNIT/ML Solostar Pen Inject 45 Units into the skin daily at 10 pm. 02/07/16  Yes Josalyn Funches, MD  Blood Glucose Monitoring Suppl (TRUE METRIX METER) w/Device KIT 1 each by Does not apply route as needed. 06/28/15   [name], MD  butalbital-acetaminophen-caffeine (FIORICET) 50-325-40 MG tablet Take 1-2 tablets by mouth every 6 (six) hours as needed for headache. Patient not taking: Reported on 03/09/2016 01/26/16 01/25/17  Olivia Canter Sam, PA-C  glucose blood (TRUE METRIX BLOOD GLUCOSE TEST) test strip 1 each by Other route 3 (three) times daily. 06/28/15   Josalyn Funches, MD  Insulin Syringe-Needle U-100 (B-D INS SYRINGE 0.5CC/31GX5/16) 31G X 5/16" 0.5 ML MISC 1 each by Does not apply route 2 (two) times daily. 06/18/15   Nishant Dhungel, MD  pantoprazole (PROTONIX) 40 MG tablet Take 1 tablet (40 mg total) by mouth daily. Patient not taking: Reported on 03/09/2016 12/03/15   Modena Jansky, MD  TRUEPLUS LANCETS 28G MISC 1 each by Does not apply route 3 (three) times daily. 06/18/15   Nishant Dhungel, MD    Family History Family History  Problem Relation Age of Onset  . Diabetes Father   . Diabetes Maternal Grandmother   . Heart disease Neg Hx   . Hypertension Neg Hx  Social History Social History  Substance Use Topics  . Smoking status: Former Smoker    Quit date: 09/19/2013  . Smokeless tobacco: Never Used  . Alcohol use No     Allergies   Review of patient's allergies indicates no known allergies.   Review of Systems Review of Systems 10/14 systems reviewed and are negative other than those stated in the HPI   Physical Exam Updated Vital Signs BP 119/63 (BP Location: Right Arm)   Pulse (!) 124   Temp 98 F (36.7 C) (Oral)   Resp 17   SpO2 100%   Physical Exam Physical Exam  Nursing note and vitals  reviewed. Constitutional: non-toxic, and in no acute distress Head: Normocephalic and atraumatic.  Mouth/Throat: Oropharynx is clear and dry mucous membranes.  Neck: Normal range of motion. Neck supple.  Cardiovascular: Tachycardic rate and regular rhythm.   Pulmonary/Chest: Effort normal and breath sounds normal.  Abdominal: Soft. There is no tenderness. There is no rebound and no guarding.  Musculoskeletal: Normal range of motion.  Neurological: Alert, no facial droop, fluent speech, moves all extremities symmetrically Skin: Skin is warm and dry.  Psychiatric: Cooperative   ED Treatments / Results  Labs (all labs ordered are listed, but only abnormal results are displayed) Labs Reviewed  CBC WITH DIFFERENTIAL/PLATELET - Abnormal; Notable for the following:       Result Value   WBC 13.8 (*)    Neutro Abs 12.2 (*)    All other components within normal limits  COMPREHENSIVE METABOLIC PANEL - Abnormal; Notable for the following:    Sodium 133 (*)    Potassium 5.2 (*)    Chloride 98 (*)    CO2 7 (*)    Glucose, Bld 746 (*)    BUN 32 (*)    Creatinine, Ser 1.55 (*)    Total Bilirubin 2.2 (*)    GFR calc non Af Amer 58 (*)    Anion gap 28 (*)    All other components within normal limits  BLOOD GAS, VENOUS - Abnormal; Notable for the following:    pH, Ven 7.082 (*)    pCO2, Ven 25.6 (*)    pO2, Ven 48.6 (*)    Bicarbonate 7.3 (*)    Acid-base deficit 22.1 (*)    All other components within normal limits  URINALYSIS, ROUTINE W REFLEX MICROSCOPIC (NOT AT [facility]) - Abnormal; Notable for the following:    Glucose, UA >1000 (*)    Ketones, ur >80 (*)    All other components within normal limits  URINE MICROSCOPIC-ADD ON - Abnormal; Notable for the following:    Squamous Epithelial / LPF 0-5 (*)    Bacteria, UA FEW (*)    Casts HYALINE CASTS (*)    All other components within normal limits  CBG MONITORING, ED - Abnormal; Notable for the following:    Glucose-Capillary >600 (*)     All other components within normal limits  I-STAT CHEM 8, ED - Abnormal; Notable for the following:    Sodium 133 (*)    BUN 29 (*)    Glucose, Bld 674 (*)    All other components within normal limits  BETA-HYDROXYBUTYRIC ACID    EKG  EKG Interpretation  Date/Time:  Thursday March 09 2016 10:01:12 EDT Ventricular Rate:  126 PR Interval:    QRS Duration: 113 QT Interval:  298 QTC Calculation: 432 R Axis:   92 Text Interpretation:  Right and left arm electrode reversal, interpretation assumes no reversal  Sinus tachycardia Probable anteroseptal infarct, old ST elevation suggests acute pericarditis Baseline wander in lead(s) I II aVR V5 No acute changes Confirmed by Joslynn Jamroz MD, Itha Kroeker ([id number]) on 03/09/2016 10:41:11 AM       Radiology No results found.  Procedures Procedures (including critical care time) CRITICAL CARE Performed by: Forde Dandy   Total critical care time: 45  minutes  Critical care time was exclusive of separately billable procedures and treating other patients.  Critical care was necessary to treat or prevent imminent or life-threatening deterioration.  Critical care was time spent personally by me on the following activities: development of treatment plan with patient and/or surrogate as well as nursing, discussions with consultants, evaluation of patient's response to treatment, examination of patient, obtaining history from patient or surrogate, ordering and performing treatments and interventions, ordering and review of laboratory studies, ordering and review of radiographic studies, pulse oximetry and re-evaluation of patient's condition.  Medications Ordered in ED Medications  0.9 %  sodium chloride infusion (not administered)  insulin regular (NOVOLIN R,HUMULIN R) 250 Units in sodium chloride 0.9 % 250 mL (1 Units/mL) infusion (not administered)  dextrose 5 %-0.45 % sodium chloride infusion (not administered)  sodium chloride 0.9 % bolus 1,000 mL  (1,000 mLs Intravenous New Bag/Given 03/09/16 1016)  sodium chloride 0.9 % bolus 1,000 mL (1,000 mLs Intravenous New Bag/Given 03/09/16 1016)  ondansetron (ZOFRAN) injection 4 mg (4 mg Intravenous Given 03/09/16 1109)     Initial Impression / Assessment and Plan / ED Course  I have reviewed the triage vital signs and the nursing notes.  Pertinent labs & imaging results that were available during my care of the patient were reviewed by me and considered in my medical decision making (see chart for details).  Clinical Course    Presenting with DKA. He is acidotic with pH of 7.08, bicarbonate of 7.3, and glucose in the 700s. He is tachycardic, but normotensive. Mentating well. Benign abdomen. He  given 2 L of IV fluids, and plan to initiate insulin drip afterwards with continuous IVF @ 150 cc/hr. Discussed with Dr. Wynelle Cleveland. Admitted to stepdown.   Final Clinical Impressions(s) / ED Diagnoses   Final diagnoses:  Diabetic ketoacidosis without coma associated with type 1 diabetes mellitus ([place])  Non-intractable vomiting with nausea, unspecified vomiting type    New Prescriptions New Prescriptions   No medications on file     Forde Dandy, MD 03/09/16 1131

## 2016-03-09 NOTE — H&P (Signed)
History and Physical    [name] [name] MRN:[id number] DOB: 03/16/1986 DOA: 03/09/2016    PCP: FUNCHES, JOSALYN C, MD  Patient coming from: home  Chief Complaint: weak  HPI: [name] [name] is a 30 y.o. male with medical history significant of DM1 uncontrolled who as admitted in July with DKA and left AMA. He presents with 2-3 days of weakness and poor appetite. He vomitng this morning after having some water and a fruit cup. He tool 18 U of Lantus last night and a dose of Novolog this AM. No abdominal pain or diarrhea. No fever chills, cold like symptoms, dysuria, rash, ulcers.    ED Course:  Glucose 746, B hydroxybutyric acid > 8.00, PH 7.082, Sodium 133, K 5.2>> 5.1, Cr 1.55 >> 1.00 WBC 13.8 HR in 120-130s  Review of Systems:  All other systems reviewed and apart from HPI, are negative.  Past Medical History:  Diagnosis Date  . Diabetes mellitus without complication ([facility]) Dx 2012  . Renal insufficiency 2011   pt was on dialysis for a short time approx 2011, pt is no longer on dialysis    History reviewed. No pertinent surgical history.  Social History:   reports that he quit smoking about 2 years ago. He has never used smokeless tobacco. He reports that he does not drink alcohol or use drugs.  No Known Allergies  Family History  Problem Relation Age of Onset  . Diabetes Father   . Diabetes Maternal Grandmother   . Heart disease Neg Hx   . Hypertension Neg Hx      Prior to Admission medications   Medication Sig Start Date End Date Taking? Authorizing Provider  insulin aspart (NOVOLOG) 100 UNIT/ML FlexPen 15 U three times daily with meals and 5 U up to twice daily with snacks Patient taking differently: Inject 15 Units into the skin 3 (three) times daily. 15 U three times daily with meals 02/07/16  Yes Josalyn Funches, MD  Insulin Glargine (LANTUS) 100 UNIT/ML Solostar Pen Inject 45 Units into the skin daily at 10 pm. 02/07/16  Yes Josalyn Funches, MD  Blood Glucose  Monitoring Suppl (TRUE METRIX METER) w/Device KIT 1 each by Does not apply route as needed. 06/28/15   Josalyn Funches, MD  butalbital-acetaminophen-caffeine (FIORICET) 50-325-40 MG tablet Take 1-2 tablets by mouth every 6 (six) hours as needed for headache. Patient not taking: Reported on 03/09/2016 01/26/16 01/25/17  Serena Y Sam, PA-C  glucose blood (TRUE METRIX BLOOD GLUCOSE TEST) test strip 1 each by Other route 3 (three) times daily. 06/28/15   Josalyn Funches, MD  Insulin Syringe-Needle U-100 (B-D INS SYRINGE 0.5CC/31GX5/16) 31G X 5/16" 0.5 ML MISC 1 each by Does not apply route 2 (two) times daily. 06/18/15   Nishant Dhungel, MD  pantoprazole (PROTONIX) 40 MG tablet Take 1 tablet (40 mg total) by mouth daily. Patient not taking: Reported on 03/09/2016 12/03/15   Anand D Hongalgi, MD  TRUEPLUS LANCETS 28G MISC 1 each by Does not apply route 3 (three) times daily. 06/18/15   Nishant Dhungel, MD    Physical Exam: Vitals:   03/09/16 0918  BP: 119/63  Pulse: (!) 124  Resp: 17  Temp: 98 F (36.7 C)  TempSrc: Oral  SpO2: 100%      Constitutional: NAD, calm, comfortable Eyes: PERTLA, lids and conjunctivae normal ENMT: Mucous membranes are dry. Posterior pharynx clear of any exudate or lesions. Normal dentition.  Neck: normal, supple, no masses, no thyromegaly Respiratory: clear to auscultation bilaterally, no wheezing,   no crackles. Normal respiratory effort. No accessory muscle use.  Cardiovascular: S1 & S2 heard, regular rate and rhythm, no murmurs / rubs / gallops. No extremity edema. 2+ pedal pulses. No carotid bruits. + tachycardia Abdomen: No distension, no tenderness, no masses palpated. No hepatosplenomegaly. Bowel sounds normal.  Musculoskeletal: no clubbing / cyanosis. No joint deformity upper and lower extremities. Good ROM, no contractures. Normal muscle tone.  Skin: no rashes, lesions, ulcers. No induration Neurologic: CN 2-12 grossly intact. Sensation intact, DTR normal. Strength  5/5 in all 4 limbs.  Psychiatric: Normal judgment and insight. Alert and oriented x 3. Normal mood.     Labs on Admission: I have personally reviewed following labs and imaging studies  CBC:  Recent Labs Lab 03/09/16 1030 03/09/16 1042  WBC 13.8*  --   NEUTROABS 12.2*  --   HGB 14.1 15.6  HCT 42.3 46.0  MCV 91.0  --   PLT 328  --    Basic Metabolic Panel:  Recent Labs Lab 03/09/16 1030 03/09/16 1042  NA 133* 133*  K 5.2* 5.1  CL 98* 103  CO2 7*  --   GLUCOSE 746* 674*  BUN 32* 29*  CREATININE 1.55* 1.00  CALCIUM 9.3  --    GFR: CrCl cannot be calculated (Unknown ideal weight.). Liver Function Tests:  Recent Labs Lab 03/09/16 1030  AST 27  ALT 27  ALKPHOS 63  BILITOT 2.2*  PROT 7.9  ALBUMIN 4.6   No results for input(s): LIPASE, AMYLASE in the last 168 hours. No results for input(s): AMMONIA in the last 168 hours. Coagulation Profile: No results for input(s): INR, PROTIME in the last 168 hours. Cardiac Enzymes: No results for input(s): CKTOTAL, CKMB, CKMBINDEX, TROPONINI in the last 168 hours. BNP (last 3 results) No results for input(s): PROBNP in the last 8760 hours. HbA1C: No results for input(s): HGBA1C in the last 72 hours. CBG:  Recent Labs Lab 03/09/16 0931  GLUCAP >600*   Lipid Profile: No results for input(s): CHOL, HDL, LDLCALC, TRIG, CHOLHDL, LDLDIRECT in the last 72 hours. Thyroid Function Tests: No results for input(s): TSH, T4TOTAL, FREET4, T3FREE, THYROIDAB in the last 72 hours. Anemia Panel: No results for input(s): VITAMINB12, FOLATE, FERRITIN, TIBC, IRON, RETICCTPCT in the last 72 hours. Urine analysis:    Component Value Date/Time   COLORURINE YELLOW 03/09/2016 1039   APPEARANCEUR CLEAR 03/09/2016 1039   LABSPEC 1.025 03/09/2016 1039   PHURINE 5.5 03/09/2016 1039   GLUCOSEU >1000 (A) 03/09/2016 1039   HGBUR NEGATIVE 03/09/2016 1039   BILIRUBINUR NEGATIVE 03/09/2016 1039   BILIRUBINUR Negative 02/07/2016 1528    KETONESUR >80 (A) 03/09/2016 1039   PROTEINUR NEGATIVE 03/09/2016 1039   UROBILINOGEN 0.2 02/07/2016 1528   UROBILINOGEN 0.2 03/22/2015 0913   NITRITE NEGATIVE 03/09/2016 1039   LEUKOCYTESUR NEGATIVE 03/09/2016 1039   Sepsis Labs: @LABRCNTIP(procalcitonin:4,lacticidven:4) )No results found for this or any previous visit (from the past 240 hour(s)).   Radiological Exams on Admission: No results found.  EKG: Independently reviewed. Sinus tachy at 126 bpm  Assessment/Plan Principal Problem:   DKA, type 1, not at goal  - lantus 10 U now, Insulin infusion, IVF, NPO except ice chips - has received 2 L IVF in ER- continuous fluids at 125 cc/hr - A1c- last A1c 13.9 in 7/ 17  Active Problems:     Sinus tachycardia - should resolve with treatment of above    Hyperkalemia - has improved slightly follow     Renal insufficiency - C   1.55  And then 1.00 in ER    Leukocytosis - likely stress response    DVT prophylaxis: Lovenox  Code Status: full code  Family Communication:   Disposition Plan: admit to SDU  Consults called: none  Admission status: admit    [facility] MD [facility] Pager: [email or web address] Password [email or web address] 7PM-7AM, please contact night-coverage   03/09/2016, 11:34 AM

## 2016-03-09 NOTE — ED Notes (Signed)
SDU called and says to go ahead and bring pt to RM 1230

## 2016-03-09 NOTE — ED Triage Notes (Signed)
Patient reports a hx of diabetes and hx of DKA. Patient reports feeling unwell for last 1-2 days. Started vomiting this morning. Patient takes insulin but not taking his normal dose of insulin due to lack of appetite.

## 2016-03-10 DIAGNOSIS — E1010 Type 1 diabetes mellitus with ketoacidosis without coma: Principal | ICD-10-CM

## 2016-03-10 LAB — GLUCOSE, CAPILLARY
GLUCOSE-CAPILLARY: 112 mg/dL — AB (ref 65–99)
GLUCOSE-CAPILLARY: 124 mg/dL — AB (ref 65–99)
GLUCOSE-CAPILLARY: 140 mg/dL — AB (ref 65–99)
GLUCOSE-CAPILLARY: 162 mg/dL — AB (ref 65–99)
GLUCOSE-CAPILLARY: 181 mg/dL — AB (ref 65–99)
GLUCOSE-CAPILLARY: 274 mg/dL — AB (ref 65–99)
Glucose-Capillary: 161 mg/dL — ABNORMAL HIGH (ref 65–99)
Glucose-Capillary: 131 mg/dL — ABNORMAL HIGH (ref 65–99)
Glucose-Capillary: 170 mg/dL — ABNORMAL HIGH (ref 65–99)
Glucose-Capillary: 149 mg/dL — ABNORMAL HIGH (ref 65–99)
Glucose-Capillary: 150 mg/dL — ABNORMAL HIGH (ref 65–99)
Glucose-Capillary: 164 mg/dL — ABNORMAL HIGH (ref 65–99)
Glucose-Capillary: 221 mg/dL — ABNORMAL HIGH (ref 65–99)
Glucose-Capillary: 187 mg/dL — ABNORMAL HIGH (ref 65–99)

## 2016-03-10 LAB — BASIC METABOLIC PANEL
Anion gap: 5 (ref 5–15)
Anion gap: 5 (ref 5–15)
Anion gap: 7 (ref 5–15)
Anion gap: 4 — ABNORMAL LOW (ref 5–15)
Anion gap: 4 — ABNORMAL LOW (ref 5–15)
BUN: 22 mg/dL — ABNORMAL HIGH (ref 6–20)
BUN: 19 mg/dL (ref 6–20)
BUN: 18 mg/dL (ref 6–20)
BUN: 16 mg/dL (ref 6–20)
BUN: 14 mg/dL (ref 6–20)
CHLORIDE: 115 mmol/L — AB (ref 101–111)
CHLORIDE: 110 mmol/L (ref 101–111)
CO2: 17 mmol/L — ABNORMAL LOW (ref 22–32)
CO2: 20 mmol/L — ABNORMAL LOW (ref 22–32)
CO2: 19 mmol/L — ABNORMAL LOW (ref 22–32)
CO2: 23 mmol/L (ref 22–32)
CO2: 23 mmol/L (ref 22–32)
CREATININE: 0.59 mg/dL — AB (ref 0.61–1.24)
CREATININE: 0.56 mg/dL — AB (ref 0.61–1.24)
Calcium: 7.5 mg/dL — ABNORMAL LOW (ref 8.9–10.3)
Calcium: 7.6 mg/dL — ABNORMAL LOW (ref 8.9–10.3)
Calcium: 7.7 mg/dL — ABNORMAL LOW (ref 8.9–10.3)
Calcium: 7.7 mg/dL — ABNORMAL LOW (ref 8.9–10.3)
Calcium: 7.9 mg/dL — ABNORMAL LOW (ref 8.9–10.3)
Chloride: 113 mmol/L — ABNORMAL HIGH (ref 101–111)
Chloride: 107 mmol/L (ref 101–111)
Chloride: 108 mmol/L (ref 101–111)
Creatinine, Ser: 0.68 mg/dL (ref 0.61–1.24)
Creatinine, Ser: 0.65 mg/dL (ref 0.61–1.24)
Creatinine, Ser: 0.58 mg/dL — ABNORMAL LOW (ref 0.61–1.24)
GFR calc Af Amer: >60 mL/min (ref >60)
GFR calc non Af Amer: >60 mL/min (ref >60)
GFR calc non Af Amer: >60 mL/min (ref >60)
Glucose, Bld: 129 mg/dL — ABNORMAL HIGH (ref 65–99)
Glucose, Bld: 162 mg/dL — ABNORMAL HIGH (ref 65–99)
Glucose, Bld: 183 mg/dL — ABNORMAL HIGH (ref 65–99)
Glucose, Bld: 210 mg/dL — ABNORMAL HIGH (ref 65–99)
Glucose, Bld: 200 mg/dL — ABNORMAL HIGH (ref 65–99)
POTASSIUM: 3.8 mmol/L (ref 3.5–5.1)
POTASSIUM: 3.2 mmol/L — AB (ref 3.5–5.1)
POTASSIUM: 3 mmol/L — AB (ref 3.5–5.1)
Potassium: 3.3 mmol/L — ABNORMAL LOW (ref 3.5–5.1)
Potassium: 3.2 mmol/L — ABNORMAL LOW (ref 3.5–5.1)
SODIUM: 137 mmol/L (ref 135–145)
SODIUM: 136 mmol/L (ref 135–145)
SODIUM: 134 mmol/L — AB (ref 135–145)
SODIUM: 135 mmol/L (ref 135–145)
Sodium: 138 mmol/L (ref 135–145)

## 2016-03-10 MED ORDER — INSULIN ASPART 100 UNIT/ML ~~LOC~~ SOLN: 0.0000 [IU] | Freq: Every day | SUBCUTANEOUS | Status: DC

## 2016-03-10 MED ORDER — INSULIN ASPART 100 UNIT/ML ~~LOC~~ SOLN
0.0000 [IU] | Freq: Three times a day (TID) | SUBCUTANEOUS | Status: DC
  Administered 2016-03-10: 2 [IU] via SUBCUTANEOUS
  Administered 2016-03-11: 3 [IU] via SUBCUTANEOUS

## 2016-03-10 MED ORDER — SODIUM CHLORIDE 0.9 % IV SOLN
INTRAVENOUS | Status: AC
  Administered 2016-03-10 – 2016-03-11 (×2): via INTRAVENOUS

## 2016-03-10 MED ORDER — POTASSIUM CHLORIDE CRYS ER 20 MEQ PO TBCR
40.0000 meq | EXTENDED_RELEASE_TABLET | ORAL | Status: AC
  Administered 2016-03-10 (×2): 40 meq via ORAL
  Filled 2016-03-10 (×2): qty 2

## 2016-03-10 MED ORDER — INSULIN GLARGINE 100 UNIT/ML ~~LOC~~ SOLN
30.0000 [IU] | Freq: Two times a day (BID) | SUBCUTANEOUS | Status: DC
  Administered 2016-03-10 – 2016-03-11 (×3): 30 [IU] via SUBCUTANEOUS
  Filled 2016-03-10 (×4): qty 0.3

## 2016-03-10 NOTE — Progress Notes (Signed)
[facility] notified of pt Insulin gtt less that 1, orders to increase D5NS to 150cc/hr

## 2016-03-10 NOTE — Progress Notes (Addendum)
Inpatient Diabetes Program Recommendations  AACE/ADA: New Consensus Statement on Inpatient Glycemic Control (2015)  Target Ranges:  Prepandial:   less than 140 mg/dL      Peak postprandial:   less than 180 mg/dL (1-2 hours)      Critically ill patients:  140 - 180 mg/dL   Results for [name] [name], [name] [name] (MRN [id number]) as of 03/10/2016 07:51  Ref. Range 03/09/2016 10:30  Sodium Latest Ref Range: 135 - 145 mmol/L 133 (L)  Potassium Latest Ref Range: 3.5 - 5.1 mmol/L 5.2 (H)  Chloride Latest Ref Range: 101 - 111 mmol/L 98 (L)  CO2 Latest Ref Range: 22 - 32 mmol/L 7 (L)  BUN Latest Ref Range: 6 - 20 mg/dL 32 (H)  Creatinine Latest Ref Range: 0.61 - 1.24 mg/dL 1.55 (H)  Calcium Latest Ref Range: 8.9 - 10.3 mg/dL 9.3  EGFR (Non-African Amer.) Latest Ref Range: >60 mL/min 58 (L)  EGFR (African American) Latest Ref Range: >60 mL/min >60  Glucose Latest Ref Range: 65 - 99 mg/dL 746 (HH)  Anion gap Latest Ref Range: 5 - 15  28 (H)    Admit with: DKA  History: DM  Home DM Meds: Lantus 45 units QHS       Novolog 15 units TIDWC       Novolog 5 units with large snacks  Current Insulin Orders: IV Insulin drip     -BMET much improved this AM.  CO2 up to 20 and Anion Gap down to 5.  -Patient well known to the Inpatient Diabetes Program.  Has been counseled extensively on previous admissions regarding his Diabetes home care.  This is patient's 6th admission in 2017 for DKA!  -Per Chart Review, note patient saw his PCP at the [facility] on 02/07/16.  At that visit, patient was instructed to increase his Lantus to 45 units QHS and to also increase his Novolog doses to 15 units TIDWC.  Patient also to take Novolog 5 units with large snacks.     MD- When patient is ready to transition to SQ insulin, please make sure he receives his home dose of Lantus at least 1 hour before IV Insulin drip is stopped.  Please also order Novolog Sensitive Correction Scale/ SSI (0-9  units) TID AC + HS  Please also order a portion of pt's home dose of Novolog Meal Coverage: Could start with Novolog 8 units TIDWC (50% home dose)      --Will follow patient during hospitalization--  Wyn Quaker RN, MSN, CDE Diabetes Coordinator Inpatient Glycemic Control Team Team Pager: [phone] (8a-5p)

## 2016-03-10 NOTE — Progress Notes (Signed)
PROGRESS NOTE  [name] [name] ZOX:[id number] DOB: Jul 12, 1985 DOA: 03/09/2016 PCP: [name], MD  HPI/Recap of past 24 hours:  Feeling better, patient admits diet and meds noncompliance leads to DKA , denies chest pain, no n/v, no abdominal pain, no fever, no cough, no recent illness  Assessment/Plan: Principal Problem:   DKA, type 1, not at goal [facility]) Active Problems:   DM (diabetes mellitus), type 1, uncontrolled (HCC)   Sinus tachycardia   Hyperkalemia   Renal insufficiency   Leukocytosis   Diabetic ketoacidosis without coma associated with type 1 diabetes mellitus (HCC)   Non-intractable vomiting with nausea   Recurrent DKA: likely secondary to meds and diet noncompliance. He was started on inuslin drip and  admitted to stepdown, now better, transitioned to subQ insulin, restarted diet, transfer to med surg.  Aki: resolved with fluids. ua concentrated, no infection.  Hypokalemia: replace k, check mag  Leukocytosis/sinu tachycardia: likely from dehydration and stress, resolved.  Type 1 diabetes: a1c range from 13.9 to 16.2 in the last year. Diabetic RN input appreciated,   Body mass index is 19.53 kg/m.   Code Status: full  Family Communication: patient   Disposition Plan: anticipate d/c in am   Consultants:  Diabetic RN  Procedures:  Insulin drip  Antibiotics:  none   Objective: BP 109/71   Pulse (!) 102   Temp 98.5 F (36.9 C) (Oral)   Resp 14   Ht 5\' 6"  (1.676 m)   Wt 54.9 kg (121 lb)   SpO2 97%   BMI 19.53 kg/m   Intake/Output Summary (Last 24 hours) at 03/10/16 0801 Last data filed at 03/10/16 0600  Gross per 24 hour  Intake          2798.42 ml  Output             1700 ml  Net          1098.42 ml   Filed Weights   03/09/16 1208  Weight: 54.9 kg (121 lb)    Exam:   General:  NAD, thin  Cardiovascular: RRR  Respiratory: CTABL  Abdomen: Soft/ND/NT, positive BS  Musculoskeletal: No Edema  Neuro: aaox3  Data  Reviewed: Basic Metabolic Panel:  Recent Labs Lab 03/09/16 1416 03/09/16 1747 03/09/16 2152 03/10/16 0209 03/10/16 0656  NA 139 141 139 137 138  K 4.3 3.6 3.1* 3.8 3.3*  CL 112* 118* 118* 115* 113*  CO2 7* 11* 17* 17* 20*  GLUCOSE 438* 220* 130* 129* 162*  BUN 30* 25* 22* 22* 19  CREATININE 1.38* 1.09 0.80 0.68 0.65  CALCIUM 8.3* 7.8* 7.6* 7.5* 7.6*   Liver Function Tests:  Recent Labs Lab 03/09/16 1030  AST 27  ALT 27  ALKPHOS 63  BILITOT 2.2*  PROT 7.9  ALBUMIN 4.6   No results for input(s): LIPASE, AMYLASE in the last 168 hours. No results for input(s): AMMONIA in the last 168 hours. CBC:  Recent Labs Lab 03/09/16 1030 03/09/16 1042  WBC 13.8*  --   NEUTROABS 12.2*  --   HGB 14.1 15.6  HCT 42.3 46.0  MCV 91.0  --   PLT 328  --    Cardiac Enzymes:   No results for input(s): CKTOTAL, CKMB, CKMBINDEX, TROPONINI in the last 168 hours. BNP (last 3 results) No results for input(s): BNP in the last 8760 hours.  ProBNP (last 3 results) No results for input(s): PROBNP in the last 8760 hours.  CBG:  Recent Labs Lab 03/10/16 0305 03/10/16  [id number] 03/10/16 0516 03/10/16 0620 03/10/16 0736  GLUCAP 131* 170* 149* 150* 162*    Recent Results (from the past 240 hour(s))  MRSA PCR Screening     Status: None   Collection Time: 03/09/16  1:54 PM  Result Value Ref Range Status   MRSA by PCR NEGATIVE NEGATIVE Final    Comment:        The GeneXpert MRSA Assay (FDA approved for NASAL specimens only), is one component of a comprehensive MRSA colonization surveillance program. It is not intended to diagnose MRSA infection nor to guide or monitor treatment for MRSA infections.      Studies: No results found.  Scheduled Meds: . enoxaparin (LOVENOX) injection  40 mg Subcutaneous Q24H    Continuous Infusions: . sodium chloride Stopped (03/09/16 1946)  . sodium chloride    . dextrose 5 % and 0.45% NaCl 175 mL/hr at 03/10/16 0600  . insulin (NOVOLIN-R)  infusion 0.5 Units/hr (03/10/16 0737)     Time spent: 25mins  Kincaid Tiger MD, PhD  [facility] Pager [phone]. If 7PM-7AM, please contact night-coverage at [email or web address], password [email or web address] 03/10/2016, 8:01 AM  LOS: 1 day

## 2016-03-10 NOTE — Progress Notes (Signed)
Schorr NP notified of Glucose Stabilizer showing to change insulin gtt to 0 cc/hr, per NP increase D5 1/2 NS to 175 and restart insulin gtt based on Glucose Stabilizer at next hour.

## 2016-03-11 LAB — BASIC METABOLIC PANEL
ANION GAP: 5 (ref 5–15)
BUN: 10 mg/dL (ref 6–20)
CHLORIDE: 113 mmol/L — AB (ref 101–111)
CO2: 23 mmol/L (ref 22–32)
CREATININE: 0.44 mg/dL — AB (ref 0.61–1.24)
Calcium: 8.3 mg/dL — ABNORMAL LOW (ref 8.9–10.3)
GFR calc non Af Amer: >60 mL/min (ref >60)
Glucose, Bld: 83 mg/dL (ref 65–99)
Potassium: 3.6 mmol/L (ref 3.5–5.1)
SODIUM: 141 mmol/L (ref 135–145)

## 2016-03-11 LAB — CBC
HEMATOCRIT: 36.1 % — AB (ref 39.0–52.0)
HEMOGLOBIN: 12.7 g/dL — AB (ref 13.0–17.0)
MCH: 30.1 pg (ref 26.0–34.0)
MCHC: 35.2 g/dL (ref 30.0–36.0)
MCV: 85.5 fL (ref 78.0–100.0)
Platelets: 194 10*3/uL (ref 150–400)
RBC: 4.22 MIL/uL (ref 4.22–5.81)
RDW: 12.9 % (ref 11.5–15.5)
WBC: 4.6 10*3/uL (ref 4.0–10.5)

## 2016-03-11 LAB — GLUCOSE, CAPILLARY
GLUCOSE-CAPILLARY: 210 mg/dL — AB (ref 65–99)
Glucose-Capillary: 78 mg/dL (ref 65–99)

## 2016-03-11 LAB — MAGNESIUM: MAGNESIUM: 1.9 mg/dL (ref 1.7–2.4)

## 2016-03-11 LAB — HEMOGLOBIN A1C
HEMOGLOBIN A1C: 13.9 % — AB (ref 4.8–5.6)
MEAN PLASMA GLUCOSE: 352 mg/dL

## 2016-03-11 MED ORDER — POTASSIUM CHLORIDE CRYS ER 20 MEQ PO TBCR
40.0000 meq | EXTENDED_RELEASE_TABLET | Freq: Once | ORAL | Status: AC
  Administered 2016-03-11: 40 meq via ORAL
  Filled 2016-03-11: qty 2

## 2016-03-11 MED ORDER — TRUEPLUS LANCETS 28G MISC: 1.0000 | Freq: Three times a day (TID) | 0 refills | Status: DC

## 2016-03-11 NOTE — Progress Notes (Signed)
Patient discharged @ 1323 in stable condition.  Educated pt regarding insulin.  Prescription given.  Discharge instructions given.  Teach back completed.  Discharged to front of hospital as pt wishes to take the bus.

## 2016-03-11 NOTE — Care Management Note (Signed)
Case Management Note  Patient Details  Name: [name] [name] MRN: [id number] Date of Birth: Apr 02, 1986  Subjective/Objective:   DKA, Type I DM                 Action/Plan: Discharge Planning: AVS reviewed:   NCM spoke to pt and he has Lantus and Novolog at home. States he works full-time at [employer]. States appetite has been poor recently. Pt will call [facility] to follow up with an appt.   [facility]   Expected Discharge Date:  03/11/2016             Expected Discharge Plan:  Home/Self Care  In-House Referral:  NA  Discharge planning Services  CM Consult, Indigent Health Clinic, Medication Assistance  Post Acute Care Choice:  NA Choice offered to:  NA  DME Arranged:  N/A DME Agency:  NA  HH Arranged:  NA HH Agency:  NA  Status of Service:  Completed, signed off  If discussed at Long Length of Stay Meetings, dates discussed:    Additional Comments:  Elliot CousinShavis, Calise Dunckel Ellen, RN 03/11/2016, 12:45 PM

## 2016-03-11 NOTE — Discharge Summary (Signed)
Discharge Summary  [name] [name] MRN:[id number] DOB: 07/25/1985  PCP: FUNCHES, JOSALYN C, MD  Admit date: 03/09/2016 Discharge date: 03/11/2016  Time spent: <30mins  Recommendations for Outpatient Follow-up:  1. F/u with PMD within a week  for hospital discharge follow up, repeat cbc/bmp at follow up  Discharge Diagnoses:  Active Hospital Problems   Diagnosis Date Noted  . DKA, type 1, not at goal (HCC) 12/02/2015  . Hyperkalemia 03/09/2016  . Renal insufficiency 03/09/2016  . Leukocytosis 03/09/2016  . Diabetic ketoacidosis without coma associated with type 1 diabetes mellitus (HCC)   . Non-intractable vomiting with nausea   . Sinus tachycardia   . DM (diabetes mellitus), type 1, uncontrolled (HCC) 05/01/2014    Resolved Hospital Problems   Diagnosis Date Noted Date Resolved  No resolved problems to display.    Discharge Condition: stable  Diet recommendation: carb modified  Filed Weights   03/09/16 1208  Weight: 54.9 kg (121 lb)    History of present illness:  Patient coming from: home  Chief Complaint: weak  HPI: [name] [name] is a 30 y.o. male with medical history significant of DM1 uncontrolled who as admitted in July with DKA and left AMA. He presents with 2-3 days of weakness and poor appetite. He vomitng this morning after having some water and a fruit cup. He tool 18 U of Lantus last night and a dose of Novolog this AM. No abdominal pain or diarrhea. No fever chills, cold like symptoms, dysuria, rash, ulcers.    ED Course:  Glucose 746, B hydroxybutyric acid > 8.00, PH 7.082, Sodium 133, K 5.2>> 5.1, Cr 1.55 >> 1.00 WBC 13.8 HR in 120-130s  Hospital Course:  Principal Problem:   DKA, type 1, not at goal (HCC) Active Problems:   DM (diabetes mellitus), type 1, uncontrolled (HCC)   Sinus tachycardia   Hyperkalemia   Renal insufficiency   Leukocytosis   Diabetic ketoacidosis without coma associated with type 1 diabetes mellitus (HCC)  Non-intractable vomiting with nausea   Recurrent DKA: likely secondary to meds and diet noncompliance. He was started on inuslin drip and  admitted to stepdown, now better, transitioned to subQ insulin, restarted diet,   Aki: resolved with fluids. ua concentrated, no infection.  Hypokalemia: replaced k, mag 1.9  Leukocytosis/sinu tachycardia: likely from dehydration and stress, resolved.  Type 1 diabetes: a1c range from 13.9 to 16.2 in the last year. Diabetic RN input appreciated,   Body mass index is 19.53 kg/m.   Code Status: full  Family Communication: patient   Disposition Plan: d/c home on 10/21   Consultants:  Diabetic RN  Procedures:  Insulin drip  Antibiotics:  none    Discharge Exam: BP (!) 141/97 (BP Location: Left Arm)   Pulse 91   Temp 98.8 F (37.1 C) (Oral)   Resp 14   Ht 5' 6" (1.676 m)   Wt 54.9 kg (121 lb)   SpO2 100%   BMI 19.53 kg/m     General:  NAD, thin  Cardiovascular: RRR  Respiratory: CTABL  Abdomen: Soft/ND/NT, positive BS  Musculoskeletal: No Edema  Neuro: aaox3    Discharge Instructions You were cared for by a hospitalist during your hospital stay. If you have any questions about your discharge medications or the care you received while you were in the hospital after you are discharged, you can call the unit and asked to speak with the hospitalist on call if the hospitalist that took care of you is not available. Once you   are discharged, your primary care physician will handle any further medical issues. Please note that NO REFILLS for any discharge medications will be authorized once you are discharged, as it is imperative that you return to your primary care physician (or establish a relationship with a primary care physician if you do not have one) for your aftercare needs so that they can reassess your need for medications and monitor your lab values.  Discharge Instructions    Diet Carb Modified     Complete by:  As directed    Increase activity slowly    Complete by:  As directed        Medication List    STOP taking these medications   butalbital-acetaminophen-caffeine 50-325-40 MG tablet Commonly known as:  FIORICET   pantoprazole 40 MG tablet Commonly known as:  PROTONIX     TAKE these medications   glucose blood test strip Commonly known as:  TRUE METRIX BLOOD GLUCOSE TEST 1 each by Other route 3 (three) times daily.   insulin aspart 100 UNIT/ML FlexPen Commonly known as:  NOVOLOG 15 U three times daily with meals and 5 U up to twice daily with snacks What changed:  how much to take  how to take this  when to take this  additional instructions   Insulin Glargine 100 UNIT/ML Solostar Pen Commonly known as:  LANTUS Inject 45 Units into the skin daily at 10 pm.   Insulin Syringe-Needle U-100 31G X 5/16" 0.5 ML Misc Commonly known as:  B-D INS SYRINGE 0.5CC/31GX5/16 1 each by Does not apply route 2 (two) times daily.   TRUE METRIX METER w/Device Kit 1 each by Does not apply route as needed.   TRUEPLUS LANCETS 28G Misc 1 each by Does not apply route 3 (three) times daily.      No Known Allergies Follow-up Information    [facility] .   Why:  please call to arrange follow up appointment Contact information: [address] [place] [address] [phone]           The results of significant diagnostics from this hospitalization (including imaging, microbiology, ancillary and laboratory) are listed below for reference.    Significant Diagnostic Studies: No results found.  Microbiology: Recent Results (from the past 240 hour(s))  MRSA PCR Screening     Status: None   Collection Time: 03/09/16  1:54 PM  Result Value Ref Range Status   MRSA by PCR NEGATIVE NEGATIVE Final    Comment:        The GeneXpert MRSA Assay (FDA approved for NASAL specimens only), is one component of  a comprehensive MRSA colonization surveillance program. It is not intended to diagnose MRSA infection nor to guide or monitor treatment for MRSA infections.      Labs: Basic Metabolic Panel:  Recent Labs Lab 03/10/16 0656 03/10/16 1008 03/10/16 1413 03/10/16 1755 03/11/16 0522  NA 138 136 134* 135 141  K 3.3* 3.2* 3.0* 3.2* 3.6  CL 113* 110 107 108 113*  CO2 20* 19* 23 23 23  GLUCOSE 162* 183* 210* 200* 83  BUN 19 18 16 14 10  CREATININE 0.65 0.58* 0.59* 0.56* 0.44*  CALCIUM 7.6* 7.7* 7.7* 7.9* 8.3*  MG  --   --   --   --  1.9   Liver Function Tests:  Recent Labs Lab 03/09/16 1030  AST 27  ALT 27  ALKPHOS 63  BILITOT 2.2*  PROT 7.9  ALBUMIN   4.6   No results for input(s): LIPASE, AMYLASE in the last 168 hours. No results for input(s): AMMONIA in the last 168 hours. CBC:  Recent Labs Lab 03/09/16 1030 03/09/16 1042 03/11/16 0522  WBC 13.8*  --  4.6  NEUTROABS 12.2*  --   --   HGB 14.1 15.6 12.7*  HCT 42.3 46.0 36.1*  MCV 91.0  --  85.5  PLT 328  --  194   Cardiac Enzymes: No results for input(s): CKTOTAL, CKMB, CKMBINDEX, TROPONINI in the last 168 hours. BNP: BNP (last 3 results) No results for input(s): BNP in the last 8760 hours.  ProBNP (last 3 results) No results for input(s): PROBNP in the last 8760 hours.  CBG:  Recent Labs Lab 03/10/16 1233 03/10/16 1627 03/10/16 2201 03/11/16 0732 03/11/16 1134  GLUCAP 221* 187* 124* 78 210*       Signed:  , MD, PhD  [facility] 03/11/2016, 1:18 PM    

## 2016-03-16 MED FILL — !LANTUS SOLOSTAR 100UNITS/M: 100 | 13 days supply | Qty: 6 | Fill #0

## 2016-03-16 MED FILL — ULTICARE PEN NDL 4MM 32G: 32G X 4 MM | 50 days supply | Qty: 100 | Fill #2

## 2016-03-17 MED FILL — !NOVOLOG 100UNITS/ML VIAL: 100/ML | 3 days supply | Qty: 20 | Fill #0

## 2016-03-27 ENCOUNTER — Other Ambulatory Visit: Payer: Self-pay

## 2016-03-27 DIAGNOSIS — E1010 Type 1 diabetes mellitus with ketoacidosis without coma: Secondary | ICD-10-CM

## 2016-03-27 MED ORDER — "INSULIN SYRINGE-NEEDLE U-100 31G X 5/16"" 0.5 ML MISC": 1.0000 | Freq: Two times a day (BID) | 11 refills | Status: DC

## 2016-04-04 ENCOUNTER — Telehealth: Payer: Self-pay | Admitting: Family Medicine

## 2016-04-04 NOTE — Telephone Encounter (Signed)
Called pt. And no VM available to let him know of medication being in the pharmacy for pick up.

## 2016-05-29 ENCOUNTER — Encounter (HOSPITAL_COMMUNITY): Payer: Self-pay

## 2016-05-29 ENCOUNTER — Inpatient Hospital Stay (HOSPITAL_COMMUNITY)
Admission: EM | Admit: 2016-05-29 | Discharge: 2016-05-31 | DRG: 639 | Disposition: A | Discharge status: Home / Self Care | Payer: Self-pay | Attending: Internal Medicine | Admitting: Internal Medicine

## 2016-05-29 ENCOUNTER — Emergency Department (HOSPITAL_COMMUNITY): Payer: Self-pay

## 2016-05-29 DIAGNOSIS — E860 Dehydration: Secondary | ICD-10-CM | POA: Diagnosis present

## 2016-05-29 DIAGNOSIS — E876 Hypokalemia: Secondary | ICD-10-CM | POA: Diagnosis present

## 2016-05-29 DIAGNOSIS — E1010 Type 1 diabetes mellitus with ketoacidosis without coma: Principal | ICD-10-CM | POA: Diagnosis present

## 2016-05-29 DIAGNOSIS — F1210 Cannabis abuse, uncomplicated: Secondary | ICD-10-CM | POA: Diagnosis present

## 2016-05-29 DIAGNOSIS — Z833 Family history of diabetes mellitus: Secondary | ICD-10-CM

## 2016-05-29 DIAGNOSIS — Z9114 Patient's other noncompliance with medication regimen: Secondary | ICD-10-CM

## 2016-05-29 DIAGNOSIS — Z87891 Personal history of nicotine dependence: Secondary | ICD-10-CM

## 2016-05-29 DIAGNOSIS — R112 Nausea with vomiting, unspecified: Secondary | ICD-10-CM | POA: Diagnosis present

## 2016-05-29 DIAGNOSIS — E785 Hyperlipidemia, unspecified: Secondary | ICD-10-CM | POA: Diagnosis present

## 2016-05-29 DIAGNOSIS — Z794 Long term (current) use of insulin: Secondary | ICD-10-CM

## 2016-05-29 DIAGNOSIS — Z9119 Patient's noncompliance with other medical treatment and regimen: Secondary | ICD-10-CM

## 2016-05-29 LAB — BLOOD GAS, VENOUS
ACID-BASE DEFICIT: 9.6 mmol/L — AB (ref 0.0–2.0)
BICARBONATE: 16.6 mmol/L — AB (ref 20.0–28.0)
FIO2: 0.21
O2 SAT: 62.1 %
PATIENT TEMPERATURE: 98.6
pCO2, Ven: 38.4 mmHg — ABNORMAL LOW (ref 44.0–60.0)
pH, Ven: 7.26 (ref 7.250–7.430)
pO2, Ven: 34.1 mmHg (ref 32.0–45.0)

## 2016-05-29 LAB — CBG MONITORING, ED
GLUCOSE-CAPILLARY: 144 mg/dL — AB (ref 65–99)
GLUCOSE-CAPILLARY: 118 mg/dL — AB (ref 65–99)
Glucose-Capillary: 250 mg/dL — ABNORMAL HIGH (ref 65–99)

## 2016-05-29 LAB — BASIC METABOLIC PANEL
ANION GAP: 21 — AB (ref 5–15)
BUN: 17 mg/dL (ref 6–20)
CALCIUM: 9.3 mg/dL (ref 8.9–10.3)
CHLORIDE: 100 mmol/L — AB (ref 101–111)
CO2: 15 mmol/L — AB (ref 22–32)
Creatinine, Ser: 0.82 mg/dL (ref 0.61–1.24)
GFR calc non Af Amer: >60 mL/min (ref >60)
Glucose, Bld: 184 mg/dL — ABNORMAL HIGH (ref 65–99)
Potassium: 3.4 mmol/L — ABNORMAL LOW (ref 3.5–5.1)
SODIUM: 136 mmol/L (ref 135–145)

## 2016-05-29 LAB — HEPATIC FUNCTION PANEL
ALBUMIN: 4.9 g/dL (ref 3.5–5.0)
ALK PHOS: 83 U/L (ref 38–126)
ALT: 40 U/L (ref 17–63)
AST: 26 U/L (ref 15–41)
BILIRUBIN INDIRECT: 1.4 mg/dL — AB (ref 0.3–0.9)
Bilirubin, Direct: 0.2 mg/dL (ref 0.1–0.5)
TOTAL PROTEIN: 8.4 g/dL — AB (ref 6.5–8.1)
Total Bilirubin: 1.6 mg/dL — ABNORMAL HIGH (ref 0.3–1.2)

## 2016-05-29 LAB — LIPASE, BLOOD: LIPASE: 34 U/L (ref 11–51)

## 2016-05-29 LAB — I-STAT TROPONIN, ED: TROPONIN I, POC: 0 ng/mL (ref 0.00–0.08)

## 2016-05-29 LAB — CBC
HCT: 45 % (ref 39.0–52.0)
HEMOGLOBIN: 16.7 g/dL (ref 13.0–17.0)
MCH: 30.4 pg (ref 26.0–34.0)
MCHC: 37.1 g/dL — ABNORMAL HIGH (ref 30.0–36.0)
MCV: 81.8 fL (ref 78.0–100.0)
Platelets: 325 10*3/uL (ref 150–400)
RBC: 5.5 MIL/uL (ref 4.22–5.81)
RDW: 12.4 % (ref 11.5–15.5)
WBC: 7.8 10*3/uL (ref 4.0–10.5)

## 2016-05-29 LAB — I-STAT CG4 LACTIC ACID, ED
LACTIC ACID, VENOUS: 2.57 mmol/L — AB (ref 0.5–1.9)
LACTIC ACID, VENOUS: 0.73 mmol/L (ref 0.5–1.9)

## 2016-05-29 LAB — BETA-HYDROXYBUTYRIC ACID: Beta-Hydroxybutyric Acid: 6.21 mmol/L — ABNORMAL HIGH (ref 0.05–0.27)

## 2016-05-29 LAB — GLUCOSE, CAPILLARY: Glucose-Capillary: 231 mg/dL — ABNORMAL HIGH (ref 65–99)

## 2016-05-29 MED ORDER — SODIUM CHLORIDE 0.9 % IV SOLN: 30.0000 meq | Freq: Once | INTRAVENOUS | Status: DC

## 2016-05-29 MED ORDER — ONDANSETRON HCL 4 MG/2ML IJ SOLN
4.0000 mg | Freq: Once | INTRAMUSCULAR | Status: AC
  Administered 2016-05-29: 4 mg via INTRAVENOUS
  Filled 2016-05-29: qty 2

## 2016-05-29 MED ORDER — SODIUM CHLORIDE 0.9 % IV BOLUS (SEPSIS)
1000.0000 mL | Freq: Once | INTRAVENOUS | Status: AC
  Administered 2016-05-29: 1000 mL via INTRAVENOUS

## 2016-05-29 MED ORDER — DEXTROSE-NACL 5-0.45 % IV SOLN
INTRAVENOUS | Status: DC
  Administered 2016-05-29: 23:00:00 via INTRAVENOUS

## 2016-05-29 MED ORDER — SODIUM CHLORIDE 0.9 % IV SOLN: INTRAVENOUS | Status: AC

## 2016-05-29 MED ORDER — SODIUM CHLORIDE 0.9 % IV SOLN
INTRAVENOUS | Status: DC
  Administered 2016-05-29: 21:00:00 via INTRAVENOUS

## 2016-05-29 MED ORDER — DEXTROSE-NACL 5-0.45 % IV SOLN
INTRAVENOUS | Status: DC
  Administered 2016-05-29 – 2016-05-30 (×2): via INTRAVENOUS

## 2016-05-29 MED ORDER — POTASSIUM CHLORIDE 10 MEQ/100ML IV SOLN
10.0000 meq | INTRAVENOUS | Status: AC
  Administered 2016-05-29 – 2016-05-30 (×3): 10 meq via INTRAVENOUS
  Filled 2016-05-29 (×3): qty 100

## 2016-05-29 MED ORDER — INSULIN REGULAR HUMAN 100 UNIT/ML IJ SOLN
INTRAMUSCULAR | Status: DC
  Filled 2016-05-29: qty 2.5

## 2016-05-29 MED ORDER — SODIUM CHLORIDE 0.9 % IV SOLN
INTRAVENOUS | Status: DC
  Administered 2016-05-29: 0.8 [IU]/h via INTRAVENOUS
  Filled 2016-05-29: qty 2.5

## 2016-05-29 MED ORDER — ENOXAPARIN SODIUM 40 MG/0.4ML ~~LOC~~ SOLN
40.0000 mg | Freq: Every day | SUBCUTANEOUS | Status: DC
  Filled 2016-05-29: qty 0.4

## 2016-05-29 MED ORDER — KCL IN DEXTROSE-NACL 40-5-0.45 MEQ/L-%-% IV SOLN
INTRAVENOUS | Status: DC
  Filled 2016-05-29: qty 1000

## 2016-05-29 MED ORDER — SODIUM CHLORIDE 0.9 % IV SOLN: INTRAVENOUS | Status: DC

## 2016-05-29 NOTE — H&P (Signed)
[name] [name] HYQ:[id number] DOB: [birth year] DOA: 05/29/2016     PCP: Minerva Ends, MD   Outpatient Specialists: none   Patient coming from:    home Lives   With family    Chief Complaint: Elevated blood sugar  HPI: [name] [name] is a 31 y.o. male with medical history significant of DM 1 poorly controlled. Recurrent DKA, polysubstance abuse     Presented with nausea and vomiting for the past 2 days patient's blood glucose meter read as high and he called EMS. He reports he did not take his insulin last night. EMS arrival blood sugar was 255. Patient also endorsing chest pain that started since this afternoon  He endorses associated shortness of breath but states some days prior DKA no coughing no sore throat no fevers. Reports that he ran out of Lantus yesterday and was not able to refill. denies any sick contacts.  Regarding pertinent Chronic problems: he has had recurrent admissions in the past for DKA secondary to medications as well as diet noncompliance and had left AMA multiple times he has chronic recurrent nausea and vomiting and marijuana abuse   IN ER:  Temp (24hrs), Avg:98.8 F (37.1 C), Min:98.8 F (37.1 C), Max:98.8 F (37.1 C)   RR 12 HR114 BP 155/117 Lactic acid 2.57 VBG 7.26 /38.4 NA 136 K 3.4 Cr 0.82 baseline of 0.44 Anion gap 21 WBC 7.8  Chest x-ray unremarkable  Following Medications were ordered in ER: Medications  insulin regular (NOVOLIN R,HUMULIN R) 250 Units in sodium chloride 0.9 % 250 mL (1 Units/mL) infusion (not administered)  dextrose 5 %-0.45 % sodium chloride infusion (not administered)  0.9 %  sodium chloride infusion (not administered)  dextrose 5 % and 0.45 % NaCl with KCl 40 mEq/L infusion (not administered)  sodium chloride 0.9 % bolus 1,000 mL (1,000 mLs Intravenous New Bag/Given 05/29/16 2005)  sodium chloride 0.9 % bolus 1,000 mL (1,000 mLs Intravenous New Bag/Given 05/29/16 2005)  ondansetron (ZOFRAN) injection 4 mg (4 mg  Intravenous Given 05/29/16 2005)      Hospitalist was called for admission for DKA Center medical noncompliance, dehydration and lactic acidosis  Review of Systems:    Pertinent positives include:  nausea, vomiting, shortness of breath at rest. chest pain,  Constitutional:  No weight loss, night sweats, Fevers, chills, fatigue, weight loss  HEENT:  No headaches, Difficulty swallowing,Tooth/dental problems,Sore throat,  No sneezing, itching, ear ache, nasal congestion, post nasal drip,  Cardio-vascular:  No  Orthopnea, PND, anasarca, dizziness, palpitations.no Bilateral lower extremity swelling  GI:  No heartburn, indigestion, abdominal pain,diarrhea, change in bowel habits, loss of appetite, melena, blood in stool, hematemesis Resp:  no No dyspnea on exertion, No excess mucus, no productive cough, No non-productive cough, No coughing up of blood.No change in color of mucus.No wheezing. Skin:  no rash or lesions. No jaundice GU:  no dysuria, change in color of urine, no urgency or frequency. No straining to urinate.  No flank pain.  Musculoskeletal:  No joint pain or no joint swelling. No decreased range of motion. No back pain.  Psych:  No change in mood or affect. No depression or anxiety. No memory loss.  Neuro: no localizing neurological complaints, no tingling, no weakness, no double vision, no gait abnormality, no slurred speech, no confusion  As per HPI otherwise 10 point review of systems negative.   Past Medical History: Past Medical History:  Diagnosis Date  . Diabetes mellitus without complication ([place]) Dx 9528  .  Renal insufficiency 2011   pt was on dialysis for a short time approx 2011, pt is no longer on dialysis   History reviewed. No pertinent surgical history.   Social History:  Ambulatory   independently      reports that he quit smoking about 2 years ago. He has never used smokeless tobacco. He reports that he does not drink alcohol or use  drugs.  Allergies:  No Known Allergies     Family History:   Family History  Problem Relation Age of Onset  . Diabetes Father   . Diabetes Maternal Grandmother   . Heart disease Neg Hx   . Hypertension Neg Hx     Medications: Prior to Admission medications   Medication Sig Start Date End Date Taking? Authorizing Provider  Blood Glucose Monitoring Suppl (TRUE METRIX METER) w/Device KIT 1 each by Does not apply route as needed. 06/28/15  Yes Josalyn Funches, MD  glucose blood (TRUE METRIX BLOOD GLUCOSE TEST) test strip 1 each by Other route 3 (three) times daily. 06/28/15  Yes Josalyn Funches, MD  insulin aspart (NOVOLOG) 100 UNIT/ML FlexPen 15 U three times daily with meals and 5 U up to twice daily with snacks Patient taking differently: Inject 15 Units into the skin 3 (three) times daily. 15 U three times daily with meals 02/07/16  Yes Josalyn Funches, MD  Insulin Glargine (LANTUS) 100 UNIT/ML Solostar Pen Inject 45 Units into the skin daily at 10 pm. Patient taking differently: Inject 35 Units into the skin daily at 10 pm.  02/07/16  Yes Josalyn Funches, MD  Insulin Syringe-Needle U-100 (B-D INS SYRINGE 0.5CC/31GX5/16) 31G X 5/16" 0.5 ML MISC 1 each by Does not apply route 2 (two) times daily. 03/27/16  Yes Josalyn Funches, MD  TRUEPLUS LANCETS 28G MISC 1 each by Does not apply route 3 (three) times daily. 03/11/16  Yes Florencia Reasons, MD    Physical Exam: Patient Vitals for the past 24 hrs:  BP Temp Temp src Pulse Resp SpO2  05/29/16 2030 (!) 155/117 - - 114 12 100 %  05/29/16 2015 144/93 - - - 18 -  05/29/16 2000 (!) 154/113 - - (!) 131 15 99 %  05/29/16 1945 (!) 143/113 - - (!) 125 19 99 %  05/29/16 1933 (!) 140/110 - - - - -  05/29/16 1930 (!) 139/114 - - - 15 -  05/29/16 1928 (!) 144/112 - - (!) 132 17 100 %  05/29/16 1812 (!) 136/103 98.8 F (37.1 C) Oral (!) 135 13 97 %    1. General:  in No Acute distress 2. Psychological: Alert and  Oriented 3. Head/ENT:     Dry Mucous  Membranes                          Head Non traumatic, neck supple                          Normal  Dentition 4. SKIN:  decreased Skin turgor,  Skin clean Dry and intact no rash 5. Heart: Regular rate and rhythm no Murmur, Rub or gallop 6. Lungs:  Clear to auscultation bilaterally, no wheezes or crackles   7. Abdomen: Soft,  non-tender, Non distended 8. Lower extremities: no clubbing, cyanosis, or edema 9. Neurologically Grossly intact, moving all 4 extremities equally   10. MSK: Normal range of motion   body mass index is unknown because there is  no height or weight on file.  Labs on Admission:   Labs on Admission: I have personally reviewed following labs and imaging studies  CBC:  Recent Labs Lab 05/29/16 1857  WBC 7.8  HGB 16.7  HCT 45.0  MCV 81.8  PLT 749   Basic Metabolic Panel:  Recent Labs Lab 05/29/16 1857  NA 136  K 3.4*  CL 100*  CO2 15*  GLUCOSE 184*  BUN 17  CREATININE 0.82  CALCIUM 9.3   GFR: CrCl cannot be calculated (Unknown ideal weight.). Liver Function Tests:  Recent Labs Lab 05/29/16 1857  AST 26  ALT 40  ALKPHOS 83  BILITOT 1.6*  PROT 8.4*  ALBUMIN 4.9    Recent Labs Lab 05/29/16 1857  LIPASE 34   No results for input(s): AMMONIA in the last 168 hours. Coagulation Profile: No results for input(s): INR, PROTIME in the last 168 hours. Cardiac Enzymes: No results for input(s): CKTOTAL, CKMB, CKMBINDEX, TROPONINI in the last 168 hours. BNP (last 3 results) No results for input(s): PROBNP in the last 8760 hours. HbA1C: No results for input(s): HGBA1C in the last 72 hours. CBG:  Recent Labs Lab 05/29/16 1754  GLUCAP 250*   Lipid Profile: No results for input(s): CHOL, HDL, LDLCALC, TRIG, CHOLHDL, LDLDIRECT in the last 72 hours. Thyroid Function Tests: No results for input(s): TSH, T4TOTAL, FREET4, T3FREE, THYROIDAB in the last 72 hours. Anemia Panel: No results for input(s): VITAMINB12, FOLATE, FERRITIN, TIBC, IRON,  RETICCTPCT in the last 72 hours.  Sepsis Labs: _0 (procalcitonin:4,lacticidven:4) )No results found for this or any previous visit (from the past 240 hour(s)).     UA   ordered  Lab Results  Component Value Date   HGBA1C 13.9 (H) 03/10/2016    CrCl cannot be calculated (Unknown ideal weight.).  BNP (last 3 results) No results for input(s): PROBNP in the last 8760 hours.   ECG REPORT  Independently reviewed Rate: 130  Rhythm:  Sinus tachycardia ST&T Change: No acute ischemic changes   QTC 402  There were no vitals filed for this visit.   Cultures:    Component Value Date/Time   SDES URINE, CATHETERIZED 06/15/2015 1745   SPECREQUEST NONE 06/15/2015 1745   CULT  06/15/2015 1745    NO GROWTH 2 DAYS Performed at [facility] 06/17/2015 FINAL 06/15/2015 1745     Radiological Exams on Admission: Dg Chest 2 View  Result Date: 05/29/2016 CLINICAL DATA:  Shortness of breath and central chest pain. History of hypertension, diabetes. EXAM: CHEST  2 VIEW COMPARISON:  Chest radiograph December 18, 2015 FINDINGS: Cardiomediastinal silhouette is normal. No pleural effusions or focal consolidations. Trachea projects midline and there is no pneumothorax. Soft tissue planes and included osseous structures are non-suspicious. IMPRESSION: Normal chest. Electronically Signed   By: Elon Alas M.D.   On: 05/29/2016 18:49    Chart has been reviewed    Assessment/Plan  31 y.o. male with medical history significant of DM 1 poorly controlled. Recurrent DKA, polysubstance abuse Admitted for DKA and dehydration  Present on Admission: . DKA, type 1, not at goal [facility])- will admit per DKA protocol, obtain serial BMET, start on glucose stabilizer insulin IV, aggressive IVF. Change IVF to D5 1/2 Na after BG <250 . Will work up cause of DKA:   CXR negative , ECG one set of cardiac enzymes unremarkable, UA. Monitor in [place]. Replace potassium as needed. diabetes  care coordinator consult Elevated lactic acid widely secondary to dehydration  no evidence of infection at this point given nausea and vomiting and generalized malaise will test for influenza  . Non-intractable vomiting with nausea department DKA but also could be secondary to marijuana abuse supportive management . Uncontrolled type 1 diabetes mellitus with ketoacidosis without coma [facility]) diabetic Coordinator consult . Hypokalemia replaceUrine electrolytes . Dehydration- will rehydrate Follow creatinine   Other plan as per orders.  DVT prophylaxis:   Lovenox     Code Status:  FULL CODE  as per patient    Family Communication:   Family not  at  Bedside    Disposition Plan:   To home once workup is complete and patient is stable     Consults called: none    Admission status:   obs   Level of care      SDU      I have spent a total of 56 min on this admission    Thao Vanover 05/29/2016, 9:12 PM    [facility]  Pager [phone]   after 2 AM please page floor coverage PA If 7AM-7PM, please contact the day team taking care of the patient  [email or web address]  Password [email or web address]

## 2016-05-29 NOTE — ED Provider Notes (Signed)
[place] DEPT Provider Note   CSN: [id number] Arrival date & time: 05/29/16  1748     History   Chief Complaint Chief Complaint  Patient presents with  . Hyperglycemia  . Chest Pain  . Emesis    HPI [name] [name] is a 31 y.o. male.  HPI   2 days of shortness of breath, then started having nausea, vomiting. Vomiting today at least 4-5 times.  No diarrhea.  Shortness of breath feels like prior DKA.  No cough, no sore throat, no fevers.  Ran out of lantus yesterday and did not get nightime dose. No other change in insulin.    No leg pain or swelling, no hx of DVT.  Past Medical History:  Diagnosis Date  . Diabetes mellitus without complication ([place]) Dx 3244  . Renal insufficiency 2011   pt was on dialysis for a short time approx 2011, pt is no longer on dialysis    Patient Active Problem List   Diagnosis Date Noted  . Hyperkalemia 03/09/2016  . Renal insufficiency 03/09/2016  . Leukocytosis 03/09/2016  . Diabetic ketoacidosis without coma associated with type 1 diabetes mellitus ([place])   . Non-intractable vomiting with nausea   . Chest pain 12/18/2015  . DKA, type 1, not at goal [facility]) 12/02/2015  . DKA (diabetic ketoacidoses) ([place]) 12/01/2015  . Marijuana use 07/22/2015  . Proteinuria with type 1 diabetes mellitus ([place]) 07/22/2015  . High blood pressure 07/22/2015  . Uncontrolled type 1 diabetes mellitus with ketoacidosis without coma ([place])   . Hypocalcemia   . Acute renal failure ([place])   . Hypokalemia   . Hypomagnesemia   . Abnormal EKG 06/15/2015  . Sinus tachycardia   . Tachypnea   . Tinea pedis 12/17/2014  . Adjustment disorder with mixed anxiety and depressed mood 09/03/2014  . Protein-calorie malnutrition, severe ([place]) 07/06/2014  . DM (diabetes mellitus), type 1, uncontrolled ([place]) 05/01/2014  . HLD (hyperlipidemia) 04/26/2014  . Substance abuse   . Dehydration 07/07/2013  . Fatigue 03/10/2012    History reviewed. No pertinent surgical  history.     Home Medications    Prior to Admission medications   Medication Sig Start Date End Date Taking? Authorizing Provider  Blood Glucose Monitoring Suppl (TRUE METRIX METER) w/Device KIT 1 each by Does not apply route as needed. 06/28/15  Yes Josalyn Funches, MD  glucose blood (TRUE METRIX BLOOD GLUCOSE TEST) test strip 1 each by Other route 3 (three) times daily. 06/28/15  Yes Josalyn Funches, MD  insulin aspart (NOVOLOG) 100 UNIT/ML FlexPen 15 U three times daily with meals and 5 U up to twice daily with snacks Patient taking differently: Inject 15 Units into the skin 3 (three) times daily. 15 U three times daily with meals 02/07/16  Yes Josalyn Funches, MD  Insulin Glargine (LANTUS) 100 UNIT/ML Solostar Pen Inject 45 Units into the skin daily at 10 pm. Patient taking differently: Inject 35 Units into the skin daily at 10 pm.  02/07/16  Yes Josalyn Funches, MD  Insulin Syringe-Needle U-100 (B-D INS SYRINGE 0.5CC/31GX5/16) 31G X 5/16" 0.5 ML MISC 1 each by Does not apply route 2 (two) times daily. 03/27/16  Yes Josalyn Funches, MD  TRUEPLUS LANCETS 28G MISC 1 each by Does not apply route 3 (three) times daily. 03/11/16  Yes Florencia Reasons, MD    Family History Family History  Problem Relation Age of Onset  . Diabetes Father   . Diabetes Maternal Grandmother   . Heart disease Neg Hx   .  Hypertension Neg Hx     Social History Social History  Substance Use Topics  . Smoking status: Former Smoker    Quit date: 09/19/2013  . Smokeless tobacco: Never Used  . Alcohol use No     Allergies   Patient has no known allergies.   Review of Systems Review of Systems  Constitutional: Negative for fever.  HENT: Negative for sore throat.   Eyes: Negative for visual disturbance.  Respiratory: Positive for cough and shortness of breath.   Cardiovascular: Negative for chest pain (denies, at first reported this but then reports it is more of a feeling of dyspnea) and leg swelling.   Gastrointestinal: Negative for abdominal pain.  Genitourinary: Negative for difficulty urinating.  Musculoskeletal: Negative for back pain and neck stiffness.  Skin: Negative for rash.  Neurological: Negative for syncope and headaches.     Physical Exam Updated Vital Signs BP (!) 138/100   Pulse 113   Temp 98.4 F (36.9 C) (Oral)   Resp 13   Ht _0  (1.676 m)   Wt 104 lb 15 oz (47.6 kg)   SpO2 100%   BMI 16.94 kg/m   Physical Exam  Constitutional: He is oriented to person, place, and time. He appears well-developed. He appears ill. No distress.  HENT:  Head: Normocephalic and atraumatic.  Eyes: Conjunctivae and EOM are normal.  Neck: Normal range of motion.  Cardiovascular: Regular rhythm, normal heart sounds and intact distal pulses.  Tachycardia present.  Exam reveals no gallop and no friction rub.   No murmur heard. Pulmonary/Chest: Effort normal and breath sounds normal. No respiratory distress. He has no wheezes. He has no rales.  Abdominal: Soft. He exhibits no distension. There is no tenderness. There is no guarding.  Musculoskeletal: He exhibits no edema.  Neurological: He is alert and oriented to person, place, and time.  Skin: Skin is warm and dry. He is not diaphoretic.  Nursing note and vitals reviewed.    ED Treatments / Results  Labs (all labs ordered are listed, but only abnormal results are displayed) Labs Reviewed  BASIC METABOLIC PANEL - Abnormal; Notable for the following:       Result Value   Potassium 3.4 (*)    Chloride 100 (*)    CO2 15 (*)    Glucose, Bld 184 (*)    Anion gap 21 (*)    All other components within normal limits  CBC - Abnormal; Notable for the following:    MCHC 37.1 (*)    All other components within normal limits  URINALYSIS, ROUTINE W REFLEX MICROSCOPIC - Abnormal; Notable for the following:    Glucose, UA >=500 (*)    Ketones, ur 80 (*)    Protein, ur 30 (*)    Bacteria, UA RARE (*)    Squamous Epithelial / LPF  0-5 (*)    All other components within normal limits  HEPATIC FUNCTION PANEL - Abnormal; Notable for the following:    Total Protein 8.4 (*)    Total Bilirubin 1.6 (*)    Indirect Bilirubin 1.4 (*)    All other components within normal limits  BLOOD GAS, VENOUS - Abnormal; Notable for the following:    pCO2, Ven 38.4 (*)    Bicarbonate 16.6 (*)    Acid-base deficit 9.6 (*)    All other components within normal limits  BETA-HYDROXYBUTYRIC ACID - Abnormal; Notable for the following:    Beta-Hydroxybutyric Acid 6.21 (*)    All other components within  normal limits  RAPID URINE DRUG SCREEN, HOSP PERFORMED - Abnormal; Notable for the following:    Tetrahydrocannabinol POSITIVE (*)    All other components within normal limits  BASIC METABOLIC PANEL - Abnormal; Notable for the following:    Potassium 3.4 (*)    CO2 12 (*)    Glucose, Bld 270 (*)    Calcium 8.0 (*)    Anion gap 19 (*)    All other components within normal limits  BASIC METABOLIC PANEL - Abnormal; Notable for the following:    Sodium 134 (*)    Potassium 3.4 (*)    CO2 20 (*)    Glucose, Bld 154 (*)    Creatinine, Ser 0.59 (*)    Calcium 7.8 (*)    All other components within normal limits  GLUCOSE, CAPILLARY - Abnormal; Notable for the following:    Glucose-Capillary 231 (*)    All other components within normal limits  GLUCOSE, CAPILLARY - Abnormal; Notable for the following:    Glucose-Capillary 269 (*)    All other components within normal limits  GLUCOSE, CAPILLARY - Abnormal; Notable for the following:    Glucose-Capillary 218 (*)    All other components within normal limits  GLUCOSE, CAPILLARY - Abnormal; Notable for the following:    Glucose-Capillary 203 (*)    All other components within normal limits  GLUCOSE, CAPILLARY - Abnormal; Notable for the following:    Glucose-Capillary 153 (*)    All other components within normal limits  GLUCOSE, CAPILLARY - Abnormal; Notable for the following:     Glucose-Capillary 143 (*)    All other components within normal limits  GLUCOSE, CAPILLARY - Abnormal; Notable for the following:    Glucose-Capillary 117 (*)    All other components within normal limits  GLUCOSE, CAPILLARY - Abnormal; Notable for the following:    Glucose-Capillary 120 (*)    All other components within normal limits  CBG MONITORING, ED - Abnormal; Notable for the following:    Glucose-Capillary 250 (*)    All other components within normal limits  CBG MONITORING, ED - Abnormal; Notable for the following:    Glucose-Capillary 144 (*)    All other components within normal limits  I-STAT CG4 LACTIC ACID, ED - Abnormal; Notable for the following:    Lactic Acid, Venous 2.57 (*)    All other components within normal limits  CBG MONITORING, ED - Abnormal; Notable for the following:    Glucose-Capillary 118 (*)    All other components within normal limits  MRSA PCR SCREENING  LIPASE, BLOOD  TROPONIN I  TROPONIN I  LACTIC ACID, PLASMA  LACTIC ACID, PLASMA  INFLUENZA PANEL BY PCR (TYPE A & B, W7P7)  BASIC METABOLIC PANEL  BASIC METABOLIC PANEL  TROPONIN I  HEMOGLOBIN T0G  BASIC METABOLIC PANEL  BASIC METABOLIC PANEL  TROPONIN I  TROPONIN I  GLUCOSE, CAPILLARY  I-STAT TROPOININ, ED  I-STAT VENOUS BLOOD GAS, ED  I-STAT CG4 LACTIC ACID, ED    EKG  EKG Interpretation  Date/Time:  Monday May 29 2016 18:08:50 EST Ventricular Rate:  130 PR Interval:    QRS Duration: 68 QT Interval:  273 QTC Calculation: 402 R Axis:   87 Text Interpretation:  Sinus tachycardia Consider right atrial enlargement Probable anteroseptal infarct, old since last tracing no significant change Confirmed by BELFI  MD, MELANIE ([id number]) on 05/29/2016 6:23:56 PM Also confirmed by BELFI  MD, MELANIE ([id number]), editor [place]  CCT, BEVERLY ([id number])  on 05/30/2016  6:47:36 AM       Radiology Dg Chest 2 View  Result Date: 05/29/2016 CLINICAL DATA:  Shortness of breath and central chest pain.  History of hypertension, diabetes. EXAM: CHEST  2 VIEW COMPARISON:  Chest radiograph December 18, 2015 FINDINGS: Cardiomediastinal silhouette is normal. No pleural effusions or focal consolidations. Trachea projects midline and there is no pneumothorax. Soft tissue planes and included osseous structures are non-suspicious. IMPRESSION: Normal chest. Electronically Signed   By: Elon Alas M.D.   On: 05/29/2016 18:49    Procedures Procedures (including critical care time)  Medications Ordered in ED Medications  0.9 %  sodium chloride infusion ( Intravenous Duplicate 05/28/49 0258)  0.9 %  sodium chloride infusion ( Intravenous Not Given 05/29/16 2330)  dextrose 5 %-0.45 % sodium chloride infusion ( Intravenous New Bag/Given 05/30/16 0534)  insulin regular (NOVOLIN R,HUMULIN R) 250 Units in sodium chloride 0.9 % 250 mL (1 Units/mL) infusion (0.5 Units/hr Intravenous Rate/Dose Change 05/30/16 0838)  enoxaparin (LOVENOX) injection 40 mg (not administered)  insulin glargine (LANTUS) injection 30 Units (0 Units Subcutaneous Duplicate 09/21/75 8242)  sodium chloride 0.9 % bolus 1,000 mL (0 mLs Intravenous Stopped 05/29/16 2121)  sodium chloride 0.9 % bolus 1,000 mL (0 mLs Intravenous Stopped 05/29/16 2121)  ondansetron (ZOFRAN) injection 4 mg (4 mg Intravenous Given 05/29/16 2005)  potassium chloride 10 mEq in 100 mL IVPB (10 mEq Intravenous Given 05/30/16 0153)     Initial Impression / Assessment and Plan / ED Course  I have reviewed the triage vital signs and the nursing notes.  Pertinent labs & imaging results that were available during my care of the patient were reviewed by me and considered in my medical decision making (see chart for details).  CRITICAL CARE DKA Performed by: Alvino Chapel   Total critical care time:30 minutes  Critical care time was exclusive of separately billable procedures and treating other patients.  Critical care was necessary to treat or prevent imminent or  life-threatening deterioration.  Critical care was time spent personally by me on the following activities: development of treatment plan with patient and/or surrogate as well as nursing, discussions with consultants, evaluation of patient's response to treatment, examination of patient, obtaining history from patient or surrogate, ordering and performing treatments and interventions, ordering and review of laboratory studies, ordering and review of radiographic studies, pulse oximetry and re-evaluation of patient's condition.  Clinical Course    31yo male hx of DM presents with concern for nausea, vomiting, dyspnea. Had reported CP in triage however denies on my hx, have low suspicion for ACS. No hypoxia, doubt PE. Reports dyspnea similar to prior DKA. Laboratory work consistent with DKA although glucose is no longer significantly elevated. Lactic acidosis likely secondary to dehydration in the setting of DKA and doubt sepsis.  Pt initiated on insulin, given K. Admitted for further care.    Final Clinical Impressions(s) / ED Diagnoses   Final diagnoses:  Diabetic ketoacidosis without coma associated with type 1 diabetes mellitus [facility])    New Prescriptions Current Discharge Medication List       Gareth Morgan, MD 05/30/16 [phone]

## 2016-05-29 NOTE — ED Triage Notes (Signed)
Pt c/o hyperglycemia and n/v/d x 2 days and central chest pain starting this afternoon.  Pain score 7/10.  Pt denies associated cardiac complaints.  Hx of DM and reports he is compliant w/ medications.

## 2016-05-29 NOTE — ED Triage Notes (Signed)
PT RECEIVED FROM HOME VIA EMS FOR HYPERGLYCEMIA. PER EMS THE PT HAD A READING OF "HI" AT HOME. PER EMS CBG OF 255. PT ALSO C/O N/V/D X2 DAYS.

## 2016-05-29 NOTE — ED Notes (Signed)
Urinal at bedside, pt aware that urine is needed

## 2016-05-30 LAB — TROPONIN I
Troponin I: <0.03 ng/mL (ref <0.03)
Troponin I: <0.03 ng/mL (ref <0.03)
Troponin I: <0.03 ng/mL (ref <0.03)
Troponin I: <0.03 ng/mL (ref <0.03)
Troponin I: <0.03 ng/mL (ref <0.03)

## 2016-05-30 LAB — BASIC METABOLIC PANEL
ANION GAP: 8 (ref 5–15)
Anion gap: 19 — ABNORMAL HIGH (ref 5–15)
Anion gap: 7 (ref 5–15)
Anion gap: 8 (ref 5–15)
Anion gap: 8 (ref 5–15)
BUN: 11 mg/dL (ref 6–20)
BUN: 10 mg/dL (ref 6–20)
BUN: 11 mg/dL (ref 6–20)
BUN: 13 mg/dL (ref 6–20)
BUN: 15 mg/dL (ref 6–20)
CHLORIDE: 107 mmol/L (ref 101–111)
CO2: 12 mmol/L — ABNORMAL LOW (ref 22–32)
CO2: 20 mmol/L — AB (ref 22–32)
CO2: 20 mmol/L — ABNORMAL LOW (ref 22–32)
CO2: 20 mmol/L — ABNORMAL LOW (ref 22–32)
CO2: 21 mmol/L — ABNORMAL LOW (ref 22–32)
CREATININE: 0.43 mg/dL — AB (ref 0.61–1.24)
CREATININE: 0.51 mg/dL — AB (ref 0.61–1.24)
CREATININE: 0.62 mg/dL (ref 0.61–1.24)
Calcium: 7.8 mg/dL — ABNORMAL LOW (ref 8.9–10.3)
Calcium: 7.8 mg/dL — ABNORMAL LOW (ref 8.9–10.3)
Calcium: 7.8 mg/dL — ABNORMAL LOW (ref 8.9–10.3)
Calcium: 7.9 mg/dL — ABNORMAL LOW (ref 8.9–10.3)
Calcium: 8 mg/dL — ABNORMAL LOW (ref 8.9–10.3)
Chloride: 105 mmol/L (ref 101–111)
Chloride: 105 mmol/L (ref 101–111)
Chloride: 105 mmol/L (ref 101–111)
Chloride: 106 mmol/L (ref 101–111)
Creatinine, Ser: 0.59 mg/dL — ABNORMAL LOW (ref 0.61–1.24)
Creatinine, Ser: 0.44 mg/dL — ABNORMAL LOW (ref 0.61–1.24)
GFR calc Af Amer: >60 mL/min (ref >60)
GFR calc Af Amer: >60 mL/min (ref >60)
GFR calc non Af Amer: >60 mL/min (ref >60)
GLUCOSE: 143 mg/dL — AB (ref 65–99)
GLUCOSE: 154 mg/dL — AB (ref 65–99)
GLUCOSE: 270 mg/dL — AB (ref 65–99)
Glucose, Bld: 107 mg/dL — ABNORMAL HIGH (ref 65–99)
Glucose, Bld: 147 mg/dL — ABNORMAL HIGH (ref 65–99)
POTASSIUM: 2.8 mmol/L — AB (ref 3.5–5.1)
POTASSIUM: 3.4 mmol/L — AB (ref 3.5–5.1)
POTASSIUM: 3.4 mmol/L — AB (ref 3.5–5.1)
Potassium: 2.9 mmol/L — ABNORMAL LOW (ref 3.5–5.1)
Potassium: 2.8 mmol/L — ABNORMAL LOW (ref 3.5–5.1)
SODIUM: 135 mmol/L (ref 135–145)
SODIUM: 133 mmol/L — AB (ref 135–145)
Sodium: 133 mmol/L — ABNORMAL LOW (ref 135–145)
Sodium: 134 mmol/L — ABNORMAL LOW (ref 135–145)
Sodium: 136 mmol/L (ref 135–145)

## 2016-05-30 LAB — URINALYSIS, ROUTINE W REFLEX MICROSCOPIC
BILIRUBIN URINE: NEGATIVE
Hgb urine dipstick: NEGATIVE
Ketones, ur: 80 mg/dL — AB
LEUKOCYTES UA: NEGATIVE
Nitrite: NEGATIVE
PH: 5 (ref 5.0–8.0)
Protein, ur: 30 mg/dL — AB
SPECIFIC GRAVITY, URINE: 1.029 (ref 1.005–1.030)

## 2016-05-30 LAB — GLUCOSE, CAPILLARY
GLUCOSE-CAPILLARY: 105 mg/dL — AB (ref 65–99)
GLUCOSE-CAPILLARY: 105 mg/dL — AB (ref 65–99)
GLUCOSE-CAPILLARY: 125 mg/dL — AB (ref 65–99)
GLUCOSE-CAPILLARY: 143 mg/dL — AB (ref 65–99)
GLUCOSE-CAPILLARY: 203 mg/dL — AB (ref 65–99)
GLUCOSE-CAPILLARY: 269 mg/dL — AB (ref 65–99)
GLUCOSE-CAPILLARY: 95 mg/dL (ref 65–99)
Glucose-Capillary: 218 mg/dL — ABNORMAL HIGH (ref 65–99)
Glucose-Capillary: 153 mg/dL — ABNORMAL HIGH (ref 65–99)
Glucose-Capillary: 117 mg/dL — ABNORMAL HIGH (ref 65–99)
Glucose-Capillary: 120 mg/dL — ABNORMAL HIGH (ref 65–99)
Glucose-Capillary: 124 mg/dL — ABNORMAL HIGH (ref 65–99)

## 2016-05-30 LAB — INFLUENZA PANEL BY PCR (TYPE A & B)
INFLAPCR: NEGATIVE
Influenza B By PCR: NEGATIVE

## 2016-05-30 LAB — RAPID URINE DRUG SCREEN, HOSP PERFORMED
Amphetamines: NOT DETECTED
BARBITURATES: NOT DETECTED
BENZODIAZEPINES: NOT DETECTED
COCAINE: NOT DETECTED
Opiates: NOT DETECTED
TETRAHYDROCANNABINOL: POSITIVE — AB

## 2016-05-30 LAB — POTASSIUM: POTASSIUM: 2.7 mmol/L — AB (ref 3.5–5.1)

## 2016-05-30 LAB — LACTIC ACID, PLASMA
Lactic Acid, Venous: 0.6 mmol/L (ref 0.5–1.9)
Lactic Acid, Venous: 0.8 mmol/L (ref 0.5–1.9)

## 2016-05-30 LAB — MRSA PCR SCREENING: MRSA by PCR: NEGATIVE

## 2016-05-30 MED ORDER — ADULT MULTIVITAMIN W/MINERALS CH
1.0000 | ORAL_TABLET | Freq: Every day | ORAL | Status: DC
  Administered 2016-05-30 – 2016-05-31 (×2): 1 via ORAL
  Filled 2016-05-30 (×2): qty 1

## 2016-05-30 MED ORDER — INSULIN ASPART 100 UNIT/ML ~~LOC~~ SOLN
0.0000 [IU] | Freq: Three times a day (TID) | SUBCUTANEOUS | Status: DC
  Administered 2016-05-31: 2 [IU] via SUBCUTANEOUS

## 2016-05-30 MED ORDER — INSULIN GLARGINE 100 UNIT/ML ~~LOC~~ SOLN
30.0000 [IU] | Freq: Every day | SUBCUTANEOUS | Status: DC
  Administered 2016-05-30 – 2016-05-31 (×2): 30 [IU] via SUBCUTANEOUS
  Filled 2016-05-30 (×2): qty 0.3

## 2016-05-30 MED ORDER — POTASSIUM CHLORIDE 2 MEQ/ML IV SOLN
30.0000 meq | Freq: Once | INTRAVENOUS | Status: AC
  Administered 2016-05-30: 30 meq via INTRAVENOUS
  Filled 2016-05-30: qty 15

## 2016-05-30 MED ORDER — INSULIN ASPART 100 UNIT/ML ~~LOC~~ SOLN
0.0000 [IU] | Freq: Every day | SUBCUTANEOUS | Status: DC
Start: 2016-05-30 — End: 2016-05-31

## 2016-05-30 NOTE — Progress Notes (Signed)
Inpatient Diabetes Program Recommendations  AACE/ADA: New Consensus Statement on Inpatient Glycemic Control (2015)  Target Ranges:  Prepandial:   less than 140 mg/dL      Peak postprandial:   less than 180 mg/dL (1-2 hours)      Critically ill patients:  140 - 180 mg/dL   Lab Results  Component Value Date   GLUCAP 125 (H) 05/30/2016   HGBA1C 13.9 (H) 03/10/2016    Review of Glycemic Control  Diabetes history: DM1 Outpatient Diabetes medications: Lantus 35 units QHS, Novolog 15 units tidwc Current orders for Inpatient glycemic control: Lantus 30 units QAM, IV insulin  Need Novolog orders and diet.  Inpatient Diabetes Program Recommendations:    Novolog sensitive tidwc and hs Novolog 6 units tidwc for meal coverage insulin if pt eats > 50% meal CHO mod diet  Pt interested in OP Diabetes Education. Will order.  Spoke with pt at length regarding his diabetes control. Discussed HgbA1C of 13.9% and goals. Pt states he has difficulty being consistent with insulin, monitoring and eating. Has lost weight in the last 6 months from being sick and not wanting to eat. Ran out of insulin on Saturday 1/6. [facility] and [facility]. States he should have called on Thursday to pick up on Friday, but thought he had more than he did. Wants to control blood sugars. Normally has no problems with getting insulin or strips for meter. States he needs to eat more consistently. Will continue to follow.  Thank you. [name] [name], RD, LDN, CDE Inpatient Diabetes Coordinator [phone]

## 2016-05-30 NOTE — Progress Notes (Signed)
PROGRESS NOTE    [name] [name]  ZOX:[id number] DOB: 04/14/1986 DOA: 05/29/2016 PCP: Lora PaulaFUNCHES, JOSALYN C, MD   Brief Narrative:  Patient admitted here for abdominal pain, nausea and vomiting. Ran out of his medications and found to be in DKA here.    Assessment & Plan:   Active Problems:   Dehydration   Hypokalemia   Uncontrolled type 1 diabetes mellitus with ketoacidosis without coma (HCC)   DKA, type 1, not at goal [facility](HCC)   Non-intractable vomiting with nausea  Diabetic ketoacidosis due to uncontrolled Dm1- med noncompliance - Resolved  -currently his anion gap has closed, he is off insulin drip -started on Lantus 35units daily and ISS -diabetic diet, antiemetics if needed  -transfer to medsurg floor  -educated regarding the importance of compliance to his regimen.  -no signs of infection. DKA precipitated solely due to noncompliance.   Hypokalemia  -likely from the insulin drip  -will replete K and recheck it again later today  -monitor for now.   THC abuse  -counseled to avoid using any illicit substance.   DVT prophylaxis: Lovenox sq Code Status: Full Family Communication:  None required, patient is coherent.  Disposition Plan: Transfer out of the stepdown   Consultants:   Diabetic Coordinator   Procedures:   None   Antimicrobials:   None    Subjective: No complaints at this time, he is feeling better and wants to eat regular diet.  He states he takes Lantus 45 units bedtimes and Novolog 15 units pre-meals at home but he ran out of his Lantus over the past weekend therefore did not take any. He does say he has rest of the supplies at home.  Objective: Vitals:   05/30/16 1000 05/30/16 1100 05/30/16 1111 05/30/16 1422  BP: (!) 166/107     Pulse:      Resp: 14 14    Temp:   98.2 F (36.8 C) 98.3 F (36.8 C)  TempSrc:   Oral Oral  SpO2: 100% 100%    Weight:      Height:        Intake/Output Summary (Last 24 hours) at 05/30/16 1447 Last data filed  at 05/30/16 0700  Gross per 24 hour  Intake          3139.03 ml  Output              300 ml  Net          2839.03 ml   Filed Weights   05/29/16 2114 05/29/16 2330  Weight: 52.2 kg (115 lb) 47.6 kg (104 lb 15 oz)    Examination:  General exam: Appears calm and comfortable  Respiratory system: Clear to auscultation. Respiratory effort normal. Cardiovascular system: S1 & S2 heard, RRR. No JVD, murmurs, rubs, gallops or clicks. No pedal edema. Gastrointestinal system: Abdomen is nondistended, soft and nontender. No organomegaly or masses felt. Normal bowel sounds heard. Central nervous system: Alert and oriented. No focal neurological deficits. Extremities: Symmetric 5 x 5 power. Skin: No rashes, lesions or ulcers Psychiatry: Judgement and insight appear normal. Mood & affect appropriate.     Data Reviewed:   CBC:  Recent Labs Lab 05/29/16 1857  WBC 7.8  HGB 16.7  HCT 45.0  MCV 81.8  PLT 325   Basic Metabolic Panel:  Recent Labs Lab 05/29/16 1857 05/29/16 2352 05/30/16 0355 05/30/16 0838  NA 136 136 134* 135  K 3.4* 3.4* 3.4* 2.9*  CL 100* 105 106 107  CO2 15* 12*  20* 20*  GLUCOSE 184* 270* 154* 107*  BUN 17 15 13 11   CREATININE 0.82 0.62 0.59* 0.43*  CALCIUM 9.3 8.0* 7.8* 7.9*   GFR: Estimated Creatinine Clearance: 89.3 mL/min (by C-G formula based on SCr of 0.43 mg/dL (L)). Liver Function Tests:  Recent Labs Lab 05/29/16 1857  AST 26  ALT 40  ALKPHOS 83  BILITOT 1.6*  PROT 8.4*  ALBUMIN 4.9    Recent Labs Lab 05/29/16 1857  LIPASE 34   No results for input(s): AMMONIA in the last 168 hours. Coagulation Profile: No results for input(s): INR, PROTIME in the last 168 hours. Cardiac Enzymes:  Recent Labs Lab 05/29/16 2352 05/30/16 0355  TROPONINI <0.03 <0.03   BNP (last 3 results) No results for input(s): PROBNP in the last 8760 hours. HbA1C: No results for input(s): HGBA1C in the last 72 hours. CBG:  Recent Labs Lab 05/30/16 0449  05/30/16 0538 05/30/16 0651 05/30/16 0834 05/30/16 0955  GLUCAP 143* 117* 120* 105* 125*   Lipid Profile: No results for input(s): CHOL, HDL, LDLCALC, TRIG, CHOLHDL, LDLDIRECT in the last 72 hours. Thyroid Function Tests: No results for input(s): TSH, T4TOTAL, FREET4, T3FREE, THYROIDAB in the last 72 hours. Anemia Panel: No results for input(s): VITAMINB12, FOLATE, FERRITIN, TIBC, IRON, RETICCTPCT in the last 72 hours. Sepsis Labs:  Recent Labs Lab 05/29/16 2007 05/29/16 2254 05/29/16 2352 05/30/16 0355  LATICACIDVEN 2.57* 0.73 0.8 0.6    Recent Results (from the past 240 hour(s))  MRSA PCR Screening     Status: None   Collection Time: 05/29/16 11:22 PM  Result Value Ref Range Status   MRSA by PCR NEGATIVE NEGATIVE Final    Comment:        The GeneXpert MRSA Assay (FDA approved for NASAL specimens only), is one component of a comprehensive MRSA colonization surveillance program. It is not intended to diagnose MRSA infection nor to guide or monitor treatment for MRSA infections.          Radiology Studies: Dg Chest 2 View  Result Date: 05/29/2016 CLINICAL DATA:  Shortness of breath and central chest pain. History of hypertension, diabetes. EXAM: CHEST  2 VIEW COMPARISON:  Chest radiograph December 18, 2015 FINDINGS: Cardiomediastinal silhouette is normal. No pleural effusions or focal consolidations. Trachea projects midline and there is no pneumothorax. Soft tissue planes and included osseous structures are non-suspicious. IMPRESSION: Normal chest. Electronically Signed   By: Awilda Metro M.D.   On: 05/29/2016 18:49        Scheduled Meds: . enoxaparin (LOVENOX) injection  40 mg Subcutaneous QHS  . insulin glargine  30 Units Subcutaneous Daily  . multivitamin with minerals  1 tablet Oral Daily  . potassium chloride (KCL MULTIRUN) 30 mEq in 265 mL IVPB  30 mEq Intravenous Once   Continuous Infusions:   LOS: 0 days    Time spent: 35 mins     Aldina Porta  Joline Maxcy, MD [facility] Pager [phone]   If 7PM-7AM, please contact night-coverage [email or web address] Password [email or web address] 05/30/2016, 2:47 PM

## 2016-05-30 NOTE — Progress Notes (Signed)
Initial Nutrition Assessment  DOCUMENTATION CODES:   Severe malnutrition in context of chronic illness, Underweight  INTERVENTION:  - Diet advancement as medically feasible. - RD will order oral nutrition supplements with diet advancement. - Will order daily multivitamin with minerals.   NUTRITION DIAGNOSIS:   Inadequate oral intake related to inability to eat as evidenced by NPO status.  GOAL:   Patient will meet greater than or equal to 90% of their needs  MONITOR:   Diet advancement, Weight trends, Labs, I & O's  REASON FOR ASSESSMENT:   Malnutrition Screening Tool  ASSESSMENT:   31 y.o. male with medical history significant of DM 1 poorly controlled. Recurrent DKA, polysubstance abuse. Presented with nausea and vomiting for the past 2 days patient's blood glucose meter read as high and he called EMS. He reports he did not take his insulin last night. EMS arrival blood sugar was 255. Patient also endorsing chest pain that started since this afternoon.  Pt seen for MST. BMI indicates underweight status. Pt has been NPO since admission. He reports N/V/abdominal pain began on Saturday (1/6) and denies N/V since admission but states that abdominal pain has been intermittently occurring since admission. He is unable to recall for sure when he last ate but he thinks it may have been Saturday before onset of symptoms. Pt seems to provide information that he believes RD wants to hear. He initially states that he eats 3 meals and 1-2 snacks/day but with more digging it was found that pt often goes periods without eating at all; he is unable to give an average length of time that this behavior occurs.   He states that he checks CBGs before each time he eats and before going to bed and states that readings are typically 200-300 mg/dL.   Physical assessment shows moderate and severe muscle and moderate and severe fat wasting. No edema present and pt denies edema PTA. Per chart review, pt  has lost 14 lbs (12% body weight) since September which is significant for time frame.   Medications reviewed; 30 units Lantus/day, PRN IV Zofran, 10 mEq IV KCl x3 runs yesterday.  Labs reviewed; CBGs: 105-269 mg/dL, K: 2.9 mmol/L, creatinine: 0.45 mg/dL, Ca: 7.6 mg/dL.  IVF: D5-1/2 NS @ 125 mL/hr (510 kcal).  Drip: 0.5 units insulin/hr.    Diet Order:  Diet NPO time specified  Skin:  Reviewed, no issues  Last BM:  1/6 (PTA)  Height:   Ht Readings from Last 1 Encounters:  05/29/16 5\' 6"  (1.676 m)    Weight:   Wt Readings from Last 1 Encounters:  05/29/16 104 lb 15 oz (47.6 kg)    Ideal Body Weight:  64.54 kg  BMI:  Body mass index is 16.94 kg/m.  Estimated Nutritional Needs:   Kcal:  1430-1665 (30-35 kcal/kg)  Protein:  57-67 grams (1.2-1.4 grams/kg)  Fluid:  >/= 1.5 L/day  EDUCATION NEEDS:   No education needs identified at this time    Trenton GammonJessica Shaneece Stockburger, MS, RD, LDN, CNSC Inpatient Clinical Dietitian Pager # [phone] After hours/weekend pager # [phone]

## 2016-05-31 LAB — BASIC METABOLIC PANEL
Anion gap: 6 (ref 5–15)
BUN: 8 mg/dL (ref 6–20)
CO2: 24 mmol/L (ref 22–32)
Calcium: 8.6 mg/dL — ABNORMAL LOW (ref 8.9–10.3)
Chloride: 107 mmol/L (ref 101–111)
Creatinine, Ser: 0.43 mg/dL — ABNORMAL LOW (ref 0.61–1.24)
GFR calc Af Amer: >60 mL/min (ref >60)
GFR calc non Af Amer: >60 mL/min (ref >60)
Glucose, Bld: 101 mg/dL — ABNORMAL HIGH (ref 65–99)
Potassium: 3.6 mmol/L (ref 3.5–5.1)
Sodium: 137 mmol/L (ref 135–145)

## 2016-05-31 LAB — MAGNESIUM: Magnesium: 2 mg/dL (ref 1.7–2.4)

## 2016-05-31 LAB — GLUCOSE, CAPILLARY
GLUCOSE-CAPILLARY: 94 mg/dL (ref 65–99)
GLUCOSE-CAPILLARY: 190 mg/dL — AB (ref 65–99)
Glucose-Capillary: 152 mg/dL — ABNORMAL HIGH (ref 65–99)

## 2016-05-31 LAB — HEMOGLOBIN A1C
HEMOGLOBIN A1C: 14.1 % — AB (ref 4.8–5.6)
MEAN PLASMA GLUCOSE: 358 mg/dL

## 2016-05-31 LAB — TROPONIN I: Troponin I: <0.03 ng/mL (ref <0.03)

## 2016-05-31 MED ORDER — POTASSIUM CHLORIDE 20 MEQ/15ML (10%) PO SOLN
40.0000 meq | Freq: Once | ORAL | Status: AC
  Administered 2016-05-31: 40 meq via ORAL
  Filled 2016-05-31: qty 30

## 2016-05-31 MED ORDER — INSULIN GLARGINE 100 UNIT/ML SOLOSTAR PEN: 45.0000 [IU] | PEN_INJECTOR | Freq: Every day | SUBCUTANEOUS | 3 refills | Status: DC

## 2016-05-31 MED ORDER — POTASSIUM CHLORIDE CRYS ER 20 MEQ PO TBCR
40.0000 meq | EXTENDED_RELEASE_TABLET | Freq: Once | ORAL | Status: AC
  Administered 2016-05-31: 40 meq via ORAL
  Filled 2016-05-31: qty 2

## 2016-05-31 MED FILL — LANTUS SOLOSTAR 100 UNITS/M: 100 | 13 days supply | Qty: 6 | Fill #0

## 2016-05-31 NOTE — Discharge Instructions (Signed)
Diabetes Mellitus and Standards of Medical Care Managing diabetes (diabetes mellitus) can be complicated. Your diabetes treatment may be managed by a team of health care providers, including:  A diet and nutrition specialist (registered dietitian).  A nurse.  A certified diabetes educator (CDE).  A diabetes specialist (endocrinologist).  An eye doctor.  A primary care provider.  A dentist. Your health care providers follow a schedule in order to help you get the best quality of care. The following schedule is a general guideline for your diabetes management plan. Your health care providers may also give you more specific instructions. HbA1c ( hemoglobin A1c) test This test provides information about blood sugar (glucose) control over the previous 2-3 months. It is used to check whether your diabetes management plan needs to be adjusted.  If you are meeting your treatment goals, this test is done at least 2 times a year.  If you are not meeting treatment goals or if your treatment goals have changed, this test is done 4 times a year. Blood pressure test  This test is done at every routine medical visit. For most people, the goal is less than 140/90. In some cases, your goal blood pressure may be 130/80 or less. Ask your health care provider what your goal blood pressure should be. Dental and eye exams  Visit your dentist two times a year.  If you have type 1 diabetes, get an eye exam 3-5 years after you are diagnosed, and then once a year after your first exam.  If you were diagnosed with type 1 diabetes as a child, get an eye exam when you are age 33 or older and have had diabetes for 3-5 years. After the first exam, you should get an eye exam once a year.  If you have type 2 diabetes, have an eye exam as soon as you are diagnosed, and then once a year after your first exam. Foot care exam  Visual foot exams are done at every routine medical visit. The exams check for cuts,  bruises, redness, blisters, sores, or other problems with the feet.  A complete foot exam is done by your health care provider once a year. This exam includes an inspection of the structure and skin of your feet, and a check of the pulses and sensation in your feet.  Type 1 diabetes: Get your first exam 3-5 years after diagnosis.  Type 2 diabetes: Get your first exam as soon as you are diagnosed.  Check your feet every day for cuts, bruises, redness, blisters, or sores. If you have any of these or other problems that are not healing, contact your health care provider. Kidney function test ( urine microalbumin)  This test is done once a year.  Type 1 diabetes: Get your first test 5 years after diagnosis.  Type 2 diabetes: Get your first test as soon as you are diagnosed.  If you have chronic kidney disease (CKD), get a serum creatinine and estimated glomerular filtration rate (eGFR) test once a year. Lipid profile (cholesterol, HDL, LDL, triglycerides)  This test should be done when you are diagnosed with diabetes, and every 5 years after the first test. If you are on medicines to lower your cholesterol, you may need to get this test done every year.  The goal for LDL is less than 100 mg/dL (5.5 mmol/L). If you are at high risk, the goal is less than 70 mg/dL (3.9 mmol/L).  The goal for HDL is 40 mg/dL (2.2 mmol/L)  for men and 50 mg/dL(2.8 mmol/L) for women. An HDL cholesterol of 60 mg/dL (3.3 mmol/L) or higher gives some protection against heart disease.  The goal for triglycerides is less than 150 mg/dL (8.3 mmol/L). Immunizations  The yearly flu (influenza) vaccine is recommended for everyone 6 months or older who has diabetes.  The pneumonia (pneumococcal) vaccine is recommended for everyone 2 years or older who has diabetes. If you are 33 or older, you may get the pneumonia vaccine as a series of two separate shots.  The hepatitis B vaccine is recommended for adults shortly  after they have been diagnosed with diabetes.  The Tdap (tetanus, diphtheria, and pertussis) vaccine should be given:  According to normal childhood vaccination schedules, for children.  Every 10 years, for adults who have diabetes.  The shingles vaccine is recommended for people who have had chicken pox and are 33 years or older. Mental and emotional health  Screening for symptoms of eating disorders, anxiety, and depression is recommended at the time of diagnosis and afterward as needed. If your screening shows that you have symptoms (you have a positive screening result), you may need further evaluation and be referred to a mental health care provider. Diabetes self-management education  Education about how to manage your diabetes is recommended at diagnosis and ongoing as needed. Treatment plan  Your treatment plan will be reviewed at every medical visit. Summary  Managing diabetes (diabetes mellitus) can be complicated. Your diabetes treatment may be managed by a team of health care providers.  Your health care providers follow a schedule in order to help you get the best quality of care.  Standards of care including having regular physical exams, blood tests, blood pressure monitoring, immunizations, screening tests, and education about how to manage your diabetes.  Your health care providers may also give you more specific instructions based on your individual health. This information is not intended to replace advice given to you by your health care provider. Make sure you discuss any questions you have with your health care provider. Document Released: 03/05/2009 Document Revised: 02/04/2016 Document Reviewed: 02/04/2016 Elsevier Interactive Patient Education  2017 [employer].

## 2016-05-31 NOTE — Discharge Summary (Signed)
Physician Discharge Summary  [name] [name] QIH:[id number] DOB: [birth year] DOA: 05/29/2016  PCP: Minerva Ends, MD  Admit date: 05/29/2016 Discharge date: 05/31/2016  Admitted From: Home Disposition:  Home  Recommendations for Outpatient Follow-up:  1. Follow up with PCP in 1  Home Health:None Equipment/Devices: None   Discharge Condition: Stable  CODE STATUS: Full  Diet recommendation: Diabetic Diet   Brief/Interim Summary Patient admitted on the 05/29/16 with complaints of nausea and vomiting. He was found to be in DKA therefore admitted to the stepdown unit on insulin drip. He states he had ran out of his lantus about 5 days ago and had only been taking Premeal Novolog. He was also noted to be hypokalemic therefore aggressively repleted.  He started feeling well over the course of couple of days as his anion gap closed. He was transition to his home regimen. His A1c is noted to be 14.1.  Today he is medically stable as his anion gap as closed, he is tolerating po diet, and doesn't have any complaints. He can discharged as he has reached his maximum benefit from inpatient stay. I have advised him to stay complaint with his diet and medications. He was also educated by the diabetic coordinator.  I have repleted his potassium and refilled his Lantus, he has rest of the medications at home along with other diabetic supplies.   Discharge Diagnoses:  Active Problems:   Dehydration   Hypokalemia   Uncontrolled type 1 diabetes mellitus with ketoacidosis without coma ([place])   DKA, type 1, not at goal [facility])   Non-intractable vomiting with nausea  Diabetic ketoacidosis due to uncontrolled Dm1- med noncompliance - Resolved  -currently his anion gap has closed, he is off insulin drip -restart home regimen of Lantus and Sliding scale. Lantus prescription refilled.  -diabetic diet, antiemetics if needed  -educated regarding the importance of compliance to his regimen.  -no signs of infection.  DKA precipitated solely due to noncompliance.   Hypokalemia - improved   THC abuse  -counseled to avoid using any illicit substance.   Discharge Instructions   Allergies as of 05/31/2016   No Known Allergies     Medication List    TAKE these medications   glucose blood test strip Commonly known as:  TRUE METRIX BLOOD GLUCOSE TEST 1 each by Other route 3 (three) times daily.   insulin aspart 100 UNIT/ML FlexPen Commonly known as:  NOVOLOG 15 U three times daily with meals and 5 U up to twice daily with snacks What changed:  how much to take  how to take this  when to take this  additional instructions   Insulin Glargine 100 UNIT/ML Solostar Pen Commonly known as:  LANTUS Inject 45 Units into the skin daily at 10 pm. Start taking on:  06/01/2016 What changed:  how much to take   Insulin Syringe-Needle U-100 31G X 5/16" 0.5 ML Misc Commonly known as:  B-D INS SYRINGE 0.5CC/31GX5/16 1 each by Does not apply route 2 (two) times daily.   TRUE METRIX METER w/Device Kit 1 each by Does not apply route as needed.   TRUEPLUS LANCETS 28G Misc 1 each by Does not apply route 3 (three) times daily.      Follow-up Information    Minerva Ends, MD. Schedule an appointment as soon as possible for a visit in 1 week(s).   Specialty:  Family Medicine Contact information: [place] [address] [phone]  No Known Allergies  Consultations:  Diabetic Coordinator.    Procedures/Studies: Dg Chest 2 View  Result Date: 05/29/2016 CLINICAL DATA:  Shortness of breath and central chest pain. History of hypertension, diabetes. EXAM: CHEST  2 VIEW COMPARISON:  Chest radiograph December 18, 2015 FINDINGS: Cardiomediastinal silhouette is normal. No pleural effusions or focal consolidations. Trachea projects midline and there is no pneumothorax. Soft tissue planes and included osseous structures are non-suspicious. IMPRESSION: Normal chest.  Electronically Signed   By: Elon Alas M.D.   On: 05/29/2016 18:49       Subjective:   Discharge Exam: Vitals:   05/31/16 0400 05/31/16 0537  BP: (!) 154/122 (!) 142/103  Pulse:  87  Resp: 14 14  Temp:  99.1 F (37.3 C)   Vitals:   05/31/16 0000 05/31/16 0333 05/31/16 0400 05/31/16 0537  BP: (!) 133/93  (!) 154/122 (!) 142/103  Pulse:    87  Resp: '15  14 14  '$ Temp:  98.2 F (36.8 C)  99.1 F (37.3 C)  TempSrc:  Oral  Oral  SpO2: 100%  100% 100%  Weight:      Height:        General: Pt is alert, awake, not in acute distress Cardiovascular: RRR, S1/S2 +, no rubs, no gallops Respiratory: CTA bilaterally, no wheezing, no rhonchi Abdominal: Soft, NT, ND, bowel sounds + Extremities: no edema, no cyanosis    The results of significant diagnostics from this hospitalization (including imaging, microbiology, ancillary and laboratory) are listed below for reference.     Microbiology: Recent Results (from the past 240 hour(s))  MRSA PCR Screening     Status: None   Collection Time: 05/29/16 11:22 PM  Result Value Ref Range Status   MRSA by PCR NEGATIVE NEGATIVE Final    Comment:        The GeneXpert MRSA Assay (FDA approved for NASAL specimens only), is one component of a comprehensive MRSA colonization surveillance program. It is not intended to diagnose MRSA infection nor to guide or monitor treatment for MRSA infections.      Labs: BNP (last 3 results) No results for input(s): BNP in the last 8760 hours. Basic Metabolic Panel:  Recent Labs Lab 05/29/16 2352 05/30/16 0355 05/30/16 0838 05/30/16 1427 05/30/16 2246 05/31/16 0520  NA 136 134* 135 133*  133*  --  137  K 3.4* 3.4* 2.9* 2.8*  2.8* 2.7* 3.6  CL 105 106 107 105  105  --  107  CO2 12* 20* 20* 21*  20*  --  24  GLUCOSE 270* 154* 107* 147*  143*  --  101*  BUN '15 13 11 10  11  '$ --  8  CREATININE 0.62 0.59* 0.43* 0.51*  0.44*  --  0.43*  CALCIUM 8.0* 7.8* 7.9* 7.8*  7.8*  --   8.6*  MG  --   --   --   --   --  2.0   Liver Function Tests:  Recent Labs Lab 05/29/16 1857  AST 26  ALT 40  ALKPHOS 83  BILITOT 1.6*  PROT 8.4*  ALBUMIN 4.9    Recent Labs Lab 05/29/16 1857  LIPASE 34   No results for input(s): AMMONIA in the last 168 hours. CBC:  Recent Labs Lab 05/29/16 1857  WBC 7.8  HGB 16.7  HCT 45.0  MCV 81.8  PLT 325   Cardiac Enzymes:  Recent Labs Lab 05/30/16 0355 05/30/16 1427 05/30/16 1703 05/30/16 2246 05/31/16 0520  TROPONINI <0.03 <  0.03 <0.03 <0.03 <0.03   BNP: Invalid input(s): POCBNP CBG:  Recent Labs Lab 05/30/16 1110 05/30/16 1257 05/30/16 1624 05/30/16 2053 05/31/16 0748  GLUCAP 105* 124* 95 152* 94   D-Dimer No results for input(s): DDIMER in the last 72 hours. Hgb A1c  Recent Labs  05/30/16 0355  HGBA1C 14.1*   Lipid Profile No results for input(s): CHOL, HDL, LDLCALC, TRIG, CHOLHDL, LDLDIRECT in the last 72 hours. Thyroid function studies No results for input(s): TSH, T4TOTAL, T3FREE, THYROIDAB in the last 72 hours.  Invalid input(s): FREET3 Anemia work up No results for input(s): VITAMINB12, FOLATE, FERRITIN, TIBC, IRON, RETICCTPCT in the last 72 hours. Urinalysis    Component Value Date/Time   COLORURINE YELLOW 05/30/2016 0150   APPEARANCEUR CLEAR 05/30/2016 0150   LABSPEC 1.029 05/30/2016 0150   PHURINE 5.0 05/30/2016 0150   GLUCOSEU >=500 (A) 05/30/2016 0150   HGBUR NEGATIVE 05/30/2016 0150   BILIRUBINUR NEGATIVE 05/30/2016 0150   BILIRUBINUR Negative 02/07/2016 1528   KETONESUR 80 (A) 05/30/2016 0150   PROTEINUR 30 (A) 05/30/2016 0150   UROBILINOGEN 0.2 02/07/2016 1528   UROBILINOGEN 0.2 03/22/2015 0913   NITRITE NEGATIVE 05/30/2016 0150   LEUKOCYTESUR NEGATIVE 05/30/2016 0150   Sepsis Labs Invalid input(s): PROCALCITONIN,  WBC,  LACTICIDVEN Microbiology Recent Results (from the past 240 hour(s))  MRSA PCR Screening     Status: None   Collection Time: 05/29/16 11:22 PM   Result Value Ref Range Status   MRSA by PCR NEGATIVE NEGATIVE Final    Comment:        The GeneXpert MRSA Assay (FDA approved for NASAL specimens only), is one component of a comprehensive MRSA colonization surveillance program. It is not intended to diagnose MRSA infection nor to guide or monitor treatment for MRSA infections.      Time coordinating discharge: Over 30 minutes  SIGNED:   Damita Lack, MD  [facility] 05/31/2016, 9:51 AM Pager   If 7PM-7AM, please contact night-coverage [email or web address] Password [email or web address]

## 2016-05-31 NOTE — Progress Notes (Signed)
PROGRESS NOTE    [name] [name]  ZOX:[id number] DOB: [birth year] DOA: 05/29/2016 PCP: Lora PaulaFUNCHES, [name] C, MD   Brief Narrative:  Patient admitted here for abdominal pain, nausea and vomiting. Ran out of his medications and found to be in DKA here. Doing better today and tolerating po diet.    Assessment & Plan:   Active Problems:   Dehydration   Hypokalemia   Uncontrolled type 1 diabetes mellitus with ketoacidosis without coma (HCC)   DKA, type 1, not at goal [facility](HCC)   Non-intractable vomiting with nausea  Diabetic ketoacidosis due to uncontrolled Dm1- med noncompliance - Resolved  -currently his anion gap has closed, he is off insulin drip -started on Lantus 35units daily and ISS -diabetic diet, antiemetics if needed  -educated regarding the importance of compliance to his regimen.  -no signs of infection. DKA precipitated solely due to noncompliance.   Hypokalemia - improved  -will give one dose of po K Cl today prior to discharge.   THC abuse  -counseled to avoid using any illicit substance.   DVT prophylaxis: Lovenox sq Code Status: Full Family Communication:  None required, patient is coherent.  Disposition Plan:  Discharge today   Consultants:   Diabetic Coordinator   Procedures:   None   Antimicrobials:   None    Subjective: No complaints at this time, he is feeling better and tolerating po diet.   Objective: Vitals:   05/31/16 0000 05/31/16 0333 05/31/16 0400 05/31/16 0537  BP: (!) 133/93  (!) 154/122 (!) 142/103  Pulse:    87  Resp: 15  14 14   Temp:  98.2 F (36.8 C)  99.1 F (37.3 C)  TempSrc:  Oral  Oral  SpO2: 100%  100% 100%  Weight:      Height:        Intake/Output Summary (Last 24 hours) at 05/31/16 0943 Last data filed at 05/31/16 0121  Gross per 24 hour  Intake              740 ml  Output             1400 ml  Net             -660 ml   Filed Weights   05/29/16 2114 05/29/16 2330  Weight: 52.2 kg (115 lb) 47.6 kg (104 lb 15 oz)      Examination:  General exam: Appears calm and comfortable  Respiratory system: Clear to auscultation. Respiratory effort normal. Cardiovascular system: S1 & S2 heard, RRR. No JVD, murmurs, rubs, gallops or clicks. No pedal edema. Gastrointestinal system: Abdomen is nondistended, soft and nontender. No organomegaly or masses felt. Normal bowel sounds heard. Central nervous system: Alert and oriented. No focal neurological deficits. Extremities: Symmetric 5 x 5 power. Skin: No rashes, lesions or ulcers Psychiatry: Judgement and insight appear normal. Mood & affect appropriate.     Data Reviewed:   CBC:  Recent Labs Lab 05/29/16 1857  WBC 7.8  HGB 16.7  HCT 45.0  MCV 81.8  PLT 325   Basic Metabolic Panel:  Recent Labs Lab 05/29/16 2352 05/30/16 0355 05/30/16 0838 05/30/16 1427 05/30/16 2246 05/31/16 0520  NA 136 134* 135 133*  133*  --  137  K 3.4* 3.4* 2.9* 2.8*  2.8* 2.7* 3.6  CL 105 106 107 105  105  --  107  CO2 12* 20* 20* 21*  20*  --  24  GLUCOSE 270* 154* 107* 147*  143*  --  101*  BUN 15 13 11 10  11   --  8  CREATININE 0.62 0.59* 0.43* 0.51*  0.44*  --  0.43*  CALCIUM 8.0* 7.8* 7.9* 7.8*  7.8*  --  8.6*  MG  --   --   --   --   --  2.0   GFR: Estimated Creatinine Clearance: 89.3 mL/min (by C-G formula based on SCr of 0.43 mg/dL (L)). Liver Function Tests:  Recent Labs Lab 05/29/16 1857  AST 26  ALT 40  ALKPHOS 83  BILITOT 1.6*  PROT 8.4*  ALBUMIN 4.9    Recent Labs Lab 05/29/16 1857  LIPASE 34   No results for input(s): AMMONIA in the last 168 hours. Coagulation Profile: No results for input(s): INR, PROTIME in the last 168 hours. Cardiac Enzymes:  Recent Labs Lab 05/30/16 0355 05/30/16 1427 05/30/16 1703 05/30/16 2246 05/31/16 0520  TROPONINI <0.03 <0.03 <0.03 <0.03 <0.03   BNP (last 3 results) No results for input(s): PROBNP in the last 8760 hours. HbA1C:  Recent Labs  05/30/16 0355  HGBA1C 14.1*    CBG:  Recent Labs Lab 05/30/16 1110 05/30/16 1257 05/30/16 1624 05/30/16 2053 05/31/16 0748  GLUCAP 105* 124* 95 152* 94   Lipid Profile: No results for input(s): CHOL, HDL, LDLCALC, TRIG, CHOLHDL, LDLDIRECT in the last 72 hours. Thyroid Function Tests: No results for input(s): TSH, T4TOTAL, FREET4, T3FREE, THYROIDAB in the last 72 hours. Anemia Panel: No results for input(s): VITAMINB12, FOLATE, FERRITIN, TIBC, IRON, RETICCTPCT in the last 72 hours. Sepsis Labs:  Recent Labs Lab 05/29/16 2007 05/29/16 2254 05/29/16 2352 05/30/16 0355  LATICACIDVEN 2.57* 0.73 0.8 0.6    Recent Results (from the past 240 hour(s))  MRSA PCR Screening     Status: None   Collection Time: 05/29/16 11:22 PM  Result Value Ref Range Status   MRSA by PCR NEGATIVE NEGATIVE Final    Comment:        The GeneXpert MRSA Assay (FDA approved for NASAL specimens only), is one component of a comprehensive MRSA colonization surveillance program. It is not intended to diagnose MRSA infection nor to guide or monitor treatment for MRSA infections.          Radiology Studies: Dg Chest 2 View  Result Date: 05/29/2016 CLINICAL DATA:  Shortness of breath and central chest pain. History of hypertension, diabetes. EXAM: CHEST  2 VIEW COMPARISON:  Chest radiograph December 18, 2015 FINDINGS: Cardiomediastinal silhouette is normal. No pleural effusions or focal consolidations. Trachea projects midline and there is no pneumothorax. Soft tissue planes and included osseous structures are non-suspicious. IMPRESSION: Normal chest. Electronically Signed   By: Awilda Metro M.D.   On: 05/29/2016 18:49        Scheduled Meds: . enoxaparin (LOVENOX) injection  40 mg Subcutaneous QHS  . insulin aspart  0-5 Units Subcutaneous QHS  . insulin aspart  0-9 Units Subcutaneous TID WC  . insulin glargine  30 Units Subcutaneous Daily  . multivitamin with minerals  1 tablet Oral Daily   Continuous Infusions:    LOS: 1 day    Time spent: 35 mins     Jaheem Hedgepath Joline Maxcy, MD [facility] Pager [phone]   If 7PM-7AM, please contact night-coverage [email or web address] Password [facility] 05/31/2016, 9:43 AM

## 2016-05-31 NOTE — Progress Notes (Signed)
CRITICAL VALUE ALERT  Critical value received:  K 2.7  Date of notification:  05/30/2016  Time of notification:  2345  Critical value read back:Yes.    Nurse who received alert:  Loletta ParishJessica Tymika Grilli RN  MD notified (1st page):  Tama GanderKatherine Schorr NP  Time of first page:  2350  MD notified (2nd page): Tama GanderKatherine Schorr NP  Time of second page: 0030  Responding MD:  Tama GanderKatherine Schorr NP  Time MD responded:  [phone]

## 2016-07-11 ENCOUNTER — Encounter (HOSPITAL_COMMUNITY): Payer: Self-pay

## 2016-07-11 ENCOUNTER — Observation Stay (HOSPITAL_COMMUNITY)
Admission: EM | Admit: 2016-07-11 | Discharge: 2016-07-12 | Disposition: A | Discharge status: Home / Self Care | Payer: Self-pay | Attending: Internal Medicine | Admitting: Internal Medicine

## 2016-07-11 ENCOUNTER — Emergency Department (HOSPITAL_COMMUNITY): Payer: Self-pay

## 2016-07-11 DIAGNOSIS — Z794 Long term (current) use of insulin: Secondary | ICD-10-CM | POA: Insufficient documentation

## 2016-07-11 DIAGNOSIS — N179 Acute kidney failure, unspecified: Secondary | ICD-10-CM

## 2016-07-11 DIAGNOSIS — R079 Chest pain, unspecified: Secondary | ICD-10-CM | POA: Diagnosis present

## 2016-07-11 DIAGNOSIS — R0789 Other chest pain: Secondary | ICD-10-CM | POA: Insufficient documentation

## 2016-07-11 DIAGNOSIS — Z87891 Personal history of nicotine dependence: Secondary | ICD-10-CM | POA: Insufficient documentation

## 2016-07-11 DIAGNOSIS — E1010 Type 1 diabetes mellitus with ketoacidosis without coma: Principal | ICD-10-CM | POA: Insufficient documentation

## 2016-07-11 DIAGNOSIS — E43 Unspecified severe protein-calorie malnutrition: Secondary | ICD-10-CM | POA: Insufficient documentation

## 2016-07-11 DIAGNOSIS — Z9111 Patient's noncompliance with dietary regimen: Secondary | ICD-10-CM | POA: Insufficient documentation

## 2016-07-11 DIAGNOSIS — Z681 Body mass index (BMI) 19 or less, adult: Secondary | ICD-10-CM | POA: Insufficient documentation

## 2016-07-11 DIAGNOSIS — E876 Hypokalemia: Secondary | ICD-10-CM | POA: Insufficient documentation

## 2016-07-11 DIAGNOSIS — E860 Dehydration: Secondary | ICD-10-CM | POA: Insufficient documentation

## 2016-07-11 DIAGNOSIS — Z9114 Patient's other noncompliance with medication regimen: Secondary | ICD-10-CM | POA: Insufficient documentation

## 2016-07-11 LAB — URINALYSIS, ROUTINE W REFLEX MICROSCOPIC
BILIRUBIN URINE: NEGATIVE
Bacteria, UA: NONE SEEN
Glucose, UA: >=500 mg/dL — AB
HGB URINE DIPSTICK: NEGATIVE
Ketones, ur: 80 mg/dL — AB
LEUKOCYTES UA: NEGATIVE
Nitrite: NEGATIVE
PH: 5 (ref 5.0–8.0)
Protein, ur: 100 mg/dL — AB
Specific Gravity, Urine: 1.023 (ref 1.005–1.030)

## 2016-07-11 LAB — BASIC METABOLIC PANEL
ANION GAP: 22 — AB (ref 5–15)
ANION GAP: 10 (ref 5–15)
Anion gap: 19 — ABNORMAL HIGH (ref 5–15)
BUN: 21 mg/dL — ABNORMAL HIGH (ref 6–20)
BUN: 22 mg/dL — AB (ref 6–20)
BUN: 20 mg/dL (ref 6–20)
CALCIUM: 9.4 mg/dL (ref 8.9–10.3)
CHLORIDE: 105 mmol/L (ref 101–111)
CO2: 13 mmol/L — ABNORMAL LOW (ref 22–32)
CO2: 16 mmol/L — ABNORMAL LOW (ref 22–32)
CO2: 21 mmol/L — ABNORMAL LOW (ref 22–32)
CREATININE: 1.27 mg/dL — AB (ref 0.61–1.24)
CREATININE: 1.21 mg/dL (ref 0.61–1.24)
Calcium: 9.6 mg/dL (ref 8.9–10.3)
Calcium: 8.4 mg/dL — ABNORMAL LOW (ref 8.9–10.3)
Chloride: 102 mmol/L (ref 101–111)
Chloride: 113 mmol/L — ABNORMAL HIGH (ref 101–111)
Creatinine, Ser: 0.88 mg/dL (ref 0.61–1.24)
GFR calc Af Amer: >60 mL/min (ref >60)
GFR calc non Af Amer: >60 mL/min (ref >60)
GLUCOSE: 308 mg/dL — AB (ref 65–99)
Glucose, Bld: 400 mg/dL — ABNORMAL HIGH (ref 65–99)
Glucose, Bld: 156 mg/dL — ABNORMAL HIGH (ref 65–99)
POTASSIUM: 3.8 mmol/L (ref 3.5–5.1)
Potassium: 3.9 mmol/L (ref 3.5–5.1)
Potassium: 4.1 mmol/L (ref 3.5–5.1)
SODIUM: 140 mmol/L (ref 135–145)
Sodium: 137 mmol/L (ref 135–145)
Sodium: 144 mmol/L (ref 135–145)

## 2016-07-11 LAB — CBC
HCT: 45.4 % (ref 39.0–52.0)
HEMOGLOBIN: 15.6 g/dL (ref 13.0–17.0)
MCH: 30.5 pg (ref 26.0–34.0)
MCHC: 34.4 g/dL (ref 30.0–36.0)
MCV: 88.8 fL (ref 78.0–100.0)
Platelets: 309 10*3/uL (ref 150–400)
RBC: 5.11 MIL/uL (ref 4.22–5.81)
RDW: 13.4 % (ref 11.5–15.5)
WBC: 15.1 10*3/uL — ABNORMAL HIGH (ref 4.0–10.5)

## 2016-07-11 LAB — I-STAT VENOUS BLOOD GAS, ED
Acid-base deficit: 9 mmol/L — ABNORMAL HIGH (ref 0.0–2.0)
BICARBONATE: 16 mmol/L — AB (ref 20.0–28.0)
O2 Saturation: 72 %
PH VEN: 7.301 (ref 7.250–7.430)
PO2 VEN: 41 mmHg (ref 32.0–45.0)
TCO2: 17 mmol/L (ref 0–100)
pCO2, Ven: 32.4 mmHg — ABNORMAL LOW (ref 44.0–60.0)

## 2016-07-11 LAB — CBG MONITORING, ED
GLUCOSE-CAPILLARY: 465 mg/dL — AB (ref 65–99)
GLUCOSE-CAPILLARY: 312 mg/dL — AB (ref 65–99)
GLUCOSE-CAPILLARY: 161 mg/dL — AB (ref 65–99)
Glucose-Capillary: 180 mg/dL — ABNORMAL HIGH (ref 65–99)

## 2016-07-11 LAB — GLUCOSE, CAPILLARY
GLUCOSE-CAPILLARY: 110 mg/dL — AB (ref 65–99)
GLUCOSE-CAPILLARY: 128 mg/dL — AB (ref 65–99)

## 2016-07-11 LAB — HEPATIC FUNCTION PANEL
ALBUMIN: 3.6 g/dL (ref 3.5–5.0)
ALT: 32 U/L (ref 17–63)
AST: 24 U/L (ref 15–41)
Alkaline Phosphatase: 63 U/L (ref 38–126)
Bilirubin, Direct: 0.1 mg/dL (ref 0.1–0.5)
Indirect Bilirubin: 1 mg/dL — ABNORMAL HIGH (ref 0.3–0.9)
TOTAL PROTEIN: 6.5 g/dL (ref 6.5–8.1)
Total Bilirubin: 1.1 mg/dL (ref 0.3–1.2)

## 2016-07-11 LAB — TROPONIN I: Troponin I: <0.03 ng/mL (ref <0.03)

## 2016-07-11 LAB — LIPASE, BLOOD: Lipase: 44 U/L (ref 11–51)

## 2016-07-11 LAB — BETA-HYDROXYBUTYRIC ACID: BETA-HYDROXYBUTYRIC ACID: 5.45 mmol/L — AB (ref 0.05–0.27)

## 2016-07-11 MED ORDER — DEXTROSE-NACL 5-0.45 % IV SOLN
INTRAVENOUS | Status: DC
  Administered 2016-07-11: 23:00:00 via INTRAVENOUS

## 2016-07-11 MED ORDER — MORPHINE SULFATE (PF) 2 MG/ML IV SOLN: 2.0000 mg | INTRAVENOUS | Status: DC | PRN

## 2016-07-11 MED ORDER — FAMOTIDINE 20 MG PO TABS
20.0000 mg | ORAL_TABLET | Freq: Every day | ORAL | Status: DC
  Administered 2016-07-11: 20 mg via ORAL
  Filled 2016-07-11: qty 1

## 2016-07-11 MED ORDER — GI COCKTAIL ~~LOC~~
30.0000 mL | Freq: Once | ORAL | Status: AC
  Administered 2016-07-11: 30 mL via ORAL
  Filled 2016-07-11: qty 30

## 2016-07-11 MED ORDER — SODIUM CHLORIDE 0.9 % IV BOLUS (SEPSIS)
1000.0000 mL | Freq: Once | INTRAVENOUS | Status: AC
  Administered 2016-07-11: 1000 mL via INTRAVENOUS

## 2016-07-11 MED ORDER — INSULIN GLARGINE 100 UNIT/ML ~~LOC~~ SOLN
35.0000 [IU] | Freq: Every day | SUBCUTANEOUS | Status: DC
  Administered 2016-07-12: 35 [IU] via SUBCUTANEOUS
  Filled 2016-07-11 (×2): qty 0.35

## 2016-07-11 MED ORDER — ACETAMINOPHEN 325 MG PO TABS: 650.0000 mg | ORAL_TABLET | ORAL | Status: DC | PRN

## 2016-07-11 MED ORDER — SODIUM CHLORIDE 0.9 % IV SOLN: INTRAVENOUS | Status: DC

## 2016-07-11 MED ORDER — ASPIRIN 81 MG PO CHEW
324.0000 mg | CHEWABLE_TABLET | Freq: Once | ORAL | Status: AC
  Administered 2016-07-11: 324 mg via ORAL
  Filled 2016-07-11: qty 4

## 2016-07-11 MED ORDER — POTASSIUM CHLORIDE IN NACL 20-0.9 MEQ/L-% IV SOLN
Freq: Once | INTRAVENOUS | Status: AC
  Administered 2016-07-11: 21:00:00 via INTRAVENOUS
  Filled 2016-07-11: qty 1000

## 2016-07-11 MED ORDER — ONDANSETRON HCL 4 MG/2ML IJ SOLN
4.0000 mg | Freq: Once | INTRAMUSCULAR | Status: AC
  Administered 2016-07-11: 4 mg via INTRAVENOUS
  Filled 2016-07-11: qty 2

## 2016-07-11 MED ORDER — ONDANSETRON HCL 4 MG/2ML IJ SOLN
4.0000 mg | Freq: Four times a day (QID) | INTRAMUSCULAR | Status: DC | PRN
  Administered 2016-07-12: 4 mg via INTRAVENOUS
  Filled 2016-07-11: qty 2

## 2016-07-11 MED ORDER — SODIUM CHLORIDE 0.9 % IV SOLN
INTRAVENOUS | Status: DC
  Filled 2016-07-11: qty 2.5

## 2016-07-11 MED ORDER — SODIUM CHLORIDE 0.9 % IV SOLN
INTRAVENOUS | Status: DC
  Administered 2016-07-11: 2.5 [IU]/h via INTRAVENOUS
  Filled 2016-07-11: qty 2.5

## 2016-07-11 MED ORDER — POTASSIUM CHLORIDE 20 MEQ/15ML (10%) PO SOLN
20.0000 meq | Freq: Once | ORAL | Status: AC
  Administered 2016-07-11: 20 meq via ORAL
  Filled 2016-07-11: qty 15

## 2016-07-11 NOTE — H&P (Signed)
History and Physical    [name] [name] [id number] DOB: 01-Feb-1986 DOA: 07/11/2016  PCP: Minerva Ends, MD   Patient coming from: Home  Chief Complaint: Nausea and vomiting  HPI: [name] [name] is a 31 y.o. gentleman with a history of IDDM who presents to the ED for evaluation of nausea and vomiting that started this morning.  He is complaining of chest pain, shortness of breath, and hoarseness (though he denies sore throat).  No cough, cold, or congestion.  No known sick contacts.  He has had light-headedness but no LOC.  No diarrhea.  No dysuria.  He says that he has been compliant with his insulin.  ED Course: Chest xray negative for acute process.  WBC 15.  Bicarb 13.  BUN 21.  Creatinine 1.27.  Glucose 400.  Chest xray negative for acute process.  PH 7.3 on VBG.  U/A is not consistent with infection.    S/P 1L NS.  Insulin drip started as well as NS at 125cc/hr.  He received zofran for nausea.  Hospitalist asked to admit.  At the time my evaluation, he is complaining of substernal chest pressure.  He does not describe it as burning.  It is not radiating.  No shortness of breath, nausea, vomiting, or diaphoresis.  He has flat affect.  STAT EKG, ASA 359m po x one, GI cocktail, and troponin ordered.  Review of Systems: As per HPI otherwise 10 point review of systems negative.    Past Medical History:  Diagnosis Date  . [place] [address] . Renal insufficiency 2011   pt was on dialysis for a short time approx 2011, pt is no longer on dialysis    History reviewed. No pertinent surgical history.  He denies any past surgeries.   reports that he quit smoking about 2 years ago. He has never used smokeless tobacco. He reports that he does not drink alcohol or use drugs.  No Known Allergies  Family History  Problem Relation Age of Onset  . Diabetes Father   . Diabetes Maternal Grandmother   . Heart disease Neg Hx   . Hypertension Neg Hx        Prior to Admission medications   Medication Sig Start Date End Date Taking? Authorizing Provider  Blood Glucose Monitoring Suppl (TRUE METRIX METER) w/Device KIT 1 each by Does not apply route as needed. 06/28/15   Josalyn Funches, MD  glucose blood (TRUE METRIX BLOOD GLUCOSE TEST) test strip 1 each by Other route 3 (three) times daily. 06/28/15   Josalyn Funches, MD  insulin aspart (NOVOLOG) 100 UNIT/ML FlexPen 15 U three times daily with meals and 5 U up to twice daily with snacks Patient taking differently: Inject 15 Units into the skin 3 (three) times daily. 15 U three times daily with meals 02/07/16   JBoykin Nearing MD  Insulin Glargine (LANTUS) 100 UNIT/ML Solostar Pen Inject 45 Units into the skin daily at 10 pm. 06/01/16   Ankit CArsenio Loader MD  Insulin Syringe-Needle U-100 (B-D INS SYRINGE 0.5CC/31GX5/16) 31G X 5/16" 0.5 ML MISC 1 each by Does not apply route 2 (two) times daily. 03/27/16   Josalyn Funches, MD  TRUEPLUS LANCETS 28G MISC 1 each by Does not apply route 3 (three) times daily. 03/11/16   FFlorencia Reasons MD    Physical Exam: Vitals:   07/11/16 1715 07/11/16 1730 07/11/16 1745 07/11/16 1903  BP: 123/94 126/91 123/99 134/98  Pulse: (!) 123 119 116 117  Resp:  _0 Temp:      TempSrc:      SpO2: 97% 98% 94% 99%      Constitutional: NAD, calm, comfortable, NONtoxic appearing Vitals:   07/11/16 1715 07/11/16 1730 07/11/16 1745 07/11/16 1903  BP: 123/94 126/91 123/99 134/98  Pulse: (!) 123 119 116 117  Resp: _1 Temp:      TempSrc:      SpO2: 97% 98% 94% 99%   Eyes: PERRL, lids and conjunctivae normal ENMT: Mucous membranes are dry. Posterior pharynx clear of any exudate or lesions. Normal dentition.  No thrush. Neck: normal appearance, supple, no masses Respiratory: clear to auscultation bilaterally, no wheezing, no crackles. Normal respiratory effort. No accessory muscle use.  Cardiovascular: Tachycardic but regular.  No murmurs / rubs / gallops. No  extremity edema. 2+ pedal pulses.   GI: abdomen is soft and compressible.  No distention.  No tenderness.  No masses palpated.  Bowel sounds are present. Musculoskeletal:  No joint deformity in upper and lower extremities. Good ROM, no contractures. Normal muscle tone.  Skin: no rashes, warm and dry Neurologic: CN 2-12 grossly intact. Sensation intact, Strength symmetric bilaterally.  Generalized weakness. Psychiatric: Normal judgment and insight. Alert and oriented x 3. Flat affect.    Labs on Admission: I have personally reviewed following labs and imaging studies  CBC:  Recent Labs Lab 07/11/16 1441  WBC 15.1*  HGB 15.6  HCT 45.4  MCV 88.8  PLT 338   Basic Metabolic Panel:  Recent Labs Lab 07/11/16 1441 07/11/16 1728  NA 137 140  K 3.9 4.1  CL 102 105  CO2 13* 16*  GLUCOSE 400* 308*  BUN 21* 22*  CREATININE 1.27* 1.21  CALCIUM 9.4 9.6   GFR: CrCl cannot be calculated (Unknown ideal weight.). CBG:  Recent Labs Lab 07/11/16 1440 07/11/16 1721 07/11/16 1903  GLUCAP 465* 312* 180*   Urine analysis: Urinalysis    Component Value Date/Time   COLORURINE YELLOW 07/11/2016 2257   APPEARANCEUR HAZY (A) 07/11/2016 2257   LABSPEC 1.023 07/11/2016 2257   PHURINE 5.0 07/11/2016 2257   GLUCOSEU >=500 (A) 07/11/2016 2257   HGBUR NEGATIVE 07/11/2016 2257   BILIRUBINUR NEGATIVE 07/11/2016 2257   BILIRUBINUR Negative 02/07/2016 1528   KETONESUR 80 (A) 07/11/2016 2257   PROTEINUR 100 (A) 07/11/2016 2257   UROBILINOGEN 0.2 02/07/2016 1528   UROBILINOGEN 0.2 03/22/2015 0913   NITRITE NEGATIVE 07/11/2016 2257   LEUKOCYTESUR NEGATIVE 07/11/2016 2257    Radiological Exams on Admission: Dg Chest 2 View  Result Date: 07/11/2016 CLINICAL DATA:  Mid chest pain and vomiting EXAM: CHEST  2 VIEW COMPARISON:  05/29/2016 FINDINGS: The heart size and mediastinal contours are within normal limits. Both lungs are clear. The visualized skeletal structures are unremarkable.  IMPRESSION: No active cardiopulmonary disease. Electronically Signed   By: Donavan Foil M.D.   On: 07/11/2016 18:28    EKG: Independently reviewed. NSR.  No acute ST segment changes.  Assessment/Plan Principal Problem:   DKA (diabetic ketoacidoses) (HCC) Active Problems:   Chest pain   AKI (acute kidney injury) ([place])      DKA, cause unclear --Treat per protocol  Chest pain --Telemetry monitoring --Serial troponin --Empiric pepcid --Analgesics as needed --Echo deferred for now  AKI --Expect improvement with hydration  DVT prophylaxis: Low risk, outpatient status Code Status: FULL Family Communication: Patient alone in the ED at time of admission. Disposition Plan: Expect he will go home tomorrow. Consults called:  NONE Admission status: Place in observation; stepdown unit for insulin drip   TIME SPENT: 60 minutes   Eber Jones MD [facility] Pager [phone]  If 7PM-7AM, please contact night-coverage [email or web address] Password [facility]  07/11/2016, 7:31 PM

## 2016-07-11 NOTE — ED Triage Notes (Signed)
Patient complains of acute onset of nausea and vomiting with extreme weakness since am, HR 120s. Patient thinks he may be in DKA. Skin dry, alert and oiented

## 2016-07-11 NOTE — ED Provider Notes (Signed)
[facility] DEPT Provider Note   CSN: [id number] Arrival date & time: 07/11/16  1425     History   Chief Complaint Chief Complaint  Patient presents with  . Abdominal Pain  . Emesis    HPI [name] [name] is a 31 y.o. male.   The history is provided by the patient.  Emesis   This is a new problem. The current episode started 6 to 12 hours ago. The problem occurs 5 to 10 times per day. The problem has not changed since onset.The emesis has an appearance of stomach contents. There has been no fever. Associated symptoms include abdominal pain and cough. Pertinent negatives include no arthralgias, no chills and no fever.    Past Medical History:  Diagnosis Date  . Diabetes mellitus without complication ([place]) Dx 6256  . Renal insufficiency 2011   pt was on dialysis for a short time approx 2011, pt is no longer on dialysis    Patient Active Problem List   Diagnosis Date Noted  . AKI (acute kidney injury) ([place]) 07/11/2016  . Hyperkalemia 03/09/2016  . Renal insufficiency 03/09/2016  . Leukocytosis 03/09/2016  . Diabetic ketoacidosis without coma associated with type 1 diabetes mellitus ([place])   . Non-intractable vomiting with nausea   . Chest pain 12/18/2015  . DKA, type 1, not at goal [facility]) 12/02/2015  . DKA (diabetic ketoacidoses) ([place]) 12/01/2015  . Marijuana use 07/22/2015  . Proteinuria with type 1 diabetes mellitus ([place]) 07/22/2015  . High blood pressure 07/22/2015  . Uncontrolled type 1 diabetes mellitus with ketoacidosis without coma ([place])   . Hypocalcemia   . Acute renal failure ([place])   . Hypokalemia   . Hypomagnesemia   . Abnormal EKG 06/15/2015  . Sinus tachycardia   . Tachypnea   . Tinea pedis 12/17/2014  . Adjustment disorder with mixed anxiety and depressed mood 09/03/2014  . Protein-calorie malnutrition, severe ([place]) 07/06/2014  . DM (diabetes mellitus), type 1, uncontrolled ([place]) 05/01/2014  . HLD (hyperlipidemia) 04/26/2014  . Substance abuse   .  Dehydration 07/07/2013  . Fatigue 03/10/2012    History reviewed. No pertinent surgical history.     Home Medications    Prior to Admission medications   Medication Sig Start Date End Date Taking? Authorizing Provider  Blood Glucose Monitoring Suppl (TRUE METRIX METER) w/Device KIT 1 each by Does not apply route as needed. 07/12/16   Florencia Reasons, MD  glucose blood (TRUE METRIX BLOOD GLUCOSE TEST) test strip 1 each by Other route 3 (three) times daily. 07/12/16   Florencia Reasons, MD  insulin aspart (NOVOLOG) 100 UNIT/ML FlexPen Inject 15 Units into the skin 3 (three) times daily. 15 U three times daily with meals 07/12/16   Florencia Reasons, MD  Insulin Glargine (LANTUS) 100 UNIT/ML Solostar Pen Inject 40 Units into the skin daily at 10 pm. 07/12/16   Florencia Reasons, MD  Insulin Syringe-Needle U-100 (B-D INS SYRINGE 0.5CC/31GX5/16) 31G X 5/16" 0.5 ML MISC 1 each by Does not apply route 2 (two) times daily. Patient not taking: Reported on 07/12/2016 03/27/16   [name], MD  TRUEPLUS LANCETS 28G MISC 1 each by Does not apply route 3 (three) times daily. Patient not taking: Reported on 07/12/2016 03/11/16   Florencia Reasons, MD    Family History Family History  Problem Relation Age of Onset  . Diabetes Father   . Diabetes Maternal Grandmother   . Heart disease Neg Hx   . Hypertension Neg Hx     Social History  Social History  Substance Use Topics  . Smoking status: Former Smoker    Quit date: 09/19/2013  . Smokeless tobacco: Never Used  . Alcohol use No     Allergies   Patient has no known allergies.   Review of Systems Review of Systems  Constitutional: Negative for chills and fever.  HENT: Negative for ear pain and sore throat.   Eyes: Negative for pain and visual disturbance.  Respiratory: Positive for cough. Negative for shortness of breath.   Cardiovascular: Negative for chest pain and palpitations.  Gastrointestinal: Positive for abdominal pain and vomiting.  Genitourinary: Negative for dysuria and  hematuria.  Musculoskeletal: Negative for arthralgias and back pain.  Skin: Negative for color change and rash.  Neurological: Negative for seizures and syncope.  All other systems reviewed and are negative.    Physical Exam Updated Vital Signs BP 99/73 (BP Location: Left Arm)   Pulse 87   Temp 98.5 F (36.9 C) (Oral)   Resp 12   Ht _0  (1.676 m)   Wt 46.2 kg   SpO2 100%   BMI 16.44 kg/m   Physical Exam  Constitutional: He is oriented to person, place, and time. He appears well-developed and well-nourished.  HENT:  Head: Normocephalic and atraumatic.  Eyes: Conjunctivae are normal.  Neck: Neck supple.  Cardiovascular: Regular rhythm.   No murmur heard. tachycardic  Pulmonary/Chest: Effort normal and breath sounds normal. No respiratory distress.  Abdominal: Soft. He exhibits no distension and no mass. There is tenderness. There is no rebound and no guarding.  Mild diffuse tenderness to palpation  Musculoskeletal: He exhibits no edema, tenderness or deformity.  Neurological: He is alert and oriented to person, place, and time.  Skin: Skin is warm and dry.  Psychiatric: He has a normal mood and affect.  Nursing note and vitals reviewed.    ED Treatments / Results  Labs (all labs ordered are listed, but only abnormal results are displayed) Labs Reviewed  BASIC METABOLIC PANEL - Abnormal; Notable for the following:       Result Value   CO2 13 (*)    Glucose, Bld 400 (*)    BUN 21 (*)    Creatinine, Ser 1.27 (*)    Anion gap 22 (*)    All other components within normal limits  CBC - Abnormal; Notable for the following:    WBC 15.1 (*)    All other components within normal limits  URINALYSIS, ROUTINE W REFLEX MICROSCOPIC - Abnormal; Notable for the following:    APPearance HAZY (*)    Glucose, UA >=500 (*)    Ketones, ur 80 (*)    Protein, ur 100 (*)    Squamous Epithelial / LPF 0-5 (*)    All other components within normal limits  BETA-HYDROXYBUTYRIC ACID -  Abnormal; Notable for the following:    Beta-Hydroxybutyric Acid 5.45 (*)    All other components within normal limits  BASIC METABOLIC PANEL - Abnormal; Notable for the following:    CO2 16 (*)    Glucose, Bld 308 (*)    BUN 22 (*)    Anion gap 19 (*)    All other components within normal limits  HEPATIC FUNCTION PANEL - Abnormal; Notable for the following:    Indirect Bilirubin 1.0 (*)    All other components within normal limits  BASIC METABOLIC PANEL - Abnormal; Notable for the following:    Chloride 113 (*)    CO2 21 (*)    Glucose,  Bld 156 (*)    Calcium 8.4 (*)    All other components within normal limits  GLUCOSE, CAPILLARY - Abnormal; Notable for the following:    Glucose-Capillary 128 (*)    All other components within normal limits  GLUCOSE, CAPILLARY - Abnormal; Notable for the following:    Glucose-Capillary 110 (*)    All other components within normal limits  GLUCOSE, CAPILLARY - Abnormal; Notable for the following:    Glucose-Capillary 121 (*)    All other components within normal limits  GLUCOSE, CAPILLARY - Abnormal; Notable for the following:    Glucose-Capillary 124 (*)    All other components within normal limits  GLUCOSE, CAPILLARY - Abnormal; Notable for the following:    Glucose-Capillary 102 (*)    All other components within normal limits  CBC - Abnormal; Notable for the following:    Hemoglobin 12.7 (*)    HCT 37.3 (*)    All other components within normal limits  BASIC METABOLIC PANEL - Abnormal; Notable for the following:    Potassium 3.4 (*)    Glucose, Bld 117 (*)    Calcium 8.0 (*)    All other components within normal limits  GLUCOSE, CAPILLARY - Abnormal; Notable for the following:    Glucose-Capillary 165 (*)    All other components within normal limits  CBG MONITORING, ED - Abnormal; Notable for the following:    Glucose-Capillary 465 (*)    All other components within normal limits  CBG MONITORING, ED - Abnormal; Notable for the  following:    Glucose-Capillary 312 (*)    All other components within normal limits  I-STAT VENOUS BLOOD GAS, ED - Abnormal; Notable for the following:    pCO2, Ven 32.4 (*)    Bicarbonate 16.0 (*)    Acid-base deficit 9.0 (*)    All other components within normal limits  CBG MONITORING, ED - Abnormal; Notable for the following:    Glucose-Capillary 180 (*)    All other components within normal limits  CBG MONITORING, ED - Abnormal; Notable for the following:    Glucose-Capillary 161 (*)    All other components within normal limits  MRSA PCR SCREENING  TROPONIN I  TROPONIN I  TROPONIN I  LIPASE, BLOOD  MAGNESIUM  CBG MONITORING, ED  CBG MONITORING, ED    EKG  EKG Interpretation None       Radiology Dg Chest 2 View  Result Date: 07/11/2016 CLINICAL DATA:  Mid chest pain and vomiting EXAM: CHEST  2 VIEW COMPARISON:  05/29/2016 FINDINGS: The heart size and mediastinal contours are within normal limits. Both lungs are clear. The visualized skeletal structures are unremarkable. IMPRESSION: No active cardiopulmonary disease. Electronically Signed   By: Donavan Foil M.D.   On: 07/11/2016 18:28    Procedures Procedures (including critical care time)  Medications Ordered in ED Medications  famotidine (PEPCID) tablet 20 mg (20 mg Oral Given 07/11/16 2318)  acetaminophen (TYLENOL) tablet 650 mg (not administered)  ondansetron (ZOFRAN) injection 4 mg (not administered)  morphine 2 MG/ML injection 2 mg (not administered)  insulin glargine (LANTUS) injection 35 Units (35 Units Subcutaneous Given 07/12/16 0012)  insulin aspart (novoLOG) injection 0-9 Units (0 Units Subcutaneous Not Given 07/12/16 0802)  potassium chloride SA (K-DUR,KLOR-CON) CR tablet 40 mEq (not administered)  sodium chloride 0.9 % bolus 1,000 mL (0 mLs Intravenous Stopped 07/11/16 2025)  ondansetron (ZOFRAN) injection 4 mg (4 mg Intravenous Given 07/11/16 1725)  0.9 % NaCl with KCl 20 mEq/  L  infusion ( Intravenous  Transfusing/Transfer 07/11/16 2100)  aspirin chewable tablet 324 mg (324 mg Oral Given 07/11/16 2027)  gi cocktail (Maalox,Lidocaine,Donnatal) (30 mLs Oral Given 07/11/16 2029)  sodium chloride 0.9 % bolus 1,000 mL (1,000 mLs Intravenous Transfusing/Transfer 07/11/16 2100)  potassium chloride 20 MEQ/15ML (10%) solution 20 mEq (20 mEq Oral Given 07/11/16 2318)     Initial Impression / Assessment and Plan / ED Course  I have reviewed the triage vital signs and the nursing notes.  Pertinent labs & imaging results that were available during my care of the patient were reviewed by me and considered in my medical decision making (see chart for details).    Pt with hx as above p/w 1 day of emesis and abd pain. Abd with mild diffuse tenderness. Labs suggest mild DKA. Co2 13, AG 22, pH 7.3. Fluids and insulin drip initiated. Tachycardia improved with fluids. Pt admitted to hospitalist for further treatment of DKA. Based on abd exam, do not suspect significant intraabdominal pathology at this time. Plan to reassess pain as DKA resolves.  Final Clinical Impressions(s) / ED Diagnoses   Final diagnoses:  Uncontrolled type 1 diabetes mellitus with ketoacidosis without coma [facility])    New Prescriptions Current Discharge Medication List       Clifton James, MD 07/12/16 [place], MD 07/13/16 1243

## 2016-07-11 NOTE — ED Notes (Signed)
IV team at bedside 

## 2016-07-11 NOTE — ED Notes (Signed)
This RN attempted for IV x2 with no success

## 2016-07-11 NOTE — ED Notes (Signed)
Pt CBG was 180, notified Casey(RN)

## 2016-07-11 NOTE — ED Notes (Signed)
Attempted report x1. 

## 2016-07-11 NOTE — ED Notes (Signed)
Insulin rate change verified with Inetta Fermoina, RN

## 2016-07-11 NOTE — ED Notes (Signed)
Admitting Provider at bedside. 

## 2016-07-12 DIAGNOSIS — E119 Type 2 diabetes mellitus without complications: Secondary | ICD-10-CM

## 2016-07-12 DIAGNOSIS — E0810 Diabetes mellitus due to underlying condition with ketoacidosis without coma: Secondary | ICD-10-CM

## 2016-07-12 DIAGNOSIS — Z794 Long term (current) use of insulin: Secondary | ICD-10-CM

## 2016-07-12 DIAGNOSIS — N179 Acute kidney failure, unspecified: Secondary | ICD-10-CM

## 2016-07-12 LAB — CBC
HEMATOCRIT: 37.3 % — AB (ref 39.0–52.0)
HEMOGLOBIN: 12.7 g/dL — AB (ref 13.0–17.0)
MCH: 30 pg (ref 26.0–34.0)
MCHC: 34 g/dL (ref 30.0–36.0)
MCV: 88.2 fL (ref 78.0–100.0)
Platelets: 194 10*3/uL (ref 150–400)
RBC: 4.23 MIL/uL (ref 4.22–5.81)
RDW: 13.6 % (ref 11.5–15.5)
WBC: 7.4 10*3/uL (ref 4.0–10.5)

## 2016-07-12 LAB — BASIC METABOLIC PANEL
Anion gap: 11 (ref 5–15)
BUN: 14 mg/dL (ref 6–20)
CO2: 23 mmol/L (ref 22–32)
CREATININE: 0.64 mg/dL (ref 0.61–1.24)
Calcium: 8 mg/dL — ABNORMAL LOW (ref 8.9–10.3)
Chloride: 106 mmol/L (ref 101–111)
GFR calc non Af Amer: >60 mL/min (ref >60)
Glucose, Bld: 117 mg/dL — ABNORMAL HIGH (ref 65–99)
Potassium: 3.4 mmol/L — ABNORMAL LOW (ref 3.5–5.1)
Sodium: 140 mmol/L (ref 135–145)

## 2016-07-12 LAB — GLUCOSE, CAPILLARY
GLUCOSE-CAPILLARY: 121 mg/dL — AB (ref 65–99)
GLUCOSE-CAPILLARY: 165 mg/dL — AB (ref 65–99)
Glucose-Capillary: 124 mg/dL — ABNORMAL HIGH (ref 65–99)
Glucose-Capillary: 102 mg/dL — ABNORMAL HIGH (ref 65–99)
Glucose-Capillary: 114 mg/dL — ABNORMAL HIGH (ref 65–99)

## 2016-07-12 LAB — MRSA PCR SCREENING: MRSA BY PCR: NEGATIVE

## 2016-07-12 LAB — MAGNESIUM: Magnesium: 1.9 mg/dL (ref 1.7–2.4)

## 2016-07-12 LAB — TROPONIN I
Troponin I: <0.03 ng/mL (ref <0.03)
Troponin I: <0.03 ng/mL (ref <0.03)

## 2016-07-12 MED ORDER — INSULIN ASPART 100 UNIT/ML ~~LOC~~ SOLN
0.0000 [IU] | Freq: Three times a day (TID) | SUBCUTANEOUS | Status: DC
Start: 2016-07-12 — End: 2016-07-12

## 2016-07-12 MED ORDER — INSULIN ASPART 100 UNIT/ML FLEXPEN: 15.0000 [IU] | PEN_INJECTOR | Freq: Three times a day (TID) | SUBCUTANEOUS | 0 refills | Status: DC

## 2016-07-12 MED ORDER — INSULIN GLARGINE 100 UNIT/ML SOLOSTAR PEN: 40.0000 [IU] | PEN_INJECTOR | Freq: Every day | SUBCUTANEOUS | 0 refills | Status: DC

## 2016-07-12 MED ORDER — TRUE METRIX METER W/DEVICE KIT: 1.0000 | PACK | 0 refills | Status: DC | PRN

## 2016-07-12 MED ORDER — GLUCOSE BLOOD VI STRP: 1.0000 | ORAL_STRIP | Freq: Three times a day (TID) | 0 refills | Status: DC

## 2016-07-12 MED ORDER — POTASSIUM CHLORIDE IN NACL 20-0.9 MEQ/L-% IV SOLN: Freq: Once | INTRAVENOUS | Status: DC

## 2016-07-12 MED ORDER — POTASSIUM CHLORIDE CRYS ER 20 MEQ PO TBCR
40.0000 meq | EXTENDED_RELEASE_TABLET | Freq: Once | ORAL | Status: AC
  Administered 2016-07-12: 40 meq via ORAL
  Filled 2016-07-12: qty 2

## 2016-07-12 MED ORDER — SODIUM CHLORIDE 0.9 % IV BOLUS (SEPSIS): 1000.0000 mL | Freq: Once | INTRAVENOUS | Status: DC

## 2016-07-12 MED FILL — !NOVOLOG 100UNITS/ML VIAL: 100/ML | 22 days supply | Qty: 10 | Fill #0

## 2016-07-12 MED FILL — !LANTUS SOLOSTAR 100UNITS/M: 100 | 14 days supply | Qty: 6 | Fill #0

## 2016-07-12 MED FILL — TRUE METRIX TEST STRIP: 33 days supply | Qty: 100 | Fill #0

## 2016-07-12 MED FILL — TRUE METRIX BLOOD GLUCOSE M: W/DEVICE | 365 days supply | Qty: 1 | Fill #0

## 2016-07-12 NOTE — Discharge Summary (Addendum)
Discharge Summary  [name] [name] ZDG:[id number] DOB: [birth year]  PCP: Minerva Ends, MD  Admit date: 07/11/2016 Discharge date: 07/12/2016  Time spent: <24mns  Recommendations for Outpatient Follow-up:  1. F/u with PMD within a week  for hospital discharge follow up, repeat cbc/bmp at follow up  Discharge Diagnoses:  Active Hospital Problems   Diagnosis Date Noted  . DKA (diabetic ketoacidoses) ([place] 12/01/2015  . AKI (acute kidney injury) ([place] 07/11/2016  . Chest pain 12/18/2015    Resolved Hospital Problems   Diagnosis Date Noted Date Resolved  No resolved problems to display.    Discharge Condition: stable  Diet recommendation: heart healthy/carb modified  Filed Weights   07/11/16 2122  Weight: 46.2 kg (101 lb 13.6 oz)    History of present illness:  Patient coming from: Home  Chief Complaint: Nausea and vomiting  HPI: AAdolphe Fortunatois a 31y.o. gentleman with a history of IDDM who presents to the ED for evaluation of nausea and vomiting that started this morning.  He is complaining of chest pain, shortness of breath, and hoarseness (though he denies sore throat).  No cough, cold, or congestion.  No known sick contacts.  He has had light-headedness but no LOC.  No diarrhea.  No dysuria.  He says that he has been compliant with his insulin.  ED Course: Chest xray negative for acute process.  WBC 15.  Bicarb 13.  BUN 21.  Creatinine 1.27.  Glucose 400.  Chest xray negative for acute process.  PH 7.3 on VBG.  U/A is not consistent with infection.    S/P 1L NS.  Insulin drip started as well as NS at 125cc/hr.  He received zofran for nausea.  Hospitalist asked to admit.  At the time my evaluation, he is complaining of substernal chest pressure.  He does not describe it as burning.  It is not radiating.  No shortness of breath, nausea, vomiting, or diaphoresis.  He has flat affect.  STAT EKG, ASA '324mg'$  po x one, GI cocktail, and troponin ordered.  Hospital  Course:  Principal Problem:   DKA (diabetic ketoacidoses) ([facility]) Active Problems:   Chest pain   AKI (acute kidney injury) ([place]   Recurrent DKA: likely secondary to meds and diet noncompliance. He was started on inuslin drip and admitted to stepdown,  Gap closed, transitioned to subQ insulin, restarted diet,  DKA resolved.  N/V: from DKA ? Patient also has history of marijuana use. Resolve, tolerating diet.  Chest pain vs epigastric pain, resolved. Troponin negative  AKI: resolved with fluids. ua concentrated, no infection.  Hypokalemia: replaced k, mag 1.9  Leukocytosis/sinu tachycardia: likely from dehydration and stress, resolved.  Type 1 diabetes: a1c range from 13.9 to 16.2 in the last year. Diabetic RN input appreciated,    Procedures:  none  Consultations:  Diabetes RN  Discharge Exam: BP 99/73 (BP Location: Left Arm)   Pulse 87   Temp 99.1 F (37.3 C) (Oral)   Resp 12   Ht '5\' 6"'$  (1.676 m)   Wt 46.2 kg (101 lb 13.6 oz)   SpO2 100%   BMI 16.44 kg/m   General: NAD Cardiovascular: RRR Respiratory: CTABL  Discharge Instructions You were cared for by a hospitalist during your hospital stay. If you have any questions about your discharge medications or the care you received while you were in the hospital after you are discharged, you can call the unit and asked to speak with the hospitalist on call if the hospitalist that  took care of you is not available. Once you are discharged, your primary care physician will handle any further medical issues. Please note that NO REFILLS for any discharge medications will be authorized once you are discharged, as it is imperative that you return to your primary care physician (or establish a relationship with a primary care physician if you do not have one) for your aftercare needs so that they can reassess your need for medications and monitor your lab values.  Discharge Instructions    Diet - low sodium heart healthy     Complete by:  As directed    Carb modified   Increase activity slowly    Complete by:  As directed      Allergies as of 07/12/2016   No Known Allergies     Medication List    TAKE these medications   glucose blood test strip Commonly known as:  TRUE METRIX BLOOD GLUCOSE TEST 1 each by Other route 3 (three) times daily.   insulin aspart 100 UNIT/ML FlexPen Commonly known as:  NOVOLOG Inject 15 Units into the skin 3 (three) times daily. 15 U three times daily with meals What changed:  how much to take  how to take this  when to take this  additional instructions   Insulin Glargine 100 UNIT/ML Solostar Pen Commonly known as:  LANTUS Inject 40 Units into the skin daily at 10 pm.   Insulin Syringe-Needle U-100 31G X 5/16" 0.5 ML Misc Commonly known as:  B-D INS SYRINGE 0.5CC/31GX5/16 1 each by Does not apply route 2 (two) times daily.   TRUE METRIX METER w/Device Kit 1 each by Does not apply route as needed.   TRUEPLUS LANCETS 28G Misc 1 each by Does not apply route 3 (three) times daily.      No Known Allergies Follow-up Information    Minerva Ends, MD In 1 week.   Specialty:  Family Medicine Why:  Please call to schedule a hospital discharge follow up. Please continue to work with your doctor for blood sugar control. Contact information: [place]  [phone]            The results of significant diagnostics from this hospitalization (including imaging, microbiology, ancillary and laboratory) are listed below for reference.    Significant Diagnostic Studies: Dg Chest 2 View  Result Date: 07/11/2016 CLINICAL DATA:  Mid chest pain and vomiting EXAM: CHEST  2 VIEW COMPARISON:  05/29/2016 FINDINGS: The heart size and mediastinal contours are within normal limits. Both lungs are clear. The visualized skeletal structures are unremarkable. IMPRESSION: No active cardiopulmonary disease. Electronically Signed   By: Donavan Foil M.D.   On: 07/11/2016 18:28    Microbiology: Recent Results (from the past 240 hour(s))  MRSA PCR Screening     Status: None   Collection Time: 07/11/16 10:44 PM  Result Value Ref Range Status   MRSA by PCR NEGATIVE NEGATIVE Final    Comment:        The GeneXpert MRSA Assay (FDA approved for NASAL specimens only), is one component of a comprehensive MRSA colonization surveillance program. It is not intended to diagnose MRSA infection nor to guide or monitor treatment for MRSA infections.      Labs: Basic Metabolic Panel:  Recent Labs Lab 07/11/16 1441 07/11/16 1728 07/11/16 2138 07/12/16 0831  NA 137 140 144 140  K 3.9 4.1 3.8 3.4*  CL 102 105 113* 106  CO2 13* 16* 21* 23  GLUCOSE 400* 308* 156* 117*  BUN 21* 22* 20 14  CREATININE 1.27* 1.21 0.88 0.64  CALCIUM 9.4 9.6 8.4* 8.0*  MG  --   --   --  1.9   Liver Function Tests:  Recent Labs Lab 07/11/16 2144  AST 24  ALT 32  ALKPHOS 63  BILITOT 1.1  PROT 6.5  ALBUMIN 3.6    Recent Labs Lab 07/11/16 2144  LIPASE 44   No results for input(s): AMMONIA in the last 168 hours. CBC:  Recent Labs Lab 07/11/16 1441 07/12/16 0831  WBC 15.1* 7.4  HGB 15.6 12.7*  HCT 45.4 37.3*  MCV 88.8 88.2  PLT 309 194   Cardiac Enzymes:  Recent Labs Lab 07/11/16 2144 07/12/16 0217 07/12/16 0753  TROPONINI <0.03 <0.03 <0.03   BNP: BNP (last 3 results) No results for input(s): BNP in the last 8760 hours.  ProBNP (last 3 results) No results for input(s): PROBNP in the last 8760 hours.  CBG:  Recent Labs Lab 07/11/16 2341 07/12/16 0049 07/12/16 0156 07/12/16 0724 07/12/16 1231  GLUCAP 110* 121* 124* 102* 114*       Signed:  Tanza Pellot MD, PhD  [facility] 07/12/2016, 6:14 PM

## 2016-07-12 NOTE — Progress Notes (Signed)
Inpatient Diabetes Program Recommendations  AACE/ADA: New Consensus Statement on Inpatient Glycemic Control (2015)  Target Ranges:  Prepandial:   less than 140 mg/dL      Peak postprandial:   less than 180 mg/dL (1-2 hours)      Critically ill patients:  140 - 180 mg/dL   Lab Results  Component Value Date   GLUCAP 102 (H) 07/12/2016   HGBA1C 14.1 (H) 05/30/2016    Review of Glycemic Control:  Diabetes history:     DM1, underweight Outpatient Diabetes medications:     Novolog 15 units tidac,     Lantus 45 units qhs  Current orders for Inpatient glycemic control:     Novolog 0-9 units tidac,     Lantus 35 units qhs  Inpatient Diabetes Program Recommendations:  Insulin - Basal: agree with current orders  Correction (SSI): agree with sensitive scale for type 1 DM  Insulin - Meal Coverage: Novolog 3 units tidac if patient eats > 50% of meal  [facility]: [facility]  Thank you,  Kristine LineaKaren Levander Katzenstein, RN, MSN Diabetes Coordinator Inpatient Diabetes Program [phone] (Team Pager)

## 2016-07-21 ENCOUNTER — Other Ambulatory Visit: Payer: Self-pay | Admitting: Family Medicine

## 2016-07-21 ENCOUNTER — Ambulatory Visit: Payer: Self-pay | Attending: Family Medicine | Admitting: Physician Assistant

## 2016-07-21 VITALS — BP 111/76 | HR 99 | Temp 98.0°F | Resp 16 | Wt 109.6 lb

## 2016-07-21 DIAGNOSIS — Z23 Encounter for immunization: Secondary | ICD-10-CM

## 2016-07-21 DIAGNOSIS — Z79899 Other long term (current) drug therapy: Secondary | ICD-10-CM | POA: Insufficient documentation

## 2016-07-21 DIAGNOSIS — Z794 Long term (current) use of insulin: Secondary | ICD-10-CM | POA: Insufficient documentation

## 2016-07-21 DIAGNOSIS — E876 Hypokalemia: Secondary | ICD-10-CM

## 2016-07-21 DIAGNOSIS — E1010 Type 1 diabetes mellitus with ketoacidosis without coma: Secondary | ICD-10-CM

## 2016-07-21 LAB — BASIC METABOLIC PANEL
BUN: 10 mg/dL (ref 7–25)
CALCIUM: 9.2 mg/dL (ref 8.6–10.3)
CHLORIDE: 97 mmol/L — AB (ref 98–110)
CO2: 28 mmol/L (ref 20–31)
CREATININE: 0.72 mg/dL (ref 0.60–1.35)
Glucose, Bld: 209 mg/dL — ABNORMAL HIGH (ref 65–99)
Potassium: 4.1 mmol/L (ref 3.5–5.3)
Sodium: 136 mmol/L (ref 135–146)

## 2016-07-21 LAB — GLUCOSE, POCT (MANUAL RESULT ENTRY): POC GLUCOSE: 170 mg/dL — AB (ref 70–99)

## 2016-07-21 MED ORDER — INSULIN LISPRO 100 UNIT/ML (KWIKPEN): 15.0000 [IU] | PEN_INJECTOR | Freq: Three times a day (TID) | SUBCUTANEOUS | 3 refills | Status: DC

## 2016-07-21 NOTE — Progress Notes (Signed)
[name] [name], is a 31 y.o. male  XKG:[id number]  FWY:[id number]  DOB - January 18, 1986  Subjective:  Chief Complaint and HPI: [name] [name] is a 31 y.o. male here today for hospital follow-up after being inpatient 2/20-2/21/2018 for nausea, vomiting, uncontrolled diabetes (Type 1), CP, and SOB.  He has had several hospitalizations for DKA within the last year.  He is feeling much better since hospitalization although his appetite is still not back to normal.  He is compliant with his Lantus and usu takes Novolog 2 of the 3 prescribed doses daily.  He does not have any new complaints today.  He has had very poor control of his diabetes and is trying to make more changes and adhere to his medication more closely.  Checking blood sugars 4 times daily and getting numbers from about 150-220.  ED/Hospital notes reviewed.  K+=3.4 on discharge Social History:  Works at [employer]:   Constitutional:  No f/c, No night sweats, No unexplained weight loss. EENT:  No vision changes, No blurry vision, No hearing changes. No mouth, throat, or ear problems.  Respiratory: No cough, No SOB Cardiac: No CP, no palpitations GI:  No abd pain, No N/V/D. GU: No Urinary s/sx Musculoskeletal: No joint pain Neuro: No headache, no dizziness, no motor weakness.  Skin: No rash Endocrine:  No polydipsia. No polyuria.  Psych: Denies SI/HI  No problems updated.  ALLERGIES: No Known Allergies  PAST MEDICAL HISTORY: Past Medical History:  Diagnosis Date  . Diabetes mellitus without complication ([place]) Dx 2774  . Renal insufficiency 2011   pt was on dialysis for a short time approx 2011, pt is no longer on dialysis    MEDICATIONS AT HOME: Prior to Admission medications   Medication Sig Start Date End Date Taking? Authorizing Provider  Blood Glucose Monitoring Suppl (TRUE METRIX METER) w/Device KIT 1 each by Does not apply route as needed. 07/12/16   Florencia Reasons, MD  glucose blood (TRUE METRIX BLOOD  GLUCOSE TEST) test strip 1 each by Other route 3 (three) times daily. 07/12/16   Florencia Reasons, MD  insulin aspart (NOVOLOG) 100 UNIT/ML FlexPen Inject 15 Units into the skin 3 (three) times daily. 15 U three times daily with meals 07/12/16   Florencia Reasons, MD  Insulin Glargine (LANTUS) 100 UNIT/ML Solostar Pen Inject 40 Units into the skin daily at 10 pm. 07/12/16   Florencia Reasons, MD  Insulin Syringe-Needle U-100 (B-D INS SYRINGE 0.5CC/31GX5/16) 31G X 5/16" 0.5 ML MISC 1 each by Does not apply route 2 (two) times daily. Patient not taking: Reported on 07/12/2016 03/27/16   [name], MD  TRUEPLUS LANCETS 28G MISC 1 each by Does not apply route 3 (three) times daily. Patient not taking: Reported on 07/12/2016 03/11/16   Florencia Reasons, MD     Objective:  EXAM:   Vitals:   07/21/16 1015  BP: 111/76  Pulse: 99  Resp: 16  Temp: 98 F (36.7 C)  TempSrc: Oral  SpO2: 96%  Weight: 109 lb 9.6 oz (49.7 kg)    General appearance : A&OX3. NAD. Non-toxic-appearing, thin HEENT: Atraumatic and Normocephalic.  PERRLA. EOM intact.  Neck: supple, no JVD. No cervical lymphadenopathy. No thyromegaly Chest/Lungs:  Breathing-non-labored, Good air entry bilaterally, breath sounds normal without rales, rhonchi, or wheezing  CVS: S1 S2 regular, no murmurs, gallops, rubs Extremities: Bilateral Lower Ext shows no edema, both legs are warm to touch with = pulse throughout Neurology:  CN II-XII grossly intact, Non focal.  Psych:  TP linear. J/I WNL. Normal speech. Appropriate eye contact and affect.  Skin:  No Rash  Data Review Lab Results  Component Value Date   HGBA1C 14.1 (H) 05/30/2016   HGBA1C 13.9 (H) 03/10/2016   HGBA1C 13.9 (H) 12/02/2015     Assessment & Plan  1. Uncontrolled type 1 diabetes mellitus with ketoacidosis without coma ([place]) - Basic metabolic panel Urine for microalbumin Continue current regimen of 40 units Lantus at night and 15 units Novolog tid with meals Check blood sugars 4 times daily and  record along with your insulin doses.  Bring to your next visit and meet with Theda Sers for evaluation and diabetes medication input.  2. Hypokalemia s/p recent DKA and hospitalization. - Basic metabolic panel  Glucerna was given but should be used in moderation and no more than 2-3 per week.  Patient have been counseled extensively about nutrition and exercise  Return in about 3 weeks (around 08/11/2016) for Appt with Theda Sers for DM meds adjustment then to see Dr Adrian Blackwater mid April.  The patient was given clear instructions to go to ER or return to medical center if symptoms don't improve, worsen or new problems develop. The patient verbalized understanding. The patient was told to call to get lab results if they haven't heard anything in the next week.     Freeman Caldron, PA-C [facility] and [place], [place]   07/21/2016, 10:33 AMPatient ID: [name] [name], male   DOB: [birth year], 31 y.o.   MRN: [id number]

## 2016-07-21 NOTE — Patient Instructions (Addendum)
Check blood sugars 4 times daily and record along with your insulin doses.  Bring to your next visit.   Pneumococcal Polysaccharide Vaccine: What You Need to Know 1. Why get vaccinated? Vaccination can protect older adults (and some children and younger adults) from pneumococcal disease. Pneumococcal disease is caused by bacteria that can spread from person to person through close contact. It can cause ear infections, and it can also lead to more serious infections of the:  Lungs (pneumonia),  Blood (bacteremia), and  Covering of the brain and spinal cord (meningitis). Meningitis can cause deafness and brain damage, and it can be fatal. Anyone can get pneumococcal disease, but children under 72 years of age, people with certain medical conditions, adults over 41 years of age, and cigarette smokers are at the highest risk. About 18,000 older adults die each year from pneumococcal disease in the [place]. Treatment of pneumococcal infections with penicillin and other drugs used to be more effective. But some strains of the disease have become resistant to these drugs. This makes prevention of the disease, through vaccination, even more important. 2. Pneumococcal polysaccharide vaccine (PPSV23) Pneumococcal polysaccharide vaccine (PPSV23) protects against 23 types of pneumococcal bacteria. It will not prevent all pneumococcal disease. PPSV23 is recommended for:  All adults 3 years of age and older,  Anyone 2 through 33 years of age with certain long-term health problems,  Anyone 2 through 33 years of age with a weakened immune system,  Adults 8 through 33 years of age who smoke cigarettes or have asthma. Most people need only one dose of PPSV. A second dose is recommended for certain high-risk groups. People 47 and older should get a dose even if they have gotten one or more doses of the vaccine before they turned 65. Your healthcare provider can give you more information about these  recommendations. Most healthy adults develop protection within 2 to 3 weeks of getting the shot. 3. Some people should not get this vaccine  Anyone who has had a life-threatening allergic reaction to PPSV should not get another dose.  Anyone who has a severe allergy to any component of PPSV should not receive it. Tell your provider if you have any severe allergies.  Anyone who is moderately or severely ill when the shot is scheduled may be asked to wait until they recover before getting the vaccine. Someone with a mild illness can usually be vaccinated.  Children less than 3 years of age should not receive this vaccine.  There is no evidence that PPSV is harmful to either a pregnant woman or to her fetus. However, as a precaution, women who need the vaccine should be vaccinated before becoming pregnant, if possible. 4. Risks of a vaccine reaction With any medicine, including vaccines, there is a chance of side effects. These are usually mild and go away on their own, but serious reactions are also possible. About half of people who get PPSV have mild side effects, such as redness or pain where the shot is given, which go away within about two days. Less than 1 out of 100 people develop a fever, muscle aches, or more severe local reactions. Problems that could happen after any vaccine:   People sometimes faint after a medical procedure, including vaccination. Sitting or lying down for about 15 minutes can help prevent fainting, and injuries caused by a fall. Tell your doctor if you feel dizzy, or have vision changes or ringing in the ears.  Some people get severe pain  in the shoulder and have difficulty moving the arm where a shot was given. This happens very rarely.  Any medication can cause a severe allergic reaction. Such reactions from a vaccine are very rare, estimated at about 1 in a million doses, and would happen within a few minutes to a few hours after the vaccination. As with any  medicine, there is a very remote chance of a vaccine causing a serious injury or death. The safety of vaccines is always being monitored. For more information, visit: http://floyd.org/www.cdc.gov/vaccinesafety/ 5. What if there is a serious reaction? What should I look for?  Look for anything that concerns you, such as signs of a severe allergic reaction, very high fever, or unusual behavior. Signs of a severe allergic reaction can include hives, swelling of the face and throat, difficulty breathing, a fast heartbeat, dizziness, and weakness. These would usually start a few minutes to a few hours after the vaccination. What should I do?  If you think it is a severe allergic reaction or other emergency that can't wait, call 9-1-1 or get to the nearest hospital. Otherwise, call your doctor. Afterward, the reaction should be reported to the Vaccine Adverse Event Reporting System (VAERS). Your doctor might file this report, or you can do it yourself through the VAERS web site at www.vaers.LAgents.nohhs.gov, or by calling 1-(409)842-7796. VAERS does not give medical advice.  6. How can I learn more?  Ask your doctor. He or she can give you the vaccine package insert or suggest other sources of information.  Call your local or state health department.  Contact the Centers for Disease Control and Prevention (CDC):  Call 514-240-39411-337-649-9938 (1-800-CDC-INFO) or  Visit CDC's website at PicCapture.uywww.cdc.gov/vaccines CDC Pneumococcal Polysaccharide Vaccine VIS (09/12/13) This information is not intended to replace advice given to you by your health care provider. Make sure you discuss any questions you have with your health care provider. Document Released: 03/05/2006 Document Revised: 01/27/2016 Document Reviewed: 01/27/2016 Elsevier Interactive Patient Education  2017 [employer]

## 2016-07-22 LAB — MICROALBUMIN / CREATININE URINE RATIO
CREATININE, URINE: 292 mg/dL (ref 20–370)
MICROALB UR: 4.5 mg/dL
MICROALB/CREAT RATIO: 15 ug/mg{creat} (ref <30)

## 2016-07-24 ENCOUNTER — Telehealth: Payer: Self-pay

## 2016-07-24 NOTE — Telephone Encounter (Signed)
Contacted pt to go over lab results pt didn't answer and was unable to lvm  

## 2016-08-10 ENCOUNTER — Ambulatory Visit: Payer: Self-pay | Admitting: Pharmacist

## 2016-08-17 ENCOUNTER — Ambulatory Visit: Payer: Self-pay | Admitting: Pharmacist

## 2016-08-17 NOTE — Progress Notes (Deleted)
    S:     No chief complaint on file.   Patient arrives ***.  Presents for diabetes evaluation, education, and management at the request of ***. Patient was referred on ***.  Patient was last seen by Primary Care Provider on ***.   Patient reports Diabetes was diagnosed in ***.   Family/Social History:   Patient {Actions; denies-reports:120008} adherence with medications.  Current diabetes medications include: Current hypertension medications include:   Patient {Actions; denies-reports:120008} hypoglycemic events.  Patient reported dietary habits: Eats *** meals/day Breakfast:*** Lunch:*** Dinner:*** Snacks:*** Drinks:***  Patient reported exercise habits:    Patient {Actions; denies-reports:120008} nocturia.  Patient {Actions; denies-reports:120008} neuropathy. Patient {Actions; denies-reports:120008} visual changes. Patient {Actions; denies-reports:120008} self foot exams.   Benedict [email or web address]   O:  Physical Exam   ROS   Lab Results  Component Value Date   HGBA1C 14.1 (H) 05/30/2016   There were no vitals filed for this visit.  Home fasting CBG: ***  2 hour post-prandial/random CBG: ***.  10 year ASCVD risk: ***.  A/P: Diabetes longstanding/newly diagnosed currently ***. Patient {Actions; denies-reports:120008} hypoglycemic events and is able to verbalize appropriate hypoglycemia management plan. Patient {Actions; denies-reports:120008} adherence with medication. Control is suboptimal due to ***.  PICK 1 OF THE FOLLOWING 4 (DELETE the others AND THEN DELETE THIS LINE OF TEXT)  {Meds adjust:18428} basal insulin Lantus (insulin glargine). Patient will continue to titrate 1 unit/day if fasting CBGs > 100mg /dl until fasting CBGs reach goal or next visit.    {Meds adjust:18428} basal insulin Lantus (insulin glargine) to ***. {Meds adjust:18428} rapid insulin Novolog (insulin aspart) to ***.   {Meds adjust:18428} Victoza (liraglutide) to ***.   {Meds  adjust:18428} Trulicity (dulaglutide) to ***.   {Meds adjust:18428} Jardiance (empagliflozin) to ***.  Next A1C anticipated ***.    ASCVD risk greater than 7.5%. {Meds adjust:18428} Aspirin *** mg and {Meds adjust:18428} ***statin *** mg.   Hypertension longstanding/newly diagnosed currently ***.  Patient {Actions; denies-reports:120008} adherence with medication. Control is suboptimal due to ***.  Written patient instructions provided.  Total time in face to face counseling *** minutes.   Follow up in Pharmacist Clinic Visit ***.   Patient seen with ***

## 2016-08-26 ENCOUNTER — Encounter (HOSPITAL_COMMUNITY): Payer: Self-pay | Admitting: Emergency Medicine

## 2016-08-26 ENCOUNTER — Inpatient Hospital Stay (HOSPITAL_COMMUNITY)
Admission: EM | Admit: 2016-08-26 | Discharge: 2016-08-28 | DRG: 638 | Disposition: A | Discharge status: Home / Self Care | Payer: Self-pay | Attending: Internal Medicine | Admitting: Internal Medicine

## 2016-08-26 DIAGNOSIS — Z9114 Patient's other noncompliance with medication regimen: Secondary | ICD-10-CM

## 2016-08-26 DIAGNOSIS — Z794 Long term (current) use of insulin: Secondary | ICD-10-CM

## 2016-08-26 DIAGNOSIS — N179 Acute kidney failure, unspecified: Secondary | ICD-10-CM | POA: Diagnosis present

## 2016-08-26 DIAGNOSIS — E876 Hypokalemia: Secondary | ICD-10-CM | POA: Diagnosis present

## 2016-08-26 DIAGNOSIS — E1010 Type 1 diabetes mellitus with ketoacidosis without coma: Principal | ICD-10-CM | POA: Diagnosis present

## 2016-08-26 DIAGNOSIS — Z87891 Personal history of nicotine dependence: Secondary | ICD-10-CM

## 2016-08-26 DIAGNOSIS — E1159 Type 2 diabetes mellitus with other circulatory complications: Secondary | ICD-10-CM | POA: Diagnosis present

## 2016-08-26 DIAGNOSIS — E875 Hyperkalemia: Secondary | ICD-10-CM | POA: Diagnosis present

## 2016-08-26 DIAGNOSIS — I10 Essential (primary) hypertension: Secondary | ICD-10-CM | POA: Diagnosis present

## 2016-08-26 DIAGNOSIS — E860 Dehydration: Secondary | ICD-10-CM | POA: Diagnosis present

## 2016-08-26 DIAGNOSIS — Z833 Family history of diabetes mellitus: Secondary | ICD-10-CM

## 2016-08-26 DIAGNOSIS — D72829 Elevated white blood cell count, unspecified: Secondary | ICD-10-CM | POA: Diagnosis present

## 2016-08-26 LAB — URINALYSIS, ROUTINE W REFLEX MICROSCOPIC
Bacteria, UA: NONE SEEN
Bilirubin Urine: NEGATIVE
KETONES UR: 80 mg/dL — AB
Leukocytes, UA: NEGATIVE
NITRITE: NEGATIVE
PROTEIN: NEGATIVE mg/dL
RBC / HPF: NONE SEEN RBC/hpf (ref 0–5)
Specific Gravity, Urine: 1.024 (ref 1.005–1.030)
pH: 5 (ref 5.0–8.0)

## 2016-08-26 LAB — BASIC METABOLIC PANEL
ANION GAP: 16 — AB (ref 5–15)
Anion gap: 10 (ref 5–15)
BUN: 14 mg/dL (ref 6–20)
BUN: 15 mg/dL (ref 6–20)
BUN: 14 mg/dL (ref 6–20)
BUN: 11 mg/dL (ref 6–20)
BUN: 10 mg/dL (ref 6–20)
CALCIUM: 8.4 mg/dL — AB (ref 8.9–10.3)
CALCIUM: 8.4 mg/dL — AB (ref 8.9–10.3)
CALCIUM: 7.6 mg/dL — AB (ref 8.9–10.3)
CALCIUM: 7.7 mg/dL — AB (ref 8.9–10.3)
CHLORIDE: 96 mmol/L — AB (ref 101–111)
CHLORIDE: 108 mmol/L (ref 101–111)
CHLORIDE: 112 mmol/L — AB (ref 101–111)
CO2: <7 mmol/L — ABNORMAL LOW (ref 22–32)
CO2: 9 mmol/L — ABNORMAL LOW (ref 22–32)
CO2: 13 mmol/L — AB (ref 22–32)
CREATININE: 1.36 mg/dL — AB (ref 0.61–1.24)
CREATININE: 1.11 mg/dL (ref 0.61–1.24)
CREATININE: 0.94 mg/dL (ref 0.61–1.24)
Calcium: 9.3 mg/dL (ref 8.9–10.3)
Chloride: 116 mmol/L — ABNORMAL HIGH (ref 101–111)
Chloride: 117 mmol/L — ABNORMAL HIGH (ref 101–111)
Creatinine, Ser: 1.41 mg/dL — ABNORMAL HIGH (ref 0.61–1.24)
Creatinine, Ser: 1.45 mg/dL — ABNORMAL HIGH (ref 0.61–1.24)
GFR calc Af Amer: >60 mL/min (ref >60)
GFR calc Af Amer: >60 mL/min (ref >60)
GFR calc non Af Amer: >60 mL/min (ref >60)
GFR calc non Af Amer: >60 mL/min (ref >60)
GFR calc non Af Amer: >60 mL/min (ref >60)
GFR calc non Af Amer: >60 mL/min (ref >60)
Glucose, Bld: 611 mg/dL (ref 65–99)
Glucose, Bld: 504 mg/dL (ref 65–99)
Glucose, Bld: 325 mg/dL — ABNORMAL HIGH (ref 65–99)
Glucose, Bld: 202 mg/dL — ABNORMAL HIGH (ref 65–99)
Glucose, Bld: 166 mg/dL — ABNORMAL HIGH (ref 65–99)
Potassium: 5.9 mmol/L — ABNORMAL HIGH (ref 3.5–5.1)
Potassium: 4.9 mmol/L (ref 3.5–5.1)
Potassium: 4.7 mmol/L (ref 3.5–5.1)
Potassium: 3.2 mmol/L — ABNORMAL LOW (ref 3.5–5.1)
Potassium: 3 mmol/L — ABNORMAL LOW (ref 3.5–5.1)
SODIUM: 130 mmol/L — AB (ref 135–145)
Sodium: 139 mmol/L (ref 135–145)
Sodium: 139 mmol/L (ref 135–145)
Sodium: 141 mmol/L (ref 135–145)
Sodium: 140 mmol/L (ref 135–145)

## 2016-08-26 LAB — CBG MONITORING, ED
Glucose-Capillary: 554 mg/dL (ref 65–99)
Glucose-Capillary: 569 mg/dL (ref 65–99)
Glucose-Capillary: 423 mg/dL — ABNORMAL HIGH (ref 65–99)

## 2016-08-26 LAB — GLUCOSE, CAPILLARY
GLUCOSE-CAPILLARY: 352 mg/dL — AB (ref 65–99)
GLUCOSE-CAPILLARY: 241 mg/dL — AB (ref 65–99)
GLUCOSE-CAPILLARY: 185 mg/dL — AB (ref 65–99)
GLUCOSE-CAPILLARY: 105 mg/dL — AB (ref 65–99)
Glucose-Capillary: 280 mg/dL — ABNORMAL HIGH (ref 65–99)
Glucose-Capillary: 233 mg/dL — ABNORMAL HIGH (ref 65–99)
Glucose-Capillary: 180 mg/dL — ABNORMAL HIGH (ref 65–99)
Glucose-Capillary: 136 mg/dL — ABNORMAL HIGH (ref 65–99)
Glucose-Capillary: 95 mg/dL (ref 65–99)

## 2016-08-26 LAB — CBC
HCT: 40.5 % (ref 39.0–52.0)
Hemoglobin: 14.3 g/dL (ref 13.0–17.0)
MCH: 30.5 pg (ref 26.0–34.0)
MCHC: 35.3 g/dL (ref 30.0–36.0)
MCV: 86.4 fL (ref 78.0–100.0)
PLATELETS: 313 10*3/uL (ref 150–400)
RBC: 4.69 MIL/uL (ref 4.22–5.81)
RDW: 12.4 % (ref 11.5–15.5)
WBC: 13.2 10*3/uL — ABNORMAL HIGH (ref 4.0–10.5)

## 2016-08-26 LAB — MRSA PCR SCREENING: MRSA by PCR: NEGATIVE

## 2016-08-26 MED ORDER — SODIUM CHLORIDE 0.9 % IV SOLN
INTRAVENOUS | Status: DC
  Administered 2016-08-26: 5.1 [IU]/h via INTRAVENOUS
  Filled 2016-08-26: qty 2.5

## 2016-08-26 MED ORDER — SODIUM CHLORIDE 0.9 % IV SOLN
INTRAVENOUS | Status: DC
  Administered 2016-08-26: 12:00:00 via INTRAVENOUS

## 2016-08-26 MED ORDER — SODIUM CHLORIDE 0.9 % IV BOLUS (SEPSIS): 1000.0000 mL | Freq: Once | INTRAVENOUS | Status: DC

## 2016-08-26 MED ORDER — DEXTROSE-NACL 5-0.45 % IV SOLN
INTRAVENOUS | Status: DC
  Administered 2016-08-26 – 2016-08-27 (×2): via INTRAVENOUS
  Administered 2016-08-27: 1000 mL via INTRAVENOUS
  Administered 2016-08-27: 09:00:00 via INTRAVENOUS
  Administered 2016-08-28: 1000 mL via INTRAVENOUS

## 2016-08-26 MED ORDER — CHLORHEXIDINE GLUCONATE 0.12 % MT SOLN
15.0000 mL | Freq: Two times a day (BID) | OROMUCOSAL | Status: DC
  Administered 2016-08-26: 15 mL via OROMUCOSAL
  Filled 2016-08-26: qty 15

## 2016-08-26 MED ORDER — SODIUM CHLORIDE 0.9 % IV BOLUS (SEPSIS)
1000.0000 mL | Freq: Once | INTRAVENOUS | Status: AC
  Administered 2016-08-26: 1000 mL via INTRAVENOUS

## 2016-08-26 MED ORDER — SODIUM CHLORIDE 0.9 % IV SOLN
INTRAVENOUS | Status: DC
  Filled 2016-08-26: qty 2.5

## 2016-08-26 MED ORDER — ENOXAPARIN SODIUM 40 MG/0.4ML ~~LOC~~ SOLN
40.0000 mg | SUBCUTANEOUS | Status: DC
  Administered 2016-08-26 – 2016-08-27 (×2): 40 mg via SUBCUTANEOUS
  Filled 2016-08-26 (×2): qty 0.4

## 2016-08-26 MED ORDER — ORAL CARE MOUTH RINSE: 15.0000 mL | Freq: Two times a day (BID) | OROMUCOSAL | Status: DC

## 2016-08-26 MED ORDER — SODIUM CHLORIDE 0.9 % IV SOLN
INTRAVENOUS | Status: DC
  Administered 2016-08-26: 18:00:00 via INTRAVENOUS

## 2016-08-26 NOTE — ED Notes (Signed)
Patient's  pocket knife with security.

## 2016-08-26 NOTE — H&P (Signed)
History and Physical  [name] [name] XVQ:[id number] DOB: [birth year] DOA: 08/26/2016  Referring physician: Dr Colvin Caroli, ED physician PCP: Minerva Ends, MD  Outpatient Specialists:    Patient Coming From: Home  Chief Complaint: Nausea/vomiting, high blood glucose level  HPI: [name] [name] is a 31 y.o. male with a history of type 1 diabetes with 3 admissions over the past 6 months for DKA. Patient presents with nausea and vomiting since last night. Symptoms are worsening. Patient normally takes Lantus 36 units, but hasn't had his dose yesterday. He woke up this morning feeling very dehydrated and weak. He came to the hospital for evaluation. No provoking or palpating factors.  Emergency Department Course: Patient started on IV fluids. Insulin drip started in the ER. Blood glucose level greater than 600 with bicarbonate less than 7. Anion gap calculated due to bicarbonate being less than 7. Slightly elevated white count of 13.2, but no focal signs of infection.   Review of Systems:   Pt denies any fevers, chills, nausea, vomiting, diarrhea, constipation, abdominal pain, shortness of breath, dyspnea on exertion, orthopnea, cough, wheezing, palpitations, headache, vision changes, lightheadedness, dizziness, melena, rectal bleeding.  Review of systems are otherwise negative  Past Medical History:  Diagnosis Date  . Diabetes mellitus without complication ([place]) Dx 0932  . Renal insufficiency 2011   pt was on dialysis for a short time approx 2011, pt is no longer on dialysis   No past surgical history on file. Social History:  reports that he quit smoking about 2 years ago. He has never used smokeless tobacco. He reports that he does not drink alcohol or use drugs. Patient lives at Home  No Known Allergies  Family History  Problem Relation Age of Onset  . Diabetes Father   . Diabetes Maternal Grandmother   . Heart disease Neg Hx   . Hypertension Neg Hx       Prior to Admission  medications   Medication Sig Start Date End Date Taking? Authorizing Provider  Insulin Glargine (LANTUS) 100 UNIT/ML Solostar Pen Inject 40 Units into the skin daily at 10 pm. 07/12/16  Yes Florencia Reasons, MD  insulin lispro (HUMALOG) 100 UNIT/ML KiwkPen Inject 0.15 mLs (15 Units total) into the skin 3 (three) times daily. For PASS 07/21/16  Yes [name], MD  Blood Glucose Monitoring Suppl (TRUE METRIX METER) w/Device KIT 1 each by Does not apply route as needed. Patient not taking: Reported on 08/26/2016 07/12/16   Florencia Reasons, MD  glucose blood (TRUE METRIX BLOOD GLUCOSE TEST) test strip 1 each by Other route 3 (three) times daily. Patient not taking: Reported on 08/26/2016 07/12/16   Florencia Reasons, MD  Insulin Syringe-Needle U-100 (B-D INS SYRINGE 0.5CC/31GX5/16) 31G X 5/16" 0.5 ML MISC 1 each by Does not apply route 2 (two) times daily. Patient not taking: Reported on 07/12/2016 03/27/16   [name], MD  TRUEPLUS LANCETS 28G MISC 1 each by Does not apply route 3 (three) times daily. Patient not taking: Reported on 07/12/2016 03/11/16   Florencia Reasons, MD    Physical Exam: BP (!) 142/87 (BP Location: Right Arm) Comment: Simultaneous filing. User may not have seen previous data.  Pulse (!) 132 Comment: Simultaneous filing. User may not have seen previous data.  Temp 98.5 F (36.9 C)   Resp 20 Comment: Simultaneous filing. User may not have seen previous data.  SpO2 100% Comment: Simultaneous filing. User may not have seen previous data.  General: Young black male.. Awake and alert and  oriented x3. No acute cardiopulmonary distress.  HEENT: Normocephalic atraumatic.  Right and left ears normal in appearance.  Pupils equal, round, reactive to light. Extraocular muscles are intact. Sclerae anicteric and noninjected.  Dry mucosal membranes. No mucosal lesions.  Neck: Neck supple without lymphadenopathy. No carotid bruits. No masses palpated.  Cardiovascular: Regular rate with normal S1-S2 sounds. No murmurs, rubs,  gallops auscultated. No JVD.  Respiratory: Good respiratory effort with no wheezes, rales, rhonchi. Lungs clear to auscultation bilaterally.  No accessory muscle use. Abdomen: Soft, nontender, nondistended. Active bowel sounds. No masses or hepatosplenomegaly  Skin: No rashes, lesions, or ulcerations.  Dry, warm to touch. 2+ dorsalis pedis and radial pulses. Musculoskeletal: No calf or leg pain. All major joints not erythematous nontender.  No upper or lower joint deformation.  Good ROM.  No contractures  Psychiatric: Intact judgment and insight. Pleasant and cooperative. Neurologic: No focal neurological deficits. Strength is 5/5 and symmetric in upper and lower extremities.  Cranial nerves II through XII are grossly intact.           Labs on Admission: I have personally reviewed following labs and imaging studies  CBC:  Recent Labs Lab 08/26/16 0922  WBC 13.2*  HGB 14.3  HCT 40.5  MCV 86.4  PLT 161   Basic Metabolic Panel:  Recent Labs Lab 08/26/16 0922  NA 130*  K 5.9*  CL 96*  CO2 <7*  GLUCOSE 611*  BUN 14  CREATININE 1.41*  CALCIUM 9.3   GFR: CrCl cannot be calculated (Unknown ideal weight.). Liver Function Tests: No results for input(s): AST, ALT, ALKPHOS, BILITOT, PROT, ALBUMIN in the last 168 hours. No results for input(s): LIPASE, AMYLASE in the last 168 hours. No results for input(s): AMMONIA in the last 168 hours. Coagulation Profile: No results for input(s): INR, PROTIME in the last 168 hours. Cardiac Enzymes: No results for input(s): CKTOTAL, CKMB, CKMBINDEX, TROPONINI in the last 168 hours. BNP (last 3 results) No results for input(s): PROBNP in the last 8760 hours. HbA1C: No results for input(s): HGBA1C in the last 72 hours. CBG:  Recent Labs Lab 08/26/16 0920  GLUCAP 554*   Lipid Profile: No results for input(s): CHOL, HDL, LDLCALC, TRIG, CHOLHDL, LDLDIRECT in the last 72 hours. Thyroid Function Tests: No results for input(s): TSH,  T4TOTAL, FREET4, T3FREE, THYROIDAB in the last 72 hours. Anemia Panel: No results for input(s): VITAMINB12, FOLATE, FERRITIN, TIBC, IRON, RETICCTPCT in the last 72 hours. Urine analysis:    Component Value Date/Time   COLORURINE STRAW (A) 08/26/2016 0952   APPEARANCEUR CLEAR 08/26/2016 0952   LABSPEC 1.024 08/26/2016 0952   PHURINE 5.0 08/26/2016 0952   GLUCOSEU >=500 (A) 08/26/2016 0952   HGBUR SMALL (A) 08/26/2016 0952   BILIRUBINUR NEGATIVE 08/26/2016 0952   BILIRUBINUR Negative 02/07/2016 1528   KETONESUR 80 (A) 08/26/2016 0952   PROTEINUR NEGATIVE 08/26/2016 0952   UROBILINOGEN 0.2 02/07/2016 1528   UROBILINOGEN 0.2 03/22/2015 0913   NITRITE NEGATIVE 08/26/2016 0952   LEUKOCYTESUR NEGATIVE 08/26/2016 0952   Sepsis Labs: '@LABRCNTIP'$ (procalcitonin:4,lacticidven:4) )No results found for this or any previous visit (from the past 240 hour(s)).   Radiological Exams on Admission: No results found.   Assessment/Plan: Principal Problem:   Diabetic ketoacidosis without coma associated with type 1 diabetes mellitus ([place]) Active Problems:   High blood pressure   Hyperkalemia   Leukocytosis   AKI (acute kidney injury) ([place])    This patient was discussed with the ED physician, including pertinent vitals, physical exam findings,  labs, and imaging.  We also discussed care given by the ED provider.  #1 DKA without coma  Admit to stepdown  Insulin drip  CBGs every hour  BMP every 4 hours  We'll check ABG - pH is 6.9 or less, will need bicarbonate  We'll bolus with a second liter of fluid and continue to aggressively rehydrate #2 hyperkalemia  This will decrease with aggressive hydration and aggressive treatment of the patient's DKA #3 acute renal injury  We'll continue to trend #4 hypertension  Not currently on treatment  Will observe for now #5 leukocytosis   No infectious etiology at this point.  DVT prophylaxis: Lovenox Consultants: None Code Status: Full  code Family Communication: None  Disposition Plan: Patient should be able to return home.   Truett Mainland, DO [facility] Pager [phone]  If 7PM-7AM, please contact night-coverage [email or web address] Password [email or web address]

## 2016-08-26 NOTE — ED Provider Notes (Signed)
[place] DEPT Provider Note   CSN: [id number] Arrival date & time: 08/26/16  1779     History   Chief Complaint Chief Complaint  Patient presents with  . Nausea  . Tachycardia  . Hyperglycemia  . Dehydration    HPI [name] [name] is a 31 y.o. male.  HPI Patient reports that symptoms started very suddenly. He was taking his usual evening dose of Lantus 36 units up until Thursday evening. Friday he had to go to work at 1 PM and took an early dose of Lantus 15 units around 12 PM. Patient reports that he ran out of Lantus at that point so his last dose of Lantus was a half dose approximately 24 hours ago. He states that last night about 12:30 he started to get nausea and vomited twice. Charted eating ice chips and trying to keep up with his fluids. He denied he started having a lot of urination. He reports he didn't feel too badly until this morning he was talking with his mom and he just rolled over suddenly felt nauseated and then felt this general sense of being dehydrated and not feeling well. Review of systems is otherwise negative. Patient denies drug use. He denies recent general illness. Past Medical History:  Diagnosis Date  . Diabetes mellitus without complication ([place]) Dx 3903  . Renal insufficiency 2011   pt was on dialysis for a short time approx 2011, pt is no longer on dialysis    Patient Active Problem List   Diagnosis Date Noted  . AKI (acute kidney injury) ([place]) 07/11/2016  . Hyperkalemia 03/09/2016  . Renal insufficiency 03/09/2016  . Leukocytosis 03/09/2016  . Diabetic ketoacidosis without coma associated with type 1 diabetes mellitus ([place])   . Non-intractable vomiting with nausea   . Chest pain 12/18/2015  . DKA, type 1, not at goal [facility]) 12/02/2015  . DKA (diabetic ketoacidoses) ([place]) 12/01/2015  . Marijuana use 07/22/2015  . Proteinuria with type 1 diabetes mellitus ([place]) 07/22/2015  . High blood pressure 07/22/2015  . Uncontrolled type 1 diabetes  mellitus with ketoacidosis without coma ([place])   . Hypocalcemia   . Acute renal failure ([place])   . Hypokalemia   . Hypomagnesemia   . Abnormal EKG 06/15/2015  . Sinus tachycardia   . Tachypnea   . Tinea pedis 12/17/2014  . Adjustment disorder with mixed anxiety and depressed mood 09/03/2014  . Protein-calorie malnutrition, severe ([place]) 07/06/2014  . DM (diabetes mellitus), type 1, uncontrolled ([place]) 05/01/2014  . HLD (hyperlipidemia) 04/26/2014  . Substance abuse   . Dehydration 07/07/2013  . Fatigue 03/10/2012    No past surgical history on file.     Home Medications    Prior to Admission medications   Medication Sig Start Date End Date Taking? Authorizing Provider  Insulin Glargine (LANTUS) 100 UNIT/ML Solostar Pen Inject 40 Units into the skin daily at 10 pm. 07/12/16  Yes Florencia Reasons, MD  insulin lispro (HUMALOG) 100 UNIT/ML KiwkPen Inject 0.15 mLs (15 Units total) into the skin 3 (three) times daily. For PASS 07/21/16  Yes [name], MD  Blood Glucose Monitoring Suppl (TRUE METRIX METER) w/Device KIT 1 each by Does not apply route as needed. Patient not taking: Reported on 08/26/2016 07/12/16   Florencia Reasons, MD  glucose blood (TRUE METRIX BLOOD GLUCOSE TEST) test strip 1 each by Other route 3 (three) times daily. Patient not taking: Reported on 08/26/2016 07/12/16   Florencia Reasons, MD  Insulin Syringe-Needle U-100 (B-D INS SYRINGE 0.5CC/31GX5/16)  31G X 5/16" 0.5 ML MISC 1 each by Does not apply route 2 (two) times daily. Patient not taking: Reported on 07/12/2016 03/27/16   [name], MD  TRUEPLUS LANCETS 28G MISC 1 each by Does not apply route 3 (three) times daily. Patient not taking: Reported on 07/12/2016 03/11/16   Florencia Reasons, MD    Family History Family History  Problem Relation Age of Onset  . Diabetes Father   . Diabetes Maternal Grandmother   . Heart disease Neg Hx   . Hypertension Neg Hx     Social History Social History  Substance Use Topics  . Smoking status: Former  Smoker    Quit date: 09/19/2013  . Smokeless tobacco: Never Used  . Alcohol use No     Allergies   Patient has no known allergies.   Review of Systems Review of Systems 10 Systems reviewed and are negative for acute change except as noted in the HPI.   Physical Exam Updated Vital Signs BP 132/78   Pulse (!) 133   Temp 98.5 F (36.9 C)   Resp 18   SpO2 100%   Physical Exam  Constitutional: He is oriented to person, place, and time.  Patient is alert and nontoxic. Mental status is clear. Patient smells very ketotic. Generally very thin. No respiratory distress.  HENT:  Head: Normocephalic and atraumatic.  Mouth/Throat: Oropharynx is clear and moist.  Posterior oropharynx is clear. Mucous members are moist. Poor dentition. No facial swelling. No acute dental pain.  Eyes: Conjunctivae and EOM are normal. Pupils are equal, round, and reactive to light.  Neck: Neck supple.  Cardiovascular:  Tachycardia. Distal pulses intact.  Pulmonary/Chest: Effort normal and breath sounds normal.  Abdominal: Soft. Bowel sounds are normal. He exhibits no distension. There is no tenderness. There is no guarding.  Musculoskeletal: Normal range of motion. He exhibits no edema or tenderness.  Extremities are fairly cachectic.  Neurological: He is alert and oriented to person, place, and time. No cranial nerve deficit. Coordination normal.  Skin: Skin is warm and dry.  Psychiatric: He has a normal mood and affect.     ED Treatments / Results  Labs (all labs ordered are listed, but only abnormal results are displayed) Labs Reviewed  BASIC METABOLIC PANEL - Abnormal; Notable for the following:       Result Value   Sodium 130 (*)    Potassium 5.9 (*)    Chloride 96 (*)    CO2 <7 (*)    Glucose, Bld 611 (*)    Creatinine, Ser 1.41 (*)    All other components within normal limits  URINALYSIS, ROUTINE W REFLEX MICROSCOPIC - Abnormal; Notable for the following:    Color, Urine STRAW (*)     Glucose, UA >=500 (*)    Hgb urine dipstick SMALL (*)    Ketones, ur 80 (*)    Squamous Epithelial / LPF 0-5 (*)    All other components within normal limits  CBC - Abnormal; Notable for the following:    WBC 13.2 (*)    All other components within normal limits  CBG MONITORING, ED - Abnormal; Notable for the following:    Glucose-Capillary 554 (*)    All other components within normal limits  CBG MONITORING, ED  I-STAT VENOUS BLOOD GAS, ED    EKG  EKG Interpretation None       Radiology No results found.  Procedures Procedures (including critical care time) CRITICAL CARE Performed by: Charlesetta Shanks  Total critical care time: 30 minutes  Critical care time was exclusive of separately billable procedures and treating other patients.  Critical care was necessary to treat or prevent imminent or life-threatening deterioration.  Critical care was time spent personally by me on the following activities: development of treatment plan with patient and/or surrogate as well as nursing, discussions with consultants, evaluation of patient's response to treatment, examination of patient, obtaining history from patient or surrogate, ordering and performing treatments and interventions, ordering and review of laboratory studies, ordering and review of radiographic studies, pulse oximetry and re-evaluation of patient's condition. Medications Ordered in ED Medications  insulin regular (NOVOLIN R,HUMULIN R) 250 Units in sodium chloride 0.9 % 250 mL (1 Units/mL) infusion (not administered)  sodium chloride 0.9 % bolus 1,000 mL (not administered)    And  0.9 %  sodium chloride infusion (not administered)     Initial Impression / Assessment and Plan / ED Course  I have reviewed the triage vital signs and the nursing notes.  Pertinent labs & imaging results that were available during my care of the patient were reviewed by me and considered in my medical decision making (see chart for  details).      Final Clinical Impressions(s) / ED Diagnoses   Final diagnoses:  Diabetic ketoacidosis without coma associated with type 1 diabetes mellitus ([place])   Despite significant metabolic derangement. Patient maintains clear mental status and normal respiratory status. He is started on insulin protocol for DKA. Plan for admission. Review of systems is negative for other etiology. There is one missed dose of Lantus. New Prescriptions New Prescriptions   No medications on file     Charlesetta Shanks, MD 08/26/16 1126

## 2016-08-26 NOTE — ED Notes (Signed)
Attempted report 

## 2016-08-26 NOTE — ED Triage Notes (Signed)
Pt reports he is a type 1 diabetic with hx of DKA. Pt reports not feeling well for 3 days, with new onset vomiting 12 hours ago. Pt reports 3 episodes. Pt states his CBGs have been in the 200s. Afebrile, tachycardic at triage. CBG 554.

## 2016-08-26 NOTE — Progress Notes (Signed)
Patient Name: [name] [name], male   DOB: [birth year], 31 y.o.  MRN: [id number]  Reviewed last labs. Blood sugar has improved, as well as the patient's hyperkalemia. Bicarb still not calculable. Anion gap has improved otherwise (calculated to 20 from 26 at admission). Will increase IVF. Recheck VBG and BMP.  Levie Heritage, DO 08/26/2016 5:23 PM

## 2016-08-26 NOTE — ED Notes (Signed)
MD at bedside,

## 2016-08-27 ENCOUNTER — Encounter (HOSPITAL_COMMUNITY): Payer: Self-pay | Admitting: Internal Medicine

## 2016-08-27 DIAGNOSIS — E876 Hypokalemia: Secondary | ICD-10-CM

## 2016-08-27 LAB — BASIC METABOLIC PANEL
Anion gap: 9 (ref 5–15)
Anion gap: 8 (ref 5–15)
Anion gap: 6 (ref 5–15)
BUN: 11 mg/dL (ref 6–20)
BUN: 9 mg/dL (ref 6–20)
BUN: 8 mg/dL (ref 6–20)
CALCIUM: 7.4 mg/dL — AB (ref 8.9–10.3)
CHLORIDE: 112 mmol/L — AB (ref 101–111)
CHLORIDE: 112 mmol/L — AB (ref 101–111)
CO2: 18 mmol/L — ABNORMAL LOW (ref 22–32)
CO2: 15 mmol/L — AB (ref 22–32)
CO2: 15 mmol/L — AB (ref 22–32)
CREATININE: 0.75 mg/dL (ref 0.61–1.24)
CREATININE: 0.55 mg/dL — AB (ref 0.61–1.24)
Calcium: 7.3 mg/dL — ABNORMAL LOW (ref 8.9–10.3)
Calcium: 7.4 mg/dL — ABNORMAL LOW (ref 8.9–10.3)
Chloride: 114 mmol/L — ABNORMAL HIGH (ref 101–111)
Creatinine, Ser: 0.71 mg/dL (ref 0.61–1.24)
GFR calc Af Amer: >60 mL/min (ref >60)
GFR calc Af Amer: >60 mL/min (ref >60)
GFR calc non Af Amer: >60 mL/min (ref >60)
GFR calc non Af Amer: >60 mL/min (ref >60)
GLUCOSE: 116 mg/dL — AB (ref 65–99)
GLUCOSE: 189 mg/dL — AB (ref 65–99)
GLUCOSE: 147 mg/dL — AB (ref 65–99)
POTASSIUM: 2.8 mmol/L — AB (ref 3.5–5.1)
POTASSIUM: 2.7 mmol/L — AB (ref 3.5–5.1)
Potassium: 3 mmol/L — ABNORMAL LOW (ref 3.5–5.1)
SODIUM: 135 mmol/L (ref 135–145)
SODIUM: 136 mmol/L (ref 135–145)
Sodium: 138 mmol/L (ref 135–145)

## 2016-08-27 LAB — PHOSPHORUS

## 2016-08-27 LAB — GLUCOSE, CAPILLARY
GLUCOSE-CAPILLARY: 155 mg/dL — AB (ref 65–99)
GLUCOSE-CAPILLARY: 186 mg/dL — AB (ref 65–99)
GLUCOSE-CAPILLARY: 133 mg/dL — AB (ref 65–99)
GLUCOSE-CAPILLARY: 163 mg/dL — AB (ref 65–99)
GLUCOSE-CAPILLARY: 147 mg/dL — AB (ref 65–99)
GLUCOSE-CAPILLARY: 161 mg/dL — AB (ref 65–99)
GLUCOSE-CAPILLARY: 211 mg/dL — AB (ref 65–99)
GLUCOSE-CAPILLARY: 178 mg/dL — AB (ref 65–99)
GLUCOSE-CAPILLARY: 178 mg/dL — AB (ref 65–99)
GLUCOSE-CAPILLARY: 114 mg/dL — AB (ref 65–99)
GLUCOSE-CAPILLARY: 109 mg/dL — AB (ref 65–99)
Glucose-Capillary: 118 mg/dL — ABNORMAL HIGH (ref 65–99)
Glucose-Capillary: 128 mg/dL — ABNORMAL HIGH (ref 65–99)
Glucose-Capillary: 128 mg/dL — ABNORMAL HIGH (ref 65–99)
Glucose-Capillary: 161 mg/dL — ABNORMAL HIGH (ref 65–99)
Glucose-Capillary: 169 mg/dL — ABNORMAL HIGH (ref 65–99)
Glucose-Capillary: 177 mg/dL — ABNORMAL HIGH (ref 65–99)
Glucose-Capillary: 183 mg/dL — ABNORMAL HIGH (ref 65–99)
Glucose-Capillary: 215 mg/dL — ABNORMAL HIGH (ref 65–99)
Glucose-Capillary: 266 mg/dL — ABNORMAL HIGH (ref 65–99)
Glucose-Capillary: 292 mg/dL — ABNORMAL HIGH (ref 65–99)

## 2016-08-27 LAB — RENAL FUNCTION PANEL
ALBUMIN: 2.6 g/dL — AB (ref 3.5–5.0)
Anion gap: 8 (ref 5–15)
BUN: 5 mg/dL — AB (ref 6–20)
CALCIUM: 7.2 mg/dL — AB (ref 8.9–10.3)
CO2: 17 mmol/L — AB (ref 22–32)
CREATININE: 0.67 mg/dL (ref 0.61–1.24)
Chloride: 107 mmol/L (ref 101–111)
GFR calc Af Amer: >60 mL/min (ref >60)
GFR calc non Af Amer: >60 mL/min (ref >60)
GLUCOSE: 271 mg/dL — AB (ref 65–99)
PHOSPHORUS: 1.3 mg/dL — AB (ref 2.5–4.6)
Potassium: 3.2 mmol/L — ABNORMAL LOW (ref 3.5–5.1)
SODIUM: 132 mmol/L — AB (ref 135–145)

## 2016-08-27 LAB — MAGNESIUM: Magnesium: 1.6 mg/dL — ABNORMAL LOW (ref 1.7–2.4)

## 2016-08-27 LAB — HIV ANTIBODY (ROUTINE TESTING W REFLEX): HIV Screen 4th Generation wRfx: NONREACTIVE

## 2016-08-27 MED ORDER — INSULIN ASPART 100 UNIT/ML ~~LOC~~ SOLN
10.0000 [IU] | Freq: Three times a day (TID) | SUBCUTANEOUS | Status: DC
  Administered 2016-08-28: 10 [IU] via SUBCUTANEOUS

## 2016-08-27 MED ORDER — DEXTROSE-NACL 5-0.45 % IV SOLN: INTRAVENOUS | Status: DC

## 2016-08-27 MED ORDER — INSULIN GLARGINE 100 UNIT/ML ~~LOC~~ SOLN
40.0000 [IU] | Freq: Every day | SUBCUTANEOUS | Status: DC
  Administered 2016-08-27: 40 [IU] via SUBCUTANEOUS
  Filled 2016-08-27 (×2): qty 0.4

## 2016-08-27 MED ORDER — MAGNESIUM SULFATE 4 GM/100ML IV SOLN
4.0000 g | Freq: Once | INTRAVENOUS | Status: AC
  Administered 2016-08-27: 4 g via INTRAVENOUS
  Filled 2016-08-27: qty 100

## 2016-08-27 MED ORDER — POTASSIUM PHOSPHATES 15 MMOLE/5ML IV SOLN
30.0000 mmol | Freq: Once | INTRAVENOUS | Status: AC
  Administered 2016-08-27: 30 mmol via INTRAVENOUS
  Filled 2016-08-27: qty 10

## 2016-08-27 MED ORDER — SODIUM CHLORIDE 0.9 % IV SOLN: INTRAVENOUS | Status: DC

## 2016-08-27 MED ORDER — DEXTROSE 5 % IV SOLN
30.0000 mmol | Freq: Once | INTRAVENOUS | Status: AC
  Administered 2016-08-27: 30 mmol via INTRAVENOUS
  Filled 2016-08-27: qty 10

## 2016-08-27 MED ORDER — SODIUM CHLORIDE 0.9 % IV SOLN
30.0000 meq | Freq: Once | INTRAVENOUS | Status: AC
  Administered 2016-08-27: 30 meq via INTRAVENOUS
  Filled 2016-08-27: qty 15

## 2016-08-27 MED ORDER — POTASSIUM CHLORIDE CRYS ER 20 MEQ PO TBCR
40.0000 meq | EXTENDED_RELEASE_TABLET | ORAL | Status: AC
  Administered 2016-08-27 (×2): 40 meq via ORAL
  Filled 2016-08-27 (×2): qty 2

## 2016-08-27 MED ORDER — DEXTROSE 50 % IV SOLN: 25.0000 mL | INTRAVENOUS | Status: DC | PRN

## 2016-08-27 MED ORDER — ONDANSETRON HCL 4 MG/2ML IJ SOLN: 4.0000 mg | Freq: Four times a day (QID) | INTRAMUSCULAR | Status: DC | PRN

## 2016-08-27 MED ORDER — LIVING WELL WITH DIABETES BOOK
Freq: Once | Status: AC
  Administered 2016-08-27: 16:00:00
  Filled 2016-08-27 (×2): qty 1

## 2016-08-27 MED ORDER — INSULIN ASPART 100 UNIT/ML ~~LOC~~ SOLN
0.0000 [IU] | Freq: Three times a day (TID) | SUBCUTANEOUS | Status: DC
  Administered 2016-08-27 – 2016-08-28 (×2): 2 [IU] via SUBCUTANEOUS

## 2016-08-27 MED ORDER — INSULIN REGULAR BOLUS VIA INFUSION
0.0000 [IU] | Freq: Three times a day (TID) | INTRAVENOUS | Status: DC
  Administered 2016-08-27: 3 [IU] via INTRAVENOUS
  Administered 2016-08-27: 7 [IU] via INTRAVENOUS
  Filled 2016-08-27: qty 10

## 2016-08-27 NOTE — Progress Notes (Signed)
PROGRESS NOTE  Roland Lipke  ZOX:[id number] DOB: 06/22/1985 DOA: 08/26/2016 PCP: [name], MD  Brief Narrative:    [name] [name] is a 31 y.o. male with a history of type 1 diabetes with 3 admissions over the past 6 months for DKA.  He has not re-enrolled in an insulin program he has been a part of for the last 3 years and ran out of his insulin.  He was admitted with DKA with blood glucose greater than 600, bicarb less than 7 and incalculable anion gap.  He was started on insulin gtt and his gap has closed and his bicarb is gradually improving.  He has severe electrolyte abnormalities.  Plan to transition to subcutaneous insulin tonight at dinnertime and possible discharge tomorrow morning.    Assessment & Plan:   Principal Problem:   Diabetic ketoacidosis without coma associated with type 1 diabetes mellitus (HCC) Active Problems:   High blood pressure   Hyperkalemia   Leukocytosis   AKI (acute kidney injury) (H[facility])  DKA without coma due to medication noncompliance -  Resume diet -  Bicarb continuing to trend up -  Repeat BMP q 4h but would anticipate resuming home insulin regimen this evening -  Continue dextrose fluids until insulin gtt turned off -  Prn antiemetics  Hypokalemia, hypomagnesemia, and hypophosphatemia  -  IV and oral potassium  -  IV magnesium 4g -  IV phosphate   Elevated blood pressures due to stress, blood pressures normal today without intervention  Leukocytosis due to stress reaction and dehydration -  Repeat CBC in AM  DVT prophylaxis:  lovenox Code Status:  full Family Communication:  Patient alone Disposition Plan:  Likely home tomorrow after home medications have been resumed.  Case management consult to assist with insulin availability   Consultants:   none  Procedures:  Insulin gtt  Antimicrobials:  Anti-infectives    None       Subjective: Feeling better.  Better energy, nausea and vomiting have improved.  Denies any new  symptoms of underlying infection such as cough, SOB, diarrhea.  Would like to eat breakfast.   Objective: Vitals:   08/27/16 0332 08/27/16 0700 08/27/16 0704 08/27/16 1243  BP: 114/82 118/78 118/78   Pulse: 96 92 95   Resp: Temp: 98.5 F (36.9 C) 98 F (36.7 C) 98 F (36.7 C) 98.8 F (37.1 C)  TempSrc: Oral Oral Oral Oral  SpO2: 100% 100% 100%   Weight:      Height:        Intake/Output Summary (Last 24 hours) at 08/27/16 1331 Last data filed at 08/27/16 1000  Gross per 24 hour  Intake          4198.94 ml  Output             2450 ml  Net          1748.94 ml   Filed Weights   08/26/16 1444  Weight: 46.2 kg (101 lb 13.6 oz)    Examination:  General exam:  Adult male, thin.  No acute distress.  HEENT:  NCAT, MMM Respiratory system: Clear to auscultation bilaterally Cardiovascular system: Regular rate and rhythm, normal S1/S2. No murmurs, rubs, gallops or clicks.  Warm extremities Gastrointestinal system: Normal active bowel sounds, soft, nondistended, nontender. MSK:  Normal tone and bulk, no lower extremity edema Neuro:  Grossly intact    Data Reviewed: I have personally reviewed following labs and imaging studies  CBC:  Recent Labs Lab 08/26/16 0922  WBC 13.2*  HGB 14.3  HCT 40.5  MCV 86.4  PLT 313   Basic Metabolic Panel:  Recent Labs Lab 08/26/16 1843 08/26/16 2030 08/27/16 0030 08/27/16 0502 08/27/16 0953  NA 141 140 138 135 136  K 3.2* 3.0* 3.0* 2.8* 2.7*  CL 116* 117* 114* 112* 112*  CO2 9* 13* 15* 15* 18*  GLUCOSE 202* 166* 116* 189* 147*  BUN CREATININE 1.11 0.94 0.71 0.75 0.55*  CALCIUM 7.6* 7.7* 7.4* 7.3* 7.4*  MG  --   --   --   --  1.6*  PHOS  --   --   --   --  <1.0*   GFR: Estimated Creatinine Clearance: 86.6 mL/min (A) (by C-G formula based on SCr of 0.55 mg/dL (L)). Liver Function Tests: No results for input(s): AST, ALT, ALKPHOS, BILITOT, PROT, ALBUMIN in the last 168 hours. No results for  input(s): LIPASE, AMYLASE in the last 168 hours. No results for input(s): AMMONIA in the last 168 hours. Coagulation Profile: No results for input(s): INR, PROTIME in the last 168 hours. Cardiac Enzymes: No results for input(s): CKTOTAL, CKMB, CKMBINDEX, TROPONINI in the last 168 hours. BNP (last 3 results) No results for input(s): PROBNP in the last 8760 hours. HbA1C: No results for input(s): HGBA1C in the last 72 hours. CBG:  Recent Labs Lab 08/27/16 0821 08/27/16 0941 08/27/16 1046 08/27/16 1155 08/27/16 1258  GLUCAP 133* 163* 147* 161* 211*   Lipid Profile: No results for input(s): CHOL, HDL, LDLCALC, TRIG, CHOLHDL, LDLDIRECT in the last 72 hours. Thyroid Function Tests: No results for input(s): TSH, T4TOTAL, FREET4, T3FREE, THYROIDAB in the last 72 hours. Anemia Panel: No results for input(s): VITAMINB12, FOLATE, FERRITIN, TIBC, IRON, RETICCTPCT in the last 72 hours. Urine analysis:    Component Value Date/Time   COLORURINE STRAW (A) 08/26/2016 0952   APPEARANCEUR CLEAR 08/26/2016 0952   LABSPEC 1.024 08/26/2016 0952   PHURINE 5.0 08/26/2016 0952   GLUCOSEU >=500 (A) 08/26/2016 0952   HGBUR SMALL (A) 08/26/2016 0952   BILIRUBINUR NEGATIVE 08/26/2016 0952   BILIRUBINUR Negative 02/07/2016 1528   KETONESUR 80 (A) 08/26/2016 0952   PROTEINUR NEGATIVE 08/26/2016 0952   UROBILINOGEN 0.2 02/07/2016 1528   UROBILINOGEN 0.2 03/22/2015 0913   NITRITE NEGATIVE 08/26/2016 0952   LEUKOCYTESUR NEGATIVE 08/26/2016 0952   Sepsis Labs: (procalcitonin:4,lacticidven:4)  ) Recent Results (from the past 240 hour(s))  MRSA PCR Screening     Status: None   Collection Time: 08/26/16  6:37 PM  Result Value Ref Range Status   MRSA by PCR NEGATIVE NEGATIVE Final    Comment:        The GeneXpert MRSA Assay (FDA approved for NASAL specimens only), is one component of a comprehensive MRSA colonization surveillance program. It is not intended to diagnose MRSA infection  nor to guide or monitor treatment for MRSA infections.       Radiology Studies: No results found.   Scheduled Meds: . enoxaparin (LOVENOX) injection  40 mg Subcutaneous Q24H  . insulin regular  0-10 Units Intravenous TID WC  . living well with diabetes book   Does not apply Once  . magnesium sulfate 1 - 4 g bolus IVPB  4 g Intravenous Once  . potassium phosphate IVPB (mmol)  30 mmol Intravenous Once   Continuous Infusions: . dextrose 5 % and 0.45% NaCl 125 mL/hr at 08/27/16 0926  . insulin (NOVOLIN-R) infusion 4.5 Units/hr (08/27/16  1259)     LOS: 1 day    Time spent: 30 min    Renae Fickle, MD [facility] Pager [phone]  If 7PM-7AM, please contact night-coverage [email or web address] Password [email or web address] 08/27/2016, 1:31 PM

## 2016-08-28 ENCOUNTER — Encounter (HOSPITAL_COMMUNITY): Payer: Self-pay | Admitting: *Deleted

## 2016-08-28 LAB — RENAL FUNCTION PANEL
Albumin: 3 g/dL — ABNORMAL LOW (ref 3.5–5.0)
Anion gap: 8 (ref 5–15)
CHLORIDE: 106 mmol/L (ref 101–111)
CO2: 23 mmol/L (ref 22–32)
CREATININE: 0.58 mg/dL — AB (ref 0.61–1.24)
Calcium: 7.9 mg/dL — ABNORMAL LOW (ref 8.9–10.3)
GFR calc Af Amer: >60 mL/min (ref >60)
GFR calc non Af Amer: >60 mL/min (ref >60)
Glucose, Bld: 218 mg/dL — ABNORMAL HIGH (ref 65–99)
Phosphorus: 2.2 mg/dL — ABNORMAL LOW (ref 2.5–4.6)
Potassium: 3.1 mmol/L — ABNORMAL LOW (ref 3.5–5.1)
Sodium: 137 mmol/L (ref 135–145)

## 2016-08-28 LAB — CBC
HEMATOCRIT: 32.4 % — AB (ref 39.0–52.0)
HEMOGLOBIN: 11.5 g/dL — AB (ref 13.0–17.0)
MCH: 30 pg (ref 26.0–34.0)
MCHC: 35.5 g/dL (ref 30.0–36.0)
MCV: 84.6 fL (ref 78.0–100.0)
Platelets: 192 10*3/uL (ref 150–400)
RBC: 3.83 MIL/uL — AB (ref 4.22–5.81)
RDW: 12.4 % (ref 11.5–15.5)
WBC: 4.7 10*3/uL (ref 4.0–10.5)

## 2016-08-28 LAB — HEMOGLOBIN A1C
HEMOGLOBIN A1C: 15 % — AB (ref 4.8–5.6)
MEAN PLASMA GLUCOSE: 384 mg/dL

## 2016-08-28 LAB — GLUCOSE, CAPILLARY: Glucose-Capillary: 198 mg/dL — ABNORMAL HIGH (ref 65–99)

## 2016-08-28 LAB — MAGNESIUM: Magnesium: 2.2 mg/dL (ref 1.7–2.4)

## 2016-08-28 MED ORDER — POTASSIUM CHLORIDE CRYS ER 20 MEQ PO TBCR
40.0000 meq | EXTENDED_RELEASE_TABLET | Freq: Once | ORAL | Status: AC
  Administered 2016-08-28: 40 meq via ORAL
  Filled 2016-08-28: qty 2

## 2016-08-28 MED ORDER — INSULIN GLARGINE 100 UNIT/ML SOLOSTAR PEN: 40.0000 [IU] | PEN_INJECTOR | Freq: Every day | SUBCUTANEOUS | 0 refills | Status: DC

## 2016-08-28 MED FILL — !LANTUS SOLOSTAR 100UNITS/M: 100 | 14 days supply | Qty: 6 | Fill #0

## 2016-08-28 MED FILL — !NOVOLOG 100UNITS/ML VIAL: 100/ML | 28 days supply | Qty: 10 | Fill #0

## 2016-08-28 NOTE — Discharge Summary (Addendum)
Physician Discharge Summary  [name] [name] HBZ:[id number] DOB: November 22, 1985 DOA: 08/26/2016  PCP: Minerva Ends, MD  Admit date: 08/26/2016 Discharge date: 08/28/2016  Admitted From: home  Disposition:  home  Recommendations for Outpatient Follow-up:  1. Follow up with PCP tomorrow at already scheduled appointment 2. Planning to go to clinic today to get one lantus flexpen to use for his insulin tonight 3. Please obtain BMP/CBC in one week  Home Health:  None  Equipment/Devices:  None   Discharge Condition:  Stable, improved CODE STATUS:  Full code  Diet recommendation:  Diabetic diet   Brief/Interim Summary:  The patient is a 31 year old male with history of type 1 diabetes mellitus and 3 admissions over the last 6 months for diabetic ketoacidosis. He has not reenrolled in insulin program he had been a part of the last 3 years and he therefore ran out of his insulin. He was admitted to the hospital with nausea and vomiting and DKA with a blood glucose of greater than 600, bicarbonate less than 7, and an incalculable and 9. He was started on an insulin drip and was able to be transitioned off in approximately 24 hours. He had severe electrolyte abnormalities including hypokalemia, hypomagnesemia, and hypophosphatemia.  He was given IV and oral supplementation of these electrolytes.  He was given a prescription for Lantus and states that he will be able to pick up a pen from his primary care doctor's office today to use for his evening insulin tonight. He has an appointment with his primary care doctor tomorrow at which time he feels he will be able to complete the paperwork to continue getting his monthly prescriptions of insulin. He was able to eat and drink prior to discharge without difficulty. He felt back to his baseline and was ready to go home.  Discharge Diagnoses:  Principal Problem:   Diabetic ketoacidosis without coma associated with type 1 diabetes mellitus ([place]) Active  Problems:   Hypokalemia   Hypomagnesemia   High blood pressure   Hyperkalemia   Leukocytosis   AKI (acute kidney injury) ([place])   Hypophosphatemia   DKA without coma due to medication noncompliance, A1c 15 on 08/26/2016 -   tolerating diabetic diet -   Bicarb corrected on 4/9 -   continue Lantus 40 units daily at bedtime and aspart 15 units with meals  Hypokalemia, hypomagnesemia, and hypophosphatemia  -   given IV and oral potassium, IV magnesium 4g, and IV phosphate   Elevated blood pressures due to stress, blood pressures normalized without intervention  Leukocytosis due to stress reaction and dehydration, resolved with IV fluids and treatment of DKA. He did not require antibiotics   Discharge Instructions  Discharge Instructions    Call MD for:  difficulty breathing, headache or visual disturbances    Complete by:  As directed    Call MD for:  extreme fatigue    Complete by:  As directed    Call MD for:  hives    Complete by:  As directed    Call MD for:  persistant dizziness or light-headedness    Complete by:  As directed    Call MD for:  persistant nausea and vomiting    Complete by:  As directed    Call MD for:  severe uncontrolled pain    Complete by:  As directed    Call MD for:  temperature >100.4    Complete by:  As directed    Diet Carb Modified    Complete  by:  As directed    Increase activity slowly    Complete by:  As directed        Medication List    TAKE these medications   glucose blood test strip Commonly known as:  TRUE METRIX BLOOD GLUCOSE TEST 1 each by Other route 3 (three) times daily.   Insulin Glargine 100 UNIT/ML Solostar Pen Commonly known as:  LANTUS Inject 40 Units into the skin daily at 10 pm.   insulin lispro 100 UNIT/ML KiwkPen Commonly known as:  HUMALOG Inject 0.15 mLs (15 Units total) into the skin 3 (three) times daily. For PASS   Insulin Syringe-Needle U-100 31G X 5/16" 0.5 ML Misc Commonly known as:  B-D INS  SYRINGE 0.5CC/31GX5/16 1 each by Does not apply route 2 (two) times daily.   TRUE METRIX METER w/Device Kit 1 each by Does not apply route as needed.   TRUEPLUS LANCETS 28G Misc 1 each by Does not apply route 3 (three) times daily.      Follow-up Information    Minerva Ends, MD. Schedule an appointment as soon as possible for a visit in 1 day(s).   Specialty:  Family Medicine Contact information: [address] [place] [address] [phone]          No Known Allergies  Consultations: None    Procedures/Studies: No results found.  Insulin drip   Subjective: Feeling well. He has been able to eat and drink without difficulty. He denies nausea and vomiting. He feels ready to go home.  Discharge Exam: Vitals:   08/28/16 0340 08/28/16 0750  BP: (!) 128/95 (!) 136/95  Pulse: 97 99  Resp: 14 20  Temp: 98.4 F (36.9 C) 98.4 F (36.9 C)   Vitals:   08/27/16 1932 08/28/16 0104 08/28/16 0340 08/28/16 0750  BP: (!) 122/93 127/88 (!) 128/95 (!) 136/95  Pulse: (!) 101 88 97 99  Resp: '13 17 14 20  '$ Temp: 98.3 F (36.8 C) 98.4 F (36.9 C) 98.4 F (36.9 C) 98.4 F (36.9 C)  TempSrc: Oral Oral Oral Oral  SpO2: 100% 100% 100% 100%  Weight:      Height:        General exam:  Adult male, thin.  Mild temporal wasting  No acute distress.  HEENT:  NCAT, MMM Respiratory system: Clear to auscultation bilaterally Cardiovascular system: Regular rate and rhythm, normal S1/S2. No murmurs, rubs, gallops or clicks.  Warm extremities Gastrointestinal system: Normal active bowel sounds, soft, nondistended, nontender. MSK:  Normal tone and bulk, no lower extremity edema Neuro:  Grossly intact   The results of significant diagnostics from this hospitalization (including imaging, microbiology, ancillary and laboratory) are listed below for reference.     Microbiology: Recent Results (from the past 240 hour(s))  MRSA PCR Screening     Status: None   Collection  Time: 08/26/16  6:37 PM  Result Value Ref Range Status   MRSA by PCR NEGATIVE NEGATIVE Final    Comment:        The GeneXpert MRSA Assay (FDA approved for NASAL specimens only), is one component of a comprehensive MRSA colonization surveillance program. It is not intended to diagnose MRSA infection nor to guide or monitor treatment for MRSA infections.      Labs: BNP (last 3 results) No results for input(s): BNP in the last 8760 hours. Basic Metabolic Panel:  Recent Labs Lab 08/27/16 0030 08/27/16 0502 08/27/16 0953 08/27/16 1406 08/28/16 0332  NA 138 135 136  132* 137  K 3.0* 2.8* 2.7* 3.2* 3.1*  CL 114* 112* 112* 107 106  CO2 15* 15* 18* 17* 23  GLUCOSE 116* 189* 147* 271* 218*  BUN '11 9 8 '$ 5* <5*  CREATININE 0.71 0.75 0.55* 0.67 0.58*  CALCIUM 7.4* 7.3* 7.4* 7.2* 7.9*  MG  --   --  1.6*  --  2.2  PHOS  --   --  <1.0* 1.3* 2.2*   Liver Function Tests:  Recent Labs Lab 08/27/16 1406 08/28/16 0332  ALBUMIN 2.6* 3.0*   No results for input(s): LIPASE, AMYLASE in the last 168 hours. No results for input(s): AMMONIA in the last 168 hours. CBC:  Recent Labs Lab 08/26/16 0922 08/28/16 0332  WBC 13.2* 4.7  HGB 14.3 11.5*  HCT 40.5 32.4*  MCV 86.4 84.6  PLT 313 192   Cardiac Enzymes: No results for input(s): CKTOTAL, CKMB, CKMBINDEX, TROPONINI in the last 168 hours. BNP: Invalid input(s): POCBNP CBG:  Recent Labs Lab 08/27/16 1841 08/27/16 1944 08/27/16 2052 08/27/16 2142 08/28/16 0817  GLUCAP 161* 178* 114* 109* 198*   D-Dimer No results for input(s): DDIMER in the last 72 hours. Hgb A1c  Recent Labs  08/26/16 1520  HGBA1C 15.0*   Lipid Profile No results for input(s): CHOL, HDL, LDLCALC, TRIG, CHOLHDL, LDLDIRECT in the last 72 hours. Thyroid function studies No results for input(s): TSH, T4TOTAL, T3FREE, THYROIDAB in the last 72 hours.  Invalid input(s): FREET3 Anemia work up No results for input(s): VITAMINB12, FOLATE, FERRITIN,  TIBC, IRON, RETICCTPCT in the last 72 hours. Urinalysis    Component Value Date/Time   COLORURINE STRAW (A) 08/26/2016 0952   APPEARANCEUR CLEAR 08/26/2016 0952   LABSPEC 1.024 08/26/2016 0952   PHURINE 5.0 08/26/2016 0952   GLUCOSEU >=500 (A) 08/26/2016 0952   HGBUR SMALL (A) 08/26/2016 0952   BILIRUBINUR NEGATIVE 08/26/2016 0952   BILIRUBINUR Negative 02/07/2016 1528   KETONESUR 80 (A) 08/26/2016 0952   PROTEINUR NEGATIVE 08/26/2016 0952   UROBILINOGEN 0.2 02/07/2016 1528   UROBILINOGEN 0.2 03/22/2015 0913   NITRITE NEGATIVE 08/26/2016 0952   LEUKOCYTESUR NEGATIVE 08/26/2016 0952   Sepsis Labs Invalid input(s): PROCALCITONIN,  WBC,  LACTICIDVEN   Time coordinating discharge: Over 30 minutes  SIGNED:   Janece Canterbury, MD  [facility] 08/28/2016, 12:57 PM Pager   If 7PM-7AM, please contact night-coverage [email or web address] Password [email or web address]

## 2016-08-28 NOTE — Progress Notes (Signed)
Discussed discharge instructions and medications with patient. Follow up appointments set. Verbalized understanding with all questions answered. VSS. Pt discharged home. Pt wanted to walk to clinic and take bus home. Walked with patient out of unit.   [facility]

## 2016-08-28 NOTE — Progress Notes (Signed)
   08/28/2016   To Whom It May Concern,  Alanzo Lamb was admitted to [facility]. [facility] from 08/26/2016 to 08/28/2016.  He may return to work without restrictions on 08/30/2016.    Sincerely,    Renae Fickle, MD [facility] [address] [place]  [address]  Ph:    [phone] Fax:  [phone]

## 2016-08-28 NOTE — Care Management Note (Signed)
Case Management Note  Patient Details  Name: [name] [name] MRN: [id number] Date of Birth: 1985/06/25  Subjective/Objective:   Patient presents with DKA, he is already established at the [facility] clinic and they are filling his prescriptions for lantus, he will go by there to pick up his medications.  pta indep.  He is for dc today, he states he has transportation.                 Action/Plan:   Expected Discharge Date:  08/28/16               Expected Discharge Plan:  Home/Self Care  In-House Referral:     Discharge planning Services  CM Consult, Follow-up appt scheduled, Indigent Health Clinic, Medication Assistance  Post Acute Care Choice:    Choice offered to:     DME Arranged:    DME Agency:     HH Arranged:    HH Agency:     Status of Service:  Completed, signed off  If discussed at [employer], dates discussed:    Additional Comments:  Leone Haven, RN 08/28/2016, 10:55 AM

## 2016-08-30 ENCOUNTER — Other Ambulatory Visit: Payer: Self-pay

## 2016-08-30 DIAGNOSIS — E1010 Type 1 diabetes mellitus with ketoacidosis without coma: Secondary | ICD-10-CM

## 2016-08-30 MED ORDER — INSULIN GLARGINE 100 UNIT/ML SOLOSTAR PEN: 40.0000 [IU] | PEN_INJECTOR | Freq: Every day | SUBCUTANEOUS | 3 refills | Status: DC

## 2016-08-30 MED ORDER — INSULIN LISPRO 100 UNIT/ML (KWIKPEN): 15.0000 [IU] | PEN_INJECTOR | Freq: Three times a day (TID) | SUBCUTANEOUS | 3 refills | Status: DC

## 2016-09-07 ENCOUNTER — Ambulatory Visit: Payer: Self-pay | Admitting: Family Medicine

## 2016-09-15 ENCOUNTER — Ambulatory Visit: Payer: Self-pay | Admitting: Family Medicine

## 2016-10-04 ENCOUNTER — Encounter: Payer: Self-pay | Admitting: Family Medicine

## 2016-10-04 MED FILL — !LANTUS SOLOSTAR 100UNITS/M: 100 | 14 days supply | Qty: 6 | Fill #1

## 2016-10-04 MED FILL — !NOVOLOG 100UNITS/ML VIAL: 100/ML | 28 days supply | Qty: 10 | Fill #1

## 2016-10-29 ENCOUNTER — Encounter (HOSPITAL_COMMUNITY): Payer: Self-pay | Admitting: Emergency Medicine

## 2016-10-29 ENCOUNTER — Observation Stay (HOSPITAL_COMMUNITY)
Admission: EM | Admit: 2016-10-29 | Discharge: 2016-10-30 | Disposition: A | Discharge status: Home / Self Care | Payer: Self-pay | Attending: Internal Medicine | Admitting: Internal Medicine

## 2016-10-29 DIAGNOSIS — N179 Acute kidney failure, unspecified: Secondary | ICD-10-CM | POA: Insufficient documentation

## 2016-10-29 DIAGNOSIS — F1210 Cannabis abuse, uncomplicated: Secondary | ICD-10-CM | POA: Insufficient documentation

## 2016-10-29 DIAGNOSIS — Z794 Long term (current) use of insulin: Secondary | ICD-10-CM | POA: Insufficient documentation

## 2016-10-29 DIAGNOSIS — Z87891 Personal history of nicotine dependence: Secondary | ICD-10-CM | POA: Insufficient documentation

## 2016-10-29 DIAGNOSIS — E876 Hypokalemia: Secondary | ICD-10-CM | POA: Insufficient documentation

## 2016-10-29 DIAGNOSIS — E1010 Type 1 diabetes mellitus with ketoacidosis without coma: Principal | ICD-10-CM | POA: Insufficient documentation

## 2016-10-29 DIAGNOSIS — IMO0002 Reserved for concepts with insufficient information to code with codable children: Secondary | ICD-10-CM | POA: Diagnosis present

## 2016-10-29 DIAGNOSIS — F1910 Other psychoactive substance abuse, uncomplicated: Secondary | ICD-10-CM | POA: Diagnosis present

## 2016-10-29 DIAGNOSIS — E1110 Type 2 diabetes mellitus with ketoacidosis without coma: Secondary | ICD-10-CM | POA: Diagnosis present

## 2016-10-29 DIAGNOSIS — E1065 Type 1 diabetes mellitus with hyperglycemia: Secondary | ICD-10-CM | POA: Diagnosis present

## 2016-10-29 LAB — GLUCOSE, CAPILLARY
GLUCOSE-CAPILLARY: 232 mg/dL — AB (ref 65–99)
Glucose-Capillary: 188 mg/dL — ABNORMAL HIGH (ref 65–99)
Glucose-Capillary: 201 mg/dL — ABNORMAL HIGH (ref 65–99)

## 2016-10-29 LAB — BASIC METABOLIC PANEL
ANION GAP: 13 (ref 5–15)
Anion gap: 22 — ABNORMAL HIGH (ref 5–15)
BUN: 17 mg/dL (ref 6–20)
BUN: 15 mg/dL (ref 6–20)
CALCIUM: 9 mg/dL (ref 8.9–10.3)
CHLORIDE: 99 mmol/L — AB (ref 101–111)
CO2: 16 mmol/L — ABNORMAL LOW (ref 22–32)
CO2: 15 mmol/L — ABNORMAL LOW (ref 22–32)
CREATININE: 0.77 mg/dL (ref 0.61–1.24)
Calcium: 10.2 mg/dL (ref 8.9–10.3)
Chloride: 111 mmol/L (ref 101–111)
Creatinine, Ser: 0.95 mg/dL (ref 0.61–1.24)
GFR calc Af Amer: >60 mL/min (ref >60)
GFR calc non Af Amer: >60 mL/min (ref >60)
GFR calc non Af Amer: >60 mL/min (ref >60)
GLUCOSE: 339 mg/dL — AB (ref 65–99)
Glucose, Bld: 242 mg/dL — ABNORMAL HIGH (ref 65–99)
POTASSIUM: 4.3 mmol/L (ref 3.5–5.1)
Potassium: 4 mmol/L (ref 3.5–5.1)
SODIUM: 139 mmol/L (ref 135–145)
Sodium: 137 mmol/L (ref 135–145)

## 2016-10-29 LAB — CBC
HEMATOCRIT: 40.2 % (ref 39.0–52.0)
HEMOGLOBIN: 14.4 g/dL (ref 13.0–17.0)
MCH: 31 pg (ref 26.0–34.0)
MCHC: 35.8 g/dL (ref 30.0–36.0)
MCV: 86.5 fL (ref 78.0–100.0)
Platelets: 316 10*3/uL (ref 150–400)
RBC: 4.65 MIL/uL (ref 4.22–5.81)
RDW: 12.4 % (ref 11.5–15.5)
WBC: 11 10*3/uL — AB (ref 4.0–10.5)

## 2016-10-29 LAB — CBG MONITORING, ED
GLUCOSE-CAPILLARY: 382 mg/dL — AB (ref 65–99)
Glucose-Capillary: 287 mg/dL — ABNORMAL HIGH (ref 65–99)
Glucose-Capillary: 353 mg/dL — ABNORMAL HIGH (ref 65–99)
Glucose-Capillary: 237 mg/dL — ABNORMAL HIGH (ref 65–99)
Glucose-Capillary: 256 mg/dL — ABNORMAL HIGH (ref 65–99)

## 2016-10-29 LAB — MRSA PCR SCREENING: MRSA by PCR: NEGATIVE

## 2016-10-29 LAB — URINALYSIS, ROUTINE W REFLEX MICROSCOPIC
Bilirubin Urine: NEGATIVE
Glucose, UA: >=500 mg/dL — AB
Hgb urine dipstick: NEGATIVE
KETONES UR: 80 mg/dL — AB
LEUKOCYTES UA: NEGATIVE
Nitrite: NEGATIVE
PH: 5 (ref 5.0–8.0)
PROTEIN: NEGATIVE mg/dL
SQUAMOUS EPITHELIAL / LPF: NONE SEEN
Specific Gravity, Urine: 1.024 (ref 1.005–1.030)

## 2016-10-29 LAB — RAPID URINE DRUG SCREEN, HOSP PERFORMED
Amphetamines: NOT DETECTED
BARBITURATES: NOT DETECTED
Benzodiazepines: NOT DETECTED
COCAINE: NOT DETECTED
OPIATES: NOT DETECTED
TETRAHYDROCANNABINOL: POSITIVE — AB

## 2016-10-29 LAB — MAGNESIUM: MAGNESIUM: 1.8 mg/dL (ref 1.7–2.4)

## 2016-10-29 LAB — PHOSPHORUS: PHOSPHORUS: 4.6 mg/dL (ref 2.5–4.6)

## 2016-10-29 MED ORDER — ONDANSETRON HCL 4 MG/2ML IJ SOLN
4.0000 mg | Freq: Once | INTRAMUSCULAR | Status: AC
  Administered 2016-10-29: 4 mg via INTRAVENOUS
  Filled 2016-10-29: qty 2

## 2016-10-29 MED ORDER — SODIUM CHLORIDE 0.9 % IV BOLUS (SEPSIS)
1000.0000 mL | Freq: Once | INTRAVENOUS | Status: AC
  Administered 2016-10-29: 1000 mL via INTRAVENOUS

## 2016-10-29 MED ORDER — SODIUM CHLORIDE 0.9 % IV SOLN
INTRAVENOUS | Status: DC
Start: 2016-10-29 — End: 2016-10-30
  Administered 2016-10-29: 23:00:00 via INTRAVENOUS

## 2016-10-29 MED ORDER — DEXTROSE-NACL 5-0.45 % IV SOLN
INTRAVENOUS | Status: DC
  Administered 2016-10-29 – 2016-10-30 (×2): via INTRAVENOUS

## 2016-10-29 MED ORDER — POTASSIUM CHLORIDE 10 MEQ/100ML IV SOLN
10.0000 meq | INTRAVENOUS | Status: AC
  Administered 2016-10-29 (×2): 10 meq via INTRAVENOUS
  Filled 2016-10-29 (×2): qty 100

## 2016-10-29 MED ORDER — DEXTROSE-NACL 5-0.45 % IV SOLN
INTRAVENOUS | Status: DC
  Administered 2016-10-29: 20:00:00 via INTRAVENOUS

## 2016-10-29 MED ORDER — ENOXAPARIN SODIUM 40 MG/0.4ML ~~LOC~~ SOLN: 40.0000 mg | Freq: Every day | SUBCUTANEOUS | Status: DC

## 2016-10-29 MED ORDER — SODIUM CHLORIDE 0.9 % IV SOLN
INTRAVENOUS | Status: DC
  Administered 2016-10-29: 23:00:00 via INTRAVENOUS
  Filled 2016-10-29: qty 1

## 2016-10-29 MED ORDER — SODIUM CHLORIDE 0.9 % IV SOLN
INTRAVENOUS | Status: AC
  Administered 2016-10-29: 23:00:00 via INTRAVENOUS

## 2016-10-29 MED ORDER — SODIUM CHLORIDE 0.9 % IV SOLN: INTRAVENOUS | Status: DC

## 2016-10-29 MED ORDER — SODIUM CHLORIDE 0.9 % IV SOLN
INTRAVENOUS | Status: DC
  Administered 2016-10-29: 3.2 [IU]/h via INTRAVENOUS
  Filled 2016-10-29: qty 1

## 2016-10-29 NOTE — ED Provider Notes (Signed)
Medical screening examination/treatment/procedure(s) were conducted as a shared visit with non-physician practitioner(s) and myself.  I personally evaluated the patient during the encounter. Briefly, the patient is a 31 y.o. male presents with hyperglycemia. Workup consistent with DKA. Started on IV fluids and insulin drip. Admitted for further management..    EKG Interpretation None           Cardama, Amadeo GarnetPedro Eduardo, MD 10/29/16 1818

## 2016-10-29 NOTE — ED Triage Notes (Signed)
Pt from home with c/o generalized body aches and emesis secondary to hyperglycemia that began today. Pt states he has been compliant with home meds

## 2016-10-29 NOTE — ED Provider Notes (Signed)
[facility] DEPT Provider Note   CSN: [id number] Arrival date & time: 10/29/16  1528     History   Chief Complaint Chief Complaint  Patient presents with  . Hyperglycemia    HPI [name] [name] is a 31 y.o. male.  HPI   31 year old male with hx of poorly controlled IDDM, recurrent DKA, chronic marijuana use, presenting c/o generalized weakness.  Pt sts he woke up this morning feeling weak and tired.  Sts he feels nauseous, and have vomited 5-7 times of NBNB content.  He attributed his sxs to elevated CBG.  sts it has been running high in the 200s when checked despite being compliant with his medications.  He denies fever, chills, headache, URI sxs, cp, sob, productive cough, abd pain, back pain, dysuria, bowel/bladder changes.  Denies recent marijuana use or recent sick contact.    Past Medical History:  Diagnosis Date  . Diabetes mellitus without complication ([place]) Dx 5465  . Renal insufficiency 2011   pt was on dialysis for a short time approx 2011, pt is no longer on dialysis    Patient Active Problem List   Diagnosis Date Noted  . Hypophosphatemia 08/27/2016  . AKI (acute kidney injury) ([place]) 07/11/2016  . Hyperkalemia 03/09/2016  . Renal insufficiency 03/09/2016  . Leukocytosis 03/09/2016  . Diabetic ketoacidosis without coma associated with type 1 diabetes mellitus ([place])   . Non-intractable vomiting with nausea   . Chest pain 12/18/2015  . DKA, type 1, not at goal [facility]) 12/02/2015  . Marijuana use 07/22/2015  . Proteinuria with type 1 diabetes mellitus ([place]) 07/22/2015  . High blood pressure 07/22/2015  . Uncontrolled type 1 diabetes mellitus with ketoacidosis without coma ([place])   . Hypocalcemia   . Acute renal failure ([place])   . Hypokalemia   . Hypomagnesemia   . Abnormal EKG 06/15/2015  . Sinus tachycardia   . Tachypnea   . Tinea pedis 12/17/2014  . Adjustment disorder with mixed anxiety and depressed mood 09/03/2014  . Protein-calorie malnutrition, severe  ([place]) 07/06/2014  . DM (diabetes mellitus), type 1, uncontrolled ([place]) 05/01/2014  . HLD (hyperlipidemia) 04/26/2014  . Substance abuse   . Dehydration 07/07/2013  . Fatigue 03/10/2012    History reviewed. No pertinent surgical history.     Home Medications    Prior to Admission medications   Medication Sig Start Date End Date Taking? Authorizing Provider  Blood Glucose Monitoring Suppl (TRUE METRIX METER) w/Device KIT 1 each by Does not apply route as needed. Patient not taking: Reported on 08/26/2016 07/12/16   Florencia Reasons, MD  glucose blood (TRUE METRIX BLOOD GLUCOSE TEST) test strip 1 each by Other route 3 (three) times daily. Patient not taking: Reported on 08/26/2016 07/12/16   Florencia Reasons, MD  Insulin Glargine (LANTUS) 100 UNIT/ML Solostar Pen Inject 40 Units into the skin daily at 10 pm. 08/30/16   Funches, Adriana Mccallum, MD  insulin lispro (HUMALOG) 100 UNIT/ML KiwkPen Inject 0.15 mLs (15 Units total) into the skin 3 (three) times daily. For PASS 08/30/16   [name], MD  Insulin Syringe-Needle U-100 (B-D INS SYRINGE 0.5CC/31GX5/16) 31G X 5/16" 0.5 ML MISC 1 each by Does not apply route 2 (two) times daily. Patient not taking: Reported on 07/12/2016 03/27/16   [name], MD  TRUEPLUS LANCETS 28G MISC 1 each by Does not apply route 3 (three) times daily. Patient not taking: Reported on 07/12/2016 03/11/16   Florencia Reasons, MD    Family History Family History  Problem  Relation Age of Onset  . Diabetes Father   . Diabetes Maternal Grandmother   . Heart disease Neg Hx   . Hypertension Neg Hx     Social History Social History  Substance Use Topics  . Smoking status: Former Smoker    Quit date: 09/19/2013  . Smokeless tobacco: Never Used  . Alcohol use No     Allergies   Patient has no known allergies.   Review of Systems Review of Systems  All other systems reviewed and are negative.    Physical Exam Updated Vital Signs BP 116/81 (BP Location: Left Arm)   Pulse 98    Temp 98.2 F (36.8 C) (Oral)   Resp 18   SpO2 100%   Physical Exam  Constitutional: He is oriented to person, place, and time. He appears well-developed and well-nourished. No distress.  Thin appearing male, non toxic in appearance.   HENT:  Head: Atraumatic.  Mouth/Throat: Oropharynx is clear and moist.  Eyes: Conjunctivae are normal.  Neck: Normal range of motion. Neck supple.  Cardiovascular: Intact distal pulses.   Mild tachycardia with M/R/G  Pulmonary/Chest: Effort normal and breath sounds normal.  Abdominal: Soft. Bowel sounds are normal. He exhibits no distension. There is no tenderness.  Musculoskeletal: He exhibits no edema.  Moving all 4 extremities with equal strength  Neurological: He is alert and oriented to person, place, and time.  Skin: No rash noted.  Psychiatric: He has a normal mood and affect.  Nursing note and vitals reviewed.    ED Treatments / Results  Labs (all labs ordered are listed, but only abnormal results are displayed) Labs Reviewed  BASIC METABOLIC PANEL - Abnormal; Notable for the following:       Result Value   Chloride 99 (*)    CO2 16 (*)    Glucose, Bld 339 (*)    Anion gap 22 (*)    All other components within normal limits  CBC - Abnormal; Notable for the following:    WBC 11.0 (*)    All other components within normal limits  URINALYSIS, ROUTINE W REFLEX MICROSCOPIC - Abnormal; Notable for the following:    Glucose, UA >=500 (*)    Ketones, ur 80 (*)    Bacteria, UA RARE (*)    All other components within normal limits  CBG MONITORING, ED - Abnormal; Notable for the following:    Glucose-Capillary 287 (*)    All other components within normal limits  CBG MONITORING, ED - Abnormal; Notable for the following:    Glucose-Capillary 353 (*)    All other components within normal limits  CBG MONITORING, ED - Abnormal; Notable for the following:    Glucose-Capillary 382 (*)    All other components within normal limits  MAGNESIUM    PHOSPHORUS    EKG  EKG Interpretation None       Radiology No results found.  Procedures Procedures (including critical care time)  Medications Ordered in ED Medications  dextrose 5 %-0.45 % sodium chloride infusion (not administered)  insulin regular (NOVOLIN R,HUMULIN R) 100 Units in sodium chloride 0.9 % 100 mL (1 Units/mL) infusion (not administered)  sodium chloride 0.9 % bolus 1,000 mL (1,000 mLs Intravenous New Bag/Given 10/29/16 1812)    And  0.9 %  sodium chloride infusion (not administered)  sodium chloride 0.9 % bolus 1,000 mL (0 mLs Intravenous Stopped 10/29/16 1812)  ondansetron (ZOFRAN) injection 4 mg (4 mg Intravenous Given 10/29/16 1652)     Initial  Impression / Assessment and Plan / ED Course  I have reviewed the triage vital signs and the nursing notes.  Pertinent labs & imaging results that were available during my care of the patient were reviewed by me and considered in my medical decision making (see chart for details).     BP (!) 152/97 (BP Location: Left Arm)   Pulse (!) 109   Temp 98.1 F (36.7 C) (Oral)   Resp 20   Ht _0  (1.676 m)   Wt 52.2 kg (115 lb)   SpO2 100%   BMI 18.56 kg/m    Final Clinical Impressions(s) / ED Diagnoses   Final diagnoses:  Type 1 diabetes mellitus with ketoacidosis without coma (HCC)    New Prescriptions New Prescriptions   No medications on file   Pt here with generalized weakness, n/v since this AM.  Attributed elevated CBG.  No recent sick contact or recent sickness.  Have been compliant with his meds according to pt.  His CBG currently is 287.  Work up initiated.  IVF and antinausea medication given.  Will monitor closely.   6:05 PM Additional labs review an elevated CBG of 339 with an anion gap of 22, bicarbonate 16 and evidence of ketones and urine. These finding consistence with DKA. We'll initiate glucose stabilizer as treatment. Anticipate admission for further management.  6:47 PM Appreciate  consultation from [facility] Dr. Lorin Mercy who agrees to see pt in the ER and will admit for further management of his DKA.    CRITICAL CARE Performed by: Domenic Moras Total critical care time: 30 minutes Critical care time was exclusive of separately billable procedures and treating other patients. Critical care was necessary to treat or prevent imminent or life-threatening deterioration. Critical care was time spent personally by me on the following activities: development of treatment plan with patient and/or surrogate as well as nursing, discussions with consultants, evaluation of patient's response to treatment, examination of patient, obtaining history from patient or surrogate, ordering and performing treatments and interventions, ordering and review of laboratory studies, ordering and review of radiographic studies, pulse oximetry and re-evaluation of patient's condition.    Domenic Moras, PA-C 10/29/16 [phone]

## 2016-10-29 NOTE — H&P (Addendum)
History and Physical    [name] [name] OIN:[id number] DOB: 1985-10-23 DOA: 10/29/2016  PCP: [name], MD Consultants:  None Patient coming from: Home - lives with brother and mother; [place]: mother, [phone]  Chief Complaint: "DKA"  HPI: [name] [name] is a 31 y.o. male with medical history significant of history of type 1 diabetes mellitus and 3 prior admissions over the last 6 months for diabetic ketoacidosis.  He reports that he started feeling bad this morning.  He is unsure why he is in DKA - reports taking insulin and not missing any doses.  Glucose 190.  Denies s/sx of infection.  +somnolence, confusion.  Denies pain.  +N/V at home this AM.  Reports last DKA was 6-7 months ago, and reports that it does not happen often.  In review of the chart, he has had the following admissions in the last 6 months: -4/7-9 - DKA because "he has not reenrolled in insulin program he had been a part of the last 3 years and he therefore ran out of his insulin."  Glu >600, HCO3 <7, incalculable gap, hypokalemia, hypomagnesemia, hypophosphatemia. -2/20-21 - Recurrent DKA likely secondary to meds and diet noncompliance. -1/8-10 - DKA due to med noncompliance   ED Course:  CBG 287 - given IVF and antinausea medication; gap 22, HCO3 16 = DKA.  Initiate glucose stabilizer.  Review of Systems: As per HPI; otherwise review of systems reviewed and negative.   Ambulatory Status: Ambualtes without assistance  Past Medical History:  Diagnosis Date  . Diabetes mellitus without complication ([place]) Dx 9476  . Renal insufficiency 2011   pt was on dialysis for a short time approx 2011, pt is no longer on dialysis    History reviewed. No pertinent surgical history.  Social History   Social History  . Marital status: Single    Spouse name: N/A  . Number of children: 1   . Years of education: 33    Occupational History  . 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 Smoking status:  Former Smoker    Quit date: 09/19/2013  . Smokeless tobacco: Never Used  . Alcohol use No  . Drug use: No  . Sexual activity: Yes    Birth control/ protection: Condom   Other Topics Concern  . Not on file   Social History Narrative   Lives with mother and older brother.   Has 1 daughter, age 70.           No Known Allergies  Family History  Problem Relation Age of Onset  . Diabetes Father   . Diabetes Maternal Grandmother   . Heart disease Neg Hx   . Hypertension Neg Hx     Prior to Admission medications   Medication Sig Start Date End Date Taking? Authorizing Provider  Insulin Glargine (LANTUS) 100 UNIT/ML Solostar Pen Inject 40 Units into the skin daily at 10 pm. 08/30/16  Yes Funches, Josalyn, MD  insulin lispro (HUMALOG) 100 UNIT/ML KiwkPen Inject 0.15 mLs (15 Units total) into the skin 3 (three) times daily. For PASS 08/30/16  Yes Funches, Josalyn, MD  Blood Glucose Monitoring Suppl (TRUE METRIX METER) w/Device KIT 1 each by Does not apply route as needed. Patient not taking: Reported on 08/26/2016 07/12/16   Florencia Reasons, MD  glucose blood (TRUE METRIX BLOOD GLUCOSE TEST) test strip 1 each by Other route 3 (three) times daily. Patient not taking: Reported on 08/26/2016 07/12/16   Florencia Reasons, MD  Insulin Syringe-Needle  U-100 (B-D INS SYRINGE 0.5CC/31GX5/16) 31G X 5/16" 0.5 ML MISC 1 each by Does not apply route 2 (two) times daily. Patient not taking: Reported on 07/12/2016 03/27/16   [name], MD  TRUEPLUS LANCETS 28G MISC 1 each by Does not apply route 3 (three) times daily. Patient not taking: Reported on 07/12/2016 03/11/16   Florencia Reasons, MD    Physical Exam: Vitals:   10/29/16 1602 10/29/16 1603 10/29/16 1815 10/29/16 1900  BP:  116/81 (!) 152/97 (!) 143/88  Pulse:  98 (!) 109 (!) 107  Resp:  _0 Temp:  98.2 F (36.8 C) 98.1 F (36.7 C)   TempSrc: Oral Oral Oral   SpO2:  100% 100% 99%  Weight:   52.2 kg (115 lb)   Height:   5' 6" (1.676 m)      General:   Appears somnolent but able to answer questions appropriately Eyes:  PERRL, EOMI, normal lids, iris ENT:  grossly normal hearing, lips & tongue, mmm Neck:  no LAD, masses or thyromegaly Cardiovascular:  RRR, no m/r/g. No LE edema.  Respiratory:  CTA bilaterally, no w/r/r. Normal respiratory effort. Abdomen:  soft, ntnd, NABS Skin:  no rash or induration seen on limited exam Musculoskeletal:  grossly normal tone BUE/BLE, good ROM, no bony abnormality Psychiatric:  grossly normal mood and affect, speech fluent and appropriate, AOx3 Neurologic:  CN 2-12 grossly intact, moves all extremities in coordinated fashion, sensation intact  Labs on Admission: I have personally reviewed following labs and imaging studies  CBC:  Recent Labs Lab 10/29/16 1643  WBC 11.0*  HGB 14.4  HCT 40.2  MCV 86.5  PLT 885   Basic Metabolic Panel:  Recent Labs Lab 10/29/16 1643  NA 137  K 4.3  CL 99*  CO2 16*  GLUCOSE 339*  BUN 17  CREATININE 0.95  CALCIUM 10.2  MG 1.8  PHOS 4.6   GFR: Estimated Creatinine Clearance: 82.4 mL/min (by C-G formula based on SCr of 0.95 mg/dL). Liver Function Tests: No results for input(s): AST, ALT, ALKPHOS, BILITOT, PROT, ALBUMIN in the last 168 hours. No results for input(s): LIPASE, AMYLASE in the last 168 hours. No results for input(s): AMMONIA in the last 168 hours. Coagulation Profile: No results for input(s): INR, PROTIME in the last 168 hours. Cardiac Enzymes: No results for input(s): CKTOTAL, CKMB, CKMBINDEX, TROPONINI in the last 168 hours. BNP (last 3 results) No results for input(s): PROBNP in the last 8760 hours. HbA1C: No results for input(s): HGBA1C in the last 72 hours. CBG:  Recent Labs Lab 10/29/16 1611 10/29/16 1642 10/29/16 1841  GLUCAP 287* 353* 382*   Lipid Profile: No results for input(s): CHOL, HDL, LDLCALC, TRIG, CHOLHDL, LDLDIRECT in the last 72 hours. Thyroid Function Tests: No results for input(s): TSH, T4TOTAL, FREET4,  T3FREE, THYROIDAB in the last 72 hours. Anemia Panel: No results for input(s): VITAMINB12, FOLATE, FERRITIN, TIBC, IRON, RETICCTPCT in the last 72 hours. Urine analysis:    Component Value Date/Time   COLORURINE YELLOW 10/29/2016 1640   APPEARANCEUR CLEAR 10/29/2016 1640   LABSPEC 1.024 10/29/2016 1640   PHURINE 5.0 10/29/2016 1640   GLUCOSEU >=500 (A) 10/29/2016 1640   HGBUR NEGATIVE 10/29/2016 1640   BILIRUBINUR NEGATIVE 10/29/2016 1640   BILIRUBINUR Negative 02/07/2016 1528   KETONESUR 80 (A) 10/29/2016 1640   PROTEINUR NEGATIVE 10/29/2016 1640   UROBILINOGEN 0.2 02/07/2016 1528   UROBILINOGEN 0.2 03/22/2015 0913   NITRITE NEGATIVE 10/29/2016 1640   LEUKOCYTESUR NEGATIVE 10/29/2016 1640  Creatinine Clearance: Estimated Creatinine Clearance: 82.4 mL/min (by C-G formula based on SCr of 0.95 mg/dL).  Sepsis Labs: @LABRCNTIP(procalcitonin:4,lacticidven:4) )No results found for this or any previous visit (from the past 240 hour(s)).   Radiological Exams on Admission: No results found.  EKG: not done  Assessment/Plan Principal Problem:   Diabetic ketoacidosis without coma associated with type 1 diabetes mellitus (HCC) Active Problems:   Substance abuse   DM (diabetes mellitus), type 1, uncontrolled (HCC)   AKI (acute kidney injury) (HCC)   DKA with h/o poorly controlled DM type 1 -Patient with poor baseline control -He reports medication compliance but has a h/o non-compliance and suspect that this is the source of his DKA (he also reports no h/o drug use as well as no recent DKA admissions) -No indication of illness as source -Moderate DKA on admission based on anion gap 22, HCO3 16, patient somewhat drowsy -Will admit to SDU with DKA protocol -Would recommend continuing insulin drip at least until morning regardless of rapidity of closure of gap and normalization of labs -K+ 4.3 at time of presentation; will add KCl supplementation now -Normal Mag/Phos (h/o low  levels for both) -Check BMP q 4h for now -NPO until gap closes and patient is no longer acidotic -IVF at 150 cc/hr, NS until glucose <250 and then decrease rate to 125 and change to D51/2NS -Glucose 287, 353, 339, 382 -WBC 11.0, likely reactive -UA: rare bacteria, >500 glucose, ketones 80  Substance abuse -UDS pending -His last 4 UDS have been positive for THC, and 7 of the 12 UDS in Epic -Suggest ongoing counseling once his current UDS is back -Patient does deny use and reports that his Bob Marley necklace is a reflection of his respect for his calm aura  AKI -Creatinine 0.95, baseline about 0.6 -Anticipate improvement with rehydration  DVT prophylaxis:  Lovenox  Code Status: Full  Family Communication: None present  Disposition Plan:  Home once clinically improved Consults called: None  Admission status: It is my clinical opinion that referral for OBSERVATION is reasonable and necessary in this patient based on the above information provided. The aforementioned taken together are felt to place the patient at high risk for further clinical deterioration. However it is anticipated that the patient may be medically stable for discharge from the hospital within 24 to 48 hours.      MD [facility]  If 7PM-7AM, please contact night-coverage [email or web address] Password [email or web address]  10/29/2016, 7:33 PM   

## 2016-10-30 DIAGNOSIS — E1010 Type 1 diabetes mellitus with ketoacidosis without coma: Secondary | ICD-10-CM

## 2016-10-30 LAB — BASIC METABOLIC PANEL
ANION GAP: 8 (ref 5–15)
ANION GAP: 7 (ref 5–15)
Anion gap: 7 (ref 5–15)
BUN: 14 mg/dL (ref 6–20)
BUN: 12 mg/dL (ref 6–20)
BUN: 12 mg/dL (ref 6–20)
CALCIUM: 8.4 mg/dL — AB (ref 8.9–10.3)
CALCIUM: 8.4 mg/dL — AB (ref 8.9–10.3)
CHLORIDE: 109 mmol/L (ref 101–111)
CO2: 19 mmol/L — ABNORMAL LOW (ref 22–32)
CO2: 21 mmol/L — ABNORMAL LOW (ref 22–32)
CO2: 22 mmol/L (ref 22–32)
CREATININE: 0.52 mg/dL — AB (ref 0.61–1.24)
Calcium: 8.4 mg/dL — ABNORMAL LOW (ref 8.9–10.3)
Chloride: 110 mmol/L (ref 101–111)
Chloride: 110 mmol/L (ref 101–111)
Creatinine, Ser: 0.61 mg/dL (ref 0.61–1.24)
Creatinine, Ser: 0.53 mg/dL — ABNORMAL LOW (ref 0.61–1.24)
GFR calc non Af Amer: >60 mL/min (ref >60)
GFR calc non Af Amer: >60 mL/min (ref >60)
GLUCOSE: 173 mg/dL — AB (ref 65–99)
GLUCOSE: 110 mg/dL — AB (ref 65–99)
Glucose, Bld: 141 mg/dL — ABNORMAL HIGH (ref 65–99)
POTASSIUM: 3.3 mmol/L — AB (ref 3.5–5.1)
Potassium: 3.5 mmol/L (ref 3.5–5.1)
Potassium: 3.3 mmol/L — ABNORMAL LOW (ref 3.5–5.1)
Sodium: 137 mmol/L (ref 135–145)
Sodium: 138 mmol/L (ref 135–145)
Sodium: 138 mmol/L (ref 135–145)

## 2016-10-30 LAB — GLUCOSE, CAPILLARY
GLUCOSE-CAPILLARY: 168 mg/dL — AB (ref 65–99)
GLUCOSE-CAPILLARY: 119 mg/dL — AB (ref 65–99)
GLUCOSE-CAPILLARY: 152 mg/dL — AB (ref 65–99)
Glucose-Capillary: 175 mg/dL — ABNORMAL HIGH (ref 65–99)
Glucose-Capillary: 107 mg/dL — ABNORMAL HIGH (ref 65–99)
Glucose-Capillary: 150 mg/dL — ABNORMAL HIGH (ref 65–99)
Glucose-Capillary: 108 mg/dL — ABNORMAL HIGH (ref 65–99)
Glucose-Capillary: 117 mg/dL — ABNORMAL HIGH (ref 65–99)
Glucose-Capillary: 127 mg/dL — ABNORMAL HIGH (ref 65–99)

## 2016-10-30 MED ORDER — INSULIN ASPART 100 UNIT/ML ~~LOC~~ SOLN
6.0000 [IU] | Freq: Three times a day (TID) | SUBCUTANEOUS | Status: DC
  Administered 2016-10-30 (×2): 6 [IU] via SUBCUTANEOUS

## 2016-10-30 MED ORDER — ENSURE ENLIVE PO LIQD
237.0000 mL | Freq: Two times a day (BID) | ORAL | Status: DC
  Administered 2016-10-30: 237 mL via ORAL

## 2016-10-30 MED ORDER — INSULIN ASPART 100 UNIT/ML ~~LOC~~ SOLN: 0.0000 [IU] | Freq: Every day | SUBCUTANEOUS | Status: DC

## 2016-10-30 MED ORDER — INSULIN ASPART 100 UNIT/ML ~~LOC~~ SOLN
0.0000 [IU] | Freq: Three times a day (TID) | SUBCUTANEOUS | Status: DC
  Administered 2016-10-30: 3 [IU] via SUBCUTANEOUS

## 2016-10-30 MED ORDER — ADULT MULTIVITAMIN W/MINERALS CH
1.0000 | ORAL_TABLET | Freq: Every day | ORAL | Status: DC
  Administered 2016-10-30: 1 via ORAL
  Filled 2016-10-30: qty 1

## 2016-10-30 MED ORDER — POTASSIUM CHLORIDE CRYS ER 20 MEQ PO TBCR
40.0000 meq | EXTENDED_RELEASE_TABLET | Freq: Once | ORAL | Status: AC
  Administered 2016-10-30: 40 meq via ORAL
  Filled 2016-10-30: qty 2

## 2016-10-30 MED ORDER — INSULIN GLARGINE 100 UNIT/ML ~~LOC~~ SOLN
40.0000 [IU] | Freq: Every day | SUBCUTANEOUS | Status: DC
  Administered 2016-10-30: 40 [IU] via SUBCUTANEOUS
  Filled 2016-10-30: qty 0.4

## 2016-10-30 NOTE — Discharge Instructions (Signed)

## 2016-10-30 NOTE — Progress Notes (Signed)
Initial Nutrition Assessment  DOCUMENTATION CODES:   Severe malnutrition in context of chronic illness, Underweight  INTERVENTION:  - Will order Ensure Enlive BID, each supplement provides 350 kcal and 20 grams of protein (will monitor CBGs closely). - Will order daily multivitamin with minerals. - Will monitor for additional needs.  NUTRITION DIAGNOSIS:   Malnutrition (severe) related to chronic illness (DM) as evidenced by moderate depletions of muscle mass, severe depletion of muscle mass, moderate depletion of body fat, severe depletion of body fat.  GOAL:   Patient will meet greater than or equal to 90% of their needs  MONITOR:   Diet advancement, Weight trends, Labs  REASON FOR ASSESSMENT:    (Low BMI)  ASSESSMENT:   31 y.o. male with medical history significant of history of type 1 diabetes mellitus and 3 prior admissions over the last 6 months for diabetic ketoacidosis.  He reports that he started feeling bad this morning.  He is unsure why he is in DKA - reports taking insulin and not missing any doses.  Glucose 190.  Denies s/sx of infection.  +somnolence, confusion.  Denies pain.  +N/V at home this AM.  Reports last DKA was 6-7 months ago, and reports that it does not happen often.  Pt seen for underweight BMI. Pt has been NPO since admission. Diet advanced to Carb Modified <5 minutes ago. Pt was seen by this RD in January when he was admitted for DKA; notes indicate pt was also admitted in February and April for the same. Pt reports that he has been compliant with medications and checking CBGs at home. Will talk more about food intake at follow-up.   Physical assessment shows moderate and severe muscle and moderate and severe fat wasting to upper and lower body. This is consistent with findings in January. Weight has been mainly stable since January.   Medications reviewed; sliding scale Novolog, 6 units Novolog TID, 40 units Lantus/day, 10 mEq IV KCl x2 runs yesterday,  40 mEq oral KCl x1 dose today.  Labs reviewed; CBGs: 107-175 mg/dL since midnight, K: 3.3 mmol/L, creatinine: 0.53 mg/dL, Ca: 8.4 mg/dL.  IVF: D5-1/2 NS @ 125 mL/hr (510 kcal from dextrose).    Diet Order:  Diet NPO time specified  Skin:  Reviewed, no issues  Last BM:  6/9 (PTA)  Height:   Ht Readings from Last 1 Encounters:  10/29/16 5\' 6"  (1.676 m)    Weight:   Wt Readings from Last 1 Encounters:  10/29/16 103 lb 13.4 oz (47.1 kg)    Ideal Body Weight:  64.54 kg  BMI:  Body mass index is 16.76 kg/m.  Estimated Nutritional Needs:   Kcal:  1650-1885 (35-40 grams/kg)  Protein:  60-70 grams  Fluid:  >/= 1.7 L/day  EDUCATION NEEDS:   No education needs identified at this time    Trenton GammonJessica Serenna Deroy, MS, RD, LDN, CNSC Inpatient Clinical Dietitian Pager # [phone] After hours/weekend pager # [phone]

## 2016-10-30 NOTE — Discharge Summary (Signed)
Physician Discharge Summary  [name] [name] BTD:[id number] DOB: 1985-05-24 DOA: 10/29/2016  PCP: [name] Nearing, MD  Admit date: 10/29/2016 Discharge date: 10/30/2016  Recommendations for Outpatient Follow-up:  1. Pt will need to follow up with PCP in 1-2 weeks post discharge 2. Please obtain BMP to evaluate electrolytes and kidney function 3. Please also check CBC to evaluate Hg and Hct levels  Discharge Diagnoses:  Principal Problem:   Diabetic ketoacidosis without coma associated with type 1 diabetes mellitus ([name] [name]) Active Problems:   Substance abuse   DM (diabetes mellitus), type 1, uncontrolled ([name] [name])   AKI (acute kidney injury) ([name] [name])   Discharge Condition: Stable  Diet recommendation: Heart healthy diet discussed in details   History of present illness:  31 yo male with known DM type I, reported ongoing nausea and poor oral intake several days duration associated with somnolence. He reported his symptoms were similar to his previous DKA.  Hospital Course:  DKA with h/o poorly controlled DM type 1 - in the setting of ? Non compliance with medical regimen  - pt reports feeling better, has been transitioned to home insulin regimen - off insulin drip - gap closed - pt wants to go home   Hypokalemia - supplemented and WNL  Substance abuse - UDS + for THC - pt counseled on cessation   Discharge Exam: Vitals:   10/30/16 0700 10/30/16 0800  BP:  (!) 134/96  Pulse: 94 95  Resp: 11 13  Temp:  98.6 F (37 C)   Vitals:   10/30/16 0500 10/30/16 0600 10/30/16 0700 10/30/16 0800  BP:  (!) 141/88  (!) 134/96  Pulse: (!) 109 100 94 95  Resp: '15 14 11 13  '$ Temp:    98.6 F (37 C)  TempSrc:    Oral  SpO2: 99% 98% 99% 100%  Weight:      Height:        General: Pt is alert, follows commands appropriately, not in acute distress Cardiovascular: Regular rate and rhythm, S1/S2 +, no murmurs, no rubs, no gallops Respiratory: Clear to auscultation bilaterally, no  wheezing, no crackles, no rhonchi Abdominal: Soft, non tender, non distended, bowel sounds +, no guarding Extremities: no edema, no cyanosis, pulses palpable bilaterally DP and PT Neuro: Grossly nonfocal  Discharge Instructions  Discharge Instructions    Diet - low sodium heart healthy    Complete by:  As directed    Increase activity slowly    Complete by:  As directed      Allergies as of 10/30/2016   No Known Allergies     Medication List    TAKE these medications   glucose blood test strip Commonly known as:  TRUE METRIX BLOOD GLUCOSE TEST 1 each by Other route 3 (three) times daily.   Insulin Glargine 100 UNIT/ML Solostar Pen Commonly known as:  LANTUS Inject 40 Units into the skin daily at 10 pm.   insulin lispro 100 UNIT/ML KiwkPen Commonly known as:  HUMALOG Inject 0.15 mLs (15 Units total) into the skin 3 (three) times daily. For PASS   Insulin Syringe-Needle U-100 31G X 5/16" 0.5 ML Misc Commonly known as:  B-D INS SYRINGE 0.5CC/31GX5/16 1 each by Does not apply route 2 (two) times daily.   TRUE METRIX METER w/Device Kit 1 each by Does not apply route as needed.   TRUEPLUS LANCETS 28G Misc 1 each by Does not apply route 3 (three) times daily.      Follow-up Information    Funches,  Adriana Mccallum, MD Follow up.   Specialty:  Family Medicine Contact information: [name] [address] [phone]            The results of significant diagnostics from this hospitalization (including imaging, microbiology, ancillary and laboratory) are listed below for reference.     Microbiology: Recent Results (from the past 240 hour(s))  MRSA PCR Screening     Status: None   Collection Time: 10/29/16 10:37 PM  Result Value Ref Range Status   MRSA by PCR NEGATIVE NEGATIVE Final    Comment:        The GeneXpert MRSA Assay (FDA approved for NASAL specimens only), is one component of a comprehensive MRSA colonization surveillance program. It is  not intended to diagnose MRSA infection nor to guide or monitor treatment for MRSA infections.      Labs: Basic Metabolic Panel:  Recent Labs Lab 10/29/16 1643 10/29/16 2201 10/30/16 0136 10/30/16 0528 10/30/16 0846  NA 137 139 137 138 138  K 4.3 4.0 3.5 3.3* 3.3*  CL 99* 111 110 110 109  CO2 16* 15* 19* 21* 22  GLUCOSE 339* 242* 173* 110* 141*  BUN '17 15 14 12 12  '$ CREATININE 0.95 0.77 0.61 0.53* 0.52*  CALCIUM 10.2 9.0 8.4* 8.4* 8.4*  MG 1.8  --   --   --   --   PHOS 4.6  --   --   --   --    CBC:  Recent Labs Lab 10/29/16 1643  WBC 11.0*  HGB 14.4  HCT 40.2  MCV 86.5  PLT 316   CBG:  Recent Labs Lab 10/30/16 0405 10/30/16 0507 10/30/16 0617 10/30/16 0711 10/30/16 0805  GLUCAP 150* 119* 108* 117* 127*   SIGNED: Time coordinating discharge:  30 minutes  [name] Ramsay, MD  [facility] 10/30/2016, 11:33 AM Pager [phone]  If 7PM-7AM, please contact night-coverage [email or web address] Password [email or web address]

## 2016-10-30 NOTE — Progress Notes (Signed)
Discharge instructions (including medications) discussed with and copy provided to patient/caregiver 

## 2016-10-30 NOTE — Care Management Note (Signed)
Case Management Note  Patient Details  Name: [name] [name] MRN: [id number] Date of Birth: August 31, 1985  Subjective/Objective:                  Patient coming from: Home - lives with brother and mother; [name] Loge[place]: mother, [phone]  Chief Complaint: "DKA"  HPI: [name] [name] is a [age] y.o. male with medical history significant of history of type 1 diabetes mellitus and 3 prior admissions over the last 6 months for diabetic ketoacidosis.  He reports that he started feeling bad this morning.  He is unsure why he is in DKA - reports taking insulin and not missing any doses.  Glucose 190.  Denies s/sx of infection.  +somnolence, confusion.  Denies pain.  +N/V at home this AM.  Reports last DKA was 6-7 months ago, and reports that it does not happen often.  In review of the chart, he has had the following admissions in the last 6 months: -4/7-9 - DKA because "he has not reenrolled in insulin program he had been a part of the last 3 years and he therefore ran out of his insulin."  Glu >600, HCO3 <7, incalculable gap, hypokalemia, hypomagnesemia, hypophosphatemia. -2/20-21 - Recurrent DKA likely secondary to meds and diet noncompliance. -1/8-10 - DKA due to med noncompliance   Action/Plan: Date:  October 30, 2016 Chart reviewed for concurrent status and case management needs. Will continue to follow patient progress. Discharge Planning: following for needs Expected discharge date: [id number] Marcelle SmilingRhonda Roxan Yamamoto, BSN, [place]3, [place]   [phone] Expected Discharge Date:                  Expected Discharge Plan:  Home/Self Care  In-House Referral:     Discharge planning Services  CM Consult  Post Acute Care Choice:    Choice offered to:     DME Arranged:    DME Agency:     HH Arranged:    HH Agency:     Status of Service:  In process, will continue to follow  If discussed at Long Length of Stay Meetings, dates discussed:    Additional Comments:  Golda AcreDavis, Densil Ottey Lynn, RN 10/30/2016, 9:33 AM

## 2016-10-31 ENCOUNTER — Telehealth: Payer: Self-pay

## 2016-10-31 NOTE — Telephone Encounter (Signed)
Attempted to contact the patient to discuss follow up medical care after is hospitalization including the [facility].  Call placed to # [phone] (M) x3 and the phone rang quickly and then went to a fast busy signal. Call also placed to #  [phone] (H) and [name] answered and stated that the patient is at work. Requested that the patient [facility] and then Gloster[place] hung up the phone.

## 2016-11-16 MED FILL — !NOVOLOG 100UNITS/ML VIAL: 100/ML | 28 days supply | Qty: 10 | Fill #2

## 2016-11-16 MED FILL — !LANTUS SOLOSTAR 100UNITS/M: 100 | 14 days supply | Qty: 6 | Fill #2

## 2016-12-08 ENCOUNTER — Encounter (HOSPITAL_COMMUNITY): Payer: Self-pay | Admitting: Emergency Medicine

## 2016-12-08 ENCOUNTER — Emergency Department (HOSPITAL_COMMUNITY)
Admission: EM | Admit: 2016-12-08 | Discharge: 2016-12-09 | Disposition: A | Discharge status: Home / Self Care | Payer: Self-pay | Attending: Emergency Medicine | Admitting: Emergency Medicine

## 2016-12-08 DIAGNOSIS — Z794 Long term (current) use of insulin: Secondary | ICD-10-CM | POA: Insufficient documentation

## 2016-12-08 DIAGNOSIS — R739 Hyperglycemia, unspecified: Secondary | ICD-10-CM

## 2016-12-08 DIAGNOSIS — E1165 Type 2 diabetes mellitus with hyperglycemia: Secondary | ICD-10-CM | POA: Insufficient documentation

## 2016-12-08 DIAGNOSIS — Z87891 Personal history of nicotine dependence: Secondary | ICD-10-CM | POA: Insufficient documentation

## 2016-12-08 DIAGNOSIS — K047 Periapical abscess without sinus: Secondary | ICD-10-CM | POA: Insufficient documentation

## 2016-12-08 DIAGNOSIS — F1210 Cannabis abuse, uncomplicated: Secondary | ICD-10-CM | POA: Insufficient documentation

## 2016-12-08 DIAGNOSIS — Z9114 Patient's other noncompliance with medication regimen: Secondary | ICD-10-CM | POA: Insufficient documentation

## 2016-12-08 LAB — BASIC METABOLIC PANEL
Anion gap: 13 (ref 5–15)
BUN: 9 mg/dL (ref 6–20)
CALCIUM: 9.2 mg/dL (ref 8.9–10.3)
CO2: 20 mmol/L — ABNORMAL LOW (ref 22–32)
CREATININE: 0.6 mg/dL — AB (ref 0.61–1.24)
Chloride: 97 mmol/L — ABNORMAL LOW (ref 101–111)
GFR calc Af Amer: >60 mL/min (ref >60)
Glucose, Bld: 504 mg/dL (ref 65–99)
Potassium: 4.5 mmol/L (ref 3.5–5.1)
SODIUM: 130 mmol/L — AB (ref 135–145)

## 2016-12-08 LAB — CBG MONITORING, ED: GLUCOSE-CAPILLARY: 330 mg/dL — AB (ref 65–99)

## 2016-12-08 LAB — CBC
HCT: 39.5 % (ref 39.0–52.0)
Hemoglobin: 13.7 g/dL (ref 13.0–17.0)
MCH: 29.8 pg (ref 26.0–34.0)
MCHC: 34.7 g/dL (ref 30.0–36.0)
MCV: 85.9 fL (ref 78.0–100.0)
PLATELETS: 149 10*3/uL — AB (ref 150–400)
RBC: 4.6 MIL/uL (ref 4.22–5.81)
RDW: 12.1 % (ref 11.5–15.5)
WBC: 10.7 10*3/uL — ABNORMAL HIGH (ref 4.0–10.5)

## 2016-12-08 MED ORDER — AMOXICILLIN 500 MG PO CAPS: 500.0000 mg | ORAL_CAPSULE | Freq: Three times a day (TID) | ORAL | 0 refills | Status: DC

## 2016-12-08 MED ORDER — SODIUM CHLORIDE 0.9 % IV BOLUS (SEPSIS)
1000.0000 mL | Freq: Once | INTRAVENOUS | Status: AC
  Administered 2016-12-08: 1000 mL via INTRAVENOUS

## 2016-12-08 MED ORDER — ACETAMINOPHEN 500 MG PO TABS
1000.0000 mg | ORAL_TABLET | Freq: Once | ORAL | Status: AC
Start: 2016-12-08 — End: 2016-12-08
  Administered 2016-12-08: 1000 mg via ORAL
  Filled 2016-12-08: qty 2

## 2016-12-08 MED ORDER — INSULIN ASPART 100 UNIT/ML ~~LOC~~ SOLN
15.0000 [IU] | Freq: Once | SUBCUTANEOUS | Status: AC
  Administered 2016-12-08: 15 [IU] via INTRAVENOUS
  Filled 2016-12-08: qty 1

## 2016-12-08 MED ORDER — AMOXICILLIN 500 MG PO CAPS
500.0000 mg | ORAL_CAPSULE | Freq: Once | ORAL | Status: AC
  Administered 2016-12-08: 500 mg via ORAL
  Filled 2016-12-08: qty 1

## 2016-12-08 MED ORDER — INSULIN GLARGINE 100 UNIT/ML ~~LOC~~ SOLN
40.0000 [IU] | Freq: Once | SUBCUTANEOUS | Status: AC
  Administered 2016-12-09: 40 [IU] via SUBCUTANEOUS
  Filled 2016-12-08: qty 0.4

## 2016-12-08 NOTE — ED Triage Notes (Signed)
Pt reports he woke up this morning with L sided facial swelling, pt does have dental caries in the area, pt reports minimal pain but states face feels "tight". Pt unsure if area is draining. Pt denies fever

## 2016-12-08 NOTE — ED Notes (Signed)
Pt verbalized understanding of d/c instructions and has no further questions. Pt is stable, A&Ox4, VSS.  

## 2016-12-08 NOTE — ED Notes (Signed)
Pt states he woke up just before 0800 this date and his left upper jaw was swollen. Pt denies any drainage but does admit to have poor dental hygiene.

## 2016-12-08 NOTE — Discharge Instructions (Addendum)
It was our pleasure to provide your ER care today - we hope that you feel better.  Rest. Drink plenty of fluids/water. Check blood sugar before bed tonight.   Take antibiotic as prescribed. Take acetaminophen and/or ibuprofen as need for pain.  You must take your insulin as prescribed - do not skip or miss doses.  Follow diabetic diet.  We did give you your nighttime insulin in the ER, so do not take any additional insulin tonight.  Check blood sugars 4x/day and record values. Follow up with your doctor this Monday for recheck.  Also follow up with your dentist in the next few days - call office this Monday to arrange appointment.  Return to ER if worse, new symptoms, fevers, increased swelling, severe pain, vomiting, weak/faint, other concern.

## 2016-12-08 NOTE — ED Provider Notes (Addendum)
[facility] DEPT Provider Note   CSN: [id number] Arrival date & time: 12/08/16  1956     History   Chief Complaint Chief Complaint  Patient presents with  . Facial Swelling    HPI Ceejay Kegley is a 31 y.o. male.  Patient c/o left facial/mandible area swelling onset this AM.  Mild pain to area, constant, dull, non radiating. States had a couple teeth pulled on that side last month - unsure who did it or where, states it was arranged through [facility]. Denies fever or chills. Does not feel ill or sick (is eating [employer] currently).  Has hx iddm, states compliant w meds, no nv, no polyuria/polydipsia. No sore throat or trouble swallowing. No neck pain. No headache.    The history is provided by the patient.    Past Medical History:  Diagnosis Date  . Diabetes mellitus without complication ([place]) Dx 5462  . Renal insufficiency 2011   pt was on dialysis for a short time approx 2011, pt is no longer on dialysis    Patient Active Problem List   Diagnosis Date Noted  . Hypophosphatemia 08/27/2016  . AKI (acute kidney injury) ([place]) 07/11/2016  . Hyperkalemia 03/09/2016  . Renal insufficiency 03/09/2016  . Leukocytosis 03/09/2016  . Diabetic ketoacidosis without coma associated with type 1 diabetes mellitus ([place])   . Non-intractable vomiting with nausea   . Chest pain 12/18/2015  . Marijuana use 07/22/2015  . Proteinuria with type 1 diabetes mellitus ([place]) 07/22/2015  . High blood pressure 07/22/2015  . Uncontrolled type 1 diabetes mellitus with ketoacidosis without coma ([place])   . Hypocalcemia   . Acute renal failure ([place])   . Hypokalemia   . Hypomagnesemia   . Abnormal EKG 06/15/2015  . Sinus tachycardia   . Tachypnea   . Tinea pedis 12/17/2014  . Adjustment disorder with mixed anxiety and depressed mood 09/03/2014  . Protein-calorie malnutrition, severe ([place]) 07/06/2014  . DM (diabetes mellitus), type 1, uncontrolled ([place]) 05/01/2014  .  HLD (hyperlipidemia) 04/26/2014  . Substance abuse   . Dehydration 07/07/2013  . Fatigue 03/10/2012    History reviewed. No pertinent surgical history.     Home Medications    Prior to Admission medications   Medication Sig Start Date End Date Taking? Authorizing Provider  insulin aspart (NOVOLOG) 100 UNIT/ML injection Inject 15 Units into the skin 3 (three) times daily before meals.   Yes [provider]  insulin glargine (LANTUS) 100 unit/mL SOPN Inject 35 Units into the skin at bedtime.   Yes [provider]  PENICILLIN V POTASSIUM PO Take 1 tablet by mouth once.   Yes [provider]  Blood Glucose Monitoring Suppl (TRUE METRIX METER) w/Device KIT 1 each by Does not apply route as needed. 07/12/16   Florencia Reasons, MD  glucose blood (TRUE METRIX BLOOD GLUCOSE TEST) test strip 1 each by Other route 3 (three) times daily. 07/12/16   Florencia Reasons, MD  Insulin Glargine (LANTUS) 100 UNIT/ML Solostar Pen Inject 40 Units into the skin daily at 10 pm. Patient not taking: Reported on 12/08/2016 08/30/16   [name], MD  insulin lispro (HUMALOG) 100 UNIT/ML KiwkPen Inject 0.15 mLs (15 Units total) into the skin 3 (three) times daily. For PASS Patient not taking: Reported on 12/08/2016 08/30/16   [name], MD  Insulin Syringe-Needle U-100 (B-D INS SYRINGE 0.5CC/31GX5/16) 31G X 5/16" 0.5 ML MISC 1 each by Does not apply route 2 (two) times daily. 03/27/16  Funches, Josalyn, MD  TRUEPLUS LANCETS 28G MISC 1 each by Does not apply route 3 (three) times daily. 03/11/16   Florencia Reasons, MD    Family History Family History  Problem Relation Age of Onset  . Diabetes Father   . Diabetes Maternal Grandmother   . Heart disease Neg Hx   . Hypertension Neg Hx     Social History Social History  Substance Use Topics  . Smoking status: Former Smoker    Quit date: 09/19/2013  . Smokeless tobacco: Never Used  . Alcohol use No     Allergies   Patient has no known  allergies.   Review of Systems Review of Systems  Constitutional: Negative for fever.  HENT: Negative for sore throat.   Eyes: Negative for redness.  Respiratory: Negative for shortness of breath.   Cardiovascular: Negative for chest pain.  Gastrointestinal: Negative for abdominal pain and vomiting.  Endocrine: Negative for polyuria.  Genitourinary: Negative for flank pain.  Musculoskeletal: Negative for neck pain.  Skin: Negative for rash.  Neurological: Negative for headaches.  Hematological: Does not bruise/bleed easily.  Psychiatric/Behavioral: Negative for confusion.     Physical Exam Updated Vital Signs BP (!) 133/91 (BP Location: Right Arm)   Pulse (!) 108   Temp 99.1 F (37.3 C) (Oral)   Resp 18   Ht 1.676 m ('5\' 6"'$ )   Wt 53.1 kg (117 lb)   SpO2 98%   BMI 18.88 kg/m   Physical Exam  Constitutional: He appears well-developed and well-nourished. No distress.  HENT:  Mouth/Throat: Oropharynx is clear and moist.  Previously pulled teeth left lower/gums healed/closed, no purulent drainage. Left upper molar decay, associated gum swelling/tenderness w mild left facial swelling. No throat or floor of mouth pain or swelling. No neck swelling or tenderness.   Eyes: Conjunctivae are normal.  Neck: Normal range of motion. Neck supple. No tracheal deviation present.  Cardiovascular: Normal rate, regular rhythm, normal heart sounds and intact distal pulses.   Pulmonary/Chest: Effort normal and breath sounds normal. No accessory muscle usage. No respiratory distress.  Abdominal: He exhibits no distension. There is no tenderness.  Musculoskeletal: He exhibits no edema.  Lymphadenopathy:    He has no cervical adenopathy.  Neurological: He is alert.  Skin: Skin is warm and dry. He is not diaphoretic.  Psychiatric: He has a normal mood and affect.  Nursing note and vitals reviewed.    ED Treatments / Results  Labs (all labs ordered are listed, but only abnormal results are  displayed)  Results for orders placed or performed during the hospital encounter of 12/08/16  CBC  Result Value Ref Range   WBC 10.7 (H) 4.0 - 10.5 K/uL   RBC 4.60 4.22 - 5.81 MIL/uL   Hemoglobin 13.7 13.0 - 17.0 g/dL   HCT 39.5 39.0 - 52.0 %   MCV 85.9 78.0 - 100.0 fL   MCH 29.8 26.0 - 34.0 pg   MCHC 34.7 30.0 - 36.0 g/dL   RDW 12.1 11.5 - 15.5 %   Platelets 149 (L) 150 - 400 K/uL  Basic metabolic panel  Result Value Ref Range   Sodium 130 (L) 135 - 145 mmol/L   Potassium 4.5 3.5 - 5.1 mmol/L   Chloride 97 (L) 101 - 111 mmol/L   CO2 20 (L) 22 - 32 mmol/L   Glucose, Bld 504 (HH) 65 - 99 mg/dL   BUN 9 6 - 20 mg/dL   Creatinine, Ser 0.60 (L) 0.61 - 1.24 mg/dL  Calcium 9.2 8.9 - 10.3 mg/dL   GFR calc non Af Amer >60 >60 mL/min   GFR calc Af Amer >60 >60 mL/min   Anion gap 13 5 - 15   EKG  EKG Interpretation None       Radiology No results found.  Procedures Procedures (including critical care time)  Medications Ordered in ED Medications - No data to display   Initial Impression / Assessment and Plan / ED Course  I have reviewed the triage vital signs and the nursing notes.  Pertinent labs & imaging results that were available during my care of the patient were reviewed by me and considered in my medical decision making (see chart for details).  Pt currently eating meal.  Appears comfortable.  Labs previously sent.    Tolerating po fluids well.   Suspect dental abscess. Confirmed nkda w pt.   Acetaminophen po. Amoxicillin po. Icepack.  Rec close outpt dental follow up.   Pt initially indicates he took his insulin as prescribed. From labs, glucose markedly elevated 504. Pt now indicates has not taken any insulin today.  Pt confirms he normally takes lantus 40 at night and regular 15 - he has not taken either tonight.  Pt does indicate he has adequate insulin at home.  lantus 40 units sq, novolog sq.  Iv ns bolus. hco3 normal.  Pt ate full meal while in ED.   No nv.   Signed out to Dr Christy Gentles, plan for ivf, cbg x 2.    Final Clinical Impressions(s) / ED Diagnoses   Final diagnoses:  None    New Prescriptions New Prescriptions   No medications on file            Lajean Saver, MD 12/08/16 2336

## 2016-12-09 NOTE — ED Notes (Signed)
Checked CBG 330, RN Scott informed

## 2016-12-25 MED FILL — !NOVOLOG 100UNITS/ML VIAL: 100/ML | 28 days supply | Qty: 10 | Fill #3

## 2016-12-27 ENCOUNTER — Other Ambulatory Visit: Payer: Self-pay

## 2016-12-27 DIAGNOSIS — E1010 Type 1 diabetes mellitus with ketoacidosis without coma: Secondary | ICD-10-CM

## 2016-12-27 MED ORDER — INSULIN GLARGINE 100 UNIT/ML SOLOSTAR PEN: 40.0000 [IU] | PEN_INJECTOR | Freq: Every day | SUBCUTANEOUS | 6 refills | Status: DC

## 2016-12-27 MED FILL — $LANTUS SOLOSTAR 100 UNITS/: 100 | 28 days supply | Qty: 12 | Fill #0

## 2017-01-09 ENCOUNTER — Ambulatory Visit: Payer: Self-pay | Admitting: Family Medicine

## 2017-01-14 IMAGING — DX DG CHEST 2V
2 series · 2 of 2 positions shown · non-contrast
Comparison: Chest radiograph December 18, 2015

CLINICAL DATA: Shortness of breath and central chest pain. History
of hypertension, diabetes.

EXAM:
CHEST  2 VIEW

[chest pa]
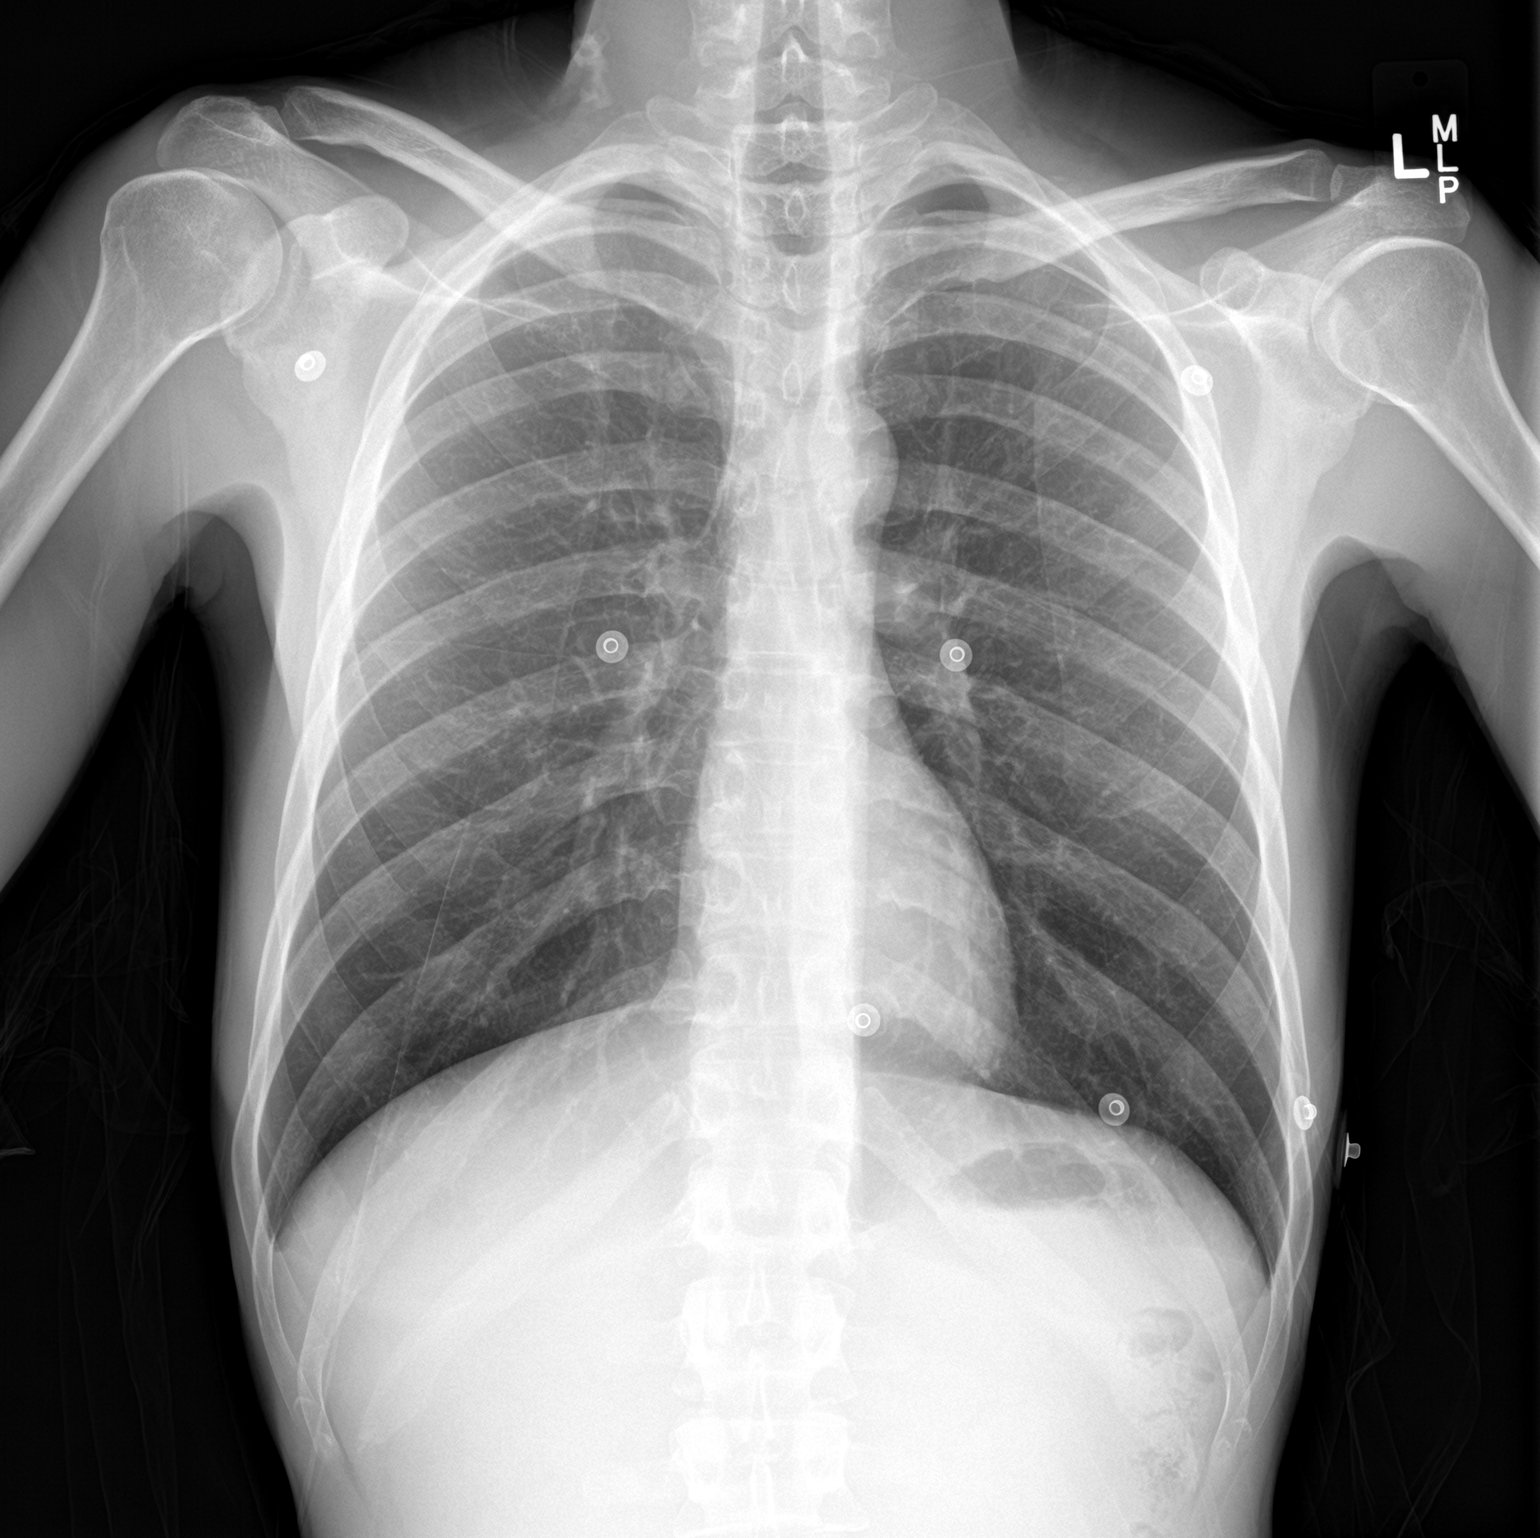

[chest lat]
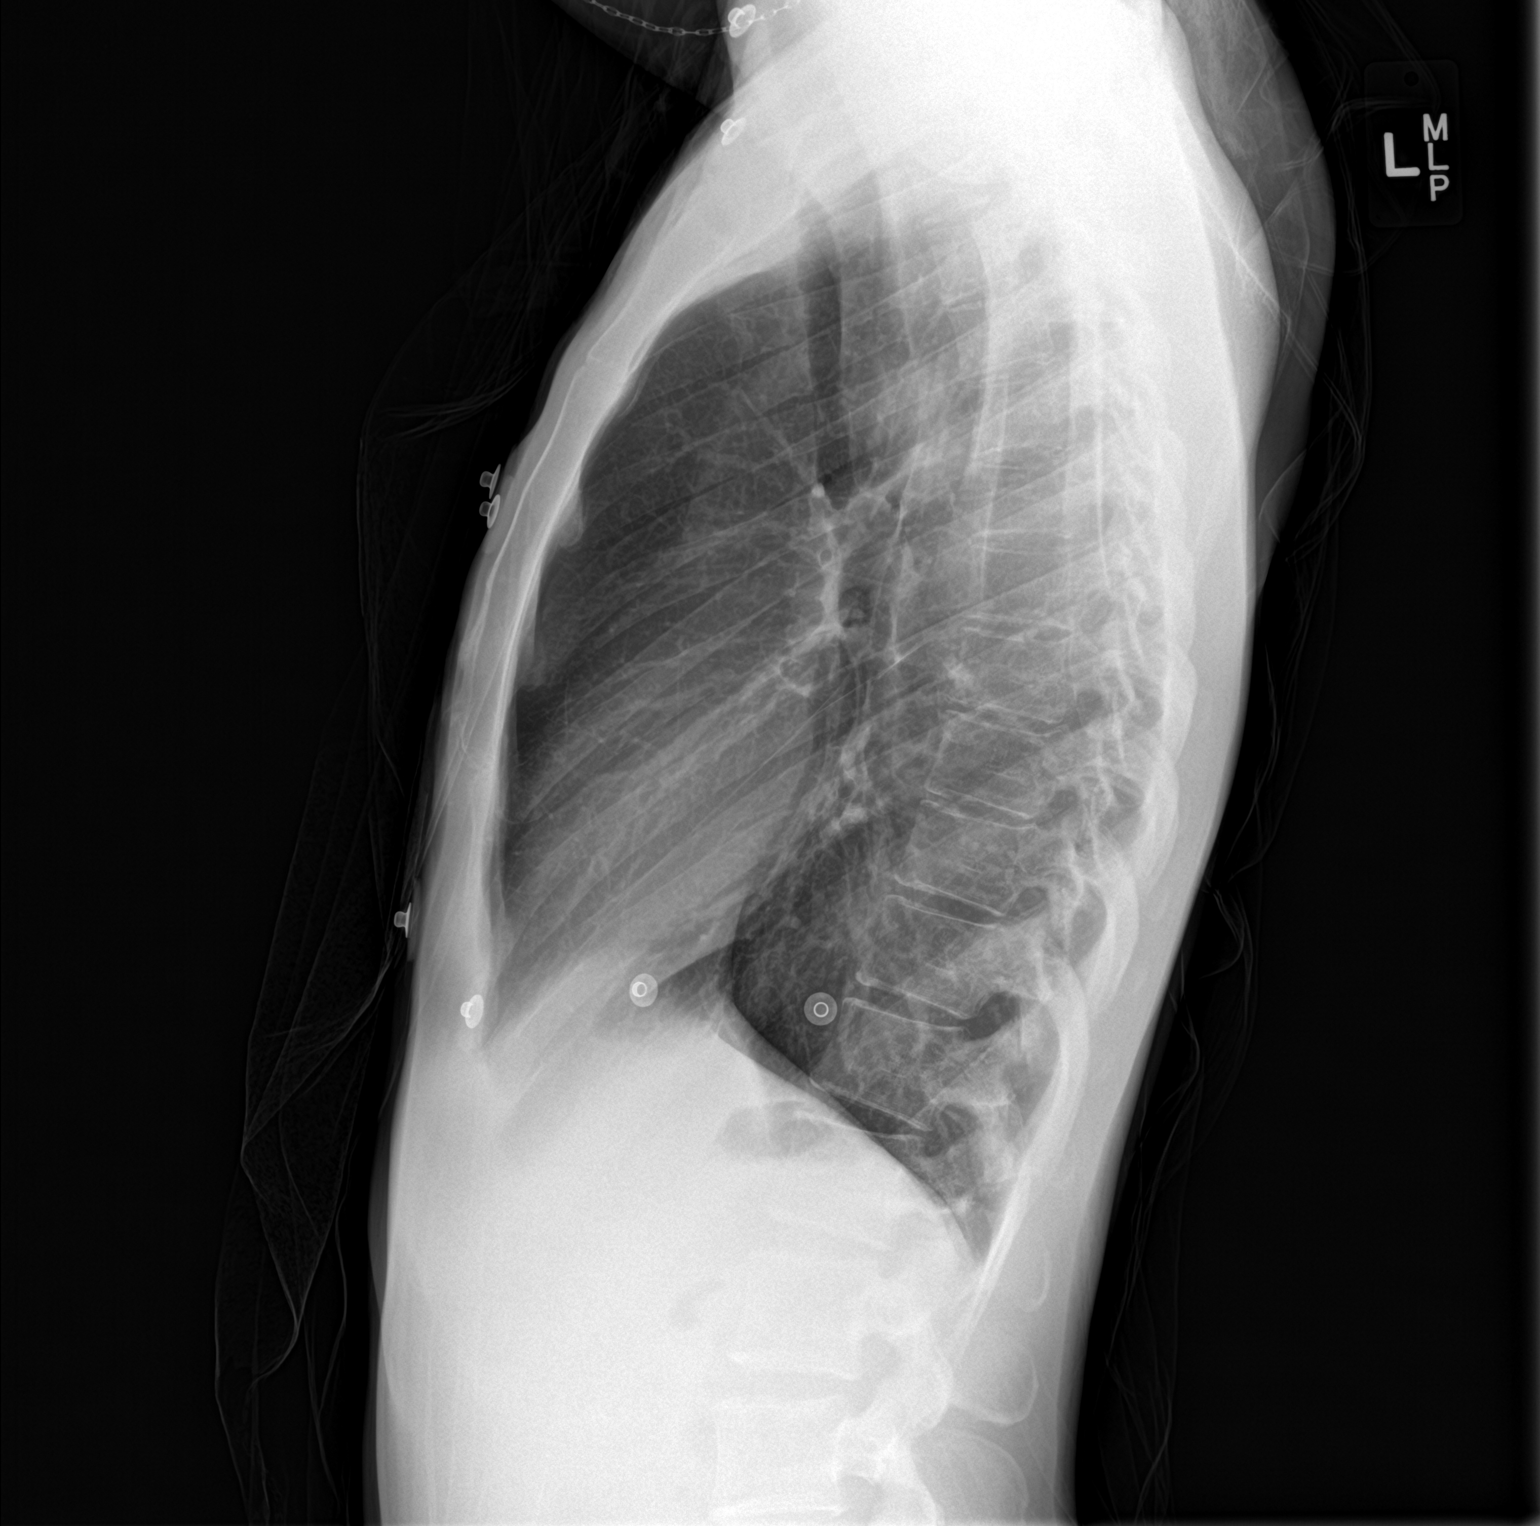

[2 of 2 positions shown; findings below may reference images not displayed]

FINDINGS: Cardiomediastinal silhouette is normal. No pleural effusions or
focal consolidations. Trachea projects midline and there is no
pneumothorax. Soft tissue planes and included osseous structures are
non-suspicious.
IMPRESSION: Normal chest.

## 2017-03-09 ENCOUNTER — Inpatient Hospital Stay (HOSPITAL_COMMUNITY)
Admission: EM | Admit: 2017-03-09 | Discharge: 2017-03-12 | DRG: 639 | Disposition: A | Discharge status: Home / Self Care | Payer: Self-pay | Attending: Internal Medicine | Admitting: Internal Medicine

## 2017-03-09 ENCOUNTER — Encounter (HOSPITAL_COMMUNITY): Payer: Self-pay

## 2017-03-09 DIAGNOSIS — Z833 Family history of diabetes mellitus: Secondary | ICD-10-CM

## 2017-03-09 DIAGNOSIS — E1010 Type 1 diabetes mellitus with ketoacidosis without coma: Principal | ICD-10-CM | POA: Diagnosis present

## 2017-03-09 DIAGNOSIS — E876 Hypokalemia: Secondary | ICD-10-CM | POA: Diagnosis present

## 2017-03-09 DIAGNOSIS — D72829 Elevated white blood cell count, unspecified: Secondary | ICD-10-CM | POA: Diagnosis present

## 2017-03-09 DIAGNOSIS — E785 Hyperlipidemia, unspecified: Secondary | ICD-10-CM | POA: Diagnosis present

## 2017-03-09 DIAGNOSIS — Z794 Long term (current) use of insulin: Secondary | ICD-10-CM

## 2017-03-09 DIAGNOSIS — E10649 Type 1 diabetes mellitus with hypoglycemia without coma: Secondary | ICD-10-CM

## 2017-03-09 DIAGNOSIS — F1910 Other psychoactive substance abuse, uncomplicated: Secondary | ICD-10-CM | POA: Diagnosis present

## 2017-03-09 DIAGNOSIS — IMO0002 Reserved for concepts with insufficient information to code with codable children: Secondary | ICD-10-CM | POA: Diagnosis present

## 2017-03-09 DIAGNOSIS — F1210 Cannabis abuse, uncomplicated: Secondary | ICD-10-CM | POA: Diagnosis present

## 2017-03-09 DIAGNOSIS — E1065 Type 1 diabetes mellitus with hyperglycemia: Secondary | ICD-10-CM | POA: Diagnosis present

## 2017-03-09 DIAGNOSIS — Z9119 Patient's noncompliance with other medical treatment and regimen: Secondary | ICD-10-CM

## 2017-03-09 DIAGNOSIS — Z87891 Personal history of nicotine dependence: Secondary | ICD-10-CM

## 2017-03-09 DIAGNOSIS — E860 Dehydration: Secondary | ICD-10-CM | POA: Diagnosis present

## 2017-03-09 HISTORY — DX: Type 1 diabetes mellitus without complications: E10.9

## 2017-03-09 HISTORY — DX: Type 2 diabetes mellitus with ketoacidosis without coma: E11.10

## 2017-03-09 HISTORY — DX: Acute kidney failure, unspecified: N17.9

## 2017-03-09 LAB — CBC
HCT: 43.2 % (ref 39.0–52.0)
HEMATOCRIT: 42.1 % (ref 39.0–52.0)
Hemoglobin: 15.2 g/dL (ref 13.0–17.0)
Hemoglobin: 15 g/dL (ref 13.0–17.0)
MCH: 29.9 pg (ref 26.0–34.0)
MCH: 30.5 pg (ref 26.0–34.0)
MCHC: 35.2 g/dL (ref 30.0–36.0)
MCHC: 35.6 g/dL (ref 30.0–36.0)
MCV: 84.9 fL (ref 78.0–100.0)
MCV: 85.6 fL (ref 78.0–100.0)
PLATELETS: 286 10*3/uL (ref 150–400)
Platelets: 264 10*3/uL (ref 150–400)
RBC: 5.09 MIL/uL (ref 4.22–5.81)
RBC: 4.92 MIL/uL (ref 4.22–5.81)
RDW: 12.2 % (ref 11.5–15.5)
RDW: 12.4 % (ref 11.5–15.5)
WBC: 9.5 10*3/uL (ref 4.0–10.5)
WBC: 13.3 10*3/uL — AB (ref 4.0–10.5)

## 2017-03-09 LAB — BASIC METABOLIC PANEL
Anion gap: 23 — ABNORMAL HIGH (ref 5–15)
Anion gap: 20 — ABNORMAL HIGH (ref 5–15)
Anion gap: 10 (ref 5–15)
Anion gap: 9 (ref 5–15)
BUN: 12 mg/dL (ref 6–20)
BUN: 13 mg/dL (ref 6–20)
BUN: 13 mg/dL (ref 6–20)
BUN: 12 mg/dL (ref 6–20)
CHLORIDE: 102 mmol/L (ref 101–111)
CHLORIDE: 113 mmol/L — AB (ref 101–111)
CHLORIDE: 109 mmol/L (ref 101–111)
CO2: 16 mmol/L — ABNORMAL LOW (ref 22–32)
CO2: 12 mmol/L — AB (ref 22–32)
CO2: 15 mmol/L — AB (ref 22–32)
CO2: 17 mmol/L — AB (ref 22–32)
CREATININE: 1.2 mg/dL (ref 0.61–1.24)
Calcium: 9.7 mg/dL (ref 8.9–10.3)
Calcium: 9.3 mg/dL (ref 8.9–10.3)
Calcium: 8.2 mg/dL — ABNORMAL LOW (ref 8.9–10.3)
Calcium: 8.2 mg/dL — ABNORMAL LOW (ref 8.9–10.3)
Chloride: 96 mmol/L — ABNORMAL LOW (ref 101–111)
Creatinine, Ser: 1.2 mg/dL (ref 0.61–1.24)
Creatinine, Ser: 0.99 mg/dL (ref 0.61–1.24)
Creatinine, Ser: 0.84 mg/dL (ref 0.61–1.24)
GFR calc Af Amer: >60 mL/min (ref >60)
GFR calc Af Amer: >60 mL/min (ref >60)
GFR calc Af Amer: >60 mL/min (ref >60)
GFR calc non Af Amer: >60 mL/min (ref >60)
GFR calc non Af Amer: >60 mL/min (ref >60)
GFR calc non Af Amer: >60 mL/min (ref >60)
Glucose, Bld: 353 mg/dL — ABNORMAL HIGH (ref 65–99)
Glucose, Bld: 433 mg/dL — ABNORMAL HIGH (ref 65–99)
Glucose, Bld: 197 mg/dL — ABNORMAL HIGH (ref 65–99)
Glucose, Bld: 185 mg/dL — ABNORMAL HIGH (ref 65–99)
POTASSIUM: 4.2 mmol/L (ref 3.5–5.1)
POTASSIUM: 4.9 mmol/L (ref 3.5–5.1)
POTASSIUM: 4.3 mmol/L (ref 3.5–5.1)
POTASSIUM: 3.5 mmol/L (ref 3.5–5.1)
SODIUM: 135 mmol/L (ref 135–145)
SODIUM: 134 mmol/L — AB (ref 135–145)
SODIUM: 138 mmol/L (ref 135–145)
SODIUM: 135 mmol/L (ref 135–145)

## 2017-03-09 LAB — CBC WITH DIFFERENTIAL/PLATELET
BASOS ABS: 0 10*3/uL (ref 0.0–0.1)
BASOS PCT: 0 %
EOS ABS: 0 10*3/uL (ref 0.0–0.7)
Eosinophils Relative: 0 %
HCT: 44.1 % (ref 39.0–52.0)
Hemoglobin: 15.8 g/dL (ref 13.0–17.0)
LYMPHS PCT: 7 %
Lymphs Abs: 0.7 10*3/uL (ref 0.7–4.0)
MCH: 30.2 pg (ref 26.0–34.0)
MCHC: 35.8 g/dL (ref 30.0–36.0)
MCV: 84.2 fL (ref 78.0–100.0)
MONO ABS: 0.3 10*3/uL (ref 0.1–1.0)
Monocytes Relative: 3 %
NEUTROS PCT: 90 %
Neutro Abs: 9.1 10*3/uL — ABNORMAL HIGH (ref 1.7–7.7)
PLATELETS: UNDETERMINED 10*3/uL (ref 150–400)
RBC: 5.24 MIL/uL (ref 4.22–5.81)
RDW: 12.8 % (ref 11.5–15.5)
WBC: 10.1 10*3/uL (ref 4.0–10.5)

## 2017-03-09 LAB — RAPID URINE DRUG SCREEN, HOSP PERFORMED
Amphetamines: NOT DETECTED
BARBITURATES: NOT DETECTED
Benzodiazepines: NOT DETECTED
COCAINE: NOT DETECTED
Opiates: NOT DETECTED
Tetrahydrocannabinol: POSITIVE — AB

## 2017-03-09 LAB — URINALYSIS, ROUTINE W REFLEX MICROSCOPIC
Bacteria, UA: NONE SEEN
Bilirubin Urine: NEGATIVE
Hgb urine dipstick: NEGATIVE
Ketones, ur: 80 mg/dL — AB
LEUKOCYTES UA: NEGATIVE
NITRITE: NEGATIVE
PH: 5 (ref 5.0–8.0)
Protein, ur: 30 mg/dL — AB
RBC / HPF: NONE SEEN RBC/hpf (ref 0–5)
Specific Gravity, Urine: 1.025 (ref 1.005–1.030)

## 2017-03-09 LAB — CBG MONITORING, ED
GLUCOSE-CAPILLARY: 378 mg/dL — AB (ref 65–99)
Glucose-Capillary: 381 mg/dL — ABNORMAL HIGH (ref 65–99)
Glucose-Capillary: 352 mg/dL — ABNORMAL HIGH (ref 65–99)
Glucose-Capillary: 280 mg/dL — ABNORMAL HIGH (ref 65–99)
Glucose-Capillary: 189 mg/dL — ABNORMAL HIGH (ref 65–99)
Glucose-Capillary: 203 mg/dL — ABNORMAL HIGH (ref 65–99)

## 2017-03-09 LAB — HEPATIC FUNCTION PANEL
ALBUMIN: 4.7 g/dL (ref 3.5–5.0)
ALT: 24 U/L (ref 17–63)
AST: 23 U/L (ref 15–41)
Alkaline Phosphatase: 74 U/L (ref 38–126)
BILIRUBIN TOTAL: 1.4 mg/dL — AB (ref 0.3–1.2)
Bilirubin, Direct: 0.2 mg/dL (ref 0.1–0.5)
Indirect Bilirubin: 1.2 mg/dL — ABNORMAL HIGH (ref 0.3–0.9)
Total Protein: 8 g/dL (ref 6.5–8.1)

## 2017-03-09 LAB — LIPASE, BLOOD: LIPASE: 18 U/L (ref 11–51)

## 2017-03-09 LAB — I-STAT VENOUS BLOOD GAS, ED
Acid-base deficit: 9 mmol/L — ABNORMAL HIGH (ref 0.0–2.0)
Bicarbonate: 14.8 mmol/L — ABNORMAL LOW (ref 20.0–28.0)
O2 SAT: 95 %
PCO2 VEN: 28 mmHg — AB (ref 44.0–60.0)
PO2 VEN: 81 mmHg — AB (ref 32.0–45.0)
TCO2: 16 mmol/L — AB (ref 22–32)
pH, Ven: 7.33 (ref 7.250–7.430)

## 2017-03-09 LAB — ETHANOL

## 2017-03-09 LAB — LIPID PANEL
CHOL/HDL RATIO: 3.1 ratio
Cholesterol: 197 mg/dL (ref 0–200)
HDL: 63 mg/dL (ref >40)
LDL Cholesterol: 98 mg/dL (ref 0–99)
TRIGLYCERIDES: 180 mg/dL — AB (ref <150)
VLDL: 36 mg/dL (ref 0–40)

## 2017-03-09 LAB — MAGNESIUM: Magnesium: 1.8 mg/dL (ref 1.7–2.4)

## 2017-03-09 LAB — GLUCOSE, CAPILLARY
GLUCOSE-CAPILLARY: 142 mg/dL — AB (ref 65–99)
GLUCOSE-CAPILLARY: 143 mg/dL — AB (ref 65–99)
GLUCOSE-CAPILLARY: 218 mg/dL — AB (ref 65–99)
GLUCOSE-CAPILLARY: 98 mg/dL (ref 65–99)
Glucose-Capillary: 232 mg/dL — ABNORMAL HIGH (ref 65–99)

## 2017-03-09 LAB — HEMOGLOBIN A1C
Hgb A1c MFr Bld: 12.1 % — ABNORMAL HIGH (ref 4.8–5.6)
MEAN PLASMA GLUCOSE: 300.57 mg/dL

## 2017-03-09 MED ORDER — SODIUM CHLORIDE 0.9 % IV SOLN
INTRAVENOUS | Status: DC
  Filled 2017-03-09: qty 1

## 2017-03-09 MED ORDER — SODIUM CHLORIDE 0.9 % IV SOLN
INTRAVENOUS | Status: DC
  Administered 2017-03-09: 2.9 [IU]/h via INTRAVENOUS
  Administered 2017-03-10: 4.4 [IU]/h via INTRAVENOUS
  Administered 2017-03-10: 1.9 [IU]/h via INTRAVENOUS
  Administered 2017-03-10: 2.5 [IU]/h via INTRAVENOUS
  Administered 2017-03-10: 2.4 [IU]/h via INTRAVENOUS
  Administered 2017-03-10: 2 [IU]/h via INTRAVENOUS
  Administered 2017-03-10: 3.1 [IU]/h via INTRAVENOUS
  Filled 2017-03-09: qty 1

## 2017-03-09 MED ORDER — ACETAMINOPHEN 325 MG PO TABS
650.0000 mg | ORAL_TABLET | Freq: Four times a day (QID) | ORAL | Status: DC | PRN
  Administered 2017-03-09 – 2017-03-11 (×3): 650 mg via ORAL
  Filled 2017-03-09 (×3): qty 2

## 2017-03-09 MED ORDER — DEXTROSE-NACL 5-0.45 % IV SOLN
INTRAVENOUS | Status: DC
  Administered 2017-03-09 – 2017-03-10 (×3): via INTRAVENOUS

## 2017-03-09 MED ORDER — ONDANSETRON HCL 4 MG/2ML IJ SOLN
4.0000 mg | Freq: Once | INTRAMUSCULAR | Status: AC
  Administered 2017-03-09: 4 mg via INTRAVENOUS
  Filled 2017-03-09: qty 2

## 2017-03-09 MED ORDER — POTASSIUM CHLORIDE 10 MEQ/100ML IV SOLN
10.0000 meq | INTRAVENOUS | Status: AC
  Administered 2017-03-09: 10 meq via INTRAVENOUS
  Filled 2017-03-09: qty 100

## 2017-03-09 MED ORDER — SODIUM CHLORIDE 0.9 % IV SOLN
INTRAVENOUS | Status: AC
  Administered 2017-03-09: 12:00:00 via INTRAVENOUS

## 2017-03-09 MED ORDER — SODIUM CHLORIDE 0.9 % IV SOLN
INTRAVENOUS | Status: DC
  Administered 2017-03-09: 12:00:00 via INTRAVENOUS

## 2017-03-09 MED ORDER — ONDANSETRON 4 MG PO TBDP
4.0000 mg | ORAL_TABLET | Freq: Once | ORAL | Status: DC | PRN
  Filled 2017-03-09: qty 1

## 2017-03-09 MED ORDER — ENOXAPARIN SODIUM 40 MG/0.4ML ~~LOC~~ SOLN
40.0000 mg | SUBCUTANEOUS | Status: DC
  Administered 2017-03-09 – 2017-03-10 (×2): 40 mg via SUBCUTANEOUS
  Filled 2017-03-09 (×2): qty 0.4

## 2017-03-09 MED ORDER — SODIUM CHLORIDE 0.9 % IV BOLUS (SEPSIS)
1000.0000 mL | Freq: Once | INTRAVENOUS | Status: AC
  Administered 2017-03-09: 1000 mL via INTRAVENOUS

## 2017-03-09 NOTE — Progress Notes (Signed)
Patient arrived to unit on insuline drip running at 5.7,  D5 1/2 @125ml , NS 125. Patient is Alert and Oriented X3, VS B/P143/99, HR 98, RR24, O2 100% RA. Patient is resting in bed.

## 2017-03-09 NOTE — H&P (Signed)
[facility] History and Physical  [name] [name] XLK:[id number] DOB: 1986/02/12 DOA: 03/09/2017  Referring physician: *EDB  PCP: [name], MD   Chief Complaint: intractable nausea abdominal pain  HPI:  31 year old male with a history of type 1 diabetes, 4 prior admissions  for DKA, who presents to the ED , for nausea vomiting, elevated sugars at home for the last 24 hours. Patient was noted to be pale diaphoretic and vomiting when he presented. Patient states that he has been compliant with his insulin regimen and has not missed any doses. He denies any fever chills rigors or intercurrent illness.  ED course BP 131/77   Pulse (!) 110   Temp (!) 97.4 F (36.3 C) (Oral)   Resp 16   Ht 1.676 m ('5\' 6"'$ )   Wt 54.4 kg (120 lb)   SpO2 99%   BMI 19.37 kg/m  Accu-Check 378, VBG pH of 7.33, PCO2 28, PO2 81,anion gap 23, bicarbonate 16, UA negative,UDS positive for marijuana     Review of Systems: negative for the following  Constitutional: Denies fever, chills, diaphoresis, appetite change and fatigue.  HEENT: Denies photophobia, eye pain, redness, hearing loss, ear pain, congestion, sore throat, rhinorrhea, sneezing, mouth sores, trouble swallowing, neck pain, neck stiffness and tinnitus.  Respiratory: Denies SOB, DOE, cough, chest tightness, and wheezing.  Cardiovascular: Denies chest pain, palpitations and leg swelling.  Gastrointestinal:positive for nausea, vomiting, abdominal pain, diarrhea, denies constipation, blood in stool and abdominal distention.  Genitourinary: Denies dysuria, urgency, frequency, hematuria, flank pain and difficulty urinating.  Musculoskeletal: Denies myalgias, back pain, joint swelling, arthralgias and gait problem.  Skin: Denies pallor, rash and wound.  Neurological: Denies dizziness, seizures, syncope, weakness, light-headedness, numbness and headaches.  Hematological: Denies adenopathy. Easy bruising, personal or family bleeding history   Psychiatric/Behavioral: Denies suicidal ideation, mood changes, confusion, nervousness, sleep disturbance and agitation       Past Medical History:  Diagnosis Date  . Diabetes mellitus without complication ([place]) Dx 3664  . Renal insufficiency 2011   pt was on dialysis for a short time approx 2011, pt is no longer on dialysis     History reviewed. No pertinent surgical history.    Social History:  reports that he quit smoking about 3 years ago. He has never used smokeless tobacco. He reports that he does not drink alcohol or use drugs.    No Known Allergies  Family History  Problem Relation Age of Onset  . Diabetes Father   . Diabetes Maternal Grandmother   . Heart disease Neg Hx   . Hypertension Neg Hx         Prior to Admission medications   Medication Sig Start Date End Date Taking? Authorizing Provider  insulin aspart (NOVOLOG) 100 UNIT/ML injection Inject 15 Units into the skin 3 (three) times daily before meals.   Yes [provider]  Insulin Glargine (LANTUS) 100 UNIT/ML Solostar Pen Inject 40 Units into the skin daily at 10 pm. Patient taking differently: Inject 35 Units into the skin daily at 10 pm.  12/27/16  Yes Funches, Josalyn, MD  Blood Glucose Monitoring Suppl (TRUE METRIX METER) w/Device KIT 1 each by Does not apply route as needed. Patient not taking: Reported on 03/09/2017 07/12/16   Florencia Reasons, MD  insulin lispro (HUMALOG) 100 UNIT/ML KiwkPen Inject 0.15 mLs (15 Units total) into the skin 3 (three) times daily. For PASS Patient not taking: Reported on 12/08/2016 08/30/16   [name], MD     Physical Exam:  Vitals:   03/09/17 1000 03/09/17 1045 03/09/17 1115 03/09/17 1145  BP: 135/84 131/77  137/90  Pulse: (!) 111 (!) 108 (!) 110 (!) 112  Resp: '17 15 16 18  '$ Temp:      TempSrc:      SpO2: 100% 99% 99% 100%  Weight:      Height:            Vitals:   03/09/17 1000 03/09/17 1045 03/09/17 1115 03/09/17 1145  BP: 135/84 131/77   137/90  Pulse: (!) 111 (!) 108 (!) 110 (!) 112  Resp: '17 15 16 18  '$ Temp:      TempSrc:      SpO2: 100% 99% 99% 100%  Weight:      Height:       Constitutional: NAD, calm, comfortable Eyes: PERRL, lids and conjunctivae normal ENMT: Mucous membranes are moist. Posterior pharynx clear of any exudate or lesions.Normal dentition.  Neck: normal, supple, no masses, no thyromegaly Respiratory: clear to auscultation bilaterally, no wheezing, no crackles. Normal respiratory effort. No accessory muscle use.  Cardiovascular: Regular rate and rhythm, no murmurs / rubs / gallops. No extremity edema. 2+ pedal pulses. No carotid bruits.  Abdomen: no tenderness, no masses palpated. No hepatosplenomegaly. Bowel sounds positive.  Musculoskeletal: no clubbing / cyanosis. No joint deformity upper and lower extremities. Good ROM, no contractures. Normal muscle tone.  Skin: no rashes, lesions, ulcers. No induration Neurologic: CN 2-12 grossly intact. Sensation intact, DTR normal. Strength 5/5 in all 4.  Psychiatric: Normal judgment and insight. Alert and oriented x 3. Normal mood.     Labs on Admission: I have personally reviewed following labs and imaging studies  CBC:  Recent Labs Lab 03/09/17 0810 03/09/17 0925  WBC 9.5 10.1  NEUTROABS  --  9.1*  HGB 15.2 15.8  HCT 43.2 44.1  MCV 84.9 84.2  PLT 286 PLATELET CLUMPS NOTED ON SMEAR, UNABLE TO ESTIMATE    Basic Metabolic Panel:  Recent Labs Lab 03/09/17 0810  NA 135  K 4.2  CL 96*  CO2 16*  GLUCOSE 353*  BUN 12  CREATININE 1.20  CALCIUM 9.7    GFR: Estimated Creatinine Clearance: 67.4 mL/min (by C-G formula based on SCr of 1.2 mg/dL).  Liver Function Tests:  Recent Labs Lab 03/09/17 0915  AST 23  ALT 24  ALKPHOS 74  BILITOT 1.4*  PROT 8.0  ALBUMIN 4.7    Recent Labs Lab 03/09/17 0925  LIPASE 18   No results for input(s): AMMONIA in the last 168 hours.  Coagulation Profile: No results for input(s): INR, PROTIME in  the last 168 hours. No results for input(s): DDIMER in the last 72 hours.  Cardiac Enzymes: No results for input(s): CKTOTAL, CKMB, CKMBINDEX, TROPONINI in the last 168 hours.  BNP (last 3 results) No results for input(s): PROBNP in the last 8760 hours.  HbA1C: No results for input(s): HGBA1C in the last 72 hours. Lab Results  Component Value Date   HGBA1C 15.0 (H) 08/26/2016   HGBA1C 14.1 (H) 05/30/2016   HGBA1C 13.9 (H) 03/10/2016     CBG:  Recent Labs Lab 03/09/17 0803  GLUCAP 378*    Lipid Profile: No results for input(s): CHOL, HDL, LDLCALC, TRIG, CHOLHDL, LDLDIRECT in the last 72 hours.  Thyroid Function Tests: No results for input(s): TSH, T4TOTAL, FREET4, T3FREE, THYROIDAB in the last 72 hours.  Anemia Panel: No results for input(s): VITAMINB12, FOLATE, FERRITIN, TIBC, IRON, RETICCTPCT in the last 72 hours.  Urine  analysis:    Component Value Date/Time   COLORURINE STRAW (A) 03/09/2017 1013   APPEARANCEUR CLEAR 03/09/2017 1013   LABSPEC 1.025 03/09/2017 1013   PHURINE 5.0 03/09/2017 1013   GLUCOSEU >=500 (A) 03/09/2017 1013   HGBUR NEGATIVE 03/09/2017 1013   BILIRUBINUR NEGATIVE 03/09/2017 1013   BILIRUBINUR Negative 02/07/2016 1528   KETONESUR 80 (A) 03/09/2017 1013   PROTEINUR 30 (A) 03/09/2017 1013   UROBILINOGEN 0.2 02/07/2016 1528   UROBILINOGEN 0.2 03/22/2015 0913   NITRITE NEGATIVE 03/09/2017 1013   LEUKOCYTESUR NEGATIVE 03/09/2017 1013    Sepsis Labs: '@LABRCNTIP'$ (procalcitonin:4,lacticidven:4) )No results found for this or any previous visit (from the past 240 hour(s)).       Radiological Exams on Admission: No results found. No results found.    EKG: Independently reviewed.    Assessment/Plan Principal Problem:   Diabetic ketoacidosis without coma associated with type 1 diabetes mellitus ([place]) Patient admitted to telemetry per DKA protocol Will start patient on insulin drip, until DKA resolves  Serial BMP Nothing by  mouth IV fluids Transition to subcutaneous insulin once DKA resolved  Check hemoglobin A1c, last hemoglobin A1c was 15 on 08/26/16  Intractable nausea vomiting likely secondary to marijuana use, counseling done against THC use  Cannot rule out gastroparesis    DVT prophylaxis: lovenox     Code Status Orders full code         Start     Ordered   03/09/17 1157  Full code  Continuous     03/09/17 1158    Code Status History      consults called: none  Family Communication: Admission, patients condition and plan of care including tests being ordered have been discussed with the patient  who indicates understanding and agree with the plan and Code Status  Admission status:inpatient   Disposition plan: Further plan will depend as patient's clinical course evolves and further radiologic and laboratory data become available. Likely home when stable   At the time of admission, it appears that the appropriate admission status for this patient is INPATIENT .Thisis judged to be reasonable and necessary in order to provide the required intensity of service to ensure the patient's safetygiven thepresenting symptoms, physical exam findings, and initial radiographic and laboratory data in the context of their chronic comorbidities.   Reyne Dumas MD [facility] Pager [phone]  If 7PM-7AM, please contact night-coverage [email or web address] Password [email or web address]  03/09/2017, 12:15 PM

## 2017-03-09 NOTE — ED Notes (Signed)
Pt aware of need for urine  

## 2017-03-09 NOTE — ED Triage Notes (Signed)
Pt. Began having n/v yesterday with high blood sugars.  Pt. Has a hx of DKA.  Pt. Is pale and weak from vomiting. Labs and Zofran given in Triage.  Pt. Is alert and oriented X4

## 2017-03-09 NOTE — ED Provider Notes (Signed)
[facility] EMERGENCY DEPARTMENT Provider Note   CSN: [id number] Arrival date & time: 03/09/17  6160     History   Chief Complaint Chief Complaint  Patient presents with  . Hyperglycemia  . Nausea    HPI [name] [name] is a 31 y.o. male.  HPI  31 year old male insulin-dependent diabetes presents today with nausea and vomiting that began last night. He describes walking vomiting up to 6-8 times. It was food followed by yellowish fluid he denies headache, head injury, chest pain, dyspnea, abdominal pain, or change in urinary output. He reports taking his insulin as prescribed. He states this may have started with some alcohol intake the other night. He denies drinking alcohol on a regular basis  Past Medical History:  Diagnosis Date  . Diabetes mellitus without complication () Dx 7371  . Renal insufficiency 2011   pt was on dialysis for a short time approx 2011, pt is no longer on dialysis    Patient Active Problem List   Diagnosis Date Noted  . Hypophosphatemia 08/27/2016  . AKI (acute kidney injury) ([place]) 07/11/2016  . Hyperkalemia 03/09/2016  . Renal insufficiency 03/09/2016  . Leukocytosis 03/09/2016  . Diabetic ketoacidosis without coma associated with type 1 diabetes mellitus ([place])   . Non-intractable vomiting with nausea   . Chest pain 12/18/2015  . Marijuana use 07/22/2015  . Proteinuria with type 1 diabetes mellitus ([place]) 07/22/2015  . High blood pressure 07/22/2015  . Uncontrolled type 1 diabetes mellitus with ketoacidosis without coma ([place])   . Hypocalcemia   . Acute renal failure ([place])   . Hypokalemia   . Hypomagnesemia   . Abnormal EKG 06/15/2015  . Sinus tachycardia   . Tachypnea   . Tinea pedis 12/17/2014  . Adjustment disorder with mixed anxiety and depressed mood 09/03/2014  . Protein-calorie malnutrition, severe ([place]) 07/06/2014  . DM (diabetes mellitus), type 1, uncontrolled ([place]) 05/01/2014  . HLD (hyperlipidemia)  04/26/2014  . Substance abuse ([place])   . Dehydration 07/07/2013  . Fatigue 03/10/2012    History reviewed. No pertinent surgical history.     Home Medications    Prior to Admission medications   Medication Sig Start Date End Date Taking? Authorizing Provider  insulin aspart (NOVOLOG) 100 UNIT/ML injection Inject 15 Units into the skin 3 (three) times daily before meals.   Yes [provider]  Insulin Glargine (LANTUS) 100 UNIT/ML Solostar Pen Inject 40 Units into the skin daily at 10 pm. Patient taking differently: Inject 35 Units into the skin daily at 10 pm.  12/27/16  Yes Funches, Josalyn, MD  Blood Glucose Monitoring Suppl (TRUE METRIX METER) w/Device KIT 1 each by Does not apply route as needed. Patient not taking: Reported on 03/09/2017 07/12/16   Florencia Reasons, MD  insulin lispro (HUMALOG) 100 UNIT/ML KiwkPen Inject 0.15 mLs (15 Units total) into the skin 3 (three) times daily. For PASS Patient not taking: Reported on 12/08/2016 08/30/16   [name], MD    Family History Family History  Problem Relation Age of Onset  . Diabetes Father   . Diabetes Maternal Grandmother   . Heart disease Neg Hx   . Hypertension Neg Hx     Social History Social History  Substance Use Topics  . Smoking status: Former Smoker    Quit date: 09/19/2013  . Smokeless tobacco: Never Used  . Alcohol use No     Allergies   Patient has no known allergies.   Review of Systems Review of Systems  All other systems reviewed and are negative.    Physical Exam Updated Vital Signs BP 131/77   Pulse (!) 110   Temp (!) 97.4 F (36.3 C) (Oral)   Resp 16   Ht 1.676 m (_0 )   Wt 54.4 kg (120 lb)   SpO2 99%   BMI 19.37 kg/m   Physical Exam  Constitutional: He is oriented to person, place, and time. He appears well-developed and well-nourished.  HENT:  Head: Normocephalic.  Right Ear: External ear normal.  Left Ear: External ear normal.  Mucous membranes are somewhat dry    Eyes: Pupils are equal, round, and reactive to light.  Neck: Normal range of motion.  Cardiovascular: Tachycardia present.   Pulmonary/Chest: Effort normal and breath sounds normal.  Abdominal: Soft. Bowel sounds are normal.  Musculoskeletal: Normal range of motion.  Neurological: He is alert and oriented to person, place, and time. He displays normal reflexes. No cranial nerve deficit. Coordination normal.  Skin: Skin is warm. Capillary refill takes less than 2 seconds.  Psychiatric: He has a normal mood and affect.  Nursing note and vitals reviewed.    ED Treatments / Results  Labs (all labs ordered are listed, but only abnormal results are displayed) Labs Reviewed  BASIC METABOLIC PANEL - Abnormal; Notable for the following:       Result Value   Chloride 96 (*)    CO2 16 (*)    Glucose, Bld 353 (*)    Anion gap 23 (*)    All other components within normal limits  URINALYSIS, ROUTINE W REFLEX MICROSCOPIC - Abnormal; Notable for the following:    Color, Urine STRAW (*)    Glucose, UA >=500 (*)    Ketones, ur 80 (*)    Protein, ur 30 (*)    Squamous Epithelial / LPF 0-5 (*)    All other components within normal limits  CBC WITH DIFFERENTIAL/PLATELET - Abnormal; Notable for the following:    Neutro Abs 9.1 (*)    All other components within normal limits  RAPID URINE DRUG SCREEN, HOSP PERFORMED - Abnormal; Notable for the following:    Tetrahydrocannabinol POSITIVE (*)    All other components within normal limits  HEPATIC FUNCTION PANEL - Abnormal; Notable for the following:    Total Bilirubin 1.4 (*)    Indirect Bilirubin 1.2 (*)    All other components within normal limits  CBG MONITORING, ED - Abnormal; Notable for the following:    Glucose-Capillary 378 (*)    All other components within normal limits  I-STAT VENOUS BLOOD GAS, ED - Abnormal; Notable for the following:    pCO2, Ven 28.0 (*)    pO2, Ven 81.0 (*)    Bicarbonate 14.8 (*)    TCO2 16 (*)    Acid-base  deficit 9.0 (*)    All other components within normal limits  CBC  LIPASE, BLOOD  ETHANOL  BLOOD GAS, VENOUS  CBG MONITORING, ED    EKG  EKG Interpretation None       Radiology No results found.  Procedures Procedures (including critical care time)  Medications Ordered in ED Medications  ondansetron (ZOFRAN-ODT) disintegrating tablet 4 mg (not administered)  insulin regular (NOVOLIN R,HUMULIN R) 100 Units in sodium chloride 0.9 % 100 mL (1 Units/mL) infusion (not administered)  sodium chloride 0.9 % bolus 1,000 mL (0 mLs Intravenous Stopped 03/09/17 1042)  ondansetron (ZOFRAN) injection 4 mg (4 mg Intravenous Given 03/09/17 0942)     Initial Impression /  Assessment and Plan / ED Course  I have reviewed the triage vital signs and the nursing notes.  Pertinent labs & imaging results that were available during my care of the patient were reviewed by me and considered in my medical decision making (see chart for details).     31 year old male presents with mild DKA. His received a liter normal saline and being placed on glucose stabilizer. I discussed the patient's care with Dr. Allyson Sabal on for hospitalist and they will admit  Final Clinical Impressions(s) / ED Diagnoses   Final diagnoses:  Diabetic ketoacidosis without coma associated with type 1 diabetes mellitus ([place])    New Prescriptions New Prescriptions   No medications on file     Pattricia Boss, MD 03/09/17 1146

## 2017-03-09 NOTE — ED Notes (Signed)
Attempted IV x2. 

## 2017-03-10 DIAGNOSIS — R112 Nausea with vomiting, unspecified: Secondary | ICD-10-CM

## 2017-03-10 DIAGNOSIS — D72829 Elevated white blood cell count, unspecified: Secondary | ICD-10-CM

## 2017-03-10 DIAGNOSIS — E876 Hypokalemia: Secondary | ICD-10-CM

## 2017-03-10 LAB — COMPREHENSIVE METABOLIC PANEL
ALT: 16 U/L — AB (ref 17–63)
AST: 15 U/L (ref 15–41)
Albumin: 3.5 g/dL (ref 3.5–5.0)
Alkaline Phosphatase: 45 U/L (ref 38–126)
Anion gap: 7 (ref 5–15)
BILIRUBIN TOTAL: 0.8 mg/dL (ref 0.3–1.2)
BUN: 13 mg/dL (ref 6–20)
CHLORIDE: 109 mmol/L (ref 101–111)
CO2: 19 mmol/L — ABNORMAL LOW (ref 22–32)
CREATININE: 0.65 mg/dL (ref 0.61–1.24)
Calcium: 7.9 mg/dL — ABNORMAL LOW (ref 8.9–10.3)
GFR calc Af Amer: >60 mL/min (ref >60)
GLUCOSE: 96 mg/dL (ref 65–99)
Potassium: 3.1 mmol/L — ABNORMAL LOW (ref 3.5–5.1)
Sodium: 135 mmol/L (ref 135–145)
TOTAL PROTEIN: 5.9 g/dL — AB (ref 6.5–8.1)

## 2017-03-10 LAB — GLUCOSE, CAPILLARY
GLUCOSE-CAPILLARY: 119 mg/dL — AB (ref 65–99)
GLUCOSE-CAPILLARY: 141 mg/dL — AB (ref 65–99)
GLUCOSE-CAPILLARY: 159 mg/dL — AB (ref 65–99)
GLUCOSE-CAPILLARY: 178 mg/dL — AB (ref 65–99)
GLUCOSE-CAPILLARY: 88 mg/dL (ref 65–99)
GLUCOSE-CAPILLARY: 89 mg/dL (ref 65–99)
GLUCOSE-CAPILLARY: 90 mg/dL (ref 65–99)
Glucose-Capillary: 104 mg/dL — ABNORMAL HIGH (ref 65–99)
Glucose-Capillary: 107 mg/dL — ABNORMAL HIGH (ref 65–99)
Glucose-Capillary: 180 mg/dL — ABNORMAL HIGH (ref 65–99)
Glucose-Capillary: 192 mg/dL — ABNORMAL HIGH (ref 65–99)
Glucose-Capillary: 215 mg/dL — ABNORMAL HIGH (ref 65–99)
Glucose-Capillary: 157 mg/dL — ABNORMAL HIGH (ref 65–99)
Glucose-Capillary: 205 mg/dL — ABNORMAL HIGH (ref 65–99)
Glucose-Capillary: 144 mg/dL — ABNORMAL HIGH (ref 65–99)

## 2017-03-10 LAB — BASIC METABOLIC PANEL
ANION GAP: 8 (ref 5–15)
ANION GAP: 7 (ref 5–15)
Anion gap: 8 (ref 5–15)
Anion gap: 8 (ref 5–15)
Anion gap: 8 (ref 5–15)
BUN: 11 mg/dL (ref 6–20)
BUN: 13 mg/dL (ref 6–20)
BUN: 10 mg/dL (ref 6–20)
BUN: 10 mg/dL (ref 6–20)
BUN: 10 mg/dL (ref 6–20)
CALCIUM: 7.9 mg/dL — AB (ref 8.9–10.3)
CALCIUM: 7.9 mg/dL — AB (ref 8.9–10.3)
CHLORIDE: 104 mmol/L (ref 101–111)
CHLORIDE: 104 mmol/L (ref 101–111)
CHLORIDE: 101 mmol/L (ref 101–111)
CO2: 19 mmol/L — ABNORMAL LOW (ref 22–32)
CO2: 20 mmol/L — AB (ref 22–32)
CO2: 20 mmol/L — AB (ref 22–32)
CO2: 21 mmol/L — AB (ref 22–32)
CO2: 19 mmol/L — AB (ref 22–32)
CREATININE: 0.62 mg/dL (ref 0.61–1.24)
CREATININE: 0.58 mg/dL — AB (ref 0.61–1.24)
CREATININE: 0.54 mg/dL — AB (ref 0.61–1.24)
Calcium: 8 mg/dL — ABNORMAL LOW (ref 8.9–10.3)
Calcium: 8 mg/dL — ABNORMAL LOW (ref 8.9–10.3)
Calcium: 8 mg/dL — ABNORMAL LOW (ref 8.9–10.3)
Chloride: 109 mmol/L (ref 101–111)
Chloride: 107 mmol/L (ref 101–111)
Creatinine, Ser: 0.65 mg/dL (ref 0.61–1.24)
Creatinine, Ser: 0.69 mg/dL (ref 0.61–1.24)
GFR calc Af Amer: >60 mL/min (ref >60)
GFR calc Af Amer: >60 mL/min (ref >60)
GFR calc non Af Amer: >60 mL/min (ref >60)
GFR calc non Af Amer: >60 mL/min (ref >60)
GFR calc non Af Amer: >60 mL/min (ref >60)
GLUCOSE: 87 mg/dL (ref 65–99)
Glucose, Bld: 168 mg/dL — ABNORMAL HIGH (ref 65–99)
Glucose, Bld: 164 mg/dL — ABNORMAL HIGH (ref 65–99)
Glucose, Bld: 156 mg/dL — ABNORMAL HIGH (ref 65–99)
Glucose, Bld: 114 mg/dL — ABNORMAL HIGH (ref 65–99)
POTASSIUM: 3.2 mmol/L — AB (ref 3.5–5.1)
POTASSIUM: 3.4 mmol/L — AB (ref 3.5–5.1)
POTASSIUM: 3.1 mmol/L — AB (ref 3.5–5.1)
Potassium: 3.5 mmol/L (ref 3.5–5.1)
Potassium: 3.3 mmol/L — ABNORMAL LOW (ref 3.5–5.1)
SODIUM: 136 mmol/L (ref 135–145)
SODIUM: 133 mmol/L — AB (ref 135–145)
Sodium: 133 mmol/L — ABNORMAL LOW (ref 135–145)
Sodium: 132 mmol/L — ABNORMAL LOW (ref 135–145)
Sodium: 129 mmol/L — ABNORMAL LOW (ref 135–145)

## 2017-03-10 LAB — CBC WITH DIFFERENTIAL/PLATELET
BASOS ABS: 0 10*3/uL (ref 0.0–0.1)
Basophils Relative: 0 %
Eosinophils Absolute: 0 10*3/uL (ref 0.0–0.7)
Eosinophils Relative: 0 %
HEMATOCRIT: 35.8 % — AB (ref 39.0–52.0)
Hemoglobin: 12.6 g/dL — ABNORMAL LOW (ref 13.0–17.0)
LYMPHS PCT: 26 %
Lymphs Abs: 2.4 10*3/uL (ref 0.7–4.0)
MCH: 29.9 pg (ref 26.0–34.0)
MCHC: 35.2 g/dL (ref 30.0–36.0)
MCV: 84.8 fL (ref 78.0–100.0)
Monocytes Absolute: 0.8 10*3/uL (ref 0.1–1.0)
Monocytes Relative: 9 %
NEUTROS ABS: 5.9 10*3/uL (ref 1.7–7.7)
NEUTROS PCT: 65 %
Platelets: 213 10*3/uL (ref 150–400)
RBC: 4.22 MIL/uL (ref 4.22–5.81)
RDW: 12.4 % (ref 11.5–15.5)
WBC: 9.2 10*3/uL (ref 4.0–10.5)

## 2017-03-10 MED ORDER — INSULIN ASPART 100 UNIT/ML ~~LOC~~ SOLN: 15.0000 [IU] | Freq: Three times a day (TID) | SUBCUTANEOUS | Status: DC

## 2017-03-10 MED ORDER — ONDANSETRON HCL 4 MG/2ML IJ SOLN
INTRAMUSCULAR | Status: AC
  Administered 2017-03-10: 4 mg
  Filled 2017-03-10: qty 2

## 2017-03-10 MED ORDER — INSULIN ASPART 100 UNIT/ML ~~LOC~~ SOLN: 0.0000 [IU] | Freq: Every day | SUBCUTANEOUS | Status: DC

## 2017-03-10 MED ORDER — ONDANSETRON HCL 4 MG/2ML IJ SOLN
4.0000 mg | Freq: Four times a day (QID) | INTRAMUSCULAR | Status: DC | PRN
  Administered 2017-03-10 – 2017-03-11 (×2): 4 mg via INTRAVENOUS
  Filled 2017-03-10 (×2): qty 2

## 2017-03-10 MED ORDER — BOOST / RESOURCE BREEZE PO LIQD
1.0000 | Freq: Three times a day (TID) | ORAL | Status: DC
  Administered 2017-03-11 – 2017-03-12 (×5): 1 via ORAL

## 2017-03-10 MED ORDER — POTASSIUM CHLORIDE CRYS ER 20 MEQ PO TBCR
40.0000 meq | EXTENDED_RELEASE_TABLET | Freq: Once | ORAL | Status: AC
  Administered 2017-03-10: 40 meq via ORAL
  Filled 2017-03-10: qty 2

## 2017-03-10 MED ORDER — INSULIN GLARGINE 100 UNIT/ML ~~LOC~~ SOLN
35.0000 [IU] | Freq: Every day | SUBCUTANEOUS | Status: DC
  Administered 2017-03-10: 35 [IU] via SUBCUTANEOUS
  Filled 2017-03-10 (×3): qty 0.35

## 2017-03-10 MED ORDER — INSULIN ASPART 100 UNIT/ML ~~LOC~~ SOLN
0.0000 [IU] | Freq: Three times a day (TID) | SUBCUTANEOUS | Status: DC
  Administered 2017-03-11: 3 [IU] via SUBCUTANEOUS
  Administered 2017-03-11: 1 [IU] via SUBCUTANEOUS
  Administered 2017-03-12: 5 [IU] via SUBCUTANEOUS
  Administered 2017-03-12: 7 [IU] via SUBCUTANEOUS

## 2017-03-10 NOTE — Progress Notes (Signed)
Initial Nutrition Assessment  DOCUMENTATION CODES:   Not applicable  INTERVENTION:    Boost Breeze po TID, each supplement provides 250 kcal and 9 grams of protein  NUTRITION DIAGNOSIS:   Increased nutrient needs related to acute illness as evidenced by estimated needs  GOAL:   Patient will meet greater than or equal to 90% of their needs  MONITOR:   PO intake, Supplement acceptance, Labs, Weight trends, Skin, I & O's  REASON FOR ASSESSMENT:   Malnutrition Screening Tool  ASSESSMENT:   31 year old male with a history of type 1 diabetes, 4 prior admissions  for DKA, who presents to the ED , for nausea vomiting, elevated sugars at home for the last 24 hours. Patient was noted to be pale diaphoretic and vomiting when he presented. Patient states that he has been compliant with his insulin regimen and has not missed any doses. He denies any fever chills rigors or intercurrent illness.  Pt drowsy upon RD visit. States he's feeling nauseous. Clear Liquid tray untouched. Endorses a 10 lb weight loss, however, unable to quantify time frame.  Per readings below, pt's weight has fluctuated up and down. Likely due to uncontrolled DM. Labs and medications reviewed. Na 132 (L). K 3.4 (L). CBG's [id number].  Unable to complete Nutrition-Focused physical exam at this time.  Suspect possible malnutrition.  Diet Order:  Diet full liquid Room service appropriate? Yes; Fluid consistency: Thin  Skin:  Reviewed, no issues  Last BM:  10/19  Height:   Ht Readings from Last 1 Encounters:  03/09/17 5\' 6"  (1.676 m)    Weight:   Wt Readings from Last 1 Encounters:  03/10/17 120 lb 6.4 oz (54.6 kg)   Wt Readings from Last 15 Encounters:  03/10/17 120 lb 6.4 oz (54.6 kg)  12/08/16 117 lb (53.1 kg)  10/29/16 103 lb 13.4 oz (47.1 kg)  08/26/16 101 lb 13.6 oz (46.2 kg)  07/21/16 109 lb 9.6 oz (49.7 kg)  07/11/16 101 lb 13.6 oz (46.2 kg)  05/29/16 104 lb 15 oz (47.6 kg)  03/09/16  121 lb (54.9 kg)  02/07/16 118 lb (53.5 kg)  12/18/15 105 lb 13.1 oz (48 kg)  12/01/15 113 lb (51.3 kg)  08/03/15 121 lb 12.8 oz (55.2 kg)  07/27/15 118 lb 3.2 oz (53.6 kg)  07/24/15 115 lb 8.3 oz (52.4 kg)  07/06/15 118 lb (53.5 kg)   Ideal Body Weight:  64.5 kg  BMI:  Body mass index is 19.43 kg/m.  Estimated Nutritional Needs:   Kcal:  1800-2000  Protein:  90-105 gm  Fluid:  1.8-2.0 L  EDUCATION NEEDS:   No education needs identified at this time  Maureen ChattersKatie Kimia Finan, RD, LDN Pager #: [phone] After-Hours Pager #: [phone]

## 2017-03-10 NOTE — Progress Notes (Signed)
PROGRESS NOTE    [name] [name]  ZOX:[id number] DOB: Apr 18, 1986 DOA: 03/09/2017 PCP: Dessa Phi, MD   Chief Complaint  Patient presents with  . Hyperglycemia  . Nausea    Brief Narrative:  HPI On 03/09/2017 by Dr. Richarda Overlie 31 year old male with a history of type 1 diabetes, 4 prior admissions  for DKA, who presents to the ED , for nausea vomiting, elevated sugars at home for the last 24 hours. Patient was noted to be pale diaphoretic and vomiting when he presented. Patient states that he has been compliant with his insulin regimen and has not missed any doses. He denies any fever chills rigors or intercurrent illness. Assessment & Plan   Diabetic ketoacidosis/uncontrolled diabetes mellitus, type I -Patient placed on insulin drip -Though CBGs have improved and gap, will continue insulin drip as CO2 levels continue to be mildly low. -Once CO2 levels above 20, will transition off of insulin drip and resume Lantus with insulin sliding scale -Discussed glucose control with patient, states his sugars have been running in the 250s and upward.  Recently drank some alcohol, since then sugars have been uncontrolled.  States he does not take more than 35 units of Lantus at night as he runs out of the medication. -UA unremarkable for infection -Hemoglobin A1c 12.1 -Will place patient on clear liquid diet -Continue to monitor CBGs as well as BMP  Intractable nausea vomiting -Likely due to DKA -Improving -Will place on clear liquid diet and monitor -Continue antiemetics as needed  Hypokalemia -Resolved, continue to monitor BMP  Leukocytosis -Likely secondary to DKA, resolved -No source of infection, UA unremarkable, patient denies any cough or shortness of breath.  Currently afebrile -continue to monitor CBC  Marijuana abuse  -UDS positive for THC -Counseled  DVT Prophylaxis Lovenox  Code Status: Full  Family Communication: None at bedside  Disposition Plan: Admitted,  continue to monitor in stepdown.  Home when stable  Consultants None  Procedures  None  Antibiotics   Anti-infectives    None      Subjective:   Lelon Mast seen and examined today.  Feels nausea vomiting have improved.  Denies chest pain, shortness of breath, abdominal pain, dizziness or headache.   Objective:   Vitals:   03/09/17 1932 03/09/17 2000 03/10/17 0000 03/10/17 0409  BP: (!) 129/93 (!) 107/96 110/73 (!) 144/97  Pulse: (!) 106 (!) 102 (!) 103 91  Resp: (!) 21 17 14 13   Temp: 98.6 F (37 C)  98.8 F (37.1 C) 98.2 F (36.8 C)  TempSrc: Oral  Oral Oral  SpO2: 100% 100% 99% 100%  Weight:    54.6 kg (120 lb 6.4 oz)  Height:        Intake/Output Summary (Last 24 hours) at 03/10/17 1110 Last data filed at 03/10/17 0300  Gross per 24 hour  Intake          3259.35 ml  Output                0 ml  Net          3259.35 ml   Filed Weights   03/09/17 0757 03/10/17 0409  Weight: 54.4 kg (120 lb) 54.6 kg (120 lb 6.4 oz)    Exam  General: Well developed, well nourished, NAD, appears stated age  HEENT: NCAT, mucous membranes moist.   Cardiovascular: S1 S2 auscultated, no murmurs, tachycardia  Respiratory: Clear to auscultation bilaterally with equal chest rise  Abdomen: Soft, nontender, nondistended, + bowel sounds  Extremities: warm dry without cyanosis clubbing or edema  Neuro: AAOx3, nonfocal  Psych: Normal affect and demeanor with intact judgement and insight   Data Reviewed: I have personally reviewed following labs and imaging studies  CBC:  Recent Labs Lab 03/09/17 0810 03/09/17 0925 03/09/17 1207 03/10/17 0330  WBC 9.5 10.1 13.3* 9.2  NEUTROABS  --  9.1*  --  5.9  HGB 15.2 15.8 15.0 12.6*  HCT 43.2 44.1 42.1 35.8*  MCV 84.9 84.2 85.6 84.8  PLT 286 PLATELET CLUMPS NOTED ON SMEAR, UNABLE TO ESTIMATE 264 213   Basic Metabolic Panel:  Recent Labs Lab 03/09/17 1207 03/09/17 1612 03/09/17 2001 03/09/17 2345 03/10/17 0330  03/10/17 0710  NA 134* 138 135 136 135 133*  K 4.9 4.3 3.5 3.1* 3.1* 3.5  CL 102 113* 109 109 109 107  CO2 12* 15* 17* 19* 19* 19*  GLUCOSE 433* 197* 185* 87 96 168*  BUN 13 13 12 11 13 13   CREATININE 1.20 0.99 0.84 0.65 0.65 0.69  CALCIUM 9.3 8.2* 8.2* 8.0* 7.9* 7.9*  MG 1.8  --   --   --   --   --    GFR: Estimated Creatinine Clearance: 101.4 mL/min (by C-G formula based on SCr of 0.69 mg/dL). Liver Function Tests:  Recent Labs Lab 03/09/17 0915 03/10/17 0330  AST 23 15  ALT 24 16*  ALKPHOS 74 45  BILITOT 1.4* 0.8  PROT 8.0 5.9*  ALBUMIN 4.7 3.5    Recent Labs Lab 03/09/17 0925  LIPASE 18   No results for input(s): AMMONIA in the last 168 hours. Coagulation Profile: No results for input(s): INR, PROTIME in the last 168 hours. Cardiac Enzymes: No results for input(s): CKTOTAL, CKMB, CKMBINDEX, TROPONINI in the last 168 hours. BNP (last 3 results) No results for input(s): PROBNP in the last 8760 hours. HbA1C:  Recent Labs  03/09/17 1207  HGBA1C 12.1*   CBG:  Recent Labs Lab 03/10/17 0438 03/10/17 0544 03/10/17 0738 03/10/17 0846 03/10/17 1009  GLUCAP 107* 141* 180* 192* 215*   Lipid Profile:  Recent Labs  03/09/17 1207  CHOL 197  HDL 63  LDLCALC 98  TRIG 180*  CHOLHDL 3.1   Thyroid Function Tests: No results for input(s): TSH, T4TOTAL, FREET4, T3FREE, THYROIDAB in the last 72 hours. Anemia Panel: No results for input(s): VITAMINB12, FOLATE, FERRITIN, TIBC, IRON, RETICCTPCT in the last 72 hours. Urine analysis:    Component Value Date/Time   COLORURINE STRAW (A) 03/09/2017 1013   APPEARANCEUR CLEAR 03/09/2017 1013   LABSPEC 1.025 03/09/2017 1013   PHURINE 5.0 03/09/2017 1013   GLUCOSEU >=500 (A) 03/09/2017 1013   HGBUR NEGATIVE 03/09/2017 1013   BILIRUBINUR NEGATIVE 03/09/2017 1013   BILIRUBINUR Negative 02/07/2016 1528   KETONESUR 80 (A) 03/09/2017 1013   PROTEINUR 30 (A) 03/09/2017 1013   UROBILINOGEN 0.2 02/07/2016 1528    UROBILINOGEN 0.2 03/22/2015 0913   NITRITE NEGATIVE 03/09/2017 1013   LEUKOCYTESUR NEGATIVE 03/09/2017 1013   Sepsis Labs: @LABRCNTIP (procalcitonin:4,lacticidven:4)  )No results found for this or any previous visit (from the past 240 hour(s)).    Radiology Studies: No results found.   Scheduled Meds: . enoxaparin (LOVENOX) injection  40 mg Subcutaneous Q24H   Continuous Infusions: . sodium chloride Stopped (03/09/17 1844)  . dextrose 5 % and 0.45% NaCl 125 mL/hr at 03/10/17 1016  . insulin (NOVOLIN-R) infusion 3.1 Units/hr (03/10/17 1017)     LOS: 1 day   Time Spent in minutes   40 minutes  Selma Rodelo D.O. on 03/10/2017 at 11:10 AM  Between 7am to 7pm - Pager - [phone]  After 7pm go to [email or web address] - password [email or web address]  And look for the night coverage person covering for me after hours  [facility] Office  [phone]

## 2017-03-10 NOTE — Progress Notes (Signed)
Per Burnadette PeterLynch NP, will recheck K+ with 0800 labs as oral replacement was not able to be given until after lab draw

## 2017-03-10 NOTE — Progress Notes (Deleted)
Nutrition Brief Note  Patient identified on the Malnutrition Screening Tool (MST) Report.  Wt Readings from Last 15 Encounters:  03/10/17 120 lb 6.4 oz (54.6 kg)  12/08/16 117 lb (53.1 kg)  10/29/16 103 lb 13.4 oz (47.1 kg)  08/26/16 101 lb 13.6 oz (46.2 kg)  07/21/16 109 lb 9.6 oz (49.7 kg)  07/11/16 101 lb 13.6 oz (46.2 kg)  05/29/16 104 lb 15 oz (47.6 kg)  03/09/16 121 lb (54.9 kg)  02/07/16 118 lb (53.5 kg)  12/18/15 105 lb 13.1 oz (48 kg)  12/01/15 113 lb (51.3 kg)  08/03/15 121 lb 12.8 oz (55.2 kg)  07/27/15 118 lb 3.2 oz (53.6 kg)  07/24/15 115 lb 8.3 oz (52.4 kg)  07/06/15 118 lb (53.5 kg)   Body mass index is 19.43 kg/m. Patient meets criteria for Normal based on current BMI.   Current diet order is Clear Liquids. Labs and medications reviewed.   No nutrition interventions warranted at this time. If nutrition issues arise, please consult RD.   Maureen ChattersKatie Glennice Marcos, RD, LDN Pager #: [phone] After-Hours Pager #: [phone]

## 2017-03-10 NOTE — Plan of Care (Signed)
Problem: Activity: Goal: Risk for activity intolerance will decrease Outcome: Progressing Pt able to walk ad lib to the bathroom this pm  Problem: Fluid Volume: Goal: Ability to maintain a balanced intake and output will improve Outcome: Progressing Pt endorses less nausea this pm. Able to tolerate a Nepro shake and diet [place]

## 2017-03-11 DIAGNOSIS — E1010 Type 1 diabetes mellitus with ketoacidosis without coma: Principal | ICD-10-CM

## 2017-03-11 DIAGNOSIS — Z9114 Patient's other noncompliance with medication regimen: Secondary | ICD-10-CM

## 2017-03-11 DIAGNOSIS — F1910 Other psychoactive substance abuse, uncomplicated: Secondary | ICD-10-CM

## 2017-03-11 LAB — BASIC METABOLIC PANEL
Anion gap: 8 (ref 5–15)
Anion gap: 5 (ref 5–15)
BUN: 10 mg/dL (ref 6–20)
BUN: 8 mg/dL (ref 6–20)
CALCIUM: 8 mg/dL — AB (ref 8.9–10.3)
CALCIUM: 8.4 mg/dL — AB (ref 8.9–10.3)
CO2: 21 mmol/L — ABNORMAL LOW (ref 22–32)
CO2: 26 mmol/L (ref 22–32)
CREATININE: 0.59 mg/dL — AB (ref 0.61–1.24)
Chloride: 100 mmol/L — ABNORMAL LOW (ref 101–111)
Chloride: 102 mmol/L (ref 101–111)
Creatinine, Ser: 0.59 mg/dL — ABNORMAL LOW (ref 0.61–1.24)
GFR calc Af Amer: >60 mL/min (ref >60)
GFR calc Af Amer: >60 mL/min (ref >60)
GFR calc non Af Amer: >60 mL/min (ref >60)
GLUCOSE: 128 mg/dL — AB (ref 65–99)
Glucose, Bld: 203 mg/dL — ABNORMAL HIGH (ref 65–99)
POTASSIUM: 2.9 mmol/L — AB (ref 3.5–5.1)
Potassium: 3 mmol/L — ABNORMAL LOW (ref 3.5–5.1)
SODIUM: 129 mmol/L — AB (ref 135–145)
Sodium: 133 mmol/L — ABNORMAL LOW (ref 135–145)

## 2017-03-11 LAB — GLUCOSE, CAPILLARY
GLUCOSE-CAPILLARY: 123 mg/dL — AB (ref 65–99)
Glucose-Capillary: 241 mg/dL — ABNORMAL HIGH (ref 65–99)
Glucose-Capillary: 154 mg/dL — ABNORMAL HIGH (ref 65–99)
Glucose-Capillary: 124 mg/dL — ABNORMAL HIGH (ref 65–99)
Glucose-Capillary: 57 mg/dL — ABNORMAL LOW (ref 65–99)
Glucose-Capillary: 95 mg/dL (ref 65–99)

## 2017-03-11 LAB — CBC
HCT: 35.9 % — ABNORMAL LOW (ref 39.0–52.0)
Hemoglobin: 12.5 g/dL — ABNORMAL LOW (ref 13.0–17.0)
MCH: 29.2 pg (ref 26.0–34.0)
MCHC: 34.8 g/dL (ref 30.0–36.0)
MCV: 83.9 fL (ref 78.0–100.0)
PLATELETS: 198 10*3/uL (ref 150–400)
RBC: 4.28 MIL/uL (ref 4.22–5.81)
RDW: 12 % (ref 11.5–15.5)
WBC: 5.2 10*3/uL (ref 4.0–10.5)

## 2017-03-11 MED ORDER — METOCLOPRAMIDE HCL 5 MG/ML IJ SOLN
5.0000 mg | Freq: Four times a day (QID) | INTRAMUSCULAR | Status: DC | PRN
  Administered 2017-03-12: 5 mg via INTRAVENOUS
  Filled 2017-03-11: qty 2

## 2017-03-11 MED ORDER — INSULIN ASPART 100 UNIT/ML ~~LOC~~ SOLN
10.0000 [IU] | Freq: Three times a day (TID) | SUBCUTANEOUS | Status: DC
  Administered 2017-03-11: 10 [IU] via SUBCUTANEOUS

## 2017-03-11 MED ORDER — INSULIN ASPART 100 UNIT/ML ~~LOC~~ SOLN
5.0000 [IU] | Freq: Three times a day (TID) | SUBCUTANEOUS | Status: DC
  Administered 2017-03-11: 5 [IU] via SUBCUTANEOUS

## 2017-03-11 MED ORDER — POTASSIUM CHLORIDE CRYS ER 20 MEQ PO TBCR
40.0000 meq | EXTENDED_RELEASE_TABLET | ORAL | Status: AC
  Administered 2017-03-11 (×3): 40 meq via ORAL
  Filled 2017-03-11 (×3): qty 2

## 2017-03-11 MED ORDER — MAGNESIUM SULFATE 2 GM/50ML IV SOLN
2.0000 g | Freq: Once | INTRAVENOUS | Status: AC
  Administered 2017-03-11: 2 g via INTRAVENOUS
  Filled 2017-03-11: qty 50

## 2017-03-11 MED ORDER — INSULIN GLARGINE 100 UNIT/ML ~~LOC~~ SOLN
25.0000 [IU] | Freq: Every day | SUBCUTANEOUS | Status: DC
  Filled 2017-03-11 (×2): qty 0.25

## 2017-03-11 NOTE — Progress Notes (Signed)
pts 1200 bs was 57, 4 oz oj was given, md notified. Will ctm

## 2017-03-11 NOTE — Progress Notes (Signed)
PROGRESS NOTE    [name] [name]  ZOX:[id number] DOB: Apr 22, 1986 DOA: 03/09/2017 PCP: Dessa PhiFunches, Josalyn, MD   Chief Complaint  Patient presents with  . Hyperglycemia  . Nausea    Brief Narrative:  HPI On 03/09/2017 by Dr. Richarda OverlieNayana Abrol 31 year old male with a history of type 1 diabetes, 4 prior admissions  for DKA, who presents to the ED , for nausea vomiting, elevated sugars at home for the last 24 hours. Patient was noted to be pale diaphoretic and vomiting when he presented. Patient states that he has been compliant with his insulin regimen and has not missed any doses. He denies any fever chills rigors or intercurrent illness.   Assessment & Plan   Diabetic ketoacidosis/uncontrolled diabetes mellitus, type I -transitioned off insulin gtt -Recently drank some alcohol, since then sugars have been uncontrolled.  States he does not take more than 35 units of Lantus at night-- only has 25 units left at home- [facility] -UA unremarkable for infection -Hemoglobin A1c 12.1 so obviously non-compliant at home -advance diet  Intractable nausea vomiting -Likely due to DKA -Improving -advance diet -Continue antiemetics as needed- added reglan as poorly controlled DM and probably has a component of gastroparesis  Hypokalemia -repleted -recheck BMP this AM -will give Mg  Leukocytosis -Likely secondary to DKA, resolved -No source of infection, UA unremarkable, patient denies any cough or shortness of breath.  Currently afebrile -continue to monitor CBC  Marijuana abuse  -UDS positive for THC -Counseled  DVT Prophylaxis Lovenox  Code Status: Full  Family Communication: None at bedside  Disposition Plan: home in AM if tolerating PO-- [facility]  Consultants None  Procedures  None  Antibiotics   Anti-infectives    None      Subjective:   Able to tolerate liquids  so far, no fever, no CP, no SOB BM this AM  Objective:   Vitals:   03/11/17 0024 03/11/17 0409 03/11/17 0613 03/11/17 0758  BP: (!) 145/102 (!) 131/92  (!) 138/98  Pulse: 85   72  Resp: 14   20  Temp: 99.2 F (37.3 C) 99.2 F (37.3 C)  99.1 F (37.3 C)  TempSrc: Oral Oral  Oral  SpO2: 100% 95%  100%  Weight:   51.3 kg (113 lb 3.2 oz)   Height:        Intake/Output Summary (Last 24 hours) at 03/11/17 0813 Last data filed at 03/11/17 0758  Gross per 24 hour  Intake             99.2 ml  Output             1140 ml  Net          -1040.8 ml   Filed Weights   03/09/17 0757 03/10/17 0409 03/11/17 0613  Weight: 54.4 kg (120 lb) 54.6 kg (120 lb 6.4 oz) 51.3 kg (113 lb 3.2 oz)    Exam  General: NAD  Cardiovascular: rrr  Respiratory: clear no wheezing  Abdomen: epigastric tenderness, mild  Extremities: no edema  Neuro: A+Ox3  Psych: poor insight into disease   Data Reviewed: I have personally reviewed following labs and imaging studies  CBC:  Recent Labs Lab 03/09/17 0810 03/09/17 0925 03/09/17 1207 03/10/17 0330 03/11/17 0324  WBC 9.5 10.1 13.3* 9.2 5.2  NEUTROABS  --  9.1*  --  5.9  --   HGB 15.2  15.8 15.0 12.6* 12.5*  HCT 43.2 44.1 42.1 35.8* 35.9*  MCV 84.9 84.2 85.6 84.8 83.9  PLT 286 PLATELET CLUMPS NOTED ON SMEAR, UNABLE TO ESTIMATE 264 213 198   Basic Metabolic Panel:  Recent Labs Lab 03/09/17 1207  03/10/17 0710 03/10/17 1210 03/10/17 1539 03/10/17 1922 03/10/17 2315  NA 134*  < > 133* 133* 132* 129* 129*  K 4.9  < > 3.5 3.3* 3.4* 3.2* 3.0*  CL 102  < > 107 104 104 101 100*  CO2 12*  < > 19* 21* 20* 20* 21*  GLUCOSE 433*  < > 168* 164* 156* 114* 203*  BUN 13  < > 13 10 10 10 10   CREATININE 1.20  < > 0.69 0.62 0.58* 0.54* 0.59*  CALCIUM 9.3  < > 7.9* 8.0* 8.0* 7.9* 8.0*  MG 1.8  --   --   --   --   --   --   < > = values in this interval not displayed. GFR: Estimated Creatinine Clearance: 95.3 mL/min (A) (by C-G formula based on SCr  of 0.59 mg/dL (L)). Liver Function Tests:  Recent Labs Lab 03/09/17 0915 03/10/17 0330  AST 23 15  ALT 24 16*  ALKPHOS 74 45  BILITOT 1.4* 0.8  PROT 8.0 5.9*  ALBUMIN 4.7 3.5    Recent Labs Lab 03/09/17 0925  LIPASE 18   No results for input(s): AMMONIA in the last 168 hours. Coagulation Profile: No results for input(s): INR, PROTIME in the last 168 hours. Cardiac Enzymes: No results for input(s): CKTOTAL, CKMB, CKMBINDEX, TROPONINI in the last 168 hours. BNP (last 3 results) No results for input(s): PROBNP in the last 8760 hours. HbA1C:  Recent Labs  03/09/17 1207  HGBA1C 12.1*   CBG:  Recent Labs Lab 03/10/17 1457 03/10/17 1626 03/10/17 1818 03/11/17 0525 03/11/17 0800  GLUCAP 205* 144* 119* 241* 154*   Lipid Profile:  Recent Labs  03/09/17 1207  CHOL 197  HDL 63  LDLCALC 98  TRIG 180*  CHOLHDL 3.1   Thyroid Function Tests: No results for input(s): TSH, T4TOTAL, FREET4, T3FREE, THYROIDAB in the last 72 hours. Anemia Panel: No results for input(s): VITAMINB12, FOLATE, FERRITIN, TIBC, IRON, RETICCTPCT in the last 72 hours. Urine analysis:    Component Value Date/Time   COLORURINE STRAW (A) 03/09/2017 1013   APPEARANCEUR CLEAR 03/09/2017 1013   LABSPEC 1.025 03/09/2017 1013   PHURINE 5.0 03/09/2017 1013   GLUCOSEU >=500 (A) 03/09/2017 1013   HGBUR NEGATIVE 03/09/2017 1013   BILIRUBINUR NEGATIVE 03/09/2017 1013   BILIRUBINUR Negative 02/07/2016 1528   KETONESUR 80 (A) 03/09/2017 1013   PROTEINUR 30 (A) 03/09/2017 1013   UROBILINOGEN 0.2 02/07/2016 1528   UROBILINOGEN 0.2 03/22/2015 0913   NITRITE NEGATIVE 03/09/2017 1013   LEUKOCYTESUR NEGATIVE 03/09/2017 1013     )No results found for this or any previous visit (from the past 240 hour(s)).    Radiology Studies: No results found.   Scheduled Meds: . feeding supplement  1 Container Oral TID WC  . insulin aspart  0-5 Units Subcutaneous QHS  . insulin aspart  0-9 Units Subcutaneous  TID WC  . insulin aspart  10 Units Subcutaneous TID WC  . insulin glargine  35 Units Subcutaneous Q2200   Continuous Infusions:    LOS: 2 days   Time Spent in minutes   25 minutes  Lenox Bink U Thomasena Vandenheuvel D.O. on 03/11/2017 at 8:13 AM  [facility] Office  [phone]

## 2017-03-12 DIAGNOSIS — E785 Hyperlipidemia, unspecified: Secondary | ICD-10-CM

## 2017-03-12 LAB — BASIC METABOLIC PANEL
Anion gap: 8 (ref 5–15)
BUN: 6 mg/dL (ref 6–20)
CHLORIDE: 101 mmol/L (ref 101–111)
CO2: 24 mmol/L (ref 22–32)
Calcium: 8.5 mg/dL — ABNORMAL LOW (ref 8.9–10.3)
Creatinine, Ser: 0.57 mg/dL — ABNORMAL LOW (ref 0.61–1.24)
GFR calc Af Amer: >60 mL/min (ref >60)
GFR calc non Af Amer: >60 mL/min (ref >60)
GLUCOSE: 212 mg/dL — AB (ref 65–99)
Potassium: 4.6 mmol/L (ref 3.5–5.1)
SODIUM: 133 mmol/L — AB (ref 135–145)

## 2017-03-12 LAB — GLUCOSE, CAPILLARY: GLUCOSE-CAPILLARY: 266 mg/dL — AB (ref 65–99)

## 2017-03-12 LAB — MAGNESIUM: MAGNESIUM: 2.1 mg/dL (ref 1.7–2.4)

## 2017-03-12 MED ORDER — ONDANSETRON HCL 4 MG PO TABS: 4.0000 mg | ORAL_TABLET | Freq: Every day | ORAL | 0 refills | Status: DC | PRN

## 2017-03-12 MED ORDER — INSULIN GLARGINE 100 UNIT/ML SOLOSTAR PEN: 35.0000 [IU] | PEN_INJECTOR | Freq: Every day | SUBCUTANEOUS | Status: DC

## 2017-03-12 MED ORDER — METOCLOPRAMIDE HCL 5 MG PO TABS: 5.0000 mg | ORAL_TABLET | Freq: Four times a day (QID) | ORAL | 0 refills | Status: DC | PRN

## 2017-03-12 MED FILL — $LANTUS SOLOSTAR 100 UNITS/: 100 | 28 days supply | Qty: 12 | Fill #1

## 2017-03-12 NOTE — Progress Notes (Signed)
Pt states when at home he doesn't take his lantus when CBG 95. Will continue to monitor. Gregor HamsAlisha Fabrice Dyal, RN

## 2017-03-12 NOTE — Progress Notes (Signed)
Patient was in his bed awake. Pt requested to be given enough time to process about the AD and know whom to choose as his power of attorney. Pt had lots and lots of reflection on spiritual and general life's questions. Chaplain provided emotional support, reflective listening and compassionate presence.  Chaplain Johannah Rozas.

## 2017-03-12 NOTE — Discharge Summary (Signed)
Physician Discharge Summary  [name] [name] VEL:[id number] DOB: January 27, 1986 DOA: 03/09/2017  PCP: [name], MD  Admit date: 03/09/2017 Discharge date: 03/12/2017   Recommendations for Outpatient Follow-Up:   1. Needs close blood sugar monitoring   Discharge Diagnosis:   Principal Problem:   Diabetic ketoacidosis without coma associated with type 1 diabetes mellitus ([place]) Active Problems:   Dehydration   Substance abuse ([place])   HLD (hyperlipidemia)   DM (diabetes mellitus), type 1, uncontrolled ([place])   DKA, type 1 [facility])   Discharge disposition:  Home  Discharge Condition: Improved.  Diet recommendation: Low sodium, heart healthy.  Carbohydrate-modified.  Regular.  Wound care: None.   History of Present Illness:   31 year old male with a history of type 1 diabetes, 4 prior admissions  for DKA, who presents to the ED , for nausea vomiting, elevated sugars at home for the last 24 hours. Patient was noted to be pale diaphoretic and vomiting when he presented. Patient states that he has been compliant with his insulin regimen and has not missed any doses. He denies any fever chills rigors or intercurrent illness.  ED course BP 131/77  Pulse (!) 110  Temp (!) 97.4 F (36.3 C) (Oral)  Resp 16  Ht 1.676 m (5' 6")  Wt 54.4 kg (120 lb)  SpO2 99%  BMI 19.37 kg/m  Accu-Check 378, VBG pH of 7.33, PCO2 28, PO2 81,anion gap 23, bicarbonate 16, UA negative,UDS positive for marijuana   Hospital Course by Problem:   Diabetic ketoacidosis/uncontrolled diabetes mellitus, type I -transitioned off insulin gtt -Recently drank some alcohol, since then sugars have been uncontrolled.   -UA unremarkable for infection -Hemoglobin A1c 12.1 so obviously non-compliant at home -advance diet  Intractable nausea vomiting -Likely due to DKA -Improving -advance diet -Continue antiemetics as needed- added reglan as poorly controlled DM and probably has a component of  gastroparesis- outpatient follow up  Hypokalemia -repleted along with MG  Leukocytosis -Likely secondary to DKA, resolved -No source of infection, UA unremarkable, patient denies any cough or shortness of breath.  Currently afebrile -continue to monitor CBC  Marijuana abuse  -UDS positive for THC -Counseled to quit    Medical Consultants:    None.   Discharge Exam:   Vitals:   03/12/17 0401 03/12/17 0818  BP: (!) 136/98 138/88  Pulse:  (!) 104  Resp: 13 16  Temp: 98.1 F (36.7 C) 99.4 F (37.4 C)  SpO2:  100%   Vitals:   03/11/17 2157 03/12/17 0012 03/12/17 0401 03/12/17 0818  BP: (!) 132/98 (!) 144/102 (!) 136/98 138/88  Pulse:    (!) 104  Resp: _0 Temp:  98.5 F (36.9 C) 98.1 F (36.7 C) 99.4 F (37.4 C)  TempSrc:  Oral Oral Oral  SpO2: 98% 98%  100%  Weight:      Height:        Gen:  NAD- anxious to go home   The results of significant diagnostics from this hospitalization (including imaging, microbiology, ancillary and laboratory) are listed below for reference.     Procedures and Diagnostic Studies:   No results found.   Labs:   Basic Metabolic Panel:  Recent Labs Lab 03/09/17 1207  03/10/17 1539 03/10/17 1922 03/10/17 2315 03/11/17 1038 03/12/17 0214  NA 134*  < > 132* 129* 129* 133* 133*  K 4.9  < > 3.4* 3.2* 3.0* 2.9* 4.6  CL 102  < > 104 101 100* 102 101  CO2  12*  < > 20* 20* 21* 26 24  GLUCOSE 433*  < > 156* 114* 203* 128* 212*  BUN 13  < > _0 CREATININE 1.20  < > 0.58* 0.54* 0.59* 0.59* 0.57*  CALCIUM 9.3  < > 8.0* 7.9* 8.0* 8.4* 8.5*  MG 1.8  --   --   --   --   --  2.1  < > = values in this interval not displayed. GFR Estimated Creatinine Clearance: 95.3 mL/min (A) (by C-G formula based on SCr of 0.57 mg/dL (L)). Liver Function Tests:  Recent Labs Lab 03/09/17 0915 03/10/17 0330  AST 23 15  ALT 24 16*  ALKPHOS 74 45  BILITOT 1.4* 0.8  PROT 8.0 5.9*  ALBUMIN 4.7 3.5    Recent Labs Lab  03/09/17 0925  LIPASE 18   No results for input(s): AMMONIA in the last 168 hours. Coagulation profile No results for input(s): INR, PROTIME in the last 168 hours.  CBC:  Recent Labs Lab 03/09/17 0810 03/09/17 0925 03/09/17 1207 03/10/17 0330 03/11/17 0324  WBC 9.5 10.1 13.3* 9.2 5.2  NEUTROABS  --  9.1*  --  5.9  --   HGB 15.2 15.8 15.0 12.6* 12.5*  HCT 43.2 44.1 42.1 35.8* 35.9*  MCV 84.9 84.2 85.6 84.8 83.9  PLT 286 PLATELET CLUMPS NOTED ON SMEAR, UNABLE TO ESTIMATE 264 213 198   Cardiac Enzymes: No results for input(s): CKTOTAL, CKMB, CKMBINDEX, TROPONINI in the last 168 hours. BNP: Invalid input(s): POCBNP CBG:  Recent Labs Lab 03/11/17 1235 03/11/17 1326 03/11/17 1613 03/11/17 2054 03/12/17 1125  GLUCAP 57* 124* 123* 95 266*   D-Dimer No results for input(s): DDIMER in the last 72 hours. Hgb A1c  Recent Labs  03/09/17 1207  HGBA1C 12.1*   Lipid Profile  Recent Labs  03/09/17 1207  CHOL 197  HDL 63  LDLCALC 98  TRIG 180*  CHOLHDL 3.1   Thyroid function studies No results for input(s): TSH, T4TOTAL, T3FREE, THYROIDAB in the last 72 hours.  Invalid input(s): FREET3 Anemia work up No results for input(s): VITAMINB12, FOLATE, FERRITIN, TIBC, IRON, RETICCTPCT in the last 72 hours. Microbiology No results found for this or any previous visit (from the past 240 hour(s)).   Discharge Instructions:   Discharge Instructions    Diet - low sodium heart healthy    Complete by:  As directed    Diet Carb Modified    Complete by:  As directed    Discharge instructions    Complete by:  As directed    Marijuana cessation Avoid alcohol Bring log of blood sugars to PCP -may need to titrate lantus down if eating better   Increase activity slowly    Complete by:  As directed      Allergies as of 03/12/2017   No Known Allergies     Medication List    STOP taking these medications   insulin lispro 100 UNIT/ML KiwkPen Commonly known as:   HUMALOG     TAKE these medications   insulin aspart 100 UNIT/ML injection Commonly known as:  novoLOG Inject 15 Units into the skin 3 (three) times daily before meals.   Insulin Glargine 100 UNIT/ML Solostar Pen Commonly known as:  LANTUS Inject 35 Units into the skin daily at 10 pm.   metoCLOPramide 5 MG tablet Commonly known as:  REGLAN Take 1 tablet (5 mg total) by mouth every 6 (six) hours as needed for nausea.  ondansetron 4 MG tablet Commonly known as:  ZOFRAN Take 1 tablet (4 mg total) by mouth daily as needed for nausea or vomiting.   TRUE METRIX METER w/Device Kit 1 each by Does not apply route as needed.      Follow-up Information    [name], MD Follow up in 1 week(s).   Specialty:  Family Medicine Why:  may need titration of lantus           Time coordinating discharge: 35 min  Signed:   U    [facility] 03/12/2017, 11:49 AM

## 2017-06-29 ENCOUNTER — Emergency Department (HOSPITAL_COMMUNITY): Payer: Self-pay

## 2017-06-29 ENCOUNTER — Inpatient Hospital Stay (HOSPITAL_COMMUNITY)
Admission: EM | Admit: 2017-06-29 | Discharge: 2017-07-01 | DRG: 638 | Disposition: A | Discharge status: Home / Self Care | Payer: Self-pay | Attending: Internal Medicine | Admitting: Internal Medicine

## 2017-06-29 ENCOUNTER — Encounter (HOSPITAL_COMMUNITY): Payer: Self-pay

## 2017-06-29 ENCOUNTER — Other Ambulatory Visit: Payer: Self-pay

## 2017-06-29 DIAGNOSIS — E785 Hyperlipidemia, unspecified: Secondary | ICD-10-CM | POA: Diagnosis present

## 2017-06-29 DIAGNOSIS — Z91199 Patient's noncompliance with other medical treatment and regimen due to unspecified reason: Secondary | ICD-10-CM

## 2017-06-29 DIAGNOSIS — N181 Chronic kidney disease, stage 1: Secondary | ICD-10-CM | POA: Diagnosis present

## 2017-06-29 DIAGNOSIS — Z9111 Patient's noncompliance with dietary regimen: Secondary | ICD-10-CM

## 2017-06-29 DIAGNOSIS — E860 Dehydration: Secondary | ICD-10-CM | POA: Diagnosis present

## 2017-06-29 DIAGNOSIS — Z9114 Patient's other noncompliance with medication regimen: Secondary | ICD-10-CM

## 2017-06-29 DIAGNOSIS — Z9119 Patient's noncompliance with other medical treatment and regimen: Secondary | ICD-10-CM

## 2017-06-29 DIAGNOSIS — Z5329 Procedure and treatment not carried out because of patient's decision for other reasons: Secondary | ICD-10-CM

## 2017-06-29 DIAGNOSIS — IMO0002 Reserved for concepts with insufficient information to code with codable children: Secondary | ICD-10-CM | POA: Diagnosis present

## 2017-06-29 DIAGNOSIS — N179 Acute kidney failure, unspecified: Secondary | ICD-10-CM | POA: Diagnosis present

## 2017-06-29 DIAGNOSIS — E1110 Type 2 diabetes mellitus with ketoacidosis without coma: Secondary | ICD-10-CM | POA: Diagnosis present

## 2017-06-29 DIAGNOSIS — Z87891 Personal history of nicotine dependence: Secondary | ICD-10-CM

## 2017-06-29 DIAGNOSIS — E1010 Type 1 diabetes mellitus with ketoacidosis without coma: Principal | ICD-10-CM | POA: Diagnosis present

## 2017-06-29 DIAGNOSIS — Z833 Family history of diabetes mellitus: Secondary | ICD-10-CM

## 2017-06-29 DIAGNOSIS — E108 Type 1 diabetes mellitus with unspecified complications: Secondary | ICD-10-CM

## 2017-06-29 DIAGNOSIS — E1022 Type 1 diabetes mellitus with diabetic chronic kidney disease: Secondary | ICD-10-CM | POA: Diagnosis present

## 2017-06-29 DIAGNOSIS — F1910 Other psychoactive substance abuse, uncomplicated: Secondary | ICD-10-CM | POA: Diagnosis present

## 2017-06-29 DIAGNOSIS — E876 Hypokalemia: Secondary | ICD-10-CM | POA: Diagnosis present

## 2017-06-29 DIAGNOSIS — E1065 Type 1 diabetes mellitus with hyperglycemia: Secondary | ICD-10-CM

## 2017-06-29 DIAGNOSIS — Z79899 Other long term (current) drug therapy: Secondary | ICD-10-CM

## 2017-06-29 LAB — I-STAT VENOUS BLOOD GAS, ED
Acid-base deficit: 18 mmol/L — ABNORMAL HIGH (ref 0.0–2.0)
Bicarbonate: 8.1 mmol/L — ABNORMAL LOW (ref 20.0–28.0)
O2 Saturation: 97 %
PCO2 VEN: 20.4 mmHg — AB (ref 44.0–60.0)
PH VEN: 7.206 — AB (ref 7.250–7.430)
PO2 VEN: 112 mmHg — AB (ref 32.0–45.0)
TCO2: 9 mmol/L — ABNORMAL LOW (ref 22–32)

## 2017-06-29 LAB — URINALYSIS, ROUTINE W REFLEX MICROSCOPIC
BACTERIA UA: NONE SEEN
Bilirubin Urine: NEGATIVE
Hgb urine dipstick: NEGATIVE
KETONES UR: 80 mg/dL — AB
LEUKOCYTES UA: NEGATIVE
Nitrite: NEGATIVE
PH: 5 (ref 5.0–8.0)
PROTEIN: NEGATIVE mg/dL
RBC / HPF: NONE SEEN RBC/hpf (ref 0–5)
SQUAMOUS EPITHELIAL / LPF: NONE SEEN
Specific Gravity, Urine: 1.024 (ref 1.005–1.030)
WBC, UA: NONE SEEN WBC/hpf (ref 0–5)

## 2017-06-29 LAB — BASIC METABOLIC PANEL
Anion gap: 27 — ABNORMAL HIGH (ref 5–15)
BUN: 20 mg/dL (ref 6–20)
BUN: 17 mg/dL (ref 6–20)
BUN: 13 mg/dL (ref 6–20)
BUN: 11 mg/dL (ref 6–20)
CHLORIDE: 101 mmol/L (ref 101–111)
CO2: 10 mmol/L — AB (ref 22–32)
CO2: 9 mmol/L — ABNORMAL LOW (ref 22–32)
CO2: 16 mmol/L — ABNORMAL LOW (ref 22–32)
CO2: 17 mmol/L — ABNORMAL LOW (ref 22–32)
CREATININE: 1.41 mg/dL — AB (ref 0.61–1.24)
Calcium: 9.5 mg/dL (ref 8.9–10.3)
Calcium: 8 mg/dL — ABNORMAL LOW (ref 8.9–10.3)
Calcium: 7.9 mg/dL — ABNORMAL LOW (ref 8.9–10.3)
Calcium: 7.9 mg/dL — ABNORMAL LOW (ref 8.9–10.3)
Chloride: 119 mmol/L — ABNORMAL HIGH (ref 101–111)
Chloride: 115 mmol/L — ABNORMAL HIGH (ref 101–111)
Chloride: 114 mmol/L — ABNORMAL HIGH (ref 101–111)
Creatinine, Ser: 1.29 mg/dL — ABNORMAL HIGH (ref 0.61–1.24)
Creatinine, Ser: 0.98 mg/dL (ref 0.61–1.24)
Creatinine, Ser: 0.75 mg/dL (ref 0.61–1.24)
GFR calc Af Amer: >60 mL/min (ref >60)
GFR calc Af Amer: >60 mL/min (ref >60)
GFR calc Af Amer: >60 mL/min (ref >60)
GFR calc non Af Amer: >60 mL/min (ref >60)
GLUCOSE: 585 mg/dL — AB (ref 65–99)
Glucose, Bld: 173 mg/dL — ABNORMAL HIGH (ref 65–99)
POTASSIUM: 4.7 mmol/L (ref 3.5–5.1)
SODIUM: 138 mmol/L (ref 135–145)
Sodium: 145 mmol/L (ref 135–145)
Sodium: 141 mmol/L (ref 135–145)

## 2017-06-29 LAB — CBG MONITORING, ED
GLUCOSE-CAPILLARY: 264 mg/dL — AB (ref 65–99)
GLUCOSE-CAPILLARY: 244 mg/dL — AB (ref 65–99)
GLUCOSE-CAPILLARY: 206 mg/dL — AB (ref 65–99)
Glucose-Capillary: 512 mg/dL (ref 65–99)
Glucose-Capillary: 438 mg/dL — ABNORMAL HIGH (ref 65–99)
Glucose-Capillary: 381 mg/dL — ABNORMAL HIGH (ref 65–99)
Glucose-Capillary: 210 mg/dL — ABNORMAL HIGH (ref 65–99)
Glucose-Capillary: 183 mg/dL — ABNORMAL HIGH (ref 65–99)

## 2017-06-29 LAB — BASIC METABOLIC PANEL WITH GFR
Anion gap: 17 — ABNORMAL HIGH (ref 5–15)
Anion gap: 10 (ref 5–15)
Anion gap: 11 (ref 5–15)
GFR calc Af Amer: >60 mL/min (ref >60)
GFR calc non Af Amer: >60 mL/min (ref >60)
GFR calc non Af Amer: >60 mL/min (ref >60)
GFR calc non Af Amer: >60 mL/min (ref >60)
Glucose, Bld: 312 mg/dL — ABNORMAL HIGH (ref 65–99)
Glucose, Bld: 212 mg/dL — ABNORMAL HIGH (ref 65–99)
Potassium: 4.5 mmol/L (ref 3.5–5.1)
Potassium: 3.8 mmol/L (ref 3.5–5.1)
Potassium: 3.4 mmol/L — ABNORMAL LOW (ref 3.5–5.1)
Sodium: 142 mmol/L (ref 135–145)

## 2017-06-29 LAB — CBC
HEMATOCRIT: 40.8 % (ref 39.0–52.0)
Hemoglobin: 13.8 g/dL (ref 13.0–17.0)
MCH: 30.1 pg (ref 26.0–34.0)
MCHC: 33.8 g/dL (ref 30.0–36.0)
MCV: 88.9 fL (ref 78.0–100.0)
Platelets: 361 10*3/uL (ref 150–400)
RBC: 4.59 MIL/uL (ref 4.22–5.81)
RDW: 12.5 % (ref 11.5–15.5)
WBC: 10.7 10*3/uL — AB (ref 4.0–10.5)

## 2017-06-29 MED ORDER — SODIUM CHLORIDE 0.9 % IV SOLN
INTRAVENOUS | Status: DC
Start: 2017-06-29 — End: 2017-07-01
  Administered 2017-06-29 – 2017-06-30 (×2): via INTRAVENOUS

## 2017-06-29 MED ORDER — SODIUM CHLORIDE 0.9 % IV SOLN
INTRAVENOUS | Status: DC
  Administered 2017-06-29 – 2017-07-01 (×2): via INTRAVENOUS

## 2017-06-29 MED ORDER — SODIUM CHLORIDE 0.9 % IV SOLN
INTRAVENOUS | Status: AC
  Administered 2017-06-29: 14:00:00 via INTRAVENOUS

## 2017-06-29 MED ORDER — SODIUM CHLORIDE 0.9 % IV BOLUS (SEPSIS)
1000.0000 mL | Freq: Once | INTRAVENOUS | Status: AC
  Administered 2017-06-29: 1000 mL via INTRAVENOUS

## 2017-06-29 MED ORDER — DEXTROSE-NACL 5-0.45 % IV SOLN
INTRAVENOUS | Status: DC
  Administered 2017-06-29: 17:00:00 via INTRAVENOUS

## 2017-06-29 MED ORDER — SODIUM CHLORIDE 0.9 % IV SOLN
INTRAVENOUS | Status: DC
  Administered 2017-06-29: 6.4 [IU]/h via INTRAVENOUS
  Filled 2017-06-29: qty 1

## 2017-06-29 MED ORDER — SODIUM CHLORIDE 0.9 % IV SOLN
INTRAVENOUS | Status: DC
  Administered 2017-06-29: 3.8 [IU]/h via INTRAVENOUS
  Filled 2017-06-29: qty 1

## 2017-06-29 MED ORDER — INSULIN GLARGINE 100 UNIT/ML ~~LOC~~ SOLN
35.0000 [IU] | Freq: Every day | SUBCUTANEOUS | Status: DC
  Filled 2017-06-29: qty 0.35

## 2017-06-29 MED ORDER — DEXTROSE-NACL 5-0.45 % IV SOLN: INTRAVENOUS | Status: DC

## 2017-06-29 NOTE — ED Provider Notes (Signed)
[facility] EMERGENCY DEPARTMENT Provider Note   CSN: [id number] Arrival date & time: 06/29/17  2440     History   Chief Complaint Chief Complaint  Patient presents with  . Emesis    HPI [name] [name] is a 32 y.o. male with history of type 1 diabetes, DKA, here for evaluation of generalized weakness, dry mouth, polyuria since this morning. Has been compliant with insulin regimen including 15 units with meals and 24 units at bedtime. Reports nonproductive cough for the last 2 days. Denies fevers, chills, sore throat, chest pain, shortness of breath, abdominal pain, nausea, vomiting, dysuria, hematuria, diarrhea, sick contacts. Denies EtOH use.  HPI  Past Medical History:  Diagnosis Date  . ARF (acute renal failure) ([place]) 2011   pt was on dialysis "for 1 day approx 2011, pt is no longer on dialysis (03/09/2017)  . DKA (diabetic ketoacidoses) ([place])    "several times" (03/09/2017)  . Type I diabetes mellitus [facility]) Dx 2012    Patient Active Problem List   Diagnosis Date Noted  . DKA, type 1 ([place]) 03/09/2017  . Hypophosphatemia 08/27/2016  . AKI (acute kidney injury) ([place]) 07/11/2016  . Hyperkalemia 03/09/2016  . Renal insufficiency 03/09/2016  . Leukocytosis 03/09/2016  . Diabetic ketoacidosis without coma associated with type 1 diabetes mellitus ([place])   . Non-intractable vomiting with nausea   . Chest pain 12/18/2015  . Marijuana use 07/22/2015  . Proteinuria with type 1 diabetes mellitus ([place]) 07/22/2015  . High blood pressure 07/22/2015  . Uncontrolled type 1 diabetes mellitus with ketoacidosis without coma ([place])   . Hypocalcemia   . Acute renal failure ([place])   . Hypokalemia   . Hypomagnesemia   . Abnormal EKG 06/15/2015  . Sinus tachycardia   . Tachypnea   . Tinea pedis 12/17/2014  . Adjustment disorder with mixed anxiety and depressed mood 09/03/2014  . Protein-calorie malnutrition, severe ([place]) 07/06/2014  . DM (diabetes mellitus), type 1,  uncontrolled ([place]) 05/01/2014  . HLD (hyperlipidemia) 04/26/2014  . Substance abuse ([place])   . Dehydration 07/07/2013  . Fatigue 03/10/2012    Past Surgical History:  Procedure Laterality Date  . NO PAST SURGERIES         Home Medications    Prior to Admission medications   Medication Sig Start Date End Date Taking? Authorizing Provider  insulin aspart (NOVOLOG) 100 UNIT/ML injection Inject 15 Units into the skin 3 (three) times daily before meals.   Yes [provider]  Insulin Glargine (LANTUS) 100 UNIT/ML Solostar Pen Inject 35 Units into the skin daily at 10 pm. Patient taking differently: Inject 40 Units into the skin daily at 10 pm.  03/12/17  Yes Vann, Jessica U, DO  Blood Glucose Monitoring Suppl (TRUE METRIX METER) w/Device KIT 1 each by Does not apply route as needed. Patient not taking: Reported on 03/09/2017 07/12/16   Florencia Reasons, MD  metoCLOPramide (REGLAN) 5 MG tablet Take 1 tablet (5 mg total) by mouth every 6 (six) hours as needed for nausea. Patient not taking: Reported on 06/29/2017 03/12/17   Geradine Girt, DO  ondansetron (ZOFRAN) 4 MG tablet Take 1 tablet (4 mg total) by mouth daily as needed for nausea or vomiting. Patient not taking: Reported on 06/29/2017 03/12/17 03/12/18  Geradine Girt, DO    Family History Family History  Problem Relation Age of Onset  . Diabetes Father   . Diabetes Maternal Grandmother   . Heart disease Neg Hx   . Hypertension  Neg Hx     Social History Social History   Tobacco Use  . Smoking status: Former Smoker    Packs/day: 0.25    Years: 10.00    Pack years: 2.50    Types: Cigarettes    Last attempt to quit: 09/19/2013    Years since quitting: 3.7  . Smokeless tobacco: Never Used  Substance Use Topics  . Alcohol use: Yes    Alcohol/week: 0.0 oz    Comment: 03/09/2017 "normally I don't drink; drank 3 shots liquor 2 days ago"  . Drug use: No     Allergies   Patient has no known allergies.   Review of  Systems Review of Systems  Constitutional: Positive for fatigue.  Endocrine: Positive for polydipsia and polyuria.  Neurological: Positive for weakness.  All other systems reviewed and are negative.    Physical Exam Updated Vital Signs BP 125/69   Pulse (!) 122   Temp 98.3 F (36.8 C) (Oral)   Resp 18   Ht '5\' 6"'$  (1.676 m)   Wt 52.2 kg (115 lb)   SpO2 100%   BMI 18.56 kg/m   Physical Exam  Constitutional: He appears well-developed and well-nourished. No distress.  Looks uncomfortable. Awake. Mentating well. Axo to self, place time and event.   HENT:  Head: Normocephalic and atraumatic.  Nose: Nose normal.  Mouth/Throat: No oropharyngeal exudate.  Dry lips but MMM. Oropharynx, tonsils, nasal mucosa normal.   Eyes: Conjunctivae and EOM are normal. Pupils are equal, round, and reactive to light.  Neck: Normal range of motion.  Cardiovascular: Regular rhythm, normal heart sounds and intact distal pulses.  No murmur heard. Tachycardic. Soft BP. 2+ DP and radial pulses bilaterally. No LE edema.   Pulmonary/Chest: Effort normal and breath sounds normal.  Abdominal: Soft. Bowel sounds are normal. There is no tenderness.  No G/R/R. No suprapubic or CVA tenderness.   Musculoskeletal: Normal range of motion. He exhibits no deformity.  Moves all four extremities spontaneously without difficulty   Neurological: He is alert.  Alert and oriented to self, place, time and event.  Speech is fluent without aphasia. Strength 5/5 with hand grip and ankle F/E.   Sensation to light touch intact in hands and feet.  Skin: Skin is warm and dry. Capillary refill takes less than 2 seconds.  Psychiatric: He has a normal mood and affect. His behavior is normal. Judgment and thought content normal.  Nursing note and vitals reviewed.    ED Treatments / Results  Labs (all labs ordered are listed, but only abnormal results are displayed) Labs Reviewed  BASIC METABOLIC PANEL - Abnormal; Notable  for the following components:      Result Value   CO2 10 (*)    Glucose, Bld 585 (*)    Creatinine, Ser 1.41 (*)    Anion gap 27 (*)    All other components within normal limits  CBC - Abnormal; Notable for the following components:   WBC 10.7 (*)    All other components within normal limits  URINALYSIS, ROUTINE W REFLEX MICROSCOPIC - Abnormal; Notable for the following components:   Color, Urine STRAW (*)    Glucose, UA >=500 (*)    Ketones, ur 80 (*)    All other components within normal limits  CBG MONITORING, ED - Abnormal; Notable for the following components:   Glucose-Capillary 512 (*)    All other components within normal limits  I-STAT VENOUS BLOOD GAS, ED - Abnormal; Notable for the following  components:   pH, Ven 7.206 (*)    pCO2, Ven 20.4 (*)    pO2, Ven 112.0 (*)    Bicarbonate 8.1 (*)    TCO2 9 (*)    Acid-base deficit 18.0 (*)    All other components within normal limits  BLOOD GAS, VENOUS  CBG MONITORING, ED    EKG  EKG Interpretation None       Radiology Dg Chest 2 View  Result Date: 06/29/2017 CLINICAL DATA:  Chest pain, shortness of Breath EXAM: CHEST  2 VIEW COMPARISON:  07/11/2016 FINDINGS: Heart and mediastinal contours are within normal limits. No focal opacities or effusions. No acute bony abnormality. IMPRESSION: No active cardiopulmonary disease. Electronically Signed   By: Rolm Baptise M.D.   On: 06/29/2017 11:57    Procedures .Critical Care Performed by: Kinnie Feil, PA-C Authorized by: Kinnie Feil, PA-C   Critical care provider statement:    Critical care time (minutes):  30   Critical care was necessary to treat or prevent imminent or life-threatening deterioration of the following conditions:  Metabolic crisis (DKA)   Critical care was time spent personally by me on the following activities:  Development of treatment plan with patient or surrogate, discussions with consultants, examination of patient, obtaining history  from patient or surrogate, ordering and performing treatments and interventions, ordering and review of laboratory studies, ordering and review of radiographic studies, re-evaluation of patient's condition and review of old charts   I assumed direction of critical care for this patient from another provider in my specialty: no     (including critical care time)  CRITICAL CARE Performed by: Kinnie Feil   Total critical care time: 30 minutes  Critical care time was exclusive of separately billable procedures and treating other patients.  Critical care was necessary to treat or prevent imminent or life-threatening deterioration.  Critical care was time spent personally by me on the following activities: development of treatment plan with patient and/or surrogate as well as nursing, discussions with consultants, evaluation of patient's response to treatment, examination of patient, obtaining history from patient or surrogate, ordering and performing treatments and interventions, ordering and review of laboratory studies, ordering and review of radiographic studies, pulse oximetry and re-evaluation of patient's condition.  Medications Ordered in ED Medications  insulin regular (NOVOLIN R,HUMULIN R) 100 Units in sodium chloride 0.9 % 100 mL (1 Units/mL) infusion (not administered)  dextrose 5 %-0.45 % sodium chloride infusion ( Intravenous Hold 06/29/17 1109)  sodium chloride 0.9 % bolus 1,000 mL (0 mLs Intravenous Stopped 06/29/17 1240)    And  sodium chloride 0.9 % bolus 1,000 mL (1,000 mLs Intravenous New Bag/Given 06/29/17 1138)    And  0.9 %  sodium chloride infusion (not administered)     Initial Impression / Assessment and Plan / ED Course  I have reviewed the triage vital signs and the nursing notes.  Pertinent labs & imaging results that were available during my care of the patient were reviewed by me and considered in my medical decision making (see chart for details).  Clinical  Course as of Jun 29 1240  Fri Jun 29, 2017  1040 Anion gap: (!) 27 [CG]  1040 Creatinine: (!) 1.41 [CG]  1040 Glucose: (!!) 585 [CG]  1205 CO2: (!) 10 [CG]  1205 Glucose: (!) >=500 [CG]  1205 pH, Ven: (!) 7.206 [CG]  1205 Bicarbonate: (!) 8.1 [CG]  1205 WBC: (!) 10.7 [CG]  1237 Spoke to hospitalist who will admit   [  CG]    Clinical Course User Index [CG] Kinnie Feil, PA-C   History and work up consistent with DKA. Glucose 585, bicarb 10, pH 7.2, AG 27. K is WNL. Pt mentating well. He is tachycardic and with soft BPs. Will initiate IVF and glucostabilizer. Will get CXR to rule out underlying infection.   Final Clinical Impressions(s) / ED Diagnoses   1240: Chest x-ray negative for infiltrate. ED workup consistent with DKA, as above. Spoke to hospitalist who will admit patient. Final diagnoses:  Diabetic ketoacidosis without coma associated with type 1 diabetes mellitus [facility])    ED Discharge Orders    None       Kinnie Feil, PA-C 06/29/17 1243    Malvin Johns, MD 06/29/17 1336

## 2017-06-29 NOTE — ED Notes (Signed)
Blood draw attempt x1

## 2017-06-29 NOTE — ED Triage Notes (Signed)
Per Pt, Pt has hx of Type 1 Diabetes. Complains of vomiting and increase urination. Reports compliance with meds. Utilizes insulin pens.

## 2017-06-29 NOTE — ED Notes (Signed)
Dinner tray ordered.

## 2017-06-29 NOTE — ED Notes (Signed)
Patient transported to X-ray 

## 2017-06-29 NOTE — ED Notes (Signed)
Insulin received from pharmacy.

## 2017-06-29 NOTE — H&P (Signed)
History and Physical    [name] [name] MRN:[id number] DOB: 12/02/1985 DOA: 06/29/2017  PCP: Funches, Josalyn, MD  Patient coming from: home  Chief Complaint: N/V, hyperglycmiea  HPI: [name] [name] is a 31 y.o. male with medical history significant of diabetes on insulin, poorly controlled, stage I CKD who awoke at approximately 4 AM this morning with nausea vomiting and feeling quite poorly.  Patient reports that he was in his usual state of health yesterday.  He reports taking his normal dose of glargine 35 units at night.  He reports last time he checked his blood sugar was in the afternoon that it was 350.  He reports he normally runs in the 250s.  His emesis did not have any blood, bile.  He did not have any abdominal pain.  Noted to be chest pain, lower extremity edema, shortness of breath, orthopnea, fevers, cough, congestion, shortness of breath.  He reports supreme compliance with his insulin however on review of his chart he has had multiple admissions for DKA which would argue against this.  He has no evidence on family history or on exam of either monogenic diabetes syndrome or mature onset diabetes of the young. ED Course: In the ED patient's vitals were notable for tachycardia to 122.  Labs were notable for a white count of 10.7 glucose of 500s.  Patient was noted to creatinine 1.41 with a baseline of 0.5.  Patient noted to have anion gap of 27 with a bicarb of 10.  Patient was started on normal saline boluses and then an infusion of saline and insulin.  Patient was admitted for DKA.  Chest x-ray was normal  Review of Systems: As per HPI otherwise 10 point review of systems negative.    Past Medical History:  Diagnosis Date  . ARF (acute renal failure) ([facility]) 2011   pt was on dialysis "for 1 day approx 2011, pt is no longer on dialysis (03/09/2017)  . DKA (diabetic ketoacidoses) ([facility])    "several times" (03/09/2017)  . Type I diabetes mellitus ([facility]) Dx 2012    Past Surgical  History:  Procedure Laterality Date  . NO PAST SURGERIES       reports that he quit smoking about 3 years ago. His smoking use included cigarettes. He has a 2.50 pack-year smoking history. he has never used smokeless tobacco. He reports that he drinks alcohol. He reports that he does not use drugs.  No Known Allergies  Family History  Problem Relation Age of Onset  . Diabetes Father   . Diabetes Maternal Grandmother   . Heart disease Neg Hx   . Hypertension Neg Hx    Unacceptable: Noncontributory, unremarkable, or negative. Acceptable: Family history reviewed and not pertinent (If you reviewed it)  Prior to Admission medications   Medication Sig Start Date End Date Taking? Authorizing Provider  insulin aspart (NOVOLOG) 100 UNIT/ML injection Inject 15 Units into the skin 3 (three) times daily before meals.   Yes [provider]  Insulin Glargine (LANTUS) 100 UNIT/ML Solostar Pen Inject 35 Units into the skin daily at 10 pm. Patient taking differently: Inject 40 Units into the skin daily at 10 pm.  03/12/17  Yes Vann, Jessica U, DO  Blood Glucose Monitoring Suppl (TRUE METRIX METER) w/Device KIT 1 each by Does not apply route as needed. Patient not taking: Reported on 03/09/2017 07/12/16   Xu, Fang, MD  metoCLOPramide (REGLAN) 5 MG tablet Take 1 tablet (5 mg total) by mouth every 6 (six) hours as   needed for nausea. Patient not taking: Reported on 06/29/2017 03/12/17   Vann, Jessica U, DO  ondansetron (ZOFRAN) 4 MG tablet Take 1 tablet (4 mg total) by mouth daily as needed for nausea or vomiting. Patient not taking: Reported on 06/29/2017 03/12/17 03/12/18  Vann, Jessica U, DO    Physical Exam: Vitals:   06/29/17 0940 06/29/17 1038 06/29/17 1137 06/29/17 1200  BP: 104/63 (!) 90/58 123/72 125/69  Pulse: (!) 107 (!) 117 (!) 136 (!) 122  Resp: 18     Temp:      TempSrc:      SpO2: 100% 100% 100% 100%  Weight:      Height:        Constitutional: NAD, calm,  comfortable Vitals:   06/29/17 0940 06/29/17 1038 06/29/17 1137 06/29/17 1200  BP: 104/63 (!) 90/58 123/72 125/69  Pulse: (!) 107 (!) 117 (!) 136 (!) 122  Resp: 18     Temp:      TempSrc:      SpO2: 100% 100% 100% 100%  Weight:      Height:       Eyes: Anicteric ENMT: Dry mucous membranes.  Neck: normal, supple Respiratory: clear to auscultation bilaterally, no wheezing, no crackles. Normal respiratory effort. No accessory muscle use.  Cardiovascular: 2 out of 6 systolic murmur heard best in right upper sternal border, no rubs Abdomen: no tenderness, no masses palpated. No hepatosplenomegaly. Bowel sounds positive.  Musculoskeletal: No edema Skin: no rashes on visible skin Neurologic: Grossly intact Psychiatric: Normal judgment and insight. Alert and oriented x 3. Normal mood.   Labs on Admission: I have personally reviewed following labs and imaging studies  CBC: Recent Labs  Lab 06/29/17 0856  WBC 10.7*  HGB 13.8  HCT 40.8  MCV 88.9  PLT 361   Basic Metabolic Panel: Recent Labs  Lab 06/29/17 0856  NA 138  K 4.7  CL 101  CO2 10*  GLUCOSE 585*  BUN 20  CREATININE 1.41*  CALCIUM 9.5   GFR: Estimated Creatinine Clearance: 55 mL/min (A) (by C-G formula based on SCr of 1.41 mg/dL (H)). Liver Function Tests: No results for input(s): AST, ALT, ALKPHOS, BILITOT, PROT, ALBUMIN in the last 168 hours. No results for input(s): LIPASE, AMYLASE in the last 168 hours. No results for input(s): AMMONIA in the last 168 hours. Coagulation Profile: No results for input(s): INR, PROTIME in the last 168 hours. Cardiac Enzymes: No results for input(s): CKTOTAL, CKMB, CKMBINDEX, TROPONINI in the last 168 hours. BNP (last 3 results) No results for input(s): PROBNP in the last 8760 hours. HbA1C: No results for input(s): HGBA1C in the last 72 hours. CBG: Recent Labs  Lab 06/29/17 0842  GLUCAP 512*   Lipid Profile: No results for input(s): CHOL, HDL, LDLCALC, TRIG, CHOLHDL,  LDLDIRECT in the last 72 hours. Thyroid Function Tests: No results for input(s): TSH, T4TOTAL, FREET4, T3FREE, THYROIDAB in the last 72 hours. Anemia Panel: No results for input(s): VITAMINB12, FOLATE, FERRITIN, TIBC, IRON, RETICCTPCT in the last 72 hours. Urine analysis:    Component Value Date/Time   COLORURINE STRAW (A) 06/29/2017 0853   APPEARANCEUR CLEAR 06/29/2017 0853   LABSPEC 1.024 06/29/2017 0853   PHURINE 5.0 06/29/2017 0853   GLUCOSEU >=500 (A) 06/29/2017 0853   HGBUR NEGATIVE 06/29/2017 0853   BILIRUBINUR NEGATIVE 06/29/2017 0853   BILIRUBINUR Negative 02/07/2016 1528   KETONESUR 80 (A) 06/29/2017 0853   PROTEINUR NEGATIVE 06/29/2017 0853   UROBILINOGEN 0.2 02/07/2016 1528   UROBILINOGEN 0.2   03/22/2015 0913   NITRITE NEGATIVE 06/29/2017 0853   LEUKOCYTESUR NEGATIVE 06/29/2017 0853    Radiological Exams on Admission: Dg Chest 2 View  Result Date: 06/29/2017 CLINICAL DATA:  Chest pain, shortness of Breath EXAM: CHEST  2 VIEW COMPARISON:  07/11/2016 FINDINGS: Heart and mediastinal contours are within normal limits. No focal opacities or effusions. No acute bony abnormality. IMPRESSION: No active cardiopulmonary disease. Electronically Signed   By: Kevin  Dover M.D.   On: 06/29/2017 11:57    EKG: Independently reviewed.  Sinus tachycardia  Assessment/Plan Active Problems:   Substance abuse ([facility])   HLD (hyperlipidemia)   DM (diabetes mellitus), type 1, uncontrolled ([facility])   Acute renal failure ([facility])   Uncontrolled type 1 diabetes mellitus with ketoacidosis without coma ([facility])    ##) type 1 diabetes with DKA: Patient is poorly compliant with insulin regimen. -Continue glargine 35 units nightly tonight -Continue insulin drip, start dextrose containing fluids after blood glucose drops below 250, check blood sugars every hour -Continue checking basic metabolic profile every 4 hours -Continue IV normal saline, consider transitioning to lactated Ringer's -Consider  starting statin prior to discharge as he merits a moderate intensity statin based on his diabetes control or lack thereof - We will check a UA once patient is sufficiently volume repleted to see if he would benefit from starting on an ACE inhibitor as he has a history of proteinuria likely secondary to his diabetes  ##) AKI: Likely secondary to dehydration -Continue IV fluids     C  MD [facility]  If 7PM-7AM, please contact night-coverage [email or web address] Password [email or web address]  06/29/2017, 1:05 PM    

## 2017-06-30 ENCOUNTER — Other Ambulatory Visit: Payer: Self-pay

## 2017-06-30 DIAGNOSIS — E7849 Other hyperlipidemia: Secondary | ICD-10-CM

## 2017-06-30 LAB — BASIC METABOLIC PANEL WITH GFR
Anion gap: 8 (ref 5–15)
Anion gap: 10 (ref 5–15)
Anion gap: 12 (ref 5–15)
Anion gap: 9 (ref 5–15)
CO2: 18 mmol/L — ABNORMAL LOW (ref 22–32)
CO2: 18 mmol/L — ABNORMAL LOW (ref 22–32)
CO2: 18 mmol/L — ABNORMAL LOW (ref 22–32)
CO2: 18 mmol/L — ABNORMAL LOW (ref 22–32)
CO2: 19 mmol/L — ABNORMAL LOW (ref 22–32)
Calcium: 7.6 mg/dL — ABNORMAL LOW (ref 8.9–10.3)
Calcium: 7.5 mg/dL — ABNORMAL LOW (ref 8.9–10.3)
Calcium: 7.5 mg/dL — ABNORMAL LOW (ref 8.9–10.3)
Calcium: 7.7 mg/dL — ABNORMAL LOW (ref 8.9–10.3)
Calcium: 7.6 mg/dL — ABNORMAL LOW (ref 8.9–10.3)
Chloride: 111 mmol/L (ref 101–111)
Chloride: 109 mmol/L (ref 101–111)
Creatinine, Ser: 0.55 mg/dL — ABNORMAL LOW (ref 0.61–1.24)
GFR calc Af Amer: >60 mL/min (ref >60)
GFR calc Af Amer: >60 mL/min (ref >60)
GFR calc Af Amer: >60 mL/min (ref >60)
GFR calc Af Amer: >60 mL/min (ref >60)
GFR calc Af Amer: >60 mL/min (ref >60)
GFR calc non Af Amer: >60 mL/min (ref >60)
GFR calc non Af Amer: >60 mL/min (ref >60)
Sodium: 137 mmol/L (ref 135–145)
Sodium: 140 mmol/L (ref 135–145)
Sodium: 138 mmol/L (ref 135–145)

## 2017-06-30 LAB — GLUCOSE, CAPILLARY
Glucose-Capillary: 100 mg/dL — ABNORMAL HIGH (ref 65–99)
Glucose-Capillary: 101 mg/dL — ABNORMAL HIGH (ref 65–99)
Glucose-Capillary: 108 mg/dL — ABNORMAL HIGH (ref 65–99)
Glucose-Capillary: 113 mg/dL — ABNORMAL HIGH (ref 65–99)
Glucose-Capillary: 118 mg/dL — ABNORMAL HIGH (ref 65–99)
Glucose-Capillary: 126 mg/dL — ABNORMAL HIGH (ref 65–99)
Glucose-Capillary: 69 mg/dL (ref 65–99)
Glucose-Capillary: 81 mg/dL (ref 65–99)
Glucose-Capillary: 84 mg/dL (ref 65–99)
Glucose-Capillary: 88 mg/dL (ref 65–99)
Glucose-Capillary: 90 mg/dL (ref 65–99)
Glucose-Capillary: 99 mg/dL (ref 65–99)

## 2017-06-30 LAB — BASIC METABOLIC PANEL
Anion gap: 10 (ref 5–15)
BUN: 10 mg/dL (ref 6–20)
BUN: 10 mg/dL (ref 6–20)
BUN: 9 mg/dL (ref 6–20)
BUN: 9 mg/dL (ref 6–20)
BUN: 7 mg/dL (ref 6–20)
Chloride: 109 mmol/L (ref 101–111)
Chloride: 110 mmol/L (ref 101–111)
Chloride: 110 mmol/L (ref 101–111)
Creatinine, Ser: 0.61 mg/dL (ref 0.61–1.24)
Creatinine, Ser: 0.6 mg/dL — ABNORMAL LOW (ref 0.61–1.24)
Creatinine, Ser: 0.6 mg/dL — ABNORMAL LOW (ref 0.61–1.24)
Creatinine, Ser: 0.58 mg/dL — ABNORMAL LOW (ref 0.61–1.24)
GFR calc non Af Amer: >60 mL/min (ref >60)
GFR calc non Af Amer: >60 mL/min (ref >60)
GFR calc non Af Amer: >60 mL/min (ref >60)
Glucose, Bld: 98 mg/dL (ref 65–99)
Glucose, Bld: 115 mg/dL — ABNORMAL HIGH (ref 65–99)
Glucose, Bld: 94 mg/dL (ref 65–99)
Glucose, Bld: 66 mg/dL (ref 65–99)
Glucose, Bld: 91 mg/dL (ref 65–99)
Potassium: 3.3 mmol/L — ABNORMAL LOW (ref 3.5–5.1)
Potassium: 3 mmol/L — ABNORMAL LOW (ref 3.5–5.1)
Potassium: 3 mmol/L — ABNORMAL LOW (ref 3.5–5.1)
Potassium: 3.1 mmol/L — ABNORMAL LOW (ref 3.5–5.1)
Potassium: 3.3 mmol/L — ABNORMAL LOW (ref 3.5–5.1)
Sodium: 137 mmol/L (ref 135–145)
Sodium: 137 mmol/L (ref 135–145)

## 2017-06-30 MED ORDER — INSULIN ASPART 100 UNIT/ML ~~LOC~~ SOLN
0.0000 [IU] | SUBCUTANEOUS | Status: DC
  Administered 2017-06-30: 1 [IU] via SUBCUTANEOUS
  Administered 2017-07-01: 3 [IU] via SUBCUTANEOUS

## 2017-06-30 MED ORDER — ENOXAPARIN SODIUM 30 MG/0.3ML ~~LOC~~ SOLN
30.0000 mg | SUBCUTANEOUS | Status: DC
  Administered 2017-06-30: 30 mg via SUBCUTANEOUS
  Filled 2017-06-30: qty 0.3

## 2017-06-30 MED ORDER — POTASSIUM CHLORIDE CRYS ER 20 MEQ PO TBCR
50.0000 meq | EXTENDED_RELEASE_TABLET | Freq: Once | ORAL | Status: AC
  Administered 2017-06-30: 50 meq via ORAL
  Filled 2017-06-30: qty 2

## 2017-06-30 MED ORDER — INSULIN GLARGINE 100 UNIT/ML ~~LOC~~ SOLN
15.0000 [IU] | Freq: Every day | SUBCUTANEOUS | Status: DC
Start: 2017-06-30 — End: 2017-07-01
  Administered 2017-06-30: 15 [IU] via SUBCUTANEOUS
  Filled 2017-06-30 (×2): qty 0.15

## 2017-06-30 NOTE — Progress Notes (Signed)
Pt has been stable throughout shift.  Was advised to come off of glucose stabilizer. CBGs have been between 99-113. Verbal order from Dr. Arbutus Leasat, glucose stabilizer was stopped and 15 units of lantus was given. Will continue to monitor.

## 2017-06-30 NOTE — Progress Notes (Signed)
PROGRESS NOTE    [name] [name]  ZOX:[id number] DOB: Jan 07, 1986 DOA: 06/29/2017 PCP: Dessa Phi, MD   Brief Narrative:  32 y.o. BM PMHx DM Type I uncontrolled with, complication CKD stage I,   Awoke at approximately 4 AM this morning with nausea vomiting and feeling quite poorly.  Patient reports that he was in his usual state of health yesterday.  He reports taking his normal dose of glargine 35 units at night.  He reports last time he checked his blood sugar was in the afternoon that it was 350.  He reports he normally runs in the 250s.  His emesis did not have any blood, bile.  He did not have any abdominal pain.  Noted to be chest pain, lower extremity edema, shortness of breath, orthopnea, fevers, cough, congestion, shortness of breath.  He reports supreme compliance with his insulin however on review of his chart he has had multiple admissions for DKA which would argue against this.  He has no evidence on family history or on exam of either monogenic diabetes syndrome or mature onset diabetes of the young. ED Course: In the ED patient's vitals were notable for tachycardia to 122.  Labs were notable for a white count of 10.7 glucose of 500s.  Patient was noted to creatinine 1.41 with a baseline of 0.5.  Patient noted to have anion gap of 27 with a bicarb of 10.  Patient was started on normal saline boluses and then an infusion of saline and insulin.  Patient was admitted for DKA.  Chest x-ray was normal    Subjective: 2/9 A/O 4, negative CP, negative SOB, negative abdominal pain, negative instability. States father died at age 30 years old secondary to complications from diabetes. Uncle died at age 77 secondary to complications from diabetes. States follow-up in the wellness clinic by Dr. Armen Pickup for his diabetes. Admits does not actually adhere to diabetic diet.    Assessment & Plan:   Active Problems:   Substance abuse (HCC)   HLD (hyperlipidemia)   DM (diabetes mellitus), type  1, uncontrolled (HCC)   Acute renal failure (HCC)   Uncontrolled type 1 diabetes mellitus with ketoacidosis without coma (HCC)   DKA (diabetic ketoacidoses) (HCC)  DKA/Diabetes Type 1 uncontrolled with complication -Lantus 15 units daily -Sensitive SSI -2/9 Consult Diabetic Coordinator: Patient with poor understanding of disease process and poor control all diabetes requires extensive education -2/9 Consult Diabetic Nutritionist: -Lipid panel pending   Acute kidney injury  -Most likely secondary to dehydration secondary to DKA Recent Labs  Lab 06/29/17 0856 06/29/17 1540 06/29/17 1938 06/29/17 2147 06/30/17 0155 06/30/17 0558  CREATININE 1.41* 1.29* 0.98 0.75 0.61 0.60*  -returned to baseline   Hypokalemia -K-Dur 50 mEq     DVT prophylaxis: Lovenox Code Status: Will Family Communication: None Disposition Plan: Discharge on 2/10   Consultants:    Procedures/Significant Events:     I have personally reviewed and interpreted all radiology studies and my findings are as above.  VENTILATOR SETTINGS:    Cultures   Antimicrobials:    Devices    LINES / TUBES:      Continuous Infusions: . sodium chloride Stopped (06/29/17 1334)  . sodium chloride 125 mL/hr at 06/29/17 1335  . dextrose 5 % and 0.45% NaCl Stopped (06/29/17 1109)  . dextrose 5 % and 0.45% NaCl 125 mL/hr at 06/29/17 1657  . insulin (NOVOLIN-R) infusion Stopped (06/30/17 0524)     Objective: Vitals:   06/29/17 2330 06/30/17 0000 06/30/17 8119  06/30/17 0418  BP: (!) 135/93 115/84 101/81 101/61  Pulse: 88 92 86 91  Resp: 15 13 15 11   Temp:   98.6 F (37 C) 98.6 F (37 C)  TempSrc:   Oral Oral  SpO2: 100% 100% 100% 100%  Weight:   110 lb 3.7 oz (50 kg)   Height:        Intake/Output Summary (Last 24 hours) at 06/30/2017 0817 Last data filed at 06/30/2017 0400 Gross per 24 hour  Intake 3170.96 ml  Output 2600 ml  Net 570.96 ml   Filed Weights   06/29/17 0840 06/29/17 1330  06/30/17 0047  Weight: 115 lb (52.2 kg) 115 lb (52.2 kg) 110 lb 3.7 oz (50 kg)    Examination:  General: A/O 4, No acute respiratory distress Neck:  Negative scars, masses, torticollis, lymphadenopathy, JVD Lungs: Clear to auscultation bilaterally without wheezes or crackles Cardiovascular: Regular rate and rhythm without murmur gallop or rub normal S1 and S2 Abdomen: negative abdominal pain, nondistended, positive soft, bowel sounds, no rebound, no ascites, no appreciable mass Extremities: No significant cyanosis, clubbing, or edema bilateral lower extremities Skin: Negative rashes, lesions, ulcers Psychiatric:  Positive depression, negative anxiety, negative fatigue, negative mania  Central nervous system:  Cranial nerves II through XII intact, tongue/uvula midline, all extremities muscle strength 5/5, sensation intact throughout,  negative dysarthria, negative expressive aphasia, negative receptive aphasia.  .     Data Reviewed: Care during the described time interval was provided by me .  I have reviewed this patient's available data, including medical history, events of note, physical examination, and all test results as part of my evaluation.   CBC: Recent Labs  Lab 06/29/17 0856  WBC 10.7*  HGB 13.8  HCT 40.8  MCV 88.9  PLT 361   Basic Metabolic Panel: Recent Labs  Lab 06/29/17 1540 06/29/17 1938 06/29/17 2147 06/30/17 0155 06/30/17 0558  NA 145 141 142 137 137  K 4.5 3.8 3.4* 3.3* 3.0*  CL 119* 115* 114* 111 109  CO2 9* 16* 17* 18* 18*  GLUCOSE 312* 212* 173* 98 115*  BUN 17 13 11 10 10   CREATININE 1.29* 0.98 0.75 0.61 0.60*  CALCIUM 8.0* 7.9* 7.9* 7.6* 7.5*   GFR: Estimated Creatinine Clearance: 92.9 mL/min (A) (by C-G formula based on SCr of 0.6 mg/dL (L)). Liver Function Tests: No results for input(s): AST, ALT, ALKPHOS, BILITOT, PROT, ALBUMIN in the last 168 hours. No results for input(s): LIPASE, AMYLASE in the last 168 hours. No results for  input(s): AMMONIA in the last 168 hours. Coagulation Profile: No results for input(s): INR, PROTIME in the last 168 hours. Cardiac Enzymes: No results for input(s): CKTOTAL, CKMB, CKMBINDEX, TROPONINI in the last 168 hours. BNP (last 3 results) No results for input(s): PROBNP in the last 8760 hours. HbA1C: No results for input(s): HGBA1C in the last 72 hours. CBG: Recent Labs  Lab 06/30/17 0316 06/30/17 0415 06/30/17 0521 06/30/17 0626 06/30/17 0753  GLUCAP 118* 100* 108* 113* 126*   Lipid Profile: No results for input(s): CHOL, HDL, LDLCALC, TRIG, CHOLHDL, LDLDIRECT in the last 72 hours. Thyroid Function Tests: No results for input(s): TSH, T4TOTAL, FREET4, T3FREE, THYROIDAB in the last 72 hours. Anemia Panel: No results for input(s): VITAMINB12, FOLATE, FERRITIN, TIBC, IRON, RETICCTPCT in the last 72 hours. Urine analysis:    Component Value Date/Time   COLORURINE STRAW (A) 06/29/2017 0853   APPEARANCEUR CLEAR 06/29/2017 0853   LABSPEC 1.024 06/29/2017 0853   PHURINE 5.0 06/29/2017  0853   GLUCOSEU >=500 (A) 06/29/2017 0853   HGBUR NEGATIVE 06/29/2017 0853   BILIRUBINUR NEGATIVE 06/29/2017 0853   BILIRUBINUR Negative 02/07/2016 1528   KETONESUR 80 (A) 06/29/2017 0853   PROTEINUR NEGATIVE 06/29/2017 0853   UROBILINOGEN 0.2 02/07/2016 1528   UROBILINOGEN 0.2 03/22/2015 0913   NITRITE NEGATIVE 06/29/2017 0853   LEUKOCYTESUR NEGATIVE 06/29/2017 0853   Sepsis Labs: @LABRCNTIP (procalcitonin:4,lacticidven:4)  )No results found for this or any previous visit (from the past 240 hour(s)).       Radiology Studies: Dg Chest 2 View  Result Date: 06/29/2017 CLINICAL DATA:  Chest pain, shortness of Breath EXAM: CHEST  2 VIEW COMPARISON:  07/11/2016 FINDINGS: Heart and mediastinal contours are within normal limits. No focal opacities or effusions. No acute bony abnormality. IMPRESSION: No active cardiopulmonary disease. Electronically Signed   By: Charlett Nose M.D.   On:  06/29/2017 11:57        Scheduled Meds: . insulin glargine  15 Units Subcutaneous Daily   Continuous Infusions: . sodium chloride Stopped (06/29/17 1334)  . sodium chloride 125 mL/hr at 06/29/17 1335  . dextrose 5 % and 0.45% NaCl Stopped (06/29/17 1109)  . dextrose 5 % and 0.45% NaCl 125 mL/hr at 06/29/17 1657  . insulin (NOVOLIN-R) infusion Stopped (06/30/17 0524)     LOS: 1 day    Time spent: 40 minutes    Vertis Scheib, Roselind Messier, MD [facility] Pager [phone]   If 7PM-7AM, please contact night-coverage [email or web address] Password [facility] 06/30/2017, 8:17 AM

## 2017-06-30 NOTE — Plan of Care (Signed)
Nutrition Education Note  *Please note, patient reports he had not been looking at the serving size on labels when adding up the amount of carbs he consumed. Could easily be cause of chronic hyperglycemia  RD consulted for nutrition education regarding diabetes.   Lab Results  Component Value Date   HGBA1C 12.1 (H) 03/09/2017   Patient is a type 1 diabetic. Has had recurrent instances of DKA. He reports compliance w/ insulin. Reportedly has a poor understanding of disease process.   On RD arrival, patient is still not feeling too well. His lunch tray is beside him, untouched.   RD reviewed why it is important to control his blood sugars. Reiterated that uncontrolled diabetes can result in unhealing wounds requiring amputations, blindness, kidney damage and an overall decline in QOL.   Went through dietary recall.  Breakfast: On days he works, he will eats a bowl of cold cereal. On days he doesn't work, he will eat a "full course meal" Lunch: Typically, he will have a sandwich, typically [place] Dinner: Home cooked meal. Exact items vary, always has a eat, but not always a vegetable.  Other: Reports infrequent intake of starchy vegetables, however, he does eat rice/noodles often.  Beverages: Essentially only Water.   RD reiterated that with type 1 diabetes, what he eats is not as important as with a type 2, as long as he accurately counts carbs. That said, I recommended that he not eat carb-only meals, such as his cold cereal in the morning. RD recommended eating a boiled egg, a protein bar, or PB toast with this to balance it out.   Reviewed what items had carbohydrates. Reviewed BG goals of 70-110 before meal and <180 after meal.   We reviewed carb counting in detail. He says he reads labels. However, he says he does NOT look at the portion size. RD explained why this is a major issue and how is likely vastly underestimating how many carbs he is consuming due to portion sizes usually being  unrealistically small. We went through multiple examples with theoretical foods, with varying portion sizes and carb amount, and reviewed how to accurately ascertain how many carbs he is consuming.   For foods that do not have nutrition labels ie fruits, vegetables, potatoes RD Provided list of serving sizes of common foods.  He says he is good with his phone. RD recommended downloading apps that can tell him the carb amounts of foods that do not come with labels.   RD urged him to ask any questions he may have as he needs to get his DM under control. He said he did not have any. Left handouts: "Carbohydrate Counting for People with Diabetes" and "Diabetes Label Reading Tips" from the Academy of Nutrition and Dietetics.   Summary of recommendations 1. Read labels when available. Always make sure to look at total carbs AND serving size 2. Avoid Carb-only meals. Try to always eat a protein/vegetable with meals  3. BG goals should be 70-110 prior to meal and <180 afterwards.  4. Learn common food exchanges to determine carb amount of foods w/o label.  5. Use carb counting applications for help determining carb amounts of foods w/o label  Expect: Fair-Good Compliance: Patient sounds to have made a large mistake by not taking into account portion size. His REPORTED diet does not sound to be too poor (drinks only water, avoids starchy vegetables, doesn't eat out etc).   Body mass index is 17.26 kg/m. Pt meets criteria for Underweight  based on current BMI.   No further nutrition interventions warranted at this time. If additional nutrition issues arise, please re-consult RD.  Christophe LouisNathan Franks RD, LDN, CNSC Clinical Nutrition Pager: [phone] 06/30/2017 3:26 PM

## 2017-06-30 NOTE — Progress Notes (Signed)
ANTICOAGULATION CONSULT NOTE   Pharmacy Consult for lovenox Indication: VTE prophylaxis  No Known Allergies  Vital Signs: Temp: 98.9 F (37.2 C) (02/09 1613) Temp Source: Oral (02/09 1613) BP: 112/70 (02/09 1613) Pulse Rate: 78 (02/09 1613)  Labs: Recent Labs    06/29/17 0856  06/30/17 0936 06/30/17 1236 06/30/17 1705  HGB 13.8  --   --   --   --   HCT 40.8  --   --   --   --   PLT 361  --   --   --   --   CREATININE 1.41*   < > 0.60* 0.58* 0.55*   < > = values in this interval not displayed.    Medical History: Past Medical History:  Diagnosis Date  . ARF (acute renal failure) (HCC) 2011   pt was on dialysis "for 1 day approx 2011, pt is no longer on dialysis (03/09/2017)  . DKA (diabetic ketoacidoses) (HCC)    "several times" (03/09/2017)  . Type I diabetes mellitus (HCC) Dx 2012    Assessment: Lovenox for VTE prophylaxis.   Goal of Therapy:  Monitor platelets by anticoagulation protocol: Yes   Plan:  1. Lovenox 30 mg subcutaneously every 24 hours with weight < 60 kg 2. Will follow peripherally  Pollyann SamplesAndy Willowdean Luhmann, PharmD, BCPS 06/30/2017, 7:41 PM

## 2017-07-01 DIAGNOSIS — Z91199 Patient's noncompliance with other medical treatment and regimen due to unspecified reason: Secondary | ICD-10-CM

## 2017-07-01 DIAGNOSIS — IMO0002 Reserved for concepts with insufficient information to code with codable children: Secondary | ICD-10-CM

## 2017-07-01 DIAGNOSIS — E108 Type 1 diabetes mellitus with unspecified complications: Secondary | ICD-10-CM

## 2017-07-01 DIAGNOSIS — E1065 Type 1 diabetes mellitus with hyperglycemia: Secondary | ICD-10-CM

## 2017-07-01 DIAGNOSIS — Z5329 Procedure and treatment not carried out because of patient's decision for other reasons: Secondary | ICD-10-CM

## 2017-07-01 DIAGNOSIS — N179 Acute kidney failure, unspecified: Secondary | ICD-10-CM

## 2017-07-01 LAB — LIPID PANEL
CHOL/HDL RATIO: 2.9 ratio
CHOLESTEROL: 123 mg/dL (ref 0–200)
HDL: 42 mg/dL (ref >40)
LDL Cholesterol: 64 mg/dL (ref 0–99)
TRIGLYCERIDES: 83 mg/dL (ref <150)
VLDL: 17 mg/dL (ref 0–40)

## 2017-07-01 LAB — GLUCOSE, CAPILLARY
Glucose-Capillary: 67 mg/dL (ref 65–99)
Glucose-Capillary: 65 mg/dL (ref 65–99)
Glucose-Capillary: 150 mg/dL — ABNORMAL HIGH (ref 65–99)
Glucose-Capillary: 214 mg/dL — ABNORMAL HIGH (ref 65–99)

## 2017-07-01 LAB — BASIC METABOLIC PANEL
Anion gap: 10 (ref 5–15)
BUN: <5 mg/dL — ABNORMAL LOW (ref 6–20)
CALCIUM: 7.8 mg/dL — AB (ref 8.9–10.3)
CHLORIDE: 105 mmol/L (ref 101–111)
CO2: 23 mmol/L (ref 22–32)
CREATININE: 0.47 mg/dL — AB (ref 0.61–1.24)
GFR calc non Af Amer: >60 mL/min (ref >60)
GLUCOSE: 67 mg/dL (ref 65–99)
Potassium: 2.8 mmol/L — ABNORMAL LOW (ref 3.5–5.1)
Sodium: 138 mmol/L (ref 135–145)

## 2017-07-01 LAB — MAGNESIUM: Magnesium: 1.8 mg/dL (ref 1.7–2.4)

## 2017-07-01 MED ORDER — DEXTROSE 50 % IV SOLN
INTRAVENOUS | Status: AC
  Administered 2017-07-01: 25 mL
  Filled 2017-07-01: qty 50

## 2017-07-01 MED ORDER — MAGNESIUM OXIDE 400 (241.3 MG) MG PO TABS
400.0000 mg | ORAL_TABLET | Freq: Once | ORAL | Status: AC
  Administered 2017-07-01: 400 mg via ORAL
  Filled 2017-07-01: qty 1

## 2017-07-01 MED ORDER — INSULIN GLARGINE 100 UNIT/ML ~~LOC~~ SOLN
13.0000 [IU] | Freq: Every day | SUBCUTANEOUS | Status: DC
Start: 2017-07-02 — End: 2017-07-01

## 2017-07-01 MED ORDER — INSULIN GLARGINE 100 UNIT/ML SOLOSTAR PEN: 13.0000 [IU] | PEN_INJECTOR | Freq: Every day | SUBCUTANEOUS | 11 refills | Status: DC

## 2017-07-01 MED ORDER — POTASSIUM CHLORIDE CRYS ER 20 MEQ PO TBCR
60.0000 meq | EXTENDED_RELEASE_TABLET | Freq: Once | ORAL | Status: AC
  Administered 2017-07-01: 60 meq via ORAL
  Filled 2017-07-01: qty 3

## 2017-07-01 NOTE — Plan of Care (Signed)
Reviewed and updated goals.

## 2017-07-01 NOTE — Progress Notes (Signed)
Inpatient Diabetes Program Recommendations  AACE/ADA: New Consensus Statement on Inpatient Glycemic Control (2015)  Target Ranges:  Prepandial:   less than 140 mg/dL      Peak postprandial:   less than 180 mg/dL (1-2 hours)      Critically ill patients:  140 - 180 mg/dL   Results for [name] [name], [name] [name] (MRN [id number]) as of 07/01/2017 09:03  Ref. Range 06/30/2017 06:26 06/30/2017 07:53 06/30/2017 12:02 06/30/2017 15:37 06/30/2017 16:21 06/30/2017 20:30  Glucose-Capillary Latest Ref Range: 65 - 99 mg/dL 409113 (H) 811126 (H) 69 81 84 90   Results for [name] [name], [name] [name] (MRN [id number]) as of 07/01/2017 09:03  Ref. Range 06/30/2017 23:39 07/01/2017 03:53 07/01/2017 06:03 07/01/2017 07:03  Glucose-Capillary Latest Ref Range: 65 - 99 mg/dL 88 67 65 213150 (H)      Home DM Meds: Lantus 40 units QHS        Novolog 15 units TID  Current Orders: Lantus 15 units daily       Novolog Sensitive Correction Scale/ SSI (0-9 units) Q4 hours    Transitioned off the IV Insulin drip yesterday AM.  Levemir 15 units daily started at 7am yesterday.  Patient required only 1 unit Nvovolog yesterday.  HYPOglycemic at 12pm yesterday and again today at 4am and 6am.    MD- Patient well known to the Inpatient Diabetes Program.  Had 5 admissions in 2018 for DKA.  Counseled by the DM Coordinator during past visits.  Plan to see patient 1st thing Monday AM (DM Coordinator not physically on campus over the weekend).  Please consider reducing Lantus slightly to 13 units daily      --Will follow patient during hospitalization--  Ambrose FinlandJeannine Johnston Cordarius Benning RN, MSN, CDE Diabetes Coordinator Inpatient Glycemic Control Team Team Pager: [phone] (8a-5p)

## 2017-07-01 NOTE — Progress Notes (Signed)
Discharged to home with family office visits in place teaching done  

## 2017-07-01 NOTE — Progress Notes (Signed)

## 2017-07-01 NOTE — Discharge Summary (Signed)
Physician Discharge Summary  [name] [name] RDE:[id number] DOB: [birth year] DOA: 06/29/2017  PCP: [name], MD  Admit date: 06/29/2017 Discharge date: 07/01/2017  Time spent: 35 minutes  Recommendations for Outpatient Follow-up:   DKA/Diabetes Type 1 uncontrolled with complication -Patient refusing to eat diabetic diet during hospitalization. Uncooperative with staff. Threatening to leave AMA. Myself, RN staff, have explained to patient that his noncompliance with our requests will ensure that we will not be able to titrate medication effectively. Patient continues to refuse to comply with requests for treatment. Patient has been counseled that his actions will lead to MI, CVA, and DEATH. Patient understands and still refuses to comply. Will discharge patient. -Lantus 13 units daily per DM coordinator recommendation. -Will restart NovoLog 15 units QAC (patient's previous home dose) -Per DM coordinator well-known to their service as patient has been admitted 5 times in 2018 for DKA. -Lipid panel within ADA guidelines.  -Follow up with Dr. [name] in 1-2 weeks  diabetes uncontrolled with complication, noncompliance, acute kidney injury,    Acute kidney injury  -Most likely secondary to dehydration secondary to DKA Recent Labs  Lab 06/29/17 2147 06/30/17 0155 06/30/17 0558 06/30/17 0936 06/30/17 1236 06/30/17 1705 07/01/17 0338  CREATININE 0.75 0.61 0.60* 0.60* 0.58* 0.55* 0.47*  -Resolved   Hypokalemia -K-Dur 50 mEq, prior to discharge  Noncompliance -Patient has been counseled since admission that his noncompliance with medication, diabetic diet, and appointments with physician is increasing patient's morbidity and mortality. Patient is been counseled that he is at increased risk for MI, stroke, DEATH. -Follow up with Dr. [name] in 1-2 weeks  diabetes uncontrolled with complication, noncompliance, acute kidney injury,   Discharge Diagnoses:  Active  Problems:   Substance abuse ([place])   HLD (hyperlipidemia)   DM (diabetes mellitus), type 1, uncontrolled ([place])   Acute renal failure ([place])   Uncontrolled type 1 diabetes mellitus with ketoacidosis without coma ([place])   DKA (diabetic ketoacidoses) ([place])   Discharge Condition: Stable  Diet recommendation: American diabetic Association  Filed Weights   06/29/17 0840 06/29/17 1330 06/30/17 0047  Weight: 115 lb (52.2 kg) 115 lb (52.2 kg) 110 lb 3.7 oz (50 kg)    History of present illness:  32 y.o. BM PMHx DM Type I uncontrolled with, complication CKD stage I,    Awoke at approximately 4 AM this morning with nausea vomiting and feeling quite poorly.  Patient reports that he was in his usual state of health yesterday.  He reports taking his normal dose of glargine 35 units at night.  He reports last time he checked his blood sugar was in the afternoon that it was 350.  He reports he normally runs in the 250s.  His emesis did not have any blood, bile.  He did not have any abdominal pain.  Noted to be chest pain, lower extremity edema, shortness of breath, orthopnea, fevers, cough, congestion, shortness of breath.  He reports supreme compliance with his insulin however on review of his chart he has had multiple admissions for DKA which would argue against this.  He has no evidence on family history or on exam of either monogenic diabetes syndrome or mature onset diabetes of the young. ED Course: In the ED patient's vitals were notable for tachycardia to 122.  Labs were notable for a white count of 10.7 glucose of 500s.  Patient was noted to creatinine 1.41 with a baseline of 0.5.  Patient noted to have anion gap of 27 with a bicarb of  10.  Patient was started on normal saline boluses and then an infusion of saline and insulin.  Patient was admitted for DKA.  Chest x-ray was normal   Patient treated with glucose stabilizer protocol responded well. Diabetic coordinator and diabetic nutritionist  consulted. Stable for discharge    Consultations: Diabetic coordinator Diabetic nutritionist    Discharge Exam: Vitals:   06/30/17 2033 06/30/17 2340 07/01/17 0355 07/01/17 0946  BP: (!) 157/98 139/88 (!) 138/95 (!) 145/102  Pulse: 62 83 70 64  Resp: '12 17 12 16  '$ Temp: 98.6 F (37 C) 98.6 F (37 C) (!) 97.4 F (36.3 C) 98.2 F (36.8 C)  TempSrc: Oral Oral Oral Oral  SpO2: 100% 100% 100% 100%  Weight:      Height:        General: A/O 4, No acute respiratory distress Neck:  Negative scars, masses, torticollis, lymphadenopathy, JVD Lungs: Clear to auscultation bilaterally without wheezes or crackles Cardiovascular: Regular rate and rhythm without murmur gallop or rub normal S1 and S2 Abdomen: negative abdominal pain, nondistended, positive soft, bowel sounds, no rebound, no ascites, no appreciable mass  Discharge Instructions   Allergies as of 07/01/2017   No Known Allergies     Medication List    TAKE these medications   insulin aspart 100 UNIT/ML injection Commonly known as:  novoLOG Inject 15 Units into the skin 3 (three) times daily before meals.   Insulin Glargine 100 UNIT/ML Solostar Pen Commonly known as:  LANTUS Inject 13 Units into the skin daily at 10 pm. What changed:  how much to take   metoCLOPramide 5 MG tablet Commonly known as:  REGLAN Take 1 tablet (5 mg total) by mouth every 6 (six) hours as needed for nausea.   ondansetron 4 MG tablet Commonly known as:  ZOFRAN Take 1 tablet (4 mg total) by mouth daily as needed for nausea or vomiting.   TRUE METRIX METER w/Device Kit 1 each by Does not apply route as needed.      No Known Allergies Follow-up Information    [name], MD. Schedule an appointment as soon as possible for a visit in 2 week(s).   Specialty:  Family Medicine Why:  Follow up with Dr. [name] in 1-2 weeks  diabetes uncontrolled with complication, noncompliance, acute kidney injury, Contact  information: [place] [address] [phone]            The results of significant diagnostics from this hospitalization (including imaging, microbiology, ancillary and laboratory) are listed below for reference.    Significant Diagnostic Studies: Dg Chest 2 View  Result Date: 06/29/2017 CLINICAL DATA:  Chest pain, shortness of Breath EXAM: CHEST  2 VIEW COMPARISON:  07/11/2016 FINDINGS: Heart and mediastinal contours are within normal limits. No focal opacities or effusions. No acute bony abnormality. IMPRESSION: No active cardiopulmonary disease. Electronically Signed   By: Rolm Baptise M.D.   On: 06/29/2017 11:57    Microbiology: No results found for this or any previous visit (from the past 240 hour(s)).   Labs: Basic Metabolic Panel: Recent Labs  Lab 06/30/17 0558 06/30/17 0936 06/30/17 1236 06/30/17 1705 07/01/17 0338  NA 137 137 140 138 138  K 3.0* 3.0* 3.1* 3.3* 2.8*  CL 109 109 110 110 105  CO2 18* 18* 18* 19* 23  GLUCOSE 115* 94 66 91 67  BUN '10 9 9 7 '$ <5*  CREATININE 0.60* 0.60* 0.58* 0.55* 0.47*  CALCIUM 7.5* 7.5* 7.7* 7.6* 7.8*  MG  --   --   --   --  1.8   Liver Function Tests: No results for input(s): AST, ALT, ALKPHOS, BILITOT, PROT, ALBUMIN in the last 168 hours. No results for input(s): LIPASE, AMYLASE in the last 168 hours. No results for input(s): AMMONIA in the last 168 hours. CBC: Recent Labs  Lab 06/29/17 0856  WBC 10.7*  HGB 13.8  HCT 40.8  MCV 88.9  PLT 361   Cardiac Enzymes: No results for input(s): CKTOTAL, CKMB, CKMBINDEX, TROPONINI in the last 168 hours. BNP: BNP (last 3 results) No results for input(s): BNP in the last 8760 hours.  ProBNP (last 3 results) No results for input(s): PROBNP in the last 8760 hours.  CBG: Recent Labs  Lab 06/30/17 2339 07/01/17 0353 07/01/17 0603 07/01/17 0703 07/01/17 1156  GLUCAP 88 67 65 150* 214*       Signed:  Dia Crawford, MD [facility] [phone] pager

## 2017-07-02 LAB — GLUCOSE, CAPILLARY
Glucose-Capillary: 117 mg/dL — ABNORMAL HIGH (ref 65–99)
Glucose-Capillary: 148 mg/dL — ABNORMAL HIGH (ref 65–99)
Glucose-Capillary: 154 mg/dL — ABNORMAL HIGH (ref 65–99)

## 2017-08-15 MED FILL — $LANTUS SOLOSTAR 100 UNITS/: 100 | 30 days supply | Qty: 12 | Fill #2

## 2017-08-16 ENCOUNTER — Ambulatory Visit: Payer: Self-pay

## 2017-08-21 ENCOUNTER — Other Ambulatory Visit: Payer: Self-pay

## 2017-08-21 ENCOUNTER — Inpatient Hospital Stay (HOSPITAL_COMMUNITY)
Admission: EM | Admit: 2017-08-21 | Discharge: 2017-08-23 | DRG: 639 | Disposition: A | Discharge status: Home / Self Care | Payer: Self-pay | Attending: Internal Medicine | Admitting: Internal Medicine

## 2017-08-21 ENCOUNTER — Emergency Department (HOSPITAL_COMMUNITY): Payer: Self-pay

## 2017-08-21 ENCOUNTER — Encounter (HOSPITAL_COMMUNITY): Payer: Self-pay

## 2017-08-21 DIAGNOSIS — E1159 Type 2 diabetes mellitus with other circulatory complications: Secondary | ICD-10-CM | POA: Diagnosis present

## 2017-08-21 DIAGNOSIS — Z833 Family history of diabetes mellitus: Secondary | ICD-10-CM

## 2017-08-21 DIAGNOSIS — R112 Nausea with vomiting, unspecified: Secondary | ICD-10-CM

## 2017-08-21 DIAGNOSIS — R030 Elevated blood-pressure reading, without diagnosis of hypertension: Secondary | ICD-10-CM | POA: Diagnosis present

## 2017-08-21 DIAGNOSIS — E860 Dehydration: Secondary | ICD-10-CM | POA: Diagnosis present

## 2017-08-21 DIAGNOSIS — IMO0002 Reserved for concepts with insufficient information to code with codable children: Secondary | ICD-10-CM | POA: Diagnosis present

## 2017-08-21 DIAGNOSIS — E1110 Type 2 diabetes mellitus with ketoacidosis without coma: Secondary | ICD-10-CM | POA: Diagnosis present

## 2017-08-21 DIAGNOSIS — E1010 Type 1 diabetes mellitus with ketoacidosis without coma: Principal | ICD-10-CM | POA: Diagnosis present

## 2017-08-21 DIAGNOSIS — E1065 Type 1 diabetes mellitus with hyperglycemia: Secondary | ICD-10-CM | POA: Diagnosis present

## 2017-08-21 DIAGNOSIS — Z87891 Personal history of nicotine dependence: Secondary | ICD-10-CM

## 2017-08-21 DIAGNOSIS — E10649 Type 1 diabetes mellitus with hypoglycemia without coma: Secondary | ICD-10-CM

## 2017-08-21 DIAGNOSIS — I10 Essential (primary) hypertension: Secondary | ICD-10-CM

## 2017-08-21 DIAGNOSIS — Z9119 Patient's noncompliance with other medical treatment and regimen: Secondary | ICD-10-CM

## 2017-08-21 DIAGNOSIS — E876 Hypokalemia: Secondary | ICD-10-CM | POA: Diagnosis present

## 2017-08-21 DIAGNOSIS — E785 Hyperlipidemia, unspecified: Secondary | ICD-10-CM | POA: Diagnosis present

## 2017-08-21 DIAGNOSIS — Z794 Long term (current) use of insulin: Secondary | ICD-10-CM

## 2017-08-21 LAB — GLUCOSE, CAPILLARY
GLUCOSE-CAPILLARY: 122 mg/dL — AB (ref 65–99)
GLUCOSE-CAPILLARY: 163 mg/dL — AB (ref 65–99)
GLUCOSE-CAPILLARY: 174 mg/dL — AB (ref 65–99)
Glucose-Capillary: 253 mg/dL — ABNORMAL HIGH (ref 65–99)
Glucose-Capillary: 251 mg/dL — ABNORMAL HIGH (ref 65–99)
Glucose-Capillary: 256 mg/dL — ABNORMAL HIGH (ref 65–99)
Glucose-Capillary: 256 mg/dL — ABNORMAL HIGH (ref 65–99)
Glucose-Capillary: 208 mg/dL — ABNORMAL HIGH (ref 65–99)
Glucose-Capillary: 130 mg/dL — ABNORMAL HIGH (ref 65–99)
Glucose-Capillary: 120 mg/dL — ABNORMAL HIGH (ref 65–99)

## 2017-08-21 LAB — BASIC METABOLIC PANEL
ANION GAP: 21 — AB (ref 5–15)
Anion gap: 18 — ABNORMAL HIGH (ref 5–15)
Anion gap: 14 (ref 5–15)
Anion gap: 8 (ref 5–15)
BUN: 26 mg/dL — AB (ref 6–20)
BUN: 20 mg/dL (ref 6–20)
BUN: 19 mg/dL (ref 6–20)
BUN: 17 mg/dL (ref 6–20)
CALCIUM: 9.3 mg/dL (ref 8.9–10.3)
CALCIUM: 8.4 mg/dL — AB (ref 8.9–10.3)
CALCIUM: 8.2 mg/dL — AB (ref 8.9–10.3)
CALCIUM: 8.4 mg/dL — AB (ref 8.9–10.3)
CO2: 18 mmol/L — AB (ref 22–32)
CO2: 15 mmol/L — ABNORMAL LOW (ref 22–32)
CO2: 18 mmol/L — AB (ref 22–32)
CO2: 23 mmol/L (ref 22–32)
CREATININE: 0.68 mg/dL (ref 0.61–1.24)
CREATININE: 0.56 mg/dL — AB (ref 0.61–1.24)
Chloride: 95 mmol/L — ABNORMAL LOW (ref 101–111)
Chloride: 103 mmol/L (ref 101–111)
Chloride: 104 mmol/L (ref 101–111)
Chloride: 107 mmol/L (ref 101–111)
Creatinine, Ser: 0.83 mg/dL (ref 0.61–1.24)
Creatinine, Ser: 0.72 mg/dL (ref 0.61–1.24)
GFR calc Af Amer: >60 mL/min (ref >60)
GFR calc non Af Amer: >60 mL/min (ref >60)
GFR calc non Af Amer: >60 mL/min (ref >60)
GFR calc non Af Amer: >60 mL/min (ref >60)
GFR calc non Af Amer: >60 mL/min (ref >60)
GLUCOSE: 341 mg/dL — AB (ref 65–99)
Glucose, Bld: 242 mg/dL — ABNORMAL HIGH (ref 65–99)
Glucose, Bld: 240 mg/dL — ABNORMAL HIGH (ref 65–99)
Glucose, Bld: 116 mg/dL — ABNORMAL HIGH (ref 65–99)
Potassium: 3.6 mmol/L (ref 3.5–5.1)
Potassium: 3.8 mmol/L (ref 3.5–5.1)
Potassium: 3.5 mmol/L (ref 3.5–5.1)
Potassium: 3.1 mmol/L — ABNORMAL LOW (ref 3.5–5.1)
SODIUM: 136 mmol/L (ref 135–145)
SODIUM: 136 mmol/L (ref 135–145)
SODIUM: 138 mmol/L (ref 135–145)
Sodium: 134 mmol/L — ABNORMAL LOW (ref 135–145)

## 2017-08-21 LAB — CBC WITH DIFFERENTIAL/PLATELET
BASOS PCT: 0 %
Basophils Absolute: 0 10*3/uL (ref 0.0–0.1)
Eosinophils Absolute: 0 10*3/uL (ref 0.0–0.7)
Eosinophils Relative: 0 %
HEMATOCRIT: 44.4 % (ref 39.0–52.0)
Hemoglobin: 15.6 g/dL (ref 13.0–17.0)
LYMPHS PCT: 14 %
Lymphs Abs: 1.1 10*3/uL (ref 0.7–4.0)
MCH: 30.6 pg (ref 26.0–34.0)
MCHC: 35.1 g/dL (ref 30.0–36.0)
MCV: 87.1 fL (ref 78.0–100.0)
MONO ABS: 0.4 10*3/uL (ref 0.1–1.0)
Monocytes Relative: 6 %
NEUTROS ABS: 6.1 10*3/uL (ref 1.7–7.7)
Neutrophils Relative %: 80 %
Platelets: 285 10*3/uL (ref 150–400)
RBC: 5.1 MIL/uL (ref 4.22–5.81)
RDW: 12.5 % (ref 11.5–15.5)
WBC: 7.6 10*3/uL (ref 4.0–10.5)

## 2017-08-21 LAB — I-STAT CHEM 8, ED
BUN: 23 mg/dL — AB (ref 6–20)
CALCIUM ION: 1.1 mmol/L — AB (ref 1.15–1.40)
CREATININE: 0.6 mg/dL — AB (ref 0.61–1.24)
Chloride: 97 mmol/L — ABNORMAL LOW (ref 101–111)
GLUCOSE: 347 mg/dL — AB (ref 65–99)
HEMATOCRIT: 47 % (ref 39.0–52.0)
Hemoglobin: 16 g/dL (ref 13.0–17.0)
Potassium: 3.6 mmol/L (ref 3.5–5.1)
Sodium: 134 mmol/L — ABNORMAL LOW (ref 135–145)
TCO2: 21 mmol/L — AB (ref 22–32)

## 2017-08-21 LAB — URINALYSIS, ROUTINE W REFLEX MICROSCOPIC
BACTERIA UA: NONE SEEN
Bilirubin Urine: NEGATIVE
Glucose, UA: >=500 mg/dL — AB
Hgb urine dipstick: NEGATIVE
Ketones, ur: 80 mg/dL — AB
Leukocytes, UA: NEGATIVE
Nitrite: NEGATIVE
Protein, ur: 30 mg/dL — AB
RBC / HPF: NONE SEEN RBC/hpf (ref 0–5)
SPECIFIC GRAVITY, URINE: 1.03 (ref 1.005–1.030)
SQUAMOUS EPITHELIAL / LPF: NONE SEEN
pH: 5 (ref 5.0–8.0)

## 2017-08-21 LAB — CBG MONITORING, ED
GLUCOSE-CAPILLARY: 263 mg/dL — AB (ref 65–99)
GLUCOSE-CAPILLARY: 124 mg/dL — AB (ref 65–99)
Glucose-Capillary: 328 mg/dL — ABNORMAL HIGH (ref 65–99)
Glucose-Capillary: 242 mg/dL — ABNORMAL HIGH (ref 65–99)

## 2017-08-21 LAB — BLOOD GAS, VENOUS
Acid-base deficit: 9.3 mmol/L — ABNORMAL HIGH (ref 0.0–2.0)
BICARBONATE: 15.9 mmol/L — AB (ref 20.0–28.0)
FIO2: 0.21
O2 Saturation: 85.7 %
PATIENT TEMPERATURE: 98.6
PO2 VEN: 56.9 mmHg — AB (ref 32.0–45.0)
pCO2, Ven: 33.8 mmHg — ABNORMAL LOW (ref 44.0–60.0)
pH, Ven: 7.293 (ref 7.250–7.430)

## 2017-08-21 LAB — PHOSPHORUS: Phosphorus: 3.5 mg/dL (ref 2.5–4.6)

## 2017-08-21 LAB — MAGNESIUM: MAGNESIUM: 1.9 mg/dL (ref 1.7–2.4)

## 2017-08-21 LAB — MRSA PCR SCREENING: MRSA by PCR: NEGATIVE

## 2017-08-21 MED ORDER — POTASSIUM CHLORIDE CRYS ER 20 MEQ PO TBCR
40.0000 meq | EXTENDED_RELEASE_TABLET | Freq: Once | ORAL | Status: AC
  Administered 2017-08-22: 40 meq via ORAL
  Filled 2017-08-21: qty 2

## 2017-08-21 MED ORDER — DEXTROSE-NACL 5-0.45 % IV SOLN
INTRAVENOUS | Status: DC
  Administered 2017-08-21 (×2): via INTRAVENOUS

## 2017-08-21 MED ORDER — INSULIN ASPART 100 UNIT/ML ~~LOC~~ SOLN: 0.0000 [IU] | Freq: Every day | SUBCUTANEOUS | Status: DC

## 2017-08-21 MED ORDER — INSULIN GLARGINE 100 UNIT/ML ~~LOC~~ SOLN
13.0000 [IU] | Freq: Every day | SUBCUTANEOUS | Status: DC
  Administered 2017-08-22 (×2): 13 [IU] via SUBCUTANEOUS
  Filled 2017-08-21 (×2): qty 0.13

## 2017-08-21 MED ORDER — SODIUM CHLORIDE 0.9 % IV SOLN: INTRAVENOUS | Status: AC

## 2017-08-21 MED ORDER — ONDANSETRON HCL 4 MG/2ML IJ SOLN
4.0000 mg | Freq: Four times a day (QID) | INTRAMUSCULAR | Status: DC | PRN
  Administered 2017-08-21 – 2017-08-22 (×3): 4 mg via INTRAVENOUS
  Filled 2017-08-21 (×3): qty 2

## 2017-08-21 MED ORDER — SODIUM CHLORIDE 0.9 % IV SOLN
INTRAVENOUS | Status: DC
  Administered 2017-08-21: 2 [IU]/h via INTRAVENOUS
  Administered 2017-08-21: 1.9 [IU]/h via INTRAVENOUS
  Filled 2017-08-21 (×2): qty 1

## 2017-08-21 MED ORDER — SODIUM CHLORIDE 0.9 % IV SOLN
INTRAVENOUS | Status: DC
  Administered 2017-08-21: 16:00:00 via INTRAVENOUS

## 2017-08-21 MED ORDER — INSULIN ASPART 100 UNIT/ML ~~LOC~~ SOLN: 8.0000 [IU] | Freq: Once | SUBCUTANEOUS | Status: DC

## 2017-08-21 MED ORDER — ENOXAPARIN SODIUM 40 MG/0.4ML ~~LOC~~ SOLN
40.0000 mg | SUBCUTANEOUS | Status: DC
  Administered 2017-08-22: 40 mg via SUBCUTANEOUS
  Filled 2017-08-21: qty 0.4

## 2017-08-21 MED ORDER — POTASSIUM CHLORIDE 10 MEQ/100ML IV SOLN
10.0000 meq | INTRAVENOUS | Status: AC
  Administered 2017-08-21: 10 meq via INTRAVENOUS
  Filled 2017-08-21: qty 100

## 2017-08-21 MED ORDER — INSULIN ASPART 100 UNIT/ML ~~LOC~~ SOLN
12.0000 [IU] | Freq: Once | SUBCUTANEOUS | Status: AC
  Administered 2017-08-21: 12 [IU] via SUBCUTANEOUS

## 2017-08-21 MED ORDER — SODIUM CHLORIDE 0.9 % IV BOLUS
1000.0000 mL | Freq: Once | INTRAVENOUS | Status: AC
  Administered 2017-08-21: 1000 mL via INTRAVENOUS

## 2017-08-21 MED ORDER — INSULIN ASPART 100 UNIT/ML ~~LOC~~ SOLN
0.0000 [IU] | Freq: Three times a day (TID) | SUBCUTANEOUS | Status: DC
  Administered 2017-08-22: 2 [IU] via SUBCUTANEOUS
  Administered 2017-08-22: 1 [IU] via SUBCUTANEOUS
  Administered 2017-08-22: 3 [IU] via SUBCUTANEOUS
  Administered 2017-08-23: 2 [IU] via SUBCUTANEOUS

## 2017-08-21 MED ORDER — DEXTROSE-NACL 5-0.45 % IV SOLN: INTRAVENOUS | Status: DC

## 2017-08-21 MED ORDER — SODIUM CHLORIDE 0.9 % IV SOLN: INTRAVENOUS | Status: DC

## 2017-08-21 MED ORDER — ONDANSETRON HCL 4 MG/2ML IJ SOLN
4.0000 mg | Freq: Once | INTRAMUSCULAR | Status: AC
  Administered 2017-08-21: 4 mg via INTRAVENOUS
  Filled 2017-08-21: qty 2

## 2017-08-21 NOTE — ED Triage Notes (Signed)
EMS reports from home felling bad, nausea since Saturday, hx of uncontrolled hyperglycemia, Pt reports CBG 250 and 400 since. Pt states baseline usually over 200. This morning Pt Glucometer reading High.  BP 146/103 HR 10 Resp 16 Sp02 97 RA CBG " High"

## 2017-08-21 NOTE — ED Notes (Signed)
ED TO INPATIENT HANDOFF REPORT  Name/Age/Gender [name] [name] 32 y.o. male  Code Status    Code Status Orders  (From admission, onward)        Start     Ordered   08/21/17 1146  Full code  Continuous     08/21/17 1146    Code Status History    Date Active Date Inactive Code Status Order ID Comments User Context   06/29/2017 1331 07/01/2017 1918 Full Code [id number]  Cristy Folks, MD ED   03/09/2017 1158 03/12/2017 1552 Full Code [id number]  Reyne Dumas, MD ED   10/29/2016 2127 10/30/2016 1701 Full Code [id number]  Karmen Bongo, MD Inpatient   08/26/2016 1441 08/28/2016 1439 Full Code [id number]  Truett Mainland, DO Inpatient   07/11/2016 2138 07/12/2016 1951 Full Code [id number]  Lily Kocher, MD Inpatient   07/11/2016 2138 07/11/2016 2138 Full Code [id number]  Lily Kocher, MD Inpatient   05/29/2016 2319 05/31/2016 1759 Full Code [id number]  Toy Baker, MD Inpatient   03/09/2016 1135 03/11/2016 1625 Full Code [id number]  Debbe Odea, MD ED   12/18/2015 1217 12/19/2015 1918 Full Code [id number]  Mariel Aloe, MD Inpatient   12/01/2015 2310 12/03/2015 2130 Full Code [id number]  Rise Patience, MD ED   07/22/2015 0927 07/24/2015 1812 Full Code [id number]  Juluis Mire, MD ED   06/15/2015 2217 06/18/2015 1314 Full Code [id number]  Reubin Milan, MD Inpatient   06/06/2015 0200 06/07/2015 2026 Full Code [id number]  Vianne Bulls, MD ED   03/22/2015 1118 03/24/2015 1701 Full Code [id number]  Juluis Mire, MD ED   09/30/2014 0701 10/02/2014 1746 Full Code [id number]  Juanito Doom, MD Inpatient   08/30/2014 1521 09/03/2014 1708 Full Code [id number]  Blain Pais, MD ED   08/13/2014 1956 08/15/2014 1806 Full Code [id number]  Bonnielee Haff, MD ED   07/17/2014 0116 07/20/2014 1604 Full Code [id number]  Allyne Gee, MD Inpatient   07/05/2014 1040 07/08/2014 1533 Full Code [id number]  Marijean Heath, NP ED   07/01/2014 0010 07/02/2014 1404 Full Code [id number]  Allyne Gee,  MD Inpatient   04/25/2014 0835 04/28/2014 2113 Full Code [id number]  Allie Bossier, MD Inpatient   04/24/2014 0618 04/25/2014 0835 Full Code [id number]  Rise Patience, MD Inpatient   07/07/2013 2218 07/10/2013 1738 Full Code [id number]  Modena Jansky, MD Inpatient   03/11/2012 0046 03/22/2012 2111 Full Code [id number]  Sharlotte Alamo, RN Inpatient   03/10/2012 2009 03/11/2012 0046 Full Code [id number]  Derrill Kay, MD ED      Home/SNF/Other Home  Chief Complaint Hyperglycemia  Level of Care/Admitting Diagnosis ED Disposition    ED Disposition Condition [facility] Area: [place] [[id number]]  Level of Care: Stepdown [14]  Admit to SDU based on following criteria: Severe physiological/psychological symptoms:  Any diagnosis requiring assessment & intervention at least every 4 hours on an ongoing basis to obtain desired patient outcomes including stability and rehabilitation  Diagnosis: DKA (diabetic ketoacidoses) [facility]) [id number]  Admitting Physician: Georgette Shell [[id number]]  Attending Physician: Georgette Shell [[id number]]  Estimated length of stay: 3 - 4 days  Certification:: I certify this patient will need inpatient services for at least 2 midnights  PT Class (Do Not Modify): Inpatient [101]  PT Acc Code (Do Not Modify): Private [1]       Medical History Past Medical History:  Diagnosis Date  . ARF (acute renal failure) ([place])  2011   pt was on dialysis "for 1 day approx 2011, pt is no longer on dialysis (03/09/2017)  . DKA (diabetic ketoacidoses) ([place])    "several times" (03/09/2017)  . Type I diabetes mellitus ([place]) Dx 2012    Allergies No Known Allergies  IV Location/Drains/Wounds Patient Lines/Drains/Airways Status   Active Line/Drains/Airways    Name:   Placement date:   Placement time:   Site:   Days:   Peripheral IV 08/21/17 Right Antecubital   08/21/17    0822    Antecubital   less than 1           Labs/Imaging Results for orders placed or performed during the hospital encounter of 08/21/17 (from the past 48 hour(s))  Basic metabolic panel     Status: Abnormal   Collection Time: 08/21/17  8:04 AM  Result Value Ref Range   Sodium 134 (L) 135 - 145 mmol/L   Potassium 3.6 3.5 - 5.1 mmol/L   Chloride 95 (L) 101 - 111 mmol/L   CO2 18 (L) 22 - 32 mmol/L   Glucose, Bld 341 (H) 65 - 99 mg/dL   BUN 26 (H) 6 - 20 mg/dL   Creatinine, Ser 0.83 0.61 - 1.24 mg/dL   Calcium 9.3 8.9 - 10.3 mg/dL   GFR calc non Af Amer >60 >60 mL/min   GFR calc Af Amer >60 >60 mL/min    Comment: (NOTE) The eGFR has been calculated using the CKD EPI equation. This calculation has not been validated in all clinical situations. eGFR's persistently <60 mL/min signify possible Chronic Kidney Disease.    Anion gap 21 (H) 5 - 15    Comment: Performed at [facility], [place] [address], [place], [place] [address]  CBC with Differential (PNL)     Status: None   Collection Time: 08/21/17  8:04 AM  Result Value Ref Range   WBC 7.6 4.0 - 10.5 K/uL   RBC 5.10 4.22 - 5.81 MIL/uL   Hemoglobin 15.6 13.0 - 17.0 g/dL   HCT 44.4 39.0 - 52.0 %   MCV 87.1 78.0 - 100.0 fL   MCH 30.6 26.0 - 34.0 pg   MCHC 35.1 30.0 - 36.0 g/dL   RDW 12.5 11.5 - 15.5 %   Platelets 285 150 - 400 K/uL   Neutrophils Relative % 80 %   Neutro Abs 6.1 1.7 - 7.7 K/uL   Lymphocytes Relative 14 %   Lymphs Abs 1.1 0.7 - 4.0 K/uL   Monocytes Relative 6 %   Monocytes Absolute 0.4 0.1 - 1.0 K/uL   Eosinophils Relative 0 %   Eosinophils Absolute 0.0 0.0 - 0.7 K/uL   Basophils Relative 0 %   Basophils Absolute 0.0 0.0 - 0.1 K/uL    Comment: Performed at [facility], [place] [address] [place], [place] [address]  Magnesium     Status: None   Collection Time: 08/21/17  8:04 AM  Result Value Ref Range   Magnesium 1.9 1.7 - 2.4 mg/dL    Comment: Performed at [facility], [place] [address],  [place], [place] [address]  Phosphorus     Status: None   Collection Time: 08/21/17  8:04 AM  Result Value Ref Range   Phosphorus 3.5 2.5 - 4.6 mg/dL    Comment: Performed at [facility], [place] [address] [place],  [address]  POC CBG, ED     Status: Abnormal   Collection Time: 08/21/17  8:21 AM  Result Value Ref Range   Glucose-Capillary 328 (H) 65 - 99 mg/dL  I-Stat Chem 8, ED     Status: Abnormal   Collection Time: 08/21/17  8:28 AM  Result Value Ref Range   Sodium 134 (L) 135 - 145 mmol/L   Potassium 3.6 3.5 - 5.1 mmol/L   Chloride 97 (L) 101 - 111 mmol/L   BUN 23 (H) 6 - 20 mg/dL   Creatinine, Ser 0.60 (L) 0.61 - 1.24 mg/dL   Glucose, Bld 347 (H) 65 - 99 mg/dL   Calcium, Ion 1.10 (L) 1.15 - 1.40 mmol/L   TCO2 21 (L) 22 - 32 mmol/L   Hemoglobin 16.0 13.0 - 17.0 g/dL   HCT 47.0 39.0 - 52.0 %  Urinalysis, Routine w reflex microscopic     Status: Abnormal   Collection Time: 08/21/17  9:56 AM  Result Value Ref Range   Color, Urine YELLOW YELLOW   APPearance CLEAR CLEAR   Specific Gravity, Urine 1.030 1.005 - 1.030   pH 5.0 5.0 - 8.0   Glucose, UA >=500 (A) NEGATIVE mg/dL   Hgb urine dipstick NEGATIVE NEGATIVE   Bilirubin Urine NEGATIVE NEGATIVE   Ketones, ur 80 (A) NEGATIVE mg/dL   Protein, ur 30 (A) NEGATIVE mg/dL   Nitrite NEGATIVE NEGATIVE   Leukocytes, UA NEGATIVE NEGATIVE   RBC / HPF NONE SEEN 0 - 5 RBC/hpf   WBC, UA 0-5 0 - 5 WBC/hpf   Bacteria, UA NONE SEEN NONE SEEN   Squamous Epithelial / LPF NONE SEEN NONE SEEN   Mucus PRESENT     Comment: Performed at [facility], [place] [address], [place], [place] [address]  Blood gas, venous     Status: Abnormal   Collection Time: 08/21/17 10:06 AM  Result Value Ref Range   FIO2 0.21    pH, Ven 7.293 7.250 - 7.430   pCO2, Ven 33.8 (L) 44.0 - 60.0 mmHg   pO2, Ven 56.9 (H) 32.0 - 45.0 mmHg   Bicarbonate 15.9 (L) 20.0 - 28.0 mmol/L   Acid-base deficit 9.3 (H) 0.0 - 2.0 mmol/L   O2  Saturation 85.7 %   Patient temperature 98.6    Collection site VEIN    Drawn by DRAWN BY RN    Sample type VENOUS     Comment: Performed at [facility] [address], [place], [place] [address]  CBG monitoring, ED     Status: Abnormal   Collection Time: 08/21/17 10:07 AM  Result Value Ref Range   Glucose-Capillary 263 (H) 65 - 99 mg/dL  CBG monitoring, ED     Status: Abnormal   Collection Time: 08/21/17 12:06 PM  Result Value Ref Range   Glucose-Capillary 124 (H) 65 - 99 mg/dL   Dg Chest 2 View  Result Date: 08/21/2017 CLINICAL DATA:  Nausea and vomiting.  Weakness. EXAM: CHEST - 2 VIEW COMPARISON:  06/29/2017. FINDINGS: The heart size and mediastinal contours are within normal limits. Both lungs are clear. The visualized skeletal structures are unremarkable. IMPRESSION: No active cardiopulmonary disease.  Stable exam. Electronically Signed   By: Staci Righter M.D.   On: 08/21/2017 09:03    Pending Labs Unresulted Labs (From admission, onward)   Start     Ordered   08/22/17 0500  Comprehensive metabolic panel  Tomorrow morning,   R     08/21/17 1146   08/22/17 0500  CBC  Tomorrow morning,   R     08/21/17 1146  08/21/17 2595  Basic metabolic panel  STAT Now then every 4 hours ,   STAT     08/21/17 1143      Vitals/Pain Today's Vitals   08/21/17 1130 08/21/17 1200 08/21/17 1230 08/21/17 1300  BP: (!) 144/103 (!) 145/101 128/83 (!) 142/96  Pulse: (!) 108 (!) 113 (!) 106 (!) 107  Resp: (!) '9 17 18 19  '$ Temp:      TempSrc:      SpO2: 97% 97% 94% 100%  Weight:      Height:      PainSc:        Isolation Precautions No active isolations  Medications Medications  sodium chloride 0.9 % bolus 1,000 mL (0 mLs Intravenous Stopped 08/21/17 1001)    And  0.9 %  sodium chloride infusion (has no administration in time range)  dextrose 5 %-0.45 % sodium chloride infusion (has no administration in time range)  potassium chloride 10 mEq in 100 mL IVPB (0 mEq  Intravenous Stopped 08/21/17 1229)  insulin regular (NOVOLIN R,HUMULIN R) 100 Units in sodium chloride 0.9 % 100 mL (1 Units/mL) infusion ( Intravenous Stopped 08/21/17 1209)  0.9 %  sodium chloride infusion ( Intravenous Not Given 08/21/17 1145)  0.9 %  sodium chloride infusion ( Intravenous Not Given 08/21/17 1145)  dextrose 5 %-0.45 % sodium chloride infusion (has no administration in time range)  enoxaparin (LOVENOX) injection 40 mg (has no administration in time range)  sodium chloride 0.9 % bolus 1,000 mL (0 mLs Intravenous Stopped 08/21/17 0907)  ondansetron (ZOFRAN) injection 4 mg (4 mg Intravenous Given 08/21/17 0834)  sodium chloride 0.9 % bolus 1,000 mL (0 mLs Intravenous Stopped 08/21/17 1107)    Mobility walks

## 2017-08-21 NOTE — Progress Notes (Signed)
Pt was brought from ED off insulin gtt. Previous cbg was 242 in ED. Insulin was not restarted in ED. RN notified pharmacy for insulin. Will start pt back on gtt when it arrives.

## 2017-08-21 NOTE — ED Notes (Signed)
Bed: WA01 Expected date: 08/21/17 Expected time: 7:50 AM Means of arrival: Ambulance Comments: 32 yo m hyperglycemia

## 2017-08-21 NOTE — H&P (Signed)
History and Physical    [name] [name] CBU:[id number] DOB: Nov 25, 1985 DOA: 08/21/2017  PCP: [name], MD Patient coming from: Home Chief Complaint: Hyperglycemia HPI: [name] [name] is a 32 y.o. male with medical history significant of uncontrolled type 1 diabetes came in with complaints of ongoing nausea and vomiting for the last 24 hours.  Denies any diarrhea fever chills chest pain shortness of breath or cough.  No urinary complaints.  He reports that he is taking his insulin as prescribed and is watching his diet.  He denies any blood in his vomitus.  His last hospital admission was July 01, 2017 when he threatened to leave AMA.   ED Course: His ABG pH 7.29 PCO2 is 33 bicarbonate is 15.  UA is clear no evidence of leukocytes or nitrites 80 ketones present.  Sodium 134 potassium 3.6 BUN 23 creatinine 0.60 WBC count is 7.6 hemoglobin 15.6 platelet count 285.  Blood pressure 145 101 pulse 104 respiration 16 temperature 97% on room air.  In the ED he received IV fluids started on an insulin drip. Review of Systems: As per HPI otherwise all other systems reviewed and are negative.   Past Medical History:  Diagnosis Date  . ARF (acute renal failure) ([place]) 2011   pt was on dialysis "for 1 day approx 2011, pt is no longer on dialysis (03/09/2017)  . DKA (diabetic ketoacidoses) ([place])    "several times" (03/09/2017)  . Type I diabetes mellitus ([place]) Dx 2012    Past Surgical History:  Procedure Laterality Date  . NO PAST SURGERIES      Social History   Socioeconomic History  . Marital status: Single    Spouse name: Not on file  . Number of children: 1   . Years of education: 73   . Highest education level: Not on file  Occupational History  . Occupation: Astronomer at [employer]  . Financial resource strain: Not on file  . Food insecurity:    Worry: Not on file    Inability: Not on file  . Transportation needs:    Medical: Not on file    Non-medical: Not on  file  Tobacco Use  . Smoking status: Former Smoker    Packs/day: 0.25    Years: 10.00    Pack years: 2.50    Types: Cigarettes    Last attempt to quit: 09/19/2013    Years since quitting: 3.9  . Smokeless tobacco: Never Used  Substance and Sexual Activity  . Alcohol use: Yes    Alcohol/week: 0.0 oz    Comment: 03/09/2017 "normally I don't drink; drank 3 shots liquor 2 days ago"  . Drug use: No  . Sexual activity: Yes    Birth control/protection: Condom  Lifestyle  . Physical activity:    Days per week: Not on file    Minutes per session: Not on file  . Stress: Not on file  Relationships  . Social connections:    Talks on phone: Not on file    Gets together: Not on file    Attends religious service: Not on file    Active member of club or organization: Not on file    Attends meetings of clubs or organizations: Not on file    Relationship status: Not on file  . Intimate partner violence:    Fear of current or ex partner: Not on file    Emotionally abused: Not on file    Physically abused: Not on file  Forced sexual activity: Not on file  Other Topics Concern  . Not on file  Social History Narrative   Lives with mother and older brother.   Has 1 daughter, age 54.        No Known Allergies  Family History  Problem Relation Age of Onset  . Diabetes Father   . Diabetes Maternal Grandmother   . Heart disease Neg Hx   . Hypertension Neg Hx     Prior to Admission medications   Medication Sig Start Date End Date Taking? Authorizing Provider  Blood Glucose Monitoring Suppl (TRUE METRIX METER) w/Device KIT 1 each by Does not apply route as needed. 07/12/16  Yes Florencia Reasons, MD  insulin aspart (NOVOLOG) 100 UNIT/ML injection Inject 15 Units into the skin 3 (three) times daily before meals.   Yes [provider]  Insulin Glargine (LANTUS) 100 UNIT/ML Solostar Pen Inject 13 Units into the skin daily at 10 pm. 07/01/17  Yes Allie Bossier, MD  metoCLOPramide (REGLAN) 5  MG tablet Take 1 tablet (5 mg total) by mouth every 6 (six) hours as needed for nausea. Patient not taking: Reported on 06/29/2017 03/12/17   Geradine Girt, DO  ondansetron (ZOFRAN) 4 MG tablet Take 1 tablet (4 mg total) by mouth daily as needed for nausea or vomiting. Patient not taking: Reported on 06/29/2017 03/12/17 03/12/18  Geradine Girt, DO    Physical Exam: Vitals:   08/21/17 0900 08/21/17 0930 08/21/17 1001 08/21/17 1106  BP: (!) 155/109 (!) 140/93 (!) 141/103 (!) 145/101  Pulse: 97 99 (!) 102 (!) 104  Resp: _0 Temp:      TempSrc:      SpO2: 100% 98% 98% 97%  Weight:      Height:         General:  Appears calm and comfortable Eyes:  PERRL, EOMI, normal lids ENT: grossly normal hearing, lips & tongue, mmm dry Neck:  no LAD, masses or thyromegaly Cardiovascular:  RRR, no m/r/g. No LE edema.  Respiratory:  CTA bilaterally, no w/r/r. Normal respiratory effort. Abdomen:  soft, ntnd, NABS Skin: no rash or induration seen on limited exam Musculoskeletal:  grossly normal tone BUE/BLE, good ROM, no bony abnormality Psychiatric: grossly normal mood and affect, speech fluent and appropriate, AOx3 Neurologic: CN 2-12 grossly intact, moves all extremities in coordinated fashion, sensation intact  Labs on Admission: I have personally reviewed following labs and imaging studies  CBC: Recent Labs  Lab 08/21/17 0804 08/21/17 0828  WBC 7.6  --   NEUTROABS 6.1  --   HGB 15.6 16.0  HCT 44.4 47.0  MCV 87.1  --   PLT 285  --    Basic Metabolic Panel: Recent Labs  Lab 08/21/17 0804 08/21/17 0828  NA 134* 134*  K 3.6 3.6  CL 95* 97*  CO2 18*  --   GLUCOSE 341* 347*  BUN 26* 23*  CREATININE 0.83 0.60*  CALCIUM 9.3  --    GFR: Estimated Creatinine Clearance: 101.1 mL/min (A) (by C-G formula based on SCr of 0.6 mg/dL (L)). Liver Function Tests: No results for input(s): AST, ALT, ALKPHOS, BILITOT, PROT, ALBUMIN in the last 168 hours. No results for input(s):  LIPASE, AMYLASE in the last 168 hours. No results for input(s): AMMONIA in the last 168 hours. Coagulation Profile: No results for input(s): INR, PROTIME in the last 168 hours. Cardiac Enzymes: No results for input(s): CKTOTAL, CKMB, CKMBINDEX, TROPONINI in the last 168 hours.  BNP (last 3 results) No results for input(s): PROBNP in the last 8760 hours. HbA1C: No results for input(s): HGBA1C in the last 72 hours. CBG: Recent Labs  Lab 08/21/17 0821 08/21/17 1007  GLUCAP 328* 263*   Lipid Profile: No results for input(s): CHOL, HDL, LDLCALC, TRIG, CHOLHDL, LDLDIRECT in the last 72 hours. Thyroid Function Tests: No results for input(s): TSH, T4TOTAL, FREET4, T3FREE, THYROIDAB in the last 72 hours. Anemia Panel: No results for input(s): VITAMINB12, FOLATE, FERRITIN, TIBC, IRON, RETICCTPCT in the last 72 hours. Urine analysis:    Component Value Date/Time   COLORURINE YELLOW 08/21/2017 0956   APPEARANCEUR CLEAR 08/21/2017 0956   LABSPEC 1.030 08/21/2017 0956   PHURINE 5.0 08/21/2017 0956   GLUCOSEU >=500 (A) 08/21/2017 0956   HGBUR NEGATIVE 08/21/2017 0956   BILIRUBINUR NEGATIVE 08/21/2017 0956   BILIRUBINUR Negative 02/07/2016 1528   KETONESUR 80 (A) 08/21/2017 0956   PROTEINUR 30 (A) 08/21/2017 0956   UROBILINOGEN 0.2 02/07/2016 1528   UROBILINOGEN 0.2 03/22/2015 0913   NITRITE NEGATIVE 08/21/2017 0956   LEUKOCYTESUR NEGATIVE 08/21/2017 0956    Creatinine Clearance: Estimated Creatinine Clearance: 101.1 mL/min (A) (by C-G formula based on SCr of 0.6 mg/dL (L)).  Sepsis Labs: _0 (procalcitonin:4,lacticidven:4) )No results found for this or any previous visit (from the past 240 hour(s)).   Radiological Exams on Admission: Dg Chest 2 View  Result Date: 08/21/2017 CLINICAL DATA:  Nausea and vomiting.  Weakness. EXAM: CHEST - 2 VIEW COMPARISON:  06/29/2017. FINDINGS: The heart size and mediastinal contours are within normal limits. Both lungs are clear. The  visualized skeletal structures are unremarkable. IMPRESSION: No active cardiopulmonary disease.  Stable exam. Electronically Signed   By: Staci Righter M.D.   On: 08/21/2017 09:03    EKG: Independently reviewed.   Assessment/Plan Active Problems:   * No active hospital problems. *   1]DKA-patient presented with symptoms of ongoing nausea vomiting for 24 hours and hyperglycemia.  He was found to have a pH of 7.29 with a bicarb of 15.  His anion gap is 21 CO2 of 18.  There is no signs of infection identified as of yet.  He has had multiple hospital admissions for DKA due to noncompliance to insulin and diet.  He has been placed  on insulin drip will continue admit to stepdown unit monitor electrolytes and acidosis every 4 hourS.     DVT prophylaxis: Lovenox Code Status: Full code Family Communication: No family available Disposition Plan: TBD Consults called: None Admission status: Inpatient   Georgette Shell MD [facility]  If 7PM-7AM, please contact night-coverage [email or web address] Password [email or web address]  08/21/2017, 11:12 AM

## 2017-08-21 NOTE — ED Provider Notes (Signed)
[facility] DEPT Provider Note   CSN: [id number] Arrival date & time: 08/21/17  0750     History   Chief Complaint Chief Complaint  Patient presents with  . Hyperglycemia    HPI [name] [name] is a 32 y.o. male.  Pt presents to the ED today with elevated blood sugar.  The pt has a hx of type 1 diabetes and has had elevated blood sugar for the past few days.  The pt has a hx of poorly controlled blood sugar.  During his last admission (2/8-10) for DKA, he refused to eat a diabetic diet.  He said since his discharge he has been trying to eat better.  He said he's been checking his blood sugar 3 times per day.  The pt has been vomiting for the past 24 hours and his blood sugar has been reading high.     Past Medical History:  Diagnosis Date  . ARF (acute renal failure) ([place]) 2011   pt was on dialysis "for 1 day approx 2011, pt is no longer on dialysis (03/09/2017)  . DKA (diabetic ketoacidoses) ([place])    "several times" (03/09/2017)  . Type I diabetes mellitus [facility]) Dx 2012    Patient Active Problem List   Diagnosis Date Noted  . Diabetes mellitus type 1, uncontrolled, with complications ([place])   . Noncompliance by refusing service   . DKA (diabetic ketoacidoses) ([place]) 06/29/2017  . DKA, type 1 ([place]) 03/09/2017  . Hypophosphatemia 08/27/2016  . AKI (acute kidney injury) ([place]) 07/11/2016  . Hyperkalemia 03/09/2016  . Renal insufficiency 03/09/2016  . Leukocytosis 03/09/2016  . Diabetic ketoacidosis without coma associated with type 1 diabetes mellitus ([place])   . Non-intractable vomiting with nausea   . Chest pain 12/18/2015  . Marijuana use 07/22/2015  . Proteinuria with type 1 diabetes mellitus ([place]) 07/22/2015  . High blood pressure 07/22/2015  . Uncontrolled type 1 diabetes mellitus with ketoacidosis without coma ([place])   . Hypocalcemia   . Acute renal failure ([place])   . Hypokalemia   . Hypomagnesemia   . Abnormal EKG 06/15/2015  . Sinus  tachycardia   . Tachypnea   . Tinea pedis 12/17/2014  . Adjustment disorder with mixed anxiety and depressed mood 09/03/2014  . Protein-calorie malnutrition, severe ([place]) 07/06/2014  . DM (diabetes mellitus), type 1, uncontrolled ([place]) 05/01/2014  . HLD (hyperlipidemia) 04/26/2014  . Substance abuse ([place])   . Dehydration 07/07/2013  . Fatigue 03/10/2012    Past Surgical History:  Procedure Laterality Date  . NO PAST SURGERIES          Home Medications    Prior to Admission medications   Medication Sig Start Date End Date Taking? Authorizing Provider  Blood Glucose Monitoring Suppl (TRUE METRIX METER) w/Device KIT 1 each by Does not apply route as needed. 07/12/16  Yes Florencia Reasons, MD  insulin aspart (NOVOLOG) 100 UNIT/ML injection Inject 15 Units into the skin 3 (three) times daily before meals.   Yes [provider]  Insulin Glargine (LANTUS) 100 UNIT/ML Solostar Pen Inject 13 Units into the skin daily at 10 pm. 07/01/17  Yes Allie Bossier, MD  metoCLOPramide (REGLAN) 5 MG tablet Take 1 tablet (5 mg total) by mouth every 6 (six) hours as needed for nausea. Patient not taking: Reported on 06/29/2017 03/12/17   Geradine Girt, DO  ondansetron (ZOFRAN) 4 MG tablet Take 1 tablet (4 mg total) by mouth daily as needed for nausea or vomiting. Patient not taking:  Reported on 06/29/2017 03/12/17 03/12/18  Geradine Girt, DO    Family History Family History  Problem Relation Age of Onset  . Diabetes Father   . Diabetes Maternal Grandmother   . Heart disease Neg Hx   . Hypertension Neg Hx     Social History Social History   Tobacco Use  . Smoking status: Former Smoker    Packs/day: 0.25    Years: 10.00    Pack years: 2.50    Types: Cigarettes    Last attempt to quit: 09/19/2013    Years since quitting: 3.9  . Smokeless tobacco: Never Used  Substance Use Topics  . Alcohol use: Yes    Alcohol/week: 0.0 oz    Comment: 03/09/2017 "normally I don't drink; drank 3 shots  liquor 2 days ago"  . Drug use: No     Allergies   Patient has no known allergies.   Review of Systems Review of Systems  Gastrointestinal: Positive for nausea and vomiting.  Endocrine:       Elevated blood sugar  All other systems reviewed and are negative.    Physical Exam Updated Vital Signs BP (!) 141/103   Pulse (!) 102   Temp 98 F (36.7 C) (Oral)   Resp 16   Ht _0  (1.676 m)   Wt 54.4 kg (120 lb)   SpO2 98%   BMI 19.37 kg/m   Physical Exam  Constitutional: He is oriented to person, place, and time. He appears well-developed and well-nourished.  HENT:  Head: Normocephalic and atraumatic.  Right Ear: External ear normal.  Left Ear: External ear normal.  Nose: Nose normal.  Mouth/Throat: Mucous membranes are dry.  Eyes: Pupils are equal, round, and reactive to light. Conjunctivae and EOM are normal.  Neck: Normal range of motion. Neck supple.  Cardiovascular: Normal rate, regular rhythm, normal heart sounds and intact distal pulses.  Pulmonary/Chest: Effort normal and breath sounds normal.  Abdominal: Soft. Bowel sounds are normal.  Musculoskeletal: Normal range of motion.  Neurological: He is alert and oriented to person, place, and time.  Skin: Skin is warm. Capillary refill takes less than 2 seconds.  Psychiatric: He has a normal mood and affect. His behavior is normal. Judgment and thought content normal.  Nursing note and vitals reviewed.    ED Treatments / Results  Labs (all labs ordered are listed, but only abnormal results are displayed) Labs Reviewed  BASIC METABOLIC PANEL - Abnormal; Notable for the following components:      Result Value   Sodium 134 (*)    Chloride 95 (*)    CO2 18 (*)    Glucose, Bld 341 (*)    BUN 26 (*)    Anion gap 21 (*)    All other components within normal limits  BLOOD GAS, VENOUS - Abnormal; Notable for the following components:   pCO2, Ven 33.8 (*)    pO2, Ven 56.9 (*)    Bicarbonate 15.9 (*)     Acid-base deficit 9.3 (*)    All other components within normal limits  URINALYSIS, ROUTINE W REFLEX MICROSCOPIC - Abnormal; Notable for the following components:   Glucose, UA >=500 (*)    Ketones, ur 80 (*)    Protein, ur 30 (*)    All other components within normal limits  CBG MONITORING, ED - Abnormal; Notable for the following components:   Glucose-Capillary 328 (*)    All other components within normal limits  I-STAT CHEM 8, ED - Abnormal;  Notable for the following components:   Sodium 134 (*)    Chloride 97 (*)    BUN 23 (*)    Creatinine, Ser 0.60 (*)    Glucose, Bld 347 (*)    Calcium, Ion 1.10 (*)    TCO2 21 (*)    All other components within normal limits  CBG MONITORING, ED - Abnormal; Notable for the following components:   Glucose-Capillary 263 (*)    All other components within normal limits  CBC WITH DIFFERENTIAL/PLATELET  CBG MONITORING, ED  CBG MONITORING, ED    EKG EKG Interpretation  Date/Time:  Tuesday August 21 2017 08:46:44 EDT Ventricular Rate:  97 PR Interval:    QRS Duration: 78 QT Interval:  349 QTC Calculation: 444 R Axis:   86 Text Interpretation:  Sinus rhythm Anteroseptal infarct, age indeterminate Minimal ST elevation, inferior leads Baseline wander in lead(s) V3 No significant change since last tracing Confirmed by Isla Pence [id number]) on 08/21/2017 9:32:44 AM   Radiology Dg Chest 2 View  Result Date: 08/21/2017 CLINICAL DATA:  Nausea and vomiting.  Weakness. EXAM: CHEST - 2 VIEW COMPARISON:  06/29/2017. FINDINGS: The heart size and mediastinal contours are within normal limits. Both lungs are clear. The visualized skeletal structures are unremarkable. IMPRESSION: No active cardiopulmonary disease.  Stable exam. Electronically Signed   By: Staci Righter M.D.   On: 08/21/2017 09:03    Procedures Procedures (including critical care time)  Medications Ordered in ED Medications  sodium chloride 0.9 % bolus 1,000 mL (0 mLs Intravenous  Stopped 08/21/17 1001)    And  0.9 %  sodium chloride infusion (has no administration in time range)  sodium chloride 0.9 % bolus 1,000 mL (has no administration in time range)  dextrose 5 %-0.45 % sodium chloride infusion (has no administration in time range)  potassium chloride 10 mEq in 100 mL IVPB (has no administration in time range)  insulin regular (NOVOLIN R,HUMULIN R) 100 Units in sodium chloride 0.9 % 100 mL (1 Units/mL) infusion (has no administration in time range)  sodium chloride 0.9 % bolus 1,000 mL (0 mLs Intravenous Stopped 08/21/17 0907)  ondansetron (ZOFRAN) injection 4 mg (4 mg Intravenous Given 08/21/17 0834)     Initial Impression / Assessment and Plan / ED Course  I have reviewed the triage vital signs and the nursing notes.  Pertinent labs & imaging results that were available during my care of the patient were reviewed by me and considered in my medical decision making (see chart for details).  Pt given IVFs and started on an insulin drip.  Pt is feeling a little better, but still weak.  Pt d/w Dr. Rodena Piety ([facility]) for admission.  CRITICAL CARE Performed by: Isla Pence   Total critical care time: 30 minutes  Critical care time was exclusive of separately billable procedures and treating other patients.  Critical care was necessary to treat or prevent imminent or life-threatening deterioration.  Critical care was time spent personally by me on the following activities: development of treatment plan with patient and/or surrogate as well as nursing, discussions with consultants, evaluation of patient's response to treatment, examination of patient, obtaining history from patient or surrogate, ordering and performing treatments and interventions, ordering and review of laboratory studies, ordering and review of radiographic studies, pulse oximetry and re-evaluation of patient's condition.  Final Clinical Impressions(s) / ED Diagnoses   Final diagnoses:  Diabetic  ketoacidosis without coma associated with type 1 diabetes mellitus (HCC)  Dehydration  Non-intractable vomiting  with nausea, unspecified vomiting type    ED Discharge Orders    None       Isla Pence, MD 08/21/17 1050

## 2017-08-22 DIAGNOSIS — E860 Dehydration: Secondary | ICD-10-CM

## 2017-08-22 DIAGNOSIS — E10649 Type 1 diabetes mellitus with hypoglycemia without coma: Secondary | ICD-10-CM

## 2017-08-22 DIAGNOSIS — R112 Nausea with vomiting, unspecified: Secondary | ICD-10-CM

## 2017-08-22 DIAGNOSIS — E876 Hypokalemia: Secondary | ICD-10-CM

## 2017-08-22 DIAGNOSIS — E7849 Other hyperlipidemia: Secondary | ICD-10-CM

## 2017-08-22 DIAGNOSIS — I10 Essential (primary) hypertension: Secondary | ICD-10-CM

## 2017-08-22 LAB — CBC
HEMATOCRIT: 40.5 % (ref 39.0–52.0)
Hemoglobin: 13.9 g/dL (ref 13.0–17.0)
MCH: 30 pg (ref 26.0–34.0)
MCHC: 34.3 g/dL (ref 30.0–36.0)
MCV: 87.5 fL (ref 78.0–100.0)
PLATELETS: 282 10*3/uL (ref 150–400)
RBC: 4.63 MIL/uL (ref 4.22–5.81)
RDW: 12.5 % (ref 11.5–15.5)
WBC: 6.6 10*3/uL (ref 4.0–10.5)

## 2017-08-22 LAB — BASIC METABOLIC PANEL
ANION GAP: 9 (ref 5–15)
BUN: 16 mg/dL (ref 6–20)
CALCIUM: 8.4 mg/dL — AB (ref 8.9–10.3)
CO2: 23 mmol/L (ref 22–32)
Chloride: 101 mmol/L (ref 101–111)
Creatinine, Ser: 0.59 mg/dL — ABNORMAL LOW (ref 0.61–1.24)
Glucose, Bld: 249 mg/dL — ABNORMAL HIGH (ref 65–99)
POTASSIUM: 3.6 mmol/L (ref 3.5–5.1)
SODIUM: 133 mmol/L — AB (ref 135–145)

## 2017-08-22 LAB — GLUCOSE, CAPILLARY
GLUCOSE-CAPILLARY: 165 mg/dL — AB (ref 65–99)
GLUCOSE-CAPILLARY: 222 mg/dL — AB (ref 65–99)
GLUCOSE-CAPILLARY: 94 mg/dL (ref 65–99)
Glucose-Capillary: 62 mg/dL — ABNORMAL LOW (ref 65–99)
Glucose-Capillary: 95 mg/dL (ref 65–99)
Glucose-Capillary: 129 mg/dL — ABNORMAL HIGH (ref 65–99)
Glucose-Capillary: 135 mg/dL — ABNORMAL HIGH (ref 65–99)

## 2017-08-22 LAB — HEMOGLOBIN A1C
Hgb A1c MFr Bld: 12.6 % — ABNORMAL HIGH (ref 4.8–5.6)
Mean Plasma Glucose: 314.92 mg/dL

## 2017-08-22 LAB — TROPONIN I: Troponin I: <0.03 ng/mL (ref <0.03)

## 2017-08-22 LAB — MAGNESIUM: MAGNESIUM: 1.9 mg/dL (ref 1.7–2.4)

## 2017-08-22 MED ORDER — PANTOPRAZOLE SODIUM 40 MG PO TBEC
40.0000 mg | DELAYED_RELEASE_TABLET | Freq: Every day | ORAL | Status: DC
  Administered 2017-08-22 – 2017-08-23 (×2): 40 mg via ORAL
  Filled 2017-08-22 (×2): qty 1

## 2017-08-22 MED ORDER — LISINOPRIL 5 MG PO TABS
5.0000 mg | ORAL_TABLET | Freq: Every day | ORAL | Status: DC
  Administered 2017-08-22 – 2017-08-23 (×2): 5 mg via ORAL
  Filled 2017-08-22: qty 2
  Filled 2017-08-22: qty 1
  Filled 2017-08-22: qty 2

## 2017-08-22 MED ORDER — SODIUM CHLORIDE 0.9 % IV SOLN
INTRAVENOUS | Status: DC
  Administered 2017-08-22: 12:00:00 via INTRAVENOUS

## 2017-08-22 NOTE — Progress Notes (Signed)
PROGRESS NOTE    [name] [name]  ZOX:[id number] DOB: [birth year] DOA: 08/21/2017 PCP: Dessa Phi, MD    Brief Narrative:  Patient is a 32 year old gentleman history of uncontrolled type 1 diabetes presented to the ED with ongoing nausea vomiting times 1-2 days.  Patient stated symptoms started after eating dinner in the next day had nausea vomiting with dehydration.  Patient denied any urinary complaints.  Patient stated had been compliant with his insulin and watching his diet.  Denies any hemoptysis.  Last hospitalization July 01, 2017 per admitting physician when he threatened to leave AMA.  Patient seen in the ED ABG with a pH of 7.29, PCO2 of 33, bicarb of 15.  UA was nitrite negative leukocytes negative with 80 ketones.  Sodium at 134, potassium 3.6, BUN 23, creatinine 0.60, WBC of 7.6, hemoglobin 15.6 platelet count of 285.  Blood pressure noted on admission was 145/101 with a pulse of 104 respirations 16 sats 97% on room air.  Patient placed on the glucose stabilizer admitted to the stepdown unit hydrated with IV fluids with serial BMETs   Assessment & Plan:   Principal Problem:   DKA (diabetic ketoacidoses) (HCC) Active Problems:   Dehydration   HLD (hyperlipidemia)   DM (diabetes mellitus), type 1, uncontrolled (HCC)   Hypokalemia   High blood pressure   Non-intractable vomiting with nausea  #1 DKA/type 1 diabetes uncontrolled Question of will etiology for onset of patient's DKA.  She states he been compliant with his medications.  Patient stated he ate something the night prior to onset of his symptoms.  Patient currently afebrile.  No signs or symptoms of infection.  Chest x-ray obtained this morning negative for any acute infiltrate.  Urinalysis was nitrite negative leukocytes negative.  Patient hydrated aggressively with IV fluids.  On admission patient noted to have a pH of 7.29 with a bicarb of 15 and an anion gap of 21.  Anion gap is closed.  Patient transitioned to  Lantus and currently on sliding scale insulin.  Troponin I pending.  Check a hemoglobin A1c.  Place on a clear liquid diet and advance as tolerated to a carb modified diet.  Continue current home dose of Lantus.  Sliding scale insulin.  As diet improved and he is on a carb modified diet will start meal coverage insulin.  Consult with diabetic coordinator for diabetes education.  2.  Dehydration Improved with hydration.  Continue IV fluids for the next 24 hours.  3.  Hypokalemia Repleted.  4.  High blood pressure Patient not on any antihypertensive medications at this time.  Will start low-dose lisinopril at 5 mg daily and uptitrate as needed.  5.  Non-intractable nausea and vomiting Secondary to problem #1.  Place on clear liquids and advance diet as tolerated to a carb modified diet.  If patient with continued nausea and vomiting may need a gastric emptying study to rule out gastroparesis.  Follow.  6.  History of hyperlipidemia Check a fasting lipid panel.   DVT prophylaxis: Lovenox Code Status: Full Family Communication: Updated patient.  No family at bedside. Disposition Plan: Transfer to MedSurg.  Likely home with continued clinical improvement and when tolerating a solid diet hopefully in the next 24-48 hours.   Consultants:   None  Procedures:   Chest x-ray 08/22/2017    Antimicrobials:   None   Subjective: Patient laying in bed denies any further emesis.  Feels his stomach is bubbly.  No chest pain.  No shortness of breath.  No dysuria.  Patient stated he ate something the night prior to onset of his symptoms and then started having nausea and vomiting with dehydration.  Objective: Vitals:   08/22/17 0000 08/22/17 0100 08/22/17 0404 08/22/17 0800  BP: (!) 160/103 (!) 156/102    Pulse:      Resp: 20 14  16   Temp:   98.3 F (36.8 C) 98.7 F (37.1 C)  TempSrc:   Oral Oral  SpO2: 98% 97%  98%  Weight:   48.7 kg (107 lb 5.8 oz)   Height:        Intake/Output  Summary (Last 24 hours) at 08/22/2017 1013 Last data filed at 08/22/2017 0500 Gross per 24 hour  Intake 3292.39 ml  Output 800 ml  Net 2492.39 ml   Filed Weights   08/21/17 0757 08/22/17 0404  Weight: 54.4 kg (120 lb) 48.7 kg (107 lb 5.8 oz)    Examination:  General exam: Appears calm and comfortable  Respiratory system: Clear to auscultation. Respiratory effort normal. Cardiovascular system: S1 & S2 heard, RRR. No JVD, murmurs, rubs, gallops or clicks. No pedal edema. Gastrointestinal system: Abdomen is nondistended, soft and nontender. No organomegaly or masses felt. Normal bowel sounds heard. Central nervous system: Alert and oriented. No focal neurological deficits. Extremities: Symmetric 5 x 5 power. Skin: No rashes, lesions or ulcers Psychiatry: Judgement and insight appear normal. Mood & affect appropriate.     Data Reviewed: I have personally reviewed following labs and imaging studies  CBC: Recent Labs  Lab 08/21/17 0804 08/21/17 0828 08/22/17 0321  WBC 7.6  --  6.6  NEUTROABS 6.1  --   --   HGB 15.6 16.0 13.9  HCT 44.4 47.0 40.5  MCV 87.1  --  87.5  PLT 285  --  282   Basic Metabolic Panel: Recent Labs  Lab 08/21/17 0804 08/21/17 0828 08/21/17 1409 08/21/17 1810 08/21/17 2202 08/22/17 0818  NA 134* 134* 136 136 138 133*  K 3.6 3.6 3.8 3.5 3.1* 3.6  CL 95* 97* 103 104 107 101  CO2 18*  --  15* 18* 23 23  GLUCOSE 341* 347* 242* 240* 116* 249*  BUN 26* 23* 20 19 17 16   CREATININE 0.83 0.60* 0.72 0.68 0.56* 0.59*  CALCIUM 9.3  --  8.4* 8.2* 8.4* 8.4*  MG 1.9  --   --   --   --  1.9  PHOS 3.5  --   --   --   --   --    GFR: Estimated Creatinine Clearance: 90.5 mL/min (A) (by C-G formula based on SCr of 0.59 mg/dL (L)). Liver Function Tests: No results for input(s): AST, ALT, ALKPHOS, BILITOT, PROT, ALBUMIN in the last 168 hours. No results for input(s): LIPASE, AMYLASE in the last 168 hours. No results for input(s): AMMONIA in the last 168  hours. Coagulation Profile: No results for input(s): INR, PROTIME in the last 168 hours. Cardiac Enzymes: Recent Labs  Lab 08/22/17 0818  TROPONINI <0.03   BNP (last 3 results) No results for input(s): PROBNP in the last 8760 hours. HbA1C: No results for input(s): HGBA1C in the last 72 hours. CBG: Recent Labs  Lab 08/21/17 2351 08/22/17 0057 08/22/17 0358 08/22/17 0444 08/22/17 0757  GLUCAP 122* 94 62* 95 222*   Lipid Profile: No results for input(s): CHOL, HDL, LDLCALC, TRIG, CHOLHDL, LDLDIRECT in the last 72 hours. Thyroid Function Tests: No results for input(s): TSH, T4TOTAL, FREET4, T3FREE, THYROIDAB in the last 72 hours. Anemia  Panel: No results for input(s): VITAMINB12, FOLATE, FERRITIN, TIBC, IRON, RETICCTPCT in the last 72 hours. Sepsis Labs: No results for input(s): PROCALCITON, LATICACIDVEN in the last 168 hours.  Recent Results (from the past 240 hour(s))  MRSA PCR Screening     Status: None   Collection Time: 08/21/17  2:01 PM  Result Value Ref Range Status   MRSA by PCR NEGATIVE NEGATIVE Final    Comment:        The GeneXpert MRSA Assay (FDA approved for NASAL specimens only), is one component of a comprehensive MRSA colonization surveillance program. It is not intended to diagnose MRSA infection nor to guide or monitor treatment for MRSA infections. Performed at [facility], [address] [place], [place] [address]          Radiology Studies: Dg Chest 2 View  Result Date: 08/21/2017 CLINICAL DATA:  Nausea and vomiting.  Weakness. EXAM: CHEST - 2 VIEW COMPARISON:  06/29/2017. FINDINGS: The heart size and mediastinal contours are within normal limits. Both lungs are clear. The visualized skeletal structures are unremarkable. IMPRESSION: No active cardiopulmonary disease.  Stable exam. Electronically Signed   By: Elsie Stain M.D.   On: 08/21/2017 09:03        Scheduled Meds: . enoxaparin (LOVENOX) injection  40 mg  Subcutaneous Q24H  . insulin aspart  0-5 Units Subcutaneous QHS  . insulin aspart  0-9 Units Subcutaneous TID WC  . insulin glargine  13 Units Subcutaneous QHS  . pantoprazole  40 mg Oral Q0600   Continuous Infusions: . sodium chloride Stopped (08/22/17 0500)  . insulin (NOVOLIN-R) infusion Stopped (08/22/17 0058)     LOS: 1 day    Time spent: 35 mins    Ramiro Harvest, MD [facility] Pager [phone]  If 7PM-7AM, please contact night-coverage [email or web address] Password [email or web address] 08/22/2017, 10:13 AM

## 2017-08-22 NOTE — Progress Notes (Signed)
Inpatient Diabetes Program Recommendations  AACE/ADA: New Consensus Statement on Inpatient Glycemic Control (2015)  Target Ranges:  Prepandial:   less than 140 mg/dL      Peak postprandial:   less than 180 mg/dL (1-2 hours)      Critically ill patients:  140 - 180 mg/dL   Lab Results  Component Value Date   GLUCAP 165 (H) 08/22/2017   HGBA1C 12.6 (H) 08/22/2017    Review of Glycemic Control  Diabetes history: DM1 Outpatient Diabetes medications: Lantus 13 units QHS, Novolog 15 units tidwc Current orders for Inpatient glycemic control: Lantus 13 units QHS, Novolog 0-9 units tidwc and hs  HgbA1C - 12.6% - uncontrolled 3rd admission in past 6 months for DKA  [facility] - almost a year ago.  Inpatient Diabetes Program Recommendations:     When po intake advances, add Novolog 4 units tidwc for meal coverage insulin.  Will speak again to pt regarding no PCP visit in the past year - gets prescriptions from ED visits.   Discussed with MD. Follow.  Thank you. Ailene Ardshonda Jaxton Casale, RD, LDN, CDE Inpatient Diabetes Coordinator [phone]

## 2017-08-23 LAB — GLUCOSE, CAPILLARY
GLUCOSE-CAPILLARY: 78 mg/dL (ref 65–99)
GLUCOSE-CAPILLARY: 156 mg/dL — AB (ref 65–99)
Glucose-Capillary: 50 mg/dL — ABNORMAL LOW (ref 65–99)

## 2017-08-23 LAB — BASIC METABOLIC PANEL
Anion gap: 9 (ref 5–15)
BUN: 11 mg/dL (ref 6–20)
CO2: 24 mmol/L (ref 22–32)
CREATININE: 0.52 mg/dL — AB (ref 0.61–1.24)
Calcium: 8.8 mg/dL — ABNORMAL LOW (ref 8.9–10.3)
Chloride: 104 mmol/L (ref 101–111)
GFR calc Af Amer: >60 mL/min (ref >60)
GLUCOSE: 79 mg/dL (ref 65–99)
POTASSIUM: 2.9 mmol/L — AB (ref 3.5–5.1)
Sodium: 137 mmol/L (ref 135–145)

## 2017-08-23 LAB — LIPID PANEL
CHOL/HDL RATIO: 3.3 ratio
CHOLESTEROL: 193 mg/dL (ref 0–200)
HDL: 59 mg/dL (ref >40)
LDL Cholesterol: 115 mg/dL — ABNORMAL HIGH (ref 0–99)
TRIGLYCERIDES: 96 mg/dL (ref <150)
VLDL: 19 mg/dL (ref 0–40)

## 2017-08-23 LAB — CBC
HCT: 42.7 % (ref 39.0–52.0)
Hemoglobin: 15 g/dL (ref 13.0–17.0)
MCH: 30.2 pg (ref 26.0–34.0)
MCHC: 35.1 g/dL (ref 30.0–36.0)
MCV: 85.9 fL (ref 78.0–100.0)
PLATELETS: 236 10*3/uL (ref 150–400)
RBC: 4.97 MIL/uL (ref 4.22–5.81)
RDW: 12.2 % (ref 11.5–15.5)
WBC: 5.1 10*3/uL (ref 4.0–10.5)

## 2017-08-23 MED ORDER — LISINOPRIL 5 MG PO TABS: 5.0000 mg | ORAL_TABLET | Freq: Every day | ORAL | 0 refills | Status: DC

## 2017-08-23 MED ORDER — POTASSIUM CHLORIDE CRYS ER 20 MEQ PO TBCR
40.0000 meq | EXTENDED_RELEASE_TABLET | ORAL | Status: AC
  Administered 2017-08-23 (×2): 40 meq via ORAL
  Filled 2017-08-23 (×2): qty 2

## 2017-08-23 MED ORDER — INSULIN GLARGINE 100 UNIT/ML ~~LOC~~ SOLN
10.0000 [IU] | Freq: Every day | SUBCUTANEOUS | Status: DC
  Filled 2017-08-23: qty 0.1

## 2017-08-23 MED ORDER — PANTOPRAZOLE SODIUM 40 MG PO TBEC: 40.0000 mg | DELAYED_RELEASE_TABLET | Freq: Every day | ORAL | 0 refills | Status: DC

## 2017-08-23 NOTE — Progress Notes (Signed)
Pt verbalized understanding of d/c instructions and and scripts to have filled. D/c  Home via a city bus

## 2017-08-23 NOTE — Progress Notes (Signed)
Inpatient Diabetes Program Recommendations  AACE/ADA: New Consensus Statement on Inpatient Glycemic Control (2015)  Target Ranges:  Prepandial:   less than 140 mg/dL      Peak postprandial:   less than 180 mg/dL (1-2 hours)      Critically ill patients:  140 - 180 mg/dL   Lab Results  Component Value Date   GLUCAP 78 08/23/2017   HGBA1C 12.6 (H) 08/22/2017    Review of Glycemic Control  Hypo this am. Adjust Lantus  Inpatient Diabetes Program Recommendations:     Decrease Lantus to 10 units QHS  Continue to follow.  Thank you. Ailene Ardshonda Ramaj Frangos, RD, LDN, CDE Inpatient Diabetes Coordinator [phone]

## 2017-08-23 NOTE — Progress Notes (Signed)
  Hypoglycemic Event  CBG: 50  Treatment: 15 GM carbohydrate snack (apple juice x3)  Symptoms: None  Follow-up CBG: [id number] CBG Result:78 (breakfast arrived after CBG of 78 was taken)  Possible Reasons for Event: Unknown (pt. Concerned because CBG was 62 yesterday)  Comments/MD notified: Dr. Christeen Douglashompson    Ellen Goris, Joslyn DevonHilary Kelly

## 2017-08-23 NOTE — Discharge Summary (Signed)
Physician Discharge Summary  [name] [name] URK:[id number] DOB: 1986/05/03 DOA: 08/21/2017  PCP: [name] Nearing, MD  Admit date: 08/21/2017 Discharge date: 08/23/2017  Time spent: 50 minutes  Recommendations for Outpatient Follow-up:  1. [facility].  On follow-up patient will need a basic metabolic profile done to follow-up on electrolytes and renal function.  Patient's blood pressure need to be reassessed as patient has been started on lisinopril 5 mg daily.  Patient's diabetes will also need to be reassessed as well.   Discharge Diagnoses:  Principal Problem:   DKA (diabetic ketoacidoses) ([name]) Active Problems:   Dehydration   HLD (hyperlipidemia)   DM (diabetes mellitus), type 1, uncontrolled (HCC)   Hypokalemia   High blood pressure   Non-intractable vomiting with nausea   Discharge Condition: Stable and improved  Diet recommendation: Carb modified diet  Filed Weights   08/21/17 0757 08/22/17 0404  Weight: 54.4 kg (120 lb) 48.7 kg (107 lb 5.8 oz)    History of present illness:  Per [name] is a 32 y.o. male with medical history significant of uncontrolled type 1 diabetes came in with complaints of ongoing nausea and vomiting for the last 24 hours.  Denied any diarrhea fever chills chest pain shortness of breath or cough.  No urinary complaints.  He reported that he was taking his insulin as prescribed and was watching his diet.  He denied any blood in his vomitus.  His last hospital admission was July 01, 2017 when he threatened to leave AMA.   ED Course: His ABG pH 7.29 PCO2 is 33 bicarbonate is 15.  UA was clear no evidence of leukocytes or nitrites 80 ketones present.  Sodium 134 potassium 3.6 BUN 23 creatinine 0.60 WBC count is 7.6 hemoglobin 15.6 platelet count 285.  Blood pressure 145 101 pulse 104 respiration 16 temperature 97% on room air.  In the ED he received IV fluids started on an insulin  drip. Review of Systems:     Hospital Course:  #1 DKA/type 1 diabetes uncontrolled Question etiology for onset of patient's DKA.  Patient stated he had been compliant with his medications.  Patient stated he ate something the night prior to onset of his symptoms.  Patient remained afebrile.  No signs or symptoms of infection.  Chest x-ray obtained was negative for any acute infiltrate.  Urinalysis was nitrite negative leukocytes negative.  Patient hydrated aggressively with IV fluids.  On admission patient noted to have a pH of 7.29 with a bicarb of 15 and an anion gap of 21.  Patient was placed on the glucose stabilizer hydrated aggressively with IV fluids and monitored.  Patient had serial BMETs obtained.  Anion gap closed. Patient transitioned to Lantus and sliding scale insulin.  Troponin I obtained was negative.  Globin A1c obtained was 12.6.  Patient was started on a clear liquid diet and subsequently advanced to a carb modified diet which he tolerated.  Patient was placed back on home dose Lantus at 13 units daily as well as sliding scale insulin.  While on a full liquid diet patient was noted to have a CBG of 50 on the morning of discharge however was asymptomatic and improved with juice.  Patient has subsequently been advanced to a carb modified diet which is tolerated.  With advancement of patient's diet patient will be discharged back on home regimen of Lantus at 13 units daily as well as sliding scale insulin.  Patient was seen by diabetic  coordinator for diabetes education.  Patient will follow up with PCP in the outpatient setting.    2.  Dehydration Patient noted to be dehydrated during the hospitalization.  Patient was hydrated with IV fluids and was euvolemic by day of discharge.  3.  Hypokalemia Repleted.  4.  High blood pressure Patient not on any antihypertensive medications at this time.  She started on low-dose lisinopril at 5 mg daily.  Outpatient follow-up.  5.   Non-intractable nausea and vomiting Secondary to problem #1.  Once DKA was resolved patient was started on a clear liquid diet which is tolerating and diet advanced to a carb modified diet.  Patient had no further nausea or vomiting and will be discharged in stable and improved condition.  Outpatient follow-up.   6.  History of hyperlipidemia Fasting lipid panel which was done with total cholesterol of 193, LDL 115.  Outpatient follow-up.    Procedures:  Chest x-ray 08/22/2017      Consultations:  None  Discharge Exam: Vitals:   08/22/17 2106 08/23/17 1400  BP: 127/90 (!) 112/97  Pulse: 80 87  Resp: 16 16  Temp: 98 F (36.7 C) 98.3 F (36.8 C)  SpO2: 100% 100%    General: NAD Cardiovascular: RRR Respiratory: CTAB  Discharge Instructions   Discharge Instructions    Diet Carb Modified   Complete by:  As directed    Increase activity slowly   Complete by:  As directed      Allergies as of 08/23/2017   No Known Allergies     Medication List    STOP taking these medications   metoCLOPramide 5 MG tablet Commonly known as:  REGLAN   ondansetron 4 MG tablet Commonly known as:  ZOFRAN     TAKE these medications   insulin aspart 100 UNIT/ML injection Commonly known as:  novoLOG Inject 15 Units into the skin 3 (three) times daily before meals.   Insulin Glargine 100 UNIT/ML Solostar Pen Commonly known as:  LANTUS Inject 13 Units into the skin daily at 10 pm.   lisinopril 5 MG tablet Commonly known as:  PRINIVIL,ZESTRIL Take 1 tablet (5 mg total) by mouth daily. Start taking on:  08/24/2017   pantoprazole 40 MG tablet Commonly known as:  PROTONIX Take 1 tablet (40 mg total) by mouth daily at 6 (six) AM. Start taking on:  08/24/2017   TRUE METRIX METER w/Device Kit 1 each by Does not apply route as needed.      No Known Allergies Follow-up Information    [facility]. Schedule an appointment as soon as possible for a visit  in 1 week(s).   Why:  Call at 8:30am Monday morning to make appointment for a hospital follow up, in 1-2 weeks Contact information: [address] [place] [address] [phone]           The results of significant diagnostics from this hospitalization (including imaging, microbiology, ancillary and laboratory) are listed below for reference.    Significant Diagnostic Studies: Dg Chest 2 View  Result Date: 08/21/2017 CLINICAL DATA:  Nausea and vomiting.  Weakness. EXAM: CHEST - 2 VIEW COMPARISON:  06/29/2017. FINDINGS: The heart size and mediastinal contours are within normal limits. Both lungs are clear. The visualized skeletal structures are unremarkable. IMPRESSION: No active cardiopulmonary disease.  Stable exam. Electronically Signed   By: Staci Righter M.D.   On: 08/21/2017 09:03    Microbiology: Recent Results (from the past 240 hour(s))  MRSA PCR Screening     Status: None   Collection Time: 08/21/17  2:01 PM  Result Value Ref Range Status   MRSA by PCR NEGATIVE NEGATIVE Final    Comment:        The GeneXpert MRSA Assay (FDA approved for NASAL specimens only), is one component of a comprehensive MRSA colonization surveillance program. It is not intended to diagnose MRSA infection nor to guide or monitor treatment for MRSA infections. Performed at [facility], [place] [address], [place], [place] [address]      Labs: Basic Metabolic Panel: Recent Labs  Lab 08/21/17 0804  08/21/17 1409 08/21/17 1810 08/21/17 2202 08/22/17 0818 08/23/17 0414  NA 134*   < > 136 136 138 133* 137  K 3.6   < > 3.8 3.5 3.1* 3.6 2.9*  CL 95*   < > 103 104 107 101 104  CO2 18*  --  15* 18* '23 23 24  '$ GLUCOSE 341*   < > 242* 240* 116* 249* 79  BUN 26*   < > '20 19 17 16 11  '$ CREATININE 0.83   < > 0.72 0.68 0.56* 0.59* 0.52*  CALCIUM 9.3  --  8.4* 8.2* 8.4* 8.4* 8.8*  MG 1.9  --   --   --   --  1.9  --   PHOS 3.5  --   --   --   --   --   --     < > = values in this interval not displayed.   Liver Function Tests: No results for input(s): AST, ALT, ALKPHOS, BILITOT, PROT, ALBUMIN in the last 168 hours. No results for input(s): LIPASE, AMYLASE in the last 168 hours. No results for input(s): AMMONIA in the last 168 hours. CBC: Recent Labs  Lab 08/21/17 0804 08/21/17 0828 08/22/17 0321 08/23/17 0414  WBC 7.6  --  6.6 5.1  NEUTROABS 6.1  --   --   --   HGB 15.6 16.0 13.9 15.0  HCT 44.4 47.0 40.5 42.7  MCV 87.1  --  87.5 85.9  PLT 285  --  282 236   Cardiac Enzymes: Recent Labs  Lab 08/22/17 0818  TROPONINI <0.03   BNP: BNP (last 3 results) No results for input(s): BNP in the last 8760 hours.  ProBNP (last 3 results) No results for input(s): PROBNP in the last 8760 hours.  CBG: Recent Labs  Lab 08/22/17 1639 08/22/17 2102 08/23/17 0817 08/23/17 0844 08/23/17 1141  GLUCAP 129* 135* 50* 78 156*       Signed:  Irine Seal MD.  [facility] 08/23/2017, 2:59 PM

## 2017-08-23 NOTE — Progress Notes (Signed)
This CM attempted to call and make pt a hospital [facility] appointment [facility]. This CM was informed that I should have pt call at 8:30am Monday morning to schedule an appointment. They were unable to give me an appointment time. Sandford Crazeora Tashi Band RN,BSN,NCM [phone]

## 2017-09-20 ENCOUNTER — Inpatient Hospital Stay (HOSPITAL_COMMUNITY)
Admission: EM | Admit: 2017-09-20 | Discharge: 2017-09-22 | DRG: 637 | Discharge status: Against Medical Advice | Payer: Self-pay | Attending: Internal Medicine | Admitting: Internal Medicine

## 2017-09-20 ENCOUNTER — Other Ambulatory Visit: Payer: Self-pay

## 2017-09-20 ENCOUNTER — Encounter (HOSPITAL_COMMUNITY): Payer: Self-pay

## 2017-09-20 ENCOUNTER — Emergency Department (HOSPITAL_COMMUNITY): Payer: Self-pay

## 2017-09-20 DIAGNOSIS — E119 Type 2 diabetes mellitus without complications: Secondary | ICD-10-CM | POA: Diagnosis present

## 2017-09-20 DIAGNOSIS — E876 Hypokalemia: Secondary | ICD-10-CM | POA: Diagnosis present

## 2017-09-20 DIAGNOSIS — E860 Dehydration: Secondary | ICD-10-CM | POA: Diagnosis present

## 2017-09-20 DIAGNOSIS — E1159 Type 2 diabetes mellitus with other circulatory complications: Secondary | ICD-10-CM | POA: Diagnosis present

## 2017-09-20 DIAGNOSIS — Z87891 Personal history of nicotine dependence: Secondary | ICD-10-CM

## 2017-09-20 DIAGNOSIS — R5383 Other fatigue: Secondary | ICD-10-CM | POA: Diagnosis present

## 2017-09-20 DIAGNOSIS — N179 Acute kidney failure, unspecified: Secondary | ICD-10-CM | POA: Diagnosis present

## 2017-09-20 DIAGNOSIS — E785 Hyperlipidemia, unspecified: Secondary | ICD-10-CM | POA: Diagnosis present

## 2017-09-20 DIAGNOSIS — I10 Essential (primary) hypertension: Secondary | ICD-10-CM | POA: Diagnosis present

## 2017-09-20 DIAGNOSIS — R627 Adult failure to thrive: Secondary | ICD-10-CM | POA: Diagnosis present

## 2017-09-20 DIAGNOSIS — Z833 Family history of diabetes mellitus: Secondary | ICD-10-CM

## 2017-09-20 DIAGNOSIS — E43 Unspecified severe protein-calorie malnutrition: Secondary | ICD-10-CM | POA: Diagnosis present

## 2017-09-20 DIAGNOSIS — Z794 Long term (current) use of insulin: Secondary | ICD-10-CM

## 2017-09-20 DIAGNOSIS — E1010 Type 1 diabetes mellitus with ketoacidosis without coma: Principal | ICD-10-CM | POA: Diagnosis present

## 2017-09-20 DIAGNOSIS — Z681 Body mass index (BMI) 19 or less, adult: Secondary | ICD-10-CM

## 2017-09-20 LAB — URINALYSIS, ROUTINE W REFLEX MICROSCOPIC
Bacteria, UA: NONE SEEN
Bilirubin Urine: NEGATIVE
Glucose, UA: >=500 mg/dL — AB
HGB URINE DIPSTICK: NEGATIVE
Ketones, ur: 80 mg/dL — AB
LEUKOCYTES UA: NEGATIVE
Nitrite: NEGATIVE
PROTEIN: NEGATIVE mg/dL
SPECIFIC GRAVITY, URINE: 1.022 (ref 1.005–1.030)
pH: 5 (ref 5.0–8.0)

## 2017-09-20 LAB — CBG MONITORING, ED
GLUCOSE-CAPILLARY: 507 mg/dL — AB (ref 65–99)
GLUCOSE-CAPILLARY: 402 mg/dL — AB (ref 65–99)
GLUCOSE-CAPILLARY: 228 mg/dL — AB (ref 65–99)
Glucose-Capillary: 301 mg/dL — ABNORMAL HIGH (ref 65–99)
Glucose-Capillary: 289 mg/dL — ABNORMAL HIGH (ref 65–99)
Glucose-Capillary: 243 mg/dL — ABNORMAL HIGH (ref 65–99)
Glucose-Capillary: 235 mg/dL — ABNORMAL HIGH (ref 65–99)

## 2017-09-20 LAB — GLUCOSE, CAPILLARY
GLUCOSE-CAPILLARY: 208 mg/dL — AB (ref 65–99)
GLUCOSE-CAPILLARY: 175 mg/dL — AB (ref 65–99)
GLUCOSE-CAPILLARY: 138 mg/dL — AB (ref 65–99)
Glucose-Capillary: 225 mg/dL — ABNORMAL HIGH (ref 65–99)
Glucose-Capillary: 224 mg/dL — ABNORMAL HIGH (ref 65–99)
Glucose-Capillary: 124 mg/dL — ABNORMAL HIGH (ref 65–99)

## 2017-09-20 LAB — BASIC METABOLIC PANEL
ANION GAP: 14 (ref 5–15)
ANION GAP: 9 (ref 5–15)
BUN: 23 mg/dL — ABNORMAL HIGH (ref 6–20)
BUN: 22 mg/dL — AB (ref 6–20)
BUN: 20 mg/dL (ref 6–20)
BUN: 18 mg/dL (ref 6–20)
CALCIUM: 7.7 mg/dL — AB (ref 8.9–10.3)
CALCIUM: 7.8 mg/dL — AB (ref 8.9–10.3)
CHLORIDE: 95 mmol/L — AB (ref 101–111)
CO2: <7 mmol/L — ABNORMAL LOW (ref 22–32)
CO2: 10 mmol/L — ABNORMAL LOW (ref 22–32)
CO2: 15 mmol/L — ABNORMAL LOW (ref 22–32)
CREATININE: 1.26 mg/dL — AB (ref 0.61–1.24)
CREATININE: 1.19 mg/dL (ref 0.61–1.24)
CREATININE: 0.86 mg/dL (ref 0.61–1.24)
Calcium: 9.4 mg/dL (ref 8.9–10.3)
Calcium: 7.9 mg/dL — ABNORMAL LOW (ref 8.9–10.3)
Chloride: 112 mmol/L — ABNORMAL HIGH (ref 101–111)
Chloride: 114 mmol/L — ABNORMAL HIGH (ref 101–111)
Chloride: 114 mmol/L — ABNORMAL HIGH (ref 101–111)
Creatinine, Ser: 1.46 mg/dL — ABNORMAL HIGH (ref 0.61–1.24)
GFR calc Af Amer: >60 mL/min (ref >60)
GFR calc non Af Amer: >60 mL/min (ref >60)
GLUCOSE: 350 mg/dL — AB (ref 65–99)
Glucose, Bld: 507 mg/dL (ref 65–99)
Glucose, Bld: 266 mg/dL — ABNORMAL HIGH (ref 65–99)
Glucose, Bld: 225 mg/dL — ABNORMAL HIGH (ref 65–99)
POTASSIUM: 5.4 mmol/L — AB (ref 3.5–5.1)
POTASSIUM: 4.7 mmol/L (ref 3.5–5.1)
Potassium: 4.3 mmol/L (ref 3.5–5.1)
Potassium: 3.8 mmol/L (ref 3.5–5.1)
SODIUM: 131 mmol/L — AB (ref 135–145)
SODIUM: 138 mmol/L (ref 135–145)
SODIUM: 138 mmol/L (ref 135–145)
Sodium: 138 mmol/L (ref 135–145)

## 2017-09-20 LAB — CBC
HCT: 45.7 % (ref 39.0–52.0)
HEMATOCRIT: 48.9 % (ref 39.0–52.0)
HEMOGLOBIN: 16.6 g/dL (ref 13.0–17.0)
Hemoglobin: 15.4 g/dL (ref 13.0–17.0)
MCH: 30.5 pg (ref 26.0–34.0)
MCH: 30.6 pg (ref 26.0–34.0)
MCHC: 33.9 g/dL (ref 30.0–36.0)
MCHC: 33.7 g/dL (ref 30.0–36.0)
MCV: 89.7 fL (ref 78.0–100.0)
MCV: 90.7 fL (ref 78.0–100.0)
PLATELETS: 260 10*3/uL (ref 150–400)
Platelets: 299 10*3/uL (ref 150–400)
RBC: 5.45 MIL/uL (ref 4.22–5.81)
RBC: 5.04 MIL/uL (ref 4.22–5.81)
RDW: 13.2 % (ref 11.5–15.5)
RDW: 13.3 % (ref 11.5–15.5)
WBC: 10.4 10*3/uL (ref 4.0–10.5)
WBC: 11.5 10*3/uL — AB (ref 4.0–10.5)

## 2017-09-20 LAB — I-STAT VENOUS BLOOD GAS, ED
Acid-base deficit: 21 mmol/L — ABNORMAL HIGH (ref 0.0–2.0)
Bicarbonate: 6.4 mmol/L — ABNORMAL LOW (ref 20.0–28.0)
O2 SAT: 81 %
PCO2 VEN: 19.7 mmHg — AB (ref 44.0–60.0)
TCO2: 7 mmol/L — AB (ref 22–32)
pH, Ven: 7.119 — CL (ref 7.250–7.430)
pO2, Ven: 59 mmHg — ABNORMAL HIGH (ref 32.0–45.0)

## 2017-09-20 LAB — HEMOGLOBIN A1C
HEMOGLOBIN A1C: 13 % — AB (ref 4.8–5.6)
MEAN PLASMA GLUCOSE: 326.4 mg/dL

## 2017-09-20 LAB — MAGNESIUM: MAGNESIUM: 1.8 mg/dL (ref 1.7–2.4)

## 2017-09-20 LAB — PHOSPHORUS: Phosphorus: 3.3 mg/dL (ref 2.5–4.6)

## 2017-09-20 MED ORDER — SODIUM CHLORIDE 0.9 % IV SOLN: INTRAVENOUS | Status: DC

## 2017-09-20 MED ORDER — ENOXAPARIN SODIUM 40 MG/0.4ML ~~LOC~~ SOLN
40.0000 mg | SUBCUTANEOUS | Status: DC
  Administered 2017-09-20: 40 mg via SUBCUTANEOUS
  Filled 2017-09-20 (×3): qty 0.4

## 2017-09-20 MED ORDER — SODIUM CHLORIDE 0.9 % IV SOLN
INTRAVENOUS | Status: DC
  Administered 2017-09-20: 14:00:00 via INTRAVENOUS

## 2017-09-20 MED ORDER — SODIUM CHLORIDE 0.9 % IV BOLUS
1000.0000 mL | Freq: Once | INTRAVENOUS | Status: AC
  Administered 2017-09-20: 1000 mL via INTRAVENOUS

## 2017-09-20 MED ORDER — DEXTROSE-NACL 5-0.45 % IV SOLN
INTRAVENOUS | Status: DC
  Administered 2017-09-20 (×2): via INTRAVENOUS

## 2017-09-20 MED ORDER — SODIUM CHLORIDE 0.9 % IV SOLN
INTRAVENOUS | Status: DC
  Administered 2017-09-21: 22:00:00 via INTRAVENOUS

## 2017-09-20 MED ORDER — INSULIN REGULAR HUMAN 100 UNIT/ML IJ SOLN
INTRAMUSCULAR | Status: DC
  Administered 2017-09-20: 9.8 [IU]/h via INTRAVENOUS
  Administered 2017-09-20: 3.4 [IU]/h via INTRAVENOUS
  Administered 2017-09-21: 2.4 [IU]/h via INTRAVENOUS
  Filled 2017-09-20 (×2): qty 1

## 2017-09-20 NOTE — ED Provider Notes (Signed)
[facility] EMERGENCY DEPARTMENT Provider Note   CSN: [id number] Arrival date & time: 09/20/17  2355   History   Chief Complaint Vomiting, palpitations   HPI [name] [name] is a 32 y.o. male with a PMH of uncontrolled T1DM presenting to the ED with palpitations. He thinks he might be in DKA. He had 4-5 episodes of vomiting two days ago as well as 1-2 episodes yesterday. His blood sugars have been in the 300s since then (he states his blood sugars typically run in the 200s). He has been taking Lantus 25-30 units nightly. He typically takes Novolog 15 units with meals, but has only been taking 8-10 units per meal for the last two days due to decreased intake. No abdominal pain, no diarrhea, no fevers. He endorses chills and shortness breath. No chest pain.  Past Medical History:  Diagnosis Date  . ARF (acute renal failure) ([place]) 2011   pt was on dialysis "for 1 day approx 2011, pt is no longer on dialysis (03/09/2017)  . DKA (diabetic ketoacidoses) ([place])    "several times" (03/09/2017)  . Type I diabetes mellitus [facility]) Dx 2012    Patient Active Problem List   Diagnosis Date Noted  . Diabetes mellitus type 1, uncontrolled, with complications ([place])   . Noncompliance by refusing service   . DKA (diabetic ketoacidoses) ([place]) 06/29/2017  . DKA, type 1 ([place]) 03/09/2017  . Hypophosphatemia 08/27/2016  . AKI (acute kidney injury) ([place]) 07/11/2016  . Hyperkalemia 03/09/2016  . Renal insufficiency 03/09/2016  . Leukocytosis 03/09/2016  . Diabetic ketoacidosis without coma associated with type 1 diabetes mellitus ([place])   . Non-intractable vomiting with nausea   . Chest pain 12/18/2015  . Marijuana use 07/22/2015  . Proteinuria with type 1 diabetes mellitus ([place]) 07/22/2015  . High blood pressure 07/22/2015  . Uncontrolled type 1 diabetes mellitus with ketoacidosis without coma ([place])   . Hypocalcemia   . Acute renal failure ([place])   . Hypokalemia   . Hypomagnesemia   .  Abnormal EKG 06/15/2015  . Sinus tachycardia   . Tachypnea   . Tinea pedis 12/17/2014  . Adjustment disorder with mixed anxiety and depressed mood 09/03/2014  . Protein-calorie malnutrition, severe ([place]) 07/06/2014  . DM (diabetes mellitus), type 1, uncontrolled ([place]) 05/01/2014  . HLD (hyperlipidemia) 04/26/2014  . Substance abuse ([place])   . Dehydration 07/07/2013  . Fatigue 03/10/2012    Past Surgical History:  Procedure Laterality Date  . NO PAST SURGERIES          Home Medications    Prior to Admission medications   Medication Sig Start Date End Date Taking? Authorizing Provider  Blood Glucose Monitoring Suppl (TRUE METRIX METER) w/Device KIT 1 each by Does not apply route as needed. 07/12/16   Florencia Reasons, MD  insulin aspart (NOVOLOG) 100 UNIT/ML injection Inject 15 Units into the skin 3 (three) times daily before meals.    [provider]  Insulin Glargine (LANTUS) 100 UNIT/ML Solostar Pen Inject 13 Units into the skin daily at 10 pm. 07/01/17   Allie Bossier, MD  lisinopril (PRINIVIL,ZESTRIL) 5 MG tablet Take 1 tablet (5 mg total) by mouth daily. 08/24/17   Eugenie Filler, MD  pantoprazole (PROTONIX) 40 MG tablet Take 1 tablet (40 mg total) by mouth daily at 6 (six) AM. 08/24/17   Eugenie Filler, MD    Family History Family History  Problem Relation Age of Onset  . Diabetes Father   . Diabetes Maternal  Grandmother   . Heart disease Neg Hx   . Hypertension Neg Hx     Social History Social History   Tobacco Use  . Smoking status: Former Smoker    Packs/day: 0.25    Years: 10.00    Pack years: 2.50    Types: Cigarettes    Last attempt to quit: 09/19/2013    Years since quitting: 4.0  . Smokeless tobacco: Never Used  Substance Use Topics  . Alcohol use: Yes    Alcohol/week: 0.0 oz    Comment: 03/09/2017 "normally I don't drink; drank 3 shots liquor 2 days ago"  . Drug use: No     Allergies   Patient has no known allergies.  Review of  Systems Review of Systems  Constitutional: Positive for appetite change and chills. Negative for fever.  HENT: Negative for congestion and sore throat.   Respiratory: Positive for shortness of breath. Negative for wheezing.   Cardiovascular: Positive for palpitations. Negative for chest pain.  Gastrointestinal: Positive for nausea and vomiting. Negative for abdominal pain.  Genitourinary: Negative for dysuria and frequency.  Musculoskeletal: Negative for back pain and myalgias.  Neurological: Negative for dizziness and weakness.  Psychiatric/Behavioral: Negative for confusion. The patient is not nervous/anxious.     Physical Exam Updated Vital Signs BP 137/90 (BP Location: Right Arm)   Pulse (!) 128   Temp 98.2 F (36.8 C) (Oral)   Resp 18   SpO2 100%   Physical Exam  Constitutional: He is oriented to person, place, and time. No distress.  Thin, tired-appearing  HENT:  Dry mucous membranes  Eyes: Conjunctivae and EOM are normal.  Neck: Normal range of motion. Neck supple.  Cardiovascular: Regular rhythm.  No murmur heard. Tachycardic   Pulmonary/Chest: Effort normal. No respiratory distress. He has no wheezes.  Breath sounds diminished in the lung bases bilaterally  Abdominal: Soft. Bowel sounds are normal. He exhibits no distension. There is no tenderness. There is no rebound and no guarding.  Musculoskeletal: Normal range of motion. He exhibits no edema.  Neurological: He is alert and oriented to person, place, and time. No cranial nerve deficit.  Skin: Skin is warm and dry. No rash noted.  Psychiatric: He has a normal mood and affect. His behavior is normal. Thought content normal.    ED Treatments / Results  Labs (all labs ordered are listed, but only abnormal results are displayed) Labs Reviewed  CBG MONITORING, ED - Abnormal; Notable for the following components:      Result Value   Glucose-Capillary 507 (*)    All other components within normal limits  BASIC  METABOLIC PANEL  CBC  URINALYSIS, ROUTINE W REFLEX MICROSCOPIC    EKG None  Radiology No results found.  Procedures Procedures (including critical care time)  Medications Ordered in ED Medications - No data to display   Initial Impression / Assessment and Plan / ED Course  I have reviewed the triage vital signs and the nursing notes.  Pertinent labs & imaging results that were available during my care of the patient were reviewed by me and considered in my medical decision making (see chart for details).  32 year old male presenting with palpitations, vomiting, and concern for DKA in the setting of vomiting over the last two days. He is tachycardic and appears mildly dehydrated on exam. Initial CBG in the 500s. Will order CBC, CMP, UA, VBG, EKG, 2 view CXR. Will give a NS bolus x 2.  VBG: pH 7.119, pCO2  19.7, pO2 59, bicarb 6.4.  10:45AM: Labs showing K 5.4, Cr 1.46 (BL 0.5), bicarb <7, WBC 10.4. CXR negative. EKG with sinus tachycardia. Will start on insulin gtt for DKA and give another 1L NS bolus.   11:10AM: Discussed with [facility], who will admit to stepdown.   Final Clinical Impressions(s) / ED Diagnoses   Final diagnoses:  None    ED Discharge Orders    None       Vlada Uriostegui, Pete Pelt, MD 09/20/17 1115

## 2017-09-20 NOTE — ED Notes (Signed)
Dr.Allen made aware of VBG results. ED-Lab.

## 2017-09-20 NOTE — ED Provider Notes (Signed)
I saw and evaluated the patient, reviewed the resident's note and I agree with the findings and plan.   EKG Interpretation  Date/Time:  Thursday Sep 20 2017 08:57:45 EDT Ventricular Rate:  126 PR Interval:    QRS Duration: 62 QT Interval:  286 QTC Calculation: 414 R Axis:   87 Text Interpretation:  Sinus tachycardia Anteroseptal infarct, old Minimal ST elevation, inferior leads Baseline wander in lead(s) V2 V6 Confirmed by Lorre Nick ([id number]) on 09/20/2017 9:08:47 AM      32 year old male presents with polyuria and polydipsia.  Has history of type 1 diabetes.  His ABG shows a metabolic acidosis consistent with DKA.  Blood sugar as well as heart rate elevated.  Will treat with IV fluids here and start insulin drip pending the results of his potassium.  Patient will require inpatient admission   Lorre Nick, MD 09/20/17 [phone]

## 2017-09-20 NOTE — ED Notes (Signed)
Lab reports Anion Gap will not calculate because CO2 less than 7.

## 2017-09-20 NOTE — ED Triage Notes (Addendum)
Patient here complaining of blood sugar reading high x 2 days. States that heart is racing and has hx of DKA. Took 10 units of lantus this am prior to arrival. Alert and oriented. Patient complains of severe weakness and fatigue with same

## 2017-09-20 NOTE — ED Notes (Signed)
Patient is aware he needs to urinate

## 2017-09-20 NOTE — ED Notes (Signed)
Attempted IV x 1 unsuccessful.

## 2017-09-20 NOTE — Progress Notes (Signed)
Patient arrived to the unit.  Patient alert and orientated x4.  Patient skin intact.  Insulin drip infusing.  Patient placed on cardiac monitor [id number].  Patient oriented to the unit.  MD notified of arrival.  Will continue to monitor.

## 2017-09-20 NOTE — H&P (Signed)
History and Physical    Pheonix Clinkscale QBV:[id number] DOB: 10-09-1985 DOA: 09/20/2017  PCP: [name], MD  Patient coming from: Home  I have personally briefly reviewed patient's old medical records in [place]  Chief Complaint: Blood sugar high for 2 days  HPI: [name] [name] is a 32 y.o. male with medical history significant for 7 admissions to the hospital for DKA in the past 12 months, uncontrolled T1DM presenting to the ED with palpitations. He thinks he might be in DKA. He had 4-5 episodes of vomiting two days ago as well as 1-2 episodes yesterday. His blood sugars have been in the 300s since then (he states his blood sugars typically run in the 200s). He has been taking Lantus 25-30 units nightly (which is higher than his prescribed dose in an attempt to keep his sugars under control). He typically takes Novolog 15 units with meals, but has only been taking 8-10 units per meal for the last two days due to decreased intake.  He does complain of abdominal pain but states he has no diarrhea, no fevers. He endorses chills and shortness breath. No chest pain.  He complains of urinary frequency but no dysuria, no myalgias or arthralgias.  No dizziness no double vision.  He does have blurry vision however.  Insulin pump has been discussed with the patient but due to insurance issues he is unable to obtain one.  He is very knowledgeable about his diabetes he has great difficulty however in managing his sugars.   ED Course: He was bolused 3 L of IV fluids and an insulin drip was begun.  His pH was noted to be 7.11 his bicarb was measured at 6.4.  Review of Systems: As per HPI otherwise all other systems reviewed and  negative.    Past Medical History:  Diagnosis Date  . ARF (acute renal failure) ([place]) 2011   pt was on dialysis "for 1 day approx 2011, pt is no longer on dialysis (03/09/2017)  . DKA (diabetic ketoacidoses) ([place])    "several times" (03/09/2017)  . Type I diabetes  mellitus ([place]) Dx 2012    Past Surgical History:  Procedure Laterality Date  . NO PAST SURGERIES     Social History   Social History Narrative   Lives with mother and older brother.   Has 1 daughter, age 27.        reports that he quit smoking about 4 years ago. His smoking use included cigarettes. He has a 2.50 pack-year smoking history. He has never used smokeless tobacco. He reports that he drinks alcohol. He reports that he does not use drugs.  No Known Allergies  Family History  Problem Relation Age of Onset  . Diabetes Father   . Diabetes Maternal Grandmother   . Heart disease Neg Hx   . Hypertension Neg Hx     Prior to Admission medications   Medication Sig Start Date End Date Taking? Authorizing Provider  insulin aspart (NOVOLOG) 100 UNIT/ML injection Inject 15 Units into the skin 3 (three) times daily before meals.   Yes [provider]  Insulin Glargine (LANTUS) 100 UNIT/ML Solostar Pen Inject 13 Units into the skin daily at 10 pm. Patient taking differently: Inject 25 Units into the skin daily at 10 pm.  07/01/17  Yes Allie Bossier, MD  Blood Glucose Monitoring Suppl (TRUE METRIX METER) w/Device KIT 1 each by Does not apply route as needed. 07/12/16   Florencia Reasons, MD  lisinopril (PRINIVIL,ZESTRIL) 5  MG tablet Take 1 tablet (5 mg total) by mouth daily. Patient not taking: Reported on 09/20/2017 08/24/17   Eugenie Filler, MD  pantoprazole (PROTONIX) 40 MG tablet Take 1 tablet (40 mg total) by mouth daily at 6 (six) AM. Patient not taking: Reported on 09/20/2017 08/24/17   Eugenie Filler, MD    Physical Exam: Vitals:   09/20/17 1000 09/20/17 1019 09/20/17 1045 09/20/17 1115  BP: (!) 147/89  (!) 153/93 (!) 155/92  Pulse: (!) 130  (!) 128 (!) 127  Resp: _0 Temp:  98.3 F (36.8 C)    TempSrc:  Oral    SpO2: 100%  99% 100%   .TCS Constitutional: NAD, calm, comfortable Vitals:   09/20/17 1000 09/20/17 1019 09/20/17 1045 09/20/17 1115  BP: (!)  147/89  (!) 153/93 (!) 155/92  Pulse: (!) 130  (!) 128 (!) 127  Resp: _1 Temp:  98.3 F (36.8 C)    TempSrc:  Oral    SpO2: 100%  99% 100%   Eyes: PERRL, lids and conjunctivae normal ENMT: Mucous membranes are dry. Posterior pharynx clear of any exudate or lesions.Normal dentition.  Patient with fruity breath Neck: normal, supple, no masses, no thyromegaly Respiratory: clear to auscultation bilaterally, no wheezing, no crackles. Normal respiratory effort. No accessory muscle use.  Cardiovascular: Regular rate and rhythm, no murmurs / rubs / gallops. No extremity edema. 2+ pedal pulses. No carotid bruits.  Abdomen: Moderate tenderness to palpation no rebound or guarding, no masses palpated. No hepatosplenomegaly. Bowel sounds positive.  Musculoskeletal: no clubbing / cyanosis. No joint deformity upper and lower extremities. Good ROM, no contractures. Normal muscle tone.  Skin: no rashes, lesions, ulcers. No induration Neurologic: CN 2-12 grossly intact. Sensation intact, DTR normal. Strength 5/5 in all 4.  Psychiatric: Normal judgment and insight. Alert and oriented x 3. Normal mood.     Labs on Admission: I have personally reviewed following labs and imaging studies  CBC: Recent Labs  Lab 09/20/17 0850  WBC 10.4  HGB 16.6  HCT 48.9  MCV 89.7  PLT 546   Basic Metabolic Panel: Recent Labs  Lab 09/20/17 0850  NA 131*  K 5.4*  CL 95*  CO2 <7*  GLUCOSE 507*  BUN 23*  CREATININE 1.46*  CALCIUM 9.4   CBG: Recent Labs  Lab 09/20/17 0751 09/20/17 1139  GLUCAP 507* 402*   Urine analysis:    Component Value Date/Time   COLORURINE STRAW (A) 09/20/2017 [place] 09/20/2017 0743   LABSPEC 1.022 09/20/2017 0743   PHURINE 5.0 09/20/2017 0743   GLUCOSEU >=500 (A) 09/20/2017 0743   HGBUR NEGATIVE 09/20/2017 [place] 09/20/2017 0743   BILIRUBINUR Negative 02/07/2016 1528   KETONESUR 80 (A) 09/20/2017 0743   PROTEINUR NEGATIVE  09/20/2017 0743   UROBILINOGEN 0.2 02/07/2016 1528   UROBILINOGEN 0.2 03/22/2015 0913   NITRITE NEGATIVE 09/20/2017 0743   LEUKOCYTESUR NEGATIVE 09/20/2017 0743    Radiological Exams on Admission: Dg Chest 2 View  Result Date: 09/20/2017 CLINICAL DATA:  Two days of hyperglycemia, tachycardia. History of DKA. Generalized weakness. EXAM: CHEST - 2 VIEW COMPARISON:  Chest x-ray of August 21, 2017 FINDINGS: The lungs are hyperinflated with hemidiaphragm flattening. There is no focal infiltrate. There is no pleural effusion. The heart and pulmonary vascularity are normal. The mediastinum is normal in width. The trachea is midline. The bony thorax exhibits no acute abnormality. IMPRESSION: There is no active  cardiopulmonary disease. Electronically Signed   By: David  Martinique M.D.   On: 09/20/2017 09:28    EKG: Independently reviewed. Sinus tachycardia Anteroseptal infarct, old Minimal ST elevation, inferior leads Baseline wander in lead(s) V2 V6 Compared to August 21, 2017 there is no change.  Assessment/Plan Principal Problem:   DKA, type 1 (HCC) Active Problems:   Hypertension complicating diabetes ([place])   AKI (acute kidney injury) ([place])   Dehydration   Protein-calorie malnutrition, severe (HCC)   Fatigue     1.  DKA type I diabetic: Patient will be admitted into the hospital his anion gap is unable to be calculated because his CO2 level is less than 7.  This is probably likely severe DKA given that his pH is 7.11.  He has been given some IV fluid in the emergency department I am going to repeat a BMP stat and repeat a venous blood gas as well if pH has not improved we will consider bicarb drip.  Patient also has been started on an insulin drip in the emergency department which we will continue upstairs.  Unfortunately the patient has discontinued using his lisinopril and his blood pressure is elevated.  I will restart lisinopril both for renal protection and management of his hypertension.  And  is seriously ill with severe acidosis and will require extensive treatment to reverse his DKA.  2.  Hypertension complicating diabetes: We will restart lisinopril as noted above.  Will monitor blood pressure response to treatment.  Patient will need a prescription for this medication at discharge.  It is still available on the $4 list at [facility].  3.  Acute kidney injury: Patient with a creatinine of 1.4 his usual creatinine is 0.5.  Will hydrate and repeat BMP if he is not responsive to this he may require nephrology consultation.  4.  Dehydration: Likely cause of his acute kidney injury.  We will hydrate and reevaluate after IV fluids.  Magnesium level has been ordered.  5.  Protein calorie malnutrition severe: Patient still registering albumin levels in the twos in early April 2018.  Will recheck CMP in a.m. to determine if he is improving.  6.  Fatigue: Likely due to DKA suspect getting patient's sugars more normal will help with his fatigue.  7.  Abdominal pain: Likely related to his acute DKA should improve once acidosis resolves.  DVT prophylaxis: Lovenox Code Status: Full code Family Communication: No family members present.  Patient retains capacity. Disposition Plan: Likely home in 2 midnights Consults called: None Admission status: Inpatient   Lady Deutscher MD [facility] Pager [phone]  If 7PM-7AM, please contact night-coverage [email or web address] Password [facility]  09/20/2017, 12:07 PM

## 2017-09-21 ENCOUNTER — Telehealth: Payer: Self-pay | Admitting: Family Medicine

## 2017-09-21 LAB — BASIC METABOLIC PANEL
Anion gap: 6 (ref 5–15)
Anion gap: 9 (ref 5–15)
BUN: 15 mg/dL (ref 6–20)
BUN: 15 mg/dL (ref 6–20)
CHLORIDE: 112 mmol/L — AB (ref 101–111)
CHLORIDE: 109 mmol/L (ref 101–111)
CO2: 18 mmol/L — AB (ref 22–32)
CO2: 17 mmol/L — AB (ref 22–32)
CREATININE: 0.67 mg/dL (ref 0.61–1.24)
CREATININE: 0.69 mg/dL (ref 0.61–1.24)
Calcium: 7.6 mg/dL — ABNORMAL LOW (ref 8.9–10.3)
Calcium: 7.8 mg/dL — ABNORMAL LOW (ref 8.9–10.3)
GFR calc Af Amer: >60 mL/min (ref >60)
GFR calc Af Amer: >60 mL/min (ref >60)
GFR calc non Af Amer: >60 mL/min (ref >60)
GFR calc non Af Amer: >60 mL/min (ref >60)
GLUCOSE: 123 mg/dL — AB (ref 65–99)
GLUCOSE: 118 mg/dL — AB (ref 65–99)
POTASSIUM: 3.3 mmol/L — AB (ref 3.5–5.1)
POTASSIUM: 3.2 mmol/L — AB (ref 3.5–5.1)
Sodium: 135 mmol/L (ref 135–145)
Sodium: 136 mmol/L (ref 135–145)

## 2017-09-21 LAB — GLUCOSE, CAPILLARY
GLUCOSE-CAPILLARY: 109 mg/dL — AB (ref 65–99)
GLUCOSE-CAPILLARY: 95 mg/dL (ref 65–99)
GLUCOSE-CAPILLARY: 125 mg/dL — AB (ref 65–99)
GLUCOSE-CAPILLARY: 124 mg/dL — AB (ref 65–99)
Glucose-Capillary: 111 mg/dL — ABNORMAL HIGH (ref 65–99)
Glucose-Capillary: 129 mg/dL — ABNORMAL HIGH (ref 65–99)
Glucose-Capillary: 190 mg/dL — ABNORMAL HIGH (ref 65–99)
Glucose-Capillary: 194 mg/dL — ABNORMAL HIGH (ref 65–99)
Glucose-Capillary: 92 mg/dL (ref 65–99)

## 2017-09-21 MED ORDER — INSULIN ASPART 100 UNIT/ML ~~LOC~~ SOLN
5.0000 [IU] | Freq: Three times a day (TID) | SUBCUTANEOUS | Status: DC
  Administered 2017-09-21 – 2017-09-22 (×3): 5 [IU] via SUBCUTANEOUS

## 2017-09-21 MED ORDER — POTASSIUM CHLORIDE 10 MEQ/100ML IV SOLN
10.0000 meq | INTRAVENOUS | Status: AC
  Administered 2017-09-21 (×3): 10 meq via INTRAVENOUS
  Filled 2017-09-21 (×3): qty 100

## 2017-09-21 MED ORDER — INSULIN ASPART 100 UNIT/ML ~~LOC~~ SOLN
0.0000 [IU] | SUBCUTANEOUS | Status: DC
  Administered 2017-09-21: 2 [IU] via SUBCUTANEOUS
  Administered 2017-09-21: 1 [IU] via SUBCUTANEOUS
  Administered 2017-09-22: 2 [IU] via SUBCUTANEOUS
  Administered 2017-09-22: 1 [IU] via SUBCUTANEOUS

## 2017-09-21 MED ORDER — MAGNESIUM SULFATE 2 GM/50ML IV SOLN
2.0000 g | Freq: Once | INTRAVENOUS | Status: AC
  Administered 2017-09-21: 2 g via INTRAVENOUS
  Filled 2017-09-21: qty 50

## 2017-09-21 MED ORDER — INSULIN GLARGINE 100 UNIT/ML ~~LOC~~ SOLN
5.0000 [IU] | Freq: Once | SUBCUTANEOUS | Status: AC
  Administered 2017-09-21: 5 [IU] via SUBCUTANEOUS
  Filled 2017-09-21: qty 0.05

## 2017-09-21 MED ORDER — INSULIN GLARGINE 100 UNIT/ML ~~LOC~~ SOLN
10.0000 [IU] | Freq: Every day | SUBCUTANEOUS | Status: DC
  Administered 2017-09-21: 10 [IU] via SUBCUTANEOUS
  Filled 2017-09-21: qty 0.1

## 2017-09-21 MED ORDER — ONDANSETRON HCL 4 MG/2ML IJ SOLN
4.0000 mg | Freq: Four times a day (QID) | INTRAMUSCULAR | Status: DC | PRN
  Administered 2017-09-21: 4 mg via INTRAVENOUS
  Filled 2017-09-21: qty 2

## 2017-09-21 MED ORDER — POTASSIUM CHLORIDE CRYS ER 20 MEQ PO TBCR
40.0000 meq | EXTENDED_RELEASE_TABLET | Freq: Three times a day (TID) | ORAL | Status: AC
  Administered 2017-09-21 (×3): 40 meq via ORAL
  Filled 2017-09-21 (×2): qty 2

## 2017-09-21 NOTE — Progress Notes (Addendum)
Inpatient Diabetes Program Recommendations  AACE/ADA: New Consensus Statement on Inpatient Glycemic Control (2015)  Target Ranges:  Prepandial:   less than 140 mg/dL      Peak postprandial:   less than 180 mg/dL (1-2 hours)      Critically ill patients:  140 - 180 mg/dL   Lab Results  Component Value Date   GLUCAP 111 (H) 09/21/2017   HGBA1C 13.0 (H) 09/20/2017    Review of Glycemic ControlResults for GUNTHER, ZAWADZKI (MRN [id number]) as of 09/21/2017 11:52  Ref. Range 09/21/2017 01:38 09/21/2017 02:42 09/21/2017 03:50 09/21/2017 04:56 09/21/2017 07:45  Glucose-Capillary Latest Ref Range: 65 - 99 mg/dL 130 (H) 95 865 (H) 784 (H) 111 (H)   Diabetes history: Type 1 DM  Outpatient Diabetes medications:  Lantus 25 units q HS, Novolog 15 units tid with meals Current orders for Inpatient glycemic control:  Lantus 10 units q HS, Novolog sensitive q 4 hours, Novolog 5 units tid with meals (just added)  Inpatient Diabetes Program Recommendations:   Note repeated DKA admit. Patient has been spoken to several times by DM coordinators. No further recommendations at this time.  Needs outpatient f/u and PCP so that he does not come to hospital for DM management and refills.  Thanks,  Beryl Meager, RN, BC-ADM Inpatient Diabetes Coordinator Pager [phone] (8a-5p)

## 2017-09-21 NOTE — Progress Notes (Addendum)
PROGRESS NOTE  [name] [name] ZOX:[id number] DOB: [birth year] DOA: 09/20/2017 PCP: Dessa Phi, MD  HPI/Recap of past 24 hours: [name] [name] is a 32 y.o. male with medical history significant for 7 admissions to the hospital for DKA in the past 12 months, uncontrolled T1DM presenting to the ED with palpitations. He thinks he might be in DKA. He had 4-5 episodes of vomiting two days ago as well as 1-2 episodes yesterday. His blood sugars have been in the 300s since then (he states his blood sugars typically run in the 200s). He has been taking Lantus 25-30 units nightly (which is higher than his prescribed dose in an attempt to keep his sugars under control). He typically takes Novolog 15 units with meals, but has only been taking 8-10 units per meal for the last two days due to decreased intake.  He does complain of abdominal pain but states he has no diarrhea, no fevers. He endorses chills and shortness breath. No chest pain.  He complains of urinary frequency but no dysuria, no myalgias or arthralgias.  No dizziness no double vision.  He does have blurry vision however.  Insulin pump has been discussed with the patient but due to insurance issues he is unable to obtain one.  He is very knowledgeable about his diabetes he has great difficulty however in managing his sugars.  09/21/17: seen and examined. Denies nausea or diarrhea. Admits to polyuria which is improving. Anion gap has closed.    Assessment/Plan: Principal Problem:   DKA, type 1 (HCC) Active Problems:   Fatigue   Dehydration   Protein-calorie malnutrition, severe (HCC)   Hypertension complicating diabetes (HCC)   AKI (acute kidney injury) (HCC)  Type 1 DKA Resolving Transition to long acting insulin\ lantus 10 u and novolog 5 u tid achs Monitor BS C/w ISS Diabetic diet IV fluid hydration  Failure to thrive' Encourage to increase protein calorie intake  Hypokalemia repleted Obtain magnesium level and repeat BMP  in the am  Dehydration from vomiting \ Improving Decrease fluid from 125 to 75 cc per hr NS  Intractable nausea and vomiting, resolved    Code Status: full  Family Communication: none at bedside   Disposition Plan: home when clinically stable   Consultants:  none  Procedures:  none  Antimicrobials:  none  DVT prophylaxis:  lovenox sq   Objective: Vitals:   09/21/17 1012 09/21/17 1313 09/21/17 1423 09/21/17 1618  BP: (!) 146/95  (!) 124/95   Pulse: 84  82   Resp: 17  16   Temp: 98.1 F (36.7 C) 98.3 F (36.8 C) 98.3 F (36.8 C) 98.8 F (37.1 C)  TempSrc: Oral Oral Oral Oral  SpO2: 100%  100%   Weight:      Height:        Intake/Output Summary (Last 24 hours) at 09/21/2017 1837 Last data filed at 09/21/2017 1653 Gross per 24 hour  Intake 2051.34 ml  Output 1275 ml  Net 776.34 ml   Filed Weights   09/20/17 1808  Weight: 47.5 kg (104 lb 11.5 oz)    Exam:  . General: 32 y.o. year-old male frail in no acute distress.  Alert and oriented x3. . Cardiovascular: Regular rate and rhythm with no rubs or gallops.  No thyromegaly or JVD noted.   Marland Kitchen Respiratory: Clear to auscultation with no wheezes or rales. Good inspiratory effort. . Abdomen: Soft nontender nondistended with normal bowel sounds x4 quadrants. . Musculoskeletal: No lower extremity edema. 2/4 pulses in  all 4 extremities. . Skin: No ulcerative lesions noted or rashes, . Psychiatry: Mood is appropriate for condition and setting   Data Reviewed: CBC: Recent Labs  Lab 09/20/17 0850 09/20/17 1132  WBC 10.4 11.5*  HGB 16.6 15.4  HCT 48.9 45.7  MCV 89.7 90.7  PLT 299 260   Basic Metabolic Panel: Recent Labs  Lab 09/20/17 1132 09/20/17 1554 09/20/17 1933 09/21/17 0022 09/21/17 0338  NA 138 138 138 136 135  K 4.7 4.3 3.8 3.3* 3.2*  CL 112* 114* 114* 112* 109  CO2 <7* 10* 15* 18* 17*  GLUCOSE 350* 266* 225* 123* 118*  BUN 22* CREATININE 1.26* 1.19 0.86 0.67 0.69    CALCIUM 7.9* 7.7* 7.8* 7.8* 7.6*  MG 1.8  --   --   --   --   PHOS 3.3  --   --   --   --    GFR: Estimated Creatinine Clearance: 88.2 mL/min (by C-G formula based on SCr of 0.69 mg/dL). Liver Function Tests: No results for input(s): AST, ALT, ALKPHOS, BILITOT, PROT, ALBUMIN in the last 168 hours. No results for input(s): LIPASE, AMYLASE in the last 168 hours. No results for input(s): AMMONIA in the last 168 hours. Coagulation Profile: No results for input(s): INR, PROTIME in the last 168 hours. Cardiac Enzymes: No results for input(s): CKTOTAL, CKMB, CKMBINDEX, TROPONINI in the last 168 hours. BNP (last 3 results) No results for input(s): PROBNP in the last 8760 hours. HbA1C: Recent Labs    09/20/17 1143  HGBA1C 13.0*   CBG: Recent Labs  Lab 09/21/17 0350 09/21/17 0456 09/21/17 0745 09/21/17 1255 09/21/17 1618  GLUCAP 125* 124* 111* 92 190*   Lipid Profile: No results for input(s): CHOL, HDL, LDLCALC, TRIG, CHOLHDL, LDLDIRECT in the last 72 hours. Thyroid Function Tests: No results for input(s): TSH, T4TOTAL, FREET4, T3FREE, THYROIDAB in the last 72 hours. Anemia Panel: No results for input(s): VITAMINB12, FOLATE, FERRITIN, TIBC, IRON, RETICCTPCT in the last 72 hours. Urine analysis:    Component Value Date/Time   COLORURINE STRAW (A) 09/20/2017 0743   APPEARANCEUR CLEAR 09/20/2017 0743   LABSPEC 1.022 09/20/2017 0743   PHURINE 5.0 09/20/2017 0743   GLUCOSEU >=500 (A) 09/20/2017 0743   HGBUR NEGATIVE 09/20/2017 0743   BILIRUBINUR NEGATIVE 09/20/2017 0743   BILIRUBINUR Negative 02/07/2016 1528   KETONESUR 80 (A) 09/20/2017 0743   PROTEINUR NEGATIVE 09/20/2017 0743   UROBILINOGEN 0.2 02/07/2016 1528   UROBILINOGEN 0.2 03/22/2015 0913   NITRITE NEGATIVE 09/20/2017 0743   LEUKOCYTESUR NEGATIVE 09/20/2017 0743   Sepsis Labs: (procalcitonin:4,lacticidven:4)  )No results found for this or any previous visit (from the past 240 hour(s)).     Studies: No results found.  Scheduled Meds: . enoxaparin (LOVENOX) injection  40 mg Subcutaneous Q24H  . insulin aspart  0-9 Units Subcutaneous Q4H  . insulin aspart  5 Units Subcutaneous TID WC  . insulin glargine  10 Units Subcutaneous Q2200  . potassium chloride  40 mEq Oral TID    Continuous Infusions: . sodium chloride       LOS: 1 day     Darlin Drop, MD [facility] Pager [phone]  If 7PM-7AM, please contact night-coverage [email or web address] Password [facility] 09/21/2017, 6:37 PM

## 2017-09-21 NOTE — Telephone Encounter (Signed)
Briefly met with patient during his admission in the hospital. Introduced myself to patient and explained the TCC clinic and its services. Patient agreed to be seen by TCC provider. Appointment was scheduled for May 10 @ 1:50pm. During our conversation patient stated that he had support from his family and friends. He also confirmed that we have the correct phone number on Epic. Informed patient that Opal Sidles, Tourist information centre manager, or myself will be calling him during the week to check on his status. Patient was appreciative.   Message to Gardiner Rhyme, case manager, has been sent informing her of patient's appointment.

## 2017-09-21 NOTE — Progress Notes (Signed)
On call Physician notified about the current t CBG 95 and the insulin gtt infusion rate at 0.5 unit/hr on glucose stabilizer., awaiting order from the on call Physician.

## 2017-09-21 NOTE — Progress Notes (Signed)
On call physician notified  Pt's progress on insulin gtt via glucose stabilizer. Physician ordered on phone to continue the infusion until C02 > 20.

## 2017-09-22 DIAGNOSIS — E1010 Type 1 diabetes mellitus with ketoacidosis without coma: Principal | ICD-10-CM

## 2017-09-22 DIAGNOSIS — N179 Acute kidney failure, unspecified: Secondary | ICD-10-CM

## 2017-09-22 DIAGNOSIS — E860 Dehydration: Secondary | ICD-10-CM

## 2017-09-22 LAB — CBC
HCT: 38.6 % — ABNORMAL LOW (ref 39.0–52.0)
HEMOGLOBIN: 13.4 g/dL (ref 13.0–17.0)
MCH: 29.7 pg (ref 26.0–34.0)
MCHC: 34.7 g/dL (ref 30.0–36.0)
MCV: 85.6 fL (ref 78.0–100.0)
Platelets: 202 10*3/uL (ref 150–400)
RBC: 4.51 MIL/uL (ref 4.22–5.81)
RDW: 12.6 % (ref 11.5–15.5)
WBC: 5 10*3/uL (ref 4.0–10.5)

## 2017-09-22 LAB — MAGNESIUM: Magnesium: 2.4 mg/dL (ref 1.7–2.4)

## 2017-09-22 LAB — BASIC METABOLIC PANEL
Anion gap: 11 (ref 5–15)
BUN: 7 mg/dL (ref 6–20)
CHLORIDE: 101 mmol/L (ref 101–111)
CO2: 23 mmol/L (ref 22–32)
CREATININE: 0.55 mg/dL — AB (ref 0.61–1.24)
Calcium: 8.1 mg/dL — ABNORMAL LOW (ref 8.9–10.3)
GFR calc Af Amer: >60 mL/min (ref >60)
GFR calc non Af Amer: >60 mL/min (ref >60)
Glucose, Bld: 138 mg/dL — ABNORMAL HIGH (ref 65–99)
Potassium: 3.5 mmol/L (ref 3.5–5.1)
SODIUM: 135 mmol/L (ref 135–145)

## 2017-09-22 LAB — GLUCOSE, CAPILLARY
Glucose-Capillary: 100 mg/dL — ABNORMAL HIGH (ref 65–99)
Glucose-Capillary: 146 mg/dL — ABNORMAL HIGH (ref 65–99)
Glucose-Capillary: 182 mg/dL — ABNORMAL HIGH (ref 65–99)

## 2017-09-22 LAB — PHOSPHORUS: PHOSPHORUS: 1.5 mg/dL — AB (ref 2.5–4.6)

## 2017-09-22 NOTE — Discharge Summary (Signed)
Discharge Summary  [name] [name] FUX:[id number] DOB: March 12, 1986  PCP: [name], MD  Admit date: 09/20/2017 Discharge date: 09/22/2017  Time spent: 25 minutes  Recommendations for Outpatient Follow-up:  1. Left AGAINST MEDICAL ADVICE  Discharge Diagnoses:  Active Hospital Problems   Diagnosis Date Noted  . DKA, type 1 ([place]) 03/09/2017  . AKI (acute kidney injury) ([place]) 07/11/2016  . Hypertension complicating diabetes ([place]) 07/22/2015  . Protein-calorie malnutrition, severe ([place]) 07/06/2014  . Dehydration 07/07/2013  . Fatigue 03/10/2012    Resolved Hospital Problems  No resolved problems to display.     Vitals:   09/22/17 0542 09/22/17 0829  BP:    Pulse:    Resp:    Temp: 98.1 F (36.7 C) 98.6 F (37 C)  SpO2:      History of present illness:  [name] [name] a 32 y.o.malewith medical history significantfor 7 admissions to the hospital for DKA in the past 12 months,uncontrolled T1DM presenting to the ED with palpitations. He thinks he might be in DKA. He had 4-5 episodes of vomiting two days ago as well as 1-2 episodes yesterday. His blood sugars have been in the 300s since then (he states his blood sugars typically run in the 200s). He has been taking Lantus 25-30 units nightly (which is higher than his prescribed dose in an attempt to keep his sugars under control). He typically takes Novolog 15 units with meals, but has only been taking 8-10 units per meal for the last two days due to decreased intake.He does complain ofabdominal pain but states he hasno diarrhea, no fevers. He endorses chills and shortness breath. No chest pain.He complains of urinary frequency but no dysuria, no myalgias or arthralgias. No dizziness no double vision. He does have blurry vision however.  Insulin pump has been discussed with the patient but due to insurance issues he is unable to obtain one. He is very knowledgeable about his diabetes he has great difficulty however in  managing his sugars.  09/21/17: seen and examined. Denies nausea or diarrhea. Admits to polyuria which is improving. Anion gap has closed.  09/22/2017: Patient seen and examined at his bedside.  Reports persistent nausea.  Patient left AGAINST MEDICAL ADVICE.    Hospital Course:  Principal Problem:   DKA, type 1 ([place]) Active Problems:   Fatigue   Dehydration   Protein-calorie malnutrition, severe ([facility])   Hypertension complicating diabetes ([place])   AKI (acute kidney injury) ([place])  Type 1 DKA Resolving Transition to long acting insulin\ lantus 10 u and novolog 5 u tid achs Monitor BS C/w ISS Diabetic diet IV fluid hydration  Failure to thrive' Encourage to increase protein calorie intake  Hypokalemia repleted Obtain magnesium level and repeat BMP in the am  Dehydration from vomiting \ Improving Decrease fluid from 125 to 75 cc per hr NS  Intractable nausea and vomiting, resolved    Discharge Exam: BP (!) 136/92   Pulse 82   Temp 98.6 F (37 C) (Oral)   Resp 12   Ht '5\' 6"'$  (1.676 m)   Wt 47.5 kg (104 lb 11.5 oz)   SpO2 100%   BMI 16.90 kg/m    . General: 32 y.o. year-old male well developed well nourished in no acute distress.  Alert and oriented x3. . Cardiovascular: Regular rate and rhythm with no rubs or gallops.  No thyromegaly or JVD noted.   Marland Kitchen Respiratory: Clear to auscultation with no wheezes or rales. Good inspiratory effort. . Abdomen: Soft nontender nondistended with  normal bowel sounds x4 quadrants. . Musculoskeletal: No lower extremity edema. 2/4 pulses in all 4 extremities. . Skin: No ulcerative lesions noted or rashes, . Psychiatry: Mood is appropriate for condition and setting  Discharge Instructions You were cared for by a hospitalist during your hospital stay. If you have any questions about your discharge medications or the care you received while you were in the hospital after you are discharged, you can call the unit and asked to  speak with the hospitalist on call if the hospitalist that took care of you is not available. Once you are discharged, your primary care physician will handle any further medical issues. Please note that NO REFILLS for any discharge medications will be authorized once you are discharged, as it is imperative that you return to your primary care physician (or establish a relationship with a primary care physician if you do not have one) for your aftercare needs so that they can reassess your need for medications and monitor your lab values.   Allergies as of 09/22/2017   No Known Allergies     Medication List    TAKE these medications   insulin aspart 100 UNIT/ML injection Commonly known as:  novoLOG Inject 15 Units into the skin 3 (three) times daily before meals.   Insulin Glargine 100 UNIT/ML Solostar Pen Commonly known as:  LANTUS Inject 13 Units into the skin daily at 10 pm. What changed:  how much to take   [place] w/Device Kit 1 each by Does not apply route as needed.      No Known Allergies    The results of significant diagnostics from this hospitalization (including imaging, microbiology, ancillary and laboratory) are listed below for reference.    Significant Diagnostic Studies: Dg Chest 2 View  Result Date: 09/20/2017 CLINICAL DATA:  Two days of hyperglycemia, tachycardia. History of DKA. Generalized weakness. EXAM: CHEST - 2 VIEW COMPARISON:  Chest x-ray of August 21, 2017 FINDINGS: The lungs are hyperinflated with hemidiaphragm flattening. There is no focal infiltrate. There is no pleural effusion. The heart and pulmonary vascularity are normal. The mediastinum is normal in width. The trachea is midline. The bony thorax exhibits no acute abnormality. IMPRESSION: There is no active cardiopulmonary disease. Electronically Signed   By: David  Martinique M.D.   On: 09/20/2017 09:28    Microbiology: No results found for this or any previous visit (from the past 240  hour(s)).   Labs: Basic Metabolic Panel: Recent Labs  Lab 09/20/17 1132 09/20/17 1554 09/20/17 1933 09/21/17 0022 09/21/17 0338 09/22/17 0502  NA 138 138 138 136 135 135  K 4.7 4.3 3.8 3.3* 3.2* 3.5  CL 112* 114* 114* 112* 109 101  CO2 <7* 10* 15* 18* 17* 23  GLUCOSE 350* 266* 225* 123* 118* 138*  BUN 22* '20 18 15 15 7  '$ CREATININE 1.26* 1.19 0.86 0.67 0.69 0.55*  CALCIUM 7.9* 7.7* 7.8* 7.8* 7.6* 8.1*  MG 1.8  --   --   --   --  2.4  PHOS 3.3  --   --   --   --  1.5*   Liver Function Tests: No results for input(s): AST, ALT, ALKPHOS, BILITOT, PROT, ALBUMIN in the last 168 hours. No results for input(s): LIPASE, AMYLASE in the last 168 hours. No results for input(s): AMMONIA in the last 168 hours. CBC: Recent Labs  Lab 09/20/17 0850 09/20/17 1132 09/22/17 0502  WBC 10.4 11.5* 5.0  HGB 16.6 15.4 13.4  HCT  48.9 45.7 38.6*  MCV 89.7 90.7 85.6  PLT 299 260 202   Cardiac Enzymes: No results for input(s): CKTOTAL, CKMB, CKMBINDEX, TROPONINI in the last 168 hours. BNP: BNP (last 3 results) No results for input(s): BNP in the last 8760 hours.  ProBNP (last 3 results) No results for input(s): PROBNP in the last 8760 hours.  CBG: Recent Labs  Lab 09/21/17 1618 09/21/17 2012 09/22/17 0004 09/22/17 0541 09/22/17 0826  GLUCAP 190* 194* 146* 182* 100*       Signed:  Kayleen Memos, MD [facility] 09/22/2017, 1:48 PM

## 2017-09-22 NOTE — Plan of Care (Signed)
Date: 09/22/2017 Patient: [name] [name] Admitted: 09/20/2017  7:30 AM Attending Provider: Darlin Drop, DO  [name] [name] or his authorized caregiver has made the decision for the patient to leave the hospital against the advice of Darlin Drop, DO.  He or his authorized caregiver has been informed and understands the inherent risks, including death.  He or his authorized caregiver has decided to accept the responsibility for this decision. [name] [name] and all necessary parties have been advised that he may return for further evaluation or treatment. His condition at time of discharge was Good.  [name] [name] had current vital signs as follows:  Blood pressure (!) 136/92, pulse 82, temperature 98.6 F (37 C), temperature source Oral, resp. rate 12, height  (1.676 m), weight 47.5 kg (104 lb 11.5 oz), SpO2 100 %.   [name] [name] or his authorized caregiver has signed the Leaving Against Medical Advice form prior to leaving the department.  [name] [name] 09/22/2017

## 2017-09-22 NOTE — Care Management (Signed)
D/W [facility] and Diabetes Coordinator. There is no resource to get patient an insulin pump through the hospital in light of his lack of insurance and noncompliance.  Patient should follow up with his PCP, listed as Family Medicine, to obtain insulin pump. Diabetes Coordinator suggested he may change to 70/30, which is $25 at [facility].  She also states he currently has no insulin.    Attempted to discuss this with patient and patient had left unit AMA prior to d/c without instructions or prescriptions.

## 2017-09-24 ENCOUNTER — Telehealth: Payer: Self-pay

## 2017-09-24 NOTE — Telephone Encounter (Signed)
[facility] Post-discharge Follow-Up Phone Call:  Date of Discharge:  09/22/2017 - left AMA Principal Discharge Diagnosis(es):  DKA, DM - type 1, AKI,  Post-discharge Communication: (Clearly document all attempts clearly and date contact made) call placed to the patient Call Completed: Yes                    With Whom: Patient Interpreter Needed: No     Please check all that apply:  X  Patient is knowledgeable of his/her condition(s) and/or treatment. X  Patient is caring for self at home.  ? Patient is receiving assist at home from family and/or caregiver. Family and/or caregiver is knowledgeable of patient's condition(s) and/or treatment. ? Patient is receiving home health services. If so, name of agency.     Medication Reconciliation:  ? Medication list reviewed with patient. X  Patient obtained all discharge medications. If not, why? - he left AMA and as per Deveron Furlong, RN CM, the patient left the hospital AMA without any prescriptions or instructions. He stated that he has lantus and novolog at home. He explained that he knows that he needs to better control his blood sugars.  He said that he has a glucometer and tries to check his blood sugar 2-4 times /day depending on his work schedule. He reported that his blood sugar this morning was 170 and noted that he tends to run " high."  He stated that he is only working part time but would like to be full time in order to receive benefits. He is currently working at [employer].  He noted that he feels he has to have his health better controlled before he could work full time. Instructed him to keep a log of his blood sugars for his doctor to review.    He stated that he knows he should be taking 10-15 units of lantus at night  But he has been taking 25 units and is worried that is too much. The physican's discharge summary indicate he should be taking 13 units at night.  He then stated that he takes novolog 10 units three  times daily, not 15 units three times daily with meals as noted on the discharge summary. He said that he understands that he will need to meet with the pharmacist in order to receive assistance with obtaining his insulin.     Activities of Daily Living:  X  Independent ? Needs assist (describe; ? home DME used) ? Total Care (describe, ? home DME used)   Community resources in place for patient:  X  - he stated that he has been denied medicaid in the past. He noted that when he was in the hospital they assisted him with re-applying for medicaid.  This CM informed him of the importance of meeting with the [facility] Financial Counselor to apply for the [facility] Card/ [employer].  ? Home Health/Home DME ? Assisted Living ? Support Group       Questions/Concerns discussed: he said that he did not have any questions at this time. He confirmed that he would be at his appointment and repeated back to this CM the correct day/time - 09/28/17 @ 1350. He also said that he has transportation to the clinic, noting that he uses [employer] bus when needed.

## 2017-09-28 ENCOUNTER — Encounter: Payer: Self-pay | Admitting: Family Medicine

## 2017-09-28 ENCOUNTER — Ambulatory Visit: Payer: Self-pay | Attending: Family Medicine | Admitting: Family Medicine

## 2017-09-28 VITALS — BP 129/74 | HR 91 | Temp 97.8°F | Wt 113.0 lb

## 2017-09-28 DIAGNOSIS — E1069 Type 1 diabetes mellitus with other specified complication: Secondary | ICD-10-CM

## 2017-09-28 DIAGNOSIS — Z794 Long term (current) use of insulin: Secondary | ICD-10-CM | POA: Insufficient documentation

## 2017-09-28 DIAGNOSIS — E10649 Type 1 diabetes mellitus with hypoglycemia without coma: Secondary | ICD-10-CM

## 2017-09-28 DIAGNOSIS — E1065 Type 1 diabetes mellitus with hyperglycemia: Secondary | ICD-10-CM | POA: Insufficient documentation

## 2017-09-28 LAB — GLUCOSE, POCT (MANUAL RESULT ENTRY): POC GLUCOSE: 366 mg/dL — AB (ref 70–99)

## 2017-09-28 MED ORDER — INSULIN ASPART 100 UNIT/ML ~~LOC~~ SOLN: 0.0000 [IU] | Freq: Three times a day (TID) | SUBCUTANEOUS | 3 refills | Status: DC

## 2017-09-28 MED ORDER — INSULIN GLARGINE 100 UNIT/ML SOLOSTAR PEN: PEN_INJECTOR | SUBCUTANEOUS | 3 refills | Status: DC

## 2017-09-28 NOTE — Progress Notes (Signed)
Subjective:  Patient ID: [name] [name], male    DOB: 1985/10/09  Age: 32 y.o. MRN: [id number]  CC: Diabetes and Hospitalization Follow-up   HPI [name] [name] is a 32 year old male with Type 1 Diabetes Mellitus (A1c 13.0) here to establish care with me. He was recently hospitalized at [facility] from 09/20/17 - 09/22/17 for DKA after presenting with nausea and vomiting and had an anion gap. He was placed on IV fluids, insulin drip which was later transition to subcutaneus insulin with improvement in his sugars. He subsequently left AMA.  He presents today with a CBG of 366 but informs me he has been chewing gum. He has been taking Lantus 15 units at bedtime and Novolog 10 units tid.  His fasting sugars have been in the 120s but during the day his sugars are elevated (above 220) as he snacks a lot at work by eating chips and sweet drinks.  Past Medical History:  Diagnosis Date  . ARF (acute renal failure) ([place]) 2011   pt was on dialysis "for 1 day approx 2011, pt is no longer on dialysis (03/09/2017)  . DKA (diabetic ketoacidoses) ([place])    "several times" (03/09/2017)  . Type I diabetes mellitus ([place]) Dx 2012    Past Surgical History:  Procedure Laterality Date  . NO PAST SURGERIES      No Known Allergies   Outpatient Medications Prior to Visit  Medication Sig Dispense Refill  . Blood Glucose Monitoring Suppl (TRUE METRIX METER) w/Device KIT 1 each by Does not apply route as needed. 1 kit 0  . insulin aspart (NOVOLOG) 100 UNIT/ML injection Inject 15 Units into the skin 3 (three) times daily before meals.    . Insulin Glargine (LANTUS) 100 UNIT/ML Solostar Pen Inject 13 Units into the skin daily at 10 pm. (Patient taking differently: Inject 25 Units into the skin daily at 10 pm. ) 15 mL 11   No facility-administered medications prior to visit.     ROS Review of Systems  Constitutional: Negative for activity change and appetite change.  HENT: Negative for sinus pressure  and sore throat.   Eyes: Negative for visual disturbance.  Respiratory: Negative for cough, chest tightness and shortness of breath.   Cardiovascular: Negative for chest pain and leg swelling.  Gastrointestinal: Negative for abdominal distention, abdominal pain, constipation and diarrhea.  Endocrine: Negative.   Genitourinary: Negative for dysuria.  Musculoskeletal: Negative for joint swelling and myalgias.  Skin: Negative for rash.  Allergic/Immunologic: Negative.   Neurological: Negative for weakness, light-headedness and numbness.  Psychiatric/Behavioral: Negative for dysphoric mood and suicidal ideas.    Objective:  BP 129/74   Pulse 91   Temp 97.8 F (36.6 C) (Oral)   Wt 113 lb (51.3 kg)   SpO2 100%   BMI 18.24 kg/m   BP/Weight 09/28/2017 05/28/3843 07/26/4678  Systolic BP 321 224 -  Diastolic BP 74 92 -  Wt. (Lbs) 113 - 104.72  BMI 18.24 - 16.9     Physical Exam  Constitutional: He is oriented to person, place, and time. He appears well-developed and well-nourished.  Cardiovascular: Normal rate, normal heart sounds and intact distal pulses.  No murmur heard. Pulmonary/Chest: Effort normal and breath sounds normal. He has no wheezes. He has no rales. He exhibits no tenderness.  Abdominal: Soft. Bowel sounds are normal. He exhibits no distension and no mass. There is no tenderness.  Musculoskeletal: Normal range of motion.  Neurological: He is alert and oriented to person,  place, and time.  Skin: Skin is warm and dry.  Psychiatric: He has a normal mood and affect.     Lab Results  Component Value Date   HGBA1C 13.0 (H) 09/20/2017    Assessment & Plan:   1. Type 1 diabetes mellitus with other specified complication (HCC) Uncontrolled with A1c of 13.0 He has been having daytime hyperglycemias Will make Lantus twice daily and switch Novolog to sliding scale Will review blood sugar log at next visit - POCT glucose (manual entry) - Insulin Glargine (LANTUS) 100  UNIT/ML Solostar Pen; Inject subcutaneously twice daily 15 units in the evening 10 units in the morning  Dispense: 15 mL; Refill: 3 - insulin aspart (NOVOLOG) 100 UNIT/ML injection; Inject 0-12 Units into the skin 3 (three) times daily before meals. Per sliding scale  Dispense: 30 mL; Refill: 3   Meds ordered this encounter  Medications  . Insulin Glargine (LANTUS) 100 UNIT/ML Solostar Pen    Sig: Inject subcutaneously twice daily 15 units in the evening 10 units in the morning    Dispense:  15 mL    Refill:  3  . insulin aspart (NOVOLOG) 100 UNIT/ML injection    Sig: Inject 0-12 Units into the skin 3 (three) times daily before meals. Per sliding scale    Dispense:  30 mL    Refill:  3    Follow-up: Return in about 1 month (around 10/29/2017) for Follow-up of diabetes mellitus.   Charlott Rakes MD

## 2017-09-28 NOTE — Patient Instructions (Signed)
Diabetes Mellitus and Nutrition When you have diabetes (diabetes mellitus), it is very important to have healthy eating habits because your blood sugar (glucose) levels are greatly affected by what you eat and drink. Eating healthy foods in the appropriate amounts, at about the same times every day, can help you:  Control your blood glucose.  Lower your risk of heart disease.  Improve your blood pressure.  Reach or maintain a healthy weight.  Every person with diabetes is different, and each person has different needs for a meal plan. Your health care provider may recommend that you work with a diet and nutrition specialist (dietitian) to make a meal plan that is best for you. Your meal plan may vary depending on factors such as:  The calories you need.  The medicines you take.  Your weight.  Your blood glucose, blood pressure, and cholesterol levels.  Your activity level.  Other health conditions you have, such as heart or kidney disease.  How do carbohydrates affect me? Carbohydrates affect your blood glucose level more than any other type of food. Eating carbohydrates naturally increases the amount of glucose in your blood. Carbohydrate counting is a method for keeping track of how many carbohydrates you eat. Counting carbohydrates is important to keep your blood glucose at a healthy level, especially if you use insulin or take certain oral diabetes medicines. It is important to know how many carbohydrates you can safely have in each meal. This is different for every person. Your dietitian can help you calculate how many carbohydrates you should have at each meal and for snack. Foods that contain carbohydrates include:  Bread, cereal, rice, pasta, and crackers.  Potatoes and corn.  Peas, beans, and lentils.  Milk and yogurt.  Fruit and juice.  Desserts, such as cakes, cookies, ice cream, and candy.  How does alcohol affect me? Alcohol can cause a sudden decrease in blood  glucose (hypoglycemia), especially if you use insulin or take certain oral diabetes medicines. Hypoglycemia can be a life-threatening condition. Symptoms of hypoglycemia (sleepiness, dizziness, and confusion) are similar to symptoms of having too much alcohol. If your health care provider says that alcohol is safe for you, follow these guidelines:  Limit alcohol intake to no more than 1 drink per day for nonpregnant women and 2 drinks per day for men. One drink equals 12 oz of beer, 5 oz of wine, or 1 oz of hard liquor.  Do not drink on an empty stomach.  Keep yourself hydrated with water, diet soda, or unsweetened iced tea.  Keep in mind that regular soda, juice, and other mixers may contain a lot of sugar and must be counted as carbohydrates.  What are tips for following this plan? Reading food labels  Start by checking the serving size on the label. The amount of calories, carbohydrates, fats, and other nutrients listed on the label are based on one serving of the food. Many foods contain more than one serving per package.  Check the total grams (g) of carbohydrates in one serving. You can calculate the number of servings of carbohydrates in one serving by dividing the total carbohydrates by 15. For example, if a food has 30 g of total carbohydrates, it would be equal to 2 servings of carbohydrates.  Check the number of grams (g) of saturated and trans fats in one serving. Choose foods that have low or no amount of these fats.  Check the number of milligrams (mg) of sodium in one serving. Most people   should limit total sodium intake to less than 2,300 mg per day.  Always check the nutrition information of foods labeled as "low-fat" or "nonfat". These foods may be higher in added sugar or refined carbohydrates and should be avoided.  Talk to your dietitian to identify your daily goals for nutrients listed on the label. Shopping  Avoid buying canned, premade, or processed foods. These  foods tend to be high in fat, sodium, and added sugar.  Shop around the outside edge of the grocery store. This includes fresh fruits and vegetables, bulk grains, fresh meats, and fresh dairy. Cooking  Use low-heat cooking methods, such as baking, instead of high-heat cooking methods like deep frying.  Cook using healthy oils, such as olive, canola, or sunflower oil.  Avoid cooking with butter, cream, or high-fat meats. Meal planning  Eat meals and snacks regularly, preferably at the same times every day. Avoid going long periods of time without eating.  Eat foods high in fiber, such as fresh fruits, vegetables, beans, and whole grains. Talk to your dietitian about how many servings of carbohydrates you can eat at each meal.  Eat 4-6 ounces of lean protein each day, such as lean meat, chicken, fish, eggs, or tofu. 1 ounce is equal to 1 ounce of meat, chicken, or fish, 1 egg, or 1/4 cup of tofu.  Eat some foods each day that contain healthy fats, such as avocado, nuts, seeds, and fish. Lifestyle   Check your blood glucose regularly.  Exercise at least 30 minutes 5 or more days each week, or as told by your health care provider.  Take medicines as told by your health care provider.  Do not use any products that contain nicotine or tobacco, such as cigarettes and e-cigarettes. If you need help quitting, ask your health care provider.  Work with a counselor or diabetes educator to identify strategies to manage stress and any emotional and social challenges. What are some questions to ask my health care provider?  Do I need to meet with a diabetes educator?  Do I need to meet with a dietitian?  What number can I call if I have questions?  When are the best times to check my blood glucose? Where to find more information:  American Diabetes Association: diabetes.org/food-and-fitness/food  Academy of Nutrition and Dietetics:  www.eatright.org/resources/health/diseases-and-conditions/diabetes  National Institute of Diabetes and Digestive and Kidney Diseases (NIH): www.niddk.nih.gov/health-information/diabetes/overview/diet-eating-physical-activity Summary  A healthy meal plan will help you control your blood glucose and maintain a healthy lifestyle.  Working with a diet and nutrition specialist (dietitian) can help you make a meal plan that is best for you.  Keep in mind that carbohydrates and alcohol have immediate effects on your blood glucose levels. It is important to count carbohydrates and to use alcohol carefully. This information is not intended to replace advice given to you by your health care provider. Make sure you discuss any questions you have with your health care provider. Document Released: 02/02/2005 Document Revised: 06/12/2016 Document Reviewed: 06/12/2016 Elsevier Interactive Patient Education  2018 Elsevier Inc.  

## 2017-09-30 ENCOUNTER — Encounter: Payer: Self-pay | Admitting: Family Medicine

## 2017-10-03 MED FILL — !NOVOLOG 100UNITS/ML VIAL: 100/ML | 27 days supply | Qty: 10 | Fill #0

## 2017-10-03 MED FILL — !LANTUS SOLOSTAR 100UNITS/M: 100 | 24 days supply | Qty: 6 | Fill #0

## 2017-10-19 ENCOUNTER — Ambulatory Visit: Payer: Self-pay | Admitting: Internal Medicine

## 2017-11-29 ENCOUNTER — Inpatient Hospital Stay (HOSPITAL_COMMUNITY)
Admission: EM | Admit: 2017-11-29 | Discharge: 2017-12-01 | DRG: 639 | Disposition: A | Discharge status: Home / Self Care | Payer: Self-pay | Attending: Family Medicine | Admitting: Family Medicine

## 2017-11-29 ENCOUNTER — Other Ambulatory Visit: Payer: Self-pay

## 2017-11-29 DIAGNOSIS — E1043 Type 1 diabetes mellitus with diabetic autonomic (poly)neuropathy: Secondary | ICD-10-CM | POA: Diagnosis present

## 2017-11-29 DIAGNOSIS — R739 Hyperglycemia, unspecified: Secondary | ICD-10-CM

## 2017-11-29 DIAGNOSIS — K3184 Gastroparesis: Secondary | ICD-10-CM | POA: Diagnosis present

## 2017-11-29 DIAGNOSIS — Z87891 Personal history of nicotine dependence: Secondary | ICD-10-CM

## 2017-11-29 DIAGNOSIS — Z833 Family history of diabetes mellitus: Secondary | ICD-10-CM

## 2017-11-29 DIAGNOSIS — E860 Dehydration: Secondary | ICD-10-CM

## 2017-11-29 DIAGNOSIS — E1010 Type 1 diabetes mellitus with ketoacidosis without coma: Principal | ICD-10-CM | POA: Diagnosis present

## 2017-11-29 DIAGNOSIS — R112 Nausea with vomiting, unspecified: Secondary | ICD-10-CM | POA: Diagnosis present

## 2017-11-29 DIAGNOSIS — Z794 Long term (current) use of insulin: Secondary | ICD-10-CM

## 2017-11-29 DIAGNOSIS — E1110 Type 2 diabetes mellitus with ketoacidosis without coma: Secondary | ICD-10-CM | POA: Diagnosis present

## 2017-11-29 DIAGNOSIS — Z9119 Patient's noncompliance with other medical treatment and regimen: Secondary | ICD-10-CM

## 2017-11-29 DIAGNOSIS — E108 Type 1 diabetes mellitus with unspecified complications: Secondary | ICD-10-CM

## 2017-11-29 LAB — BLOOD GAS, VENOUS
Acid-base deficit: 7.1 mmol/L — ABNORMAL HIGH (ref 0.0–2.0)
Bicarbonate: 17.9 mmol/L — ABNORMAL LOW (ref 20.0–28.0)
FIO2: 21
O2 SAT: 72.6 %
PATIENT TEMPERATURE: 98.5
pCO2, Ven: 36.2 mmHg — ABNORMAL LOW (ref 44.0–60.0)
pH, Ven: 7.315 (ref 7.250–7.430)
pO2, Ven: 41.8 mmHg (ref 32.0–45.0)

## 2017-11-29 LAB — CBG MONITORING, ED
GLUCOSE-CAPILLARY: 246 mg/dL — AB (ref 70–99)
GLUCOSE-CAPILLARY: 189 mg/dL — AB (ref 70–99)
Glucose-Capillary: 166 mg/dL — ABNORMAL HIGH (ref 70–99)
Glucose-Capillary: 168 mg/dL — ABNORMAL HIGH (ref 70–99)
Glucose-Capillary: 271 mg/dL — ABNORMAL HIGH (ref 70–99)

## 2017-11-29 LAB — CBC
HCT: 43.9 % (ref 39.0–52.0)
HEMOGLOBIN: 15.1 g/dL (ref 13.0–17.0)
MCH: 30 pg (ref 26.0–34.0)
MCHC: 34.4 g/dL (ref 30.0–36.0)
MCV: 87.1 fL (ref 78.0–100.0)
Platelets: 278 10*3/uL (ref 150–400)
RBC: 5.04 MIL/uL (ref 4.22–5.81)
RDW: 12.6 % (ref 11.5–15.5)
WBC: 8.9 10*3/uL (ref 4.0–10.5)

## 2017-11-29 LAB — COMPREHENSIVE METABOLIC PANEL
ALBUMIN: 4.5 g/dL (ref 3.5–5.0)
ALK PHOS: 68 U/L (ref 38–126)
ALT: 28 U/L (ref 0–44)
ANION GAP: 18 — AB (ref 5–15)
AST: 22 U/L (ref 15–41)
BUN: 23 mg/dL — AB (ref 6–20)
CALCIUM: 9.1 mg/dL (ref 8.9–10.3)
CO2: 18 mmol/L — AB (ref 22–32)
CREATININE: 0.81 mg/dL (ref 0.61–1.24)
Chloride: 100 mmol/L (ref 98–111)
GFR calc Af Amer: >60 mL/min (ref >60)
GFR calc non Af Amer: >60 mL/min (ref >60)
GLUCOSE: 299 mg/dL — AB (ref 70–99)
Potassium: 4.2 mmol/L (ref 3.5–5.1)
SODIUM: 136 mmol/L (ref 135–145)
Total Bilirubin: 1.4 mg/dL — ABNORMAL HIGH (ref 0.3–1.2)
Total Protein: 8 g/dL (ref 6.5–8.1)

## 2017-11-29 LAB — BASIC METABOLIC PANEL
Anion gap: 7 (ref 5–15)
BUN: 19 mg/dL (ref 6–20)
CO2: 22 mmol/L (ref 22–32)
CREATININE: 0.57 mg/dL — AB (ref 0.61–1.24)
Calcium: 7.9 mg/dL — ABNORMAL LOW (ref 8.9–10.3)
Chloride: 111 mmol/L (ref 98–111)
GFR calc Af Amer: >60 mL/min (ref >60)
GLUCOSE: 176 mg/dL — AB (ref 70–99)
POTASSIUM: 3.6 mmol/L (ref 3.5–5.1)
SODIUM: 140 mmol/L (ref 135–145)

## 2017-11-29 LAB — LIPASE, BLOOD: Lipase: 38 U/L (ref 11–51)

## 2017-11-29 MED ORDER — SODIUM CHLORIDE 0.9 % IV SOLN: Freq: Once | INTRAVENOUS | Status: DC

## 2017-11-29 MED ORDER — SODIUM CHLORIDE 0.9 % IV BOLUS
1000.0000 mL | Freq: Once | INTRAVENOUS | Status: AC
Start: 2017-11-29 — End: 2017-11-29
  Administered 2017-11-29: 1000 mL via INTRAVENOUS

## 2017-11-29 MED ORDER — ONDANSETRON HCL 4 MG/2ML IJ SOLN
4.0000 mg | Freq: Once | INTRAMUSCULAR | Status: DC
Start: 2017-11-29 — End: 2017-11-30
  Filled 2017-11-29: qty 2

## 2017-11-29 MED ORDER — ONDANSETRON HCL 4 MG/2ML IJ SOLN
4.0000 mg | Freq: Once | INTRAMUSCULAR | Status: AC
  Administered 2017-11-29: 4 mg via INTRAVENOUS
  Filled 2017-11-29: qty 2

## 2017-11-29 MED ORDER — DEXTROSE-NACL 5-0.45 % IV SOLN
INTRAVENOUS | Status: DC
  Administered 2017-11-29: 22:00:00 via INTRAVENOUS

## 2017-11-29 MED ORDER — INSULIN GLARGINE 100 UNIT/ML ~~LOC~~ SOLN
10.0000 [IU] | Freq: Every day | SUBCUTANEOUS | Status: DC
  Administered 2017-11-30 (×2): 10 [IU] via SUBCUTANEOUS
  Filled 2017-11-29 (×2): qty 0.1

## 2017-11-29 MED ORDER — SODIUM CHLORIDE 0.9 % IV SOLN
INTRAVENOUS | Status: DC
  Administered 2017-11-29: 2.4 [IU]/h via INTRAVENOUS
  Filled 2017-11-29: qty 1

## 2017-11-29 MED ORDER — SODIUM CHLORIDE 0.9 % IV BOLUS
1000.0000 mL | Freq: Once | INTRAVENOUS | Status: AC
  Administered 2017-11-29: 1000 mL via INTRAVENOUS

## 2017-11-29 NOTE — ED Provider Notes (Signed)
[facility] DEPT Provider Note   CSN: [id number] Arrival date & time: 11/29/17  1748     History   Chief Complaint Chief Complaint  Patient presents with  . Emesis  . Nausea    HPI Famous Eisenhardt is a 32 y.o. male.  HPI   32 year old male with extensive past medical history of type 1 diabetes with recurrent DKA, renal failure, chronic nonadherence, here with nausea and vomiting.  The patient states that for the last several days, she has had persistent, severe, nausea and vomiting.  He has been unable to eat or drink.  He is began to get short of breath.  His blood sugars have gone up to the 300s and 400s, despite not eating throughout the day.  He feels his symptoms feel similar to his previous episodes of DKA.  Is not been able to eat or drink.  He has had intermittent cramp-like abdominal pain.  No alleviating factors.  No fevers or chills.  He states his been adherent with this insulin.  Past Medical History:  Diagnosis Date  . ARF (acute renal failure) ([place]) 2011   pt was on dialysis "for 1 day approx 2011, pt is no longer on dialysis (03/09/2017)  . DKA (diabetic ketoacidoses) ([place])    "several times" (03/09/2017)  . Type I diabetes mellitus [facility]) Dx 2012    Patient Active Problem List   Diagnosis Date Noted  . Nausea & vomiting 11/29/2017  . Diabetes mellitus type 1, uncontrolled, with complications ([place])   . Noncompliance by refusing service   . DKA (diabetic ketoacidoses) ([place]) 06/29/2017  . DKA, type 1 ([place]) 03/09/2017  . Hypophosphatemia 08/27/2016  . AKI (acute kidney injury) ([place]) 07/11/2016  . Hyperkalemia 03/09/2016  . Leukocytosis 03/09/2016  . Diabetic ketoacidosis without coma associated with type 1 diabetes mellitus ([place])   . Non-intractable vomiting with nausea   . Chest pain 12/18/2015  . Marijuana use 07/22/2015  . Proteinuria with type 1 diabetes mellitus ([place]) 07/22/2015  . Hypertension complicating diabetes ([place])  07/22/2015  . Uncontrolled type 1 diabetes mellitus with ketoacidosis without coma ([place])   . Hypocalcemia   . Acute renal failure ([place])   . Hypokalemia   . Hypomagnesemia   . Abnormal EKG 06/15/2015  . Sinus tachycardia   . Tachypnea   . Tinea pedis 12/17/2014  . Adjustment disorder with mixed anxiety and depressed mood 09/03/2014  . Protein-calorie malnutrition, severe ([place]) 07/06/2014  . DM (diabetes mellitus), type 1, uncontrolled ([place]) 05/01/2014  . HLD (hyperlipidemia) 04/26/2014  . Substance abuse ([place])   . Dehydration 07/07/2013  . Fatigue 03/10/2012    Past Surgical History:  Procedure Laterality Date  . NO PAST SURGERIES          Home Medications    Prior to Admission medications   Medication Sig Start Date End Date Taking? Authorizing Provider  insulin aspart (NOVOLOG) 100 UNIT/ML injection Inject 0-12 Units into the skin 3 (three) times daily before meals. Per sliding scale 09/28/17  Yes Charlott Rakes, MD  Insulin Glargine (LANTUS) 100 UNIT/ML Solostar Pen Inject subcutaneously twice daily 15 units in the evening 10 units in the morning 09/28/17  Yes Newlin, Enobong, MD  Blood Glucose Monitoring Suppl (TRUE METRIX METER) w/Device KIT 1 each by Does not apply route as needed. 07/12/16   Florencia Reasons, MD    Family History Family History  Problem Relation Age of Onset  . Diabetes Father   . Diabetes Maternal Grandmother   .  Heart disease Neg Hx   . Hypertension Neg Hx     Social History Social History   Tobacco Use  . Smoking status: Former Smoker    Packs/day: 0.25    Years: 10.00    Pack years: 2.50    Types: Cigarettes    Last attempt to quit: 09/19/2013    Years since quitting: 4.1  . Smokeless tobacco: Never Used  Substance Use Topics  . Alcohol use: Yes    Alcohol/week: 0.0 oz    Comment: 03/09/2017 "normally I don't drink; drank 3 shots liquor 2 days ago"  . Drug use: No     Allergies   Patient has no known allergies.   Review of  Systems Review of Systems  Constitutional: Positive for fatigue. Negative for chills and fever.  HENT: Negative for ear pain and sore throat.   Eyes: Negative for pain and visual disturbance.  Respiratory: Negative for cough and shortness of breath.   Cardiovascular: Negative for chest pain and palpitations.  Gastrointestinal: Positive for nausea and vomiting. Negative for abdominal pain.  Endocrine: Positive for polydipsia, polyphagia and polyuria.  Genitourinary: Negative for dysuria and hematuria.  Musculoskeletal: Negative for arthralgias and back pain.  Skin: Negative for color change and rash.  Neurological: Positive for weakness. Negative for seizures and syncope.     Physical Exam Updated Vital Signs BP 133/80   Pulse 81   Temp 98.5 F (36.9 C) (Oral)   Resp 17   Ht '5\' 6"'$  (1.676 m)   SpO2 97%   BMI 18.24 kg/m   Physical Exam  Constitutional: He is oriented to person, place, and time. He appears well-developed and well-nourished. No distress.  HENT:  Head: Normocephalic and atraumatic.  Dry MM  Eyes: Conjunctivae are normal.  Neck: Neck supple.  Cardiovascular: Normal rate, regular rhythm and normal heart sounds. Exam reveals no friction rub.  No murmur heard. Pulmonary/Chest: Effort normal and breath sounds normal. No respiratory distress. He has no wheezes. He has no rales.  Abdominal: Soft. He exhibits no distension. There is tenderness (minimal, diffuse).  Musculoskeletal: He exhibits no edema.  Neurological: He is alert and oriented to person, place, and time. He exhibits normal muscle tone.  Skin: Skin is warm. Capillary refill takes less than 2 seconds.  Psychiatric: He has a normal mood and affect.  Nursing note and vitals reviewed.    ED Treatments / Results  Labs (all labs ordered are listed, but only abnormal results are displayed) Labs Reviewed  COMPREHENSIVE METABOLIC PANEL - Abnormal; Notable for the following components:      Result Value    CO2 18 (*)    Glucose, Bld 299 (*)    BUN 23 (*)    Total Bilirubin 1.4 (*)    Anion gap 18 (*)    All other components within normal limits  BLOOD GAS, VENOUS - Abnormal; Notable for the following components:   pCO2, Ven 36.2 (*)    Bicarbonate 17.9 (*)    Acid-base deficit 7.1 (*)    All other components within normal limits  BASIC METABOLIC PANEL - Abnormal; Notable for the following components:   Glucose, Bld 176 (*)    Creatinine, Ser 0.57 (*)    Calcium 7.9 (*)    All other components within normal limits  CBG MONITORING, ED - Abnormal; Notable for the following components:   Glucose-Capillary 271 (*)    All other components within normal limits  CBG MONITORING, ED - Abnormal; Notable for  the following components:   Glucose-Capillary 246 (*)    All other components within normal limits  CBG MONITORING, ED - Abnormal; Notable for the following components:   Glucose-Capillary 189 (*)    All other components within normal limits  CBG MONITORING, ED - Abnormal; Notable for the following components:   Glucose-Capillary 166 (*)    All other components within normal limits  CBG MONITORING, ED - Abnormal; Notable for the following components:   Glucose-Capillary 168 (*)    All other components within normal limits  LIPASE, BLOOD  CBC  URINALYSIS, ROUTINE W REFLEX MICROSCOPIC    EKG None  Radiology No results found.  Procedures .Critical Care Performed by: Duffy Bruce, MD Authorized by: Duffy Bruce, MD   Critical care provider statement:    Critical care time (minutes):  35   Critical care time was exclusive of:  Separately billable procedures and treating other patients and teaching time   Critical care was necessary to treat or prevent imminent or life-threatening deterioration of the following conditions:  Metabolic crisis   Critical care was time spent personally by me on the following activities:  Development of treatment plan with patient or surrogate,  discussions with consultants, evaluation of patient's response to treatment, examination of patient, obtaining history from patient or surrogate, ordering and performing treatments and interventions, ordering and review of laboratory studies, ordering and review of radiographic studies, pulse oximetry, re-evaluation of patient's condition and review of old charts   I assumed direction of critical care for this patient from another provider in my specialty: no     (including critical care time)  Medications Ordered in ED Medications  dextrose 5 %-0.45 % sodium chloride infusion ( Intravenous New Bag/Given 11/29/17 2135)  insulin regular (NOVOLIN R,HUMULIN R) 100 Units in sodium chloride 0.9 % 100 mL (1 Units/mL) infusion (2.1 Units/hr Intravenous Rate/Dose Change 11/29/17 2236)  0.9 %  sodium chloride infusion ( Intravenous Hold 11/29/17 2027)  ondansetron (ZOFRAN) injection 4 mg (0 mg Intravenous Hold 11/29/17 2235)  insulin glargine (LANTUS) injection 10 Units (has no administration in time range)  sodium chloride 0.9 % bolus 1,000 mL (0 mLs Intravenous Stopped 11/29/17 2135)  ondansetron (ZOFRAN) injection 4 mg (4 mg Intravenous Given 11/29/17 2001)  sodium chloride 0.9 % bolus 1,000 mL (0 mLs Intravenous Stopped 11/29/17 2251)     Initial Impression / Assessment and Plan / ED Course  I have reviewed the triage vital signs and the nursing notes.  Pertinent labs & imaging results that were available during my care of the patient were reviewed by me and considered in my medical decision making (see chart for details).     32 year old male with history of recurrent episodes of DKA here with nausea, vomiting, and shortness of breath.  Lab work today shows hyperglycemia, bicarb of 18, and anion gap of 18.  His pH is normal.  Symptoms are highly concerning for early, mild DKA.  Given that his glucose is already 240, will be cautious giving him D5 in addition to placing him on a stabilizer short-term,  to close his gap.  Given his history of recurrent DKA, I think it safest to observe him for hydration and control.  Will consult hospitalist for admission.  Final Clinical Impressions(s) / ED Diagnoses   Final diagnoses:  Hyperglycemia  Dehydration  Type 1 diabetes mellitus with complication [facility])    ED Discharge Orders    None       Duffy Bruce, MD 11/29/17  2348  

## 2017-11-29 NOTE — ED Notes (Signed)
ED TO INPATIENT HANDOFF REPORT  Name/Age/Gender [name] [name] 32 y.o. male  Code Status Code Status History    Date Active Date Inactive Code Status Order ID Comments User Context   09/20/2017 1144 09/22/2017 1433 Full Code [id number]  Lady Deutscher, MD ED   08/21/2017 1146 08/23/2017 1849 Full Code [id number]  Georgette Shell, MD ED   06/29/2017 1331 07/01/2017 1918 Full Code [id number]  Cristy Folks, MD ED   03/09/2017 1158 03/12/2017 1552 Full Code [id number]  Reyne Dumas, MD ED   10/29/2016 2127 10/30/2016 1701 Full Code [id number]  Karmen Bongo, MD Inpatient   08/26/2016 1441 08/28/2016 1439 Full Code [id number]  Truett Mainland, DO Inpatient   07/11/2016 2138 07/12/2016 1951 Full Code [id number]  Lily Kocher, MD Inpatient   07/11/2016 2138 07/11/2016 2138 Full Code [id number]  Lily Kocher, MD Inpatient   05/29/2016 2319 05/31/2016 1759 Full Code [id number]  Toy Baker, MD Inpatient   03/09/2016 1135 03/11/2016 1625 Full Code [id number]  Debbe Odea, MD ED   12/18/2015 1217 12/19/2015 1918 Full Code [id number]  Mariel Aloe, MD Inpatient   12/01/2015 2310 12/03/2015 2130 Full Code [id number]  Rise Patience, MD ED   07/22/2015 0927 07/24/2015 1812 Full Code [id number]  Juluis Mire, MD ED   06/15/2015 2217 06/18/2015 1314 Full Code [id number]  Reubin Milan, MD Inpatient   06/06/2015 0200 06/07/2015 2026 Full Code [id number]  Vianne Bulls, MD ED   03/22/2015 1118 03/24/2015 1701 Full Code [id number]  Juluis Mire, MD ED   09/30/2014 0701 10/02/2014 1746 Full Code [id number]  Juanito Doom, MD Inpatient   08/30/2014 1521 09/03/2014 1708 Full Code [id number]  Blain Pais, MD ED   08/13/2014 1956 08/15/2014 1806 Full Code [id number]  Bonnielee Haff, MD ED   07/17/2014 0116 07/20/2014 1604 Full Code [id number]  Allyne Gee, MD Inpatient   07/05/2014 1040 07/08/2014 1533 Full Code [id number]  Marijean Heath, NP ED   07/01/2014 0010 07/02/2014 1404 Full Code [id number]   Allyne Gee, MD Inpatient   04/25/2014 0835 04/28/2014 2113 Full Code [id number]  Allie Bossier, MD Inpatient   04/24/2014 0618 04/25/2014 0835 Full Code [id number]  Rise Patience, MD Inpatient   07/07/2013 2218 07/10/2013 1738 Full Code [id number]  Modena Jansky, MD Inpatient   03/11/2012 0046 03/22/2012 2111 Full Code [id number]  Sharlotte Alamo, RN Inpatient   03/10/2012 2009 03/11/2012 0046 Full Code [id number]  Derrill Kay, MD ED      Home/SNF/Other Home  Chief Complaint Diabete problems, vomiting  Level of Care/Admitting Diagnosis ED Disposition    ED Disposition Condition [place]: [facility] [[id number]]  Level of Care: Stepdown [14]  Admit to SDU based on following criteria: Severe physiological/psychological symptoms:  Any diagnosis requiring assessment & intervention at least every 4 hours on an ongoing basis to obtain desired patient outcomes including stability and rehabilitation  Diagnosis: DKA (diabetic ketoacidoses) [facility]) [[id number]]  Admitting Physician: Rise Patience [phone]  Attending Physician: Rise Patience [phone]  Estimated length of stay: past midnight tomorrow  Certification:: I certify this patient will need inpatient services for at least 2 midnights  PT Class (Do Not Modify): Inpatient [101]  PT Acc Code (Do Not Modify): Private [1]       Medical History Past Medical History:  Diagnosis Date  . ARF (acute renal failure) ([place]) 2011   pt was on dialysis "for 1 day  approx 2011, pt is no longer on dialysis (03/09/2017)  . DKA (diabetic ketoacidoses) ([place])    "several times" (03/09/2017)  . Type I diabetes mellitus ([place]) Dx 2012    Allergies No Known Allergies  IV Location/Drains/Wounds Patient Lines/Drains/Airways Status   Active Line/Drains/Airways    Name:   Placement date:   Placement time:   Site:   Days:   Peripheral IV 11/29/17 Left;Medial;Proximal Forearm   11/29/17    1956     Forearm   less than 1          Labs/Imaging Results for orders placed or performed during the hospital encounter of 11/29/17 (from the past 48 hour(s))  CBG monitoring, ED     Status: Abnormal   Collection Time: 11/29/17  5:55 PM  Result Value Ref Range   Glucose-Capillary 271 (H) 70 - 99 mg/dL  Lipase, blood     Status: None   Collection Time: 11/29/17  6:21 PM  Result Value Ref Range   Lipase 38 11 - 51 U/L    Comment: Performed at [facility], [place] [address] [place], [place] [address]  Comprehensive metabolic panel     Status: Abnormal   Collection Time: 11/29/17  6:21 PM  Result Value Ref Range   Sodium 136 135 - 145 mmol/L   Potassium 4.2 3.5 - 5.1 mmol/L   Chloride 100 98 - 111 mmol/L    Comment: Please note change in reference range.   CO2 18 (L) 22 - 32 mmol/L   Glucose, Bld 299 (H) 70 - 99 mg/dL    Comment: Please note change in reference range.   BUN 23 (H) 6 - 20 mg/dL    Comment: Please note change in reference range.   Creatinine, Ser 0.81 0.61 - 1.24 mg/dL   Calcium 9.1 8.9 - 10.3 mg/dL   Total Protein 8.0 6.5 - 8.1 g/dL   Albumin 4.5 3.5 - 5.0 g/dL   AST 22 15 - 41 U/L   ALT 28 0 - 44 U/L    Comment: Please note change in reference range.   Alkaline Phosphatase 68 38 - 126 U/L   Total Bilirubin 1.4 (H) 0.3 - 1.2 mg/dL   GFR calc non Af Amer >60 >60 mL/min   GFR calc Af Amer >60 >60 mL/min    Comment: (NOTE) The eGFR has been calculated using the CKD EPI equation. This calculation has not been validated in all clinical situations. eGFR's persistently <60 mL/min signify possible Chronic Kidney Disease.    Anion gap 18 (H) 5 - 15    Comment: Performed at [facility], [place] [address], [place], [place] [address]  CBC     Status: None   Collection Time: 11/29/17  6:21 PM  Result Value Ref Range   WBC 8.9 4.0 - 10.5 K/uL   RBC 5.04 4.22 - 5.81 MIL/uL   Hemoglobin 15.1 13.0 - 17.0 g/dL   HCT 43.9 39.0 - 52.0 %   MCV  87.1 78.0 - 100.0 fL   MCH 30.0 26.0 - 34.0 pg   MCHC 34.4 30.0 - 36.0 g/dL   RDW 12.6 11.5 - 15.5 %   Platelets 278 150 - 400 K/uL    Comment: Performed at [facility], [place] [address], [place], [place] [address]  Blood gas, venous     Status: Abnormal   Collection Time: 11/29/17  7:52 PM  Result Value Ref Range   FIO2 21.00  pH, Ven 7.315 7.250 - 7.430   pCO2, Ven 36.2 (L) 44.0 - 60.0 mmHg   pO2, Ven 41.8 32.0 - 45.0 mmHg   Bicarbonate 17.9 (L) 20.0 - 28.0 mmol/L   Acid-base deficit 7.1 (H) 0.0 - 2.0 mmol/L   O2 Saturation 72.6 %   Patient temperature 98.5    Collection site VEIN    Drawn by DRAWN BY RN    Sample type VENOUS     Comment: Performed at [facility], [place] [address] [place], [place] [address]  POC CBG, ED     Status: Abnormal   Collection Time: 11/29/17  8:24 PM  Result Value Ref Range   Glucose-Capillary 246 (H) 70 - 99 mg/dL  CBG monitoring, ED     Status: Abnormal   Collection Time: 11/29/17  9:29 PM  Result Value Ref Range   Glucose-Capillary 189 (H) 70 - 99 mg/dL  CBG monitoring, ED     Status: Abnormal   Collection Time: 11/29/17 10:32 PM  Result Value Ref Range   Glucose-Capillary 166 (H) 70 - 99 mg/dL   No results found.  Pending Labs Unresulted Labs (From admission, onward)   Start     Ordered   11/29/17 0947  Basic metabolic panel  STAT,   STAT     11/29/17 2147   11/29/17 1801  Urinalysis, Routine w reflex microscopic  STAT,   STAT     11/29/17 1800      Vitals/Pain Today's Vitals   11/29/17 2015 11/29/17 2100 11/29/17 2145 11/29/17 2230  BP: (!) 125/99 (!) 137/96 120/86 (!) 118/93  Pulse: 93 89 81 (!) 103  Resp: '20 18 15 17  '$ Temp:      TempSrc:      SpO2: 100% 99% 98% 99%  Height:      PainSc:        Isolation Precautions No active isolations  Medications Medications  dextrose 5 %-0.45 % sodium chloride infusion ( Intravenous New Bag/Given 11/29/17 2135)  insulin regular (NOVOLIN  R,HUMULIN R) 100 Units in sodium chloride 0.9 % 100 mL (1 Units/mL) infusion (2.1 Units/hr Intravenous Rate/Dose Change 11/29/17 2236)  0.9 %  sodium chloride infusion ( Intravenous Hold 11/29/17 2027)  ondansetron (ZOFRAN) injection 4 mg (0 mg Intravenous Hold 11/29/17 2235)  sodium chloride 0.9 % bolus 1,000 mL (0 mLs Intravenous Stopped 11/29/17 2135)  ondansetron (ZOFRAN) injection 4 mg (4 mg Intravenous Given 11/29/17 2001)  sodium chloride 0.9 % bolus 1,000 mL (0 mLs Intravenous Stopped 11/29/17 2251)    Mobility walks

## 2017-11-29 NOTE — ED Triage Notes (Signed)
Pt reports he is a type 1 diabetic and "just feels terrible" Pt reports his sugars have been averaging around 250 and he has been having emesis and nausea x2 days.

## 2017-11-30 ENCOUNTER — Encounter (HOSPITAL_COMMUNITY): Payer: Self-pay | Admitting: Internal Medicine

## 2017-11-30 ENCOUNTER — Other Ambulatory Visit: Payer: Self-pay

## 2017-11-30 DIAGNOSIS — E0810 Diabetes mellitus due to underlying condition with ketoacidosis without coma: Secondary | ICD-10-CM

## 2017-11-30 DIAGNOSIS — R112 Nausea with vomiting, unspecified: Secondary | ICD-10-CM

## 2017-11-30 LAB — URINALYSIS, ROUTINE W REFLEX MICROSCOPIC
Bacteria, UA: NONE SEEN
Bilirubin Urine: NEGATIVE
Hgb urine dipstick: NEGATIVE
KETONES UR: 80 mg/dL — AB
Leukocytes, UA: NEGATIVE
Nitrite: NEGATIVE
PH: 6 (ref 5.0–8.0)
Protein, ur: 30 mg/dL — AB
SPECIFIC GRAVITY, URINE: 1.028 (ref 1.005–1.030)

## 2017-11-30 LAB — CBC
HCT: 36.6 % — ABNORMAL LOW (ref 39.0–52.0)
HEMATOCRIT: 40 % (ref 39.0–52.0)
HEMOGLOBIN: 12.9 g/dL — AB (ref 13.0–17.0)
HEMOGLOBIN: 14 g/dL (ref 13.0–17.0)
MCH: 30.4 pg (ref 26.0–34.0)
MCH: 30.4 pg (ref 26.0–34.0)
MCHC: 35.2 g/dL (ref 30.0–36.0)
MCHC: 35 g/dL (ref 30.0–36.0)
MCV: 86.1 fL (ref 78.0–100.0)
MCV: 87 fL (ref 78.0–100.0)
Platelets: 216 10*3/uL (ref 150–400)
Platelets: 216 10*3/uL (ref 150–400)
RBC: 4.25 MIL/uL (ref 4.22–5.81)
RBC: 4.6 MIL/uL (ref 4.22–5.81)
RDW: 12.5 % (ref 11.5–15.5)
RDW: 12.5 % (ref 11.5–15.5)
WBC: 7.7 10*3/uL (ref 4.0–10.5)
WBC: 7.4 10*3/uL (ref 4.0–10.5)

## 2017-11-30 LAB — BASIC METABOLIC PANEL
Anion gap: 13 (ref 5–15)
Anion gap: 13 (ref 5–15)
BUN: 17 mg/dL (ref 6–20)
BUN: 15 mg/dL (ref 6–20)
CHLORIDE: 107 mmol/L (ref 98–111)
CHLORIDE: 102 mmol/L (ref 98–111)
CO2: 18 mmol/L — ABNORMAL LOW (ref 22–32)
CO2: 19 mmol/L — ABNORMAL LOW (ref 22–32)
CREATININE: 0.64 mg/dL (ref 0.61–1.24)
Calcium: 8.3 mg/dL — ABNORMAL LOW (ref 8.9–10.3)
Calcium: 8 mg/dL — ABNORMAL LOW (ref 8.9–10.3)
Creatinine, Ser: 0.54 mg/dL — ABNORMAL LOW (ref 0.61–1.24)
GFR calc Af Amer: >60 mL/min (ref >60)
GFR calc non Af Amer: >60 mL/min (ref >60)
GLUCOSE: 181 mg/dL — AB (ref 70–99)
Glucose, Bld: 130 mg/dL — ABNORMAL HIGH (ref 70–99)
POTASSIUM: 3.5 mmol/L (ref 3.5–5.1)
POTASSIUM: 3.9 mmol/L (ref 3.5–5.1)
SODIUM: 138 mmol/L (ref 135–145)
SODIUM: 134 mmol/L — AB (ref 135–145)

## 2017-11-30 LAB — GLUCOSE, CAPILLARY
Glucose-Capillary: 130 mg/dL — ABNORMAL HIGH (ref 70–99)
Glucose-Capillary: 100 mg/dL — ABNORMAL HIGH (ref 70–99)
Glucose-Capillary: 113 mg/dL — ABNORMAL HIGH (ref 70–99)
Glucose-Capillary: 132 mg/dL — ABNORMAL HIGH (ref 70–99)
Glucose-Capillary: 166 mg/dL — ABNORMAL HIGH (ref 70–99)

## 2017-11-30 LAB — CK: CK TOTAL: 60 U/L (ref 49–397)

## 2017-11-30 LAB — CBG MONITORING, ED
GLUCOSE-CAPILLARY: 165 mg/dL — AB (ref 70–99)
GLUCOSE-CAPILLARY: 168 mg/dL — AB (ref 70–99)
Glucose-Capillary: 158 mg/dL — ABNORMAL HIGH (ref 70–99)

## 2017-11-30 LAB — CREATININE, SERUM: Creatinine, Ser: 0.57 mg/dL — ABNORMAL LOW (ref 0.61–1.24)

## 2017-11-30 LAB — HIV ANTIBODY (ROUTINE TESTING W REFLEX): HIV SCREEN 4TH GENERATION: NONREACTIVE

## 2017-11-30 LAB — TROPONIN I

## 2017-11-30 MED ORDER — BISACODYL 10 MG RE SUPP
10.0000 mg | Freq: Once | RECTAL | Status: AC
Start: 2017-11-30 — End: 2017-11-30
  Administered 2017-11-30: 10 mg via RECTAL
  Filled 2017-11-30: qty 1

## 2017-11-30 MED ORDER — GLUCERNA SHAKE PO LIQD
237.0000 mL | Freq: Three times a day (TID) | ORAL | Status: DC
  Administered 2017-11-30 (×2): 237 mL via ORAL
  Filled 2017-11-30 (×4): qty 237

## 2017-11-30 MED ORDER — ACETAMINOPHEN 650 MG RE SUPP: 650.0000 mg | Freq: Four times a day (QID) | RECTAL | Status: DC | PRN

## 2017-11-30 MED ORDER — ENOXAPARIN SODIUM 40 MG/0.4ML ~~LOC~~ SOLN
40.0000 mg | Freq: Every day | SUBCUTANEOUS | Status: DC
  Administered 2017-11-30: 40 mg via SUBCUTANEOUS
  Filled 2017-11-30 (×2): qty 0.4

## 2017-11-30 MED ORDER — LACTATED RINGERS IV SOLN
INTRAVENOUS | Status: AC
  Administered 2017-11-30 (×2): via INTRAVENOUS

## 2017-11-30 MED ORDER — ACETAMINOPHEN 325 MG PO TABS: 650.0000 mg | ORAL_TABLET | Freq: Four times a day (QID) | ORAL | Status: DC | PRN

## 2017-11-30 MED ORDER — POLYETHYLENE GLYCOL 3350 17 G PO PACK
17.0000 g | PACK | Freq: Every day | ORAL | Status: DC
  Administered 2017-11-30 – 2017-12-01 (×2): 17 g via ORAL
  Filled 2017-11-30 (×2): qty 1

## 2017-11-30 MED ORDER — MAGNESIUM HYDROXIDE 400 MG/5ML PO SUSP: 15.0000 mL | Freq: Every day | ORAL | Status: DC | PRN

## 2017-11-30 MED ORDER — ONDANSETRON HCL 4 MG PO TABS: 4.0000 mg | ORAL_TABLET | Freq: Four times a day (QID) | ORAL | Status: DC | PRN

## 2017-11-30 MED ORDER — ONDANSETRON HCL 4 MG/2ML IJ SOLN
4.0000 mg | Freq: Four times a day (QID) | INTRAMUSCULAR | Status: DC | PRN
Start: 2017-11-30 — End: 2017-12-01
  Administered 2017-11-30: 4 mg via INTRAVENOUS
  Filled 2017-11-30: qty 2

## 2017-11-30 MED ORDER — INSULIN ASPART 100 UNIT/ML ~~LOC~~ SOLN
0.0000 [IU] | SUBCUTANEOUS | Status: DC
  Administered 2017-11-30 (×2): 1 [IU] via SUBCUTANEOUS
  Administered 2017-11-30: 2 [IU] via SUBCUTANEOUS
  Administered 2017-12-01 (×2): 1 [IU] via SUBCUTANEOUS
  Administered 2017-12-01: 2 [IU] via SUBCUTANEOUS

## 2017-11-30 NOTE — H&P (Signed)
History and Physical    Maruice Pieroni GEZ:[id number] DOB: 1986/04/03 DOA: 11/29/2017  PCP: Charlott Rakes, MD  Patient coming from: Home.  Chief Complaint: Nausea vomiting elevated blood sugar and weakness.  HPI: [name] [name] is a 32 y.o. male with diabetes mellitus type 1 presents to the ER with complaints of having persistent nausea vomiting over the last 2 days with generalized weakness and elevated blood sugar.  Patient states he has been compliant with his home medications of Lantus and NovoLog.  Denies any blood in the vomitus.  Denies any diarrhea.  Has been feeling generally weak but denies any chest pain or shortness of breath fever or chills.  ED Course: In the ER patient's blood work showed anion gap of 18 with bicarb of 18 blood sugar was 299.  Patient was started on insulin infusion and was given fluid boluses for early DKA.  Metabolic panel sodium improved with fluids and insulin by the time I examined.  Patient still feels weak and nauseated but abdomen appears benign.  LFTs are normal.  Review of Systems: As per HPI, rest all negative.   Past Medical History:  Diagnosis Date  . ARF (acute renal failure) ([place]) 2011   pt was on dialysis "for 1 day approx 2011, pt is no longer on dialysis (03/09/2017)  . DKA (diabetic ketoacidoses) ([place])    "several times" (03/09/2017)  . Type I diabetes mellitus ([place]) Dx 2012    Past Surgical History:  Procedure Laterality Date  . NO PAST SURGERIES       reports that he quit smoking about 4 years ago. His smoking use included cigarettes. He has a 2.50 pack-year smoking history. He has never used smokeless tobacco. He reports that he drinks alcohol. He reports that he does not use drugs.  No Known Allergies  Family History  Problem Relation Age of Onset  . Diabetes Father   . Diabetes Maternal Grandmother   . Heart disease Neg Hx   . Hypertension Neg Hx     Prior to Admission medications   Medication Sig Start Date End Date  Taking? Authorizing Provider  insulin aspart (NOVOLOG) 100 UNIT/ML injection Inject 0-12 Units into the skin 3 (three) times daily before meals. Per sliding scale 09/28/17  Yes Charlott Rakes, MD  Insulin Glargine (LANTUS) 100 UNIT/ML Solostar Pen Inject subcutaneously twice daily 15 units in the evening 10 units in the morning 09/28/17  Yes Newlin, Enobong, MD  Blood Glucose Monitoring Suppl (TRUE METRIX METER) w/Device KIT 1 each by Does not apply route as needed. 07/12/16   Florencia Reasons, MD    Physical Exam: Vitals:   11/29/17 2145 11/29/17 2230 11/29/17 2315 11/30/17 0000  BP: 120/86 (!) 118/93 133/80 126/90  Pulse: 81 (!) 103 81 83  Resp: '15 17 17 16  '$ Temp:      TempSrc:      SpO2: 98% 99% 97% 100%  Height:          Constitutional: Moderately built and nourished. Vitals:   11/29/17 2145 11/29/17 2230 11/29/17 2315 11/30/17 0000  BP: 120/86 (!) 118/93 133/80 126/90  Pulse: 81 (!) 103 81 83  Resp: '15 17 17 16  '$ Temp:      TempSrc:      SpO2: 98% 99% 97% 100%  Height:       Eyes: Anicteric no pallor. ENMT: No discharge from the ears eyes nose or mouth. Neck: No mass felt.  No neck rigidity.  No JVD appreciated. Respiratory: No  rhonchi or crepitations. Cardiovascular: S1-S2 heard no murmurs appreciated. Abdomen: Soft nontender bowel sounds present. Musculoskeletal: No edema.  No joint effusion. Skin: No rash.  Skin appears warm. Neurologic: Alert awake oriented to time place and person.  Moves all extremities. Psychiatric: Appears normal.  Normal affect.   Labs on Admission: I have personally reviewed following labs and imaging studies  CBC: Recent Labs  Lab 11/29/17 1821  WBC 8.9  HGB 15.1  HCT 43.9  MCV 87.1  PLT 545   Basic Metabolic Panel: Recent Labs  Lab 11/29/17 1821 11/29/17 2234  NA 136 140  K 4.2 3.6  CL 100 111  CO2 18* 22  GLUCOSE 299* 176*  BUN 23* 19  CREATININE 0.81 0.57*  CALCIUM 9.1 7.9*   GFR: CrCl cannot be calculated (Unknown ideal  weight.). Liver Function Tests: Recent Labs  Lab 11/29/17 1821  AST 22  ALT 28  ALKPHOS 68  BILITOT 1.4*  PROT 8.0  ALBUMIN 4.5   Recent Labs  Lab 11/29/17 1821  LIPASE 38   No results for input(s): AMMONIA in the last 168 hours. Coagulation Profile: No results for input(s): INR, PROTIME in the last 168 hours. Cardiac Enzymes: No results for input(s): CKTOTAL, CKMB, CKMBINDEX, TROPONINI in the last 168 hours. BNP (last 3 results) No results for input(s): PROBNP in the last 8760 hours. HbA1C: No results for input(s): HGBA1C in the last 72 hours. CBG: Recent Labs  Lab 11/29/17 2024 11/29/17 2129 11/29/17 2232 11/29/17 2342 11/30/17 0048  GLUCAP 246* 189* 166* 168* 168*   Lipid Profile: No results for input(s): CHOL, HDL, LDLCALC, TRIG, CHOLHDL, LDLDIRECT in the last 72 hours. Thyroid Function Tests: No results for input(s): TSH, T4TOTAL, FREET4, T3FREE, THYROIDAB in the last 72 hours. Anemia Panel: No results for input(s): VITAMINB12, FOLATE, FERRITIN, TIBC, IRON, RETICCTPCT in the last 72 hours. Urine analysis:    Component Value Date/Time   COLORURINE STRAW (A) 09/20/2017 0743   APPEARANCEUR CLEAR 09/20/2017 0743   LABSPEC 1.022 09/20/2017 0743   PHURINE 5.0 09/20/2017 0743   GLUCOSEU >=500 (A) 09/20/2017 0743   HGBUR NEGATIVE 09/20/2017 0743   BILIRUBINUR NEGATIVE 09/20/2017 0743   BILIRUBINUR Negative 02/07/2016 1528   KETONESUR 80 (A) 09/20/2017 0743   PROTEINUR NEGATIVE 09/20/2017 0743   UROBILINOGEN 0.2 02/07/2016 1528   UROBILINOGEN 0.2 03/22/2015 0913   NITRITE NEGATIVE 09/20/2017 0743   LEUKOCYTESUR NEGATIVE 09/20/2017 0743   Sepsis Labs: '@LABRCNTIP'$ (procalcitonin:4,lacticidven:4) )No results found for this or any previous visit (from the past 240 hour(s)).   Radiological Exams on Admission: No results found.    Assessment/Plan Principal Problem:   DKA (diabetic ketoacidoses) (HCC) Active Problems:   DKA, type 1 (HCC)   Nausea &  vomiting    1. Diabetic ketoacidosis and type 1 diabetes -last hemoglobin A1c in May 2019 was 13.  Has had multiple admissions recently for DKA.  Not sure if patient is compliant with his medications.  Initially patient was placed on insulin infusion and IV fluid boluses.  And repeat metabolic panel time I examined has improved anion gap and I switched patient to Lantus with sliding scale coverage every 4 for now.  Follow metabolic panel.  Continue hydration. 2. Nausea vomiting likely from gastroparesis.  Abdomen appears benign LFTs are normal.  Closely observe.    DVT prophylaxis: Lovenox. Code Status: Full code. Family Communication: Discussed with patient. Disposition Plan: Home. Consults called: None. Admission status: Inpatient.   Rise Patience MD [facility] Pager [phone].  If 7PM-7AM, please contact night-coverage [email or web address] Password [facility]  11/30/2017, 12:52 AM

## 2017-11-30 NOTE — Progress Notes (Signed)
Initial Nutrition Assessment  DOCUMENTATION CODES:   Severe malnutrition in context of chronic illness, Underweight  INTERVENTION:   Glucerna Shake po TID, each supplement provides 220 kcal and 10 grams of protein  NUTRITION DIAGNOSIS:   Severe Malnutrition related to chronic illness(uncontrolled DM) as evidenced by energy intake < or equal to 75% for > or equal to 1 month, mild fat depletion, moderate fat depletion, severe muscle depletion  GOAL:   Patient will meet greater than or equal to 90% of their needs  MONITOR:   PO intake, Supplement acceptance, Labs, Weight trends  REASON FOR ASSESSMENT:   Malnutrition Screening Tool    ASSESSMENT:   Patient with PMH significant for type 1 DM and recurrent admission for DKA. Presents this admission with nausea/vomiting likely from gastroparesis and DKA.    Pt reports he noticed a decrease in appetite over the last month due to feelings of fullness. States he would eat three times per day but the quantity was small. He tried to limit bread and sugar sweetened beverages to help maintain his blood sugar. He reports being compliant with his insulin. RD discussed how to manage gastroparesis and provided a brief DM education. Discussed the importance of protein intake for preservation of lean body mass. Pt used to drink Ensure daily but stopped when he felt like his appetite was okay. He is amenable to supplementation this stay. He ate cereal with milk for breakfast without any complication.   Pt endorses a UBW of 130 lb. Records indicate pt weighed 113 lb 03/11/17 and 105 lb this admission (7.1% wt loss in 9 months, insignificant for time frame). Nutrition-Focused physical exam completed.   Medications reviewed.  Labs reviewed.   NUTRITION - FOCUSED PHYSICAL EXAM:    Most Recent Value  Orbital Region  Mild depletion  Upper Arm Region  Moderate depletion  Thoracic and Lumbar Region  Unable to assess  Buccal Region  Mild depletion   Temple Region  Moderate depletion  Clavicle Bone Region  Severe depletion  Clavicle and Acromion Bone Region  Severe depletion  Scapular Bone Region  Unable to assess  Dorsal Hand  Mild depletion  Patellar Region  Severe depletion  Anterior Thigh Region  Severe depletion  Posterior Calf Region  Severe depletion  Edema (RD Assessment)  None  Hair  Reviewed  Eyes  Reviewed  Mouth  Reviewed  Skin  Reviewed  Nails  Reviewed     Diet Order:   Diet Order           Diet Carb Modified Fluid consistency: Thin; Room service appropriate? Yes  Diet effective now          EDUCATION NEEDS:   Education needs have been addressed  Skin:  Skin Assessment: Reviewed RN Assessment  Last BM:  11/28/17  Height:   Ht Readings from Last 1 Encounters:  11/30/17 5\' 6"  (1.676 m)    Weight:   Wt Readings from Last 1 Encounters:  11/30/17 105 lb 2.6 oz (47.7 kg)    Ideal Body Weight:  64.5 kg  BMI:  Body mass index is 16.97 kg/m.  Estimated Nutritional Needs:   Kcal:  1600-1800 kcal  Protein:  80-90 grams  Fluid:  >1.6 L/day   Vanessa Kickarly Savannah Morford RD, LDN Clinical Nutrition Pager # - [phone]

## 2017-11-30 NOTE — Progress Notes (Signed)
Patient seen and examined at bedside, patient admitted after midnight, please see earlier detailed admission note by Eduard ClosArshad N Kakrakandy, MD. Briefly, patient presented with DKA. Improved. Transitioned to subcutaneous insulin overnight. Watch blood sugar today. Continue SSI   Jacquelin Hawkingalph Vimal Derego, MD [facility] 11/30/2017, 3:05 PM

## 2017-11-30 NOTE — Care Management Note (Signed)
Case Management Note  Patient Details  Name: [name] [name] MRN: [id number] Date of Birth: [birth year]  Subjective/Objective: pt admitted with DKA                   Action/Plan: Plan to discharge home. Pt may purchase insulin from [facility] 70/30   Expected Discharge Date:                  Expected Discharge Plan:  Home/Self Care  In-House Referral:     Discharge planning Services     Post Acute Care Choice:    Choice offered to:     DME Arranged:    DME Agency:     HH Arranged:    HH Agency:     Status of Service:  In process, will continue to follow  If discussed at Long Length of Stay Meetings, dates discussed:    Additional CommentsGeni Bers:  Darnice Comrie, RN 11/30/2017, 3:00 PM

## 2017-11-30 NOTE — ED Notes (Signed)
RN Chloe made aware pt on stabilizer will bring up to the floor when discontinued at [facility]0215

## 2017-11-30 NOTE — Progress Notes (Signed)
Inpatient Diabetes Program Recommendations  AACE/ADA: New Consensus Statement on Inpatient Glycemic Control (2015)  Target Ranges:  Prepandial:   less than 140 mg/dL      Peak postprandial:   less than 180 mg/dL (1-2 hours)      Critically ill patients:  140 - 180 mg/dL   Lab Results  Component Value Date   GLUCAP 113 (H) 11/30/2017   HGBA1C 13.0 (H) 09/20/2017    Review of Glycemic Control  Home meds: Lantus Solostar pen 10 units in am and 15 units QHS, Novolog 0-12 units tidwc.  Inpatient Diabetes Program Recommendations:    Note repeated DKA admissions. Patient has been spoken to several times by DM coordinators. No further recommendations at this time.  Needs outpatient f/u - [facility] rather than come to hospital for DM management and refills.  Will follow.  Thank you. [name] [name], RD, LDN, CDE Inpatient Diabetes Coordinator [phone]

## 2017-11-30 NOTE — ED Notes (Signed)
Glucose stabilizer stopped per protocol, spoke with Chloe RN to make aware of pt coming upstairs

## 2017-12-01 DIAGNOSIS — E1010 Type 1 diabetes mellitus with ketoacidosis without coma: Principal | ICD-10-CM

## 2017-12-01 LAB — BASIC METABOLIC PANEL
Anion gap: 7 (ref 5–15)
BUN: 10 mg/dL (ref 6–20)
CALCIUM: 8.5 mg/dL — AB (ref 8.9–10.3)
CO2: 28 mmol/L (ref 22–32)
Chloride: 104 mmol/L (ref 98–111)
Creatinine, Ser: 0.38 mg/dL — ABNORMAL LOW (ref 0.61–1.24)
GFR calc Af Amer: >60 mL/min (ref >60)
Glucose, Bld: 105 mg/dL — ABNORMAL HIGH (ref 70–99)
POTASSIUM: 3.1 mmol/L — AB (ref 3.5–5.1)
SODIUM: 139 mmol/L (ref 135–145)

## 2017-12-01 LAB — GLUCOSE, CAPILLARY
GLUCOSE-CAPILLARY: 147 mg/dL — AB (ref 70–99)
Glucose-Capillary: 128 mg/dL — ABNORMAL HIGH (ref 70–99)

## 2017-12-01 MED ORDER — POTASSIUM CHLORIDE CRYS ER 20 MEQ PO TBCR
40.0000 meq | EXTENDED_RELEASE_TABLET | Freq: Once | ORAL | Status: AC
  Administered 2017-12-01: 40 meq via ORAL
  Filled 2017-12-01: qty 2

## 2017-12-01 NOTE — Discharge Instructions (Signed)
[name] [name],  You are admitted for DKA.  You are given IV insulin and quickly transitioned to subcutaneous insulin (Lantus) with sliding scale.  There have been no adjustments to your insulin regimen since this is likely secondary to your recent illness.  Please follow-up with your primary care physician for continued management of your insulin.

## 2017-12-01 NOTE — Discharge Summary (Signed)
Physician Discharge Summary  [name] [name] RFF:[id number] DOB: [birth year] DOA: 11/29/2017  PCP: Charlott Rakes, MD  Admit date: 11/29/2017 Discharge date: 12/01/2017  Admitted From: Home Disposition: Home  Recommendations for Outpatient Follow-up:  1. Follow up with PCP in 1 week 2. Please obtain BMP/CBC in one week 3. Please follow up on the following pending results: None  Home Health: None Equipment/Devices: None  Discharge Condition: Stable CODE STATUS: Full code Diet recommendation: Carb modified   Brief/Interim Summary:  Admission HPI written by Rise Patience, MD   Chief Complaint: Nausea vomiting elevated blood sugar and weakness.  HPI: [name] [name] is a 32 y.o. male with diabetes mellitus type 1 presents to the ER with complaints of having persistent nausea vomiting over the last 2 days with generalized weakness and elevated blood sugar.  Patient states he has been compliant with his home medications of Lantus and NovoLog.  Denies any blood in the vomitus.  Denies any diarrhea.  Has been feeling generally weak but denies any chest pain or shortness of breath fever or chills.  ED Course: In the ER patient's blood work showed anion gap of 18 with bicarb of 18 blood sugar was 299.  Patient was started on insulin infusion and was given fluid boluses for early DKA.  Metabolic panel sodium improved with fluids and insulin by the time I examined.  Patient still feels weak and nauseated but abdomen appears benign.  LFTs are normal.   Hospital course:  Diabetic ketoacidosis Diabetes mellitus, type I, uncontrolled Patient was started on IV insulin and his gap quickly closed.  He is transition to subcutaneous insulin at a reduced dose with good control.  On discussion with patient, patient states that he eats differently while outpatient and plans to return to his former diet.  Will recommend resumption of his prior regimen.  DKA appears to have been precipitated by  acute illness which is now resolved.  May benefit from evaluation for gastroparesis as an outpatient.  Discharge Diagnoses:  Principal Problem:   DKA (diabetic ketoacidoses) ([place]) Active Problems:   DKA, type 1 (HCC)   Nausea & vomiting    Discharge Instructions  Discharge Instructions    Call MD for:  difficulty breathing, headache or visual disturbances   Complete by:  As directed    Call MD for:  extreme fatigue   Complete by:  As directed    Call MD for:  persistant nausea and vomiting   Complete by:  As directed    Increase activity slowly   Complete by:  As directed      Allergies as of 12/01/2017   No Known Allergies     Medication List    TAKE these medications   insulin aspart 100 UNIT/ML injection Commonly known as:  novoLOG Inject 0-12 Units into the skin 3 (three) times daily before meals. Per sliding scale   Insulin Glargine 100 UNIT/ML Solostar Pen Commonly known as:  LANTUS Inject subcutaneously twice daily 15 units in the evening 10 units in the morning   TRUE METRIX METER w/Device Kit 1 each by Does not apply route as needed.       No Known Allergies  Consultations:  None   Procedures/Studies:  No results found.   Subjective: No issues overnight  Discharge Exam: Vitals:   11/30/17 2129 12/01/17 0715  BP: 128/85 121/86  Pulse: 79 81  Resp: 12 12  Temp: 98.9 F (37.2 C) 98.7 F (37.1 C)  SpO2: 97% 100%  Vitals:   11/30/17 0443 11/30/17 1245 11/30/17 2129 12/01/17 0715  BP: (!) 142/96 (!) 149/104 128/85 121/86  Pulse: 82 83 79 81  Resp: _0 Temp: 98.3 F (36.8 C) 98.6 F (37 C) 98.9 F (37.2 C) 98.7 F (37.1 C)  TempSrc: Oral Oral Oral Oral  SpO2: 100% 100% 97% 100%  Weight:      Height:        General: Pt is alert, awake, not in acute distress Cardiovascular: RRR, S1/S2 +, no rubs, no gallops Respiratory: CTA bilaterally, no wheezing, no rhonchi Abdominal: Soft, NT, ND, bowel sounds + Extremities: no  edema, no cyanosis    The results of significant diagnostics from this hospitalization (including imaging, microbiology, ancillary and laboratory) are listed below for reference.     Microbiology: No results found for this or any previous visit (from the past 240 hour(s)).   Labs: BNP (last 3 results) No results for input(s): BNP in the last 8760 hours. Basic Metabolic Panel: Recent Labs  Lab 11/29/17 1821 11/29/17 2234 11/30/17 0211 11/30/17 0429 11/30/17 1147 12/01/17 0341  NA 136 140  --  138 134* 139  K 4.2 3.6  --  3.5 3.9 3.1*  CL 100 111  --  107 102 104  CO2 18* 22  --  18* 19* 28  GLUCOSE 299* 176*  --  130* 181* 105*  BUN 23* 19  --  _1 CREATININE 0.81 0.57* 0.57* 0.54* 0.64 0.38*  CALCIUM 9.1 7.9*  --  8.3* 8.0* 8.5*   Liver Function Tests: Recent Labs  Lab 11/29/17 1821  AST 22  ALT 28  ALKPHOS 68  BILITOT 1.4*  PROT 8.0  ALBUMIN 4.5   Recent Labs  Lab 11/29/17 1821  LIPASE 38   No results for input(s): AMMONIA in the last 168 hours. CBC: Recent Labs  Lab 11/29/17 1821 11/30/17 0211 11/30/17 0429  WBC 8.9 7.7 7.4  HGB 15.1 12.9* 14.0  HCT 43.9 36.6* 40.0  MCV 87.1 86.1 87.0  PLT 278 216 216   Cardiac Enzymes: Recent Labs  Lab 11/30/17 0622  CKTOTAL 60  TROPONINI <0.03   BNP: Invalid input(s): POCBNP CBG: Recent Labs  Lab 11/30/17 1158 11/30/17 1730 11/30/17 2128 12/01/17 0004 12/01/17 0403  GLUCAP 113* 132* 166* 147* 128*   D-Dimer No results for input(s): DDIMER in the last 72 hours. Hgb A1c No results for input(s): HGBA1C in the last 72 hours. Lipid Profile No results for input(s): CHOL, HDL, LDLCALC, TRIG, CHOLHDL, LDLDIRECT in the last 72 hours. Thyroid function studies No results for input(s): TSH, T4TOTAL, T3FREE, THYROIDAB in the last 72 hours.  Invalid input(s): FREET3 Anemia work up No results for input(s): VITAMINB12, FOLATE, FERRITIN, TIBC, IRON, RETICCTPCT in the last 72 hours. Urinalysis      Component Value Date/Time   COLORURINE YELLOW 11/30/2017 0313   APPEARANCEUR CLEAR 11/30/2017 0313   LABSPEC 1.028 11/30/2017 0313   PHURINE 6.0 11/30/2017 0313   GLUCOSEU >=500 (A) 11/30/2017 0313   HGBUR NEGATIVE 11/30/2017 0313   BILIRUBINUR NEGATIVE 11/30/2017 0313   BILIRUBINUR Negative 02/07/2016 1528   KETONESUR 80 (A) 11/30/2017 0313   PROTEINUR 30 (A) 11/30/2017 0313   UROBILINOGEN 0.2 02/07/2016 1528   UROBILINOGEN 0.2 03/22/2015 0913   NITRITE NEGATIVE 11/30/2017 0313   LEUKOCYTESUR NEGATIVE 11/30/2017 0313     SIGNED:   Cordelia Poche, MD [facility] 12/01/2017, 9:15 AM

## 2017-12-01 NOTE — Plan of Care (Signed)
Discharge instructions reviewed with patient, questions answered, verbalized understanding.  No medication changes at this time, no scripts required.  Patient ambulatory accompanied by RN to ED entrance for mother to pick him up.  All belongings in patients possession at time of discharge.

## 2017-12-03 ENCOUNTER — Encounter: Payer: Self-pay | Admitting: Family Medicine

## 2017-12-03 ENCOUNTER — Ambulatory Visit: Payer: Self-pay | Attending: Family Medicine | Admitting: Family Medicine

## 2017-12-03 VITALS — BP 109/73 | HR 103 | Temp 98.2°F | Resp 18 | Ht 66.0 in | Wt 121.0 lb

## 2017-12-03 DIAGNOSIS — E1069 Type 1 diabetes mellitus with other specified complication: Secondary | ICD-10-CM | POA: Insufficient documentation

## 2017-12-03 DIAGNOSIS — E1010 Type 1 diabetes mellitus with ketoacidosis without coma: Secondary | ICD-10-CM

## 2017-12-03 DIAGNOSIS — E876 Hypokalemia: Secondary | ICD-10-CM | POA: Insufficient documentation

## 2017-12-03 DIAGNOSIS — Z794 Long term (current) use of insulin: Secondary | ICD-10-CM | POA: Insufficient documentation

## 2017-12-03 LAB — GLUCOSE, CAPILLARY: GLUCOSE-CAPILLARY: 159 mg/dL — AB (ref 70–99)

## 2017-12-03 LAB — GLUCOSE, POCT (MANUAL RESULT ENTRY): POC Glucose: 109 mg/dl — AB (ref 70–99)

## 2017-12-03 MED ORDER — INSULIN GLARGINE 100 UNIT/ML SOLOSTAR PEN: PEN_INJECTOR | SUBCUTANEOUS | 3 refills | Status: DC

## 2017-12-03 MED ORDER — INSULIN ASPART 100 UNIT/ML ~~LOC~~ SOLN: 0.0000 [IU] | Freq: Three times a day (TID) | SUBCUTANEOUS | 3 refills | Status: DC

## 2017-12-03 NOTE — Patient Instructions (Signed)
Diabetes Mellitus and Nutrition When you have diabetes (diabetes mellitus), it is very important to have healthy eating habits because your blood sugar (glucose) levels are greatly affected by what you eat and drink. Eating healthy foods in the appropriate amounts, at about the same times every day, can help you:  Control your blood glucose.  Lower your risk of heart disease.  Improve your blood pressure.  Reach or maintain a healthy weight.  Every person with diabetes is different, and each person has different needs for a meal plan. Your health care provider may recommend that you work with a diet and nutrition specialist (dietitian) to make a meal plan that is best for you. Your meal plan may vary depending on factors such as:  The calories you need.  The medicines you take.  Your weight.  Your blood glucose, blood pressure, and cholesterol levels.  Your activity level.  Other health conditions you have, such as heart or kidney disease.  How do carbohydrates affect me? Carbohydrates affect your blood glucose level more than any other type of food. Eating carbohydrates naturally increases the amount of glucose in your blood. Carbohydrate counting is a method for keeping track of how many carbohydrates you eat. Counting carbohydrates is important to keep your blood glucose at a healthy level, especially if you use insulin or take certain oral diabetes medicines. It is important to know how many carbohydrates you can safely have in each meal. This is different for every person. Your dietitian can help you calculate how many carbohydrates you should have at each meal and for snack. Foods that contain carbohydrates include:  Bread, cereal, rice, pasta, and crackers.  Potatoes and corn.  Peas, beans, and lentils.  Milk and yogurt.  Fruit and juice.  Desserts, such as cakes, cookies, ice cream, and candy.  How does alcohol affect me? Alcohol can cause a sudden decrease in blood  glucose (hypoglycemia), especially if you use insulin or take certain oral diabetes medicines. Hypoglycemia can be a life-threatening condition. Symptoms of hypoglycemia (sleepiness, dizziness, and confusion) are similar to symptoms of having too much alcohol. If your health care provider says that alcohol is safe for you, follow these guidelines:  Limit alcohol intake to no more than 1 drink per day for nonpregnant women and 2 drinks per day for men. One drink equals 12 oz of beer, 5 oz of wine, or 1 oz of hard liquor.  Do not drink on an empty stomach.  Keep yourself hydrated with water, diet soda, or unsweetened iced tea.  Keep in mind that regular soda, juice, and other mixers may contain a lot of sugar and must be counted as carbohydrates.  What are tips for following this plan? Reading food labels  Start by checking the serving size on the label. The amount of calories, carbohydrates, fats, and other nutrients listed on the label are based on one serving of the food. Many foods contain more than one serving per package.  Check the total grams (g) of carbohydrates in one serving. You can calculate the number of servings of carbohydrates in one serving by dividing the total carbohydrates by 15. For example, if a food has 30 g of total carbohydrates, it would be equal to 2 servings of carbohydrates.  Check the number of grams (g) of saturated and trans fats in one serving. Choose foods that have low or no amount of these fats.  Check the number of milligrams (mg) of sodium in one serving. Most people   should limit total sodium intake to less than 2,300 mg per day.  Always check the nutrition information of foods labeled as "low-fat" or "nonfat". These foods may be higher in added sugar or refined carbohydrates and should be avoided.  Talk to your dietitian to identify your daily goals for nutrients listed on the label. Shopping  Avoid buying canned, premade, or processed foods. These  foods tend to be high in fat, sodium, and added sugar.  Shop around the outside edge of the grocery store. This includes fresh fruits and vegetables, bulk grains, fresh meats, and fresh dairy. Cooking  Use low-heat cooking methods, such as baking, instead of high-heat cooking methods like deep frying.  Cook using healthy oils, such as olive, canola, or sunflower oil.  Avoid cooking with butter, cream, or high-fat meats. Meal planning  Eat meals and snacks regularly, preferably at the same times every day. Avoid going long periods of time without eating.  Eat foods high in fiber, such as fresh fruits, vegetables, beans, and whole grains. Talk to your dietitian about how many servings of carbohydrates you can eat at each meal.  Eat 4-6 ounces of lean protein each day, such as lean meat, chicken, fish, eggs, or tofu. 1 ounce is equal to 1 ounce of meat, chicken, or fish, 1 egg, or 1/4 cup of tofu.  Eat some foods each day that contain healthy fats, such as avocado, nuts, seeds, and fish. Lifestyle   Check your blood glucose regularly.  Exercise at least 30 minutes 5 or more days each week, or as told by your health care provider.  Take medicines as told by your health care provider.  Do not use any products that contain nicotine or tobacco, such as cigarettes and e-cigarettes. If you need help quitting, ask your health care provider.  Work with a counselor or diabetes educator to identify strategies to manage stress and any emotional and social challenges. What are some questions to ask my health care provider?  Do I need to meet with a diabetes educator?  Do I need to meet with a dietitian?  What number can I call if I have questions?  When are the best times to check my blood glucose? Where to find more information:  American Diabetes Association: diabetes.org/food-and-fitness/food  Academy of Nutrition and Dietetics:  www.eatright.org/resources/health/diseases-and-conditions/diabetes  National Institute of Diabetes and Digestive and Kidney Diseases (NIH): www.niddk.nih.gov/health-information/diabetes/overview/diet-eating-physical-activity Summary  A healthy meal plan will help you control your blood glucose and maintain a healthy lifestyle.  Working with a diet and nutrition specialist (dietitian) can help you make a meal plan that is best for you.  Keep in mind that carbohydrates and alcohol have immediate effects on your blood glucose levels. It is important to count carbohydrates and to use alcohol carefully. This information is not intended to replace advice given to you by your health care provider. Make sure you discuss any questions you have with your health care provider. Document Released: 02/02/2005 Document Revised: 06/12/2016 Document Reviewed: 06/12/2016 Elsevier Interactive Patient Education  2018 Elsevier Inc.  

## 2017-12-03 NOTE — Progress Notes (Signed)
Subjective:  Patient ID: [name] [name], male    DOB: 04/21/1986  Age: 32 y.o. MRN: [id number]  CC: Follow-up   HPI [name] [name] is a 32 year old male with a history of type 1 diabetes mellitus (A1c 13.0 from 09/2017) who presents today for follow-up visit.  He was hospitalized from 11/29/2017 through 12/01/2017 for diabetic ketoacidosis. He had presented with nausea and vomiting and found to have blood sugar of 299 with an anion gap of 18 and bicarb of 18 which was treated with IV fluids and IV insulin with improvement in his symptoms after which he was transitioned to subcutaneous insulin. Reports his random sugars have ranged between 160 and 240 and he denies hypoglycemia, blurry vision or paresthesias in his extremities. He has no additional concerns today GI symptoms have since resolved.  Past Medical History:  Diagnosis Date  . ARF (acute renal failure) ([place]) 2011   pt was on dialysis "for 1 day approx 2011, pt is no longer on dialysis (03/09/2017)  . DKA (diabetic ketoacidoses) ([place])    "several times" (03/09/2017)  . Type I diabetes mellitus ([place]) Dx 2012    Past Surgical History:  Procedure Laterality Date  . NO PAST SURGERIES      No Known Allergies    Outpatient Medications Prior to Visit  Medication Sig Dispense Refill  . Blood Glucose Monitoring Suppl (TRUE METRIX METER) w/Device KIT 1 each by Does not apply route as needed. 1 kit 0  . insulin aspart (NOVOLOG) 100 UNIT/ML injection Inject 0-12 Units into the skin 3 (three) times daily before meals. Per sliding scale 30 mL 3  . Insulin Glargine (LANTUS) 100 UNIT/ML Solostar Pen Inject subcutaneously twice daily 15 units in the evening 10 units in the morning 15 mL 3   No facility-administered medications prior to visit.     ROS Review of Systems  Constitutional: Negative for activity change and appetite change.  HENT: Negative for sinus pressure and sore throat.   Eyes: Negative for visual disturbance.    Respiratory: Negative for cough, chest tightness and shortness of breath.   Cardiovascular: Negative for chest pain and leg swelling.  Gastrointestinal: Negative for abdominal distention, abdominal pain, constipation and diarrhea.  Endocrine: Negative.   Genitourinary: Negative for dysuria.  Musculoskeletal: Negative for joint swelling and myalgias.  Skin: Negative for rash.  Allergic/Immunologic: Negative.   Neurological: Negative for weakness, light-headedness and numbness.  Psychiatric/Behavioral: Negative for dysphoric mood and suicidal ideas.    Objective:  BP 109/73 (BP Location: Left Arm, Patient Position: Sitting, Cuff Size: Normal)   Pulse (!) 103   Temp 98.2 F (36.8 C) (Oral)   Resp 18   Ht '5\' 6"'$  (1.676 m)   Wt 121 lb (54.9 kg)   SpO2 99%   BMI 19.53 kg/m   BP/Weight 12/03/2017 12/01/2017 4/97/0263  Systolic BP 785 885 -  Diastolic BP 73 86 -  Wt. (Lbs) 121 - 105.16  BMI 19.53 - 16.97      Physical Exam  Constitutional: He is oriented to person, place, and time. He appears well-developed and well-nourished.  Cardiovascular: Normal rate, normal heart sounds and intact distal pulses.  No murmur heard. Pulmonary/Chest: Effort normal and breath sounds normal. He has no wheezes. He has no rales. He exhibits no tenderness.  Abdominal: Soft. Bowel sounds are normal. He exhibits no distension and no mass. There is no tenderness.  Musculoskeletal: Normal range of motion.  Neurological: He is alert and oriented to person, place, and time.  Skin: Skin is warm and dry.  Psychiatric: He has a normal mood and affect.    CMP Latest Ref Rng & Units 12/01/2017 11/30/2017 11/30/2017  Glucose 70 - 99 mg/dL 105(H) 181(H) 130(H)  BUN 6 - 20 mg/dL '10 15 17  '$ Creatinine 0.61 - 1.24 mg/dL 0.38(L) 0.64 0.54(L)  Sodium 135 - 145 mmol/L 139 134(L) 138  Potassium 3.5 - 5.1 mmol/L 3.1(L) 3.9 3.5  Chloride 98 - 111 mmol/L 104 102 107  CO2 22 - 32 mmol/L 28 19(L) 18(L)  Calcium 8.9 -  10.3 mg/dL 8.5(L) 8.0(L) 8.3(L)  Total Protein 6.5 - 8.1 g/dL - - -  Total Bilirubin 0.3 - 1.2 mg/dL - - -  Alkaline Phos 38 - 126 U/L - - -  AST 15 - 41 U/L - - -  ALT 0 - 44 U/L - - -     Lab Results  Component Value Date   HGBA1C 13.0 (H) 09/20/2017    Assessment & Plan:   1. Type 1 diabetes mellitus with other specified complication ([place]) Uncontrolled with A1c of 13.0 Next A1c is due in 12/21/2017 Advised to continue current regimen and his insulin will be adjusted after his A1c if indicated Counseled on Diabetic diet, my plate method, 012 minutes of moderate intensity exercise/week Keep blood sugar logs with fasting goals of 80-120 mg/dl, random of less than 180 and in the event of sugars less than 60 mg/dl or greater than 400 mg/dl please notify the clinic ASAP. It is recommended that you undergo annual eye exams and annual foot exams. Pneumonia vaccine is recommended. - Glucose (CBG) - CMP14+EGFR; Future - Microalbumin/Creatinine Ratio, Urine; Future - Hemoglobin A1c; Future - Insulin Glargine (LANTUS) 100 UNIT/ML Solostar Pen; Inject subcutaneously twice daily 15 units in the evening 10 units in the morning  Dispense: 15 mL; Refill: 3 - insulin aspart (NOVOLOG) 100 UNIT/ML injection; Inject 0-12 Units into the skin 3 (three) times daily before meals. Per sliding scale  Dispense: 30 mL; Refill: 3  2. Hypokalemia Last potassium was 3.3 but this could have been secondary to his dehydration We will check potassium level along with future labs.   Meds ordered this encounter  Medications  . Insulin Glargine (LANTUS) 100 UNIT/ML Solostar Pen    Sig: Inject subcutaneously twice daily 15 units in the evening 10 units in the morning    Dispense:  15 mL    Refill:  3  . insulin aspart (NOVOLOG) 100 UNIT/ML injection    Sig: Inject 0-12 Units into the skin 3 (three) times daily before meals. Per sliding scale    Dispense:  30 mL    Refill:  3    Follow-up: Return in about 3  months (around 03/05/2018) for Follow-up of diabetes mellitus.   Charlott Rakes MD

## 2017-12-04 MED FILL — $LANTUS SOLOSTAR 100 UNITS/: 100 | 24 days supply | Qty: 6 | Fill #1

## 2017-12-04 MED FILL — !NOVOLOG 100UNITS/ML VIAL: 100/ML | 27 days supply | Qty: 10 | Fill #1

## 2017-12-20 ENCOUNTER — Other Ambulatory Visit: Payer: Self-pay

## 2017-12-21 ENCOUNTER — Ambulatory Visit: Payer: Self-pay | Attending: Family Medicine

## 2017-12-21 DIAGNOSIS — E1069 Type 1 diabetes mellitus with other specified complication: Secondary | ICD-10-CM | POA: Insufficient documentation

## 2017-12-21 NOTE — Progress Notes (Signed)
Patient here for lab visit only 

## 2017-12-22 LAB — CMP14+EGFR
ALT: 18 IU/L (ref 0–44)
AST: 23 IU/L (ref 0–40)
Albumin/Globulin Ratio: 1.8 (ref 1.2–2.2)
Albumin: 4.4 g/dL (ref 3.5–5.5)
Alkaline Phosphatase: 75 IU/L (ref 39–117)
BILIRUBIN TOTAL: 0.3 mg/dL (ref 0.0–1.2)
BUN / CREAT RATIO: 11 (ref 9–20)
BUN: 10 mg/dL (ref 6–20)
CHLORIDE: 97 mmol/L (ref 96–106)
CO2: 16 mmol/L — ABNORMAL LOW (ref 20–29)
Calcium: 9 mg/dL (ref 8.7–10.2)
Creatinine, Ser: 0.92 mg/dL (ref 0.76–1.27)
GFR calc non Af Amer: 108 mL/min/{1.73_m2} (ref >59)
GFR, EST AFRICAN AMERICAN: 125 mL/min/{1.73_m2} (ref >59)
GLOBULIN, TOTAL: 2.5 g/dL (ref 1.5–4.5)
Glucose: 268 mg/dL — ABNORMAL HIGH (ref 65–99)
Potassium: 4.2 mmol/L (ref 3.5–5.2)
SODIUM: 133 mmol/L — AB (ref 134–144)
TOTAL PROTEIN: 6.9 g/dL (ref 6.0–8.5)

## 2017-12-22 LAB — MICROALBUMIN / CREATININE URINE RATIO
CREATININE, UR: 135.4 mg/dL
Microalb/Creat Ratio: 25 mg/g creat (ref 0.0–30.0)
Microalbumin, Urine: 33.9 ug/mL

## 2017-12-22 LAB — HEMOGLOBIN A1C
ESTIMATED AVERAGE GLUCOSE: 326 mg/dL
HEMOGLOBIN A1C: 13 % — AB (ref 4.8–5.6)

## 2017-12-24 ENCOUNTER — Telehealth: Payer: Self-pay

## 2017-12-24 ENCOUNTER — Other Ambulatory Visit: Payer: Self-pay | Admitting: Family Medicine

## 2017-12-24 DIAGNOSIS — E1069 Type 1 diabetes mellitus with other specified complication: Secondary | ICD-10-CM

## 2017-12-24 MED ORDER — INSULIN GLARGINE 100 UNIT/ML SOLOSTAR PEN: PEN_INJECTOR | SUBCUTANEOUS | 3 refills | Status: DC

## 2017-12-24 NOTE — Telephone Encounter (Signed)
-----   Message from Hoy RegisterEnobong Newlin, MD sent at 12/24/2017  8:43 AM EDT ----- I have changed his insulin regimen to 15 units of Lantus in the morning and 20 units in the evening due to elevated A1c of 13.0.  Please encouraged to adhere to a diabetic diet

## 2017-12-24 NOTE — Telephone Encounter (Signed)
Patient was called and informed of lab results and medication change. 

## 2018-01-02 ENCOUNTER — Encounter (HOSPITAL_COMMUNITY): Payer: Self-pay | Admitting: Emergency Medicine

## 2018-01-02 ENCOUNTER — Inpatient Hospital Stay (HOSPITAL_COMMUNITY)
Admission: EM | Admit: 2018-01-02 | Discharge: 2018-01-03 | DRG: 637 | Disposition: A | Discharge status: Home / Self Care | Payer: Self-pay | Attending: Oncology | Admitting: Oncology

## 2018-01-02 ENCOUNTER — Other Ambulatory Visit: Payer: Self-pay

## 2018-01-02 DIAGNOSIS — Z794 Long term (current) use of insulin: Secondary | ICD-10-CM

## 2018-01-02 DIAGNOSIS — E1010 Type 1 diabetes mellitus with ketoacidosis without coma: Principal | ICD-10-CM | POA: Diagnosis present

## 2018-01-02 DIAGNOSIS — Z833 Family history of diabetes mellitus: Secondary | ICD-10-CM

## 2018-01-02 DIAGNOSIS — E876 Hypokalemia: Secondary | ICD-10-CM | POA: Diagnosis present

## 2018-01-02 DIAGNOSIS — E1069 Type 1 diabetes mellitus with other specified complication: Secondary | ICD-10-CM

## 2018-01-02 DIAGNOSIS — Z681 Body mass index (BMI) 19 or less, adult: Secondary | ICD-10-CM

## 2018-01-02 DIAGNOSIS — Z720 Tobacco use: Secondary | ICD-10-CM

## 2018-01-02 DIAGNOSIS — Z87891 Personal history of nicotine dependence: Secondary | ICD-10-CM

## 2018-01-02 DIAGNOSIS — E860 Dehydration: Secondary | ICD-10-CM | POA: Diagnosis present

## 2018-01-02 DIAGNOSIS — E43 Unspecified severe protein-calorie malnutrition: Secondary | ICD-10-CM | POA: Diagnosis present

## 2018-01-02 LAB — MAGNESIUM: Magnesium: 2 mg/dL (ref 1.7–2.4)

## 2018-01-02 LAB — BASIC METABOLIC PANEL
ANION GAP: 6 (ref 5–15)
ANION GAP: 7 (ref 5–15)
ANION GAP: 6 (ref 5–15)
BUN: 20 mg/dL (ref 6–20)
BUN: 17 mg/dL (ref 6–20)
BUN: 14 mg/dL (ref 6–20)
CALCIUM: 7.4 mg/dL — AB (ref 8.9–10.3)
CALCIUM: 7.9 mg/dL — AB (ref 8.9–10.3)
CALCIUM: 7.7 mg/dL — AB (ref 8.9–10.3)
CO2: 19 mmol/L — ABNORMAL LOW (ref 22–32)
CO2: 20 mmol/L — ABNORMAL LOW (ref 22–32)
CO2: 19 mmol/L — AB (ref 22–32)
CREATININE: 0.56 mg/dL — AB (ref 0.61–1.24)
Chloride: 111 mmol/L (ref 98–111)
Chloride: 111 mmol/L (ref 98–111)
Chloride: 109 mmol/L (ref 98–111)
Creatinine, Ser: 0.82 mg/dL (ref 0.61–1.24)
Creatinine, Ser: 0.6 mg/dL — ABNORMAL LOW (ref 0.61–1.24)
GFR calc Af Amer: >60 mL/min (ref >60)
GFR calc Af Amer: >60 mL/min (ref >60)
GFR calc non Af Amer: >60 mL/min (ref >60)
GLUCOSE: 260 mg/dL — AB (ref 70–99)
GLUCOSE: 139 mg/dL — AB (ref 70–99)
Glucose, Bld: 143 mg/dL — ABNORMAL HIGH (ref 70–99)
Potassium: 3.4 mmol/L — ABNORMAL LOW (ref 3.5–5.1)
Potassium: 3.2 mmol/L — ABNORMAL LOW (ref 3.5–5.1)
Potassium: 4.1 mmol/L (ref 3.5–5.1)
Sodium: 136 mmol/L (ref 135–145)
Sodium: 138 mmol/L (ref 135–145)
Sodium: 134 mmol/L — ABNORMAL LOW (ref 135–145)

## 2018-01-02 LAB — MRSA PCR SCREENING: MRSA by PCR: NEGATIVE

## 2018-01-02 LAB — GLUCOSE, CAPILLARY
GLUCOSE-CAPILLARY: 198 mg/dL — AB (ref 70–99)
GLUCOSE-CAPILLARY: 195 mg/dL — AB (ref 70–99)
GLUCOSE-CAPILLARY: 145 mg/dL — AB (ref 70–99)
GLUCOSE-CAPILLARY: 117 mg/dL — AB (ref 70–99)
GLUCOSE-CAPILLARY: 99 mg/dL (ref 70–99)
GLUCOSE-CAPILLARY: 159 mg/dL — AB (ref 70–99)
Glucose-Capillary: 97 mg/dL (ref 70–99)

## 2018-01-02 LAB — URINALYSIS, ROUTINE W REFLEX MICROSCOPIC
Bacteria, UA: NONE SEEN
Bilirubin Urine: NEGATIVE
Glucose, UA: >=500 mg/dL — AB
HGB URINE DIPSTICK: NEGATIVE
KETONES UR: 80 mg/dL — AB
LEUKOCYTES UA: NEGATIVE
Nitrite: NEGATIVE
Protein, ur: NEGATIVE mg/dL
Specific Gravity, Urine: 1.026 (ref 1.005–1.030)
pH: 6 (ref 5.0–8.0)

## 2018-01-02 LAB — I-STAT VENOUS BLOOD GAS, ED
Acid-base deficit: 9 mmol/L — ABNORMAL HIGH (ref 0.0–2.0)
BICARBONATE: 15.6 mmol/L — AB (ref 20.0–28.0)
O2 Saturation: 88 %
PO2 VEN: 60 mmHg — AB (ref 32.0–45.0)
TCO2: 17 mmol/L — ABNORMAL LOW (ref 22–32)
pCO2, Ven: 31.9 mmHg — ABNORMAL LOW (ref 44.0–60.0)
pH, Ven: 7.299 (ref 7.250–7.430)

## 2018-01-02 LAB — COMPREHENSIVE METABOLIC PANEL
ALK PHOS: 55 U/L (ref 38–126)
ALT: 16 U/L (ref 0–44)
AST: 19 U/L (ref 15–41)
Albumin: 3.6 g/dL (ref 3.5–5.0)
Anion gap: 17 — ABNORMAL HIGH (ref 5–15)
BILIRUBIN TOTAL: 1.6 mg/dL — AB (ref 0.3–1.2)
BUN: 21 mg/dL — ABNORMAL HIGH (ref 6–20)
CALCIUM: 9.1 mg/dL (ref 8.9–10.3)
CO2: 16 mmol/L — ABNORMAL LOW (ref 22–32)
Chloride: 100 mmol/L (ref 98–111)
Creatinine, Ser: 0.91 mg/dL (ref 0.61–1.24)
GFR calc Af Amer: >60 mL/min (ref >60)
Glucose, Bld: 383 mg/dL — ABNORMAL HIGH (ref 70–99)
Potassium: 3.4 mmol/L — ABNORMAL LOW (ref 3.5–5.1)
Sodium: 133 mmol/L — ABNORMAL LOW (ref 135–145)
TOTAL PROTEIN: 6.6 g/dL (ref 6.5–8.1)

## 2018-01-02 LAB — CBC
HCT: 43.5 % (ref 39.0–52.0)
HEMOGLOBIN: 14.1 g/dL (ref 13.0–17.0)
MCH: 29.5 pg (ref 26.0–34.0)
MCHC: 32.4 g/dL (ref 30.0–36.0)
MCV: 91 fL (ref 78.0–100.0)
Platelets: 268 10*3/uL (ref 150–400)
RBC: 4.78 MIL/uL (ref 4.22–5.81)
RDW: 12.9 % (ref 11.5–15.5)
WBC: 4.6 10*3/uL (ref 4.0–10.5)

## 2018-01-02 LAB — CBG MONITORING, ED
GLUCOSE-CAPILLARY: 313 mg/dL — AB (ref 70–99)
Glucose-Capillary: 369 mg/dL — ABNORMAL HIGH (ref 70–99)
Glucose-Capillary: 267 mg/dL — ABNORMAL HIGH (ref 70–99)
Glucose-Capillary: 217 mg/dL — ABNORMAL HIGH (ref 70–99)

## 2018-01-02 LAB — BETA-HYDROXYBUTYRIC ACID: BETA-HYDROXYBUTYRIC ACID: 4.97 mmol/L — AB (ref 0.05–0.27)

## 2018-01-02 LAB — LIPASE, BLOOD: Lipase: 23 U/L (ref 11–51)

## 2018-01-02 MED ORDER — DEXTROSE-NACL 5-0.45 % IV SOLN: INTRAVENOUS | Status: DC

## 2018-01-02 MED ORDER — SODIUM CHLORIDE 0.9 % IV SOLN
INTRAVENOUS | Status: DC
  Administered 2018-01-02: 10:00:00 via INTRAVENOUS

## 2018-01-02 MED ORDER — INSULIN ASPART 100 UNIT/ML ~~LOC~~ SOLN
0.0000 [IU] | Freq: Three times a day (TID) | SUBCUTANEOUS | Status: DC
  Administered 2018-01-03: 5 [IU] via SUBCUTANEOUS
  Administered 2018-01-03: 7 [IU] via SUBCUTANEOUS

## 2018-01-02 MED ORDER — ONDANSETRON HCL 4 MG/2ML IJ SOLN
4.0000 mg | Freq: Once | INTRAMUSCULAR | Status: AC
  Administered 2018-01-02: 4 mg via INTRAVENOUS
  Filled 2018-01-02: qty 2

## 2018-01-02 MED ORDER — SODIUM CHLORIDE 0.9 % IV SOLN
INTRAVENOUS | Status: DC
  Administered 2018-01-02: 12:00:00 via INTRAVENOUS

## 2018-01-02 MED ORDER — ACETAMINOPHEN 650 MG RE SUPP: 650.0000 mg | Freq: Four times a day (QID) | RECTAL | Status: DC | PRN

## 2018-01-02 MED ORDER — POTASSIUM CHLORIDE CRYS ER 20 MEQ PO TBCR
40.0000 meq | EXTENDED_RELEASE_TABLET | Freq: Once | ORAL | Status: AC
  Administered 2018-01-02: 40 meq via ORAL
  Filled 2018-01-02: qty 2

## 2018-01-02 MED ORDER — SODIUM CHLORIDE 0.9 % IV BOLUS
1000.0000 mL | Freq: Once | INTRAVENOUS | Status: AC
  Administered 2018-01-02: 1000 mL via INTRAVENOUS

## 2018-01-02 MED ORDER — POTASSIUM CHLORIDE 10 MEQ/100ML IV SOLN
10.0000 meq | INTRAVENOUS | Status: AC
  Administered 2018-01-02 (×4): 10 meq via INTRAVENOUS
  Filled 2018-01-02 (×4): qty 100

## 2018-01-02 MED ORDER — ENOXAPARIN SODIUM 40 MG/0.4ML ~~LOC~~ SOLN
40.0000 mg | SUBCUTANEOUS | Status: DC
  Filled 2018-01-02: qty 0.4

## 2018-01-02 MED ORDER — INSULIN GLARGINE 100 UNIT/ML ~~LOC~~ SOLN
8.0000 [IU] | Freq: Every day | SUBCUTANEOUS | Status: DC
  Administered 2018-01-02: 8 [IU] via SUBCUTANEOUS
  Filled 2018-01-02 (×2): qty 0.08

## 2018-01-02 MED ORDER — DEXTROSE-NACL 5-0.45 % IV SOLN
INTRAVENOUS | Status: DC
  Administered 2018-01-02: 13:00:00 via INTRAVENOUS

## 2018-01-02 MED ORDER — SODIUM CHLORIDE 0.9 % IV SOLN
INTRAVENOUS | Status: DC
  Administered 2018-01-02: 2.5 [IU]/h via INTRAVENOUS
  Filled 2018-01-02: qty 1

## 2018-01-02 MED ORDER — ACETAMINOPHEN 325 MG PO TABS: 650.0000 mg | ORAL_TABLET | Freq: Four times a day (QID) | ORAL | Status: DC | PRN

## 2018-01-02 NOTE — H&P (Signed)
Date: 01/02/2018               Patient Name:  [name] [name] MRN: [id number]  DOB: [birth year] Age / Sex: 32 y.o., male   PCP: Hoy RegisterNewlin, Enobong, MD         Medical Service: Internal Medicine Teaching Service         Attending Physician: Dr. Levert FeinsteinGranfortuna, James M, MD    First Contact: Dr. Petra KubaVogel Pager: [phone]  Second Contact: Dr. Obie DredgeBlum Pager: [phone]       After Hours (After 5p/  First Contact Pager: [phone]  weekends / holidays): Second Contact Pager: [phone]   Chief Complaint: Nausea  History of Present Illness:  [name] [name] is a 32yo male with PMH of T1DM [facility] 2 days of nausea, vomiting and weakness.  Patient states that his CBGs had been well controlled until Sunday morning, however since then they have become significantly elevated. He then started developing palpitations (which are always his first sign of going into DKA per patient), nausea, vomiting. He also endorses polydipsia and polyuria. He denies fevers, chills, pressure like or sharp chest pain, shortness of breath, abd pain, diarrhea, dysuria, hematuria, rashes, cuts or scrapes, vision changes, headaches.   We discussed at length his insulin regimen; it appears he still has misconceptions about insulin use. On Saturday night he noted a CBG of 130 so he did not take Lantus for fear he would become hypoglycemic; he notes he doesn't really know how lantus works, but knows that the novolog works well at quickly decreasing his daytime glucose readings.   Patient was initially diagnosed with DM at age of 32 during an ED visit for polyuria and polydipsia. Recounts that he was misdiagnosed as type 2 initially due to age, and was started on oral hypoglycemics which lead to severe DKA with ARF requiring 1 day of dialysis. Since then he has only been on insulin and had + Abs indicating autoimmune DM.   In the ED, he was afebrile, normotensive, tachycardic. Lab work revealed K of 3.4, bicarb of 16, glu 382 and anion  gap of 17; cbc was unremarkable. VBG showed 7.299/31.9/60/15.6/17. Betahydroxyturic acid was >4; UA eventually resulted with 80 ketones.  Meds:  Current Meds  Medication Sig  . insulin aspart (NOVOLOG) 100 UNIT/ML injection Inject 0-12 Units into the skin 3 (three) times daily before meals. Per sliding scale (Patient taking differently: Inject 10 Units into the skin 3 (three) times daily before meals. Per sliding scale)  . Insulin Glargine (LANTUS) 100 UNIT/ML Solostar Pen Inject subcutaneously twice daily 15 units in the morning, 20 units in the evening (Patient taking differently: Inject 15 Units into the skin every morning. )   Allergies: Allergies as of 01/02/2018  . (No Known Allergies)   Past Medical History:  Diagnosis Date  . ARF (acute renal failure) ([facility]) 2011   pt was on dialysis "for 1 day approx 2011, pt is no longer on dialysis (03/09/2017)  . DKA (diabetic ketoacidoses) ([facility])    "several times" (03/09/2017)  . Type I diabetes mellitus ([facility]) Dx 2012    Family History: Father had T1DM and died of comlpications of infection; sister has T1DM and is on an insulin pump  Social History: Patient endorses 1 black and mild cigarette per day; he denies significant EtOH use (apparently last use triggered DKA less than 1 month ago so he has been staying away from it now); denies illicit drug use  Review of Systems:  A complete ROS was negative except as per HPI.   Physical Exam: Blood pressure (!) 135/101, pulse 95, temperature 98.6 F (37 C), temperature source Oral, resp. rate 17, height 5\' 6"  (1.676 m), weight 48.5 kg, SpO2 100 %. GENERAL- alert, co-operative, appears as stated age, not in any distress. HEENT- oral mucosa appears moist CARDIAC- RRR, no murmurs, rubs or gallops. RESP- Moving equal volumes of air, and clear to auscultation bilaterally, no wheezes or crackles. ABDOMEN- Soft, nontender, bowel sounds present. NEURO- No obvious Cr N abnormality. EXTREMITIES- pulse  2+, symmetric, no pedal edema. SKIN- Warm, dry, no rash or lesion. PSYCH- Normal mood and affect, appropriate thought content and speech.  EKG: personally reviewed my interpretation is sinus tachycardia; peaked P waves; chronic J point elevation in leads V2-4  Assessment & Plan by Problem: Active Problems:   DKA, type 1 ([facility])  DKA Hypokalemia Type 1 DM: Presentation of N/V, polydipsia and polyuria in setting of hyperglycemia, anion gap metabolic acidosis and ketosis consistent with DKA. Patient with recurrent episodes of DKA likely from misconceptions about T1DM management. He [facility]. From chart review it appears he was previously on daily lantus, then at hospital follow up was changed to BID dosing which patient has not done. Uncertain if BID dosing is necessary at this point due to still low doses.  --NS boluses --insulin drip; q4hr Bmets until anion gap closes x2 --replete potassium --when transitioning off of drip, will start Lantus 8 units; consider 15 units at discharge once a day  --DM coordinator consult for further education   Dispo: Admit patient to Inpatient with expected length of stay greater than 2 midnights.  Signed: Nyra MarketSvalina, Nikayla Madaris, MD 01/02/2018, 11:08 AM  IMTS - PGY3 Pager [phone]

## 2018-01-02 NOTE — ED Provider Notes (Signed)
[facility] EMERGENCY DEPARTMENT Provider Note   CSN: [id number] Arrival date & time: 01/02/18  0856     History   Chief Complaint Chief Complaint  Patient presents with  . Nausea  . Emesis    HPI [name] [name] is a 32 y.o. male.  Pt presents to the ED today in possible DKA.  The pt has a hx of frequent admissions for DKA.  He was admitted to [facility] from 7/11-13 for DKA and he was admitted to [facility] in [place] from 7/27-7/30 for DKA.  Pt said that he feels weak and has had n/v.  He has not eaten much in 3 days.  His blood sugar was 419 last night and he took his sliding scale insulin.  He did not check it today.  Pt denies f/c.      Past Medical History:  Diagnosis Date  . ARF (acute renal failure) ([place]) 2011   pt was on dialysis "for 1 day approx 2011, pt is no longer on dialysis (03/09/2017)  . DKA (diabetic ketoacidoses) ([place])    "several times" (03/09/2017)  . Type I diabetes mellitus [facility]) Dx 2012    Patient Active Problem List   Diagnosis Date Noted  . Nausea & vomiting 11/29/2017  . Diabetes mellitus type 1, uncontrolled, with complications ([place])   . Noncompliance by refusing service   . DKA (diabetic ketoacidoses) ([place]) 06/29/2017  . DKA, type 1 ([place]) 03/09/2017  . Hypophosphatemia 08/27/2016  . AKI (acute kidney injury) ([place]) 07/11/2016  . Hyperkalemia 03/09/2016  . Leukocytosis 03/09/2016  . Diabetic ketoacidosis without coma associated with type 1 diabetes mellitus ([place])   . Non-intractable vomiting with nausea   . Chest pain 12/18/2015  . Marijuana use 07/22/2015  . Proteinuria with type 1 diabetes mellitus ([place]) 07/22/2015  . Hypertension complicating diabetes ([place]) 07/22/2015  . Uncontrolled type 1 diabetes mellitus with ketoacidosis without coma ([place])   . Hypocalcemia   . Acute renal failure ([place])   . Hypokalemia   . Hypomagnesemia   . Abnormal EKG 06/15/2015  . Sinus tachycardia   . Tachypnea   . Tinea pedis  12/17/2014  . Adjustment disorder with mixed anxiety and depressed mood 09/03/2014  . Protein-calorie malnutrition, severe ([place]) 07/06/2014  . DM (diabetes mellitus), type 1, uncontrolled ([place]) 05/01/2014  . HLD (hyperlipidemia) 04/26/2014  . Substance abuse ([place])   . Dehydration 07/07/2013  . Fatigue 03/10/2012    Past Surgical History:  Procedure Laterality Date  . NO PAST SURGERIES          Home Medications    Prior to Admission medications   Medication Sig Start Date End Date Taking? Authorizing Provider  insulin aspart (NOVOLOG) 100 UNIT/ML injection Inject 0-12 Units into the skin 3 (three) times daily before meals. Per sliding scale Patient taking differently: Inject 10 Units into the skin 3 (three) times daily before meals. Per sliding scale 12/03/17  Yes Charlott Rakes, MD  Insulin Glargine (LANTUS) 100 UNIT/ML Solostar Pen Inject subcutaneously twice daily 15 units in the morning, 20 units in the evening Patient taking differently: Inject 15 Units into the skin every morning.  12/24/17  Yes Charlott Rakes, MD  Blood Glucose Monitoring Suppl (TRUE METRIX METER) w/Device KIT 1 each by Does not apply route as needed. 07/12/16   Florencia Reasons, MD    Family History Family History  Problem Relation Age of Onset  . Diabetes Father   . Diabetes Maternal Grandmother   . Heart disease  Neg Hx   . Hypertension Neg Hx     Social History Social History   Tobacco Use  . Smoking status: Former Smoker    Packs/day: 0.25    Years: 10.00    Pack years: 2.50    Types: Cigarettes    Last attempt to quit: 09/19/2013    Years since quitting: 4.2  . Smokeless tobacco: Never Used  Substance Use Topics  . Alcohol use: Yes    Alcohol/week: 0.0 standard drinks    Comment: 03/09/2017 "normally I don't drink; drank 3 shots liquor 2 days ago"  . Drug use: No     Allergies   Patient has no known allergies.   Review of Systems Review of Systems  Gastrointestinal: Positive for nausea  and vomiting.  Endocrine:       Elevated blood sugars  All other systems reviewed and are negative.    Physical Exam Updated Vital Signs BP (!) 135/101   Pulse 95   Temp 98.6 F (37 C) (Oral)   Resp 17   Ht '5\' 6"'$  (1.676 m)   Wt 48.5 kg   SpO2 100%   BMI 17.27 kg/m   Physical Exam  Constitutional: He is oriented to person, place, and time. He has a sickly appearance.  HENT:  Head: Normocephalic and atraumatic.  Right Ear: External ear normal.  Left Ear: External ear normal.  Nose: Nose normal.  Mouth/Throat: Mucous membranes are dry.  Eyes: Pupils are equal, round, and reactive to light. Conjunctivae and EOM are normal.  Neck: Normal range of motion. Neck supple.  Cardiovascular: Regular rhythm, normal heart sounds and intact distal pulses. Tachycardia present.  Pulmonary/Chest: Effort normal and breath sounds normal.  Abdominal: Soft. Bowel sounds are normal. There is tenderness in the epigastric area.  Musculoskeletal: Normal range of motion.  Neurological: He is alert and oriented to person, place, and time.  Skin: Skin is warm and dry. Capillary refill takes less than 2 seconds.  Psychiatric: He has a normal mood and affect. His behavior is normal. Judgment and thought content normal.  Nursing note and vitals reviewed.    ED Treatments / Results  Labs (all labs ordered are listed, but only abnormal results are displayed) Labs Reviewed  COMPREHENSIVE METABOLIC PANEL - Abnormal; Notable for the following components:      Result Value   Sodium 133 (*)    Potassium 3.4 (*)    CO2 16 (*)    Glucose, Bld 383 (*)    BUN 21 (*)    Total Bilirubin 1.6 (*)    Anion gap 17 (*)    All other components within normal limits  CBG MONITORING, ED - Abnormal; Notable for the following components:   Glucose-Capillary 369 (*)    All other components within normal limits  I-STAT VENOUS BLOOD GAS, ED - Abnormal; Notable for the following components:   pCO2, Ven 31.9 (*)     pO2, Ven 60.0 (*)    Bicarbonate 15.6 (*)    TCO2 17 (*)    Acid-base deficit 9.0 (*)    All other components within normal limits  LIPASE, BLOOD  CBC  URINALYSIS, ROUTINE W REFLEX MICROSCOPIC  MAGNESIUM  BETA-HYDROXYBUTYRIC ACID  CBG MONITORING, ED  CBG MONITORING, ED    EKG EKG Interpretation  Date/Time:  Wednesday January 02 2018 09:05:27 EDT Ventricular Rate:  122 PR Interval:  174 QRS Duration: 70 QT Interval:  292 QTC Calculation: 416 R Axis:   87 Text Interpretation:  Sinus tachycardia Right atrial enlargement Septal infarct , age undetermined Abnormal ECG No significant change since last tracing Confirmed by Isla Pence [phone]) on 01/02/2018 10:04:09 AM   Radiology No results found.  Procedures Procedures (including critical care time)  Medications Ordered in ED Medications  insulin regular (NOVOLIN R,HUMULIN R) 100 Units in sodium chloride 0.9 % 100 mL (1 Units/mL) infusion (has no administration in time range)  sodium chloride 0.9 % bolus 1,000 mL (1,000 mLs Intravenous New Bag/Given 01/02/18 1018)    And  0.9 %  sodium chloride infusion ( Intravenous New Bag/Given 01/02/18 1020)  dextrose 5 %-0.45 % sodium chloride infusion (has no administration in time range)  potassium chloride 10 mEq in 100 mL IVPB (has no administration in time range)  ondansetron (ZOFRAN) injection 4 mg (4 mg Intravenous Given 01/02/18 1018)     Initial Impression / Assessment and Plan / ED Course  I have reviewed the triage vital signs and the nursing notes.  Pertinent labs & imaging results that were available during my care of the patient were reviewed by me and considered in my medical decision making (see chart for details).    CRITICAL CARE Performed by: Isla Pence   Total critical care time: 30 minutes  Critical care time was exclusive of separately billable procedures and treating other patients.  Critical care was necessary to treat or prevent imminent or  life-threatening deterioration.  Critical care was time spent personally by me on the following activities: development of treatment plan with patient and/or surrogate as well as nursing, discussions with consultants, evaluation of patient's response to treatment, examination of patient, obtaining history from patient or surrogate, ordering and performing treatments and interventions, ordering and review of laboratory studies, ordering and review of radiographic studies, pulse oximetry and re-evaluation of patient's condition.  DKA protocol started.  Potassium only 3.4, so he was also given additional potassium.  Insulin drip also started.  Pt d/w IMTS for admission.  Final Clinical Impressions(s) / ED Diagnoses   Final diagnoses:  Diabetic ketoacidosis without coma associated with type 1 diabetes mellitus ([place])  Hypokalemia    ED Discharge Orders    None       Isla Pence, MD 01/02/18 1103

## 2018-01-02 NOTE — ED Triage Notes (Signed)
Pt to ER for evaluation of nausea, vomiting, and decreased appetite onset Monday. Also reports chest pain, per patient began prior to vomiting. Pt in NAD.

## 2018-01-02 NOTE — Progress Notes (Signed)
Medicine attending admission note: I personally examined this patient and reviewed all pertinent clinical, laboratory, and imaging data and I concur with the evaluation and management plan which will be documented in the history and physical note by resident physician Dr. Nyra MarketGorica Svalina.  Seventh admission this year and 27th admission since January 2016 for this 32 year old man with type 1 diabetes who insists that he is compliant with his insulin regimen.  He uses 10 units of short acting insulin with meals and 15 units of long-acting Lantus insulin daily (he told the resident physician that he also uses 20 units of Lantus in the evening).  He presents at this time with a 5-day history of nausea, vomiting, decreased oral intake of food and fluids, he believes he has lost 30 pounds.  He denies any acute infectious symptoms. Lab consistent with DKA with serum bicarbonate 16, anion gap 17, glucose 383, 80 mg percent ketonuria.  Past history remarkable for an episode of acute renal failure requiring short-term dialysis in 2011.  Full recovery. Family history remarkable for loss of his father at age 34 with cardiac disease.  Current exam: Thin African-American man in no distress after having received fluids and insulin in the emergency department. Blood pressure (!) 120/94, pulse 84, temperature 98 F (36.7 C), temperature source Oral, resp. rate 16, height 5\' 6"  (1.676 m), weight 100 lb 11.2 oz (45.7 kg), SpO2 100 %. Oropharynx without erythema or exudate.  Lungs clear to auscultation resonant to percussion throughout.  No cervical, supraclavicular, or axillary adenopathy.  Regular cardiac rhythm without murmur.  No peripheral edema. Abdomen is soft and nontender, no mass, no organomegaly, no suprapubic distention or tenderness. No focal neurologic deficits.  Additional pertinent lab: White count 4600, hemoglobin 14.1  EKG: Sinus tachycardia.  Q waves in leads V1 and V2.  No change from prior  tracing.  Impression: Poorly controlled type 1 diabetes now with recurrent episode of DKA Metabolic situation responding rapidly to treatment and his anion gap has already closed. I believe we need to get an endocrinologist involved and perhaps a counselor or psychiatrist as well to try to understand and better manage his brittle diabetes.

## 2018-01-03 DIAGNOSIS — Z9114 Patient's other noncompliance with medication regimen: Secondary | ICD-10-CM

## 2018-01-03 LAB — BASIC METABOLIC PANEL
Anion gap: 5 (ref 5–15)
BUN: 11 mg/dL (ref 6–20)
CALCIUM: 8.3 mg/dL — AB (ref 8.9–10.3)
CO2: 23 mmol/L (ref 22–32)
CREATININE: 0.58 mg/dL — AB (ref 0.61–1.24)
Chloride: 108 mmol/L (ref 98–111)
GFR calc non Af Amer: >60 mL/min (ref >60)
Glucose, Bld: 321 mg/dL — ABNORMAL HIGH (ref 70–99)
Potassium: 4.2 mmol/L (ref 3.5–5.1)
SODIUM: 136 mmol/L (ref 135–145)

## 2018-01-03 LAB — GLUCOSE, CAPILLARY
GLUCOSE-CAPILLARY: 309 mg/dL — AB (ref 70–99)
GLUCOSE-CAPILLARY: 264 mg/dL — AB (ref 70–99)

## 2018-01-03 MED ORDER — INSULIN GLARGINE 100 UNIT/ML ~~LOC~~ SOLN
20.0000 [IU] | Freq: Every day | SUBCUTANEOUS | Status: DC
  Filled 2018-01-03: qty 0.2

## 2018-01-03 MED ORDER — INSULIN ASPART 100 UNIT/ML ~~LOC~~ SOLN: 10.0000 [IU] | Freq: Three times a day (TID) | SUBCUTANEOUS | 3 refills | Status: DC

## 2018-01-03 MED ORDER — GLUCERNA SHAKE PO LIQD: 237.0000 mL | Freq: Three times a day (TID) | ORAL | 1 refills | Status: DC

## 2018-01-03 MED ORDER — INSULIN GLARGINE 100 UNIT/ML SOLOSTAR PEN: 20.0000 [IU] | PEN_INJECTOR | Freq: Every day | SUBCUTANEOUS | 3 refills | Status: DC

## 2018-01-03 MED ORDER — GLUCERNA SHAKE PO LIQD
237.0000 mL | Freq: Three times a day (TID) | ORAL | Status: DC
  Administered 2018-01-03: 237 mL via ORAL

## 2018-01-03 MED ORDER — INSULIN GLARGINE 100 UNIT/ML ~~LOC~~ SOLN
12.0000 [IU] | Freq: Once | SUBCUTANEOUS | Status: DC
  Filled 2018-01-03: qty 0.12

## 2018-01-03 MED FILL — $LANTUS SOLOSTAR 100 UNITS/: 100 | 30 days supply | Qty: 6 | Fill #0

## 2018-01-03 MED FILL — !NOVOLOG 100UNITS/ML VIAL: 100/ML | 33 days supply | Qty: 10 | Fill #0

## 2018-01-03 NOTE — Progress Notes (Signed)
Medicine attending discharge note: I personally examined this patient on the day of discharge and I attest to the accuracy of the discharge evaluation and plan as recorded in the final progress note by resident physician Dr Maryelizabeth KaufmannMarie Vogel and which will be further documented in her discharge summary.  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  Despite strong family history of diabetes in his father who was an amputee and succumbed to an infection resulting in his death at age 245  and his 34 year old sister who is currently on an insulin pump, he is still in need of education about the optimal management of his disease.  He was on a twice daily dose of Lantus insulin morning and evening with regular insulin sliding scale to cover meals.  He was running low about 5 days prior to admission and elected not to take any insulin that night.  Over the next few days he had increasing polyuria, polydipsia, anorexia, then vomiting and weight loss and went back into ketoacidosis.  No other precipitating factors.  No signs of infection. Condition stabilized rapidly with fluids and parenteral insulin.  Anion gap 17 on admission closed rapidly on the same day and was 5 on day of discharge.  Glucose 383 on admission.  321 at discharge. He was amenable to outpatient endocrinology consultation, however he may not be able to afford it at this time.  We will see if 1 of the local endocrinologist who trained in our program would be willing to see him gratis. Limiting factor in going on an insulin pump or even buying sufficient quantities of insulin is related to limited finances.  He is able to get adequate subsidized insulin from the [facility] program and we encouraged him to continue with this program.  We referred him to the [employer] diabetes [employer] website to see if there are other avenues for financial assistance.  He was also counseled by our diabetes coordinator who knows  the patient well from previous admissions.  Disposition: Condition stable at time of discharge Follow-up with the [facility] Endocrinology referral if possible There were no complications

## 2018-01-03 NOTE — Progress Notes (Signed)
Initial Nutrition Assessment  DOCUMENTATION CODES:   Severe malnutrition in context of chronic illness, Underweight  INTERVENTION:    Glucerna Shake po TID, each supplement provides 220 kcal and 10 grams of protein  NUTRITION DIAGNOSIS:   Severe Malnutrition related to chronic illness(uncontrolled DM) as evidenced by severe fat depletion, severe muscle depletion.  GOAL:   Patient will meet greater than or equal to 90% of their needs  MONITOR:   PO intake, Supplement acceptance  REASON FOR ASSESSMENT:   Malnutrition Screening Tool    ASSESSMENT:   32 yo male with PMH of type 1 DM with multiple admissions for DKA who was admitted on 8/14 with DKA.   Patient reports poor intake recently due to decreased appetite which is likely related to elevated blood glucoses. He thinks he has lost ~30 lbs recently. He is interested in learning and doing whatever he needs to do to improve his diabetes control and A1C.   Per review of weight encounters, patient has lost 8.3 kg (18 lbs) within the past month. Admitted with dehydration, which is the reason for part of her weight loss. I/O + 3.7 L since admission.    Labs reviewed. Anion gap 5. Off insulin drip. CBG's: [phone] [phone] Medications reviewed.  DM diet education provided. Patient has had multiple sessions of diet education during previous admissions. He is able to verbalize accurate CHO counting skills. [facility] and reports taking it as prescribed.   NUTRITION - FOCUSED PHYSICAL EXAM:    Most Recent Value  Orbital Region  Mild depletion  Upper Arm Region  Severe depletion  Thoracic and Lumbar Region  Severe depletion  Buccal Region  Mild depletion  Temple Region  Mild depletion  Clavicle Bone Region  Moderate depletion  Clavicle and Acromion Bone Region  Moderate depletion  Scapular Bone Region  Severe depletion  Dorsal Hand  Mild depletion  Patellar Region  Severe depletion   Anterior Thigh Region  Severe depletion  Posterior Calf Region  Severe depletion  Edema (RD Assessment)  None  Hair  Reviewed  Eyes  Reviewed  Mouth  Reviewed  Skin  Reviewed  Nails  Reviewed       Diet Order:   Diet Order            Diet Carb Modified Fluid consistency: Thin; Room service appropriate? Yes  Diet effective now              EDUCATION NEEDS:   Education needs have been addressed  Skin:  Skin Assessment: Reviewed RN Assessment  Last BM:  8/14  Height:   Ht Readings from Last 1 Encounters:  01/02/18 5\' 6"  (1.676 m)    Weight:   Wt Readings from Last 1 Encounters:  01/03/18 46.6 kg    Ideal Body Weight:  64.5 kg  BMI:  Body mass index is 16.58 kg/m.  Estimated Nutritional Needs:   Kcal:  1650-1850  Protein:  70-80 gm  Fluid:  1.6-1.8 L    Joaquin CourtsKimberly Abena Erdman, RD, LDN, CNSC Pager [phone] After Hours Pager [phone]

## 2018-01-03 NOTE — Discharge Summary (Signed)
Name: [name] [name] MRN: [id number] DOB: [birth year] 32 y.o. PCP: Charlott Rakes, MD  Date of Admission: 01/02/2018  8:59 AM Date of Discharge: 01/03/2018 Attending Physician: Dr. Beryle Beams  Discharge Diagnosis: 1. Diabetic ketoacidosis   Discharge Medications: Allergies as of 01/03/2018   No Known Allergies     Medication List    TAKE these medications   feeding supplement (GLUCERNA SHAKE) Liqd Take 237 mLs by mouth 3 (three) times daily between meals.   insulin aspart 100 UNIT/ML injection Commonly known as:  novoLOG Inject 10 Units into the skin 3 (three) times daily before meals. What changed:    how much to take  additional instructions   Insulin Glargine 100 UNIT/ML Solostar Pen Commonly known as:  LANTUS Inject 20 Units into the skin daily. What changed:    how much to take  how to take this  when to take this  additional instructions   TRUE METRIX METER w/Device Kit 1 each by Does not apply route as needed.       Disposition and follow-up:   Mr.[name] [name] was discharged from [facility] in Good condition.  At the hospital follow up visit please address:  1.  DKA - please confirm that he has started changed insulin doses- lantus 20 units qHS with novolog 10 units TID.   2.  Labs / imaging needed at time of follow-up: CBG, hemoglobin A1c   3.  Pending labs/ test needing follow-up: none   Follow-up Appointments: Follow-up Information    Charlott Rakes, MD. Go in 1 week(s).   Specialty:  Family Medicine Contact information: [place] [address] [place] [facility] Course by problem list: 1. Diabetic ketoacidosis  Presented with N/V, polydypsia, polyuria found to have hyperglycemia and anion gap metabolic acidosis consistent with DKA. He skipped the evening dose of lantus the night prior to the start of these symptoms symptoms because his evening blood glucose was 130 and he feared  that the lantus would cause hypoglycemia. On admission it was noted that his medication list had twice daily lantus and novolog with sliding scale. He reported that he was actually taking the lantus once daily. To simplify his regiment, we changed his lantus to 20 units once daily novolog and 10 units TID.   Discharge Vitals:   BP 121/75 (BP Location: Right Arm)   Pulse 87   Temp 98.4 F (36.9 C) (Oral)   Resp 14   Ht '5\' 6"'$  (1.676 m)   Wt 46.6 kg   SpO2 100%   BMI 16.58 kg/m   Pertinent Labs, Studies, and Procedures:  Last A1c 12/21/2017 13   Discharge Instructions: Discharge Instructions    Call MD for:  difficulty breathing, headache or visual disturbances   Complete by:  As directed    Call MD for:  persistant dizziness or light-headedness   Complete by:  As directed    Call MD for:  persistant nausea and vomiting   Complete by:  As directed    Diet - low sodium heart healthy   Complete by:  As directed    Discharge instructions   Complete by:  As directed    You were hospitalized for Diabetic ketoacidosis ( your blood sugar was very high and your body was not able to break it down when you stopped taking the insulin) . Thank you for allowing [place] to be part of your care.   We  sent a referral to two endocrinologist in the area, they will reach out to you if theyre able to see you in their office.   Please note these changes made to your medications, we tried to simplify your insulin doses: Start taking lantus 20 units once daily  Start taking novolog 10 units three times daily with meals   Please call our clinic if you have any questions or concerns, we may be able to help and keep you from a long and expensive emergency room wait. Our clinic and after hours phone number is [phone], the best time to call is Monday through Friday 9 am to 4 pm but there is always someone available 24/7 if you have an emergency.   Increase activity slowly   Complete by:  As directed        Signed: Ledell Noss, MD 01/03/2018, 7:44 PM   Pager: [phone]

## 2018-01-03 NOTE — Progress Notes (Signed)
Spoke with patient at length regarding diabetes and diabetes management.  He is well known to me from previous admissions.  Patient has lost significant weight (he states 10-20 pounds recently).  He has 8 year history of type 1 DM, however his resources/f/u have been limited since diagnosis.  We discussed what type 1 DM is, how insulin works and that his pancreas does not make insulin.  We discussed basal vs. Bolus insulin.  We also discussed how the body goes into starvation without insulin and breaks down muscle/fat which leads to ketoacidosis.  I reminded him that he needs insulin every day and that he cannot skip doses of insulin or he will go in DKA. Gave him diabetes magazine that had several resources and articles related to DM. We also discussed how improved DM control will help him gain weight.  We discussed motivators for self-care.  Patient states that his daughter is 34 years old, and he wants to be present with her as long as possible.  We discussed that diabetes management is important to prevent complications and also that it is attainable with correct insulin dosing, monitoring and follow-up.  Patient is frustrated but did acknowledge that "I need to do my part and be disciplined to care for my self and my disease".  Attempted to encourage patient and set attainable goals such as 1) taking insulin daily 2) checking blood sugars at least 2 times a day and 3) following up with MD (he would like to see endocrinologist and thinks that his mom may be able to help him with cost since he is uninsured).    Thanks,  Beryl MeagerJenny Raseel Jans, RN, BC-ADM Inpatient Diabetes Coordinator Pager [phone] (8a-5p)

## 2018-01-03 NOTE — Progress Notes (Signed)
   Subjective: NAEON. Tolerating po well. Off insulin drip. Discussed endocrinology referral for his brittle diabetes.   Objective:  Vital signs in last 24 hours: Vitals:   01/02/18 2339 01/03/18 0426 01/03/18 0701 01/03/18 1132  BP:  131/89 (!) 125/100 121/75  Pulse: 80 81 82 87  Resp: 14 14    Temp: 98.4 F (36.9 C) 97.8 F (36.6 C) 98 F (36.7 C) 98.4 F (36.9 C)  TempSrc: Oral Oral Oral Oral  SpO2: 100% 100% 100% 100%  Weight:  46.6 kg    Height:       Gen: Well appearing, NAD CV: RRR, no murmurs Pulm: Normal effort, CTA throughout, no wheezing Abd: Soft, NT, ND, normal BS.  Ext: Warm, no edema, normal joints Skin:  No rashes   Assessment/Plan:  Active Problems:   DKA, type 1 [facility])  [name] [name] is a 32yo male with PMH of T1DM [facility] with 2 days of nausea, vomiting and weakness and labs consistent with DKA.   DKA Hypokalemia Type 1 DM: Anion gap closed x 3. Tolerating po. His diabetes appears to be very brittle and hard to manage. [facility] and gets discounted insulin through their program. Would benefit from insulin pump but lack of insurance makes this difficult.  --potassium normal  -- Lantus 8U qhs -- Novolog 0-9 SSI TIC wc --DM coordinator consult for further education -- referral to endocrinology     Dispo: Anticipated discharge in approximately today  Ali LoweVogel, Bailea Beed S, MD 01/03/2018, 12:09 PM Pager: [phone]

## 2018-01-03 NOTE — Progress Notes (Signed)
Patient received discharge information and acknowledged understanding of it. Patient IV was removed.  

## 2018-01-03 NOTE — Progress Notes (Signed)
Inpatient Diabetes Program Recommendations  AACE/ADA: New Consensus Statement on Inpatient Glycemic Control (2015)  Target Ranges:  Prepandial:   less than 140 mg/dL      Peak postprandial:   less than 180 mg/dL (1-2 hours)      Critically ill patients:  140 - 180 mg/dL   Lab Results  Component Value Date   GLUCAP 264 (H) 01/03/2018   HGBA1C 13.0 (H) 12/21/2017    Review of Glycemic ControlResults for [name], Koichi (MRN [id number]) as of 01/03/2018 13:07  Ref. Range 01/02/2018 18:21 01/02/2018 19:23 01/02/2018 20:31 01/03/2018 07:48 01/03/2018 11:31  Glucose-Capillary Latest Ref Range: 70 - 99 mg/dL 99 97 409159 (H) 811309 (H) 914264 (H)    Diabetes history: Type 1 DM since age 32 Outpatient Diabetes medications:  Lantus 15 units in the morning and 20 units in the evening (patient only taking Lantus 15 units once a day), Novolog 10 units tid with meals Current orders for Inpatient glycemic control:  Novolog sensitive tid with meals, Lantus 8 units daily Inpatient Diabetes Program Recommendations:    Repeated DKA admissions.  I am very familiar with this patient from past admissions with DKA.  He has been educated during each admission on DM management, insulins, etc.  He lacks resources for management and times and likely will not be able to see endocrinologist due to lack of insurance. Discussed with MD.  Consider increasing Lantus to 20 units daily.  He will also need Novolog meal coverage as well.  Will talk to patient again prior to d/c.   Thanks,  Beryl MeagerJenny Faizah Kandler, RN, BC-ADM Inpatient Diabetes Coordinator Pager [phone] (8a-5p)

## 2018-01-28 ENCOUNTER — Other Ambulatory Visit: Payer: Self-pay

## 2018-01-28 ENCOUNTER — Inpatient Hospital Stay (HOSPITAL_COMMUNITY): Payer: Self-pay

## 2018-01-28 ENCOUNTER — Inpatient Hospital Stay (HOSPITAL_COMMUNITY)
Admission: EM | Admit: 2018-01-28 | Discharge: 2018-01-29 | DRG: 638 | Discharge status: Against Medical Advice | Payer: Self-pay | Attending: Family Medicine | Admitting: Family Medicine

## 2018-01-28 ENCOUNTER — Encounter (HOSPITAL_COMMUNITY): Payer: Self-pay | Admitting: Emergency Medicine

## 2018-01-28 DIAGNOSIS — N179 Acute kidney failure, unspecified: Secondary | ICD-10-CM | POA: Diagnosis present

## 2018-01-28 DIAGNOSIS — D72829 Elevated white blood cell count, unspecified: Secondary | ICD-10-CM | POA: Diagnosis present

## 2018-01-28 DIAGNOSIS — Z79899 Other long term (current) drug therapy: Secondary | ICD-10-CM

## 2018-01-28 DIAGNOSIS — E860 Dehydration: Secondary | ICD-10-CM | POA: Diagnosis present

## 2018-01-28 DIAGNOSIS — E10649 Type 1 diabetes mellitus with hypoglycemia without coma: Secondary | ICD-10-CM | POA: Diagnosis not present

## 2018-01-28 DIAGNOSIS — Z87891 Personal history of nicotine dependence: Secondary | ICD-10-CM

## 2018-01-28 DIAGNOSIS — Z794 Long term (current) use of insulin: Secondary | ICD-10-CM

## 2018-01-28 DIAGNOSIS — Z833 Family history of diabetes mellitus: Secondary | ICD-10-CM

## 2018-01-28 DIAGNOSIS — E1065 Type 1 diabetes mellitus with hyperglycemia: Secondary | ICD-10-CM | POA: Diagnosis present

## 2018-01-28 DIAGNOSIS — E1010 Type 1 diabetes mellitus with ketoacidosis without coma: Principal | ICD-10-CM | POA: Diagnosis present

## 2018-01-28 DIAGNOSIS — F1210 Cannabis abuse, uncomplicated: Secondary | ICD-10-CM | POA: Diagnosis present

## 2018-01-28 DIAGNOSIS — E1110 Type 2 diabetes mellitus with ketoacidosis without coma: Secondary | ICD-10-CM | POA: Diagnosis present

## 2018-01-28 DIAGNOSIS — IMO0002 Reserved for concepts with insufficient information to code with codable children: Secondary | ICD-10-CM | POA: Diagnosis present

## 2018-01-28 DIAGNOSIS — F1290 Cannabis use, unspecified, uncomplicated: Secondary | ICD-10-CM

## 2018-01-28 DIAGNOSIS — R112 Nausea with vomiting, unspecified: Secondary | ICD-10-CM

## 2018-01-28 LAB — URINALYSIS, ROUTINE W REFLEX MICROSCOPIC
BILIRUBIN URINE: NEGATIVE
Glucose, UA: >=500 mg/dL — AB
Hgb urine dipstick: NEGATIVE
Ketones, ur: 80 mg/dL — AB
Leukocytes, UA: NEGATIVE
NITRITE: NEGATIVE
Protein, ur: NEGATIVE mg/dL
SPECIFIC GRAVITY, URINE: 1.02 (ref 1.005–1.030)
pH: 5 (ref 5.0–8.0)

## 2018-01-28 LAB — BASIC METABOLIC PANEL
ANION GAP: 29 — AB (ref 5–15)
ANION GAP: 21 — AB (ref 5–15)
Anion gap: 14 (ref 5–15)
Anion gap: 6 (ref 5–15)
Anion gap: 11 (ref 5–15)
BUN: 31 mg/dL — ABNORMAL HIGH (ref 6–20)
BUN: 27 mg/dL — ABNORMAL HIGH (ref 6–20)
BUN: 22 mg/dL — AB (ref 6–20)
BUN: 19 mg/dL (ref 6–20)
BUN: 19 mg/dL (ref 6–20)
CHLORIDE: 89 mmol/L — AB (ref 98–111)
CHLORIDE: 114 mmol/L — AB (ref 98–111)
CHLORIDE: 117 mmol/L — AB (ref 98–111)
CHLORIDE: 115 mmol/L — AB (ref 98–111)
CO2: 19 mmol/L — ABNORMAL LOW (ref 22–32)
CO2: 16 mmol/L — ABNORMAL LOW (ref 22–32)
CO2: 17 mmol/L — AB (ref 22–32)
CO2: 24 mmol/L (ref 22–32)
CO2: 22 mmol/L (ref 22–32)
CREATININE: 0.92 mg/dL (ref 0.61–1.24)
CREATININE: 0.66 mg/dL (ref 0.61–1.24)
Calcium: 9.9 mg/dL (ref 8.9–10.3)
Calcium: 8.8 mg/dL — ABNORMAL LOW (ref 8.9–10.3)
Calcium: 8.2 mg/dL — ABNORMAL LOW (ref 8.9–10.3)
Calcium: 8.4 mg/dL — ABNORMAL LOW (ref 8.9–10.3)
Calcium: 8.6 mg/dL — ABNORMAL LOW (ref 8.9–10.3)
Chloride: 106 mmol/L (ref 98–111)
Creatinine, Ser: 1.42 mg/dL — ABNORMAL HIGH (ref 0.61–1.24)
Creatinine, Ser: 1.06 mg/dL (ref 0.61–1.24)
Creatinine, Ser: 0.6 mg/dL — ABNORMAL LOW (ref 0.61–1.24)
GFR calc Af Amer: >60 mL/min (ref >60)
GFR calc Af Amer: >60 mL/min (ref >60)
GFR calc non Af Amer: >60 mL/min (ref >60)
GFR calc non Af Amer: >60 mL/min (ref >60)
GFR calc non Af Amer: >60 mL/min (ref >60)
GFR calc non Af Amer: >60 mL/min (ref >60)
Glucose, Bld: 789 mg/dL (ref 70–99)
Glucose, Bld: 500 mg/dL — ABNORMAL HIGH (ref 70–99)
Glucose, Bld: 259 mg/dL — ABNORMAL HIGH (ref 70–99)
Glucose, Bld: 153 mg/dL — ABNORMAL HIGH (ref 70–99)
Glucose, Bld: 90 mg/dL (ref 70–99)
POTASSIUM: 4.5 mmol/L (ref 3.5–5.1)
POTASSIUM: 5.2 mmol/L — AB (ref 3.5–5.1)
POTASSIUM: 3.8 mmol/L (ref 3.5–5.1)
POTASSIUM: 3.7 mmol/L (ref 3.5–5.1)
Potassium: 4.2 mmol/L (ref 3.5–5.1)
SODIUM: 137 mmol/L (ref 135–145)
SODIUM: 143 mmol/L (ref 135–145)
SODIUM: 145 mmol/L (ref 135–145)
SODIUM: 147 mmol/L — AB (ref 135–145)
Sodium: 148 mmol/L — ABNORMAL HIGH (ref 135–145)

## 2018-01-28 LAB — GLUCOSE, CAPILLARY
GLUCOSE-CAPILLARY: 567 mg/dL — AB (ref 70–99)
GLUCOSE-CAPILLARY: 288 mg/dL — AB (ref 70–99)
GLUCOSE-CAPILLARY: 301 mg/dL — AB (ref 70–99)
GLUCOSE-CAPILLARY: 208 mg/dL — AB (ref 70–99)
GLUCOSE-CAPILLARY: 179 mg/dL — AB (ref 70–99)
GLUCOSE-CAPILLARY: 108 mg/dL — AB (ref 70–99)
Glucose-Capillary: 113 mg/dL — ABNORMAL HIGH (ref 70–99)
Glucose-Capillary: 143 mg/dL — ABNORMAL HIGH (ref 70–99)
Glucose-Capillary: 174 mg/dL — ABNORMAL HIGH (ref 70–99)
Glucose-Capillary: 391 mg/dL — ABNORMAL HIGH (ref 70–99)
Glucose-Capillary: 73 mg/dL (ref 70–99)
Glucose-Capillary: 92 mg/dL (ref 70–99)
Glucose-Capillary: 93 mg/dL (ref 70–99)

## 2018-01-28 LAB — BLOOD GAS, VENOUS
Acid-base deficit: 12.2 mmol/L — ABNORMAL HIGH (ref 0.0–2.0)
BICARBONATE: 16 mmol/L — AB (ref 20.0–28.0)
O2 Saturation: 53.9 %
PH VEN: 7.176 — AB (ref 7.250–7.430)
Patient temperature: 98.6
pCO2, Ven: 45.1 mmHg (ref 44.0–60.0)
pO2, Ven: 36.2 mmHg (ref 32.0–45.0)

## 2018-01-28 LAB — CBC WITH DIFFERENTIAL/PLATELET
Basophils Absolute: 0 10*3/uL (ref 0.0–0.1)
Basophils Relative: 0 %
EOS ABS: 0 10*3/uL (ref 0.0–0.7)
Eosinophils Relative: 0 %
HEMATOCRIT: 42.6 % (ref 39.0–52.0)
HEMOGLOBIN: 14.1 g/dL (ref 13.0–17.0)
Lymphocytes Relative: 6 %
Lymphs Abs: 0.7 10*3/uL (ref 0.7–4.0)
MCH: 30.7 pg (ref 26.0–34.0)
MCHC: 33.1 g/dL (ref 30.0–36.0)
MCV: 92.8 fL (ref 78.0–100.0)
MONOS PCT: 5 %
Monocytes Absolute: 0.5 10*3/uL (ref 0.1–1.0)
NEUTROS ABS: 9.6 10*3/uL — AB (ref 1.7–7.7)
NEUTROS PCT: 89 %
Platelets: 340 10*3/uL (ref 150–400)
RBC: 4.59 MIL/uL (ref 4.22–5.81)
RDW: 13.2 % (ref 11.5–15.5)
WBC: 10.8 10*3/uL — AB (ref 4.0–10.5)

## 2018-01-28 LAB — CBG MONITORING, ED
Glucose-Capillary: >600 mg/dL (ref 70–99)
Glucose-Capillary: >600 mg/dL (ref 70–99)

## 2018-01-28 LAB — BETA-HYDROXYBUTYRIC ACID

## 2018-01-28 LAB — MRSA PCR SCREENING: MRSA BY PCR: NEGATIVE

## 2018-01-28 MED ORDER — SODIUM CHLORIDE 0.9 % IV BOLUS
1000.0000 mL | Freq: Once | INTRAVENOUS | Status: AC
  Administered 2018-01-28: 1000 mL via INTRAVENOUS

## 2018-01-28 MED ORDER — DEXTROSE-NACL 5-0.45 % IV SOLN
INTRAVENOUS | Status: DC
  Administered 2018-01-28: 16:00:00 via INTRAVENOUS

## 2018-01-28 MED ORDER — ONDANSETRON HCL 4 MG PO TABS: 4.0000 mg | ORAL_TABLET | Freq: Four times a day (QID) | ORAL | Status: DC | PRN

## 2018-01-28 MED ORDER — TRAMADOL HCL 50 MG PO TABS: 50.0000 mg | ORAL_TABLET | Freq: Four times a day (QID) | ORAL | Status: DC | PRN

## 2018-01-28 MED ORDER — HEPARIN SODIUM (PORCINE) 5000 UNIT/ML IJ SOLN
5000.0000 [IU] | Freq: Three times a day (TID) | INTRAMUSCULAR | Status: DC
  Administered 2018-01-28: 5000 [IU] via SUBCUTANEOUS
  Filled 2018-01-28: qty 1

## 2018-01-28 MED ORDER — SODIUM CHLORIDE 0.9 % IV SOLN
INTRAVENOUS | Status: DC
  Administered 2018-01-28: 22:00:00 via INTRAVENOUS

## 2018-01-28 MED ORDER — ONDANSETRON HCL 4 MG/2ML IJ SOLN: 4.0000 mg | Freq: Four times a day (QID) | INTRAMUSCULAR | Status: DC | PRN

## 2018-01-28 MED ORDER — POTASSIUM CHLORIDE 10 MEQ/100ML IV SOLN
10.0000 meq | INTRAVENOUS | Status: AC
  Administered 2018-01-28 (×2): 10 meq via INTRAVENOUS
  Filled 2018-01-28 (×2): qty 100

## 2018-01-28 MED ORDER — SODIUM CHLORIDE 0.9% FLUSH
3.0000 mL | Freq: Two times a day (BID) | INTRAVENOUS | Status: DC
  Administered 2018-01-28: 3 mL via INTRAVENOUS

## 2018-01-28 MED ORDER — INSULIN ASPART 100 UNIT/ML ~~LOC~~ SOLN: 0.0000 [IU] | SUBCUTANEOUS | Status: DC

## 2018-01-28 MED ORDER — SENNOSIDES-DOCUSATE SODIUM 8.6-50 MG PO TABS: 1.0000 | ORAL_TABLET | Freq: Every evening | ORAL | Status: DC | PRN

## 2018-01-28 MED ORDER — ALBUTEROL SULFATE (2.5 MG/3ML) 0.083% IN NEBU: 2.5000 mg | INHALATION_SOLUTION | RESPIRATORY_TRACT | Status: DC | PRN

## 2018-01-28 MED ORDER — ACETAMINOPHEN 325 MG PO TABS: 650.0000 mg | ORAL_TABLET | Freq: Four times a day (QID) | ORAL | Status: DC | PRN

## 2018-01-28 MED ORDER — ONDANSETRON HCL 4 MG/2ML IJ SOLN
4.0000 mg | Freq: Once | INTRAMUSCULAR | Status: AC
  Administered 2018-01-28: 4 mg via INTRAVENOUS
  Filled 2018-01-28: qty 2

## 2018-01-28 MED ORDER — SODIUM CHLORIDE 0.9 % IV SOLN
INTRAVENOUS | Status: DC
  Administered 2018-01-28: 13:00:00 via INTRAVENOUS

## 2018-01-28 MED ORDER — INSULIN GLARGINE 100 UNIT/ML ~~LOC~~ SOLN
10.0000 [IU] | Freq: Once | SUBCUTANEOUS | Status: AC
  Administered 2018-01-28: 10 [IU] via SUBCUTANEOUS
  Filled 2018-01-28: qty 0.1

## 2018-01-28 MED ORDER — ACETAMINOPHEN 650 MG RE SUPP: 650.0000 mg | Freq: Four times a day (QID) | RECTAL | Status: DC | PRN

## 2018-01-28 MED ORDER — INSULIN REGULAR HUMAN 100 UNIT/ML IJ SOLN
INTRAMUSCULAR | Status: DC
  Administered 2018-01-28: 5.4 [IU]/h via INTRAVENOUS
  Filled 2018-01-28: qty 1

## 2018-01-28 NOTE — Progress Notes (Signed)
Inpatient Diabetes Program Recommendations  AACE/ADA: New Consensus Statement on Inpatient Glycemic Control (2015)  Target Ranges:  Prepandial:   less than 140 mg/dL      Peak postprandial:   less than 180 mg/dL (1-2 hours)      Critically ill patients:  140 - 180 mg/dL   Lab Results  Component Value Date   GLUCAP >600 (HH) 01/28/2018   HGBA1C 13.0 (H) 12/21/2017    Review of Glycemic Control  Diabetes history: DM1 Outpatient Diabetes medications: Lantus 20 units QHS, Novolog 10 units tidwc Current orders for Inpatient glycemic control: IV insulin per GlucoStabilizer per DKA orders  HgbA1C - 13.0% - uncontrolled  Repeated DKA admissions. Diabetes Coordinators very familiar with pt from past admissions with DKA. [name] KitchenHe has been educated during each admission on DM management, insulins, etc.  He lacks resources for management and times and likely will not be able to see endocrinologist due to lack of insurance.  Will speak with pt about HgbA1C of 13.0%.  Thank you. Ailene Ards, RD, LDN, CDE Inpatient Diabetes Coordinator [phone]

## 2018-01-28 NOTE — ED Notes (Signed)
Bed: QM25 Expected date: 01/28/18 Expected time: 8:44 AM Means of arrival: Ambulance Comments: Hyperglycemia

## 2018-01-28 NOTE — ED Provider Notes (Signed)
[facility] DEPT Provider Note   CSN: [id number] Arrival date & time: 01/28/18  0846     History   Chief Complaint Chief Complaint  Patient presents with  . Hyperglycemia    HPI [name] [name] is a 32 y.o. male.  The history is provided by the patient.  Hyperglycemia  Blood sugar level PTA:  >600 Severity:  Moderate Onset quality:  Gradual Timing:  Constant Progression:  Worsening Chronicity:  New Diabetes status:  Controlled with insulin Current diabetic therapy:  Novolog, lantus Context: recent illness (patient with n/v/d the last 2 days)   Context: not noncompliance   Relieved by:  Nothing Ineffective treatments:  Insulin Associated symptoms: nausea and vomiting   Associated symptoms: no abdominal pain, no chest pain, no dysuria, no fever and no shortness of breath     Past Medical History:  Diagnosis Date  . ARF (acute renal failure) ([place]) 2011   pt was on dialysis "for 1 day approx 2011, pt is no longer on dialysis (03/09/2017)  . DKA (diabetic ketoacidoses) ([place])    "several times" (03/09/2017)  . Type I diabetes mellitus [facility]) Dx 2012    Patient Active Problem List   Diagnosis Date Noted  . Nausea & vomiting 11/29/2017  . Diabetes mellitus type 1, uncontrolled, with complications ([place])   . Noncompliance by refusing service   . DKA (diabetic ketoacidoses) ([place]) 06/29/2017  . DKA, type 1 ([place]) 03/09/2017  . Hypophosphatemia 08/27/2016  . AKI (acute kidney injury) ([place]) 07/11/2016  . Hyperkalemia 03/09/2016  . Leukocytosis 03/09/2016  . Diabetic ketoacidosis without coma associated with type 1 diabetes mellitus ([place])   . Non-intractable vomiting with nausea   . Chest pain 12/18/2015  . Marijuana use 07/22/2015  . Proteinuria with type 1 diabetes mellitus ([place]) 07/22/2015  . Hypertension complicating diabetes ([place]) 07/22/2015  . Uncontrolled type 1 diabetes mellitus with ketoacidosis without coma ([place])   . Hypocalcemia     . Acute renal failure ([place])   . Hypokalemia   . Hypomagnesemia   . Abnormal EKG 06/15/2015  . Sinus tachycardia   . Tachypnea   . Tinea pedis 12/17/2014  . Adjustment disorder with mixed anxiety and depressed mood 09/03/2014  . Protein-calorie malnutrition, severe ([place]) 07/06/2014  . DM (diabetes mellitus), type 1, uncontrolled ([place]) 05/01/2014  . HLD (hyperlipidemia) 04/26/2014  . Substance abuse ([place])   . Dehydration 07/07/2013  . Fatigue 03/10/2012    Past Surgical History:  Procedure Laterality Date  . NO PAST SURGERIES          Home Medications    Prior to Admission medications   Medication Sig Start Date End Date Taking? Authorizing Provider  feeding supplement, GLUCERNA SHAKE, (GLUCERNA SHAKE) LIQD Take 237 mLs by mouth 3 (three) times daily between meals. 01/03/18  Yes Ledell Noss, MD  insulin aspart (NOVOLOG) 100 UNIT/ML injection Inject 10 Units into the skin 3 (three) times daily before meals. 01/03/18  Yes Ledell Noss, MD  Insulin Glargine (LANTUS) 100 UNIT/ML Solostar Pen Inject 20 Units into the skin daily. 01/03/18  Yes Ledell Noss, MD  Blood Glucose Monitoring Suppl (TRUE METRIX METER) w/Device KIT 1 each by Does not apply route as needed. 07/12/16   Florencia Reasons, MD    Family History Family History  Problem Relation Age of Onset  . Diabetes Father   . Diabetes Maternal Grandmother   . Heart disease Neg Hx   . Hypertension Neg Hx     Social History Social History  Tobacco Use  . Smoking status: Former Smoker    Packs/day: 0.25    Years: 10.00    Pack years: 2.50    Types: Cigarettes    Last attempt to quit: 09/19/2013    Years since quitting: 4.3  . Smokeless tobacco: Never Used  Substance Use Topics  . Alcohol use: Yes    Alcohol/week: 0.0 standard drinks    Comment: 03/09/2017 "normally I don't drink; drank 3 shots liquor 2 days ago"  . Drug use: No     Allergies   Patient has no known allergies.   Review of Systems Review of Systems   Constitutional: Negative for chills and fever.  HENT: Negative for ear pain and sore throat.   Eyes: Negative for pain and visual disturbance.  Respiratory: Negative for cough and shortness of breath.   Cardiovascular: Negative for chest pain and palpitations.  Gastrointestinal: Positive for diarrhea, nausea and vomiting. Negative for abdominal distention and abdominal pain.  Genitourinary: Negative for difficulty urinating, dysuria and hematuria.  Musculoskeletal: Negative for arthralgias and back pain.  Skin: Negative for color change and rash.  Neurological: Negative for seizures and syncope.  All other systems reviewed and are negative.    Physical Exam Updated Vital Signs  ED Triage Vitals  Enc Vitals Group     BP 01/28/18 0850 114/70     Pulse Rate 01/28/18 0850 (!) 110     Resp 01/28/18 0850 15     Temp 01/28/18 0850 98.1 F (36.7 C)     Temp Source 01/28/18 0850 Oral     SpO2 01/28/18 0850 100 %     Weight --      Height 01/28/18 0850 5' 6" (1.676 m)     Head Circumference --      Peak Flow --      Pain Score 01/28/18 0852 0     Pain Loc --      Pain Edu? --      Excl. in [place]? --     Physical Exam  Constitutional: He appears well-developed and well-nourished. He appears distressed.  HENT:  Head: Normocephalic and atraumatic.  Mouth/Throat: No oropharyngeal exudate.  Eyes: Pupils are equal, round, and reactive to light. Conjunctivae and EOM are normal.  Neck: Normal range of motion. Neck supple.  Cardiovascular: Regular rhythm and intact distal pulses. Tachycardia present.  No murmur heard. Pulmonary/Chest: Effort normal and breath sounds normal. No respiratory distress. He has no wheezes.  Abdominal: Soft. He exhibits no distension. There is no tenderness.  Musculoskeletal: Normal range of motion. He exhibits no edema.  Neurological: He is alert.  Skin: Skin is warm and dry. Capillary refill takes less than 2 seconds.  Psychiatric: He has a normal mood and  affect.  Nursing note and vitals reviewed.    ED Treatments / Results  Labs (all labs ordered are listed, but only abnormal results are displayed) Labs Reviewed  BASIC METABOLIC PANEL - Abnormal; Notable for the following components:      Result Value   Chloride 89 (*)    CO2 19 (*)    Glucose, Bld 789 (*)    BUN 31 (*)    Creatinine, Ser 1.42 (*)    Anion gap 29 (*)    All other components within normal limits  BLOOD GAS, VENOUS - Abnormal; Notable for the following components:   pH, Ven 7.176 (*)    Bicarbonate 16.0 (*)    Acid-base deficit 12.2 (*)    All  other components within normal limits  CBC WITH DIFFERENTIAL/PLATELET - Abnormal; Notable for the following components:   WBC 10.8 (*)    Neutro Abs 9.6 (*)    All other components within normal limits  CBG MONITORING, ED - Abnormal; Notable for the following components:   Glucose-Capillary >600 (*)    All other components within normal limits  URINALYSIS, ROUTINE W REFLEX MICROSCOPIC  BETA-HYDROXYBUTYRIC ACID  CBG MONITORING, ED    EKG None  Radiology No results found.  Procedures .Critical Care Performed by: Lennice Sites, DO Authorized by: Lennice Sites, DO   Critical care provider statement:    Critical care time (minutes):  35   Critical care time was exclusive of:  Separately billable procedures and treating other patients and teaching time   Critical care was necessary to treat or prevent imminent or life-threatening deterioration of the following conditions:  Endocrine crisis   Critical care was time spent personally by me on the following activities:  Development of treatment plan with patient or surrogate, discussions with primary provider, evaluation of patient's response to treatment, examination of patient, ordering and performing treatments and interventions, ordering and review of laboratory studies, ordering and review of radiographic studies, re-evaluation of patient's condition, pulse oximetry  and review of old charts   I assumed direction of critical care for this patient from another provider in my specialty: no     (including critical care time)  Medications Ordered in ED Medications  sodium chloride 0.9 % bolus 1,000 mL (1,000 mLs Intravenous New Bag/Given (Non-Interop) 01/28/18 0908)  sodium chloride 0.9 % bolus 1,000 mL (has no administration in time range)  potassium chloride 10 mEq in 100 mL IVPB (has no administration in time range)  insulin regular (NOVOLIN R,HUMULIN R) 100 Units in sodium chloride 0.9 % 100 mL (1 Units/mL) infusion (has no administration in time range)  ondansetron (ZOFRAN) injection 4 mg (4 mg Intravenous Given 01/28/18 0908)     Initial Impression / Assessment and Plan / ED Course  I have reviewed the triage vital signs and the nursing notes.  Pertinent labs & imaging results that were available during my care of the patient were reviewed by me and considered in my medical decision making (see chart for details).     [name] [name] is a 32 year old male history of diabetes insulin-dependent who presents to the ED with nausea, vomiting, diarrhea.  Patient with tachycardia but otherwise normal vitals.  No fever.  Patient with symptoms for the last 2 days.  No obvious exposures however does work in the [employer].  Patient with history of DKA.  Denies any abdominal pain.  States that he has been taking his insulin daily and last took his Lantus this morning.  Patient with active vomiting upon my evaluation but no abdominal tenderness.  Appears clinically dehydrated.  Concern for DKA and lab work done.  Patient given normal saline bolus.  Patient to be reevaluated and given IV Zofran as well.  Patient with pH of 7.17, glucose of 789, bicarb of 19, AKI with creatinine of 1.42.  Anion gap is 29.  Patient with DKA.  Started on insulin drip and given potassium replacement as well although potassium 4.5.  Patient given additional liter of fluid given AKI as  well.  Hemodynamics improved following fluids and patient to be admitted to medicine for further care for DKA.  EKG shows sinus tachycardia, no signs of ST changes.  Final Clinical Impressions(s) / ED Diagnoses  Final diagnoses:  Diabetic ketoacidosis without coma associated with type 1 diabetes mellitus ([place])  AKI (acute kidney injury) ([place])  Nausea and vomiting, intractability of vomiting not specified, unspecified vomiting type    ED Discharge Orders    None       Lennice Sites, DO 01/28/18 1015

## 2018-01-28 NOTE — H&P (Signed)
History and Physical  [name] [name] OEH:[id number] DOB: 1986-04-10 DOA: 01/28/2018    PCP: [name] Rakes, MD  Patient coming from: Home & is able to ambulate independently  Chief Complaint: N/V, polyuria for 2 days  HPI: [name] [name] is a 32 y.o. male with medical history significant for type 1 diabetes, presents to the ED complaining of nausea/vomiting, polyuria, fatigue, loose stools for the past 2 days.  Patient reports compliance to his insulin regimen and diet.  Patient denies any chest pain, fever/chills, cough, abdominal pain, dysuria.  Of note, patient has multiple visits, at least once every month for the past couple of months for similar issues.  Last visit was in 12/2017.  ED Course: Patient was noted to be in DKA, with CBG of 789, VBG with pH of 7.1, bicarb of 19, anion gap of 29.  Also noted to be in AKI with creatinine of 1.42, WBC 10.8.  She was started on insulin drip, given IV fluid boluses.  Pt admitted for further management.  Review of Systems: Review of systems are otherwise negative   Past Medical History:  Diagnosis Date  . ARF (acute renal failure) ([place]) 2011   pt was on dialysis "for 1 day approx 2011, pt is no longer on dialysis (03/09/2017)  . DKA (diabetic ketoacidoses) ([place])    "several times" (03/09/2017)  . Type I diabetes mellitus ([place]) Dx 2012   Past Surgical History:  Procedure Laterality Date  . NO PAST SURGERIES      Social History:  reports that he quit smoking about 4 years ago. His smoking use included cigarettes. He has a 2.50 pack-year smoking history. He has never used smokeless tobacco. He reports that he drinks alcohol. He reports that he does not use drugs.   No Known Allergies  Family History  Problem Relation Age of Onset  . Diabetes Father   . Diabetes Maternal Grandmother   . Heart disease Neg Hx   . Hypertension Neg Hx       Prior to Admission medications   Medication Sig Start Date End Date Taking? Authorizing Provider    feeding supplement, GLUCERNA SHAKE, (GLUCERNA SHAKE) LIQD Take 237 mLs by mouth 3 (three) times daily between meals. 01/03/18  Yes Ledell Noss, MD  insulin aspart (NOVOLOG) 100 UNIT/ML injection Inject 10 Units into the skin 3 (three) times daily before meals. 01/03/18  Yes Ledell Noss, MD  Insulin Glargine (LANTUS) 100 UNIT/ML Solostar Pen Inject 20 Units into the skin daily. 01/03/18  Yes Ledell Noss, MD  Blood Glucose Monitoring Suppl (TRUE METRIX METER) w/Device KIT 1 each by Does not apply route as needed. 07/12/16   Florencia Reasons, MD    Physical Exam: BP (!) 142/103   Pulse (!) 114   Temp 98.1 F (36.7 C) (Oral)   Resp 19   Ht '5\' 6"'$  (1.676 m)   SpO2 100%   BMI 16.58 kg/m   General: Looks dehydrated, NAD Eyes: Normal ENT: Normal Neck: Supple Cardiovascular: S1, S2 present Respiratory: CTAB  Abdomen: Soft, NT, ND, BS present Skin: Normal Musculoskeletal: No pedal edema bilaterally Psychiatric: Normal mood Neurologic: No focal neurologic deficit noted           Labs on Admission:  Basic Metabolic Panel: Recent Labs  Lab 01/28/18 0922  NA 137  K 4.5  CL 89*  CO2 19*  GLUCOSE 789*  BUN 31*  CREATININE 1.42*  CALCIUM 9.9   Liver Function Tests: No results for input(s): AST, ALT, ALKPHOS, BILITOT,  PROT, ALBUMIN in the last 168 hours. No results for input(s): LIPASE, AMYLASE in the last 168 hours. No results for input(s): AMMONIA in the last 168 hours. CBC: Recent Labs  Lab 01/28/18 0922  WBC 10.8*  NEUTROABS 9.6*  HGB 14.1  HCT 42.6  MCV 92.8  PLT 340   Cardiac Enzymes: No results for input(s): CKTOTAL, CKMB, CKMBINDEX, TROPONINI in the last 168 hours.  BNP (last 3 results) No results for input(s): BNP in the last 8760 hours.  ProBNP (last 3 results) No results for input(s): PROBNP in the last 8760 hours.  CBG: Recent Labs  Lab 01/28/18 0852 01/28/18 1016  GLUCAP >600* >600*    Radiological Exams on Admission: No results found.  EKG: Independently  reviewed. Sinus tachy  Assessment/Plan Present on Admission: . DKA (diabetic ketoacidoses) ([place]) . DM (diabetes mellitus), type 1, uncontrolled ([place]) . Marijuana use  Principal Problem:   DKA (diabetic ketoacidoses) (H[facility]) Active Problems:   DM (diabetes mellitus), type 1, uncontrolled ([place])   Marijuana use  DKA in a type 1 diabetic  CBG of 789, VBG: PH of 7.17, bicarb of 19, anion gap 29 Afebrile, with mild leukocytosis UA with ketones CXR, unremarkable S/P IV 2L boluses Start insulin drip protocol BMET Q4H till anion gap is closed Hold home insulin regimen: aspart 10U TID, lantus 20U daily (may need to be adjusted) DM coordinator consulted Monitor in SDU  AKI Likely due to dehydration from above Cr on admission 1.42, baseline Cr WNL Continue IVF Daily BMET  Leukocytosis Likely reactive due to DKA Afebrile UA neg, CXR unremarkable GI panel pending (c/o loose stools)   Marijuana abuse Last use was last week, pt reports cutting down      DVT prophylaxis: Heparin  Code Status: Full  Family Communication: None at bedside  Disposition Plan: Home  Consults called: None  Admission status: Inpatient    Alma Friendly MD [facility]   If 7PM-7AM, please contact night-coverage [email or web address]   01/28/2018, 11:03 AM

## 2018-01-28 NOTE — ED Triage Notes (Signed)
Pt presents from work with c/o hyperglycemia. Pt reports he was vomiting all morning which he reports is how he knows his blood sugar is elevated. Pt's CBG was 560, hx of diabetes.

## 2018-01-29 DIAGNOSIS — N179 Acute kidney failure, unspecified: Secondary | ICD-10-CM

## 2018-01-29 DIAGNOSIS — E1010 Type 1 diabetes mellitus with ketoacidosis without coma: Principal | ICD-10-CM

## 2018-01-29 DIAGNOSIS — E1065 Type 1 diabetes mellitus with hyperglycemia: Secondary | ICD-10-CM

## 2018-01-29 LAB — CBC
HCT: 34.2 % — ABNORMAL LOW (ref 39.0–52.0)
HEMOGLOBIN: 11.4 g/dL — AB (ref 13.0–17.0)
MCH: 30.2 pg (ref 26.0–34.0)
MCHC: 33.3 g/dL (ref 30.0–36.0)
MCV: 90.7 fL (ref 78.0–100.0)
PLATELETS: 259 10*3/uL (ref 150–400)
RBC: 3.77 MIL/uL — AB (ref 4.22–5.81)
RDW: 13.3 % (ref 11.5–15.5)
WBC: 7.2 10*3/uL (ref 4.0–10.5)

## 2018-01-29 LAB — BASIC METABOLIC PANEL
ANION GAP: 10 (ref 5–15)
BUN: 18 mg/dL (ref 6–20)
CALCIUM: 8 mg/dL — AB (ref 8.9–10.3)
CHLORIDE: 109 mmol/L (ref 98–111)
CO2: 23 mmol/L (ref 22–32)
CREATININE: 0.71 mg/dL (ref 0.61–1.24)
GFR calc non Af Amer: >60 mL/min (ref >60)
Glucose, Bld: 85 mg/dL (ref 70–99)
Potassium: 3.5 mmol/L (ref 3.5–5.1)
SODIUM: 142 mmol/L (ref 135–145)

## 2018-01-29 LAB — GLUCOSE, CAPILLARY
GLUCOSE-CAPILLARY: 83 mg/dL (ref 70–99)
GLUCOSE-CAPILLARY: 56 mg/dL — AB (ref 70–99)
GLUCOSE-CAPILLARY: 62 mg/dL — AB (ref 70–99)
Glucose-Capillary: 75 mg/dL (ref 70–99)
Glucose-Capillary: 275 mg/dL — ABNORMAL HIGH (ref 70–99)

## 2018-01-29 MED ORDER — INSULIN GLARGINE 100 UNIT/ML ~~LOC~~ SOLN
15.0000 [IU] | Freq: Every day | SUBCUTANEOUS | Status: DC
  Filled 2018-01-29: qty 0.15

## 2018-01-29 MED ORDER — INSULIN ASPART 100 UNIT/ML ~~LOC~~ SOLN: 10.0000 [IU] | Freq: Three times a day (TID) | SUBCUTANEOUS | Status: DC

## 2018-01-29 MED ORDER — ENOXAPARIN SODIUM 40 MG/0.4ML ~~LOC~~ SOLN: 40.0000 mg | SUBCUTANEOUS | Status: DC

## 2018-01-29 NOTE — Progress Notes (Signed)
Hypoglycemic Event  CBG: 56 at 0749  Treatment: Orange juice and crackers Symptoms: None observed and pt denied any symptoms.  Follow-up CBG:  Time:0810 CBG Result: 62  Treatment: Breakfast  Symptoms: None observed and pt denied any symptoms.  Follow-up CBG: BXID:[id number]        CBG: 75   Comments/MD notified: Nettey, MD was present during initial CBG result. I will continue to monitor the pt.     Tilden Fossa

## 2018-01-29 NOTE — Progress Notes (Signed)
PROGRESS NOTE    [name] [name]  ZOX:[id number] DOB: 08-Oct-1985 DOA: 01/28/2018 PCP: Hoy Register, MD   Brief Narrative: [name] [name] is a 32 y.o. male with medical history significant for type 1 diabetes. Frequent DKA admissions. Currently with DKA admission. Transitioned to subcutaneous insulin. Hypoglycemia today.   Assessment & Plan:   Principal Problem:   DKA (diabetic ketoacidoses) ([facility]) Active Problems:   DM (diabetes mellitus), type 1, uncontrolled ([facility])   Marijuana use   AKI (acute kidney injury) ([facility])   Diabetic ketoacidosis Patient transitioned to subcutaneous insulin overnight. Given Lantus 10 units. Hypoglycemia this morning. Patient states that he sometimes forgets his nighttime doses of Lantus secondary to falling asleep which may have precipitated his episode (and prior episodes).  Diabetes mellitus, type 1 Uncontrolled. Hemoglobin A1C of 13.0% on 12/21/17. On Lantus 15 units BID at home, in addition to Novolog 10 units TID with meals. -Increase to Lantus 15 units this evening. If well controlled without hypoglycemia would add back morning dose of Lantus 15 units as well -Novolog 10 units TID with meals -Patient declined gastric emptying study  Acute kidney injury Creatinine of 1.42 on admission. Resolved with IV fluids.  Leukocytosis Mild on admission. Resolved.  Marijuana use   DVT prophylaxis: Lovenox Code Status:   Code Status: Full Code Family Communication: None at bedside Disposition Plan: Discharge likely tomorrow if no hypoglycemia   Consultants:   None  Procedures:   None  Antimicrobials:  None    Subjective: No issues overnight. Hypoglycemia today. Asymptomatic.  Objective: Vitals:   01/29/18 0000 01/29/18 0135 01/29/18 0414 01/29/18 0929  BP: 111/78 (!) 140/94 114/79 125/87  Pulse: 83 94 83 80  Resp: 12 13 15 14   Temp:  97.6 F (36.4 C) 98 F (36.7 C) 98.1 F (36.7 C)  TempSrc:  Oral Oral Oral  SpO2: 100% 96% 100%  100%  Weight:      Height:        Intake/Output Summary (Last 24 hours) at 01/29/2018 1158 Last data filed at 01/29/2018 1030 Gross per 24 hour  Intake 2997.03 ml  Output 1725 ml  Net 1272.03 ml   Filed Weights   01/28/18 1133  Weight: 48.4 kg    Examination:  General exam: Appears calm and comfortable Respiratory system: Clear to auscultation. Respiratory effort normal. Cardiovascular system: S1 & S2 heard, RRR. No murmurs, rubs, gallops or clicks. Gastrointestinal system: Abdomen is nondistended, soft and nontender. Normal bowel sounds heard. Central nervous system: Alert and oriented. No focal neurological deficits. Extremities: No edema. No calf tenderness Skin: No cyanosis. No rashes Psychiatry: Judgement and insight appear normal. Mood & affect appropriate.     Data Reviewed: I have personally reviewed following labs and imaging studies  CBC: Recent Labs  Lab 01/28/18 0922 01/29/18 0237  WBC 10.8* 7.2  NEUTROABS 9.6*  --   HGB 14.1 11.4*  HCT 42.6 34.2*  MCV 92.8 90.7  PLT 340 259   Basic Metabolic Panel: Recent Labs  Lab 01/28/18 1150 01/28/18 1425 01/28/18 1830 01/28/18 2231 01/29/18 0237  NA 143 145 147* 148* 142  K 4.2 5.2* 3.8 3.7 3.5  CL 106 114* 117* 115* 109  CO2 16* 17* 24 22 23   GLUCOSE 500* 259* 153* 90 85  BUN 27* 22* 19 19 18   CREATININE 1.06 0.92 0.66 0.60* 0.71  CALCIUM 8.8* 8.2* 8.4* 8.6* 8.0*   GFR: Estimated Creatinine Clearance: 89.1 mL/min (by C-G formula based on SCr of 0.71 mg/dL). Liver Function Tests:  No results for input(s): AST, ALT, ALKPHOS, BILITOT, PROT, ALBUMIN in the last 168 hours. No results for input(s): LIPASE, AMYLASE in the last 168 hours. No results for input(s): AMMONIA in the last 168 hours. Coagulation Profile: No results for input(s): INR, PROTIME in the last 168 hours. Cardiac Enzymes: No results for input(s): CKTOTAL, CKMB, CKMBINDEX, TROPONINI in the last 168 hours. BNP (last 3 results) No results  for input(s): PROBNP in the last 8760 hours. HbA1C: No results for input(s): HGBA1C in the last 72 hours. CBG: Recent Labs  Lab 01/29/18 0412 01/29/18 0748 01/29/18 0809 01/29/18 0829 01/29/18 1152  GLUCAP 83 56* 62* 75 275*   Lipid Profile: No results for input(s): CHOL, HDL, LDLCALC, TRIG, CHOLHDL, LDLDIRECT in the last 72 hours. Thyroid Function Tests: No results for input(s): TSH, T4TOTAL, FREET4, T3FREE, THYROIDAB in the last 72 hours. Anemia Panel: No results for input(s): VITAMINB12, FOLATE, FERRITIN, TIBC, IRON, RETICCTPCT in the last 72 hours. Sepsis Labs: No results for input(s): PROCALCITON, LATICACIDVEN in the last 168 hours.  Recent Results (from the past 240 hour(s))  MRSA PCR Screening     Status: None   Collection Time: 01/28/18 11:39 AM  Result Value Ref Range Status   MRSA by PCR NEGATIVE NEGATIVE Final    Comment:        The GeneXpert MRSA Assay (FDA approved for NASAL specimens only), is one component of a comprehensive MRSA colonization surveillance program. It is not intended to diagnose MRSA infection nor to guide or monitor treatment for MRSA infections. Performed at [facility], [address], [place], [place] [address]          Radiology Studies: Portable Chest X-ray (1 View)  Result Date: 01/28/2018 CLINICAL DATA:  Hyperglycemia nausea and vomiting this morning. Former smoker. EXAM: PORTABLE CHEST 1 VIEW COMPARISON:  PA and lateral chest x-ray of May 2nd 2019 FINDINGS: The lungs are mildly hyperinflated and clear. The heart and pulmonary vascularity are normal. The mediastinum is normal in width. The trachea is midline. The bony thorax exhibits no acute abnormality. IMPRESSION: There is no pneumonia, CHF, nor other acute cardiopulmonary abnormality. Electronically Signed   By: David  [place] M.D.   On: 01/28/2018 11:14        Scheduled Meds: . heparin  5,000 Units Subcutaneous Q8H  . insulin aspart  0-15 Units  Subcutaneous Q4H  . sodium chloride flush  3 mL Intravenous Q12H   Continuous Infusions: . sodium chloride 100 mL/hr at 01/29/18 1030     LOS: 1 day     Jacquelin Hawking, MD [facility] 01/29/2018, 11:58 AM Pager: [phone]  If 7PM-7AM, please contact night-coverage [email or web address] 01/29/2018, 11:58 AM

## 2018-01-29 NOTE — Progress Notes (Signed)
Pt reported that he wanted to leave AMA d/t his new job. Education was provided and the pt was encouraged to still stay; however, the pt stated that he still wanted to leave. Jacquelin Hawking, MD was notified. Pt left AMA at 1320.

## 2018-02-07 NOTE — Discharge Summary (Signed)
Short discharge summary  Patient left AMA. Patient admitted with DKA and was given IV insulin. He was transitioned to subcutaneous insulin but developed hypoglycemia. Plan was to adjust insulin regimen and watch overnight to ensure no continued hypoglycemia. Patient decided to leave AMA. Risks discussed.  Jacquelin Hawkingalph Bryndle Corredor, MD [facility] 02/07/2018, 3:20 PM

## 2018-03-05 ENCOUNTER — Ambulatory Visit: Payer: Self-pay | Admitting: Family Medicine

## 2018-03-09 ENCOUNTER — Emergency Department (HOSPITAL_COMMUNITY): Payer: Self-pay

## 2018-03-09 ENCOUNTER — Encounter (HOSPITAL_COMMUNITY): Payer: Self-pay

## 2018-03-09 ENCOUNTER — Other Ambulatory Visit: Payer: Self-pay

## 2018-03-09 ENCOUNTER — Emergency Department (HOSPITAL_COMMUNITY)
Admission: EM | Admit: 2018-03-09 | Discharge: 2018-03-09 | Disposition: A | Discharge status: Home / Self Care | Payer: Self-pay | Attending: Emergency Medicine | Admitting: Emergency Medicine

## 2018-03-09 DIAGNOSIS — E1010 Type 1 diabetes mellitus with ketoacidosis without coma: Secondary | ICD-10-CM | POA: Insufficient documentation

## 2018-03-09 DIAGNOSIS — R112 Nausea with vomiting, unspecified: Secondary | ICD-10-CM | POA: Insufficient documentation

## 2018-03-09 DIAGNOSIS — I10 Essential (primary) hypertension: Secondary | ICD-10-CM | POA: Insufficient documentation

## 2018-03-09 DIAGNOSIS — Z794 Long term (current) use of insulin: Secondary | ICD-10-CM | POA: Insufficient documentation

## 2018-03-09 DIAGNOSIS — Z79899 Other long term (current) drug therapy: Secondary | ICD-10-CM | POA: Insufficient documentation

## 2018-03-09 DIAGNOSIS — Z87891 Personal history of nicotine dependence: Secondary | ICD-10-CM | POA: Insufficient documentation

## 2018-03-09 LAB — URINALYSIS, ROUTINE W REFLEX MICROSCOPIC
BACTERIA UA: NONE SEEN
Bilirubin Urine: NEGATIVE
Glucose, UA: >=500 mg/dL — AB
Hgb urine dipstick: NEGATIVE
KETONES UR: 80 mg/dL — AB
LEUKOCYTES UA: NEGATIVE
NITRITE: NEGATIVE
PH: 6 (ref 5.0–8.0)
Protein, ur: 30 mg/dL — AB
Specific Gravity, Urine: 1.02 (ref 1.005–1.030)

## 2018-03-09 LAB — COMPREHENSIVE METABOLIC PANEL
ALT: 42 U/L (ref 0–44)
ANION GAP: 15 (ref 5–15)
AST: 34 U/L (ref 15–41)
Albumin: 4.1 g/dL (ref 3.5–5.0)
Alkaline Phosphatase: 90 U/L (ref 38–126)
BUN: 29 mg/dL — ABNORMAL HIGH (ref 6–20)
CO2: 25 mmol/L (ref 22–32)
Calcium: 8.9 mg/dL (ref 8.9–10.3)
Chloride: 94 mmol/L — ABNORMAL LOW (ref 98–111)
Creatinine, Ser: 0.9 mg/dL (ref 0.61–1.24)
Glucose, Bld: 213 mg/dL — ABNORMAL HIGH (ref 70–99)
POTASSIUM: 3.4 mmol/L — AB (ref 3.5–5.1)
Sodium: 134 mmol/L — ABNORMAL LOW (ref 135–145)
Total Bilirubin: 1.3 mg/dL — ABNORMAL HIGH (ref 0.3–1.2)
Total Protein: 7.8 g/dL (ref 6.5–8.1)

## 2018-03-09 LAB — CBC
HCT: 44.2 % (ref 39.0–52.0)
HEMOGLOBIN: 14.7 g/dL (ref 13.0–17.0)
MCH: 30.1 pg (ref 26.0–34.0)
MCHC: 33.3 g/dL (ref 30.0–36.0)
MCV: 90.4 fL (ref 80.0–100.0)
NRBC: 0 % (ref 0.0–0.2)
Platelets: 327 10*3/uL (ref 150–400)
RBC: 4.89 MIL/uL (ref 4.22–5.81)
RDW: 13.6 % (ref 11.5–15.5)
WBC: 10.3 10*3/uL (ref 4.0–10.5)

## 2018-03-09 LAB — CBG MONITORING, ED: GLUCOSE-CAPILLARY: 168 mg/dL — AB (ref 70–99)

## 2018-03-09 LAB — LIPASE, BLOOD: LIPASE: 35 U/L (ref 11–51)

## 2018-03-09 MED ORDER — FAMOTIDINE IN NACL 20-0.9 MG/50ML-% IV SOLN
20.0000 mg | Freq: Once | INTRAVENOUS | Status: AC
  Administered 2018-03-09: 20 mg via INTRAVENOUS
  Filled 2018-03-09: qty 50

## 2018-03-09 MED ORDER — SODIUM CHLORIDE 0.9 % IV BOLUS
1000.0000 mL | Freq: Once | INTRAVENOUS | Status: AC
  Administered 2018-03-09: 1000 mL via INTRAVENOUS

## 2018-03-09 MED ORDER — ONDANSETRON 4 MG PO TBDP: 4.0000 mg | ORAL_TABLET | Freq: Three times a day (TID) | ORAL | 0 refills | Status: DC | PRN

## 2018-03-09 MED ORDER — PROMETHAZINE HCL 25 MG/ML IJ SOLN
12.5000 mg | Freq: Once | INTRAMUSCULAR | Status: AC
  Administered 2018-03-09: 12.5 mg via INTRAVENOUS
  Filled 2018-03-09: qty 1

## 2018-03-09 MED ORDER — POTASSIUM CHLORIDE CRYS ER 20 MEQ PO TBCR
40.0000 meq | EXTENDED_RELEASE_TABLET | Freq: Once | ORAL | Status: AC
  Administered 2018-03-09: 40 meq via ORAL
  Filled 2018-03-09: qty 2

## 2018-03-09 NOTE — ED Notes (Signed)
Pt is attempting to provide urine sample. Pt is aware if unable to provide urine RN will conduct In and Out Cath.

## 2018-03-09 NOTE — ED Triage Notes (Signed)
Pt states that he has been feeling "terrible" and has had emesis. Pt reports 6-7 episodes, describing it as "liquid". Pt denies diarrhea. Pt was concerned that his sugar was high, but did not check it at home. 168 upon ED arrival.

## 2018-03-09 NOTE — ED Notes (Signed)
Pt. aware of urine specimen. Pt. Unable to urinate at this time. Will collect urine when pt. Voids. Nurse aware.

## 2018-03-09 NOTE — Discharge Instructions (Addendum)
Take the prescription as directed.  Increase your fluid intake (ie:  Gatoraide) for the next few days.  Eat a bland diet and advance to your regular diet slowly as you can tolerate it.  Call your regular medical doctor Monday to schedule a follow up appointment in the next 2 days.  Return to the Emergency Department immediately if not improving (or even worsening) despite taking the medicines as prescribed, any black or bloody stool or vomit, if you develop a fever over "101," or for any other concerns. ° °

## 2018-03-09 NOTE — ED Notes (Signed)
Patient ambulated 60 ft. Tolerated well.

## 2018-03-09 NOTE — ED Notes (Signed)
Pt given water 

## 2018-03-09 NOTE — ED Notes (Signed)
Patient was able to urinate 225 cc. Food was offered to the patient, he refused.

## 2018-03-09 NOTE — ED Provider Notes (Signed)
[facility] DEPT Provider Note   CSN: [id number] Arrival date & time: 03/09/18  0731     History   Chief Complaint Chief Complaint  Patient presents with  . Emesis    HPI [name] [name] is a 32 y.o. male.   Emesis      Pt was seen at 0750.  Per pt, c/o gradual onset and persistence of multiple intermittent episodes of N/V that began 2 to 3 days ago. Describes the emesis as "watery." Has been associated with generalized weakness. Denies abd pain, no diarrhea, no CP/SOB, no back pain, no fevers, no black or blood in stools or emesis.    Past Medical History:  Diagnosis Date  . ARF (acute renal failure) ([place]) 2011   pt was on dialysis "for 1 day approx 2011, pt is no longer on dialysis (03/09/2017)  . DKA (diabetic ketoacidoses) ([place])    "several times" (03/09/2017)  . Type I diabetes mellitus [facility]) Dx 2012    Patient Active Problem List   Diagnosis Date Noted  . Nausea & vomiting 11/29/2017  . Diabetes mellitus type 1, uncontrolled, with complications ([place])   . Noncompliance by refusing service   . DKA (diabetic ketoacidoses) ([place]) 06/29/2017  . DKA, type 1 ([place]) 03/09/2017  . Hypophosphatemia 08/27/2016  . AKI (acute kidney injury) ([place]) 07/11/2016  . Hyperkalemia 03/09/2016  . Leukocytosis 03/09/2016  . Diabetic ketoacidosis without coma associated with type 1 diabetes mellitus ([place])   . Non-intractable vomiting with nausea   . Chest pain 12/18/2015  . Marijuana use 07/22/2015  . Proteinuria with type 1 diabetes mellitus ([place]) 07/22/2015  . Hypertension complicating diabetes ([place]) 07/22/2015  . Uncontrolled type 1 diabetes mellitus with ketoacidosis without coma ([place])   . Hypocalcemia   . Acute renal failure ([place])   . Hypokalemia   . Hypomagnesemia   . Abnormal EKG 06/15/2015  . Sinus tachycardia   . Tachypnea   . Tinea pedis 12/17/2014  . Adjustment disorder with mixed anxiety and depressed mood 09/03/2014  . Protein-calorie  malnutrition, severe ([place]) 07/06/2014  . DM (diabetes mellitus), type 1, uncontrolled ([place]) 05/01/2014  . HLD (hyperlipidemia) 04/26/2014  . Substance abuse ([place])   . Dehydration 07/07/2013  . Fatigue 03/10/2012    Past Surgical History:  Procedure Laterality Date  . NO PAST SURGERIES          Home Medications    Prior to Admission medications   Medication Sig Start Date End Date Taking? Authorizing Provider  Blood Glucose Monitoring Suppl (TRUE METRIX METER) w/Device KIT 1 each by Does not apply route as needed. 07/12/16   Florencia Reasons, MD  feeding supplement, GLUCERNA SHAKE, (GLUCERNA SHAKE) LIQD Take 237 mLs by mouth 3 (three) times daily between meals. 01/03/18   Ledell Noss, MD  insulin aspart (NOVOLOG) 100 UNIT/ML injection Inject 10 Units into the skin 3 (three) times daily before meals. 01/03/18   Ledell Noss, MD  Insulin Glargine (LANTUS) 100 UNIT/ML Solostar Pen Inject 20 Units into the skin daily. 01/03/18   Ledell Noss, MD    Family History Family History  Problem Relation Age of Onset  . Diabetes Father   . Diabetes Maternal Grandmother   . Heart disease Neg Hx   . Hypertension Neg Hx     Social History Social History   Tobacco Use  . Smoking status: Former Smoker    Packs/day: 0.25    Years: 10.00    Pack years: 2.50    Types: Cigarettes  Last attempt to quit: 09/19/2013    Years since quitting: 4.4  . Smokeless tobacco: Never Used  Substance Use Topics  . Alcohol use: Yes    Alcohol/week: 0.0 standard drinks    Comment: 03/09/2017 "normally I don't drink; drank 3 shots liquor 2 days ago"  . Drug use: No     Allergies   Patient has no known allergies.   Review of Systems Review of Systems  Gastrointestinal: Positive for vomiting.  ROS: Statement: All systems negative except as marked or noted in the HPI; Constitutional: Negative for fever and chills. +generalized weakness. ; ; Eyes: Negative for eye pain, redness and discharge. ; ; ENMT: Negative for  ear pain, hoarseness, nasal congestion, sinus pressure and sore throat. ; ; Cardiovascular: Negative for chest pain, palpitations, diaphoresis, dyspnea and peripheral edema. ; ; Respiratory: Negative for cough, wheezing and stridor. ; ; Gastrointestinal: +N/V. Negative for diarrhea, abdominal pain, blood in stool, hematemesis, jaundice and rectal bleeding. . ; ; Genitourinary: Negative for dysuria, flank pain and hematuria. ; ; Musculoskeletal: Negative for back pain and neck pain. Negative for swelling and trauma.; ; Skin: Negative for pruritus, rash, abrasions, blisters, bruising and skin lesion.; ; Neuro: Negative for headache, lightheadedness and neck stiffness. Negative for altered level of consciousness, altered mental status, extremity weakness, paresthesias, involuntary movement, seizure and syncope.        Physical Exam Updated Vital Signs BP (!) 139/102 (BP Location: Left Arm)   Pulse (!) 113   Temp 98.3 F (36.8 C) (Oral)   Resp 18   Ht _0  (1.676 m)   Wt 49.9 kg   SpO2 100%   BMI 17.75 kg/m    BP (!) 148/98   Pulse 88   Temp 98.3 F (36.8 C) (Oral)   Resp 17   Ht _1  (1.676 m)   Wt 49.9 kg   SpO2 94%   BMI 17.75 kg/m    Physical Exam 0755: Physical examination:  Nursing notes reviewed; Vital signs and O2 SAT reviewed;  Constitutional: Well developed, Well nourished, Well hydrated, In no acute distress; Head:  Normocephalic, atraumatic; Eyes: EOMI, PERRL, No scleral icterus; ENMT: Mouth and pharynx normal, Mucous membranes moist; Neck: Supple, Full range of motion, No lymphadenopathy; Cardiovascular: Regular rate and rhythm, No gallop; Respiratory: Breath sounds clear & equal bilaterally, No wheezes.  Speaking full sentences with ease, Normal respiratory effort/excursion; Chest: Nontender, Movement normal; Abdomen: Soft, +dry heaving during exam. Nontender, Nondistended, Normal bowel sounds; Genitourinary: No CVA tenderness; Extremities: Peripheral pulses normal, No  tenderness, No edema, No calf edema or asymmetry.; Neuro: AA&Ox3, Major CN grossly intact.  Speech clear. No gross focal motor or sensory deficits in extremities.; Skin: Color normal, Warm, Dry.   ED Treatments / Results  Labs (all labs ordered are listed, but only abnormal results are displayed)   EKG None  Radiology   Procedures Procedures (including critical care time)  Medications Ordered in ED Medications  sodium chloride 0.9 % bolus 1,000 mL (has no administration in time range)  famotidine (PEPCID) IVPB 20 mg premix (has no administration in time range)  promethazine (PHENERGAN) injection 12.5 mg (has no administration in time range)     Initial Impression / Assessment and Plan / ED Course  I have reviewed the triage vital signs and the nursing notes.  Pertinent labs & imaging results that were available during my care of the patient were reviewed by me and considered in my medical decision making (see chart for  details).  MDM Reviewed: previous chart, vitals and nursing note Reviewed previous: labs Interpretation: labs and x-ray    Results for orders placed or performed during the hospital encounter of 03/09/18  Lipase, blood  Result Value Ref Range   Lipase 35 11 - 51 U/L  Comprehensive metabolic panel  Result Value Ref Range   Sodium 134 (L) 135 - 145 mmol/L   Potassium 3.4 (L) 3.5 - 5.1 mmol/L   Chloride 94 (L) 98 - 111 mmol/L   CO2 25 22 - 32 mmol/L   Glucose, Bld 213 (H) 70 - 99 mg/dL   BUN 29 (H) 6 - 20 mg/dL   Creatinine, Ser 0.90 0.61 - 1.24 mg/dL   Calcium 8.9 8.9 - 10.3 mg/dL   Total Protein 7.8 6.5 - 8.1 g/dL   Albumin 4.1 3.5 - 5.0 g/dL   AST 34 15 - 41 U/L   ALT 42 0 - 44 U/L   Alkaline Phosphatase 90 38 - 126 U/L   Total Bilirubin 1.3 (H) 0.3 - 1.2 mg/dL   GFR calc non Af Amer >60 >60 mL/min   GFR calc Af Amer >60 >60 mL/min   Anion gap 15 5 - 15  CBC  Result Value Ref Range   WBC 10.3 4.0 - 10.5 K/uL   RBC 4.89 4.22 - 5.81 MIL/uL     Hemoglobin 14.7 13.0 - 17.0 g/dL   HCT 44.2 39.0 - 52.0 %   MCV 90.4 80.0 - 100.0 fL   MCH 30.1 26.0 - 34.0 pg   MCHC 33.3 30.0 - 36.0 g/dL   RDW 13.6 11.5 - 15.5 %   Platelets 327 150 - 400 K/uL   nRBC 0.0 0.0 - 0.2 %  Urinalysis, Routine w reflex microscopic  Result Value Ref Range   Color, Urine YELLOW YELLOW   APPearance CLOUDY (A) CLEAR   Specific Gravity, Urine 1.020 1.005 - 1.030   pH 6.0 5.0 - 8.0   Glucose, UA >=500 (A) NEGATIVE mg/dL   Hgb urine dipstick NEGATIVE NEGATIVE   Bilirubin Urine NEGATIVE NEGATIVE   Ketones, ur 80 (A) NEGATIVE mg/dL   Protein, ur 30 (A) NEGATIVE mg/dL   Nitrite NEGATIVE NEGATIVE   Leukocytes, UA NEGATIVE NEGATIVE   RBC / HPF 0-5 0 - 5 RBC/hpf   WBC, UA 6-10 0 - 5 WBC/hpf   Bacteria, UA NONE SEEN NONE SEEN   Mucus PRESENT   CBG monitoring, ED  Result Value Ref Range   Glucose-Capillary 168 (H) 70 - 99 mg/dL   Dg Abd Acute W/chest Result Date: 03/09/2018 CLINICAL DATA:  Nausea and vomiting for 2 days. EXAM: DG ABDOMEN ACUTE W/ 1V CHEST COMPARISON:  One-view chest x-ray 01/28/2018 FINDINGS: Heart size is normal. The lungs are clear. There is no edema or effusion. The bowel gas pattern is normal. Axial skeleton is within normal limits. No radiopaque stones are present. IMPRESSION: Negative abdominal radiographs.  No acute cardiopulmonary disease. Electronically Signed   By: San Morelle M.D.   On: 03/09/2018 09:43    1610:  Potassium repleted PO. Pt has tol PO well while in the ED without N/V.  No stooling while in the ED. Pt has ambulated with steady gait, easy resps, NAD.  Abd remains benign, resps easy, VSS. Feels better and wants to go home now. CBG mildly elevated but not acidotic with normal AG. No clear UTI on Udip, WBC count normal, remains afebrile, and pt denies symptoms; UC pending. Tx symptomatically at this time.  Dx and testing d/w pt.  Questions answered.  Verb understanding, agreeable to d/c home with outpt  f/u.    Final Clinical Impressions(s) / ED Diagnoses   Final diagnoses:  None    ED Discharge Orders    None       Francine Graven, DO 03/13/18 1034

## 2018-03-09 NOTE — ED Notes (Signed)
Patient has tolerated PO liquids well.

## 2018-03-09 NOTE — ED Notes (Signed)
Pt still unable to void. Refuses catheter. MD notified. Pt given more water.

## 2018-03-11 LAB — URINE CULTURE: Culture: NO GROWTH

## 2018-04-15 ENCOUNTER — Inpatient Hospital Stay (HOSPITAL_COMMUNITY)
Admission: EM | Admit: 2018-04-15 | Discharge: 2018-04-17 | DRG: 637 | Discharge status: Against Medical Advice | Payer: Self-pay | Attending: Internal Medicine | Admitting: Internal Medicine

## 2018-04-15 ENCOUNTER — Other Ambulatory Visit: Payer: Self-pay

## 2018-04-15 ENCOUNTER — Encounter (HOSPITAL_COMMUNITY): Payer: Self-pay

## 2018-04-15 DIAGNOSIS — E1010 Type 1 diabetes mellitus with ketoacidosis without coma: Principal | ICD-10-CM | POA: Diagnosis present

## 2018-04-15 DIAGNOSIS — N179 Acute kidney failure, unspecified: Secondary | ICD-10-CM | POA: Diagnosis present

## 2018-04-15 DIAGNOSIS — E1065 Type 1 diabetes mellitus with hyperglycemia: Secondary | ICD-10-CM

## 2018-04-15 DIAGNOSIS — R945 Abnormal results of liver function studies: Secondary | ICD-10-CM | POA: Diagnosis present

## 2018-04-15 DIAGNOSIS — D649 Anemia, unspecified: Secondary | ICD-10-CM | POA: Diagnosis present

## 2018-04-15 DIAGNOSIS — E1110 Type 2 diabetes mellitus with ketoacidosis without coma: Secondary | ICD-10-CM | POA: Diagnosis present

## 2018-04-15 DIAGNOSIS — Z5329 Procedure and treatment not carried out because of patient's decision for other reasons: Secondary | ICD-10-CM | POA: Diagnosis present

## 2018-04-15 DIAGNOSIS — R64 Cachexia: Secondary | ICD-10-CM | POA: Diagnosis present

## 2018-04-15 DIAGNOSIS — E860 Dehydration: Secondary | ICD-10-CM | POA: Diagnosis present

## 2018-04-15 DIAGNOSIS — E43 Unspecified severe protein-calorie malnutrition: Secondary | ICD-10-CM | POA: Diagnosis present

## 2018-04-15 DIAGNOSIS — E876 Hypokalemia: Secondary | ICD-10-CM | POA: Diagnosis present

## 2018-04-15 DIAGNOSIS — Z87891 Personal history of nicotine dependence: Secondary | ICD-10-CM

## 2018-04-15 DIAGNOSIS — Z833 Family history of diabetes mellitus: Secondary | ICD-10-CM

## 2018-04-15 DIAGNOSIS — Z681 Body mass index (BMI) 19 or less, adult: Secondary | ICD-10-CM

## 2018-04-15 DIAGNOSIS — Z794 Long term (current) use of insulin: Secondary | ICD-10-CM

## 2018-04-15 DIAGNOSIS — E0590 Thyrotoxicosis, unspecified without thyrotoxic crisis or storm: Secondary | ICD-10-CM | POA: Diagnosis present

## 2018-04-15 DIAGNOSIS — E869 Volume depletion, unspecified: Secondary | ICD-10-CM

## 2018-04-15 LAB — BLOOD GAS, VENOUS
Acid-base deficit: 21.1 mmol/L — ABNORMAL HIGH (ref 0.0–2.0)
Bicarbonate: 7.2 mmol/L — ABNORMAL LOW (ref 20.0–28.0)
DRAWN BY: 331471
O2 Saturation: 90.3 %
PH VEN: 7.126 — AB (ref 7.250–7.430)
Patient temperature: 98.6
pCO2, Ven: 22.7 mmHg — ABNORMAL LOW (ref 44.0–60.0)
pO2, Ven: 76.7 mmHg — ABNORMAL HIGH (ref 32.0–45.0)

## 2018-04-15 LAB — URINALYSIS, ROUTINE W REFLEX MICROSCOPIC
BACTERIA UA: NONE SEEN
Bilirubin Urine: NEGATIVE
Glucose, UA: >=500 mg/dL — AB
Hgb urine dipstick: NEGATIVE
KETONES UR: 80 mg/dL — AB
LEUKOCYTES UA: NEGATIVE
Nitrite: NEGATIVE
PROTEIN: NEGATIVE mg/dL
Specific Gravity, Urine: 1.02 (ref 1.005–1.030)
pH: 5 (ref 5.0–8.0)

## 2018-04-15 LAB — GLUCOSE, CAPILLARY
Glucose-Capillary: 577 mg/dL (ref 70–99)
Glucose-Capillary: >600 mg/dL (ref 70–99)
Glucose-Capillary: 569 mg/dL (ref 70–99)

## 2018-04-15 LAB — CBC
HEMATOCRIT: 42.7 % (ref 39.0–52.0)
Hemoglobin: 13.1 g/dL (ref 13.0–17.0)
MCH: 30.7 pg (ref 26.0–34.0)
MCHC: 30.7 g/dL (ref 30.0–36.0)
MCV: 100 fL (ref 80.0–100.0)
NRBC: 0 % (ref 0.0–0.2)
Platelets: 312 10*3/uL (ref 150–400)
RBC: 4.27 MIL/uL (ref 4.22–5.81)
RDW: 13 % (ref 11.5–15.5)
WBC: 16.4 10*3/uL — ABNORMAL HIGH (ref 4.0–10.5)

## 2018-04-15 LAB — RENAL FUNCTION PANEL
Albumin: 4.1 g/dL (ref 3.5–5.0)
BUN: 25 mg/dL — ABNORMAL HIGH (ref 6–20)
CO2: <7 mmol/L — ABNORMAL LOW (ref 22–32)
Calcium: 8.8 mg/dL — ABNORMAL LOW (ref 8.9–10.3)
Chloride: 113 mmol/L — ABNORMAL HIGH (ref 98–111)
Creatinine, Ser: 1.29 mg/dL — ABNORMAL HIGH (ref 0.61–1.24)
GFR calc Af Amer: >60 mL/min (ref >60)
GFR calc non Af Amer: >60 mL/min (ref >60)
Glucose, Bld: 660 mg/dL (ref 70–99)
Phosphorus: 6.1 mg/dL — ABNORMAL HIGH (ref 2.5–4.6)
Potassium: 5.8 mmol/L — ABNORMAL HIGH (ref 3.5–5.1)
Sodium: 143 mmol/L (ref 135–145)

## 2018-04-15 LAB — BASIC METABOLIC PANEL
Anion gap: 29 — ABNORMAL HIGH (ref 5–15)
BUN: 25 mg/dL — ABNORMAL HIGH (ref 6–20)
CHLORIDE: 100 mmol/L (ref 98–111)
CO2: 8 mmol/L — AB (ref 22–32)
CREATININE: 1.15 mg/dL (ref 0.61–1.24)
Calcium: 9.2 mg/dL (ref 8.9–10.3)
GFR calc non Af Amer: >60 mL/min (ref >60)
Glucose, Bld: 713 mg/dL (ref 70–99)
Potassium: 4.3 mmol/L (ref 3.5–5.1)
Sodium: 137 mmol/L (ref 135–145)

## 2018-04-15 LAB — CBG MONITORING, ED: Glucose-Capillary: 560 mg/dL (ref 70–99)

## 2018-04-15 LAB — TRIGLYCERIDES: Triglycerides: 784 mg/dL — ABNORMAL HIGH (ref <150)

## 2018-04-15 LAB — PREALBUMIN: Prealbumin: 23.4 mg/dL (ref 18–38)

## 2018-04-15 LAB — MAGNESIUM: Magnesium: 2.1 mg/dL (ref 1.7–2.4)

## 2018-04-15 MED ORDER — ORAL CARE MOUTH RINSE: 15.0000 mL | Freq: Two times a day (BID) | OROMUCOSAL | Status: DC

## 2018-04-15 MED ORDER — CHLORHEXIDINE GLUCONATE 0.12 % MT SOLN
15.0000 mL | Freq: Two times a day (BID) | OROMUCOSAL | Status: DC
  Administered 2018-04-16: 15 mL via OROMUCOSAL
  Filled 2018-04-15 (×2): qty 15

## 2018-04-15 MED ORDER — SODIUM CHLORIDE 0.9 % IV SOLN: INTRAVENOUS | Status: DC

## 2018-04-15 MED ORDER — INSULIN REGULAR(HUMAN) IN NACL 100-0.9 UT/100ML-% IV SOLN: INTRAVENOUS | Status: DC

## 2018-04-15 MED ORDER — DEXTROSE-NACL 5-0.45 % IV SOLN: INTRAVENOUS | Status: DC

## 2018-04-15 MED ORDER — DEXTROSE-NACL 5-0.45 % IV SOLN
INTRAVENOUS | Status: DC
  Administered 2018-04-16: 02:00:00 via INTRAVENOUS

## 2018-04-15 MED ORDER — INSULIN REGULAR(HUMAN) IN NACL 100-0.9 UT/100ML-% IV SOLN
INTRAVENOUS | Status: DC
  Administered 2018-04-15: 5.4 [IU]/h via INTRAVENOUS
  Filled 2018-04-15: qty 100

## 2018-04-15 MED ORDER — POTASSIUM CHLORIDE 10 MEQ/100ML IV SOLN
10.0000 meq | INTRAVENOUS | Status: AC
  Administered 2018-04-15 (×2): 10 meq via INTRAVENOUS
  Filled 2018-04-15 (×2): qty 100

## 2018-04-15 MED ORDER — ENOXAPARIN SODIUM 40 MG/0.4ML ~~LOC~~ SOLN
40.0000 mg | SUBCUTANEOUS | Status: DC
  Administered 2018-04-15 – 2018-04-16 (×2): 40 mg via SUBCUTANEOUS
  Filled 2018-04-15 (×2): qty 0.4

## 2018-04-15 MED ORDER — SODIUM CHLORIDE 0.9 % IV SOLN
INTRAVENOUS | Status: DC
  Administered 2018-04-15 (×2): via INTRAVENOUS

## 2018-04-15 MED ORDER — SODIUM CHLORIDE 0.9 % IV BOLUS
2000.0000 mL | Freq: Once | INTRAVENOUS | Status: AC
  Administered 2018-04-15: 2000 mL via INTRAVENOUS

## 2018-04-15 MED ORDER — POTASSIUM CHLORIDE IN NACL 20-0.9 MEQ/L-% IV SOLN
INTRAVENOUS | Status: DC
  Administered 2018-04-15: 22:00:00 via INTRAVENOUS
  Filled 2018-04-15 (×2): qty 1000

## 2018-04-15 MED ORDER — SODIUM CHLORIDE 0.9 % IV SOLN
INTRAVENOUS | Status: AC
  Administered 2018-04-15: 21:00:00 via INTRAVENOUS

## 2018-04-15 MED ORDER — POTASSIUM CHLORIDE 10 MEQ/100ML IV SOLN
10.0000 meq | INTRAVENOUS | Status: AC
  Administered 2018-04-15: 10 meq via INTRAVENOUS
  Filled 2018-04-15: qty 100

## 2018-04-15 NOTE — ED Provider Notes (Signed)
[facility] DEPT Provider Note   CSN: [id number] Arrival date & time: 04/15/18  1554     History   Chief Complaint Chief Complaint  Patient presents with  . Hyperglycemia    HPI [name] [name] is a 32 y.o. male.  32 year old male with history of insulin pen diabetes presents with 48 hours of nausea and vomiting as well as hyperglycemia.  He endorses polyuria as well as polydipsia.  Denies any chest or abdominal discomfort.  No fever or chills.  Patient states compliance with his insulin.  Patient has been lethargic all day.  Called EMS and blood sugar was read as high.  Was given 4 mg of Zofran as well as 400 cc of fluid and transported here.     Past Medical History:  Diagnosis Date  . ARF (acute renal failure) ([place]) 2011   pt was on dialysis "for 1 day approx 2011, pt is no longer on dialysis (03/09/2017)  . DKA (diabetic ketoacidoses) ([place])    "several times" (03/09/2017)  . Type I diabetes mellitus [facility]) Dx 2012    Patient Active Problem List   Diagnosis Date Noted  . Nausea & vomiting 11/29/2017  . Diabetes mellitus type 1, uncontrolled, with complications ([place])   . Noncompliance by refusing service   . DKA (diabetic ketoacidoses) ([place]) 06/29/2017  . DKA, type 1 ([place]) 03/09/2017  . Hypophosphatemia 08/27/2016  . AKI (acute kidney injury) ([place]) 07/11/2016  . Hyperkalemia 03/09/2016  . Leukocytosis 03/09/2016  . Diabetic ketoacidosis without coma associated with type 1 diabetes mellitus ([place])   . Non-intractable vomiting with nausea   . Chest pain 12/18/2015  . Marijuana use 07/22/2015  . Proteinuria with type 1 diabetes mellitus ([place]) 07/22/2015  . Hypertension complicating diabetes ([place]) 07/22/2015  . Uncontrolled type 1 diabetes mellitus with ketoacidosis without coma ([place])   . Hypocalcemia   . Acute renal failure ([place])   . Hypokalemia   . Hypomagnesemia   . Abnormal EKG 06/15/2015  . Sinus tachycardia   . Tachypnea   .  Tinea pedis 12/17/2014  . Adjustment disorder with mixed anxiety and depressed mood 09/03/2014  . Protein-calorie malnutrition, severe ([place]) 07/06/2014  . DM (diabetes mellitus), type 1, uncontrolled ([place]) 05/01/2014  . HLD (hyperlipidemia) 04/26/2014  . Substance abuse ([place])   . Dehydration 07/07/2013  . Fatigue 03/10/2012    Past Surgical History:  Procedure Laterality Date  . NO PAST SURGERIES          Home Medications    Prior to Admission medications   Medication Sig Start Date End Date Taking? Authorizing Provider  insulin aspart (NOVOLOG) 100 UNIT/ML injection Inject 10 Units into the skin 3 (three) times daily before meals. 01/03/18  Yes Ledell Noss, MD  Insulin Glargine (LANTUS) 100 UNIT/ML Solostar Pen Inject 20 Units into the skin daily. 01/03/18  Yes Ledell Noss, MD  Blood Glucose Monitoring Suppl (TRUE METRIX METER) w/Device KIT 1 each by Does not apply route as needed. 07/12/16   Florencia Reasons, MD  feeding supplement, GLUCERNA SHAKE, (GLUCERNA SHAKE) LIQD Take 237 mLs by mouth 3 (three) times daily between meals. Patient not taking: Reported on 04/15/2018 01/03/18   Ledell Noss, MD  ondansetron (ZOFRAN ODT) 4 MG disintegrating tablet Take 1 tablet (4 mg total) by mouth every 8 (eight) hours as needed for nausea or vomiting. 03/09/18   Francine Graven, DO    Family History Family History  Problem Relation Age of Onset  . Diabetes Father   .  Diabetes Maternal Grandmother   . Heart disease Neg Hx   . Hypertension Neg Hx     Social History Social History   Tobacco Use  . Smoking status: Former Smoker    Packs/day: 0.25    Years: 10.00    Pack years: 2.50    Types: Cigarettes    Last attempt to quit: 09/19/2013    Years since quitting: 4.5  . Smokeless tobacco: Never Used  Substance Use Topics  . Alcohol use: Yes    Alcohol/week: 0.0 standard drinks    Comment: 03/09/2017 "normally I don't drink; drank 3 shots liquor 2 days ago"  . Drug use: No     Allergies     Patient has no known allergies.   Review of Systems Review of Systems  All other systems reviewed and are negative.    Physical Exam Updated Vital Signs BP 123/77 (BP Location: Left Arm)   Pulse (!) 123   Temp 98.8 F (37.1 C) (Oral)   Resp (!) 22   SpO2 100%   Physical Exam  Constitutional: He is oriented to person, place, and time. He appears well-developed and well-nourished.  Non-toxic appearance. No distress.  HENT:  Head: Normocephalic and atraumatic.  Eyes: Pupils are equal, round, and reactive to light. Conjunctivae, EOM and lids are normal.  Neck: Normal range of motion. Neck supple. No tracheal deviation present. No thyroid mass present.  Cardiovascular: Regular rhythm and normal heart sounds. Tachycardia present. Exam reveals no gallop.  No murmur heard. Pulmonary/Chest: Effort normal and breath sounds normal. No stridor. No respiratory distress. He has no decreased breath sounds. He has no wheezes. He has no rhonchi. He has no rales.  Abdominal: Soft. Normal appearance and bowel sounds are normal. He exhibits no distension. There is no tenderness. There is no rebound and no CVA tenderness.  Musculoskeletal: Normal range of motion. He exhibits no edema or tenderness.  Neurological: He is alert and oriented to person, place, and time. He has normal strength. No cranial nerve deficit or sensory deficit. GCS eye subscore is 4. GCS verbal subscore is 5. GCS motor subscore is 6.  Skin: Skin is warm and dry. No abrasion and no rash noted.  Psychiatric: He has a normal mood and affect. His speech is normal and behavior is normal.  Nursing note and vitals reviewed.    ED Treatments / Results  Labs (all labs ordered are listed, but only abnormal results are displayed) Labs Reviewed  BASIC METABOLIC PANEL - Abnormal; Notable for the following components:      Result Value   CO2 8 (*)    Glucose, Bld 713 (*)    BUN 25 (*)    Anion gap 29 (*)    All other components  within normal limits  CBC - Abnormal; Notable for the following components:   WBC 16.4 (*)    All other components within normal limits  CBG MONITORING, ED - Abnormal; Notable for the following components:   Glucose-Capillary >600 (*)    All other components within normal limits  URINALYSIS, ROUTINE W REFLEX MICROSCOPIC  BLOOD GAS, VENOUS  I-STAT CHEM 8, ED    EKG None  Radiology No results found.  Procedures Procedures (including critical care time)  Medications Ordered in ED Medications  sodium chloride 0.9 % bolus 2,000 mL (2,000 mLs Intravenous New Bag/Given 04/15/18 1718)  0.9 %  sodium chloride infusion (has no administration in time range)  insulin regular, human (MYXREDLIN) 100 units/ 100 mL  infusion (has no administration in time range)  potassium chloride 10 mEq in 100 mL IVPB (has no administration in time range)  dextrose 5 %-0.45 % sodium chloride infusion (has no administration in time range)     Initial Impression / Assessment and Plan / ED Course  I have reviewed the triage vital signs and the nursing notes.  Pertinent labs & imaging results that were available during my care of the patient were reviewed by me and considered in my medical decision making (see chart for details).     Patient given IV hydration here due to hyperglycemia.  Patient's venous blood gas consistent with metabolic acidosis from his diabetic ketoacidosis.  Started on insulin drip.  Has evidence of DKA and will be admitted to the hospitalist service  CRITICAL CARE Performed by: Leota Jacobsen Total critical care time: 50 minutes Critical care time was exclusive of separately billable procedures and treating other patients. Critical care was necessary to treat or prevent imminent or life-threatening deterioration. Critical care was time spent personally by me on the following activities: development of treatment plan with patient and/or surrogate as well as nursing, discussions with  consultants, evaluation of patient's response to treatment, examination of patient, obtaining history from patient or surrogate, ordering and performing treatments and interventions, ordering and review of laboratory studies, ordering and review of radiographic studies, pulse oximetry and re-evaluation of patient's condition.   Final Clinical Impressions(s) / ED Diagnoses   Final diagnoses:  None    ED Discharge Orders    None       Lacretia Leigh, MD 04/20/18 [phone]

## 2018-04-15 NOTE — ED Notes (Signed)
ED TO INPATIENT HANDOFF REPORT  Name/Age/Gender [name] [name] 32 y.o. male  Code Status Code Status History    Date Active Date Inactive Code Status Order ID Comments User Context   01/28/2018 1138 01/29/2018 1614 Full Code [id number]  Alma Friendly, MD Inpatient   01/02/2018 1207 01/03/2018 1912 Full Code [id number]  Alphonzo Grieve, MD ED   11/30/2017 0052 12/01/2017 1346 Full Code [id number]  Rise Patience, MD ED   09/20/2017 1144 09/22/2017 1433 Full Code [id number]  Lady Deutscher, MD ED   08/21/2017 1146 08/23/2017 1849 Full Code [id number]  Georgette Shell, MD ED   06/29/2017 1331 07/01/2017 1918 Full Code [id number]  Purohit, Konrad Dolores, MD ED   03/09/2017 1158 03/12/2017 1552 Full Code [id number]  Reyne Dumas, MD ED   10/29/2016 2127 10/30/2016 1701 Full Code [id number]  Karmen Bongo, MD Inpatient   08/26/2016 1441 08/28/2016 1439 Full Code [id number]  Truett Mainland, DO Inpatient   07/11/2016 2138 07/12/2016 1951 Full Code [id number]  Lily Kocher, MD Inpatient   07/11/2016 2138 07/11/2016 2138 Full Code [id number]  Lily Kocher, MD Inpatient   05/29/2016 2319 05/31/2016 1759 Full Code [id number]  Toy Baker, MD Inpatient   03/09/2016 1135 03/11/2016 1625 Full Code [id number]  Debbe Odea, MD ED   12/18/2015 1217 12/19/2015 1918 Full Code [id number]  Mariel Aloe, MD Inpatient   12/01/2015 2310 12/03/2015 2130 Full Code [id number]  Rise Patience, MD ED   07/22/2015 0927 07/24/2015 1812 Full Code [id number]  Juluis Mire, MD ED   06/15/2015 2217 06/18/2015 1314 Full Code [id number]  Reubin Milan, MD Inpatient   06/06/2015 0200 06/07/2015 2026 Full Code [id number]  Vianne Bulls, MD ED   03/22/2015 1118 03/24/2015 1701 Full Code [id number]  Juluis Mire, MD ED   09/30/2014 0701 10/02/2014 1746 Full Code [id number]  Juanito Doom, MD Inpatient   08/30/2014 1521 09/03/2014 1708 Full Code [id number]  Blain Pais, MD ED   08/13/2014 1956 08/15/2014 1806 Full Code [id number]   Bonnielee Haff, MD ED   07/17/2014 0116 07/20/2014 1604 Full Code [id number]  Allyne Gee, MD Inpatient   07/05/2014 1040 07/08/2014 1533 Full Code [id number]  Marijean Heath, NP ED   07/01/2014 0010 07/02/2014 1404 Full Code [id number]  Allyne Gee, MD Inpatient   04/25/2014 0835 04/28/2014 2113 Full Code [id number]  Allie Bossier, MD Inpatient   04/24/2014 0618 04/25/2014 0835 Full Code [id number]  Rise Patience, MD Inpatient   07/07/2013 2218 07/10/2013 1738 Full Code [id number]  Modena Jansky, MD Inpatient   03/11/2012 0046 03/22/2012 2111 Full Code [id number]  Sharlotte Alamo, RN Inpatient   03/10/2012 2009 03/11/2012 0046 Full Code [id number]  Derrill Kay, MD ED      Home/SNF/Other Home  Chief Complaint DKA  Level of Care/Admitting Diagnosis ED Disposition    ED Disposition Condition [place]: [facility] [[id number]]  Level of Care: ICU [6]  Diagnosis: DKA (diabetic ketoacidoses) [facility]) [[id number]]  Admitting Physician: Bonnell Public [[id number]]  Attending Physician: Dana Allan I [[id number]]  Estimated length of stay: past midnight tomorrow  Certification:: I certify this patient will need inpatient services for at least 2 midnights  PT Class (Do Not Modify): Inpatient [101]  PT Acc Code (Do Not Modify): Private [1]       Medical History Past Medical History:  Diagnosis Date  . ARF (acute renal failure) ([place]) 2011   pt  was on dialysis "for 1 day approx 2011, pt is no longer on dialysis (03/09/2017)  . DKA (diabetic ketoacidoses) ([place])    "several times" (03/09/2017)  . Type I diabetes mellitus ([place]) Dx 2012    Allergies No Known Allergies  IV Location/Drains/Wounds Patient Lines/Drains/Airways Status   Active Line/Drains/Airways    Name:   Placement date:   Placement time:   Site:   Days:   Peripheral IV 04/15/18 Left Antecubital   04/15/18    1638    Antecubital   less than 1          Labs/Imaging Results  for orders placed or performed during the hospital encounter of 04/15/18 (from the past 48 hour(s))  Urinalysis, Routine w reflex microscopic     Status: Abnormal   Collection Time: 04/15/18  4:03 PM  Result Value Ref Range   Color, Urine STRAW (A) YELLOW   APPearance CLEAR CLEAR   Specific Gravity, Urine 1.020 1.005 - 1.030   pH 5.0 5.0 - 8.0   Glucose, UA >=500 (A) NEGATIVE mg/dL   Hgb urine dipstick NEGATIVE NEGATIVE   Bilirubin Urine NEGATIVE NEGATIVE   Ketones, ur 80 (A) NEGATIVE mg/dL   Protein, ur NEGATIVE NEGATIVE mg/dL   Nitrite NEGATIVE NEGATIVE   Leukocytes, UA NEGATIVE NEGATIVE   RBC / HPF 0-5 0 - 5 RBC/hpf   WBC, UA 0-5 0 - 5 WBC/hpf   Bacteria, UA NONE SEEN NONE SEEN   Mucus PRESENT     Comment: Performed at [facility], [place] [address], [place], [place] [address]  CBG monitoring, ED     Status: Abnormal   Collection Time: 04/15/18  4:17 PM  Result Value Ref Range   Glucose-Capillary >600 (HH) 70 - 99 mg/dL  Basic metabolic panel     Status: Abnormal   Collection Time: 04/15/18  4:25 PM  Result Value Ref Range   Sodium 137 135 - 145 mmol/L   Potassium 4.3 3.5 - 5.1 mmol/L   Chloride 100 98 - 111 mmol/L   CO2 8 (L) 22 - 32 mmol/L   Glucose, Bld 713 (HH) 70 - 99 mg/dL    Comment: CRITICAL RESULT CALLED TO, READ BACK BY AND VERIFIED WITH: BRIAN JESSEE,RN [id number] @ 1713 BY J SCOTTON    BUN 25 (H) 6 - 20 mg/dL   Creatinine, Ser 1.15 0.61 - 1.24 mg/dL   Calcium 9.2 8.9 - 10.3 mg/dL   GFR calc non Af Amer >60 >60 mL/min   GFR calc Af Amer >60 >60 mL/min    Comment: (NOTE) The eGFR has been calculated using the CKD EPI equation. This calculation has not been validated in all clinical situations. eGFR's persistently <60 mL/min signify possible Chronic Kidney Disease.    Anion gap 29 (H) 5 - 15    Comment: RESULT CHECKED Performed at [facility], [place] [address], [place], [place] [address]   CBC     Status: Abnormal    Collection Time: 04/15/18  4:25 PM  Result Value Ref Range   WBC 16.4 (H) 4.0 - 10.5 K/uL   RBC 4.27 4.22 - 5.81 MIL/uL   Hemoglobin 13.1 13.0 - 17.0 g/dL   HCT 42.7 39.0 - 52.0 %   MCV 100.0 80.0 - 100.0 fL   MCH 30.7 26.0 - 34.0 pg   MCHC 30.7 30.0 - 36.0 g/dL   RDW 13.0 11.5 - 15.5 %   Platelets 312 150 - 400 K/uL   nRBC 0.0  0.0 - 0.2 %    Comment: Performed at [facility], [place] [address], [place], [place] [address]  Blood gas, venous     Status: Abnormal   Collection Time: 04/15/18  5:20 PM  Result Value Ref Range   pH, Ven 7.126 (LL) 7.250 - 7.430    Comment: RBV DR ALLEN,MD BY LISA CRADDOCK,RRT,RCP ON 04/15/18 AT 1727   pCO2, Ven 22.7 (L) 44.0 - 60.0 mmHg   pO2, Ven 76.7 (H) 32.0 - 45.0 mmHg   Bicarbonate 7.2 (L) 20.0 - 28.0 mmol/L   Acid-base deficit 21.1 (H) 0.0 - 2.0 mmol/L   O2 Saturation 90.3 %   Patient temperature 98.6    Collection site VEIN    Drawn by [id number]    Sample type VEIN     Comment: Performed at [facility], [place] [address] [place], [place] [address]  CBG monitoring, ED     Status: Abnormal   Collection Time: 04/15/18  7:54 PM  Result Value Ref Range   Glucose-Capillary 560 (HH) 70 - 99 mg/dL   No results found.  Pending Labs [employer] (From admission, onward)    Start     Ordered   Signed and Corporate treasurer  STAT Now then every 4 hours ,   STAT     Signed and Held   Signed and Held  CBC  (enoxaparin (LOVENOX)    CrCl >/= 30 ml/min)  Once,   R    Comments:  Baseline for enoxaparin therapy IF NOT ALREADY DRAWN.  Notify MD if PLT < 100 K.    Signed and Held   Signed and Held  Creatinine, serum  (enoxaparin (LOVENOX)    CrCl >/= 30 ml/min)  Once,   R    Comments:  Baseline for enoxaparin therapy IF NOT ALREADY DRAWN.    Signed and Held   Signed and Held  Creatinine, serum  (enoxaparin (LOVENOX)    CrCl >/= 30 ml/min)  Weekly,   R    Comments:  while on enoxaparin therapy    Signed and  Held   Signed and Held  Renal function panel  Now then every 4 hours,   R     Signed and Held   Visual merchandiser and Held  Magnesium  Now then every 4 hours,   R     Signed and Held   Visual merchandiser and Occupational hygienist morning,   R     Signed and Held   Signed and Held  Comprehensive metabolic panel  Tomorrow morning,   R     Signed and Held   Signed and Held  CBC  (enoxaparin (LOVENOX)    CrCl >/= 30 ml/min)  Once,   R    Comments:  Baseline for enoxaparin therapy IF NOT ALREADY DRAWN.  Notify MD if PLT < 100 K.    Signed and Held   Signed and Held  Creatinine, serum  (enoxaparin (LOVENOX)    CrCl >/= 30 ml/min)  Once,   R    Comments:  Baseline for enoxaparin therapy IF NOT ALREADY DRAWN.    Signed and Held   Signed and Held  Creatinine, serum  (enoxaparin (LOVENOX)    CrCl >/= 30 ml/min)  Weekly,   R    Comments:  while on enoxaparin therapy    Signed and Held          Vitals/Pain Today's Vitals   04/15/18  1610 04/15/18 2023  BP: 123/77 128/74  Pulse: (!) 123 (!) 148  Resp: (!) 22 18  Temp: 98.8 F (37.1 C)   TempSrc: Oral   SpO2: 100% 100%    Isolation Precautions No active isolations  Medications Medications  0.9 %  sodium chloride infusion ( Intravenous New Bag/Given 04/15/18 1837)  insulin regular, human (MYXREDLIN) 100 units/ 100 mL infusion (5.4 Units/hr Intravenous New Bag/Given 04/15/18 1844)  potassium chloride 10 mEq in 100 mL IVPB (0 mEq Intravenous Stopped 04/15/18 2016)  dextrose 5 %-0.45 % sodium chloride infusion ( Intravenous Hold 04/15/18 1805)  sodium chloride 0.9 % bolus 2,000 mL (0 mLs Intravenous Stopped 04/15/18 1837)    Mobility walks

## 2018-04-15 NOTE — ED Notes (Signed)
Patient attempting to provide urine sample  

## 2018-04-15 NOTE — ED Notes (Signed)
Bed: WA08 Expected date:  Expected time:  Means of arrival:  Comments: EMS DKA?

## 2018-04-15 NOTE — ED Notes (Signed)
Date and time results received: 04/15/18 1714 (use smartphrase ".now" to insert current time)  Test: glucose Critical Value: 713  Name of Provider Notified: Dr Lorre NickAnthony Allen  Orders Received? Or Actions Taken?: Actions Taken: notified Dr Ritta SlotAnothony Allen

## 2018-04-15 NOTE — ED Notes (Signed)
Report given to Jon GillsAlexis, RN ICU

## 2018-04-15 NOTE — ED Notes (Signed)
When RN went in to start potassium, a foul odor was in the room. Pt had soiled himself and the bed and defecated in an emesis bag. RN attempted to clean room and assist patient in cleaning himself. Pt is alert, capable of using the restroom and capable of ambulation. RN instructed patient to use the toilet for defecation.

## 2018-04-15 NOTE — ED Triage Notes (Signed)
Per EMS: Pt is coming from work. Pt reports he woke up feeling lethargic with N/V all day. Pt reportedly tried to go to work and felt terrible. Read 'HIGH' for EMS. Pt is alert and oriented x4 and ambulatory.  Pt has had 400mg  of fluid and 4 mg of zofran. No IV access at this time due to loss of IV after administration.  BP 130/80 HR 106 SPO2 96% on RA

## 2018-04-15 NOTE — H&P (Addendum)
History and Physical  [name] [name] QIH:[id number] DOB: Mar 24, 1986 DOA: 04/15/2018  Referring physician: ER physician, Dr. Frances Nickels PCP: Charlott Rakes, MD  Outpatient Specialists:    Patient coming from: Home  Chief Complaint: Elevated blood sugar/DKA.  HPI: Patient is a 32 year old African-American male, cachectic, with past medical history significant for type 1 diabetes mellitus, diabetes ketoacidosis and acute kidney injury.  Patient presents with elevated blood sugar (713), and another episode of DKA. Patient is a poor historian.  The only history I could get from the patient was that he developed nausea and vomiting about 2 days ago.  No fever or chills, no headache, no neck pain, no urinary symptoms.  On presentation to the ER, the blood sugar was noted to be 713, anion gap of 29, CO2 of 8 creatinine of 1.15, potassium of 4.3 and ABG revealing pH of 7.126, PCO2 of 22.7 and oxygen of 76.7.  Patient will be admitted for further assessment and management.  ED Course: Patient has been volume resuscitated.  Patient is currently on insulin drip. Pertinent labs: CBC reveals WBC of 16.4, hemoglobin of 13.1, hematocrit of 40.7, MCV of 100 with platelet count of 312. EKG: Independently reviewed.   Review of Systems:  Negative for fever, visual changes, sore throat, rash, new muscle aches, chest pain, SOB, dysuria, bleeding.  Past Medical History:  Diagnosis Date  . ARF (acute renal failure) ([place]) 2011   pt was on dialysis "for 1 day approx 2011, pt is no longer on dialysis (03/09/2017)  . DKA (diabetic ketoacidoses) ([place])    "several times" (03/09/2017)  . Type I diabetes mellitus ([place]) Dx 2012    Past Surgical History:  Procedure Laterality Date  . NO PAST SURGERIES       reports that he quit smoking about 4 years ago. His smoking use included cigarettes. He has a 2.50 pack-year smoking history. He has never used smokeless tobacco. He reports that he drinks alcohol. He reports that  he does not use drugs.  No Known Allergies  Family History  Problem Relation Age of Onset  . Diabetes Father   . Diabetes Maternal Grandmother   . Heart disease Neg Hx   . Hypertension Neg Hx      Prior to Admission medications   Medication Sig Start Date End Date Taking? Authorizing Provider  insulin aspart (NOVOLOG) 100 UNIT/ML injection Inject 10 Units into the skin 3 (three) times daily before meals. 01/03/18  Yes Ledell Noss, MD  Insulin Glargine (LANTUS) 100 UNIT/ML Solostar Pen Inject 20 Units into the skin daily. 01/03/18  Yes Ledell Noss, MD  Blood Glucose Monitoring Suppl (TRUE METRIX METER) w/Device KIT 1 each by Does not apply route as needed. 07/12/16   Florencia Reasons, MD  ondansetron (ZOFRAN ODT) 4 MG disintegrating tablet Take 1 tablet (4 mg total) by mouth every 8 (eight) hours as needed for nausea or vomiting. 03/09/18   Francine Graven, DO    Physical Exam: Vitals:   04/15/18 1610  BP: 123/77  Pulse: (!) 123  Resp: (!) 22  Temp: 98.8 F (37.1 C)  TempSrc: Oral  SpO2: 100%    Constitutional:  . Patient is cachectic.   Eyes:  . No pallor. No jaundice.  ENMT:  . external ears, nose appear normal Neck:  . Neck is supple. No JVD Respiratory:  . CTA bilaterally, no w/r/r.  . Respiratory effort normal. No retractions or accessory muscle use Cardiovascular:  . S1S2 . No LE extremity edema   Abdomen:  .  Abdomen is soft and non tender. Organs are difficult to assess. Neurologic:  . Awake and alert. . Moves all limbs.  Wt Readings from Last 3 Encounters:  03/09/18 49.9 kg  01/28/18 48.4 kg  01/03/18 46.6 kg    I have personally reviewed following labs and imaging studies  Labs on Admission:  CBC: Recent Labs  Lab 04/15/18 1625  WBC 16.4*  HGB 13.1  HCT 42.7  MCV 100.0  PLT 443   Basic Metabolic Panel: Recent Labs  Lab 04/15/18 1625  NA 137  K 4.3  CL 100  CO2 8*  GLUCOSE 713*  BUN 25*  CREATININE 1.15  CALCIUM 9.2   Liver Function  Tests: No results for input(s): AST, ALT, ALKPHOS, BILITOT, PROT, ALBUMIN in the last 168 hours. No results for input(s): LIPASE, AMYLASE in the last 168 hours. No results for input(s): AMMONIA in the last 168 hours. Coagulation Profile: No results for input(s): INR, PROTIME in the last 168 hours. Cardiac Enzymes: No results for input(s): CKTOTAL, CKMB, CKMBINDEX, TROPONINI in the last 168 hours. BNP (last 3 results) No results for input(s): PROBNP in the last 8760 hours. HbA1C: No results for input(s): HGBA1C in the last 72 hours. CBG: Recent Labs  Lab 04/15/18 1617  GLUCAP >600*   Lipid Profile: No results for input(s): CHOL, HDL, LDLCALC, TRIG, CHOLHDL, LDLDIRECT in the last 72 hours. Thyroid Function Tests: No results for input(s): TSH, T4TOTAL, FREET4, T3FREE, THYROIDAB in the last 72 hours. Anemia Panel: No results for input(s): VITAMINB12, FOLATE, FERRITIN, TIBC, IRON, RETICCTPCT in the last 72 hours. Urine analysis:    Component Value Date/Time   COLORURINE YELLOW 03/09/2018 1434   APPEARANCEUR CLOUDY (A) 03/09/2018 1434   LABSPEC 1.020 03/09/2018 1434   PHURINE 6.0 03/09/2018 1434   GLUCOSEU >=500 (A) 03/09/2018 1434   HGBUR NEGATIVE 03/09/2018 1434   BILIRUBINUR NEGATIVE 03/09/2018 1434   BILIRUBINUR Negative 02/07/2016 1528   KETONESUR 80 (A) 03/09/2018 1434   PROTEINUR 30 (A) 03/09/2018 1434   UROBILINOGEN 0.2 02/07/2016 1528   UROBILINOGEN 0.2 03/22/2015 0913   NITRITE NEGATIVE 03/09/2018 1434   LEUKOCYTESUR NEGATIVE 03/09/2018 1434   Sepsis Labs: '@LABRCNTIP'$ (procalcitonin:4,lacticidven:4) )No results found for this or any previous visit (from the past 240 hour(s)).    Radiological Exams on Admission: No results found.  EKG: Independently reviewed.   Active Problems:   DKA (diabetic ketoacidoses) ([place])   Assessment/Plan DKA: Admit patient for further assessment and management Use the DKA protocol Monitor electrolytes closely Aggressive  hydration Insulin drip Transition to phase 2 DKA protocol after anion gap corrects Further management depend on hospital course.  Cachexia: Could be secondary to poorly controlled diabetes mellitus Check TSH Check albumin and prealbumin Consider dietitian consult in the morning Check HIV status if not already done. Further management depend on hospital course.  Volume depletion: Continue aggressive hydration. Monitor renal function and electrolytes closely.  DVT prophylaxis: Subcu Lovenox Code Status: Full code Family Communication:  Disposition Plan: Eventually Consults called: None Admission status: Inpatient  Patient is critically ill.  Patient is quite cachectic, and may have been ill chronically.  Patient presents with DKA, CO2 is only 8.  Patient's condition is such that patient will need to be managed on an inpatient basis.  Patient is at a high risk of deterioration, and possible death if not managed on an inpatient basis  Time spent: 14 minutes  Dana Allan, MD  [facility] Pager #: [phone] 7PM-7AM contact night coverage as  above  04/15/2018, 5:56 PM

## 2018-04-16 ENCOUNTER — Encounter (HOSPITAL_COMMUNITY): Payer: Self-pay

## 2018-04-16 DIAGNOSIS — E876 Hypokalemia: Secondary | ICD-10-CM

## 2018-04-16 DIAGNOSIS — E872 Acidosis: Secondary | ICD-10-CM

## 2018-04-16 LAB — BASIC METABOLIC PANEL
Anion gap: 9 (ref 5–15)
Anion gap: 6 (ref 5–15)
BUN: 16 mg/dL (ref 6–20)
BUN: 14 mg/dL (ref 6–20)
CO2: 16 mmol/L — ABNORMAL LOW (ref 22–32)
CO2: 21 mmol/L — ABNORMAL LOW (ref 22–32)
Calcium: 8.3 mg/dL — ABNORMAL LOW (ref 8.9–10.3)
Calcium: 7.7 mg/dL — ABNORMAL LOW (ref 8.9–10.3)
Chloride: 122 mmol/L — ABNORMAL HIGH (ref 98–111)
Chloride: 117 mmol/L — ABNORMAL HIGH (ref 98–111)
Creatinine, Ser: 0.61 mg/dL (ref 0.61–1.24)
Creatinine, Ser: 0.63 mg/dL (ref 0.61–1.24)
GFR calc Af Amer: >60 mL/min (ref >60)
GFR calc Af Amer: >60 mL/min (ref >60)
GFR calc non Af Amer: >60 mL/min (ref >60)
GFR calc non Af Amer: >60 mL/min (ref >60)
Glucose, Bld: 136 mg/dL — ABNORMAL HIGH (ref 70–99)
Glucose, Bld: 95 mg/dL (ref 70–99)
Potassium: 3.4 mmol/L — ABNORMAL LOW (ref 3.5–5.1)
Potassium: 3.1 mmol/L — ABNORMAL LOW (ref 3.5–5.1)
Sodium: 147 mmol/L — ABNORMAL HIGH (ref 135–145)
Sodium: 144 mmol/L (ref 135–145)

## 2018-04-16 LAB — GLUCOSE, CAPILLARY
Glucose-Capillary: 120 mg/dL — ABNORMAL HIGH (ref 70–99)
Glucose-Capillary: 125 mg/dL — ABNORMAL HIGH (ref 70–99)
Glucose-Capillary: 127 mg/dL — ABNORMAL HIGH (ref 70–99)
Glucose-Capillary: 128 mg/dL — ABNORMAL HIGH (ref 70–99)
Glucose-Capillary: 131 mg/dL — ABNORMAL HIGH (ref 70–99)
Glucose-Capillary: 134 mg/dL — ABNORMAL HIGH (ref 70–99)
Glucose-Capillary: 140 mg/dL — ABNORMAL HIGH (ref 70–99)
Glucose-Capillary: 145 mg/dL — ABNORMAL HIGH (ref 70–99)
Glucose-Capillary: 151 mg/dL — ABNORMAL HIGH (ref 70–99)
Glucose-Capillary: 179 mg/dL — ABNORMAL HIGH (ref 70–99)
Glucose-Capillary: 214 mg/dL — ABNORMAL HIGH (ref 70–99)
Glucose-Capillary: 253 mg/dL — ABNORMAL HIGH (ref 70–99)
Glucose-Capillary: 310 mg/dL — ABNORMAL HIGH (ref 70–99)
Glucose-Capillary: 315 mg/dL — ABNORMAL HIGH (ref 70–99)
Glucose-Capillary: 391 mg/dL — ABNORMAL HIGH (ref 70–99)
Glucose-Capillary: 97 mg/dL (ref 70–99)

## 2018-04-16 LAB — COMPREHENSIVE METABOLIC PANEL
ALT: 168 U/L — ABNORMAL HIGH (ref 0–44)
AST: 205 U/L — ABNORMAL HIGH (ref 15–41)
Albumin: 3.5 g/dL (ref 3.5–5.0)
Alkaline Phosphatase: 76 U/L (ref 38–126)
Anion gap: 11 (ref 5–15)
BUN: 18 mg/dL (ref 6–20)
CO2: 11 mmol/L — ABNORMAL LOW (ref 22–32)
Calcium: 8.1 mg/dL — ABNORMAL LOW (ref 8.9–10.3)
Chloride: 126 mmol/L — ABNORMAL HIGH (ref 98–111)
Creatinine, Ser: 0.88 mg/dL (ref 0.61–1.24)
GFR calc Af Amer: >60 mL/min (ref >60)
GFR calc non Af Amer: >60 mL/min (ref >60)
Glucose, Bld: 144 mg/dL — ABNORMAL HIGH (ref 70–99)
Potassium: 3.8 mmol/L (ref 3.5–5.1)
Sodium: 148 mmol/L — ABNORMAL HIGH (ref 135–145)
Total Bilirubin: 1.4 mg/dL — ABNORMAL HIGH (ref 0.3–1.2)
Total Protein: 6.4 g/dL — ABNORMAL LOW (ref 6.5–8.1)

## 2018-04-16 LAB — CBC WITH DIFFERENTIAL/PLATELET
Abs Immature Granulocytes: 0.43 10*3/uL — ABNORMAL HIGH (ref 0.00–0.07)
Basophils Absolute: 0.1 10*3/uL (ref 0.0–0.1)
Basophils Relative: 0 %
Eosinophils Absolute: 0 10*3/uL (ref 0.0–0.5)
Eosinophils Relative: 0 %
HCT: 38.2 % — ABNORMAL LOW (ref 39.0–52.0)
Hemoglobin: 11.6 g/dL — ABNORMAL LOW (ref 13.0–17.0)
Immature Granulocytes: 2 %
Lymphocytes Relative: 17 %
Lymphs Abs: 3 10*3/uL (ref 0.7–4.0)
MCH: 30.6 pg (ref 26.0–34.0)
MCHC: 30.4 g/dL (ref 30.0–36.0)
MCV: 100.8 fL — ABNORMAL HIGH (ref 80.0–100.0)
Monocytes Absolute: 1.2 10*3/uL — ABNORMAL HIGH (ref 0.1–1.0)
Monocytes Relative: 7 %
Neutro Abs: 13.3 10*3/uL — ABNORMAL HIGH (ref 1.7–7.7)
Neutrophils Relative %: 74 %
Platelets: 238 10*3/uL (ref 150–400)
RBC: 3.79 MIL/uL — ABNORMAL LOW (ref 4.22–5.81)
RDW: 13.2 % (ref 11.5–15.5)
WBC: 18 10*3/uL — ABNORMAL HIGH (ref 4.0–10.5)
nRBC: 0 % (ref 0.0–0.2)

## 2018-04-16 LAB — MAGNESIUM
Magnesium: 2.2 mg/dL (ref 1.7–2.4)
Magnesium: 2 mg/dL (ref 1.7–2.4)

## 2018-04-16 LAB — RENAL FUNCTION PANEL
Albumin: 3.9 g/dL (ref 3.5–5.0)
BUN: 22 mg/dL — ABNORMAL HIGH (ref 6–20)
CO2: <7 mmol/L — ABNORMAL LOW (ref 22–32)
Calcium: 8.4 mg/dL — ABNORMAL LOW (ref 8.9–10.3)
Chloride: 123 mmol/L — ABNORMAL HIGH (ref 98–111)
Creatinine, Ser: 1.12 mg/dL (ref 0.61–1.24)
GFR calc Af Amer: >60 mL/min (ref >60)
GFR calc non Af Amer: >60 mL/min (ref >60)
Glucose, Bld: 285 mg/dL — ABNORMAL HIGH (ref 70–99)
Phosphorus: 2.7 mg/dL (ref 2.5–4.6)
Potassium: 4.1 mmol/L (ref 3.5–5.1)
Sodium: 147 mmol/L — ABNORMAL HIGH (ref 135–145)

## 2018-04-16 LAB — PHOSPHORUS: Phosphorus: 1.7 mg/dL — ABNORMAL LOW (ref 2.5–4.6)

## 2018-04-16 LAB — HIV ANTIBODY (ROUTINE TESTING W REFLEX): HIV Screen 4th Generation wRfx: NONREACTIVE

## 2018-04-16 LAB — MRSA PCR SCREENING: MRSA by PCR: NEGATIVE

## 2018-04-16 LAB — T4, FREE: Free T4: 0.7 ng/dL — ABNORMAL LOW (ref 0.82–1.77)

## 2018-04-16 LAB — TSH: TSH: 0.267 u[IU]/mL — ABNORMAL LOW (ref 0.350–4.500)

## 2018-04-16 MED ORDER — POTASSIUM CHLORIDE 2 MEQ/ML IV SOLN: INTRAVENOUS | Status: DC

## 2018-04-16 MED ORDER — ENSURE ENLIVE PO LIQD
237.0000 mL | Freq: Three times a day (TID) | ORAL | Status: DC
  Administered 2018-04-16 – 2018-04-17 (×2): 237 mL via ORAL

## 2018-04-16 MED ORDER — SODIUM BICARBONATE 8.4 % IV SOLN
INTRAVENOUS | Status: DC
  Administered 2018-04-16: 11:00:00 via INTRAVENOUS
  Filled 2018-04-16: qty 150

## 2018-04-16 MED ORDER — SODIUM BICARBONATE 8.4 % IV SOLN: INTRAVENOUS | Status: DC

## 2018-04-16 MED ORDER — ADULT MULTIVITAMIN W/MINERALS CH
1.0000 | ORAL_TABLET | Freq: Every day | ORAL | Status: DC
  Administered 2018-04-16 – 2018-04-17 (×2): 1 via ORAL
  Filled 2018-04-16 (×2): qty 1

## 2018-04-16 MED ORDER — INSULIN ASPART 100 UNIT/ML ~~LOC~~ SOLN
0.0000 [IU] | Freq: Every day | SUBCUTANEOUS | Status: DC
  Administered 2018-04-16: 4 [IU] via SUBCUTANEOUS

## 2018-04-16 MED ORDER — INSULIN ASPART 100 UNIT/ML ~~LOC~~ SOLN
0.0000 [IU] | Freq: Three times a day (TID) | SUBCUTANEOUS | Status: DC
  Administered 2018-04-16: 1 [IU] via SUBCUTANEOUS
  Administered 2018-04-17: 3 [IU] via SUBCUTANEOUS

## 2018-04-16 MED ORDER — SODIUM CHLORIDE 0.9 % IV BOLUS
500.0000 mL | Freq: Once | INTRAVENOUS | Status: AC
  Administered 2018-04-16: 500 mL via INTRAVENOUS

## 2018-04-16 MED ORDER — INSULIN GLARGINE 100 UNIT/ML ~~LOC~~ SOLN
20.0000 [IU] | SUBCUTANEOUS | Status: DC
  Administered 2018-04-16 – 2018-04-17 (×2): 20 [IU] via SUBCUTANEOUS
  Filled 2018-04-16 (×2): qty 0.2

## 2018-04-16 MED ORDER — BOOST / RESOURCE BREEZE PO LIQD CUSTOM
1.0000 | Freq: Three times a day (TID) | ORAL | Status: DC
  Administered 2018-04-16 – 2018-04-17 (×2): 1 via ORAL

## 2018-04-16 MED ORDER — ONDANSETRON HCL 4 MG/2ML IJ SOLN
4.0000 mg | Freq: Four times a day (QID) | INTRAMUSCULAR | Status: DC | PRN
  Administered 2018-04-16: 4 mg via INTRAVENOUS
  Filled 2018-04-16: qty 2

## 2018-04-16 MED ORDER — KCL IN DEXTROSE-NACL 20-5-0.9 MEQ/L-%-% IV SOLN
INTRAVENOUS | Status: DC
  Administered 2018-04-16: 18:00:00 via INTRAVENOUS
  Filled 2018-04-16: qty 1000

## 2018-04-16 MED ORDER — POTASSIUM CHLORIDE CRYS ER 20 MEQ PO TBCR
40.0000 meq | EXTENDED_RELEASE_TABLET | ORAL | Status: AC
  Administered 2018-04-16 (×2): 40 meq via ORAL
  Filled 2018-04-16 (×2): qty 2

## 2018-04-16 NOTE — Progress Notes (Signed)
I sat down with pt and asked why he came in. He informed that he took his Lantus when he ate supper the night before he came in and did not take his short acting although he knew he should have taken the short acting insulin. Pt advised RN he just found out he qualified for insurance at his job so come May 22, 2018 he will have insurance. Pt informed RN his girlfriend is an LPN and is on him about taking care of his sugars. RN encouraged pt to check his sugars regularly and give his sliding scale and if needed recheck his sugar to make sure it is not increasing. Pt thanked me for taking the time to sit down and talk to him. Pt states no one has ever done that.

## 2018-04-16 NOTE — Progress Notes (Addendum)
Inpatient Diabetes Program Recommendations  AACE/ADA: New Consensus Statement on Inpatient Glycemic Control (2015)  Target Ranges:  Prepandial:   less than 140 mg/dL      Peak postprandial:   less than 180 mg/dL (1-2 hours)      Critically ill patients:  140 - 180 mg/dL   Results for [name] [name], [name] [name] (MRN [id number]) as of 04/16/2018 08:42  Ref. Range 04/15/2018 16:25  Sodium Latest Ref Range: 135 - 145 mmol/L 137  Potassium Latest Ref Range: 3.5 - 5.1 mmol/L 4.3  Chloride Latest Ref Range: 98 - 111 mmol/L 100  CO2 Latest Ref Range: 22 - 32 mmol/L 8 (L)  Glucose Latest Ref Range: 70 - 99 mg/dL 409713 (HH)  BUN Latest Ref Range: 6 - 20 mg/dL 25 (H)  Creatinine Latest Ref Range: 0.61 - 1.24 mg/dL 8.111.15  Calcium Latest Ref Range: 8.9 - 10.3 mg/dL 9.2  Anion gap Latest Ref Range: 5 - 15  29 (H)   Results for [name] [name], [name] [name] (MRN [id number]) as of 04/16/2018 08:42  Ref. Range 08/22/2017 08:18 09/20/2017 11:43 12/21/2017 09:45  Hemoglobin A1C Latest Ref Range: 4.8 - 5.6 % 12.6 (H) 13.0 (H) 13.0 (H)    Admit with: DKA  History: Type 1 DM, Frequent DKA  Home DM Meds: Lantus 20 units Daily       Novolog 10 units TID with meals  Current Orders:Lantus 20 units Daily    Novolog Sensitive Correction Scale/ SSI (0-9 units) TID AC + HS      Note Sodium Bicarb IVF started this AM at 75cc/hr.  Transitioning off the IV Insulin drip this AM--Lantus given at 8:41am today.  Patient well known to the Inpatient Diabetes Program.  This is patient's 9th admission for DKA since January 2019 ([facility] and other health systems for 2 of those admissions).  Counseled by the DM Coordinators many times in the past about the importance of good glucose control at home.  He has been educated during each admission on DM management, insulins, etc.  He lacks resources for management and likely will not be able to see endocrinologist due to lack of insurance.   [facility]   Addendum 11:30am- Tried to speak with pt this AM.  Pt had the covers over his head and would not respond to any of my questions.  Spoke with pt's RN and relayed this info to her.  RN told me she tried to assess pt's understanding of his disease process and ability to take insulin at home.  Pt told RN he knows how to take his insulin and checks his CBGs 3-4 times per day and that his CBGs run in the 200-300 range.  Not sure how to help this patient if he does not take any responsibility for his health.      --Will follow patient during hospitalization--  Ambrose FinlandJeannine Johnston Francisco Eyerly RN, MSN, CDE Diabetes Coordinator Inpatient Glycemic Control Team Team Pager: [phone] (8a-5p)

## 2018-04-16 NOTE — Progress Notes (Signed)
PROGRESS NOTE    [name] [name]  WUJ:[id number] DOB: 01-Dec-1985 DOA: 04/15/2018 PCP: Hoy RegisterNewlin, Enobong, MD  Outpatient Specialists:   Brief Narrative:  As per H and P - "Patient is a 32 year old African-American male, cachectic, with past medical history significant for type 1 diabetes mellitus, diabetes ketoacidosis and acute kidney injury.  Patient presented with elevated blood sugar (713), and another episode of DKA. Patient is a poor historian.  The only history I could get from the patient was that he developed nausea and vomiting about 2 days ago.  No fever or chills, no headache, no neck pain, no urinary symptoms.  On presentation to the ER, the blood sugar was noted to be 713, anion gap of 29, CO2 of 8 creatinine of 1.15, potassium of 4.3 and ABG revealing pH of 7.126, PCO2 of 22.7 and oxygen of 76.7.  Patient was admitted to the ICU and started on Phase 1 insulin protocol. DKA has resolved, and DKA phase protocol was initiated.  Significant acidosis was noted despite having closed the anion gap, patient denied history of chronic diarrhea, but still had intermittent vomiting. Patient's IVF was changed to bicarb drip with significant resolution of the acidosis. Abnormal electrolytes are being monitored and corrected, especially, hypokalemia.  Etiology of cachexia is unclear.  Kindly follow HIV screening and TSH.  Further management will depend on hospital course.  Assessment & Plan:   Active Problems:   DKA (diabetic ketoacidoses) (HCC)  DKA: DKA protocol DKA has resolved Continue to monitor electrolytes closely (potassium is noted to be on the low side) Significant acidosis despite anion gap correction.  Initially on bicarb drip, but back to normal saline with potassium.  Transitioned to phase 2 DKA protocol after anion gap corrected Further management depend on hospital course.  Cachexia: Could be secondary to poorly controlled diabetes mellitus Follow TSH Check albumin and  prealbumin Consider dietitian consult in the morning Follow HIV screening result  Further management depend on hospital course.  Volume depletion: Continue hydration. Monitor renal function and electrolytes closely. Resolved significantly.  Possible discharge in the next 24 hours.  May need home health nursing on discharge.  Compliance remains unclear.  DVT prophylaxis: Subcu Lovenox Code Status: Full code Family Communication:  Disposition Plan: Eventually Consults called: None  Procedures:   None  Antimicrobials:   None   Subjective: No new complaints No significant history from the patient  Objective: Vitals:   04/15/18 2023 04/15/18 2257 04/16/18 0200 04/16/18 0500  BP: 128/74   (!) 141/80  Pulse: (!) 148  (!) 116   Resp: 18 20 11 12   Temp: (!) 97.4 F (36.3 C)     TempSrc: Oral     SpO2: 100%  100%   Weight: 43.8 kg     Height: 5\' 6"  (1.676 m)       Intake/Output Summary (Last 24 hours) at 04/16/2018 0757 Last data filed at 04/16/2018 0400 Gross per 24 hour  Intake 5526.92 ml  Output 1700 ml  Net 3826.92 ml   Filed Weights   04/15/18 2023  Weight: 43.8 kg    Examination:  General exam: Appears calm and comfortable.  Cachectic Respiratory system: Respiratory effort normal.  Decreased entry globally Cardiovascular system: S1 & S2 heard. No pedal edema. Gastrointestinal system: Abdomen is nondistended, soft and nontender. No organomegaly or masses felt. Normal bowel sounds heard. Central nervous system: Awake and alert.  Patient moves all limbs.  Extremities: No leg edema.    Data Reviewed: I have personally  reviewed following labs and imaging studies  CBC: Recent Labs  Lab 04/15/18 1625  WBC 16.4*  HGB 13.1  HCT 42.7  MCV 100.0  PLT 312   Basic Metabolic Panel: Recent Labs  Lab 04/15/18 1625 04/15/18 2146 04/16/18 0120 04/16/18 0514  NA 137 143 147* 148*  K 4.3 5.8* 4.1 3.8  CL 100 113* 123* 126*  CO2 8* <7* <7* 11*    GLUCOSE 713* 660* 285* 144*  BUN 25* 25* 22* 18  CREATININE 1.15 1.29* 1.12 0.88  CALCIUM 9.2 8.8* 8.4* 8.1*  MG  --  2.1 2.2 2.0  PHOS  --  6.1* 2.7 1.7*   GFR: Estimated Creatinine Clearance: 73.3 mL/min (by C-G formula based on SCr of 0.88 mg/dL). Liver Function Tests: Recent Labs  Lab 04/15/18 2146 04/16/18 0120 04/16/18 0514  AST  --   --  205*  ALT  --   --  168*  ALKPHOS  --   --  76  BILITOT  --   --  1.4*  PROT  --   --  6.4*  ALBUMIN 4.1 3.9 3.5   No results for input(s): LIPASE, AMYLASE in the last 168 hours. No results for input(s): AMMONIA in the last 168 hours. Coagulation Profile: No results for input(s): INR, PROTIME in the last 168 hours. Cardiac Enzymes: No results for input(s): CKTOTAL, CKMB, CKMBINDEX, TROPONINI in the last 168 hours. BNP (last 3 results) No results for input(s): PROBNP in the last 8760 hours. HbA1C: No results for input(s): HGBA1C in the last 72 hours. CBG: Recent Labs  Lab 04/16/18 0226 04/16/18 0322 04/16/18 0425 04/16/18 0529 04/16/18 0632  GLUCAP 179* 214* 151* 125* 127*   Lipid Profile: Recent Labs    04/15/18 2319  TRIG 784*   Thyroid Function Tests: No results for input(s): TSH, T4TOTAL, FREET4, T3FREE, THYROIDAB in the last 72 hours. Anemia Panel: No results for input(s): VITAMINB12, FOLATE, FERRITIN, TIBC, IRON, RETICCTPCT in the last 72 hours. Urine analysis:    Component Value Date/Time   COLORURINE STRAW (A) 04/15/2018 1603   APPEARANCEUR CLEAR 04/15/2018 1603   LABSPEC 1.020 04/15/2018 1603   PHURINE 5.0 04/15/2018 1603   GLUCOSEU >=500 (A) 04/15/2018 1603   HGBUR NEGATIVE 04/15/2018 1603   BILIRUBINUR NEGATIVE 04/15/2018 1603   BILIRUBINUR Negative 02/07/2016 1528   KETONESUR 80 (A) 04/15/2018 1603   PROTEINUR NEGATIVE 04/15/2018 1603   UROBILINOGEN 0.2 02/07/2016 1528   UROBILINOGEN 0.2 03/22/2015 0913   NITRITE NEGATIVE 04/15/2018 1603   LEUKOCYTESUR NEGATIVE 04/15/2018 1603   Sepsis  Labs: @LABRCNTIP (procalcitonin:4,lacticidven:4)  ) Recent Results (from the past 240 hour(s))  MRSA PCR Screening     Status: None   Collection Time: 04/15/18  9:40 PM  Result Value Ref Range Status   MRSA by PCR NEGATIVE NEGATIVE Final    Comment:        The GeneXpert MRSA Assay (FDA approved for NASAL specimens only), is one component of a comprehensive MRSA colonization surveillance program. It is not intended to diagnose MRSA infection nor to guide or monitor treatment for MRSA infections. Performed at [facility], [address], [place], [place] [address]          Radiology Studies: No results found.      Scheduled Meds: . chlorhexidine  15 mL Mouth Rinse BID  . enoxaparin (LOVENOX) injection  40 mg Subcutaneous Q24H  . insulin aspart  0-5 Units Subcutaneous QHS  . insulin aspart  0-9 Units Subcutaneous TID  WC  . insulin glargine  20 Units Subcutaneous Q24H  . mouth rinse  15 mL Mouth Rinse q12n4p   Continuous Infusions: . dextrose 5 % 1,000 mL with sodium bicarbonate 150 mEq infusion       LOS: 1 day    Time spent: 35 minutes    Berton Mount, MD  [facility] Pager #: [phone] 7PM-7AM contact night coverage as above

## 2018-04-16 NOTE — Progress Notes (Signed)
Initial Nutrition Assessment  DOCUMENTATION CODES:   Severe malnutrition in context of chronic illness, Underweight  INTERVENTION:  - Will order Boost Breeze TID, each supplement provides 250 kcal and 9 grams of protein. - Will order Ensure Enlive TID, each supplement provides 350 kcal and 20 grams of protein. - Will order daily multivitamin with minerals. - Continue to encourage PO intakes. - Diet advancement as medically feasible.    NUTRITION DIAGNOSIS:   Severe Malnutrition related to chronic illness(Type 1 DM with episodes of DKA) as evidenced by moderate fat depletion, severe fat depletion, moderate muscle depletion, severe muscle depletion.  GOAL:   Patient will meet greater than or equal to 90% of their needs  MONITOR:   PO intake, Supplement acceptance, Weight trends, Labs  REASON FOR ASSESSMENT:   Consult Assessment of nutrition requirement/status  ASSESSMENT:   32 year old male with past medical history significant for Type 1 DM, DKA, and AKI.  Patient presents with elevated blood sugar (713 mg/dL). Patient is a poor historian. He developed N/V two days PTA.  Diet advanced from NPO to Carb Modified today at 7:50 AM and then downgraded to CLD at 10:25 AM. DM Coordinator saw patient this AM and note states that this is patient's 9th admission since 05/2017 for DKA. Reviewed DM Coordinator's note which indicates that patient reported to RN that he checks CBGs 3-4 times/dayand readings are typically in the 200s-300s.   Unable to obtain much information from patient. [facility] at which time patient had reported decreased appetite and intakes. At that time, he had reported to RD that he was interested in learning whatever he needed to in order to better control DM.   Per chart review, current weight is 96 lb and weight on 10/19 was 110 lb. This indicates 14 lb weight loss (13% body weight) in the past 1 month; significant for time  frame.   Medications reviewed; sliding scale Novolog, 20 units Lantus/day, 10 mEq IV KCl x2 doses 11/25.  Labs reviewed; CBGs: 120-310 mg/dL since [id number], Na: 119147 mmol/L, K: 3.4 mmol/L, Ca: 8.3 mg/dL. IVF; D5-150 mEq sodium bicarb @ 75 mL/hr (306 kcal).     NUTRITION - FOCUSED PHYSICAL EXAM:    Most Recent Value  Orbital Region  No depletion  Upper Arm Region  Severe depletion  Thoracic and Lumbar Region  Unable to assess  Buccal Region  Moderate depletion  Temple Region  Mild depletion  Clavicle Bone Region  Moderate depletion  Clavicle and Acromion Bone Region  Moderate depletion  Scapular Bone Region  Unable to assess  Dorsal Hand  Mild depletion  Patellar Region  Severe depletion  Anterior Thigh Region  Severe depletion  Posterior Calf Region  Severe depletion  Edema (RD Assessment)  None  Hair  Reviewed  Eyes  Reviewed  Mouth  Reviewed  Skin  Reviewed  Nails  Reviewed       Diet Order:   Diet Order            Diet clear liquid Room service appropriate? Yes; Fluid consistency: Thin  Diet effective now              EDUCATION NEEDS:   Not appropriate for education at this time  Skin:  Skin Assessment: Reviewed RN Assessment  Last BM:  11/25  Height:   Ht Readings from Last 1 Encounters:  04/15/18 5\' 6"  (1.676 m)    Weight:   Wt Readings from  Last 1 Encounters:  04/15/18 43.8 kg    Ideal Body Weight:  64.54 kg  BMI:  Body mass index is 15.59 kg/m.  Estimated Nutritional Needs:   Kcal:  1530-1750 kcal  Protein:  66-80 grams   Fluid:  >/= 1.6 L/day     Trenton Gammon, MS, RD, LDN, [facility] Inpatient Clinical Dietitian Pager # [phone] After hours/weekend pager # [phone]

## 2018-04-17 LAB — CBC WITH DIFFERENTIAL/PLATELET
ABS IMMATURE GRANULOCYTES: 0.04 10*3/uL (ref 0.00–0.07)
BASOS PCT: 0 %
Basophils Absolute: 0 10*3/uL (ref 0.0–0.1)
Eosinophils Absolute: 0 10*3/uL (ref 0.0–0.5)
Eosinophils Relative: 0 %
HCT: 35.2 % — ABNORMAL LOW (ref 39.0–52.0)
HEMOGLOBIN: 11.5 g/dL — AB (ref 13.0–17.0)
Immature Granulocytes: 1 %
LYMPHS PCT: 28 %
Lymphs Abs: 1.9 10*3/uL (ref 0.7–4.0)
MCH: 29.7 pg (ref 26.0–34.0)
MCHC: 32.7 g/dL (ref 30.0–36.0)
MCV: 91 fL (ref 80.0–100.0)
MONOS PCT: 6 %
Monocytes Absolute: 0.4 10*3/uL (ref 0.1–1.0)
NEUTROS ABS: 4.5 10*3/uL (ref 1.7–7.7)
Neutrophils Relative %: 65 %
PLATELETS: 228 10*3/uL (ref 150–400)
RBC: 3.87 MIL/uL — ABNORMAL LOW (ref 4.22–5.81)
RDW: 13.5 % (ref 11.5–15.5)
WBC: 6.8 10*3/uL (ref 4.0–10.5)
nRBC: 0 % (ref 0.0–0.2)

## 2018-04-17 LAB — BASIC METABOLIC PANEL
Anion gap: 4 — ABNORMAL LOW (ref 5–15)
BUN: 11 mg/dL (ref 6–20)
CO2: 24 mmol/L (ref 22–32)
Calcium: 7.6 mg/dL — ABNORMAL LOW (ref 8.9–10.3)
Chloride: 113 mmol/L — ABNORMAL HIGH (ref 98–111)
Creatinine, Ser: 0.57 mg/dL — ABNORMAL LOW (ref 0.61–1.24)
GFR calc Af Amer: >60 mL/min (ref >60)
GFR calc non Af Amer: >60 mL/min (ref >60)
Glucose, Bld: 241 mg/dL — ABNORMAL HIGH (ref 70–99)
Potassium: 3.3 mmol/L — ABNORMAL LOW (ref 3.5–5.1)
Sodium: 141 mmol/L (ref 135–145)

## 2018-04-17 LAB — MAGNESIUM: MAGNESIUM: 1.8 mg/dL (ref 1.7–2.4)

## 2018-04-17 LAB — GLUCOSE, CAPILLARY
Glucose-Capillary: 214 mg/dL — ABNORMAL HIGH (ref 70–99)
Glucose-Capillary: 270 mg/dL — ABNORMAL HIGH (ref 70–99)

## 2018-04-17 LAB — HEPATITIS PANEL, ACUTE
HCV Ab: <0.1 s/co ratio (ref 0.0–0.9)
Hep A IgM: NEGATIVE
Hep B C IgM: NEGATIVE
Hepatitis B Surface Ag: NEGATIVE

## 2018-04-17 LAB — PHOSPHORUS: PHOSPHORUS: 1.6 mg/dL — AB (ref 2.5–4.6)

## 2018-04-17 LAB — HIV ANTIBODY (ROUTINE TESTING W REFLEX): HIV Screen 4th Generation wRfx: NONREACTIVE

## 2018-04-17 MED ORDER — POTASSIUM CHLORIDE IN NACL 20-0.9 MEQ/L-% IV SOLN
INTRAVENOUS | Status: DC
  Administered 2018-04-17: 02:00:00 via INTRAVENOUS
  Filled 2018-04-17 (×2): qty 1000

## 2018-04-17 MED ORDER — POTASSIUM CHLORIDE CRYS ER 20 MEQ PO TBCR
40.0000 meq | EXTENDED_RELEASE_TABLET | ORAL | Status: AC
  Administered 2018-04-17 (×2): 40 meq via ORAL
  Filled 2018-04-17 (×2): qty 2

## 2018-04-17 MED ORDER — INSULIN ASPART 100 UNIT/ML ~~LOC~~ SOLN: 3.0000 [IU] | Freq: Once | SUBCUTANEOUS | Status: DC

## 2018-04-17 MED ORDER — POTASSIUM PHOSPHATES 15 MMOLE/5ML IV SOLN
20.0000 mmol | Freq: Once | INTRAVENOUS | Status: DC
  Filled 2018-04-17: qty 6.67

## 2018-04-17 NOTE — Care Management Note (Signed)
Case Management Note  Patient Details  Name: [name] [name] MRN: [id number] Date of Birth: [birth year]  Subjective/Objective:                   32 year old African-American male, cachectic, with past medical history significant for type 1 diabetes mellitus, diabetes ketoacidosis and acute kidney injury. Patient presented with elevated blood sugar (713), and another episode of DKA. Patient is a poor historian. The only history I could get from the patient was that he developed nausea and vomiting about 2 days ago. No fever or chills, no headache, no neck pain, no urinary symptoms. On presentation to the ER, the blood sugar was noted to be 713, anion gap of 29, CO2 of 8 creatinine of 1.15, potassium of 4.3 and ABG revealing pH of 7.126, PCO2 of 22.7 and oxygen of 76.7. Patient was admitted to the ICU and started on Phase 1 insulin protocol. DKA has resolved, and DKA phase protocol was initiated.  Significant acidosis was noted despite having closed the anion gap, patient denied history of chronic diarrhea, but still had intermittent vomiting. Patient's IVF was changed to bicarb drip with significant resolution of the acidosis. Abnormal electrolytes are being monitored and corrected, especially, hypokalemia.  Etiology of cachexia is unclear.  Kindly follow HIV screening and TSH.  Further management will depend on hospital course. Active Problems:   DKA (diabetic ketoacidoses) (HCC)  Action/Plan: Following for progression of care. Following for cm needs, none present at this time, no discharge plans at this time.  Expected Discharge Date:                  Expected Discharge Plan:  Home/Self Care  In-House Referral:     Discharge planning Services  CM Consult  Post Acute Care Choice:    Choice offered to:     DME Arranged:    DME Agency:     HH Arranged:    HH Agency:     Status of Service:  In process, will continue to follow  If discussed at Long Length of Stay Meetings, dates  discussed:    Additional Comments:  Golda AcreDavis, Rhonda Lynn, RN 04/17/2018, 9:40 AM

## 2018-04-17 NOTE — Progress Notes (Signed)
Inpatient Diabetes Program Recommendations  AACE/ADA: New Consensus Statement on Inpatient Glycemic Control (2015)  Target Ranges:  Prepandial:   less than 140 mg/dL      Peak postprandial:   less than 180 mg/dL (1-2 hours)      Critically ill patients:  140 - 180 mg/dL   Results for [name] [name], [name] [name] (MRN [id number]) as of 04/17/2018 08:28  Ref. Range 04/16/2018 10:46 04/16/2018 12:13 04/16/2018 16:14 04/16/2018 21:30  Glucose-Capillary Latest Ref Range: 70 - 99 mg/dL 213128 (H) 086120 (H) 97 578315 (H)   Results for [name] [name], [name] [name] (MRN [id number]) as of 04/17/2018 08:28  Ref. Range 04/17/2018 07:53  Glucose-Capillary Latest Ref Range: 70 - 99 mg/dL 413214 (H)    Admit with: DKA  History: Type 1 DM, Frequent DKA  Home DM Meds: Lantus 20 units Daily                             Novolog 10 units TID with meals  Current Orders:Lantus 20 units Daily                          Novolog Sensitive Correction Scale/ SSI (0-9 units) TID AC + HS     Getting Ensure Enlive nutritional supplements along with Clear Liquid diet.  Supplements may be causing glucose elevations.    MD- If patient's PO Intake improves, may consider starting Novolog Meal Coverage:  Novolog 3 units TID with meals  (Please add the following Hold Parameters: Hold if pt eats <50% of meal, Hold if pt NPO)     --Will follow patient during hospitalization--  Ambrose FinlandJeannine Johnston Christyl Osentoski RN, MSN, CDE Diabetes Coordinator Inpatient Glycemic Control Team Team Pager: [phone] (8a-5p)

## 2018-04-17 NOTE — Discharge Summary (Signed)
Physician Discharge Summary  [name] [name] YSA:[id number] DOB: [birth year] DOA: 04/15/2018  PCP: Charlott Rakes, MD  Admit date: 04/15/2018 Discharge date: 04/17/2018  Admitted From: Home Disposition: LEFT AMA  Recommendations for Outpatient Follow-up:  1. Follow up with PCP in 1-2 weeks 2. Please obtain CMP/CBC, Mag, Phos in one week=  Home Health: No Equipment/Devices: None  Discharge Condition: Guarded CODE STATUS: FULL CODE Diet recommendation: Carb Modified Diet  Brief/Interim Summary: Per Dr. Dana Allan on 04/15/18 Patient is a 32 year old African-American male, cachectic, with past medical history significant for type 1 diabetes mellitus, diabetes ketoacidosis and acute kidney injury.  Patient presents with elevated blood sugar (713), and another episode of DKA. Patient is a poor historian.  The only history I could get from the patient was that he developed nausea and vomiting about 2 days ago.  No fever or chills, no headache, no neck pain, no urinary symptoms.  On presentation to the ER, the blood sugar was noted to be 713, anion gap of 29, CO2 of 8 creatinine of 1.15, potassium of 4.3 and ABG revealing pH of 7.126, PCO2 of 22.7 and oxygen of 76.7.  Patient will be admitted for further assessment and management.  **DKA improved however patient remained hyperglycemic and p.o. intake was not as adequate.  Patient decided to sign out [place].  He was of sound mind when he decided to do so and understood the risks of leaving [place] including worsening, decompensation even possible death and understood these risks and signed the papers and left the hospital.  Discharge Diagnoses:  Active Problems:   DKA (diabetic ketoacidoses) (HCC)  DKA: -DKA protocol -DKA has resolved -Continue to monitor electrolytes closely (potassium is noted to be on the low side) -Significant acidosis despite anion gap correction.  -Initially on bicarb drip, but back  to normal saline with potassium.  -Transitioned to phase 2 DKA protocol after anion gapcorrected yesterday -Further management dependent on hospital course. -Blood sugars are still running high and diabetes coordinator made some adjustments which I have implemented however patient signed out AGAINST MEDICAL ADVICE  Cachexia: -Could be secondary to poorly controlled diabetes mellitus -TSH was 0.70 and TSH was 0.267; Possibly related to Hyperthyroidism -Will need further Endocrine Workup  Check albumin and prealbumin -Dietician involved as below -Follow HIV screening Negative   -Further management depend on hospital course -Signed out [place].  Volume depletion: -Continued hydration with NS + 20 mEQ KCl at 75 mL/r -Monitor renal function and electrolytes closely. -Resolved significantly.  Hypophosphatemia -Patient's phosphorus level this morning is 1.6 -Replete with IV K-Phos 20 mmol -Continue to monitor and replete as necessary -Patient signed out [place] prior to completion of K-Phos  Hypokalemia -Patient potassium this morning was 3.3 -Replete with p.o. potassium chloride -Continue monitor replete as necessary -Patient left AGAINST MEDICAL ADVICE   Normocytic Anemia -Patient's hemoglobin/hematocrit was 11.5/35.2 this a.m. -likely was hemoconcentrated on admission and was 13.1/42.7 -Continue to monitor for signs and symptoms of bleeding -Patient signed out AGAINST MEDICAL ADVICE  AKI -Improved in the setting of dehydration and volume depletion from DKA -Improved and BUN/creatinine was 11/0.57 this a.m. -Was given IV fluid hydration and patient signed out [place]  Abnormal LFTs -Was going to repeat liver panel this afternoon but patient signed out [place]  Severe malnutrition in the context of chronic illness -Nutrition is consulted for further evaluation recommendations -Nutrition is ordered boost breeze 3  times daily along with  Ensure Enlive 3 times daily along with multivitamin daily.  Nutritionist also encourage p.o. intake  Discharge Instructions   Allergies as of 04/17/2018   No Known Allergies     Medication List    TAKE these medications   insulin aspart 100 UNIT/ML injection Commonly known as:  novoLOG Inject 10 Units into the skin 3 (three) times daily before meals.   Insulin Glargine 100 UNIT/ML Solostar Pen Commonly known as:  LANTUS Inject 20 Units into the skin daily.   ondansetron 4 MG disintegrating tablet Commonly known as:  ZOFRAN-ODT Take 1 tablet (4 mg total) by mouth every 8 (eight) hours as needed for nausea or vomiting.   TRUE METRIX METER w/Device Kit 1 each by Does not apply route as needed.       No Known Allergies  Consultations:  Diabetes Education Coordinator   Procedures/Studies: No results found.   Subjective: Seen and examined this morning and states he was feeling a little fatigued and had not walked.  Diet was advanced and this morning patient decided that he did not want to be in the hospital so he signed out [place] knowing the risks of worsening, decompensation and even possible death.  Is recommended that he follow-up with his PCP.  Discharge Exam: Vitals:   04/17/18 0600 04/17/18 0800  BP:  (!) 145/108  Pulse: 86 84  Resp: 20 15  Temp:  98.5 F (36.9 C)  SpO2: 100% 99%   Vitals:   04/17/18 0400 04/17/18 0500 04/17/18 0600 04/17/18 0800  BP: (!) 130/102   (!) 145/108  Pulse: 87  86 84  Resp: _0 Temp: 98.2 F (36.8 C)   98.5 F (36.9 C)  TempSrc: Oral   Oral  SpO2: 100%  100% 99%  Weight:      Height:       General: Pt is alert, awake, not in acute distress; thin and cachectic  Cardiovascular: RRR, S1/S2 +, no rubs, no gallops Respiratory: Diminished bilaterally, no wheezing, no rhonchi Abdominal: Soft, NT, ND, bowel sounds + Extremities: no edema, no cyanosis  The results of significant  diagnostics from this hospitalization (including imaging, microbiology, ancillary and laboratory) are listed below for reference.    Microbiology: Recent Results (from the past 240 hour(s))  MRSA PCR Screening     Status: None   Collection Time: 04/15/18  9:40 PM  Result Value Ref Range Status   MRSA by PCR NEGATIVE NEGATIVE Final    Comment:        The GeneXpert MRSA Assay (FDA approved for NASAL specimens only), is one component of a comprehensive MRSA colonization surveillance program. It is not intended to diagnose MRSA infection nor to guide or monitor treatment for MRSA infections. Performed at [facility], [place] [address], [place], [place] [address]     Labs: BNP (last 3 results) No results for input(s): BNP in the last 8760 hours. Basic Metabolic Panel: Recent Labs  Lab 04/15/18 2146 04/16/18 0120 04/16/18 0514 04/16/18 0825 04/16/18 1552 04/17/18 0009 04/17/18 0012  NA 143 147* 148* 147* 144  --  141  K 5.8* 4.1 3.8 3.4* 3.1*  --  3.3*  CL 113* 123* 126* 122* 117*  --  113*  CO2 <7* <7* 11* 16* 21*  --  24  GLUCOSE 660* 285* 144* 136* 95  --  241*  BUN 25* 22* _1 --  11  CREATININE 1.29* 1.12 0.88 0.61 0.63  --  0.57*  CALCIUM 8.8* 8.4* 8.1* 8.3* 7.7*  --  7.6*  MG 2.1 2.2 2.0  --   --  1.8  --   PHOS 6.1* 2.7 1.7*  --   --  1.6*  --    Liver Function Tests: Recent Labs  Lab 04/15/18 2146 04/16/18 0120 04/16/18 0514  AST  --   --  205*  ALT  --   --  168*  ALKPHOS  --   --  76  BILITOT  --   --  1.4*  PROT  --   --  6.4*  ALBUMIN 4.1 3.9 3.5   No results for input(s): LIPASE, AMYLASE in the last 168 hours. No results for input(s): AMMONIA in the last 168 hours. CBC: Recent Labs  Lab 04/15/18 1625 04/16/18 0514 04/17/18 1002  WBC 16.4* 18.0* 6.8  NEUTROABS  --  13.3* 4.5  HGB 13.1 11.6* 11.5*  HCT 42.7 38.2* 35.2*  MCV 100.0 100.8* 91.0  PLT 312 238 228   Cardiac Enzymes: No results for input(s): CKTOTAL,  CKMB, CKMBINDEX, TROPONINI in the last 168 hours. BNP: Invalid input(s): POCBNP CBG: Recent Labs  Lab 04/16/18 1213 04/16/18 1614 04/16/18 2130 04/17/18 0753 04/17/18 1125  GLUCAP 120* 97 315* 214* 270*   D-Dimer No results for input(s): DDIMER in the last 72 hours. Hgb A1c No results for input(s): HGBA1C in the last 72 hours. Lipid Profile Recent Labs    04/15/18 2319  TRIG 784*   Thyroid function studies Recent Labs    04/16/18 0825  TSH 0.267*   Anemia work up No results for input(s): VITAMINB12, FOLATE, FERRITIN, TIBC, IRON, RETICCTPCT in the last 72 hours. Urinalysis    Component Value Date/Time   COLORURINE STRAW (A) 04/15/2018 1603   APPEARANCEUR CLEAR 04/15/2018 1603   LABSPEC 1.020 04/15/2018 1603   PHURINE 5.0 04/15/2018 1603   GLUCOSEU >=500 (A) 04/15/2018 1603   HGBUR NEGATIVE 04/15/2018 1603   BILIRUBINUR NEGATIVE 04/15/2018 1603   BILIRUBINUR Negative 02/07/2016 1528   KETONESUR 80 (A) 04/15/2018 1603   PROTEINUR NEGATIVE 04/15/2018 1603   UROBILINOGEN 0.2 02/07/2016 1528   UROBILINOGEN 0.2 03/22/2015 0913   NITRITE NEGATIVE 04/15/2018 1603   LEUKOCYTESUR NEGATIVE 04/15/2018 1603   Sepsis Labs Invalid input(s): PROCALCITONIN,  WBC,  LACTICIDVEN Microbiology Recent Results (from the past 240 hour(s))  MRSA PCR Screening     Status: None   Collection Time: 04/15/18  9:40 PM  Result Value Ref Range Status   MRSA by PCR NEGATIVE NEGATIVE Final    Comment:        The GeneXpert MRSA Assay (FDA approved for NASAL specimens only), is one component of a comprehensive MRSA colonization surveillance program. It is not intended to diagnose MRSA infection nor to guide or monitor treatment for MRSA infections. Performed at [facility], [place] [address] [place], [place] [address]    Time coordinating discharge: 35 minutes  SIGNED:  Kerney Elbe, DO [facility] 04/17/2018, 12:01 PM Pager is on [place]  If  7PM-7AM, please contact night-coverage [email or web address] Password [email or web address]

## 2018-04-17 NOTE — Progress Notes (Addendum)
Patient removed IV access. RN staff explained to patient that another IV needed to be placed since patient was admitted to the ICU/SD. Patient did not want to be stuck anymore and wanted to leave AMA. The risk for leaving against medical advice was explained to patient, and patient verbalized understanding. MD Marland McalpineSheikh was paged by Percival SpanishAlana RN to discuss situation.

## 2018-04-17 NOTE — Progress Notes (Signed)
Patient lost IV access and stated that he would leave if we needed to insert a new IV. MD paged and RN ordered to give patient AMA form. Patient signed AMA form and was advised to continue with home insulin regimen and to return to the ED if he had any issues with CBG again. AMA form placed in shadow chart.

## 2018-04-22 ENCOUNTER — Other Ambulatory Visit: Payer: Self-pay

## 2018-04-22 ENCOUNTER — Encounter (HOSPITAL_COMMUNITY): Payer: Self-pay

## 2018-04-22 ENCOUNTER — Inpatient Hospital Stay (HOSPITAL_COMMUNITY)
Admission: EM | Admit: 2018-04-22 | Discharge: 2018-04-23 | DRG: 637 | Disposition: A | Discharge status: Home / Self Care | Payer: Self-pay | Attending: Internal Medicine | Admitting: Internal Medicine

## 2018-04-22 DIAGNOSIS — E43 Unspecified severe protein-calorie malnutrition: Secondary | ICD-10-CM | POA: Diagnosis present

## 2018-04-22 DIAGNOSIS — E875 Hyperkalemia: Secondary | ICD-10-CM | POA: Diagnosis present

## 2018-04-22 DIAGNOSIS — E1069 Type 1 diabetes mellitus with other specified complication: Secondary | ICD-10-CM

## 2018-04-22 DIAGNOSIS — E1010 Type 1 diabetes mellitus with ketoacidosis without coma: Principal | ICD-10-CM | POA: Diagnosis present

## 2018-04-22 DIAGNOSIS — E785 Hyperlipidemia, unspecified: Secondary | ICD-10-CM | POA: Diagnosis present

## 2018-04-22 DIAGNOSIS — E1110 Type 2 diabetes mellitus with ketoacidosis without coma: Secondary | ICD-10-CM | POA: Diagnosis present

## 2018-04-22 DIAGNOSIS — IMO0002 Reserved for concepts with insufficient information to code with codable children: Secondary | ICD-10-CM | POA: Diagnosis present

## 2018-04-22 DIAGNOSIS — E860 Dehydration: Secondary | ICD-10-CM | POA: Diagnosis present

## 2018-04-22 DIAGNOSIS — Z681 Body mass index (BMI) 19 or less, adult: Secondary | ICD-10-CM

## 2018-04-22 DIAGNOSIS — Z833 Family history of diabetes mellitus: Secondary | ICD-10-CM

## 2018-04-22 DIAGNOSIS — Z794 Long term (current) use of insulin: Secondary | ICD-10-CM

## 2018-04-22 DIAGNOSIS — Z87891 Personal history of nicotine dependence: Secondary | ICD-10-CM

## 2018-04-22 DIAGNOSIS — E1065 Type 1 diabetes mellitus with hyperglycemia: Secondary | ICD-10-CM

## 2018-04-22 DIAGNOSIS — Z9119 Patient's noncompliance with other medical treatment and regimen: Secondary | ICD-10-CM

## 2018-04-22 DIAGNOSIS — N179 Acute kidney failure, unspecified: Secondary | ICD-10-CM | POA: Diagnosis present

## 2018-04-22 DIAGNOSIS — R197 Diarrhea, unspecified: Secondary | ICD-10-CM | POA: Diagnosis present

## 2018-04-22 DIAGNOSIS — G9341 Metabolic encephalopathy: Secondary | ICD-10-CM | POA: Diagnosis present

## 2018-04-22 LAB — BASIC METABOLIC PANEL
Anion gap: 25 — ABNORMAL HIGH (ref 5–15)
Anion gap: 15 (ref 5–15)
BUN: 30 mg/dL — ABNORMAL HIGH (ref 6–20)
BUN: 30 mg/dL — ABNORMAL HIGH (ref 6–20)
BUN: 29 mg/dL — ABNORMAL HIGH (ref 6–20)
BUN: 25 mg/dL — ABNORMAL HIGH (ref 6–20)
CO2: <7 mmol/L — ABNORMAL LOW (ref 22–32)
CO2: <7 mmol/L — ABNORMAL LOW (ref 22–32)
CO2: <7 mmol/L — ABNORMAL LOW (ref 22–32)
CO2: 11 mmol/L — ABNORMAL LOW (ref 22–32)
Calcium: 8.7 mg/dL — ABNORMAL LOW (ref 8.9–10.3)
Calcium: 7.6 mg/dL — ABNORMAL LOW (ref 8.9–10.3)
Calcium: 7 mg/dL — ABNORMAL LOW (ref 8.9–10.3)
Calcium: 7.1 mg/dL — ABNORMAL LOW (ref 8.9–10.3)
Chloride: 94 mmol/L — ABNORMAL LOW (ref 98–111)
Chloride: 104 mmol/L (ref 98–111)
Chloride: 114 mmol/L — ABNORMAL HIGH (ref 98–111)
Chloride: 120 mmol/L — ABNORMAL HIGH (ref 98–111)
Creatinine, Ser: 1.57 mg/dL — ABNORMAL HIGH (ref 0.61–1.24)
Creatinine, Ser: 1.46 mg/dL — ABNORMAL HIGH (ref 0.61–1.24)
Creatinine, Ser: 1.3 mg/dL — ABNORMAL HIGH (ref 0.61–1.24)
Creatinine, Ser: 1.01 mg/dL (ref 0.61–1.24)
GFR calc Af Amer: >60 mL/min (ref >60)
GFR calc Af Amer: >60 mL/min (ref >60)
GFR calc Af Amer: >60 mL/min (ref >60)
GFR calc Af Amer: >60 mL/min (ref >60)
GFR calc non Af Amer: 57 mL/min — ABNORMAL LOW (ref >60)
GFR calc non Af Amer: >60 mL/min (ref >60)
GFR calc non Af Amer: >60 mL/min (ref >60)
GFR calc non Af Amer: >60 mL/min (ref >60)
GLUCOSE: 681 mg/dL — AB (ref 70–99)
Glucose, Bld: 714 mg/dL (ref 70–99)
Glucose, Bld: 474 mg/dL — ABNORMAL HIGH (ref 70–99)
Glucose, Bld: 180 mg/dL — ABNORMAL HIGH (ref 70–99)
POTASSIUM: 4.9 mmol/L (ref 3.5–5.1)
POTASSIUM: 3.9 mmol/L (ref 3.5–5.1)
POTASSIUM: 3.3 mmol/L — AB (ref 3.5–5.1)
Potassium: 5.2 mmol/L — ABNORMAL HIGH (ref 3.5–5.1)
Sodium: 134 mmol/L — ABNORMAL LOW (ref 135–145)
Sodium: 142 mmol/L (ref 135–145)
Sodium: 143 mmol/L (ref 135–145)
Sodium: 146 mmol/L — ABNORMAL HIGH (ref 135–145)

## 2018-04-22 LAB — PHOSPHORUS: Phosphorus: 5.7 mg/dL — ABNORMAL HIGH (ref 2.5–4.6)

## 2018-04-22 LAB — CBG MONITORING, ED
Glucose-Capillary: >600 mg/dL (ref 70–99)
Glucose-Capillary: 570 mg/dL (ref 70–99)
Glucose-Capillary: 548 mg/dL (ref 70–99)

## 2018-04-22 LAB — CBC
HCT: 47.7 % (ref 39.0–52.0)
Hemoglobin: 14.2 g/dL (ref 13.0–17.0)
MCH: 30.2 pg (ref 26.0–34.0)
MCHC: 29.8 g/dL — ABNORMAL LOW (ref 30.0–36.0)
MCV: 101.5 fL — ABNORMAL HIGH (ref 80.0–100.0)
Platelets: 355 10*3/uL (ref 150–400)
RBC: 4.7 MIL/uL (ref 4.22–5.81)
RDW: 13.3 % (ref 11.5–15.5)
WBC: 14.6 10*3/uL — ABNORMAL HIGH (ref 4.0–10.5)
nRBC: 0 % (ref 0.0–0.2)

## 2018-04-22 LAB — MAGNESIUM: Magnesium: 2.3 mg/dL (ref 1.7–2.4)

## 2018-04-22 LAB — GLUCOSE, CAPILLARY
GLUCOSE-CAPILLARY: 268 mg/dL — AB (ref 70–99)
Glucose-Capillary: 473 mg/dL — ABNORMAL HIGH (ref 70–99)
Glucose-Capillary: 438 mg/dL — ABNORMAL HIGH (ref 70–99)
Glucose-Capillary: 343 mg/dL — ABNORMAL HIGH (ref 70–99)
Glucose-Capillary: 264 mg/dL — ABNORMAL HIGH (ref 70–99)
Glucose-Capillary: 222 mg/dL — ABNORMAL HIGH (ref 70–99)
Glucose-Capillary: 139 mg/dL — ABNORMAL HIGH (ref 70–99)
Glucose-Capillary: 159 mg/dL — ABNORMAL HIGH (ref 70–99)
Glucose-Capillary: 104 mg/dL — ABNORMAL HIGH (ref 70–99)
Glucose-Capillary: 121 mg/dL — ABNORMAL HIGH (ref 70–99)

## 2018-04-22 LAB — URINALYSIS, ROUTINE W REFLEX MICROSCOPIC
Bilirubin Urine: NEGATIVE
Glucose, UA: >=500 mg/dL — AB
Ketones, ur: 80 mg/dL — AB
Leukocytes, UA: NEGATIVE
Nitrite: NEGATIVE
Protein, ur: NEGATIVE mg/dL
Specific Gravity, Urine: 1.016 (ref 1.005–1.030)
pH: 5 (ref 5.0–8.0)

## 2018-04-22 LAB — I-STAT CG4 LACTIC ACID, ED
Lactic Acid, Venous: 5.89 mmol/L (ref 0.5–1.9)
Lactic Acid, Venous: 9.16 mmol/L (ref 0.5–1.9)

## 2018-04-22 LAB — LACTIC ACID, PLASMA: Lactic Acid, Venous: 3.7 mmol/L (ref 0.5–1.9)

## 2018-04-22 LAB — HEMOGLOBIN A1C
Hgb A1c MFr Bld: 14 % — ABNORMAL HIGH (ref 4.8–5.6)
Mean Plasma Glucose: 355.1 mg/dL

## 2018-04-22 LAB — ALBUMIN: Albumin: 3.8 g/dL (ref 3.5–5.0)

## 2018-04-22 MED ORDER — SODIUM CHLORIDE 0.9 % IV SOLN
INTRAVENOUS | Status: DC
  Administered 2018-04-22: 12:00:00 via INTRAVENOUS

## 2018-04-22 MED ORDER — LACTATED RINGERS IV SOLN: INTRAVENOUS | Status: DC

## 2018-04-22 MED ORDER — ONDANSETRON HCL 4 MG/2ML IJ SOLN
4.0000 mg | Freq: Once | INTRAMUSCULAR | Status: AC
  Administered 2018-04-22: 4 mg via INTRAVENOUS
  Filled 2018-04-22: qty 2

## 2018-04-22 MED ORDER — INSULIN REGULAR(HUMAN) IN NACL 100-0.9 UT/100ML-% IV SOLN
INTRAVENOUS | Status: AC
  Filled 2018-04-22: qty 100

## 2018-04-22 MED ORDER — INSULIN REGULAR(HUMAN) IN NACL 100-0.9 UT/100ML-% IV SOLN
INTRAVENOUS | Status: DC
  Administered 2018-04-22: 5.1 [IU]/h via INTRAVENOUS
  Filled 2018-04-22: qty 100

## 2018-04-22 MED ORDER — SODIUM BICARBONATE 8.4 % IV SOLN
50.0000 meq | Freq: Once | INTRAVENOUS | Status: AC
  Administered 2018-04-22: 50 meq via INTRAVENOUS
  Filled 2018-04-22: qty 50

## 2018-04-22 MED ORDER — DEXTROSE-NACL 5-0.45 % IV SOLN
INTRAVENOUS | Status: AC
  Administered 2018-04-22: 19:00:00 via INTRAVENOUS

## 2018-04-22 MED ORDER — SODIUM CHLORIDE 0.9 % IV SOLN
INTRAVENOUS | Status: AC
  Administered 2018-04-22: 12:00:00 via INTRAVENOUS

## 2018-04-22 MED ORDER — SODIUM CHLORIDE 0.9 % IV BOLUS
3000.0000 mL | Freq: Once | INTRAVENOUS | Status: AC
  Administered 2018-04-22: 3000 mL via INTRAVENOUS

## 2018-04-22 NOTE — ED Notes (Signed)
Patient pulled out IV stating "Why do I even need this? Why am I here?" Patient's altered mental status continues to get worse. Consulting civil engineerCharge RN and [facility] notified. MD notified -safety sitter ordered.

## 2018-04-22 NOTE — ED Notes (Signed)
5.89 Critical lactic acid reported to RN Cari and EDP Juleen ChinaKohut

## 2018-04-22 NOTE — ED Notes (Signed)
Bed: [id number] Expected date:  Expected time:  Means of arrival:  Comments: EMS- 32yo M, DKA

## 2018-04-22 NOTE — ED Notes (Addendum)
This RN noted patient pulling at cords. This RN went and requisitioned patient in bed and attempted to hide cords. This RN explained to patient why the cords and IVs were needed. Patient nods understanding. Patient covered with warm blanket.

## 2018-04-22 NOTE — H&P (Addendum)
History and Physical    Crew Goren  HCW:[id number]  DOB: 1986/01/28  DOA: 04/22/2018 PCP: Charlott Rakes, MD   Patient coming from: home  Chief Complaint: "DKA"  HPI: [name] [name] is a 32 y.o. male with medical history of 32 y/o with DM1 and multiple admissions for DKA (this is the 8th one this year).  On my eval he seems incoherent. persistantly repeats DKA as an answer to my questions. Knows his Bday but does when asked the month, states "DKA" again. RN states he pulled his IV out due to disorientation.  Per Triage note, comes in for 3 days of nausea/ vomiting/ weakness/ polydipsia/ polyuria and diarrhea  ED Course: glucose 714, pH 6.9, CO2 < 7, Cr 1.57, LA 5.89>> 9.16, WBC 14.6  Review of Systems:  Cannot obtain due to confusion    Past Medical History:  Diagnosis Date  . ARF (acute renal failure) ([place]) 2011   pt was on dialysis "for 1 day approx 2011, pt is no longer on dialysis (03/09/2017)  . DKA (diabetic ketoacidoses) ([facility])    "several times" (03/09/2017)  . Type I diabetes mellitus ([facility]) Dx 2012    Past Surgical History:  Procedure Laterality Date  . NO PAST SURGERIES      Social History:   reports that he quit smoking about 4 years ago. His smoking use included cigarettes. He has a 2.50 pack-year smoking history. He has never used smokeless tobacco. He reports that he drinks alcohol. He reports that he does not use drugs.  No Known Allergies  Family History  Problem Relation Age of Onset  . Diabetes Father   . Diabetes Maternal Grandmother   . Heart disease Neg Hx   . Hypertension Neg Hx      Prior to Admission medications   Medication Sig Start Date End Date Taking? Authorizing Provider  insulin aspart (NOVOLOG) 100 UNIT/ML injection Inject 10 Units into the skin 3 (three) times daily before meals. 01/03/18  Yes Ledell Noss, MD  Insulin Glargine (LANTUS) 100 UNIT/ML Solostar Pen Inject 20 Units into the skin daily. 01/03/18  Yes Ledell Noss, MD  Blood  Glucose Monitoring Suppl (TRUE METRIX METER) w/Device KIT 1 each by Does not apply route as needed. 07/12/16   Florencia Reasons, MD  ondansetron (ZOFRAN ODT) 4 MG disintegrating tablet Take 1 tablet (4 mg total) by mouth every 8 (eight) hours as needed for nausea or vomiting. Patient not taking: Reported on 04/22/2018 03/09/18   Francine Graven, DO    Physical Exam: Wt Readings from Last 3 Encounters:  04/22/18 43.8 kg  04/15/18 43.8 kg  03/09/18 49.9 kg   Vitals:   04/22/18 1030 04/22/18 1100 04/22/18 1130 04/22/18 1200  BP: (!) 134/95 119/83 (!) 134/93 (!) 144/89  Pulse: 77 (!) 132 (!) 132 (!) 130  Resp:  (!) 26 (!) 26 20  Temp:      TempSrc:      SpO2: 92% 100% 100% 100%  Weight:      Height:          Constitutional:  Calm & comfortable-very thin,  appears dazed, staring in a dazed manner and not conversing appropriately- repeating "DKA" Eyes: PERRLA, lids and conjunctivae normal ENT:  Mucous membranes are dry  Pharynx clear of exudate   Normal dentition.  Neck: Supple, no masses  Respiratory:  Clear to auscultation bilaterally  Breathing with RR in 30-40s Cardiovascular:  S1 & S2 heard, regular rate and rhythm- tachycardia No Murmurs Abdomen:  Non  distended No tenderness, No masses Bowel sounds normal Extremities:  No clubbing / cyanosis No pedal edema No joint deformity    Skin:  No rashes, lesions or ulcers Neurologic:  AAO x 3 CN 2-12 grossly intact Sensation intact Strength 5/5 in all 4 extremities Psychiatric:  Confused    Labs on Admission: I have personally reviewed following labs and imaging studies  CBC: Recent Labs  Lab 04/15/18 1625 04/16/18 0514 04/17/18 1002 04/22/18 0855  WBC 16.4* 18.0* 6.8 14.6*  NEUTROABS  --  13.3* 4.5  --   HGB 13.1 11.6* 11.5* 14.2  HCT 42.7 38.2* 35.2* 47.7  MCV 100.0 100.8* 91.0 101.5*  PLT 312 238 228 254   Basic Metabolic Panel: Recent Labs  Lab 04/15/18 2146 04/16/18 0120 04/16/18 0514 04/16/18 0825  04/16/18 1552 04/17/18 0009 04/17/18 0012 04/22/18 0855 04/22/18 1105  NA 143 147* 148* 147* 144  --  141 134* 142  K 5.8* 4.1 3.8 3.4* 3.1*  --  3.3* 5.2* 4.9  CL 113* 123* 126* 122* 117*  --  113* 94* 104  CO2 <7* <7* 11* 16* 21*  --  24 <7* <7*  GLUCOSE 660* 285* 144* 136* 95  --  241* 714* 681*  BUN 25* 22* _0 --  11 30* 30*  CREATININE 1.29* 1.12 0.88 0.61 0.63  --  0.57* 1.57* 1.46*  CALCIUM 8.8* 8.4* 8.1* 8.3* 7.7*  --  7.6* 8.7* 7.6*  MG 2.1 2.2 2.0  --   --  1.8  --  2.3  --   PHOS 6.1* 2.7 1.7*  --   --  1.6*  --  5.7*  --    GFR: Estimated Creatinine Clearance: 44.2 mL/min (A) (by C-G formula based on SCr of 1.46 mg/dL (H)). Liver Function Tests: Recent Labs  Lab 04/15/18 2146 04/16/18 0120 04/16/18 0514  AST  --   --  205*  ALT  --   --  168*  ALKPHOS  --   --  76  BILITOT  --   --  1.4*  PROT  --   --  6.4*  ALBUMIN 4.1 3.9 3.5   No results for input(s): LIPASE, AMYLASE in the last 168 hours. No results for input(s): AMMONIA in the last 168 hours. Coagulation Profile: No results for input(s): INR, PROTIME in the last 168 hours. Cardiac Enzymes: No results for input(s): CKTOTAL, CKMB, CKMBINDEX, TROPONINI in the last 168 hours. BNP (last 3 results) No results for input(s): PROBNP in the last 8760 hours. HbA1C: Recent Labs    04/22/18 1106  HGBA1C 14.0*   CBG: Recent Labs  Lab 04/17/18 0753 04/17/18 1125 04/22/18 0840 04/22/18 1048 04/22/18 1227  GLUCAP 214* 270* >600* 570* 548*   Lipid Profile: No results for input(s): CHOL, HDL, LDLCALC, TRIG, CHOLHDL, LDLDIRECT in the last 72 hours. Thyroid Function Tests: No results for input(s): TSH, T4TOTAL, FREET4, T3FREE, THYROIDAB in the last 72 hours. Anemia Panel: No results for input(s): VITAMINB12, FOLATE, FERRITIN, TIBC, IRON, RETICCTPCT in the last 72 hours. Urine analysis:    Component Value Date/Time   COLORURINE STRAW (A) 04/22/2018 0855   APPEARANCEUR CLEAR 04/22/2018 0855    LABSPEC 1.016 04/22/2018 0855   PHURINE 5.0 04/22/2018 0855   GLUCOSEU >=500 (A) 04/22/2018 0855   HGBUR SMALL (A) 04/22/2018 0855   BILIRUBINUR NEGATIVE 04/22/2018 0855   BILIRUBINUR Negative 02/07/2016 1528   KETONESUR 80 (A) 04/22/2018 0855   PROTEINUR NEGATIVE 04/22/2018 0855   UROBILINOGEN  0.2 02/07/2016 1528   UROBILINOGEN 0.2 03/22/2015 0913   NITRITE NEGATIVE 04/22/2018 0855   LEUKOCYTESUR NEGATIVE 04/22/2018 0855   Sepsis Labs: _0 (procalcitonin:4,lacticidven:4) ) Recent Results (from the past 240 hour(s))  MRSA PCR Screening     Status: None   Collection Time: 04/15/18  9:40 PM  Result Value Ref Range Status   MRSA by PCR NEGATIVE NEGATIVE Final    Comment:        The GeneXpert MRSA Assay (FDA approved for NASAL specimens only), is one component of a comprehensive MRSA colonization surveillance program. It is not intended to diagnose MRSA infection nor to guide or monitor treatment for MRSA infections. Performed at [facility], [place] [address] [place], [place] [address]      Radiological Exams on Admission: No results found.  EKG: Independently reviewed. Sinus tach at 139 bpm  Assessment/Plan Principal Problem:   DKA, type 1   - known for noncompliance - insulin infusion started  - a1c 14 - will ask for palliative care consults due to multiple readmits  Active Problems:     Acute renal failure/ dehydration    - receiving 3 L NS bolus and then an infusion - per Triage, he had diarrhea- follow  Lactic acidosis - cont to follow- likely will improve after IVF  Acute Metabolic encephalopathy - likely will need a sitter as he has pulled out 1 IV already- will order  Hyperkalemia - resolved on second Bmet  Hypocalcemia - check Albumin  Severe protein calorie malnutrition Body mass index is 15.59 kg/m.   DVT prophylaxis: SCDs Code Status: Full code  Family Communication:   Disposition Plan: f/u on palliative care  consult and develop a long term plan prior to discharge  Consults called: palliative care  Admission status: inpatient    Debbe Odea MD [facility] Pager: [email or web address] Password [email or web address] 7PM-7AM, please contact night-coverage   04/22/2018, 12:29 PM

## 2018-04-22 NOTE — Progress Notes (Signed)
Inpatient Diabetes Program Recommendations  AACE/ADA: New Consensus Statement on Inpatient Glycemic Control (2015)  Target Ranges:  Prepandial:   less than 140 mg/dL      Peak postprandial:   less than 180 mg/dL (1-2 hours)      Critically ill patients:  140 - 180 mg/dL   Results for [name] [name], [name] [name] (MRN [id number]) as of 04/22/2018 12:53  Ref. Range 04/22/2018 08:55  Sodium Latest Ref Range: 135 - 145 mmol/L 134 (L)  Potassium Latest Ref Range: 3.5 - 5.1 mmol/L 5.2 (H)  Chloride Latest Ref Range: 98 - 111 mmol/L 94 (L)  CO2 Latest Ref Range: 22 - 32 mmol/L <7 (L)  Glucose Latest Ref Range: 70 - 99 mg/dL 409714 (HH)  BUN Latest Ref Range: 6 - 20 mg/dL 30 (H)  Creatinine Latest Ref Range: 0.61 - 1.24 mg/dL 8.111.57 (H)  Calcium Latest Ref Range: 8.9 - 10.3 mg/dL 8.7 (L)  Anion gap Latest Ref Range: 5 - 15  NOT CALCULATED    Admit with: DKA (again)--Just left AMA on 11/27 after admission for DKA on 11/25  History: Type 1 DM  Home DM Meds: Lantus 20 units Daily       Novolog 10 units TID  Current Orders: IV Insulin Drip    Patient well known to the Inpatient Diabetes Program.  This is patient's 10th admission for DKA since January 2019 ([facility] and other health systems for 2 of those admissions).  Counseled by the DM Coordinators many times in the past about the importance of good glucose control at home.  He has been educated during each admission on DM management, insulins, etc. He lacks resources for management and likely will not be able to see endocrinologist due to lack of insurance.   [facility]  Left AMA on 11/27.    --Will follow patient during hospitalization--  Ambrose FinlandJeannine Johnston Rui Wordell RN, MSN, CDE Diabetes Coordinator Inpatient Glycemic Control Team Team Pager: [phone] (8a-5p)

## 2018-04-22 NOTE — ED Notes (Signed)
This RN just left patient's room not 5 minutes prior, when I heard the patient's heart rate alarm going off. Patient was seen to have pulled off all telemetry leads, blood pressure cuff, and oxygen saturation monitor. Patient was at sink, with IV lines pulled taught. This RN watched patient dump his urinal in the sink, then start to rinse out the urinal. When this RN asked the patient what he was doing, he said "Im thirsty. Im getting some water." This RN explained that first, the patient could not drink any water at this time due to doctor order. Second, the patient did not need to use the urinal as a cup. The patient shrugged and said "it works. Im just thirsty." Patient assisted back to bed, placed back on telemetry, BP, and saturation measuring tools. Patient instructed not to get out of bed without nurse assistance. Bed in lowest position and both bedrails are up. Patient has call light within reach and instructed to call for any and all needs. Patient verbalizes understanding. Consulting civil engineerCharge RN notified. Patient door left open for easy visibility of patient for safety.

## 2018-04-22 NOTE — ED Notes (Signed)
Date and time results received: 04/22/18 10:26 AM    Test: glucose Critical Value: 714  Name of Provider Notified: Juleen ChinaKohut MD  Orders Received? Or Actions Taken?: made aware via phone; awaiting orders

## 2018-04-22 NOTE — ED Triage Notes (Signed)
Patient BIB EMS from home with complaints of 3 day history of nausea/vomiting/weakness/polyuria/polydipsia/ and diarreha. Patient has history of type 1 diabetes, with history of DKA.  EMS administered of NS prior to PIV blowing en route. EMS VS: HR140, RR24, BP 134/96, O2 99% RA, CBG=413

## 2018-04-22 NOTE — ED Notes (Signed)
ED TO INPATIENT HANDOFF REPORT  Name/Age/Gender [name] [name] 32 y.o. male  Code Status    Code Status Orders  (From admission, onward)         Start     Ordered   04/22/18 1105  Full code  Continuous     04/22/18 1106        Code Status History    Date Active Date Inactive Code Status Order ID Comments User Context   04/15/2018 2113 04/17/2018 1617 Full Code [id number]  Barnetta Chapel, MD Inpatient   04/15/2018 2113 04/15/2018 2113 Full Code [id number]  Barnetta Chapel, MD Inpatient   01/28/2018 1138 01/29/2018 1614 Full Code [id number]  Briant Cedar, MD Inpatient   01/02/2018 1207 01/03/2018 1912 Full Code [id number]  Nyra Market, MD ED   11/30/2017 0052 12/01/2017 1346 Full Code [id number]  Eduard Clos, MD ED   09/20/2017 1144 09/22/2017 1433 Full Code [id number]  Lahoma Crocker, MD ED   08/21/2017 1146 08/23/2017 1849 Full Code [id number]  Alwyn Ren, MD ED   06/29/2017 1331 07/01/2017 1918 Full Code [id number]  Delaine Lame, MD ED   03/09/2017 1158 03/12/2017 1552 Full Code [id number]  Richarda Overlie, MD ED   10/29/2016 2127 10/30/2016 1701 Full Code [id number]  Jonah Blue, MD Inpatient   08/26/2016 1441 08/28/2016 1439 Full Code [id number]  Levie Heritage, DO Inpatient   07/11/2016 2138 07/12/2016 1951 Full Code [id number]  Michael Litter, MD Inpatient   07/11/2016 2138 07/11/2016 2138 Full Code [id number]  Michael Litter, MD Inpatient   05/29/2016 2319 05/31/2016 1759 Full Code [id number]  Therisa Doyne, MD Inpatient   03/09/2016 1135 03/11/2016 1625 Full Code [id number]  Calvert Cantor, MD ED   12/18/2015 1217 12/19/2015 1918 Full Code [id number]  Narda Bonds, MD Inpatient   12/01/2015 2310 12/03/2015 2130 Full Code [id number]  Eduard Clos, MD ED   07/22/2015 0927 07/24/2015 1812 Full Code [id number]  Otis Brace, MD ED   06/15/2015 2217 06/18/2015 1314 Full Code [id number]  Bobette Mo, MD Inpatient   06/06/2015 0200 06/07/2015 2026 Full Code  [id number]  Briscoe Deutscher, MD ED   03/22/2015 1118 03/24/2015 1701 Full Code [id number]  Otis Brace, MD ED   09/30/2014 0701 10/02/2014 1746 Full Code [id number]  Lupita Leash, MD Inpatient   08/30/2014 1521 09/03/2014 1708 Full Code [id number]  Ky Barban, MD ED   08/13/2014 1956 08/15/2014 1806 Full Code [id number]  Osvaldo Shipper, MD ED   07/17/2014 0116 07/20/2014 1604 Full Code [id number]  Yevonne Pax, MD Inpatient   07/05/2014 1040 07/08/2014 1533 Full Code [id number]  Bernadene Person, NP ED   07/01/2014 0010 07/02/2014 1404 Full Code [id number]  Yevonne Pax, MD Inpatient   04/25/2014 0835 04/28/2014 2113 Full Code [id number]  Drema Dallas, MD Inpatient   04/24/2014 0618 04/25/2014 0835 Full Code [id number]  Eduard Clos, MD Inpatient   07/07/2013 2218 07/10/2013 1738 Full Code [id number]  Elease Etienne, MD Inpatient   03/11/2012 0046 03/22/2012 2111 Full Code [id number]  Reeves Forth, RN Inpatient   03/10/2012 2009 03/11/2012 0046 Full Code [id number]  Tarry Kos, MD ED      Home/SNF/Other Home  Chief Complaint Hyperglycemia  Level of Care/Admitting Diagnosis ED Disposition    ED Disposition Condition Comment   Admit  Hospital Area: [facility] [[id number]]  Level of Care: Stepdown [14]  Admit to SDU based on following criteria: Severe physiological/psychological  symptoms:  Any diagnosis requiring assessment & intervention at least every 4 hours on an ongoing basis to obtain desired patient outcomes including stability and rehabilitation  Diagnosis: DKA (diabetic ketoacidoses) [facility]) [[id number]]  Admitting Physician: Calvert Cantor [[id number]]  Attending Physician: Calvert Cantor [[id number]]  Estimated length of stay: past midnight tomorrow  Certification:: I certify this patient will need inpatient services for at least 2 midnights  PT Class (Do Not Modify): Inpatient [101]  PT Acc Code (Do Not Modify): Private [1]       Medical History Past  Medical History:  Diagnosis Date  . ARF (acute renal failure) ([facility]) 2011   pt was on dialysis "for 1 day approx 2011, pt is no longer on dialysis (03/09/2017)  . DKA (diabetic ketoacidoses) ([facility])    "several times" (03/09/2017)  . Type I diabetes mellitus ([facility]) Dx 2012    Allergies No Known Allergies  IV Location/Drains/Wounds Patient Lines/Drains/Airways Status   Active Line/Drains/Airways    Name:   Placement date:   Placement time:   Site:   Days:   Peripheral IV 04/22/18 Right Forearm   04/22/18    0853    Forearm   less than 1          Labs/Imaging Results for orders placed or performed during the hospital encounter of 04/22/18 (from the past 48 hour(s))  CBG monitoring, ED     Status: Abnormal   Collection Time: 04/22/18  8:40 AM  Result Value Ref Range   Glucose-Capillary >600 (HH) 70 - 99 mg/dL  Basic metabolic panel     Status: Abnormal   Collection Time: 04/22/18  8:55 AM  Result Value Ref Range   Sodium 134 (L) 135 - 145 mmol/L   Potassium 5.2 (H) 3.5 - 5.1 mmol/L   Chloride 94 (L) 98 - 111 mmol/L   CO2 <7 (L) 22 - 32 mmol/L   Glucose, Bld 714 (HH) 70 - 99 mg/dL    Comment: CRITICAL RESULT CALLED TO, READ BACK BY AND VERIFIED WITH: ROSSER,M @ 1026 ON [id number] BY POTEAT,S    BUN 30 (H) 6 - 20 mg/dL   Creatinine, Ser 5.78 (H) 0.61 - 1.24 mg/dL   Calcium 8.7 (L) 8.9 - 10.3 mg/dL   GFR calc non Af Amer 57 (L) >60 mL/min   GFR calc Af Amer >60 >60 mL/min   Anion gap NOT CALCULATED 5 - 15    Comment: Performed at [facility], [address] [place], [place] [address]  CBC     Status: Abnormal   Collection Time: 04/22/18  8:55 AM  Result Value Ref Range   WBC 14.6 (H) 4.0 - 10.5 K/uL   RBC 4.70 4.22 - 5.81 MIL/uL   Hemoglobin 14.2 13.0 - 17.0 g/dL   HCT 95.2 84.1 - 32.4 %   MCV 101.5 (H) 80.0 - 100.0 fL   MCH 30.2 26.0 - 34.0 pg   MCHC 29.8 (L) 30.0 - 36.0 g/dL   RDW 40.1 02.7 - 25.3 %   Platelets 355 150 - 400 K/uL   nRBC 0.0 0.0 - 0.2  %    Comment: Performed at [facility], [address], [place], [place] [address]  Urinalysis, Routine w reflex microscopic     Status: Abnormal   Collection Time: 04/22/18  8:55 AM  Result Value Ref Range   Color, Urine STRAW (A) YELLOW   APPearance CLEAR CLEAR   Specific Gravity, Urine 1.016 1.005 - 1.030   pH  5.0 5.0 - 8.0   Glucose, UA >=500 (A) NEGATIVE mg/dL   Hgb urine dipstick SMALL (A) NEGATIVE   Bilirubin Urine NEGATIVE NEGATIVE   Ketones, ur 80 (A) NEGATIVE mg/dL   Protein, ur NEGATIVE NEGATIVE mg/dL   Nitrite NEGATIVE NEGATIVE   Leukocytes, UA NEGATIVE NEGATIVE   RBC / HPF 0-5 0 - 5 RBC/hpf   WBC, UA 0-5 0 - 5 WBC/hpf   Bacteria, UA RARE (A) NONE SEEN   Squamous Epithelial / LPF 0-5 0 - 5   Mucus PRESENT     Comment: Performed at [facility], [address], [place], [place] [address]  Magnesium     Status: None   Collection Time: 04/22/18  8:55 AM  Result Value Ref Range   Magnesium 2.3 1.7 - 2.4 mg/dL    Comment: Performed at [facility], [address], [place], [place] [address]  Phosphorus     Status: Abnormal   Collection Time: 04/22/18  8:55 AM  Result Value Ref Range   Phosphorus 5.7 (H) 2.5 - 4.6 mg/dL    Comment: Performed at [facility], [address] [place], [place] [address]  I-Stat CG4 Lactic Acid, ED     Status: Abnormal   Collection Time: 04/22/18  8:57 AM  Result Value Ref Range   Lactic Acid, Venous 5.89 (HH) 0.5 - 1.9 mmol/L   Comment NOTIFIED PHYSICIAN   Blood gas, arterial ([facility] & [facility] ONLY)     Status: Abnormal (Preliminary result)   Collection Time: 04/22/18  9:15 AM  Result Value Ref Range   FIO2 21.00    Delivery systems ROOM AIR    pH, Arterial 6.953 (LL) 7.350 - 7.450    Comment: RBV Raeford Razor, MD AT 0928 BY KENNAN DEPUE,RRT,RCP ON 04/22/2018   pCO2 arterial  BELOW REPORTABLE RANGE 32.0 - 48.0 mmHg    Comment: RBV Raeford Razor, MD AT 9147 BY KENNAN  DEPUE,RRT,RCP ON 04/22/2018   pO2, Arterial 149 (H) 83.0 - 108.0 mmHg   Bicarbonate PENDING 20.0 - 28.0 mmol/L   Acid-Base Excess PENDING 0.0 - 2.0 mmol/L   Acid-base deficit PENDING 0.0 - 2.0 mmol/L   O2 Saturation 96.3 %   Patient temperature 98.6    Collection site RIGHT RADIAL    Drawn by [id number]    Sample type ARTERIAL DRAW    Allens test (pass/fail) PASS PASS    Comment: Performed at [facility], [address] [place], [place] [address]  CBG monitoring, ED     Status: Abnormal   Collection Time: 04/22/18 10:48 AM  Result Value Ref Range   Glucose-Capillary 570 (HH) 70 - 99 mg/dL   Comment 1 Notify RN    Comment 2 Document in Chart    No results found. EKG Interpretation  Date/Time:  Monday April 22 2018 09:10:53 EST Ventricular Rate:  139 PR Interval:    QRS Duration: 63 QT Interval:  275 QTC Calculation: 419 R Axis:   84 Text Interpretation:  Sinus tachycardia Nonspecific T wave abnormality Confirmed by Raeford Razor [phone]) on 04/22/2018 9:24:10 AM   Pending Labs Unresulted Labs (From admission, onward)    Start     Ordered   04/22/18 1106  Hemoglobin A1c  Once,   R     04/22/18 1106   04/22/18 1105  Basic metabolic panel  STAT Now then every 4 hours ,   STAT     04/22/18 1106  Vitals/Pain Today's Vitals   04/22/18 0841 04/22/18 0943 04/22/18 1000 04/22/18 1100  BP: 132/82 140/87 134/84 119/83  Pulse: (!) 140 (!) 138 (!) 135 (!) 132  Resp: (!) 21 (!) 24 (!) 25 (!) 26  Temp: (!) 97.4 F (36.3 C)     TempSrc: Oral     SpO2: 100% 100% 100% 100%  Weight:      Height:        Isolation Precautions No active isolations  Medications Medications  insulin regular, human (MYXREDLIN) 100 units/ 100 mL infusion (5.1 Units/hr Intravenous New Bag/Given 04/22/18 1115)  lactated ringers infusion ( Intravenous Hold 04/22/18 1116)  0.9 %  sodium chloride infusion (has no administration in time range)  0.9 %  sodium chloride infusion  (has no administration in time range)  dextrose 5 %-0.45 % sodium chloride infusion (has no administration in time range)  insulin regular, human (MYXREDLIN) 100 units/ 100 mL infusion (has no administration in time range)  sodium chloride 0.9 % bolus 3,000 mL (0 mLs Intravenous Stopped 04/22/18 1045)  ondansetron (ZOFRAN) injection 4 mg (4 mg Intravenous Given 04/22/18 0905)  sodium bicarbonate injection 50 mEq (50 mEq Intravenous Given 04/22/18 0941)    Mobility walks with person assist

## 2018-04-22 NOTE — ED Provider Notes (Signed)
[facility] DEPT Provider Note   CSN: [id number] Arrival date & time: 04/22/18  0827     History   Chief Complaint Chief Complaint  Patient presents with  . Hyperglycemia  . Diarrhea  . Emesis    HPI [name] [name] is a 32 y.o. male.  HPI   32 year old male with about a 3-day history of relatively worsening nausea/vomiting generalized weakness.  He does endorse polyuria and polydipsia.  He reports compliance with his medications although he was just admitted with diabetic ketoacidosis within the past week and left AMA.  Clinically improved prior to leaving [facility] but he was still hyperglycemic and acidotic.  He feels like he is in DKA again.  Past Medical History:  Diagnosis Date  . ARF (acute renal failure) ([place]) 2011   pt was on dialysis "for 1 day approx 2011, pt is no longer on dialysis (03/09/2017)  . DKA (diabetic ketoacidoses) ([place])    "several times" (03/09/2017)  . Type I diabetes mellitus [facility]) Dx 2012    Patient Active Problem List   Diagnosis Date Noted  . Nausea & vomiting 11/29/2017  . Diabetes mellitus type 1, uncontrolled, with complications ([place])   . Noncompliance by refusing service   . DKA (diabetic ketoacidoses) ([place]) 06/29/2017  . DKA, type 1 ([place]) 03/09/2017  . Hypophosphatemia 08/27/2016  . AKI (acute kidney injury) ([place]) 07/11/2016  . Hyperkalemia 03/09/2016  . Leukocytosis 03/09/2016  . Diabetic ketoacidosis without coma associated with type 1 diabetes mellitus ([place])   . Non-intractable vomiting with nausea   . Chest pain 12/18/2015  . Marijuana use 07/22/2015  . Proteinuria with type 1 diabetes mellitus ([place]) 07/22/2015  . Hypertension complicating diabetes ([place]) 07/22/2015  . Uncontrolled type 1 diabetes mellitus with ketoacidosis without coma ([place])   . Hypocalcemia   . Acute renal failure ([place])   . Hypokalemia   . Hypomagnesemia   . Abnormal EKG 06/15/2015  . Sinus tachycardia   .  Tachypnea   . Tinea pedis 12/17/2014  . Adjustment disorder with mixed anxiety and depressed mood 09/03/2014  . Protein-calorie malnutrition, severe ([place]) 07/06/2014  . DM (diabetes mellitus), type 1, uncontrolled ([place]) 05/01/2014  . HLD (hyperlipidemia) 04/26/2014  . Substance abuse ([place])   . Dehydration 07/07/2013  . Fatigue 03/10/2012    Past Surgical History:  Procedure Laterality Date  . NO PAST SURGERIES          Home Medications    Prior to Admission medications   Medication Sig Start Date End Date Taking? Authorizing Provider  Blood Glucose Monitoring Suppl (TRUE METRIX METER) w/Device KIT 1 each by Does not apply route as needed. 07/12/16   Florencia Reasons, MD  insulin aspart (NOVOLOG) 100 UNIT/ML injection Inject 10 Units into the skin 3 (three) times daily before meals. 01/03/18   Ledell Noss, MD  Insulin Glargine (LANTUS) 100 UNIT/ML Solostar Pen Inject 20 Units into the skin daily. 01/03/18   Ledell Noss, MD  ondansetron (ZOFRAN ODT) 4 MG disintegrating tablet Take 1 tablet (4 mg total) by mouth every 8 (eight) hours as needed for nausea or vomiting. 03/09/18   Francine Graven, DO    Family History Family History  Problem Relation Age of Onset  . Diabetes Father   . Diabetes Maternal Grandmother   . Heart disease Neg Hx   . Hypertension Neg Hx     Social History Social History   Tobacco Use  . Smoking status: Former Smoker    Packs/day:  0.25    Years: 10.00    Pack years: 2.50    Types: Cigarettes    Last attempt to quit: 09/19/2013    Years since quitting: 4.5  . Smokeless tobacco: Never Used  Substance Use Topics  . Alcohol use: Yes    Alcohol/week: 0.0 standard drinks    Comment: 03/09/2017 "normally I don't drink; drank 3 shots liquor 2 days ago"  . Drug use: No     Allergies   Patient has no known allergies.   Review of Systems Review of Systems  All systems reviewed and negative, other than as noted in HPI.  Physical Exam Updated Vital  Signs BP 132/82 (BP Location: Right Arm)   Pulse (!) 140   Temp (!) 97.4 F (36.3 C) (Oral)   Resp (!) 21   Ht '5\' 6"'$  (1.676 m)   Wt 43.8 kg   SpO2 100%   BMI 15.59 kg/m   Physical Exam  Constitutional:  Frail/cachectic.  Ill-appearing.  HENT:  Head: Normocephalic and atraumatic.  Dry mucous membranes  Eyes: Pupils are equal, round, and reactive to light. Conjunctivae and EOM are normal. Right eye exhibits no discharge. Left eye exhibits no discharge.  Neck: Neck supple.  Cardiovascular: Regular rhythm and normal heart sounds. Exam reveals no gallop and no friction rub.  No murmur heard. Tachycardic  Pulmonary/Chest: Breath sounds normal.  Tachypnea.  Breath sounds clear.  Abdominal: Soft. He exhibits no distension. There is no tenderness.  Musculoskeletal: He exhibits no edema or tenderness.  Neurological: He is alert.  Skin: Skin is warm and dry.  Nursing note and vitals reviewed.    ED Treatments / Results  Labs (all labs ordered are listed, but only abnormal results are displayed) Labs Reviewed  BASIC METABOLIC PANEL - Abnormal; Notable for the following components:      Result Value   Sodium 134 (*)    Potassium 5.2 (*)    Chloride 94 (*)    CO2 <7 (*)    Glucose, Bld 714 (*)    BUN 30 (*)    Creatinine, Ser 1.57 (*)    Calcium 8.7 (*)    GFR calc non Af Amer 57 (*)    All other components within normal limits  CBC - Abnormal; Notable for the following components:   WBC 14.6 (*)    MCV 101.5 (*)    MCHC 29.8 (*)    All other components within normal limits  URINALYSIS, ROUTINE W REFLEX MICROSCOPIC - Abnormal; Notable for the following components:   Color, Urine STRAW (*)    Glucose, UA >=500 (*)    Hgb urine dipstick SMALL (*)    Ketones, ur 80 (*)    Bacteria, UA RARE (*)    All other components within normal limits  BLOOD GAS, ARTERIAL - Abnormal; Notable for the following components:   pH, Arterial 6.953 (*)    pO2, Arterial 149 (*)    All other  components within normal limits  PHOSPHORUS - Abnormal; Notable for the following components:   Phosphorus 5.7 (*)    All other components within normal limits  CBG MONITORING, ED - Abnormal; Notable for the following components:   Glucose-Capillary >600 (*)    All other components within normal limits  I-STAT CG4 LACTIC ACID, ED - Abnormal; Notable for the following components:   Lactic Acid, Venous 5.89 (*)    All other components within normal limits  CBG MONITORING, ED - Abnormal; Notable for the following  components:   Glucose-Capillary 570 (*)    All other components within normal limits  MAGNESIUM  I-STAT CG4 LACTIC ACID, ED    EKG EKG Interpretation  Date/Time:  Monday April 22 2018 09:10:53 EST Ventricular Rate:  139 PR Interval:    QRS Duration: 63 QT Interval:  275 QTC Calculation: 419 R Axis:   84 Text Interpretation:  Sinus tachycardia Nonspecific T wave abnormality Confirmed by Virgel Manifold [id number]) on 04/22/2018 9:24:10 AM   Radiology No results found.  Procedures Procedures (including critical care time)  CRITICAL CARE Performed by: Virgel Manifold Total critical care time: 40 minutes Critical care time was exclusive of separately billable procedures and treating other patients. Critical care was necessary to treat or prevent imminent or life-threatening deterioration. Critical care was time spent personally by me on the following activities: development of treatment plan with patient and/or surrogate as well as nursing, discussions with consultants, evaluation of patient's response to treatment, examination of patient, obtaining history from patient or surrogate, ordering and performing treatments and interventions, ordering and review of laboratory studies, ordering and review of radiographic studies, pulse oximetry and re-evaluation of patient's condition.   Medications Ordered in ED Medications  sodium chloride 0.9 % bolus 3,000 mL (has no  administration in time range)  ondansetron (ZOFRAN) injection 4 mg (has no administration in time range)     Initial Impression / Assessment and Plan / ED Course  I have reviewed the triage vital signs and the nursing notes.  Pertinent labs & imaging results that were available during my care of the patient were reviewed by me and considered in my medical decision making (see chart for details).      32 year old male with generalized weakness, nausea/vomiting, polyuria and polydipsia.  Clinically diabetic ketoacidosis.  I question his compliance with meds. Afebrile. I don't have a clear precipitent otherwise. Blood sugar greater than 600.  2 IVs placed.  Will start with fluids.  Insulin drip once labs resulted.  Electrolyte management.  Will require admission.   He is in DKA. Continue IVF. Initial K elevated. Deferred supplementation at this time. Insulin gtt. Discussed with CCM, re: profound metabolic acidosis. They will see in consultation. Their initial impression though is that this is a patient that can be appropriately managed in stepdown setting. Will discuss with medicine for admission.   Final Clinical Impressions(s) / ED Diagnoses   Final diagnoses:  Diabetic ketoacidosis without coma associated with type 1 diabetes mellitus [facility])    ED Discharge Orders    None       Virgel Manifold, MD 04/22/18 1059

## 2018-04-22 NOTE — Progress Notes (Signed)
CRITICAL VALUE ALERT  Critical Value:  Lactic 3.7 Date & Time Notied:  04/22/2018 1547  Provider Notified: Dr. Butler Denmarkizwan,  Orders Received/Actions taken: trending down

## 2018-04-23 LAB — BASIC METABOLIC PANEL
ANION GAP: 6 (ref 5–15)
Anion gap: 11 (ref 5–15)
Anion gap: 6 (ref 5–15)
Anion gap: 9 (ref 5–15)
BUN: 15 mg/dL (ref 6–20)
BUN: 19 mg/dL (ref 6–20)
BUN: 21 mg/dL — ABNORMAL HIGH (ref 6–20)
BUN: 24 mg/dL — ABNORMAL HIGH (ref 6–20)
CHLORIDE: 113 mmol/L — AB (ref 98–111)
CO2: 15 mmol/L — ABNORMAL LOW (ref 22–32)
CO2: 17 mmol/L — ABNORMAL LOW (ref 22–32)
CO2: 17 mmol/L — AB (ref 22–32)
CO2: 21 mmol/L — ABNORMAL LOW (ref 22–32)
Calcium: 6.7 mg/dL — ABNORMAL LOW (ref 8.9–10.3)
Calcium: 6.7 mg/dL — ABNORMAL LOW (ref 8.9–10.3)
Calcium: 6.8 mg/dL — ABNORMAL LOW (ref 8.9–10.3)
Calcium: 6.9 mg/dL — ABNORMAL LOW (ref 8.9–10.3)
Chloride: 109 mmol/L (ref 98–111)
Chloride: 116 mmol/L — ABNORMAL HIGH (ref 98–111)
Chloride: 120 mmol/L — ABNORMAL HIGH (ref 98–111)
Creatinine, Ser: 0.6 mg/dL — ABNORMAL LOW (ref 0.61–1.24)
Creatinine, Ser: 0.6 mg/dL — ABNORMAL LOW (ref 0.61–1.24)
Creatinine, Ser: 0.68 mg/dL (ref 0.61–1.24)
Creatinine, Ser: 0.72 mg/dL (ref 0.61–1.24)
GFR calc Af Amer: >60 mL/min (ref >60)
GFR calc Af Amer: >60 mL/min (ref >60)
GFR calc Af Amer: >60 mL/min (ref >60)
GFR calc non Af Amer: >60 mL/min (ref >60)
GFR calc non Af Amer: >60 mL/min (ref >60)
GFR calc non Af Amer: >60 mL/min (ref >60)
Glucose, Bld: 115 mg/dL — ABNORMAL HIGH (ref 70–99)
Glucose, Bld: 164 mg/dL — ABNORMAL HIGH (ref 70–99)
Glucose, Bld: 174 mg/dL — ABNORMAL HIGH (ref 70–99)
Glucose, Bld: 185 mg/dL — ABNORMAL HIGH (ref 70–99)
Potassium: 2.9 mmol/L — ABNORMAL LOW (ref 3.5–5.1)
Potassium: 3.1 mmol/L — ABNORMAL LOW (ref 3.5–5.1)
Potassium: 3.2 mmol/L — ABNORMAL LOW (ref 3.5–5.1)
Potassium: 3.2 mmol/L — ABNORMAL LOW (ref 3.5–5.1)
SODIUM: 139 mmol/L (ref 135–145)
Sodium: 136 mmol/L (ref 135–145)
Sodium: 142 mmol/L (ref 135–145)
Sodium: 143 mmol/L (ref 135–145)

## 2018-04-23 LAB — GLUCOSE, CAPILLARY
GLUCOSE-CAPILLARY: 154 mg/dL — AB (ref 70–99)
Glucose-Capillary: 105 mg/dL — ABNORMAL HIGH (ref 70–99)
Glucose-Capillary: 120 mg/dL — ABNORMAL HIGH (ref 70–99)
Glucose-Capillary: 138 mg/dL — ABNORMAL HIGH (ref 70–99)
Glucose-Capillary: 147 mg/dL — ABNORMAL HIGH (ref 70–99)
Glucose-Capillary: 153 mg/dL — ABNORMAL HIGH (ref 70–99)
Glucose-Capillary: 159 mg/dL — ABNORMAL HIGH (ref 70–99)
Glucose-Capillary: 166 mg/dL — ABNORMAL HIGH (ref 70–99)
Glucose-Capillary: 169 mg/dL — ABNORMAL HIGH (ref 70–99)
Glucose-Capillary: 174 mg/dL — ABNORMAL HIGH (ref 70–99)
Glucose-Capillary: 176 mg/dL — ABNORMAL HIGH (ref 70–99)
Glucose-Capillary: 176 mg/dL — ABNORMAL HIGH (ref 70–99)
Glucose-Capillary: 192 mg/dL — ABNORMAL HIGH (ref 70–99)
Glucose-Capillary: 195 mg/dL — ABNORMAL HIGH (ref 70–99)

## 2018-04-23 MED ORDER — ADULT MULTIVITAMIN W/MINERALS CH
1.0000 | ORAL_TABLET | Freq: Every day | ORAL | Status: DC
  Administered 2018-04-23: 1 via ORAL
  Filled 2018-04-23: qty 1

## 2018-04-23 MED ORDER — POTASSIUM CHLORIDE CRYS ER 20 MEQ PO TBCR
40.0000 meq | EXTENDED_RELEASE_TABLET | ORAL | Status: AC
  Administered 2018-04-23 (×2): 40 meq via ORAL
  Filled 2018-04-23 (×2): qty 2

## 2018-04-23 MED ORDER — INSULIN GLARGINE 100 UNIT/ML SOLOSTAR PEN: 20.0000 [IU] | PEN_INJECTOR | Freq: Every day | SUBCUTANEOUS | 3 refills | Status: DC

## 2018-04-23 MED ORDER — BOOST PLUS PO LIQD: 237.0000 mL | ORAL | Status: DC

## 2018-04-23 MED ORDER — INSULIN GLARGINE 100 UNIT/ML ~~LOC~~ SOLN
15.0000 [IU] | Freq: Every day | SUBCUTANEOUS | Status: DC
  Administered 2018-04-23: 15 [IU] via SUBCUTANEOUS
  Filled 2018-04-23: qty 0.15

## 2018-04-23 MED ORDER — INSULIN ASPART 100 UNIT/ML ~~LOC~~ SOLN
0.0000 [IU] | Freq: Three times a day (TID) | SUBCUTANEOUS | Status: DC
  Administered 2018-04-23: 3 [IU] via SUBCUTANEOUS

## 2018-04-23 MED ORDER — POTASSIUM CHLORIDE CRYS ER 20 MEQ PO TBCR
40.0000 meq | EXTENDED_RELEASE_TABLET | Freq: Once | ORAL | Status: AC
  Administered 2018-04-23: 40 meq via ORAL
  Filled 2018-04-23: qty 2

## 2018-04-23 MED ORDER — INSULIN ASPART 100 UNIT/ML ~~LOC~~ SOLN: 10.0000 [IU] | Freq: Three times a day (TID) | SUBCUTANEOUS | 3 refills | Status: DC

## 2018-04-23 MED ORDER — INSULIN ASPART 100 UNIT/ML ~~LOC~~ SOLN: 0.0000 [IU] | Freq: Every day | SUBCUTANEOUS | Status: DC

## 2018-04-23 MED ORDER — GLUCERNA SHAKE PO LIQD
237.0000 mL | Freq: Two times a day (BID) | ORAL | Status: DC
  Filled 2018-04-23: qty 237

## 2018-04-23 NOTE — Discharge Summary (Signed)
[facility]  Physician Discharge Summary   Patient ID: [name] [name] MRN: [id number] DOB/AGE: 1985-10-01 31 y.o.  Admit date: 04/22/2018 Discharge date: 04/23/2018  PCP: Charlott Rakes, MD  DISCHARGE DIAGNOSES:  Diabetic ketoacidosis, resolved Medical noncompliance  RECOMMENDATIONS FOR OUTPATIENT FOLLOW UP: 1. Patient instructed to be compliant with his medications.  DISCHARGE CONDITION: fair  Diet recommendation: Deferred carbohydrate  Filed Weights   04/22/18 0831 04/22/18 1330  Weight: 43.8 kg 48.2 kg    INITIAL HISTORY: 32 year old African-American male with a past medical history of type 1 diabetes and multiple admissions for DKA.  This was his 8 admission this year.  Patient presented with nausea vomiting weakness polydipsia polyuria.  He was found to be in diabetic ketoacidosis.  He was encephalopathic.  He was hospitalized for further management.   HOSPITAL COURSE:   Diabetic ketoacidosis in the setting of type 1 diabetes Patient is known to be noncompliant.  No clear reason for his DKA was found.  UA was unremarkable.  He does not have any respiratory symptoms.  He was afebrile.  So most likely this occurred due to noncompliance although patient mentioned that he was taking his insulin.  He was not really forthcoming regarding his compliance.  His HbA1c is 14.  Patient was placed on IV insulin.  Diabetic ketoacidosis corrected.  Anion gap is closed.  Potassium will be repleted.  He has tolerated his diet.  He is ambulated in the hallway.  He is okay for discharge home today.  Acute renal failure and dehydration Resolved with IV fluids.  Lactic acidosis Most likely related to the DKA.  Acute metabolic encephalopathy Secondary to DKA.  Mental status is now back to normal.  Severe protein calorie malnutrition Body mass index is 15.59 kg/m.  Overall stable.  Okay for discharge home today.     PERTINENT LABS:  The results of significant diagnostics  from this hospitalization (including imaging, microbiology, ancillary and laboratory) are listed below for reference.    Microbiology: Recent Results (from the past 240 hour(s))  MRSA PCR Screening     Status: None   Collection Time: 04/15/18  9:40 PM  Result Value Ref Range Status   MRSA by PCR NEGATIVE NEGATIVE Final    Comment:        The GeneXpert MRSA Assay (FDA approved for NASAL specimens only), is one component of a comprehensive MRSA colonization surveillance program. It is not intended to diagnose MRSA infection nor to guide or monitor treatment for MRSA infections. Performed at [facility], [place] [address], [place], [place] [address]      Labs: Basic Metabolic Panel: Recent Labs  Lab 04/17/18 0009  04/22/18 0855  04/22/18 1848 04/22/18 2334 04/23/18 0305 04/23/18 0640 04/23/18 1350  NA  --    < > 134*   < > 146* 143 142 139 136  K  --    < > 5.2*   < > 3.3* 3.2* 3.2* 2.9* 3.1*  CL  --    < > 94*   < > 120* 120* 116* 113* 109  CO2  --    < > <7*   < > 11* 17* 15* 17* 21*  GLUCOSE  --    < > 714*   < > 180* 115* 174* 185* 164*  BUN  --    < > 30*   < > 25* 24* 21* 19 15  CREATININE  --    < > 1.57*   < > 1.01  0.72 0.68 0.60* 0.60*  CALCIUM  --    < > 8.7*   < > 7.1* 6.7* 6.8* 6.7* 6.9*  MG 1.8  --  2.3  --   --   --   --   --   --   PHOS 1.6*  --  5.7*  --   --   --   --   --   --    < > = values in this interval not displayed.   Liver Function Tests: Recent Labs  Lab 04/22/18 1105  ALBUMIN 3.8   CBC: Recent Labs  Lab 04/17/18 1002 04/22/18 0855  WBC 6.8 14.6*  NEUTROABS 4.5  --   HGB 11.5* 14.2  HCT 35.2* 47.7  MCV 91.0 101.5*  PLT 228 355    CBG: Recent Labs  Lab 04/23/18 0801 04/23/18 0827 04/23/18 0920 04/23/18 1022 04/23/18 1126  GLUCAP 154* 138* 174* 192* 159*     IMAGING STUDIES No results found.  DISCHARGE EXAMINATION: Vitals:   04/23/18 0030 04/23/18 0426 04/23/18 0800 04/23/18 1213  BP:   (!)  137/96 119/83  Pulse:      Resp:   13 19  Temp: 98.3 F (36.8 C) 98 F (36.7 C) 98.3 F (36.8 C) 97.8 F (36.6 C)  TempSrc: Oral Oral Oral Oral  SpO2:    100%  Weight:      Height:       General appearance: alert, cooperative, appears stated age and no distress Resp: clear to auscultation bilaterally Cardio: regular rate and rhythm, S1, S2 normal, no murmur, click, rub or gallop GI: soft, non-tender; bowel sounds normal; no masses,  no organomegaly  DISPOSITION: Home  Discharge Instructions    Call MD for:  difficulty breathing, headache or visual disturbances   Complete by:  As directed    Call MD for:  extreme fatigue   Complete by:  As directed    Call MD for:  persistant dizziness or light-headedness   Complete by:  As directed    Call MD for:  persistant nausea and vomiting   Complete by:  As directed    Call MD for:  severe uncontrolled pain   Complete by:  As directed    Call MD for:  temperature >100.4   Complete by:  As directed    Diet Carb Modified   Complete by:  As directed    Discharge instructions   Complete by:  As directed    Please follow up with your PCP. Take your medications as prescribed. Do not miss your insulin doses.  You were cared for by a hospitalist during your hospital stay. If you have any questions about your discharge medications or the care you received while you were in the hospital after you are discharged, you can call the unit and asked to speak with the hospitalist on call if the hospitalist that took care of you is not available. Once you are discharged, your primary care physician will handle any further medical issues. Please note that NO REFILLS for any discharge medications will be authorized once you are discharged, as it is imperative that you return to your primary care physician (or establish a relationship with a primary care physician if you do not have one) for your aftercare needs so that they can reassess your need for  medications and monitor your lab values. If you do not have a primary care physician, you can call [phone] for a physician referral.   Increase  activity slowly   Complete by:  As directed         Allergies as of 04/23/2018   No Known Allergies     Medication List    STOP taking these medications   ondansetron 4 MG disintegrating tablet Commonly known as:  ZOFRAN-ODT   TRUE METRIX METER w/Device Kit     TAKE these medications   insulin aspart 100 UNIT/ML injection Commonly known as:  novoLOG Inject 10 Units into the skin 3 (three) times daily before meals.   Insulin Glargine 100 UNIT/ML Solostar Pen Commonly known as:  LANTUS Inject 20 Units into the skin daily.        Follow-up Information    Charlott Rakes, MD. Go on 04/24/2018.   Specialty:  Family Medicine Why:  10:00am Contact information: [place] [address] [phone]           TOTAL DISCHARGE TIME: 35 minutes  Bonnielee Haff  [facility] Pager [phone]  04/23/2018, 3:34 PM

## 2018-04-23 NOTE — Progress Notes (Signed)
Pt given discharge instructions with understanding. Pt has no questions at this time. Prescriptions given to pt and made aware of apportionment tomorrow.

## 2018-04-23 NOTE — Progress Notes (Signed)
Initial Nutrition Assessment  DOCUMENTATION CODES:   Severe malnutrition in context of chronic illness, Underweight  INTERVENTION:  - Will order Glucerna Shake BID, each supplement provides 220 kcal and 10 grams of protein. - Will order Boost Plus once/day, this supplement provides 360 kcal and 14 grams of protein. - Will order daily multivitamin with minerals. - Continue to encourage PO intakes. - Will monitor for appropriateness of DM diet education (patient has been educated and also declined further education in the past).   NUTRITION DIAGNOSIS:   Severe Malnutrition related to chronic illness(uncontrolled DM with multiple admissions for DKA) as evidenced by moderate fat depletion, severe fat depletion, moderate muscle depletion, severe muscle depletion.  GOAL:   Patient will meet greater than or equal to 90% of their needs  MONITOR:   PO intake, Supplement acceptance, Weight trends, Labs  REASON FOR ASSESSMENT:   Other (Comment)(underweight BMI)  ASSESSMENT:   32 y.o. male with medical history of Type 1 DM and multiple admissions for DKA (this is the 8th one this year).  Patient again admitted for DKA after presenting to the ED for 3 days of N/V/D, weakness, polydipsia, and polyuria. Palliative Care being consulted this admission.   No intakes documented since admission. Breakfast tray on bedside table and patient had consumed ~25% of the meal (scrambled eggs and oatmeal). Patient denies abdominal pain or nausea at this time. Patient states that appetite has been "on and off" for the past 1 month and has worsened over the past 2 weeks. He states that some days he is able to eat really well and other days he is unable to eat d/t not feeling well. He was drinking Glucerna Shakes PTA, usually BID. Patient likes Glucerna and Boost but does not care for Ensure.  He states that in the past he has had a lot of abdominal discomfort and vomiting associated with DKA, but this time he  has not had these symptoms as much and has more so been experiencing increased HR and difficulty breathing.   Per chart review, current weight is 106 lb and weight on 9/9 was also 106 lb. Will continue to monitor weight trends closely.   Medications reviewed; sliding scale Novolog, 15 units Lantus/day, 40 mEq K-Dur x2 doses today. Labs reviewed; HgbA1c: 14%, CBGs: 138-195 mg/dL today, K: 2.9 mmol/L, Cl: 113 mmol/L, creatinine: 0.6 mg/dL, Ca: 6.7 mg/dL. IVF; D5-1/2 NS @ 125 mL/hr (510 kcal).    NUTRITION - FOCUSED PHYSICAL EXAM:    Most Recent Value  Orbital Region  Mild depletion  Upper Arm Region  Severe depletion  Thoracic and Lumbar Region  Unable to assess  Buccal Region  Moderate depletion  Temple Region  Mild depletion  Clavicle Bone Region  Severe depletion  Clavicle and Acromion Bone Region  Severe depletion  Scapular Bone Region  Unable to assess  Dorsal Hand  Moderate depletion  Patellar Region  Severe depletion  Anterior Thigh Region  Severe depletion  Posterior Calf Region  Severe depletion  Edema (RD Assessment)  None  Hair  Reviewed  Eyes  Reviewed  Mouth  Reviewed  Skin  Reviewed  Nails  Reviewed       Diet Order:   Diet Order            Diet Carb Modified Fluid consistency: Thin; Room service appropriate? Yes  Diet effective now              EDUCATION NEEDS:   Not appropriate for education at this  time  Skin:  Skin Assessment: Reviewed RN Assessment  Last BM:  12/2  Height:   Ht Readings from Last 1 Encounters:  04/22/18 5\' 6"  (1.676 m)    Weight:   Wt Readings from Last 1 Encounters:  04/22/18 48.2 kg    Ideal Body Weight:  64.54 kg  BMI:  Body mass index is 17.15 kg/m.  Estimated Nutritional Needs:   Kcal:  1690-1930 kcal  Protein:  72-88 grams  Fluid:  >/= 1.8 L/day     Trenton Gammon, MS, RD, LDN, [facility] Inpatient Clinical Dietitian Pager # [phone] After hours/weekend pager # [phone]

## 2018-04-23 NOTE — Care Management Note (Signed)
Case Management Note  Patient Details  Name: [name] [name] MRN: [id number] Date of Birth: 05-06-1986  Subjective/Objective:                  Discharged  Action/Plan: Discharged to home with self-care, orders checked for hhc needs. No CM needs present at time of discharge.  Patient is able to arrangement own appointments and home care.  Expected Discharge Date:  04/23/18               Expected Discharge Plan:  Home/Self Care  In-House Referral:     Discharge planning Services  CM Consult  Post Acute Care Choice:    Choice offered to:     DME Arranged:    DME Agency:     HH Arranged:    HH Agency:     Status of Service:  Completed, signed off  If discussed at [employer] Length of Stay Meetings, dates discussed:    Additional Comments:  Golda AcreDavis, Rhonda Lynn, RN 04/23/2018, 2:58 PM

## 2018-04-23 NOTE — Discharge Instructions (Signed)
.  rxavs 

## 2018-04-24 ENCOUNTER — Inpatient Hospital Stay: Payer: Self-pay | Admitting: Family Medicine

## 2018-04-24 ENCOUNTER — Telehealth: Payer: Self-pay

## 2018-04-24 NOTE — Telephone Encounter (Signed)
Transition Care Management Follow-up Telephone Call  Date of discharge and from where:   04/23/2018[facility]   How have you been since you were released from the hospital? He stated that he is doing " okay."   Any questions or concerns? No concerns reported at this time.    He said that he has been denied medicaid. He hopes to get insurance through his employer and would like to have the 24 hour continuous blood glucose meter if it was covered by his insurance.  He said that he has been trying to work and has been able to afford his medication co-pays so far.  [facility] counselor and he has the opportunity to apply for the [employer] Card/Blue Card and [employer] Financial Assistance.    Items Reviewed:  Did the pt receive and understand the discharge instructions provided? yes.   Medications obtained and verified? He said that he has not picked up any new medications and will need to call [facility] to make sure that they are ready for pick up. He said that he has both the lantus and novolog at home. He said that he has been taking lantus 15 units twice daily, not 20 units daily as noted on the AVS.  He also said that he is taking novolog per sliding scale after meals, not 10 units three times daily as noted on the AVS. He said that was how he was instructed to take the medicaiton at home prior to his hospitalization. Informed him that Dr Alvis LemmingsNewlin would be notified.   Any new allergies since your discharge? None reported   Do you have support at home? His mother is with him  Other (ie: DME, Home Health, etc) has glucometer, no home health ordered  Functional Questionnaire: (I = Independent and D = Dependent) ADL's: independent   Follow up appointments reviewed:    PCP Hospital f/u appt confirmed? He said that he realized he missed his appointment today. He was just discharged from the hospital yesterday.  An appointment had been rescheduled for him for  05/08/18 @ 0910 with [facility].  Specialist Hospital f/u appt confirmed?no specialist recommendations noted on AVS.  Are transportation arrangements needed? He said that he can get a ride  If their condition worsens, is the pt aware to call  their PCP or go to the ED? Yes and he said that he is really trying to stay away from the ED.  Was the patient provided with contact information for the PCP's office or ED?  yes, he has the clinic number.   Was the pt encouraged to call back with questions or concerns? yes   The available appointment is outside of the 14 day TCM period.

## 2018-04-25 MED FILL — $LANTUS SOLOSTAR 100 UNITS/: 100 | 90 days supply | Qty: 18 | Fill #1

## 2018-04-25 MED FILL — NovoLOG 100 UNIT/ML SOLN: 100 | 33 days supply | Qty: 10 | Fill #1

## 2018-05-02 NOTE — Progress Notes (Deleted)
Patient ID: [name] [name], male   DOB: 16-Sep-1985, 32 y.o.   MRN: [id number]  After 8th hospitalization this year for DKA 12/2-12/07/2017.    From D/C summary: INITIAL HISTORY: 32 year old African-American male with a past medical history of type 1 diabetes and multiple admissions for DKA.  This was his 8 admission this year.  Patient presented with nausea vomiting weakness polydipsia polyuria.  He was found to be in diabetic ketoacidosis.  He was encephalopathic.  He was hospitalized for further management.   HOSPITAL COURSE:   Diabetic ketoacidosis in the setting of type 1 diabetes Patient is known to be noncompliant.  No clear reason for his DKA was found.  UA was unremarkable.  He does not have any respiratory symptoms.  He was afebrile.  So most likely this occurred due to noncompliance although patient mentioned that he was taking his insulin.  He was not really forthcoming regarding his compliance.  His HbA1c is 14.  Patient was placed on IV insulin.  Diabetic ketoacidosis corrected.  Anion gap is closed.  Potassium will be repleted.  He has tolerated his diet.  He is ambulated in the hallway.  He is okay for discharge home today.  Acute renal failure and dehydration Resolved with IV fluids.  Lactic acidosis Most likely related to the DKA.  Acute metabolic encephalopathy Secondary to DKA.  Mental status is now back to normal.  Severe protein calorie malnutrition Body mass index is 15.59 kg/m.

## 2018-05-08 ENCOUNTER — Inpatient Hospital Stay: Payer: Self-pay

## 2018-05-25 ENCOUNTER — Other Ambulatory Visit: Payer: Self-pay

## 2018-05-25 ENCOUNTER — Inpatient Hospital Stay (HOSPITAL_COMMUNITY)
Admission: EM | Admit: 2018-05-25 | Discharge: 2018-05-27 | DRG: 637 | Disposition: A | Discharge status: Home / Self Care | Payer: Self-pay | Attending: Internal Medicine | Admitting: Internal Medicine

## 2018-05-25 ENCOUNTER — Emergency Department (HOSPITAL_COMMUNITY): Payer: Self-pay

## 2018-05-25 ENCOUNTER — Encounter (HOSPITAL_COMMUNITY): Payer: Self-pay | Admitting: Emergency Medicine

## 2018-05-25 DIAGNOSIS — E1065 Type 1 diabetes mellitus with hyperglycemia: Secondary | ICD-10-CM

## 2018-05-25 DIAGNOSIS — E872 Acidosis, unspecified: Secondary | ICD-10-CM

## 2018-05-25 DIAGNOSIS — N179 Acute kidney failure, unspecified: Secondary | ICD-10-CM | POA: Diagnosis present

## 2018-05-25 DIAGNOSIS — E860 Dehydration: Secondary | ICD-10-CM | POA: Diagnosis present

## 2018-05-25 DIAGNOSIS — E10649 Type 1 diabetes mellitus with hypoglycemia without coma: Secondary | ICD-10-CM | POA: Diagnosis present

## 2018-05-25 DIAGNOSIS — E876 Hypokalemia: Secondary | ICD-10-CM | POA: Diagnosis present

## 2018-05-25 DIAGNOSIS — F4323 Adjustment disorder with mixed anxiety and depressed mood: Secondary | ICD-10-CM | POA: Diagnosis present

## 2018-05-25 DIAGNOSIS — Z681 Body mass index (BMI) 19 or less, adult: Secondary | ICD-10-CM

## 2018-05-25 DIAGNOSIS — E0810 Diabetes mellitus due to underlying condition with ketoacidosis without coma: Secondary | ICD-10-CM

## 2018-05-25 DIAGNOSIS — Z833 Family history of diabetes mellitus: Secondary | ICD-10-CM

## 2018-05-25 DIAGNOSIS — D72829 Elevated white blood cell count, unspecified: Secondary | ICD-10-CM

## 2018-05-25 DIAGNOSIS — E1069 Type 1 diabetes mellitus with other specified complication: Secondary | ICD-10-CM

## 2018-05-25 DIAGNOSIS — E1110 Type 2 diabetes mellitus with ketoacidosis without coma: Secondary | ICD-10-CM | POA: Diagnosis present

## 2018-05-25 DIAGNOSIS — E1010 Type 1 diabetes mellitus with ketoacidosis without coma: Principal | ICD-10-CM | POA: Diagnosis present

## 2018-05-25 DIAGNOSIS — R000 Tachycardia, unspecified: Secondary | ICD-10-CM | POA: Diagnosis present

## 2018-05-25 DIAGNOSIS — E43 Unspecified severe protein-calorie malnutrition: Secondary | ICD-10-CM | POA: Diagnosis present

## 2018-05-25 DIAGNOSIS — IMO0002 Reserved for concepts with insufficient information to code with codable children: Secondary | ICD-10-CM | POA: Diagnosis present

## 2018-05-25 DIAGNOSIS — Z794 Long term (current) use of insulin: Secondary | ICD-10-CM

## 2018-05-25 DIAGNOSIS — E875 Hyperkalemia: Secondary | ICD-10-CM | POA: Diagnosis present

## 2018-05-25 DIAGNOSIS — Z87891 Personal history of nicotine dependence: Secondary | ICD-10-CM

## 2018-05-25 LAB — CBG MONITORING, ED
GLUCOSE-CAPILLARY: 293 mg/dL — AB (ref 70–99)
Glucose-Capillary: 586 mg/dL (ref 70–99)
Glucose-Capillary: 220 mg/dL — ABNORMAL HIGH (ref 70–99)
Glucose-Capillary: 169 mg/dL — ABNORMAL HIGH (ref 70–99)
Glucose-Capillary: 139 mg/dL — ABNORMAL HIGH (ref 70–99)
Glucose-Capillary: 137 mg/dL — ABNORMAL HIGH (ref 70–99)

## 2018-05-25 LAB — URINALYSIS, ROUTINE W REFLEX MICROSCOPIC
Bacteria, UA: NONE SEEN
Bilirubin Urine: NEGATIVE
Glucose, UA: >=500 mg/dL — AB
Ketones, ur: 80 mg/dL — AB
Leukocytes, UA: NEGATIVE
Nitrite: NEGATIVE
Protein, ur: NEGATIVE mg/dL
Specific Gravity, Urine: 1.018 (ref 1.005–1.030)
pH: 5 (ref 5.0–8.0)

## 2018-05-25 LAB — COMPREHENSIVE METABOLIC PANEL
ALT: 81 U/L — ABNORMAL HIGH (ref 0–44)
AST: 57 U/L — ABNORMAL HIGH (ref 15–41)
Albumin: 3.7 g/dL (ref 3.5–5.0)
Alkaline Phosphatase: 87 U/L (ref 38–126)
Anion gap: 22 — ABNORMAL HIGH (ref 5–15)
BILIRUBIN TOTAL: 2.5 mg/dL — AB (ref 0.3–1.2)
BUN: 20 mg/dL (ref 6–20)
CALCIUM: 8.4 mg/dL — AB (ref 8.9–10.3)
CO2: 7 mmol/L — ABNORMAL LOW (ref 22–32)
Chloride: 109 mmol/L (ref 98–111)
Creatinine, Ser: 1.63 mg/dL — ABNORMAL HIGH (ref 0.61–1.24)
GFR calc Af Amer: >60 mL/min (ref >60)
GFR calc non Af Amer: 54 mL/min — ABNORMAL LOW (ref >60)
Glucose, Bld: 445 mg/dL — ABNORMAL HIGH (ref 70–99)
Potassium: 4.7 mmol/L (ref 3.5–5.1)
Sodium: 138 mmol/L (ref 135–145)
TOTAL PROTEIN: 6.5 g/dL (ref 6.5–8.1)

## 2018-05-25 LAB — BASIC METABOLIC PANEL
Anion gap: 7 (ref 5–15)
BUN: 13 mg/dL (ref 6–20)
CO2: 18 mmol/L — ABNORMAL LOW (ref 22–32)
CREATININE: 0.89 mg/dL (ref 0.61–1.24)
Calcium: 7.6 mg/dL — ABNORMAL LOW (ref 8.9–10.3)
Chloride: 108 mmol/L (ref 98–111)
GFR calc Af Amer: >60 mL/min (ref >60)
GFR calc non Af Amer: >60 mL/min (ref >60)
GLUCOSE: 149 mg/dL — AB (ref 70–99)
Potassium: 3.5 mmol/L (ref 3.5–5.1)
SODIUM: 133 mmol/L — AB (ref 135–145)

## 2018-05-25 LAB — I-STAT CHEM 8, ED
BUN: 34 mg/dL — ABNORMAL HIGH (ref 6–20)
BUN: 23 mg/dL — ABNORMAL HIGH (ref 6–20)
CREATININE: 0.8 mg/dL (ref 0.61–1.24)
Calcium, Ion: 1.26 mmol/L (ref 1.15–1.40)
Calcium, Ion: 1.16 mmol/L (ref 1.15–1.40)
Chloride: 111 mmol/L (ref 98–111)
Chloride: 113 mmol/L — ABNORMAL HIGH (ref 98–111)
Creatinine, Ser: 0.6 mg/dL — ABNORMAL LOW (ref 0.61–1.24)
GLUCOSE: 677 mg/dL — AB (ref 70–99)
Glucose, Bld: 311 mg/dL — ABNORMAL HIGH (ref 70–99)
HCT: 45 % (ref 39.0–52.0)
HCT: 33 % — ABNORMAL LOW (ref 39.0–52.0)
Hemoglobin: 15.3 g/dL (ref 13.0–17.0)
Hemoglobin: 11.2 g/dL — ABNORMAL LOW (ref 13.0–17.0)
POTASSIUM: 4.3 mmol/L (ref 3.5–5.1)
Potassium: 5.8 mmol/L — ABNORMAL HIGH (ref 3.5–5.1)
SODIUM: 139 mmol/L (ref 135–145)
Sodium: 134 mmol/L — ABNORMAL LOW (ref 135–145)
TCO2: 7 mmol/L — ABNORMAL LOW (ref 22–32)
TCO2: 13 mmol/L — ABNORMAL LOW (ref 22–32)

## 2018-05-25 LAB — I-STAT VENOUS BLOOD GAS, ED
Acid-base deficit: 25 mmol/L — ABNORMAL HIGH (ref 0.0–2.0)
Acid-base deficit: 16 mmol/L — ABNORMAL HIGH (ref 0.0–2.0)
Bicarbonate: 4.2 mmol/L — ABNORMAL LOW (ref 20.0–28.0)
Bicarbonate: 10.1 mmol/L — ABNORMAL LOW (ref 20.0–28.0)
O2 Saturation: 76 %
O2 Saturation: 92 %
PCO2 VEN: 23.5 mmHg — AB (ref 44.0–60.0)
PO2 VEN: 74 mmHg — AB (ref 32.0–45.0)
TCO2: <5 mmol/L — ABNORMAL LOW (ref 22–32)
TCO2: 11 mmol/L — ABNORMAL LOW (ref 22–32)
pCO2, Ven: 16.2 mmHg — CL (ref 44.0–60.0)
pH, Ven: 7.025 — CL (ref 7.250–7.430)
pH, Ven: 7.243 — ABNORMAL LOW (ref 7.250–7.430)
pO2, Ven: 58 mmHg — ABNORMAL HIGH (ref 32.0–45.0)

## 2018-05-25 LAB — CBC
HCT: 44.3 % (ref 39.0–52.0)
Hemoglobin: 13.2 g/dL (ref 13.0–17.0)
MCH: 31.4 pg (ref 26.0–34.0)
MCHC: 29.8 g/dL — ABNORMAL LOW (ref 30.0–36.0)
MCV: 105.5 fL — ABNORMAL HIGH (ref 80.0–100.0)
Platelets: 370 10*3/uL (ref 150–400)
RBC: 4.2 MIL/uL — ABNORMAL LOW (ref 4.22–5.81)
RDW: 14.4 % (ref 11.5–15.5)
WBC: 24.8 10*3/uL — AB (ref 4.0–10.5)
nRBC: 0 % (ref 0.0–0.2)

## 2018-05-25 LAB — BETA-HYDROXYBUTYRIC ACID: Beta-Hydroxybutyric Acid: 7.18 mmol/L — ABNORMAL HIGH (ref 0.05–0.27)

## 2018-05-25 LAB — GLUCOSE, CAPILLARY
GLUCOSE-CAPILLARY: 125 mg/dL — AB (ref 70–99)
Glucose-Capillary: 137 mg/dL — ABNORMAL HIGH (ref 70–99)

## 2018-05-25 LAB — I-STAT CG4 LACTIC ACID, ED
LACTIC ACID, VENOUS: 7.13 mmol/L — AB (ref 0.5–1.9)
Lactic Acid, Venous: 5.35 mmol/L (ref 0.5–1.9)

## 2018-05-25 LAB — HEMOGLOBIN A1C
HEMOGLOBIN A1C: 12.1 % — AB (ref 4.8–5.6)
Mean Plasma Glucose: 300.57 mg/dL

## 2018-05-25 MED ORDER — ACETAMINOPHEN 325 MG PO TABS
650.0000 mg | ORAL_TABLET | Freq: Four times a day (QID) | ORAL | Status: DC | PRN
  Administered 2018-05-25: 650 mg via ORAL
  Filled 2018-05-25: qty 2

## 2018-05-25 MED ORDER — ONDANSETRON HCL 4 MG/2ML IJ SOLN
4.0000 mg | Freq: Four times a day (QID) | INTRAMUSCULAR | Status: DC | PRN
  Administered 2018-05-25: 4 mg via INTRAVENOUS
  Filled 2018-05-25: qty 2

## 2018-05-25 MED ORDER — POTASSIUM CHLORIDE 10 MEQ/100ML IV SOLN
10.0000 meq | INTRAVENOUS | Status: AC
  Administered 2018-05-25 (×2): 10 meq via INTRAVENOUS
  Filled 2018-05-25 (×2): qty 100

## 2018-05-25 MED ORDER — POTASSIUM CHLORIDE 10 MEQ/100ML IV SOLN: 10.0000 meq | INTRAVENOUS | Status: DC

## 2018-05-25 MED ORDER — SODIUM CHLORIDE 0.9 % IV SOLN
1.0000 g | Freq: Once | INTRAVENOUS | Status: AC
  Administered 2018-05-25: 1 g via INTRAVENOUS
  Filled 2018-05-25: qty 10

## 2018-05-25 MED ORDER — INSULIN ASPART 100 UNIT/ML ~~LOC~~ SOLN
0.0000 [IU] | SUBCUTANEOUS | Status: DC
  Administered 2018-05-26: 3 [IU] via SUBCUTANEOUS
  Administered 2018-05-26: 1 [IU] via SUBCUTANEOUS

## 2018-05-25 MED ORDER — PNEUMOCOCCAL VAC POLYVALENT 25 MCG/0.5ML IJ INJ
0.5000 mL | INJECTION | INTRAMUSCULAR | Status: DC
  Filled 2018-05-25: qty 0.5

## 2018-05-25 MED ORDER — LACTATED RINGERS IV BOLUS
1000.0000 mL | Freq: Once | INTRAVENOUS | Status: AC
  Administered 2018-05-25: 1000 mL via INTRAVENOUS

## 2018-05-25 MED ORDER — LACTATED RINGERS IV SOLN: INTRAVENOUS | Status: DC

## 2018-05-25 MED ORDER — DEXTROSE-NACL 5-0.45 % IV SOLN
INTRAVENOUS | Status: DC
  Administered 2018-05-25: 19:00:00 via INTRAVENOUS

## 2018-05-25 MED ORDER — INSULIN REGULAR(HUMAN) IN NACL 100-0.9 UT/100ML-% IV SOLN
INTRAVENOUS | Status: DC
  Administered 2018-05-25: 2.3 [IU]/h via INTRAVENOUS
  Filled 2018-05-25: qty 100

## 2018-05-25 MED ORDER — INSULIN GLARGINE 100 UNIT/ML ~~LOC~~ SOLN
15.0000 [IU] | Freq: Every day | SUBCUTANEOUS | Status: DC
  Administered 2018-05-26: 15 [IU] via SUBCUTANEOUS
  Filled 2018-05-25 (×2): qty 0.15

## 2018-05-25 MED ORDER — ENOXAPARIN SODIUM 40 MG/0.4ML ~~LOC~~ SOLN
40.0000 mg | SUBCUTANEOUS | Status: DC
  Administered 2018-05-26: 40 mg via SUBCUTANEOUS
  Filled 2018-05-25 (×2): qty 0.4

## 2018-05-25 MED ORDER — SODIUM CHLORIDE 0.9 % IV SOLN
INTRAVENOUS | Status: DC
  Administered 2018-05-25: 21:00:00 via INTRAVENOUS

## 2018-05-25 NOTE — ED Notes (Signed)
Admitting at bedside 

## 2018-05-25 NOTE — ED Notes (Signed)
I Stat Lac Acid result of 7.13, reported to Dr. Michiel Sites

## 2018-05-25 NOTE — ED Notes (Signed)
I Stat VBG result of pH 7.025, PCO2 16.2, and I Stat Chem 8 Glu 677 reported to Dr. Michiel Sites.

## 2018-05-25 NOTE — Progress Notes (Signed)
[facility]  Seen briefly, discussed with Dr. Katrinka Blazing.  Briefly, diabetic with known DKA episodes in the past admitted with the same.  On my exam he is awake and alert, normal vital signs.  Has received crystalloid and IV insulin.  Does not need ICU bed, would admit to medicine.  PCCM available prn  Heber Paragould, MD [facility] Pager: [phone] Cell: [phone] If no response, call [phone]

## 2018-05-25 NOTE — ED Notes (Signed)
Lab called to reports that blood was hemolyzed, another blood draw requested and instructed not to use previous results  Blood drawn and sent to lab

## 2018-05-25 NOTE — H&P (Signed)
History and Physical    [name] [name] ZOX:[id number] DOB: 1985-09-22 DOA: 05/25/2018  PCP: Hoy RegisterNewlin, Enobong, MD   Patient coming from: Home    Chief Complaint: Weakness, altered mental status  HPI: [name] Mastnthony Dross is a 33 y.o. male with medical history significant of diabetes type 1, depression/adjustment disorder who presents from home with complaints of generalized weakness.    He has been admitted  in the past for DKA.  Her mother was also concerned about his mental status.  Patient states he was feeling very weak since last one and half days.He had his frequency of  Urination. Also complained of nausea and abdominal pain at home.  Patient states he was taking his insulin as usual.  He took his Lantus and NovoLog earlier today too.HE denies any current medical illness. Patient seen and examined the bedside in the emergency department.  Currently he is hemodynamically stable.  Denies any chest pain, shortness of breath, diarrhea, fever, chills, headache or dysuria.  Nausea is better now.  Does not complain of any abdominal pain.  He is alert and oriented. Patient follows with his PCP for the management of his diabetes.  ED Course: Found to have pH of 7.025 on presentation.  He was hyperkalemic with potassium was 5.8 on arrival.  Glucose of 677.  Also found to have acute kidney injury.  Lactic acid of 7.1.  WBC count of 24,000.  Urine showed ketones.  Elevated beta hydroxybutyric acid.  On DKA protocol.  On insulin drip.  Also given a dose of ceftriaxone by ED.     Past Medical History:  Diagnosis Date  . ARF (acute renal failure) (HCC) 2011   pt was on dialysis "for 1 day approx 2011, pt is no longer on dialysis (03/09/2017)  . DKA (diabetic ketoacidoses) (HCC)    "several times" (03/09/2017)  . Type I diabetes mellitus (HCC) Dx 2012    Past Surgical History:  Procedure Laterality Date  . NO PAST SURGERIES       reports that he quit smoking about 4 years ago. His smoking use included  cigarettes. He has a 2.50 pack-year smoking history. He has never used smokeless tobacco. He reports current alcohol use. He reports that he does not use drugs.  No Known Allergies  Family History  Problem Relation Age of Onset  . Diabetes Father   . Diabetes Maternal Grandmother   . Heart disease Neg Hx   . Hypertension Neg Hx      Prior to Admission medications   Medication Sig Start Date End Date Taking? Authorizing Provider  insulin aspart (NOVOLOG) 100 UNIT/ML injection Inject 10 Units into the skin 3 (three) times daily before meals. Patient taking differently: Inject 8-10 Units into the skin 3 (three) times daily before meals. Sliding scale 04/23/18  Yes Osvaldo ShipperKrishnan, Gokul, MD  Insulin Glargine (LANTUS) 100 UNIT/ML Solostar Pen Inject 20 Units into the skin daily. 04/23/18  Yes Osvaldo ShipperKrishnan, Gokul, MD    Physical Exam: Vitals:   05/25/18 1630 05/25/18 1645 05/25/18 1700 05/25/18 1715  BP: (!) 143/104 (!) 146/98 (!) 141/112 (!) 143/110  Pulse: (!) 102 97 100 100  Resp: 19 13 13 13   Temp:      TempSrc:      SpO2: 100% 100% 100% 100%  Weight:      Height:        Constitutional: Generalized weakness Vitals:   05/25/18 1630 05/25/18 1645 05/25/18 1700 05/25/18 1715  BP: (!) 143/104 (!) 146/98 (!) 141/112 Marland Kitchen(!)  143/110  Pulse: (!) 102 97 100 100  Resp: 19 13 13 13   Temp:      TempSrc:      SpO2: 100% 100% 100% 100%  Weight:      Height:       Eyes: PERRL, lids and conjunctivae normal ENMT: Mucous membranes are dry. Posterior pharynx clear of any exudate or lesions.Normal dentition.  Neck: normal, supple, no masses, no thyromegaly Respiratory: clear to auscultation bilaterally, no wheezing, no crackles. Normal respiratory effort. No accessory muscle use.  Cardiovascular: Regular rate and rhythm, no murmurs / rubs / gallops. No extremity edema. 2+ pedal pulses. No carotid bruits.  Abdomen: no tenderness, no masses palpated. No hepatosplenomegaly. Bowel sounds positive.    Musculoskeletal: no clubbing / cyanosis. No joint deformity upper and lower extremities. Good ROM, no contractures. Normal muscle tone.  Skin: no rashes, lesions, ulcers. No induration Neurologic: CN 2-12 grossly intact. Sensation intact, DTR normal. Strength 5/5 in all 4.  Psychiatric: Normal judgment and insight. Alert and oriented x 3. Normal mood.   Foley Catheter:None  Labs on Admission: I have personally reviewed following labs and imaging studies  CBC: Recent Labs  Lab 05/25/18 1500 05/25/18 1506 05/25/18 1724  WBC 24.8*  --   --   HGB 13.2 15.3 11.2*  HCT 44.3 45.0 33.0*  MCV 105.5*  --   --   PLT 370  --   --    Basic Metabolic Panel: Recent Labs  Lab 05/25/18 1506 05/25/18 1604 05/25/18 1724  NA 134* 138 139  K 5.8* 4.7 4.3  CL 111 109 113*  CO2  --  7*  --   GLUCOSE 677* 445* 311*  BUN 34* 20 23*  CREATININE 0.80 1.63* 0.60*  CALCIUM  --  8.4*  --    GFR: Estimated Creatinine Clearance: 88.5 mL/min (A) (by C-G formula based on SCr of 0.6 mg/dL (L)). Liver Function Tests: Recent Labs  Lab 05/25/18 1604  AST 57*  ALT 81*  ALKPHOS 87  BILITOT 2.5*  PROT 6.5  ALBUMIN 3.7   No results for input(s): LIPASE, AMYLASE in the last 168 hours. No results for input(s): AMMONIA in the last 168 hours. Coagulation Profile: No results for input(s): INR, PROTIME in the last 168 hours. Cardiac Enzymes: No results for input(s): CKTOTAL, CKMB, CKMBINDEX, TROPONINI in the last 168 hours. BNP (last 3 results) No results for input(s): PROBNP in the last 8760 hours. HbA1C: No results for input(s): HGBA1C in the last 72 hours. CBG: Recent Labs  Lab 05/25/18 1442 05/25/18 1653  GLUCAP 586* 293*   Lipid Profile: No results for input(s): CHOL, HDL, LDLCALC, TRIG, CHOLHDL, LDLDIRECT in the last 72 hours. Thyroid Function Tests: No results for input(s): TSH, T4TOTAL, FREET4, T3FREE, THYROIDAB in the last 72 hours. Anemia Panel: No results for input(s): VITAMINB12,  FOLATE, FERRITIN, TIBC, IRON, RETICCTPCT in the last 72 hours. Urine analysis:    Component Value Date/Time   COLORURINE STRAW (A) 05/25/2018 1501   APPEARANCEUR CLEAR 05/25/2018 1501   LABSPEC 1.018 05/25/2018 1501   PHURINE 5.0 05/25/2018 1501   GLUCOSEU >=500 (A) 05/25/2018 1501   HGBUR SMALL (A) 05/25/2018 1501   BILIRUBINUR NEGATIVE 05/25/2018 1501   BILIRUBINUR Negative 02/07/2016 1528   KETONESUR 80 (A) 05/25/2018 1501   PROTEINUR NEGATIVE 05/25/2018 1501   UROBILINOGEN 0.2 02/07/2016 1528   UROBILINOGEN 0.2 03/22/2015 0913   NITRITE NEGATIVE 05/25/2018 1501   LEUKOCYTESUR NEGATIVE 05/25/2018 1501    Radiological Exams on  Admission: Dg Chest Portable 1 View  Result Date: 05/25/2018 CLINICAL DATA:  Tachycardia and hyperglycemia. EXAM: PORTABLE CHEST 1 VIEW COMPARISON:  03/09/2018 and 01/28/2017 FINDINGS: Lungs are adequately inflated and otherwise clear. Cardiomediastinal silhouette, bones and soft tissues are normal. IMPRESSION: No active disease. Electronically Signed   By: Elberta Fortis M.D.   On: 05/25/2018 16:19     Assessment/Plan Principal Problem:   Diabetic ketoacidosis without coma associated with type 1 diabetes mellitus (HCC) Active Problems:   DM (diabetes mellitus), type 1, uncontrolled (HCC)   Adjustment disorder with mixed anxiety and depressed mood   Acute renal failure (HCC)   Lactic acidosis   Leucocytosis   AKI (acute kidney injury) (HCC)  Diabetic ketoacidosis: Continue insulin drip.  Continue DKA protocol.  Patient says he has been taking insulin at home.  Continue IV fluids.  Zofran for nausea.  Type 1 diabetes mellitus: On insulin at home.  Will check hemoglobin A1C level.  Will request for diabetic coordinator consult.  Might need to adjust his insulin dose on discharge.  Lactic acidosis/leukocytosis: Most likely these are from hemoconcentration from dehydration.  Anticipate improvement with IV fluids.  No suspicion for sepsis or infectious  etiology.  Chest x-ray did not show pneumonia.  UA did not show any urine tract infection.  Will not continue antibiotics.  Acute kidney injury: Secondary to dehydration from increased urination secondary to glycosuria.  Continue IV fluids.  Kidney function is already improving.  Adjustment disorder with mixed anxiety and depressed mood: Currently not on medication at home.  He says he is moderate stable currently  Severity of Illness: The appropriate patient status for this patient is INPATIENT.  Needs  insulin drip for DKA.  High risk for decompensation  DVT prophylaxis: Lovenox Code Status: Full code Family Communication: None present at the bedside Consults called: None     Burnadette Pop MD [facility] Pager [phone]  If 7PM-7AM, please contact night-coverage [email or web address] Password [email or web address]  05/25/2018, 5:50 PM

## 2018-05-25 NOTE — ED Provider Notes (Signed)
[facility] EMERGENCY DEPARTMENT Provider Note   CSN: [id number] Arrival date & time: 05/25/18  1430     History   Chief Complaint No chief complaint on file.   HPI Izic Stfort is a 33 y.o. male.  HPI   Patient is a 33 year old male with a past medical history of type 1 diabetes, acute renal failure, and multiple prior hospitalizations for DKA who presents via EMS from home after his mother called EMS due to concerns of altered mental status.  When asked why the patient is in the emergency department he states he is dehydrated and has vomited proximally 4 times over the last 24 hours..  He also states he has some nausea and abdominal pain.  States he took 20 units of Lantus and some NovoLog earlier today but is not exactly sure when.  Endorses prior similar episodes.  Denies any recent headache, earache, sore throat, cough, congestion, chest pain, diarrhea, dysuria, blood in stool, blood in urine, rash, focal extremity pain, or other acute complaints.  Past Medical History:  Diagnosis Date  . ARF (acute renal failure) (HCC) 2011   pt was on dialysis "for 1 day approx 2011, pt is no longer on dialysis (03/09/2017)  . DKA (diabetic ketoacidoses) (HCC)    "several times" (03/09/2017)  . Type I diabetes mellitus [facility]) Dx 2012    Patient Active Problem List   Diagnosis Date Noted  . DKA (diabetic ketoacidoses) (HCC) 05/25/2018  . Nausea & vomiting 11/29/2017  . Diabetes mellitus type 1, uncontrolled, with complications (HCC)   . Noncompliance by refusing service   . DKA, type 1 (HCC) 03/09/2017  . Hypophosphatemia 08/27/2016  . AKI (acute kidney injury) (HCC) 07/11/2016  . Hyperkalemia 03/09/2016  . Leucocytosis 03/09/2016  . Diabetic ketoacidosis without coma associated with type 1 diabetes mellitus (HCC)   . Non-intractable vomiting with nausea   . Chest pain 12/18/2015  . Marijuana use 07/22/2015  . Proteinuria with type 1 diabetes mellitus (HCC)  07/22/2015  . Hypertension complicating diabetes (HCC) 07/22/2015  . Lactic acidosis   . Uncontrolled type 1 diabetes mellitus with ketoacidosis without coma (HCC)   . Hypocalcemia   . Acute renal failure (HCC)   . Hypokalemia   . Hypomagnesemia   . Abnormal EKG 06/15/2015  . Sinus tachycardia   . Tachypnea   . Tinea pedis 12/17/2014  . Adjustment disorder with mixed anxiety and depressed mood 09/03/2014  . Protein-calorie malnutrition, severe (HCC) 07/06/2014  . DM (diabetes mellitus), type 1, uncontrolled (HCC) 05/01/2014  . HLD (hyperlipidemia) 04/26/2014  . Substance abuse (HCC)   . Dehydration 07/07/2013  . Fatigue 03/10/2012    Past Surgical History:  Procedure Laterality Date  . NO PAST SURGERIES          Home Medications    Prior to Admission medications   Medication Sig Start Date End Date Taking? Authorizing Provider  insulin aspart (NOVOLOG) 100 UNIT/ML injection Inject 10 Units into the skin 3 (three) times daily before meals. Patient taking differently: Inject 8-10 Units into the skin 3 (three) times daily before meals. Sliding scale 04/23/18  Yes Osvaldo Shipper, MD  Insulin Glargine (LANTUS) 100 UNIT/ML Solostar Pen Inject 20 Units into the skin daily. 04/23/18  Yes Osvaldo Shipper, MD    Family History Family History  Problem Relation Age of Onset  . Diabetes Father   . Diabetes Maternal Grandmother   . Heart disease Neg Hx   . Hypertension Neg Hx  Social History Social History   Tobacco Use  . Smoking status: Former Smoker    Packs/day: 0.25    Years: 10.00    Pack years: 2.50    Types: Cigarettes    Last attempt to quit: 09/19/2013    Years since quitting: 4.6  . Smokeless tobacco: Never Used  Substance Use Topics  . Alcohol use: Yes    Alcohol/week: 0.0 standard drinks    Comment: 03/09/2017 "normally I don't drink; drank 3 shots liquor 2 days ago"  . Drug use: No     Allergies   Patient has no known allergies.   Review of  Systems Review of Systems  Constitutional: Negative for chills and fever.  HENT: Negative for ear pain and sore throat.   Eyes: Negative for pain and visual disturbance.  Respiratory: Negative for cough and shortness of breath.   Cardiovascular: Negative for chest pain and palpitations.  Gastrointestinal: Positive for abdominal pain, nausea and vomiting.  Genitourinary: Negative for dysuria and hematuria.  Musculoskeletal: Negative for arthralgias and back pain.  Skin: Negative for color change and rash.  Neurological: Positive for light-headedness. Negative for seizures and syncope.  All other systems reviewed and are negative.    Physical Exam Updated Vital Signs BP (!) 135/96   Pulse (!) 111   Temp (!) 97.4 F (36.3 C) (Oral) Comment: WARM BLANKETS GIVEN  Resp 13   Ht 5\' 6"  (1.676 m)   Wt 48.1 kg   SpO2 100%   BMI 17.11 kg/m   Physical Exam Vitals signs and nursing note reviewed.  Constitutional:      Appearance: He is well-developed and normal weight. He is ill-appearing.  HENT:     Head: Normocephalic and atraumatic.     Right Ear: External ear normal.     Left Ear: External ear normal.     Nose: Nose normal.     Mouth/Throat:     Mouth: Mucous membranes are dry.  Eyes:     Extraocular Movements: Extraocular movements intact.     Conjunctiva/sclera: Conjunctivae normal.     Pupils: Pupils are equal, round, and reactive to light.  Neck:     Musculoskeletal: Neck supple.  Cardiovascular:     Rate and Rhythm: Regular rhythm. Tachycardia present.     Pulses: Normal pulses.     Heart sounds: No murmur.  Pulmonary:     Effort: Pulmonary effort is normal. No respiratory distress.     Breath sounds: Normal breath sounds.  Abdominal:     Palpations: Abdomen is soft.     Tenderness: There is no abdominal tenderness.  Skin:    General: Skin is warm and dry.     Capillary Refill: Capillary refill takes less than 2 seconds.  Neurological:     General: No focal  deficit present.     Mental Status: He is alert.    ED Treatments / Results  Labs (all labs ordered are listed, but only abnormal results are displayed) Labs Reviewed  CBC - Abnormal; Notable for the following components:      Result Value   WBC 24.8 (*)    RBC 4.20 (*)    MCV 105.5 (*)    MCHC 29.8 (*)    All other components within normal limits  URINALYSIS, ROUTINE W REFLEX MICROSCOPIC - Abnormal; Notable for the following components:   Color, Urine STRAW (*)    Glucose, UA >=500 (*)    Hgb urine dipstick SMALL (*)    Ketones,  ur 80 (*)    All other components within normal limits  BETA-HYDROXYBUTYRIC ACID - Abnormal; Notable for the following components:   Beta-Hydroxybutyric Acid 7.18 (*)    All other components within normal limits  COMPREHENSIVE METABOLIC PANEL - Abnormal; Notable for the following components:   CO2 7 (*)    Glucose, Bld 445 (*)    Creatinine, Ser 1.63 (*)    Calcium 8.4 (*)    AST 57 (*)    ALT 81 (*)    Total Bilirubin 2.5 (*)    GFR calc non Af Amer 54 (*)    Anion gap 22 (*)    All other components within normal limits  CBG MONITORING, ED - Abnormal; Notable for the following components:   Glucose-Capillary 586 (*)    All other components within normal limits  I-STAT CHEM 8, ED - Abnormal; Notable for the following components:   Sodium 134 (*)    Potassium 5.8 (*)    BUN 34 (*)    Glucose, Bld 677 (*)    TCO2 7 (*)    All other components within normal limits  I-STAT CG4 LACTIC ACID, ED - Abnormal; Notable for the following components:   Lactic Acid, Venous 7.13 (*)    All other components within normal limits  I-STAT VENOUS BLOOD GAS, ED - Abnormal; Notable for the following components:   pH, Ven 7.025 (*)    pCO2, Ven 16.2 (*)    pO2, Ven 58.0 (*)    Bicarbonate 4.2 (*)    TCO2 <5 (*)    Acid-base deficit 25.0 (*)    All other components within normal limits  I-STAT CHEM 8, ED - Abnormal; Notable for the following components:    Chloride 113 (*)    BUN 23 (*)    Creatinine, Ser 0.60 (*)    Glucose, Bld 311 (*)    TCO2 13 (*)    Hemoglobin 11.2 (*)    HCT 33.0 (*)    All other components within normal limits  CBG MONITORING, ED - Abnormal; Notable for the following components:   Glucose-Capillary 293 (*)    All other components within normal limits  CBG MONITORING, ED - Abnormal; Notable for the following components:   Glucose-Capillary 220 (*)    All other components within normal limits  I-STAT VENOUS BLOOD GAS, ED - Abnormal; Notable for the following components:   pH, Ven 7.243 (*)    pCO2, Ven 23.5 (*)    pO2, Ven 74.0 (*)    Bicarbonate 10.1 (*)    TCO2 11 (*)    Acid-base deficit 16.0 (*)    All other components within normal limits  CBG MONITORING, ED - Abnormal; Notable for the following components:   Glucose-Capillary 169 (*)    All other components within normal limits  I-STAT CG4 LACTIC ACID, ED - Abnormal; Notable for the following components:   Lactic Acid, Venous 5.35 (*)    All other components within normal limits  CULTURE, BLOOD (ROUTINE X 2)  CULTURE, BLOOD (ROUTINE X 2)  URINE CULTURE  BLOOD GAS, VENOUS  BASIC METABOLIC PANEL  BASIC METABOLIC PANEL  HEMOGLOBIN A1C  BASIC METABOLIC PANEL  BASIC METABOLIC PANEL  BASIC METABOLIC PANEL  LACTIC ACID, PLASMA  CBC  I-STAT CHEM 8, ED  CBG MONITORING, ED    EKG None  Radiology Dg Chest Portable 1 View  Result Date: 05/25/2018 CLINICAL DATA:  Tachycardia and hyperglycemia. EXAM: PORTABLE CHEST 1 VIEW COMPARISON:  03/09/2018 and 01/28/2017 FINDINGS: Lungs are adequately inflated and otherwise clear. Cardiomediastinal silhouette, bones and soft tissues are normal. IMPRESSION: No active disease. Electronically Signed   By: Elberta Fortisaniel  Boyle M.D.   On: 05/25/2018 16:19    Procedures Procedures (including critical care time)  Medications Ordered in ED Medications  insulin regular, human (MYXREDLIN) 100 units/ 100 mL infusion (2.3  Units/hr Intravenous New Bag/Given 05/25/18 1709)  dextrose 5 %-0.45 % sodium chloride infusion (has no administration in time range)  0.9 %  sodium chloride infusion (has no administration in time range)  enoxaparin (LOVENOX) injection 40 mg (has no administration in time range)  ondansetron (ZOFRAN) injection 4 mg (has no administration in time range)  lactated ringers bolus 1,000 mL (0 mLs Intravenous Stopped 05/25/18 1539)  lactated ringers bolus 1,000 mL (0 mLs Intravenous Stopped 05/25/18 1658)  cefTRIAXone (ROCEPHIN) 1 g in sodium chloride 0.9 % 100 mL IVPB (0 g Intravenous Stopped 05/25/18 1658)  lactated ringers bolus 1,000 mL (0 mLs Intravenous Stopped 05/25/18 1920)  potassium chloride 10 mEq in 100 mL IVPB (0 mEq Intravenous Stopped 05/25/18 1920)     Initial Impression / Assessment and Plan / ED Course  I have reviewed the triage vital signs and the nursing notes.  Pertinent labs & imaging results that were available during my care of the patient were reviewed by me and considered in my medical decision making (see chart for details).    Patient is a [age] year old male who presents above-stated history exam.  On presentation patient is afebrile with heart rate in the 130s and otherwise stable vital signs.  Exam as above remarkable tachycardia and dry mucous membranes with a chronically ill-appearing patient.   Initial capillary blood glucose is 586.  Chem-8 remarkable for NA 134, K5.8, CL 111, BUN 34, creatinine 0.8, glucose 677, CO2 of 7, hemoglobin of 15.3.  VBG shows a pH of 7.025, PCO2 of 16.2, bicarb of 4.2.  UA shows greater than 500 glucose, small hemoglobin dipstick, 80 ketones.  CBC shows WBC count of 24.8, hemoglobin 13.2.  Lactic acid is 7.13.  This constellation of lab abnormalities is most consistent with severe DKA.  Patient was given 2 L of IV fluids and started on insulin drip.  Given potassium is greater than 5 we will hold potassium repletion at this time.  Every 2 hour Chem-8  and every hour point-of-care glucoses were ordered.  Given elevated lactic acid, tachycardia, WBC count of 24, and absence of clear source precipitating DKA blood and urine cultures were obtained and the patient was given 1 dose of Rocephin.  After approximately 1.5 hours on the insulin drip after receiving 3 L of normal saline Chem-8, VBG, lactic acid were redrawn.  Repeat Chem-8 showed a glucose of 311, CO2 of 13, potassium of 4.3.  Potassium repletion was added to the patient's IV fluids.  Repeat VBG showed a pH of 7.24, PCO2 of 23.5, bicarb of 10.1.  Repeat lactic acid 5.35.  Patient admitted to hospital service in stable condition.  Final Clinical Impressions(s) / ED Diagnoses   Final diagnoses:  Diabetic ketoacidosis without coma associated with diabetes mellitus due to underlying condition (HCC)  Hyperkalemia  Leukocytosis, unspecified type  Lactic acidosis    ED Discharge Orders    None       Antoine PrimasSmith, Itzae Mccurdy, MD 05/25/18 Kristine [id number]    Zavitz, Joshua, MD 05/26/18 [phone]

## 2018-05-25 NOTE — ED Triage Notes (Signed)
Patient arrived from home via [facility]. PT's mom called reporting that patient is not himself, he does not "take care of his diabetes." EMS reports CBG=[id number] EMS gave NS and 4mg  Zofran.  On arrival patient is A&O X4, patient reports that he took  Insulin twice this morning (10units Lantus X 2, and 7 units Novolog)

## 2018-05-25 NOTE — ED Notes (Signed)
I Stat Lac Acid result of 5.35 reported to Dr. Michiel Sites

## 2018-05-26 ENCOUNTER — Encounter (HOSPITAL_COMMUNITY): Payer: Self-pay | Admitting: *Deleted

## 2018-05-26 LAB — CBC
HCT: 30.5 % — ABNORMAL LOW (ref 39.0–52.0)
Hemoglobin: 10.2 g/dL — ABNORMAL LOW (ref 13.0–17.0)
MCH: 31.5 pg (ref 26.0–34.0)
MCHC: 33.4 g/dL (ref 30.0–36.0)
MCV: 94.1 fL (ref 80.0–100.0)
Platelets: 249 10*3/uL (ref 150–400)
RBC: 3.24 MIL/uL — ABNORMAL LOW (ref 4.22–5.81)
RDW: 14.2 % (ref 11.5–15.5)
WBC: 10.8 10*3/uL — ABNORMAL HIGH (ref 4.0–10.5)
nRBC: 0 % (ref 0.0–0.2)

## 2018-05-26 LAB — GLUCOSE, CAPILLARY
Glucose-Capillary: 102 mg/dL — ABNORMAL HIGH (ref 70–99)
Glucose-Capillary: 110 mg/dL — ABNORMAL HIGH (ref 70–99)
Glucose-Capillary: 150 mg/dL — ABNORMAL HIGH (ref 70–99)
Glucose-Capillary: 229 mg/dL — ABNORMAL HIGH (ref 70–99)
Glucose-Capillary: 74 mg/dL (ref 70–99)
Glucose-Capillary: 80 mg/dL (ref 70–99)
Glucose-Capillary: 96 mg/dL (ref 70–99)

## 2018-05-26 LAB — BASIC METABOLIC PANEL
Anion gap: 5 (ref 5–15)
Anion gap: 5 (ref 5–15)
Anion gap: 7 (ref 5–15)
BUN: 12 mg/dL (ref 6–20)
BUN: 12 mg/dL (ref 6–20)
BUN: 12 mg/dL (ref 6–20)
CHLORIDE: 111 mmol/L (ref 98–111)
CO2: 21 mmol/L — AB (ref 22–32)
CO2: 21 mmol/L — ABNORMAL LOW (ref 22–32)
CO2: 21 mmol/L — AB (ref 22–32)
Calcium: 7.7 mg/dL — ABNORMAL LOW (ref 8.9–10.3)
Calcium: 7.7 mg/dL — ABNORMAL LOW (ref 8.9–10.3)
Calcium: 7.8 mg/dL — ABNORMAL LOW (ref 8.9–10.3)
Chloride: 111 mmol/L (ref 98–111)
Chloride: 109 mmol/L (ref 98–111)
Creatinine, Ser: 0.74 mg/dL (ref 0.61–1.24)
Creatinine, Ser: 0.73 mg/dL (ref 0.61–1.24)
Creatinine, Ser: 0.69 mg/dL (ref 0.61–1.24)
GFR calc Af Amer: >60 mL/min (ref >60)
GFR calc Af Amer: >60 mL/min (ref >60)
GFR calc non Af Amer: >60 mL/min (ref >60)
GFR calc non Af Amer: >60 mL/min (ref >60)
GFR calc non Af Amer: >60 mL/min (ref >60)
Glucose, Bld: 120 mg/dL — ABNORMAL HIGH (ref 70–99)
Glucose, Bld: 74 mg/dL (ref 70–99)
Glucose, Bld: 53 mg/dL — ABNORMAL LOW (ref 70–99)
Potassium: 3.5 mmol/L (ref 3.5–5.1)
Potassium: 3.2 mmol/L — ABNORMAL LOW (ref 3.5–5.1)
Potassium: 3.1 mmol/L — ABNORMAL LOW (ref 3.5–5.1)
Sodium: 137 mmol/L (ref 135–145)
Sodium: 137 mmol/L (ref 135–145)
Sodium: 137 mmol/L (ref 135–145)

## 2018-05-26 LAB — URINE CULTURE: Special Requests: NORMAL

## 2018-05-26 LAB — LACTIC ACID, PLASMA: Lactic Acid, Venous: 0.8 mmol/L (ref 0.5–1.9)

## 2018-05-26 LAB — MAGNESIUM: Magnesium: 1.8 mg/dL (ref 1.7–2.4)

## 2018-05-26 LAB — MRSA PCR SCREENING: MRSA by PCR: NEGATIVE

## 2018-05-26 MED ORDER — POTASSIUM CHLORIDE CRYS ER 20 MEQ PO TBCR
40.0000 meq | EXTENDED_RELEASE_TABLET | ORAL | Status: AC
  Administered 2018-05-26 (×2): 40 meq via ORAL
  Filled 2018-05-26 (×2): qty 2

## 2018-05-26 MED ORDER — ENSURE ENLIVE PO LIQD
237.0000 mL | Freq: Two times a day (BID) | ORAL | Status: DC
  Administered 2018-05-26 – 2018-05-27 (×2): 237 mL via ORAL

## 2018-05-26 NOTE — Progress Notes (Addendum)
PROGRESS NOTE    [name] [name]  ZOX:[id number] DOB: 1985/10/10 DOA: 05/25/2018 PCP: Hoy Register, MD   Brief Narrative: [name] [name] is a 33 y.o. male with medical history significant of diabetes type 1, depression/adjustment disorder who presents from home with complaints of generalized weakness.    He has been admitted  in the past for DKA.  Her mother was also concerned about his mental status.  Patient states he was feeling very weak since last one and half days.He had his frequency of  Urination.  Found to have DKA on presentation.  Admitted for further management.  Assessment & Plan:   Principal Problem:   Diabetic ketoacidosis without coma associated with type 1 diabetes mellitus (HCC) Active Problems:   DM (diabetes mellitus), type 1, uncontrolled (HCC)   Adjustment disorder with mixed anxiety and depressed mood   Acute renal failure (HCC)   Lactic acidosis   Leucocytosis   AKI (acute kidney injury) (HCC)   DKA (diabetic ketoacidoses) (HCC)  Diabetic ketoacidosis: insulin drip stopped.  Started on long-acting insulin and sliding scale.  Started on diet.   Type 1 diabetes mellitus: On insulin lantus and novolog at home. Hemoglobin A1C of 12.1.  Will request for diabetic coordinator consult.  Might need to adjust his insulin dose on discharge.  Lactic acidosis/leukocytosis: Most likely these are from hemoconcentration from dehydration.  Resolved.  Acute kidney injury: Secondary to dehydration from increased urination secondary to glycosuria.  Stopped IV fluids.  Kidney function is on baseline.  Hypokalemia: Being supplemented.  Will check magnesium  Adjustment disorder with mixed anxiety and depressed mood: Currently not on medication at home.  He says he is moderate stable currently         DVT prophylaxis: Lovenox Code Status: Full Family Communication: None present at bedside Disposition Plan: Home tomorrow   Consultants: None  Procedures:  None  Antimicrobials: None  Subjective: Patient seen and examined at bedside this morning.  Remains comfortable.  Hemodynamically stable.  Objective: Vitals:   05/25/18 2142 05/25/18 2211 05/26/18 0031 05/26/18 0418  BP:  121/79 128/85 125/81  Pulse:  92 96 88  Resp: 12 12 13 12   Temp:  99.3 F (37.4 C) 98.6 F (37 C) 98.7 F (37.1 C)  TempSrc:  Oral Oral Oral  SpO2:  99% 100% 100%  Weight:  48.9 kg    Height:  5\' 6"  (1.676 m)      Intake/Output Summary (Last 24 hours) at 05/26/2018 1054 Last data filed at 05/26/2018 0900 Gross per 24 hour  Intake 713.02 ml  Output 400 ml  Net 313.02 ml   Filed Weights   05/25/18 1442 05/25/18 2211  Weight: 48.1 kg 48.9 kg    Examination:  General exam: Appears calm and comfortable ,Not in distress,thin built,poor hygiene HEENT:PERRL,Oral mucosa moist, Ear/Nose normal on gross exam Respiratory system: Bilateral equal air entry, normal vesicular breath sounds, no wheezes or crackles  Cardiovascular system: S1 & S2 heard, RRR. No JVD, murmurs, rubs, gallops or clicks. No pedal edema. Gastrointestinal system: Abdomen is nondistended, soft and nontender. No organomegaly or masses felt. Normal bowel sounds heard. Central nervous system: Alert and oriented. No focal neurological deficits. Extremities: No edema, no clubbing ,no cyanosis, distal peripheral pulses palpable. Skin: No rashes, lesions or ulcers,no icterus ,no pallor MSK: Normal muscle bulk,tone ,power Psychiatry: Judgement and insight appear normal. Mood & affect appropriate.     Data Reviewed: I have personally reviewed following labs and imaging studies  CBC: Recent Labs  Lab 05/25/18 1500 05/25/18 1506 05/25/18 1724 05/26/18 0610  WBC 24.8*  --   --  10.8*  HGB 13.2 15.3 11.2* 10.2*  HCT 44.3 45.0 33.0* 30.5*  MCV 105.5*  --   --  94.1  PLT 370  --   --  249   Basic Metabolic Panel: Recent Labs  Lab 05/25/18 1604 05/25/18 1724 05/25/18 2045 05/26/18 0025  05/26/18 0341 05/26/18 0610  NA 138 139 133* 137 137 137  K 4.7 4.3 3.5 3.5 3.2* 3.1*  CL 109 113* 108 111 111 109  CO2 7*  --  18* 21* 21* 21*  GLUCOSE 445* 311* 149* 120* 74 53*  BUN 20 23* 13 12 12 12   CREATININE 1.63* 0.60* 0.89 0.74 0.73 0.69  CALCIUM 8.4*  --  7.6* 7.7* 7.7* 7.8*   GFR: Estimated Creatinine Clearance: 90 mL/min (by C-G formula based on SCr of 0.69 mg/dL). Liver Function Tests: Recent Labs  Lab 05/25/18 1604  AST 57*  ALT 81*  ALKPHOS 87  BILITOT 2.5*  PROT 6.5  ALBUMIN 3.7   No results for input(s): LIPASE, AMYLASE in the last 168 hours. No results for input(s): AMMONIA in the last 168 hours. Coagulation Profile: No results for input(s): INR, PROTIME in the last 168 hours. Cardiac Enzymes: No results for input(s): CKTOTAL, CKMB, CKMBINDEX, TROPONINI in the last 168 hours. BNP (last 3 results) No results for input(s): PROBNP in the last 8760 hours. HbA1C: Recent Labs    05/25/18 2045  HGBA1C 12.1*   CBG: Recent Labs  Lab 05/25/18 2334 05/26/18 0037 05/26/18 0210 05/26/18 0423 05/26/18 0758  GLUCAP 125* 110* 102* 96 74   Lipid Profile: No results for input(s): CHOL, HDL, LDLCALC, TRIG, CHOLHDL, LDLDIRECT in the last 72 hours. Thyroid Function Tests: No results for input(s): TSH, T4TOTAL, FREET4, T3FREE, THYROIDAB in the last 72 hours. Anemia Panel: No results for input(s): VITAMINB12, FOLATE, FERRITIN, TIBC, IRON, RETICCTPCT in the last 72 hours. Sepsis Labs: Recent Labs  Lab 05/25/18 1521 05/25/18 1748 05/26/18 0341  LATICACIDVEN 7.13* 5.35* 0.8    Recent Results (from the past 240 hour(s))  Blood culture (routine x 2)     Status: None (Preliminary result)   Collection Time: 05/25/18  4:04 PM  Result Value Ref Range Status   Specimen Description BLOOD RIGHT HAND  Final   Special Requests   Final    BOTTLES DRAWN AEROBIC AND ANAEROBIC Blood Culture results may not be optimal due to an inadequate volume of blood received in  culture bottles   Culture NO GROWTH < 24 HOURS  Final   Report Status PENDING  Incomplete  Blood culture (routine x 2)     Status: None (Preliminary result)   Collection Time: 05/25/18  4:22 PM  Result Value Ref Range Status   Specimen Description BLOOD BLOOD LEFT ARM UPPER  Final   Special Requests   Final    BOTTLES DRAWN AEROBIC AND ANAEROBIC Blood Culture results may not be optimal due to an inadequate volume of blood received in culture bottles   Culture NO GROWTH < 24 HOURS  Final   Report Status PENDING  Incomplete  MRSA PCR Screening     Status: None   Collection Time: 05/25/18 11:18 PM  Result Value Ref Range Status   MRSA by PCR NEGATIVE NEGATIVE Final    Comment:        The GeneXpert MRSA Assay (FDA approved for NASAL specimens only), is one component of a comprehensive  MRSA colonization surveillance program. It is not intended to diagnose MRSA infection nor to guide or monitor treatment for MRSA infections. Performed at [facility], [address] [place], [place] [address]          Radiology Studies: Dg Chest Portable 1 View  Result Date: 05/25/2018 CLINICAL DATA:  Tachycardia and hyperglycemia. EXAM: PORTABLE CHEST 1 VIEW COMPARISON:  03/09/2018 and 01/28/2017 FINDINGS: Lungs are adequately inflated and otherwise clear. Cardiomediastinal silhouette, bones and soft tissues are normal. IMPRESSION: No active disease. Electronically Signed   By: Elberta Fortisaniel  Boyle M.D.   On: 05/25/2018 16:19        Scheduled Meds: . enoxaparin (LOVENOX) injection  40 mg Subcutaneous Q24H  . feeding supplement (ENSURE ENLIVE)  237 mL Oral BID BM  . insulin aspart  0-9 Units Subcutaneous Q4H  . insulin glargine  15 Units Subcutaneous QHS  . pneumococcal 23 valent vaccine  0.5 mL Intramuscular Tomorrow-1000  . potassium chloride  40 mEq Oral Q4H   Continuous Infusions:   LOS: 1 day    Time spent: 35 mins.More than 50% of that time was spent in counseling and/or  coordination of care.      Burnadette PopAmrit Nhan Qualley, MD [facility] Pager [phone]  If 7PM-7AM, please contact night-coverage [email or web address] Password [email or web address] 05/26/2018, 10:54 AM

## 2018-05-26 NOTE — Progress Notes (Signed)
Inpatient Diabetes Program Recommendations  AACE/ADA: New Consensus Statement on Inpatient Glycemic Control (2015)  Target Ranges:  Prepandial:   less than 140 mg/dL      Peak postprandial:   less than 180 mg/dL (1-2 hours)      Critically ill patients:  140 - 180 mg/dL   Lab Results  Component Value Date   GLUCAP 80 05/26/2018   HGBA1C 12.1 (H) 05/25/2018    Review of Glycemic Control  Diabetes history: DM1 Outpatient Diabetes medications: Lantus 20 units + Novolog 10 units tid meal coverage Current orders for Inpatient glycemic control: Lantus 15 units + Novolog sensitive correction q 4 hrs.  Inpatient Diabetes Program Recommendations:   -Change Novolog correction scale to tid if patient eating diet -Decrease Lantus to 10 units qd and readjust. Patient is not requiring much insulin since admitted with DKA. Patient was last seen by DM Coordinator 04/22/18 and A1c has decreased from 14 to 12.1. Will attempt to speak to patient 05/27/18.  Thank you, Billy Fischer. Pierina Schuknecht, RN, MSN, CDE  Diabetes Coordinator Inpatient Glycemic Control Team Team Pager [phone] (8am-5pm) 05/26/2018 2:31 PM

## 2018-05-26 NOTE — Progress Notes (Signed)
The insulin drip was stopped at 21:43 for a CBG of 137.  The next hourly CBG was 137 & the one taken at 23:30 was 125 while he was only on the D5 1/2 Normal saline.  The on call hospitalsist was informed.  Blount, NP, ordered to discontinue the insulin drip, IV fluids, hourly CBG checks, and NPO diet. Also ordered for blood glucose checks every four hour, Novolog insulin 0-9 Units subcutaneous every 4 hours starting at 0000, Lantus insuln 15 Units subcutaneous daily at bedtime starting at 00:30, and placed the pt on a carb modified diet.  Will continue to monitor.  Harriet Massonavidson, Libi Corso E, RN

## 2018-05-26 NOTE — Progress Notes (Signed)
Pt did not receive Lantus insulin because his glucose levels were going down during the night and he did not want to eat anything.  The CBG at 04:30 was 96.  Paged on call hospitalist if Lantus could be moved to AM breakfast.  No reply given.  Will inform oncoming nurse.  Harriet Masson, RN

## 2018-05-27 LAB — BASIC METABOLIC PANEL
Anion gap: 5 (ref 5–15)
BUN: 8 mg/dL (ref 6–20)
CO2: 25 mmol/L (ref 22–32)
Calcium: 8.3 mg/dL — ABNORMAL LOW (ref 8.9–10.3)
Chloride: 109 mmol/L (ref 98–111)
Creatinine, Ser: 0.55 mg/dL — ABNORMAL LOW (ref 0.61–1.24)
GFR calc Af Amer: >60 mL/min (ref >60)
GFR calc non Af Amer: >60 mL/min (ref >60)
Glucose, Bld: 55 mg/dL — ABNORMAL LOW (ref 70–99)
Potassium: 3.4 mmol/L — ABNORMAL LOW (ref 3.5–5.1)
Sodium: 139 mmol/L (ref 135–145)

## 2018-05-27 LAB — GLUCOSE, CAPILLARY
Glucose-Capillary: 114 mg/dL — ABNORMAL HIGH (ref 70–99)
Glucose-Capillary: 121 mg/dL — ABNORMAL HIGH (ref 70–99)
Glucose-Capillary: 129 mg/dL — ABNORMAL HIGH (ref 70–99)
Glucose-Capillary: 47 mg/dL — ABNORMAL LOW (ref 70–99)

## 2018-05-27 MED ORDER — INSULIN GLARGINE 100 UNIT/ML ~~LOC~~ SOLN: 10.0000 [IU] | Freq: Every day | SUBCUTANEOUS | Status: DC

## 2018-05-27 MED ORDER — INSULIN GLARGINE 100 UNIT/ML SOLOSTAR PEN: 12.0000 [IU] | PEN_INJECTOR | Freq: Every day | SUBCUTANEOUS | 0 refills | Status: DC

## 2018-05-27 MED ORDER — INSULIN ASPART 100 UNIT/ML ~~LOC~~ SOLN: 3.0000 [IU] | Freq: Three times a day (TID) | SUBCUTANEOUS | 0 refills | Status: DC

## 2018-05-27 NOTE — Progress Notes (Signed)
Initial Nutrition Assessment  DOCUMENTATION CODES:   Severe malnutrition in context of chronic illness  INTERVENTION:   Recommend Glucerna Shake po TID, each supplement provides 220 kcal and 10 grams of protein  Recommend daily MVI  NUTRITION DIAGNOSIS:   Severe Malnutrition related to chronic illness(uncontrolled type 1 DM ) as evidenced by severe fat depletion, severe muscle depletion.  GOAL:   Patient will meet greater than or equal to 90% of their needs  MONITOR:   PO intake, Supplement acceptance, Labs, Weight trends, Skin, I & O's  REASON FOR ASSESSMENT:   Malnutrition Screening Tool    ASSESSMENT:   32-year-old male with a past medical history of substance abuse (marijuana), type 1 diabetes, acute renal failure, and multiple prior hospitalizations for DKA admitted with AMS and DKA   Met with pt in room today. Pt reports good appetite and oral intake at baseline but reports "every time I get sick my appetite goes away". Pt reports that his appetite has been poor for several days pta; pt reports poor appetite today. Pt reports eating a little of his breakfast today but pt is documented to have eaten 100%. Pt does not like Ensure and prefers Glucerna in any flavor while he is hospitalized. Recommend daily MVI as pt does not likely eat a balanced diet at home. Per chart, pt appears fairly weight stable pta. Pt to discharge today; RD will order supplements if pt does not discharge.   Medications reviewed and include: lovenox, insulin  Labs reviewed: creat 0.55(L) Wbc- 10.8(H), Hgb 10.2(L), Hct 30.5(L) cbgs- 120, 74, 53, 55 x 24 hrs AIC 12.1(H)- 1/4  NUTRITION - FOCUSED PHYSICAL EXAM:    Most Recent Value  Orbital Region  Mild depletion  Upper Arm Region  Severe depletion  Thoracic and Lumbar Region  Severe depletion  Buccal Region  Moderate depletion  Temple Region  Mild depletion  Clavicle Bone Region  Severe depletion  Clavicle and Acromion Bone Region  Severe  depletion  Scapular Bone Region  Moderate depletion  Dorsal Hand  Moderate depletion  Patellar Region  Severe depletion  Anterior Thigh Region  Severe depletion  Posterior Calf Region  Severe depletion  Edema (RD Assessment)  None  Hair  Reviewed  Eyes  Reviewed  Mouth  Reviewed  Skin  Reviewed  Nails  Reviewed     Diet Order:   Diet Order            Diet Carb Modified        Diet heart healthy/carb modified Room service appropriate? Yes; Fluid consistency: Thin  Diet effective now             EDUCATION NEEDS:   Education needs have been addressed  Skin:  Skin Assessment: Reviewed RN Assessment  Last BM:  1/3- per pt  Height:   Ht Readings from Last 1 Encounters:  05/25/18 5' 6" (1.676 m)    Weight:   Wt Readings from Last 1 Encounters:  05/25/18 48.9 kg    Ideal Body Weight:  67.2 kg  BMI:  Body mass index is 17.4 kg/m.  Estimated Nutritional Needs:   Kcal:  1800-2100kcal/day   Protein:  74-83g/day   Fluid:  >1.5L/day     MS, RD, LDN Pager #- [phone] Office#- [phone] After Hours Pager: [phone]  

## 2018-05-27 NOTE — Progress Notes (Signed)
Inpatient Diabetes Program Recommendations  AACE/ADA: New Consensus Statement on Inpatient Glycemic Control (2015)  Target Ranges:  Prepandial:   less than 140 mg/dL      Peak postprandial:   less than 180 mg/dL (1-2 hours)      Critically ill patients:  140 - 180 mg/dL   Lab Results  Component Value Date   GLUCAP 114 (H) 05/27/2018   HGBA1C 12.1 (H) 05/25/2018    Review of Glycemic Control Results for [name] [name], [name] [name] (MRN [id number]) as of 05/27/2018 09:31  Ref. Range 05/26/2018 20:10 05/27/2018 00:18 05/27/2018 04:18 05/27/2018 04:51 05/27/2018 08:16  Glucose-Capillary Latest Ref Range: 70 - 99 mg/dL 616 (H) 837 (H) 47 (L) 129 (H) 114 (H)   Diabetes history: DM1 Outpatient Diabetes medications: Lantus 20 units + Novolog 10 units tid meal coverage Current orders for Inpatient glycemic control: Lantus 15 units + Novolog sensitive correction q 4 hrs.  Inpatient Diabetes Program Recommendations:   Discussed discharge options with Dr. Renford Dills. -Lantus 12 units qd -Novolog 3 units tid meal coverage if eats 50% Refer back to [facility] [place] as soon as possible for followup.  Thank you, Billy Fischer. Anaeli Cornwall, RN, MSN, CDE  Diabetes Coordinator Inpatient Glycemic Control Team Team Pager [phone] (8am-5pm) 05/27/2018 9:32 AM

## 2018-05-27 NOTE — Progress Notes (Signed)
Hypoglycemic Event  CBG: 47  Treatment: 2 cups orange juice  Symptoms: none  Follow-up CBG: [id number] CBG Result:129  Possible Reasons for Event: inadequate meal intake  Comments/MD notified:no    Jamey Reas, Denijah Karrer [employer]

## 2018-05-27 NOTE — Progress Notes (Signed)
Discharge instructions (including medications) discussed with and copy provided to patient/caregiver 

## 2018-05-27 NOTE — Discharge Summary (Signed)
Physician Discharge Summary  [name] [name] ZJI:[id number] DOB: 12/29/1985 DOA: 05/25/2018  PCP: Hoy RegisterNewlin, Enobong, MD  Admit date: 05/25/2018 Discharge date: 05/27/2018  Admitted From: Home Disposition:  Home  Discharge Condition:Stable CODE STATUS:FULL Diet recommendation: Heart Healthy  Brief/Interim Summary: [name] Bossiernthony McGeeis a 32 y.o.malewith medical history significant ofdiabetes type 1, depression/adjustment disorder who presents from home with complaints of generalized weakness. He has been admitted in the past for DKA. Her mother was also concerned about his mental status. Patient stateshe was feeling very weaksince last one and half days.Hehad his frequency ofUrination.  Found to have DKA on presentation.  Admitted for further management.  He was then started on insulin drip.  His anion gap closed.  Insulin drip discontinued.  Diabetic coordinator consulted. He is stable for discharge home today.  Following problems were addressed during his hospitalization:   Diabetic ketoacidosis: insulin drip stopped.  Started on long-acting insulin and sliding scale.  Started on diet.  Type 1 diabetes mellitus:On insulin lantus and novolog at home. Hemoglobin A1C of 12.1. Diabetic coordinator consulted. He will be discharged on 12 units of Lantus at bedtime and NovoLog 3 units 3 times daily with meals.  Lactic acidosis/leukocytosis: Most likely these are from hemoconcentration from dehydration. Resolved.  Acute kidney injury:Secondary to dehydration from increased urination secondary to glycosuria.Stopped IV fluids. Kidney function is on baseline.  Hypokalemia: Supplemented.   Adjustment disorder with mixed anxiety and depressed mood:Currently not on medication at home. He sayshe mood is  stable currently.     Discharge Diagnoses:  Principal Problem:   Diabetic ketoacidosis without coma associated with type 1 diabetes mellitus (HCC) Active Problems:   DM  (diabetes mellitus), type 1, uncontrolled (HCC)   Adjustment disorder with mixed anxiety and depressed mood   Acute renal failure (HCC)   Lactic acidosis   Leucocytosis   AKI (acute kidney injury) (HCC)   DKA (diabetic ketoacidoses) (HCC)    Discharge Instructions  Discharge Instructions    Diet Carb Modified   Complete by:  As directed    Discharge instructions   Complete by:  As directed    1)Please follow up with your PCP in a week. 2)Continue insulin at home. Continue monitoring of your blood sugars. 3)Check HbA1C in 3 months.   Increase activity slowly   Complete by:  As directed      Allergies as of 05/27/2018   No Known Allergies     Medication List    TAKE these medications   insulin aspart 100 UNIT/ML injection Commonly known as:  novoLOG Inject 3 Units into the skin 3 (three) times daily before meals. What changed:  how much to take   Insulin Glargine 100 UNIT/ML Solostar Pen Commonly known as:  LANTUS Inject 12 Units into the skin daily. What changed:  how much to take      Follow-up Information    Hoy RegisterNewlin, Enobong, MD. Schedule an appointment as soon as possible for a visit in 1 week(s).   Specialty:  Family Medicine Contact information: [address] [place] [address] [phone]          No Known Allergies  Consultations:  None   Procedures/Studies: Dg Chest Portable 1 View  Result Date: 05/25/2018 CLINICAL DATA:  Tachycardia and hyperglycemia. EXAM: PORTABLE CHEST 1 VIEW COMPARISON:  03/09/2018 and 01/28/2017 FINDINGS: Lungs are adequately inflated and otherwise clear. Cardiomediastinal silhouette, bones and soft tissues are normal. IMPRESSION: No active disease. Electronically Signed   By: Elberta Fortisaniel  Boyle M.D.  On: 05/25/2018 16:19       Subjective: Patient seen and examined at bedside this morning.  Remains comfortable.  Hemodynamically stable.  Noted to be mildly hypoglycemic this morning.  Insulin dose adjusted.  Stable  for discharge today.  Discharge Exam: Vitals:   05/27/18 0421 05/27/18 0817  BP: 121/90 (!) 159/115  Pulse: 87 71  Resp: 14 13  Temp: 97.8 F (36.6 C) 98.4 F (36.9 C)  SpO2: 100% 100%   Vitals:   05/26/18 2013 05/27/18 0023 05/27/18 0421 05/27/18 0817  BP: (!) 124/95 (!) 127/96 121/90 (!) 159/115  Pulse: 80 87 87 71  Resp: 15 11 14 13   Temp: 99.1 F (37.3 C) 98.3 F (36.8 C) 97.8 F (36.6 C) 98.4 F (36.9 C)  TempSrc: Oral Oral Oral Oral  SpO2: 100% 99% 100% 100%  Weight:      Height:        General: Pt is alert, awake, not in acute distress Cardiovascular: RRR, S1/S2 +, no rubs, no gallops Respiratory: CTA bilaterally, no wheezing, no rhonchi Abdominal: Soft, NT, ND, bowel sounds + Extremities: no edema, no cyanosis    The results of significant diagnostics from this hospitalization (including imaging, microbiology, ancillary and laboratory) are listed below for reference.     Microbiology: Recent Results (from the past 240 hour(s))  Urine culture     Status: Abnormal   Collection Time: 05/25/18  3:24 PM  Result Value Ref Range Status   Specimen Description URINE, RANDOM  Final   Special Requests   Final    Normal Performed at [facility], [address], [place], [place] [address]    Culture MULTIPLE SPECIES PRESENT, SUGGEST RECOLLECTION (A)  Final   Report Status 05/26/2018 FINAL  Final  Blood culture (routine x 2)     Status: None (Preliminary result)   Collection Time: 05/25/18  4:04 PM  Result Value Ref Range Status   Specimen Description BLOOD RIGHT HAND  Final   Special Requests   Final    BOTTLES DRAWN AEROBIC AND ANAEROBIC Blood Culture results may not be optimal due to an inadequate volume of blood received in culture bottles   Culture NO GROWTH < 24 HOURS  Final   Report Status PENDING  Incomplete  Blood culture (routine x 2)     Status: None (Preliminary result)   Collection Time: 05/25/18  4:22 PM  Result Value Ref Range Status    Specimen Description BLOOD BLOOD LEFT ARM UPPER  Final   Special Requests   Final    BOTTLES DRAWN AEROBIC AND ANAEROBIC Blood Culture results may not be optimal due to an inadequate volume of blood received in culture bottles   Culture NO GROWTH < 24 HOURS  Final   Report Status PENDING  Incomplete  MRSA PCR Screening     Status: None   Collection Time: 05/25/18 11:18 PM  Result Value Ref Range Status   MRSA by PCR NEGATIVE NEGATIVE Final    Comment:        The GeneXpert MRSA Assay (FDA approved for NASAL specimens only), is one component of a comprehensive MRSA colonization surveillance program. It is not intended to diagnose MRSA infection nor to guide or monitor treatment for MRSA infections. Performed at [facility], [address] [place], [place] [address]      Labs: BNP (last 3 results) No results for input(s): BNP in the last 8760 hours. Basic Metabolic Panel: Recent Labs  Lab 05/25/18  2045 05/26/18 0025 05/26/18 0341 05/26/18 0610 05/27/18 0407  NA 133* 137 137 137 PENDING  K 3.5 3.5 3.2* 3.1* PENDING  CL 108 111 111 109 PENDING  CO2 18* 21* 21* 21* PENDING  GLUCOSE 149* 120* 74 53* 55*  BUN 13 12 12 12 8   CREATININE 0.89 0.74 0.73 0.69 0.55*  CALCIUM 7.6* 7.7* 7.7* 7.8* PENDING  MG  --   --   --  1.8  --    Liver Function Tests: Recent Labs  Lab 05/25/18 1604  AST 57*  ALT 81*  ALKPHOS 87  BILITOT 2.5*  PROT 6.5  ALBUMIN 3.7   No results for input(s): LIPASE, AMYLASE in the last 168 hours. No results for input(s): AMMONIA in the last 168 hours. CBC: Recent Labs  Lab 05/25/18 1500 05/25/18 1506 05/25/18 1724 05/26/18 0610  WBC 24.8*  --   --  10.8*  HGB 13.2 15.3 11.2* 10.2*  HCT 44.3 45.0 33.0* 30.5*  MCV 105.5*  --   --  94.1  PLT 370  --   --  249   Cardiac Enzymes: No results for input(s): CKTOTAL, CKMB, CKMBINDEX, TROPONINI in the last 168 hours. BNP: Invalid input(s): POCBNP CBG: Recent Labs  Lab 05/26/18 2010  05/27/18 0018 05/27/18 0418 05/27/18 0451 05/27/18 0816  GLUCAP 150* 121* 47* 129* 114*   D-Dimer No results for input(s): DDIMER in the last 72 hours. Hgb A1c Recent Labs    05/25/18 2045  HGBA1C 12.1*   Lipid Profile No results for input(s): CHOL, HDL, LDLCALC, TRIG, CHOLHDL, LDLDIRECT in the last 72 hours. Thyroid function studies No results for input(s): TSH, T4TOTAL, T3FREE, THYROIDAB in the last 72 hours.  Invalid input(s): FREET3 Anemia work up No results for input(s): VITAMINB12, FOLATE, FERRITIN, TIBC, IRON, RETICCTPCT in the last 72 hours. Urinalysis    Component Value Date/Time   COLORURINE STRAW (A) 05/25/2018 1501   APPEARANCEUR CLEAR 05/25/2018 1501   LABSPEC 1.018 05/25/2018 1501   PHURINE 5.0 05/25/2018 1501   GLUCOSEU >=500 (A) 05/25/2018 1501   HGBUR SMALL (A) 05/25/2018 1501   BILIRUBINUR NEGATIVE 05/25/2018 1501   BILIRUBINUR Negative 02/07/2016 1528   KETONESUR 80 (A) 05/25/2018 1501   PROTEINUR NEGATIVE 05/25/2018 1501   UROBILINOGEN 0.2 02/07/2016 1528   UROBILINOGEN 0.2 03/22/2015 0913   NITRITE NEGATIVE 05/25/2018 1501   LEUKOCYTESUR NEGATIVE 05/25/2018 1501   Sepsis Labs Invalid input(s): PROCALCITONIN,  WBC,  LACTICIDVEN Microbiology Recent Results (from the past 240 hour(s))  Urine culture     Status: Abnormal   Collection Time: 05/25/18  3:24 PM  Result Value Ref Range Status   Specimen Description URINE, RANDOM  Final   Special Requests   Final    Normal Performed at [facility], [address], [place], [place] [address]    Culture MULTIPLE SPECIES PRESENT, SUGGEST RECOLLECTION (A)  Final   Report Status 05/26/2018 FINAL  Final  Blood culture (routine x 2)     Status: None (Preliminary result)   Collection Time: 05/25/18  4:04 PM  Result Value Ref Range Status   Specimen Description BLOOD RIGHT HAND  Final   Special Requests   Final    BOTTLES DRAWN AEROBIC AND ANAEROBIC Blood Culture results may not be optimal due to  an inadequate volume of blood received in culture bottles   Culture NO GROWTH < 24 HOURS  Final   Report Status PENDING  Incomplete  Blood culture (routine x 2)  Status: None (Preliminary result)   Collection Time: 05/25/18  4:22 PM  Result Value Ref Range Status   Specimen Description BLOOD BLOOD LEFT ARM UPPER  Final   Special Requests   Final    BOTTLES DRAWN AEROBIC AND ANAEROBIC Blood Culture results may not be optimal due to an inadequate volume of blood received in culture bottles   Culture NO GROWTH < 24 HOURS  Final   Report Status PENDING  Incomplete  MRSA PCR Screening     Status: None   Collection Time: 05/25/18 11:18 PM  Result Value Ref Range Status   MRSA by PCR NEGATIVE NEGATIVE Final    Comment:        The GeneXpert MRSA Assay (FDA approved for NASAL specimens only), is one component of a comprehensive MRSA colonization surveillance program. It is not intended to diagnose MRSA infection nor to guide or monitor treatment for MRSA infections. Performed at [facility], [address] [place], [place] [address]     Please note: You were cared for by a hospitalist during your hospital stay. Once you are discharged, your primary care physician will handle any further medical issues. Please note that NO REFILLS for any discharge medications will be authorized once you are discharged, as it is imperative that you return to your primary care physician (or establish a relationship with a primary care physician if you do not have one) for your post hospital discharge needs so that they can reassess your need for medications and monitor your lab values.    Time coordinating discharge: 40 minutes  SIGNED:   Burnadette Pop, MD  [facility] 05/27/2018, 10:23 AM Pager [phone]  If 7PM-7AM, please contact night-coverage [email or web address] Password [email or web address]

## 2018-05-28 ENCOUNTER — Telehealth: Payer: Self-pay

## 2018-05-28 NOTE — Telephone Encounter (Signed)
Transition Care Management Follow-up Telephone Call  Date of discharge and from where: 1//2020, [facility]   How have you been since you were released from the hospital? He stated that he is " feeling pretty good."   Any questions or concerns? No questions/concerns reported  He said that he now has insurance through his employer effective 05/2018. He has received the dental card but needs to check with payroll about the medical card.   Items Reviewed:  Did the pt receive and understand the discharge instructions provided? He said that he has the instructions and has no questions   Medications obtained and verified? He said that he has both insulins but will need to pick up more. He also said that he will need to get lancets. No questions about his insulin. He also correctly verbalized  the doses that he is taking of novolog and lantus as well as the fact that he is to take the novolog only if eating > 50% of his meal.   Any new allergies since your discharge? None reported  Do you have support at home? Yes, from his mother and brother if needed. He said that unless he is sick he is very independent. He is working 34-38 hours/week.   Other (ie: DME, Home Health, etc) glucometer.  He reported that his blood sugars have been < 200 since he has been home.   Functional Questionnaire: (I = Independent and D = Dependent) ADL's: *independent   Follow up appointments reviewed:    PCP Hospital f/u appt confirmed? He said that he did not want to schedule an appointment at this time. He wants to check his schedule at work but he understands the importance of follow up with his PCP. He said that he would call the clinic after he receives his schedule.   Specialist Hospital f/u appt confirmed? None ordered at this time  Are transportation arrangements needed? no  If their condition worsens, is the pt aware to call  their PCP or go to the ED? Yes  Was the patient provided with contact  information for the PCP's office or ED? He has the  phone number.  Was the pt encouraged to call back with questions or concerns? Yes

## 2018-05-29 ENCOUNTER — Other Ambulatory Visit: Payer: Self-pay

## 2018-05-29 DIAGNOSIS — E1010 Type 1 diabetes mellitus with ketoacidosis without coma: Secondary | ICD-10-CM

## 2018-05-29 MED ORDER — TRUE METRIX METER W/DEVICE KIT: 1.0000 | PACK | Freq: Three times a day (TID) | 0 refills | Status: DC

## 2018-05-29 MED ORDER — TRUEPLUS LANCETS 28G MISC: 1 refills | Status: AC

## 2018-05-29 MED ORDER — GLUCOSE BLOOD VI STRP: ORAL_STRIP | 1 refills | Status: AC

## 2018-05-30 ENCOUNTER — Telehealth: Payer: Self-pay

## 2018-05-30 LAB — CULTURE, BLOOD (ROUTINE X 2)
Culture: NO GROWTH
Culture: NO GROWTH

## 2018-05-30 NOTE — Telephone Encounter (Signed)
Attempted to contact the patient to inform him that a new glucometer, supplies and lancets  have been ordered and are at [facility].  Call placed to # [phone] and the voicemail has not been set up. Call placed to # [phone] and a message was left requesting a call back to this CM # [phone]

## 2018-06-09 ENCOUNTER — Inpatient Hospital Stay (HOSPITAL_COMMUNITY)
Admission: EM | Admit: 2018-06-09 | Discharge: 2018-06-11 | DRG: 638 | Disposition: A | Discharge status: Home / Self Care | Payer: Self-pay | Attending: Family Medicine | Admitting: Family Medicine

## 2018-06-09 ENCOUNTER — Emergency Department (HOSPITAL_COMMUNITY): Payer: Self-pay

## 2018-06-09 ENCOUNTER — Other Ambulatory Visit: Payer: Self-pay

## 2018-06-09 ENCOUNTER — Encounter (HOSPITAL_COMMUNITY): Payer: Self-pay | Admitting: Emergency Medicine

## 2018-06-09 DIAGNOSIS — N179 Acute kidney failure, unspecified: Secondary | ICD-10-CM | POA: Diagnosis present

## 2018-06-09 DIAGNOSIS — Z833 Family history of diabetes mellitus: Secondary | ICD-10-CM

## 2018-06-09 DIAGNOSIS — Z794 Long term (current) use of insulin: Secondary | ICD-10-CM

## 2018-06-09 DIAGNOSIS — R000 Tachycardia, unspecified: Secondary | ICD-10-CM | POA: Diagnosis present

## 2018-06-09 DIAGNOSIS — E860 Dehydration: Secondary | ICD-10-CM | POA: Diagnosis present

## 2018-06-09 DIAGNOSIS — E1069 Type 1 diabetes mellitus with other specified complication: Secondary | ICD-10-CM

## 2018-06-09 DIAGNOSIS — F4323 Adjustment disorder with mixed anxiety and depressed mood: Secondary | ICD-10-CM | POA: Diagnosis present

## 2018-06-09 DIAGNOSIS — E1010 Type 1 diabetes mellitus with ketoacidosis without coma: Principal | ICD-10-CM | POA: Diagnosis present

## 2018-06-09 DIAGNOSIS — E876 Hypokalemia: Secondary | ICD-10-CM | POA: Diagnosis present

## 2018-06-09 DIAGNOSIS — IMO0002 Reserved for concepts with insufficient information to code with codable children: Secondary | ICD-10-CM | POA: Diagnosis present

## 2018-06-09 DIAGNOSIS — E1065 Type 1 diabetes mellitus with hyperglycemia: Secondary | ICD-10-CM | POA: Diagnosis present

## 2018-06-09 DIAGNOSIS — E1110 Type 2 diabetes mellitus with ketoacidosis without coma: Secondary | ICD-10-CM | POA: Diagnosis present

## 2018-06-09 DIAGNOSIS — Z87891 Personal history of nicotine dependence: Secondary | ICD-10-CM

## 2018-06-09 DIAGNOSIS — E871 Hypo-osmolality and hyponatremia: Secondary | ICD-10-CM | POA: Diagnosis present

## 2018-06-09 LAB — GLUCOSE, CAPILLARY
GLUCOSE-CAPILLARY: 120 mg/dL — AB (ref 70–99)
Glucose-Capillary: 157 mg/dL — ABNORMAL HIGH (ref 70–99)
Glucose-Capillary: 98 mg/dL (ref 70–99)
Glucose-Capillary: 124 mg/dL — ABNORMAL HIGH (ref 70–99)

## 2018-06-09 LAB — I-STAT TROPONIN, ED: Troponin i, poc: 0 ng/mL (ref 0.00–0.08)

## 2018-06-09 LAB — I-STAT CHEM 8, ED
BUN: 27 mg/dL — ABNORMAL HIGH (ref 6–20)
Calcium, Ion: 1.09 mmol/L — ABNORMAL LOW (ref 1.15–1.40)
Chloride: 108 mmol/L (ref 98–111)
Creatinine, Ser: 0.7 mg/dL (ref 0.61–1.24)
Glucose, Bld: 598 mg/dL (ref 70–99)
HCT: 43 % (ref 39.0–52.0)
Hemoglobin: 14.6 g/dL (ref 13.0–17.0)
POTASSIUM: 4.4 mmol/L (ref 3.5–5.1)
Sodium: 135 mmol/L (ref 135–145)
TCO2: 6 mmol/L — ABNORMAL LOW (ref 22–32)

## 2018-06-09 LAB — URINALYSIS, ROUTINE W REFLEX MICROSCOPIC
Bilirubin Urine: NEGATIVE
Glucose, UA: >=500 mg/dL — AB
KETONES UR: 80 mg/dL — AB
Leukocytes, UA: NEGATIVE
Nitrite: NEGATIVE
Protein, ur: NEGATIVE mg/dL
Specific Gravity, Urine: 1.018 (ref 1.005–1.030)
pH: 5 (ref 5.0–8.0)

## 2018-06-09 LAB — CBC
HCT: 46.3 % (ref 39.0–52.0)
Hemoglobin: 14.3 g/dL (ref 13.0–17.0)
MCH: 31.2 pg (ref 26.0–34.0)
MCHC: 30.9 g/dL (ref 30.0–36.0)
MCV: 101.1 fL — ABNORMAL HIGH (ref 80.0–100.0)
Platelets: 372 10*3/uL (ref 150–400)
RBC: 4.58 MIL/uL (ref 4.22–5.81)
RDW: 14.1 % (ref 11.5–15.5)
WBC: 14.4 10*3/uL — ABNORMAL HIGH (ref 4.0–10.5)
nRBC: 0 % (ref 0.0–0.2)

## 2018-06-09 LAB — CREATININE, SERUM
Creatinine, Ser: 1.53 mg/dL — ABNORMAL HIGH (ref 0.61–1.24)
GFR calc non Af Amer: 58 mL/min — ABNORMAL LOW (ref >60)

## 2018-06-09 LAB — LACTIC ACID, PLASMA: Lactic Acid, Venous: 1.3 mmol/L (ref 0.5–1.9)

## 2018-06-09 LAB — BASIC METABOLIC PANEL
BUN: 30 mg/dL — ABNORMAL HIGH (ref 6–20)
CO2: <7 mmol/L — ABNORMAL LOW (ref 22–32)
Calcium: 7.5 mg/dL — ABNORMAL LOW (ref 8.9–10.3)
Chloride: 95 mmol/L — ABNORMAL LOW (ref 98–111)
Creatinine, Ser: 1.44 mg/dL — ABNORMAL HIGH (ref 0.61–1.24)
GFR calc Af Amer: >60 mL/min (ref >60)
GFR calc non Af Amer: >60 mL/min (ref >60)
Glucose, Bld: 767 mg/dL (ref 70–99)
Potassium: 5 mmol/L (ref 3.5–5.1)
Sodium: 129 mmol/L — ABNORMAL LOW (ref 135–145)

## 2018-06-09 LAB — I-STAT CG4 LACTIC ACID, ED
Lactic Acid, Venous: 6.42 mmol/L (ref 0.5–1.9)
Lactic Acid, Venous: 5.47 mmol/L (ref 0.5–1.9)

## 2018-06-09 LAB — CBG MONITORING, ED
Glucose-Capillary: >600 mg/dL (ref 70–99)
Glucose-Capillary: 539 mg/dL (ref 70–99)
Glucose-Capillary: 531 mg/dL (ref 70–99)
Glucose-Capillary: 284 mg/dL — ABNORMAL HIGH (ref 70–99)
Glucose-Capillary: 257 mg/dL — ABNORMAL HIGH (ref 70–99)

## 2018-06-09 MED ORDER — SODIUM CHLORIDE 0.9 % IV BOLUS
1000.0000 mL | Freq: Once | INTRAVENOUS | Status: AC
  Administered 2018-06-09: 1000 mL via INTRAVENOUS

## 2018-06-09 MED ORDER — DEXTROSE-NACL 5-0.45 % IV SOLN
INTRAVENOUS | Status: DC
  Administered 2018-06-09: 125 mL/h via INTRAVENOUS
  Administered 2018-06-10: 05:00:00 via INTRAVENOUS

## 2018-06-09 MED ORDER — ONDANSETRON HCL 4 MG/2ML IJ SOLN
4.0000 mg | Freq: Four times a day (QID) | INTRAMUSCULAR | Status: DC | PRN
  Administered 2018-06-09: 4 mg via INTRAVENOUS
  Filled 2018-06-09: qty 2

## 2018-06-09 MED ORDER — POTASSIUM CHLORIDE 10 MEQ/100ML IV SOLN
10.0000 meq | INTRAVENOUS | Status: AC
  Administered 2018-06-09 (×2): 10 meq via INTRAVENOUS
  Filled 2018-06-09 (×2): qty 100

## 2018-06-09 MED ORDER — POLYETHYLENE GLYCOL 3350 17 G PO PACK: 17.0000 g | PACK | Freq: Every day | ORAL | Status: DC | PRN

## 2018-06-09 MED ORDER — INSULIN REGULAR(HUMAN) IN NACL 100-0.9 UT/100ML-% IV SOLN
INTRAVENOUS | Status: DC
  Administered 2018-06-10: 1.7 [IU]/h via INTRAVENOUS
  Administered 2018-06-10: 4.5 [IU]/h via INTRAVENOUS
  Filled 2018-06-09: qty 100

## 2018-06-09 MED ORDER — ACETAMINOPHEN 325 MG PO TABS: 650.0000 mg | ORAL_TABLET | Freq: Four times a day (QID) | ORAL | Status: DC | PRN

## 2018-06-09 MED ORDER — ACETAMINOPHEN 650 MG RE SUPP: 650.0000 mg | Freq: Four times a day (QID) | RECTAL | Status: DC | PRN

## 2018-06-09 MED ORDER — INSULIN REGULAR(HUMAN) IN NACL 100-0.9 UT/100ML-% IV SOLN
INTRAVENOUS | Status: DC
  Administered 2018-06-09: 5.4 [IU]/h via INTRAVENOUS
  Filled 2018-06-09: qty 100

## 2018-06-09 MED ORDER — HEPARIN SODIUM (PORCINE) 5000 UNIT/ML IJ SOLN: 5000.0000 [IU] | Freq: Three times a day (TID) | INTRAMUSCULAR | Status: DC

## 2018-06-09 MED ORDER — SODIUM CHLORIDE 0.9 % IV SOLN: INTRAVENOUS | Status: DC

## 2018-06-09 MED ORDER — DEXTROSE-NACL 5-0.45 % IV SOLN: INTRAVENOUS | Status: DC

## 2018-06-09 MED ORDER — SODIUM CHLORIDE 0.9 % IV BOLUS
1000.0000 mL | Freq: Once | INTRAVENOUS | Status: AC
  Administered 2018-06-09 (×2): 1000 mL via INTRAVENOUS

## 2018-06-09 MED ORDER — ONDANSETRON HCL 4 MG PO TABS: 4.0000 mg | ORAL_TABLET | Freq: Four times a day (QID) | ORAL | Status: DC | PRN

## 2018-06-09 MED ORDER — STERILE WATER FOR INJECTION IV SOLN
INTRAVENOUS | Status: DC
  Administered 2018-06-09: 14:00:00 via INTRAVENOUS
  Filled 2018-06-09: qty 850

## 2018-06-09 MED ORDER — ONDANSETRON HCL 4 MG/2ML IJ SOLN
4.0000 mg | Freq: Once | INTRAMUSCULAR | Status: AC
  Administered 2018-06-09: 4 mg via INTRAVENOUS
  Filled 2018-06-09: qty 2

## 2018-06-09 MED ORDER — HYDRALAZINE HCL 20 MG/ML IJ SOLN: 10.0000 mg | Freq: Four times a day (QID) | INTRAMUSCULAR | Status: DC | PRN

## 2018-06-09 MED ORDER — HEPARIN SODIUM (PORCINE) 5000 UNIT/ML IJ SOLN
5000.0000 [IU] | Freq: Three times a day (TID) | INTRAMUSCULAR | Status: DC
  Administered 2018-06-09: 5000 [IU] via SUBCUTANEOUS
  Filled 2018-06-09 (×4): qty 1

## 2018-06-09 MED ORDER — SODIUM CHLORIDE 0.45 % IV SOLN
Freq: Once | INTRAVENOUS | Status: AC
  Administered 2018-06-09: 18:00:00 via INTRAVENOUS

## 2018-06-09 NOTE — ED Provider Notes (Signed)
Pt seen and evaluated by PCCM, felt stable for admission to the step down unit with hospitalist   Azalia Bilis, MD 06/09/18 1642

## 2018-06-09 NOTE — H&P (Signed)
[facility] History and Physical  Garey Alleva EZM:[id number] DOB: Mar 22, 1986 DOA: 06/09/2018  Referring physician: ED  PCP: Charlott Rakes, MD   Chief Complaint: Fatigue, weakness and dehydration  HPI: [name] [name] is a 33 y.o. male with past medical history of recurrent DKA's, type 1 diabetes mellitus presented to the hospital with weakness fatigue and malaise for the last 2 to 3 days.  States that he has been compliant with his insulin regimen at home and has not missed it.  Denies fever, chills or rigors.  Denies any shortness of breath, cough, runny nose or sore throat.  Denies any chest pain.  Patient denies any urinary urgency, frequency or dysuria.  Denies any abdominal pain.  Slightly nauseated today.  Patient denies any recent travel or sick contacts.  Denies dyspnea cough or sputum production.  ED Course: In the ED, patient had significantly elevated blood glucose level of 767 with a gap of around 21 with low bicarb around 7, sodium was 129 on presentation.  Critical care team was consulted and recommended hospitalist admission after resuscitation.  Patient to at least 4 L of IV fluid bolus fluid boluses, received bicarb in the ED.  Review of Systems:  All systems were reviewed and were negative unless otherwise mentioned in the HPI  Past Medical History:  Diagnosis Date  . ARF (acute renal failure) ([place]) 2011   pt was on dialysis "for 1 day approx 2011, pt is no longer on dialysis (03/09/2017)  . DKA (diabetic ketoacidoses) ([place])    "several times" (03/09/2017)  . Type I diabetes mellitus ([place]) Dx 2012   Past Surgical History:  Procedure Laterality Date  . NO PAST SURGERIES      Social History:  reports that he quit smoking about 4 years ago. His smoking use included cigarettes. He has a 2.50 pack-year smoking history. He has never used smokeless tobacco. He reports current alcohol use. He reports that he does not use drugs.  No Known Allergies  Family History    Problem Relation Age of Onset  . Diabetes Father   . Diabetes Maternal Grandmother   . Heart disease Neg Hx   . Hypertension Neg Hx      Prior to Admission medications   Medication Sig Start Date End Date Taking? Authorizing Provider  insulin aspart (NOVOLOG) 100 UNIT/ML injection Inject 3 Units into the skin 3 (three) times daily before meals. 05/27/18 06/26/18 Yes Shelly Coss, MD  Insulin Glargine (LANTUS) 100 UNIT/ML Solostar Pen Inject 12 Units into the skin daily. 05/27/18 06/26/18 Yes Shelly Coss, MD  Blood Glucose Monitoring Suppl (TRUE METRIX METER) w/Device KIT 1 each by Other route 3 (three) times daily. Use as directed three times daily 05/29/18   Charlott Rakes, MD  glucose blood (TRUE METRIX BLOOD GLUCOSE TEST) test strip Use as instructed three times daily 05/29/18   Charlott Rakes, MD  TRUEPLUS LANCETS 28G MISC Use as directed three times daily 05/29/18   Charlott Rakes, MD    Physical Exam: Vitals:   06/09/18 1500 06/09/18 1530 06/09/18 1543 06/09/18 1630  BP: (!) 110/94 123/88  (!) 161/109  Pulse: (!) 137 (!) 141 (!) 140 (!) 129  Resp: 20  (!) 21 (!) 27  Temp:      TempSrc:      SpO2: 100% 99% 100% 100%  Weight:      Height:       Wt Readings from Last 3 Encounters:  06/09/18 54.4 kg  05/25/18 48.9 kg  04/22/18  48.2 kg   Body mass index is 19.37 kg/m.  General:  Average built, not in obvious distress, appears ill, thinly built HENT: Normocephalic, pupils equally reacting to light and accommodation.  No scleral pallor or icterus noted. Oral mucosa is dry Chest:  Clear breath sounds.  Diminished breath sounds bilaterally. No crackles or wheezes.  CVS: S1 &S2 heard. No murmur.  Regular rate and rhythm. Abdomen: Soft, nontender, nondistended.  Bowel sounds are heard.  Liver is not palpable, no abdominal mass palpated Extremities: No cyanosis, clubbing or edema.  Peripheral pulses are palpable. Psych: Alert, awake and oriented, normal mood CNS:  No cranial nerve  deficits.  Power equal in all extremities.   No cerebellar signs.   Skin: Warm and dry.  No rashes noted.  Labs on Admission:  Basic Metabolic Panel: Recent Labs  Lab 06/09/18 1322 06/09/18 1549  NA 129* 135  K 5.0 4.4  CL 95* 108  CO2 <7*  --   GLUCOSE 767* 598*  BUN 30* 27*  CREATININE 1.44* 0.70  CALCIUM 7.5*  --    Liver Function Tests: No results for input(s): AST, ALT, ALKPHOS, BILITOT, PROT, ALBUMIN in the last 168 hours. No results for input(s): LIPASE, AMYLASE in the last 168 hours. No results for input(s): AMMONIA in the last 168 hours. CBC: Recent Labs  Lab 06/09/18 1219 06/09/18 1549  WBC 14.4*  --   HGB 14.3 14.6  HCT 46.3 43.0  MCV 101.1*  --   PLT 372  --    Cardiac Enzymes: No results for input(s): CKTOTAL, CKMB, CKMBINDEX, TROPONINI in the last 168 hours.  BNP (last 3 results) No results for input(s): BNP in the last 8760 hours.  ProBNP (last 3 results) No results for input(s): PROBNP in the last 8760 hours.  CBG: Recent Labs  Lab 06/09/18 1213 06/09/18 1454 06/09/18 1556  GLUCAP >600* 539* 531*     Radiological Exams on Admission: Dg Chest Port 1 View  Result Date: 06/09/2018 CLINICAL DATA:  Weakness, DKA EXAM: PORTABLE CHEST 1 VIEW COMPARISON:  05/25/2018 FINDINGS: The heart size and mediastinal contours are within normal limits. Both lungs are clear. The visualized skeletal structures are unremarkable. IMPRESSION: No active disease. Electronically Signed   By: Franchot Gallo M.D.   On: 06/09/2018 13:23    EKG: Personally reviewed by me which shows sinus tachycardia  Assessment/Plan  Principal Problem:   Diabetic ketoacidosis without coma associated with type 1 diabetes mellitus ([place]) Active Problems:   Dehydration   DM (diabetes mellitus), type 1, uncontrolled (HCC)   Sinus tachycardia   DKA (diabetic ketoacidoses) ([place])  Diabetic ketoacidosis without coma with type 1 diabetes mellitus.  Continue on insulin regimen.  Will admit  the patient in the stepdown unit.  Will aggressively replenish fluids and electrolytes.  Follow DKA protocol.  Chest x-ray done in the ED was negative for any infiltrates.  Will check urinalysis.  Elevated blood pressure on presentation.  Denies history of hypertension.  Will put patient on as needed hydralazine for now.  Leukocytosis could be reactive.  Will monitor closely.  No antibiotics for now.  Check urinalysis.  Hyponatremia likely pseudohyponatremia from hyperglycemia.  Has started to improve with IV fluid hydration.  Volume depletion dehydration with sinus tachycardia.  Continue on volume resuscitation.  Lactic acidosis likely secondary to dehydration volume depletion.  Will closely monitor.  Repeat lactate.   Acute kidney injury likely secondary to volume depletion.  Starting to improve with IV fluids.  Will  closely monitor.  Consultant: Critical care team was consulted who recommended hospitalist admission  Code Status: Full code  DVT Prophylaxis: Heparin subcu  Antibiotics: None at this time  Family Communication: No one available at bedside  Disposition Plan: Home  Severity of Illness: The appropriate patient status for this patient is INPATIENT. Inpatient status is judged to be reasonable and necessary in order to provide the required intensity of service to ensure the patient's safety. [name] presenting symptoms, physical exam findings, and initial radiographic and laboratory data in the context of their chronic comorbidities is felt to place them at high risk for further clinical deterioration. Furthermore, it is not anticipated that the patient will be medically stable for discharge from the hospital within 2 midnights of admission. I certify that at the point of admission it is my clinical judgment that the patient will require inpatient hospital care spanning beyond 2 midnights from the point of admission due to high intensity of service, high risk for further  deterioration and high frequency of surveillance required.   Signed, Flora Lipps, MD [facility] 06/09/2018

## 2018-06-09 NOTE — ED Notes (Addendum)
CRITICAL VALUE STICKER  CRITICAL VALUE:  RECEIVER (on-site recipient of call):Cari Mazzie Brodrick  DATE & TIME NOTIFIED: 06/09/2018 1352  MESSENGER (representative from lab): Candee FurbishJoyce Mosely  MD NOTIFIED: Dr Judd Lienelo  TIME OF NOTIFICATION: 1355  RESPONSE: Insulin drip started

## 2018-06-09 NOTE — ED Notes (Addendum)
Notified EDP,Delo,MD, pt. I-stat CG4 Lactic acid results 5.47 and I-stat Chem 8 results Glucose 598 and RN, Irving Burton made aware.

## 2018-06-09 NOTE — ED Notes (Signed)
ED TO INPATIENT HANDOFF REPORT  Name/Age/Gender [name] [name] 33 y.o. male  Code Status    Code Status Orders  (From admission, onward)         Start     Ordered   06/09/18 1707  Full code  Continuous     06/09/18 1706        Code Status History    Date Active Date Inactive Code Status Order ID Comments User Context   05/25/2018 1800 05/27/2018 1532 Full Code [id number]  Burnadette Pop, MD ED   04/22/2018 1106 04/23/2018 2020 Full Code [id number]  Calvert Cantor, MD ED   04/15/2018 2113 04/17/2018 1617 Full Code [id number]  Barnetta Chapel, MD Inpatient   04/15/2018 2113 04/15/2018 2113 Full Code [id number]  Barnetta Chapel, MD Inpatient   01/28/2018 1138 01/29/2018 1614 Full Code [id number]  Briant Cedar, MD Inpatient   01/02/2018 1207 01/03/2018 1912 Full Code [id number]  Nyra Market, MD ED   11/30/2017 0052 12/01/2017 1346 Full Code [id number]  Eduard Clos, MD ED   09/20/2017 1144 09/22/2017 1433 Full Code [id number]  Lahoma Crocker, MD ED   08/21/2017 1146 08/23/2017 1849 Full Code [id number]  Alwyn Ren, MD ED   06/29/2017 1331 07/01/2017 1918 Full Code [id number]  Delaine Lame, MD ED   03/09/2017 1158 03/12/2017 1552 Full Code [id number]  Richarda Overlie, MD ED   10/29/2016 2127 10/30/2016 1701 Full Code [id number]  Jonah Blue, MD Inpatient   08/26/2016 1441 08/28/2016 1439 Full Code [id number]  Levie Heritage, DO Inpatient   07/11/2016 2138 07/12/2016 1951 Full Code [id number]  Michael Litter, MD Inpatient   07/11/2016 2138 07/11/2016 2138 Full Code [id number]  Michael Litter, MD Inpatient   05/29/2016 2319 05/31/2016 1759 Full Code [id number]  Therisa Doyne, MD Inpatient   03/09/2016 1135 03/11/2016 1625 Full Code [id number]  Calvert Cantor, MD ED   12/18/2015 1217 12/19/2015 1918 Full Code [id number]  Narda Bonds, MD Inpatient   12/01/2015 2310 12/03/2015 2130 Full Code [id number]  Eduard Clos, MD ED   07/22/2015 0927 07/24/2015 1812 Full Code [id number]   Otis Brace, MD ED   06/15/2015 2217 06/18/2015 1314 Full Code [id number]  Bobette Mo, MD Inpatient   06/06/2015 0200 06/07/2015 2026 Full Code [id number]  Briscoe Deutscher, MD ED   03/22/2015 1118 03/24/2015 1701 Full Code [id number]  Otis Brace, MD ED   09/30/2014 0701 10/02/2014 1746 Full Code [id number]  Lupita Leash, MD Inpatient   08/30/2014 1521 09/03/2014 1708 Full Code [id number]  Ky Barban, MD ED   08/13/2014 1956 08/15/2014 1806 Full Code [id number]  Osvaldo Shipper, MD ED   07/17/2014 0116 07/20/2014 1604 Full Code [id number]  Yevonne Pax, MD Inpatient   07/05/2014 1040 07/08/2014 1533 Full Code [id number]  Bernadene Person, NP ED   07/01/2014 0010 07/02/2014 1404 Full Code [id number]  Yevonne Pax, MD Inpatient   04/25/2014 0835 04/28/2014 2113 Full Code [id number]  Drema Dallas, MD Inpatient   04/24/2014 0618 04/25/2014 0835 Full Code [id number]  Eduard Clos, MD Inpatient   07/07/2013 2218 07/10/2013 1738 Full Code [id number]  Elease Etienne, MD Inpatient   03/11/2012 0046 03/22/2012 2111 Full Code [id number]  Reeves Forth, RN Inpatient   03/10/2012 2009 03/11/2012 0046 Full Code [id number]  Tarry Kos, MD ED      Home/SNF/Other Home  Chief Complaint Hyperglycemia  Level of Care/Admitting Diagnosis ED Disposition    ED Disposition Condition  Comment   Admit  Hospital Area: [facility] [[id number]]  Level of Care: Stepdown [14]  Admit to SDU based on following criteria: Other see comments  Comments: DKA  Diagnosis: DKA (diabetic ketoacidoses) [facility]) [161096]  Admitting Physician: Joycelyn Das [[id number]]  Attending Physician: Joycelyn Das [[id number]]  Estimated length of stay: past midnight tomorrow  Certification:: I certify this patient will need inpatient services for at least 2 midnights  PT Class (Do Not Modify): Inpatient [101]  PT Acc Code (Do Not Modify): Private [1]       Medical History Past Medical  History:  Diagnosis Date  . ARF (acute renal failure) (HCC) 2011   pt was on dialysis "for 1 day approx 2011, pt is no longer on dialysis (03/09/2017)  . DKA (diabetic ketoacidoses) (HCC)    "several times" (03/09/2017)  . Type I diabetes mellitus (HCC) Dx 2012    Allergies No Known Allergies  IV Location/Drains/Wounds Patient Lines/Drains/Airways Status   Active Line/Drains/Airways    Name:   Placement date:   Placement time:   Site:   Days:   Peripheral IV 06/09/18 Right Forearm   06/09/18    1244    Forearm   less than 1   Peripheral IV 06/09/18 Left;Upper Arm   06/09/18    1253    Arm   less than 1   Peripheral IV 06/09/18 Left Forearm   06/09/18    1254    Forearm   less than 1          Labs/Imaging Results for orders placed or performed during the hospital encounter of 06/09/18 (from the past 48 hour(s))  Urinalysis, Routine w reflex microscopic     Status: Abnormal   Collection Time: 06/09/18 12:08 PM  Result Value Ref Range   Color, Urine STRAW (A) YELLOW   APPearance CLEAR CLEAR   Specific Gravity, Urine 1.018 1.005 - 1.030   pH 5.0 5.0 - 8.0   Glucose, UA >=500 (A) NEGATIVE mg/dL   Hgb urine dipstick SMALL (A) NEGATIVE   Bilirubin Urine NEGATIVE NEGATIVE   Ketones, ur 80 (A) NEGATIVE mg/dL   Protein, ur NEGATIVE NEGATIVE mg/dL   Nitrite NEGATIVE NEGATIVE   Leukocytes, UA NEGATIVE NEGATIVE   RBC / HPF 0-5 0 - 5 RBC/hpf   WBC, UA 0-5 0 - 5 WBC/hpf   Bacteria, UA RARE (A) NONE SEEN   Mucus PRESENT     Comment: Performed at [facility], [address], [place], [place] [address]  CBG monitoring, ED     Status: Abnormal   Collection Time: 06/09/18 12:13 PM  Result Value Ref Range   Glucose-Capillary >600 (HH) 70 - 99 mg/dL   Comment 1 Notify RN    Comment 2 Document in Chart   CBC     Status: Abnormal   Collection Time: 06/09/18 12:19 PM  Result Value Ref Range   WBC 14.4 (H) 4.0 - 10.5 K/uL   RBC 4.58 4.22 - 5.81 MIL/uL   Hemoglobin  14.3 13.0 - 17.0 g/dL   HCT 13.0 86.5 - 78.4 %   MCV 101.1 (H) 80.0 - 100.0 fL   MCH 31.2 26.0 - 34.0 pg   MCHC 30.9 30.0 - 36.0 g/dL   RDW 69.6 29.5 - 28.4 %   Platelets 372 150 - 400 K/uL   nRBC 0.0 0.0 - 0.2 %    Comment: Performed at [facility], [address], [place], [place] [address]  I-stat troponin, ED     Status: None   Collection Time: 06/09/18 12:30 PM  Result Value Ref Range   Troponin i, poc 0.00 0.00 - 0.08 ng/mL   Comment 3            Comment: Due to the release kinetics of cTnI, a negative result within the first hours of the onset of symptoms does not rule out myocardial infarction with certainty. If myocardial infarction is still suspected, repeat the test at appropriate intervals.   I-Stat CG4 Lactic Acid, ED     Status: Abnormal   Collection Time: 06/09/18 12:33 PM  Result Value Ref Range   Lactic Acid, Venous 6.42 (HH) 0.5 - 1.9 mmol/L   Comment NOTIFIED PHYSICIAN   Blood gas, venous     Status: Abnormal (Preliminary result)   Collection Time: 06/09/18 12:40 PM  Result Value Ref Range   FIO2 21.00    Delivery systems ROOM AIR    pH, Ven 6.985 (LL) 7.250 - 7.430    Comment: RBV DOUGLAS DELO, MD AT 1253 BY KENNAN DEPUE,RRT,RCP ON 06/09/2018   pCO2, Ven BELOW REPORTABLE RANGE 44.0 - 60.0 mmHg    Comment: RBV Geoffery LyonsUGLAS DELO, MD AT 1253 BY KENNAN DEPUE,RRT,RCP ON 06/09/2018   pO2, Ven 83.9 (H) 32.0 - 45.0 mmHg   Bicarbonate PENDING 20.0 - 28.0 mmol/L   Acid-Base Excess PENDING 0.0 - 2.0 mmol/L   Acid-base deficit PENDING 0.0 - 2.0 mmol/L   O2 Saturation 90.2 %   Patient temperature 98.6    Collection site VEIN    Drawn by RN    Sample type VEIN     Comment: Performed at [facility], [address] [place], [place] [address]  Basic metabolic panel     Status: Abnormal   Collection Time: 06/09/18  1:22 PM  Result Value Ref Range   Sodium 129 (L) 135 - 145 mmol/L   Potassium 5.0 3.5 - 5.1 mmol/L   Chloride 95 (L)  98 - 111 mmol/L   CO2 <7 (L) 22 - 32 mmol/L   Glucose, Bld 767 (HH) 70 - 99 mg/dL    Comment: CRITICAL RESULT CALLED TO, READ BACK BY AND VERIFIED WITH: SIMPSON,C AT 1351 ON 06/09/2018 BY MOSLEY,J    BUN 30 (H) 6 - 20 mg/dL   Creatinine, Ser 3.241.44 (H) 0.61 - 1.24 mg/dL   Calcium 7.5 (L) 8.9 - 10.3 mg/dL   GFR calc non Af Amer >60 >60 mL/min   GFR calc Af Amer >60 >60 mL/min   Anion gap NOT CALCULATED 5 - 15    Comment: Performed at [facility], [address] [place], [place] [address]  CBG monitoring, ED     Status: Abnormal   Collection Time: 06/09/18  2:54 PM  Result Value Ref Range   Glucose-Capillary 539 (HH) 70 - 99 mg/dL   Comment 1 Document in Chart   Blood gas, venous     Status: Abnormal (Preliminary result)   Collection Time: 06/09/18  3:30 PM  Result Value Ref Range   FIO2 21.00    Delivery systems ROOM AIR    pH, Ven 7.015 (LL) 7.250 - 7.430    Comment: RBV DOUGLAS DELO,MD AT 1540 BY KENNAN DEPUE,RRT,RCP ON 06/09/2018   pCO2, Ven BELOW REPORTABLE RANGE 44.0 - 60.0 mmHg    Comment: RBV DOUGLAS DELO,MD AT 1540 BY KENNAN DEPUE,RRT,RCP ON 06/09/2018   pO2, Ven 74.8 (H) 32.0 - 45.0 mmHg   Bicarbonate PENDING  20.0 - 28.0 mmol/L   Acid-Base Excess PENDING 0.0 - 2.0 mmol/L   Acid-base deficit PENDING 0.0 - 2.0 mmol/L   O2 Saturation 89.9 %   Patient temperature 98.6    Collection site VEIN    Drawn by RN    Sample type VEIN     Comment: Performed at [facility], [address] [place], [place] [address]  I-Stat CG4 Lactic Acid, ED     Status: Abnormal   Collection Time: 06/09/18  3:37 PM  Result Value Ref Range   Lactic Acid, Venous 5.47 (HH) 0.5 - 1.9 mmol/L   Comment NOTIFIED PHYSICIAN   I-stat Chem 8, ED     Status: Abnormal   Collection Time: 06/09/18  3:49 PM  Result Value Ref Range   Sodium 135 135 - 145 mmol/L   Potassium 4.4 3.5 - 5.1 mmol/L   Chloride 108 98 - 111 mmol/L   BUN 27 (H) 6 - 20 mg/dL   Creatinine, Ser  9.67 0.61 - 1.24 mg/dL   Glucose, Bld 893 (HH) 70 - 99 mg/dL   Calcium, Ion 8.10 (L) 1.15 - 1.40 mmol/L   TCO2 6 (L) 22 - 32 mmol/L   Hemoglobin 14.6 13.0 - 17.0 g/dL   HCT 17.5 10.2 - 58.5 %   Comment NOTIFIED PHYSICIAN   CBG monitoring, ED     Status: Abnormal   Collection Time: 06/09/18  3:56 PM  Result Value Ref Range   Glucose-Capillary 531 (HH) 70 - 99 mg/dL   Comment 1 Notify RN   CBG monitoring, ED     Status: Abnormal   Collection Time: 06/09/18  5:05 PM  Result Value Ref Range   Glucose-Capillary 284 (H) 70 - 99 mg/dL  CBG monitoring, ED     Status: Abnormal   Collection Time: 06/09/18  6:15 PM  Result Value Ref Range   Glucose-Capillary 257 (H) 70 - 99 mg/dL   Dg Chest Port 1 View  Result Date: 06/09/2018 CLINICAL DATA:  Weakness, DKA EXAM: PORTABLE CHEST 1 VIEW COMPARISON:  05/25/2018 FINDINGS: The heart size and mediastinal contours are within normal limits. Both lungs are clear. The visualized skeletal structures are unremarkable. IMPRESSION: No active disease. Electronically Signed   By: Marlan Palau M.D.   On: 06/09/2018 13:23   EKG Interpretation  Date/Time:  Sunday June 09 2018 12:06:34 EST Ventricular Rate:  147 PR Interval:    QRS Duration: 74 QT Interval:  260 QTC Calculation: 407 R Axis:   88 Text Interpretation:  Sinus tachycardia Anteroseptal infarct, old Baseline wander in lead(s) V1 V4 Confirmed by DeLo, Douglas ([id number]) on 06/09/2018 12:38:58 PM   Pending Labs Unresulted Labs (From admission, onward)    Start     Ordered   06/09/18 1900  Lactic acid, plasma  Once,   R     06/09/18 1726   06/09/18 1706  Magnesium  Once,   R     06/09/18 1706   06/09/18 1706  Phosphorus  Once,   R     06/09/18 1706   06/09/18 1705  Basic metabolic panel  STAT Now then every 4 hours ,   STAT     01 /19/20 1706   06/09/18 1705  Creatinine, serum  (heparin)  Once,   R    Comments:  Baseline for heparin therapy IF NOT ALREADY DRAWN.    06/09/18 1706    06/09/18 1659  Basic metabolic panel  ONCE - STAT,   STAT  06/09/18 1659   06/09/18 1306  Blood culture (routine x 2)  BLOOD CULTURE X 2,   STAT     06/09/18 1305   Signed and Held  CBC  (heparin)  Once,   R    Comments:  Baseline for heparin therapy IF NOT ALREADY DRAWN.  Notify MD if PLT < 100 K.    Signed and Held   Signed and Held  Creatinine, serum  (heparin)  Once,   R    Comments:  Baseline for heparin therapy IF NOT ALREADY DRAWN.    Signed and Held   Signed and Held  Basic metabolic panel  Tomorrow morning,   R     Signed and Held   Signed and Held  CBC  Tomorrow morning,   R     Signed and Held          Vitals/Pain Today's Vitals   06/09/18 1630 06/09/18 1700 06/09/18 1715 06/09/18 1730  BP: (!) 161/109 (!) 150/107  (!) 152/103  Pulse: (!) 129 (!) 116 (!) 119 (!) 111  Resp: (!) 27 17 16 14   Temp:      TempSrc:      SpO2: 100% 100% 100% 100%  Weight:      Height:      PainSc:        Isolation Precautions No active isolations  Medications Medications  dextrose 5 %-0.45 % sodium chloride infusion (has no administration in time range)  insulin regular, human (MYXREDLIN) 100 units/ 100 mL infusion (7.9 Units/hr Intravenous Rate/Dose Change 06/09/18 1818)  sodium bicarbonate 150 mEq in sterile water 1,000 mL infusion ( Intravenous Rate/Dose Verify 06/09/18 1538)  ondansetron (ZOFRAN) tablet 4 mg ( Oral See Alternative 06/09/18 1807)    Or  ondansetron (ZOFRAN) injection 4 mg (4 mg Intravenous Given 06/09/18 1807)  0.9 %  sodium chloride infusion (has no administration in time range)  dextrose 5 %-0.45 % sodium chloride infusion (has no administration in time range)  insulin regular, human (MYXREDLIN) 100 units/ 100 mL infusion (has no administration in time range)  heparin injection 5,000 Units (has no administration in time range)  0.9 %  sodium chloride infusion (has no administration in time range)  potassium chloride 10 mEq in 100 mL IVPB (has no  administration in time range)  hydrALAZINE (APRESOLINE) injection 10 mg (has no administration in time range)  sodium chloride 0.9 % bolus 1,000 mL (0 mLs Intravenous Stopped 06/09/18 1357)  ondansetron (ZOFRAN) injection 4 mg (4 mg Intravenous Given 06/09/18 1258)  sodium chloride 0.9 % bolus 1,000 mL (0 mLs Intravenous Stopped 06/09/18 1357)  sodium chloride 0.9 % bolus 1,000 mL (0 mLs Intravenous Stopped 06/09/18 1537)  sodium chloride 0.9 % bolus 1,000 mL (0 mLs Intravenous Stopped 06/09/18 1716)  0.45 % sodium chloride infusion ( Intravenous New Bag/Given 06/09/18 1742)    Mobility walks with person assist

## 2018-06-09 NOTE — ED Notes (Signed)
Bed: WA05 Expected date:  Expected time:  Means of arrival:  Comments: Hyperglycemia 

## 2018-06-09 NOTE — Consult Note (Addendum)
PULMONARY / CRITICAL CARE MEDICINE   NAME:  Le Faulcon, MRN:  [id number], DOB:  [birth year], LOS: 0 ADMISSION DATE:  06/09/2018, CONSULTATION DATE:  1/19 REFERRING MD:  edp, CHIEF COMPLAINT:  dka  BRIEF HISTORY:    33 yobm IDDM/ freq dka in ER since noon  1/19 with severe dka initially with ams but on arrival of ccm team talking on phone, drinking ice water and watching football game. Says feeling much better since arrival but still very thirsty, denies ongoing chest tightness or sob though did have these on arrival.  PCCM service asked to eval for critical care admit.      HISTORY OF PRESENT ILLNESS   Last admit: 33 y.o.malewith medical history significant ofdiabetes type 1, depression/adjustment disorder who presents from home with complaints of generalized weakness. He has been admitted in the past for DKA. Her mother was also concerned about his mental status. Patient stateshe was feeling very weaksince last one and half days.Hehad his frequency ofUrination.Found to have DKA on presentation. Admitted for further management.  He was then started on insulin drip.  His anion gap closed.  Insulin drip discontinued.  Diabetic coordinator consulted. He is stable for discharge home today.  Following problems were addressed during his hospitalization:   Diabetic ketoacidosis:insulin dripstopped.Started on long-acting insulin and sliding scale. Started on diet.  Type 1 diabetes mellitus:On insulinlantus and novologat home. Hemoglobin A1Cof 12.1. Diabetic coordinator consulted. He will be discharged on 12 units of Lantus at bedtime and NovoLog 3 units 3 times daily with meals.  Lactic acidosis/leukocytosis: Most likely these are from hemoconcentration from dehydration.Resolved.  Acute kidney injury:Secondary to dehydration from increased urination secondary to glycosuria.StoppedIV fluids. Kidney function ison baseline.  Hypokalemia: Supplemented.    Adjustment disorder with mixed anxiety and depressed mood:Currently not on medication at home. He sayshe mood is  stable currently.    was better then 2-3 days pta indolent onset  progressive gen weakness/ sob s n or V though no appetite am of admit.     No obvious day to day or daytime variability or assoc excess/ purulent sputum or mucus plugs or hemoptysis  , subjective wheeze or overt sinus or hb symptoms.   sleepiing  without nocturnal  or early am exacerbation  of respiratory  c/o's or need for noct saba. Also denies any obvious fluctuation of symptoms with weather or environmental changes or other aggravating or alleviating factors except as outlined above   No unusual exposure hx or h/o childhood pna/ asthma or knowledge of premature birth.  Current Allergies, Complete Past Medical History, Past Surgical History, Family History, and Social History were reviewed in [employer] record.  ROS  The following are not active complaints unless bolded Hoarseness, sore throat, dysphagia, dental problems, itching, sneezing,  nasal congestion or discharge of excess mucus or purulent secretions, ear ache,   fever, chills, sweats, unintended wt loss or wt gain, classically pleuritic or exertional cp,  orthopnea pnd or arm/hand swelling  or leg swelling, presyncope, palpitations, abdominal pain, anorexia, nausea, vomiting, diarrhea  or change in bowel habits or change in bladder habits, change in stools or change in urine, dysuria, hematuria,  rash, arthralgias, visual complaints, headache, numbness, weakness or ataxia or problems with walking or coordination,  change in mood or  memory.        Current Meds  Medication Sig  . insulin aspart (NOVOLOG) 100 UNIT/ML injection Inject 3 Units into the skin 3 (three) times daily before meals.  Marland Kitchen  Insulin Glargine (LANTUS) 100 UNIT/ML Solostar Pen Inject 12 Units into the skin daily.      SUBJECTIVE:  Presently c/o still  feel weak, no longer sob or chest discomfort/smothering sensation which has resolved   CONSTITUTIONAL: BP 123/88   Pulse (!) 140   Temp 97.7 F (36.5 C) (Oral)   Resp (!) 21   Ht 5' 6" (1.676 m)   Wt 54.4 kg   SpO2 100%   BMI 19.37 kg/m     No intake/output data recorded.        PHYSICAL EXAM: General:  Thin bm severely dehydrated appearing on RA  Neuro:  Alert/approp HEENT:  Mucosa dry / dentiion poor  Cardiovascular:  RRR no S3    Lungs:  Clear to A and P Abdomen:  Soft/ benign Musculoskeletal:  Warm no calf tenderness or joint restrictions Skin:  No rash     I personally reviewed images and agree with radiology impression as follows:  CXR:   06/09/2018  No active disease.    ASSESSMENT AND PLAN   1) Severe DKA leading to profound dehydration still not enough fluid in vs uop since arrival  - just beginning to catch up now and clearly responding to rx per EDP  2)  HBP  from NS and serum Na corrected for glucose is > 145 so rec change to 1/2 NS and continue x 2 liters    3) AG acidosis with pos ketones/ minimal lactic acidosis with no evidence sepsis so no need for abx and d/c bicarb drip when this liter in   No need for escalation of care beyond insulin drip/ IV fluids to which glucose can be added once cbgs down per protocol.  Would place in Step down/ I will make elink aware of admit and ask for monitoring once he gets to unit.   The patient is critically ill with multiple organ systems failure and requires high complexity decision making for assessment and support, frequent evaluation and titration of therapies, application of advanced monitoring technologies and extensive interpretation of multiple databases. Critical Care Time devoted to patient care services described in this note is 45 minutes.    Christinia Gully, MD Pulmonary and [place] [phone] After 5:30 PM or weekends, use Beeper [phone]       LABS   Glucose Recent Labs  Lab 06/09/18 1213 06/09/18 1454 06/09/18 1556  GLUCAP >600* 539* 531*    BMET Recent Labs  Lab 06/09/18 1322 06/09/18 1549  NA 129* 135  K 5.0 4.4  CL 95* 108  CO2 <7*  --   BUN 30* 27*  CREATININE 1.44* 0.70  GLUCOSE 767* 598*    Liver Enzymes No results for input(s): AST, ALT, ALKPHOS, BILITOT, ALBUMIN in the last 168 hours.  Electrolytes Recent Labs  Lab 06/09/18 1322  CALCIUM 7.5*    CBC Recent Labs  Lab 06/09/18 1219 06/09/18 1549  WBC 14.4*  --   HGB 14.3 14.6  HCT 46.3 43.0  PLT 372  --     ABG No results for input(s): PHART, PCO2ART, PO2ART in the last 168 hours.  Coag's No results for input(s): APTT, INR in the last 168 hours.  Sepsis Markers Recent Labs  Lab 06/09/18 1233 06/09/18 1537  LATICACIDVEN 6.42* 5.47*    Cardiac Enzymes No results for input(s): TROPONINI, PROBNP in the last 168 hours.  PAST MEDICAL HISTORY :   He  has a past medical history of ARF (acute renal  failure) ([place]) (2011), DKA (diabetic ketoacidoses) ([place]), and Type I diabetes mellitus ([place]) (Dx 2012).  PAST SURGICAL HISTORY:  He  has a past surgical history that includes No past surgeries.  No Known Allergies  No current facility-administered medications on file prior to encounter.    Current Outpatient Medications on File Prior to Encounter  Medication Sig  . insulin aspart (NOVOLOG) 100 UNIT/ML injection Inject 3 Units into the skin 3 (three) times daily before meals.  . Insulin Glargine (LANTUS) 100 UNIT/ML Solostar Pen Inject 12 Units into the skin daily.  . Blood Glucose Monitoring Suppl (TRUE METRIX METER) w/Device KIT 1 each by Other route 3 (three) times daily. Use as directed three times daily  . glucose blood (TRUE METRIX BLOOD GLUCOSE TEST) test strip Use as instructed three times daily  . TRUEPLUS LANCETS 28G MISC Use as directed three times daily    FAMILY HISTORY:   His family history includes Diabetes in his father and  maternal grandmother. There is no history of Heart disease or Hypertension.  SOCIAL HISTORY:  He  reports that he quit smoking about 4 years ago. His smoking use included cigarettes. He has a 2.50 pack-year smoking history. He has never used smokeless tobacco. He reports current alcohol use. He reports that he does not use drugs.

## 2018-06-09 NOTE — ED Triage Notes (Signed)
Patient BIB [facility] from home for hyperglycemia, increased weakness x2 days, tachycardic, and increased respirations. Pt has hx of DKA. Pt has not eaten today but took his insulin.

## 2018-06-09 NOTE — ED Provider Notes (Signed)
[place] DEPT Provider Note   CSN: [id number] Arrival date & time: 06/09/18  1144     History   Chief Complaint Chief Complaint  Patient presents with  . Hyperglycemia    HPI [name] [name] is a 33 y.o. male.  Patient is a 33 year old male with past medical history of diabetes, cannabis use.  He presents today for evaluation of weakness, fatigue, and generalized malaise worsening over the past 3 days.  He reports being compliant with his medications including insulin, however did not feel well enough to eat this morning.  He denies fevers or chills.  He denies chest pain.  He states that he "feels like he is barely breathing".  Patient was recently admitted for diabetic ketoacidosis earlier this month.  The history is provided by the patient.  Hyperglycemia  Severity:  Severe Onset quality:  Sudden Timing:  Constant Progression:  Worsening Chronicity:  Recurrent   Past Medical History:  Diagnosis Date  . ARF (acute renal failure) ([place]) 2011   pt was on dialysis "for 1 day approx 2011, pt is no longer on dialysis (03/09/2017)  . DKA (diabetic ketoacidoses) ([place])    "several times" (03/09/2017)  . Type I diabetes mellitus [facility]) Dx 2012    Patient Active Problem List   Diagnosis Date Noted  . DKA (diabetic ketoacidoses) ([place]) 05/25/2018  . Nausea & vomiting 11/29/2017  . Diabetes mellitus type 1, uncontrolled, with complications ([place])   . Noncompliance by refusing service   . DKA, type 1 ([place]) 03/09/2017  . Hypophosphatemia 08/27/2016  . AKI (acute kidney injury) ([place]) 07/11/2016  . Hyperkalemia 03/09/2016  . Leucocytosis 03/09/2016  . Diabetic ketoacidosis without coma associated with type 1 diabetes mellitus ([place])   . Non-intractable vomiting with nausea   . Chest pain 12/18/2015  . Marijuana use 07/22/2015  . Proteinuria with type 1 diabetes mellitus ([place]) 07/22/2015  . Hypertension complicating diabetes ([place]) 07/22/2015  .  Lactic acidosis   . Uncontrolled type 1 diabetes mellitus with ketoacidosis without coma ([place])   . Hypocalcemia   . Acute renal failure ([place])   . Hypokalemia   . Hypomagnesemia   . Abnormal EKG 06/15/2015  . Sinus tachycardia   . Tachypnea   . Tinea pedis 12/17/2014  . Adjustment disorder with mixed anxiety and depressed mood 09/03/2014  . Protein-calorie malnutrition, severe ([place]) 07/06/2014  . DM (diabetes mellitus), type 1, uncontrolled ([place]) 05/01/2014  . HLD (hyperlipidemia) 04/26/2014  . Substance abuse ([place])   . Dehydration 07/07/2013  . Fatigue 03/10/2012    Past Surgical History:  Procedure Laterality Date  . NO PAST SURGERIES          Home Medications    Prior to Admission medications   Medication Sig Start Date End Date Taking? Authorizing Provider  Blood Glucose Monitoring Suppl (TRUE METRIX METER) w/Device KIT 1 each by Other route 3 (three) times daily. Use as directed three times daily 05/29/18   Charlott Rakes, MD  glucose blood (TRUE METRIX BLOOD GLUCOSE TEST) test strip Use as instructed three times daily 05/29/18   Charlott Rakes, MD  insulin aspart (NOVOLOG) 100 UNIT/ML injection Inject 3 Units into the skin 3 (three) times daily before meals. 05/27/18 06/26/18  Shelly Coss, MD  Insulin Glargine (LANTUS) 100 UNIT/ML Solostar Pen Inject 12 Units into the skin daily. 05/27/18 06/26/18  Shelly Coss, MD  TRUEPLUS LANCETS 28G MISC Use as directed three times daily 05/29/18   Charlott Rakes, MD    [facility]  History Family History  Problem Relation Age of Onset  . Diabetes Father   . Diabetes Maternal Grandmother   . Heart disease Neg Hx   . Hypertension Neg Hx     Social History Social History   Tobacco Use  . Smoking status: Former Smoker    Packs/day: 0.25    Years: 10.00    Pack years: 2.50    Types: Cigarettes    Last attempt to quit: 09/19/2013    Years since quitting: 4.7  . Smokeless tobacco: Never Used  Substance Use Topics  . Alcohol use: Yes     Alcohol/week: 0.0 standard drinks    Comment: 03/09/2017 "normally I don't drink; drank 3 shots liquor 2 days ago"  . Drug use: No     Allergies   Patient has no known allergies.   Review of Systems Review of Systems  All other systems reviewed and are negative.    Physical Exam Updated Vital Signs BP (!) 143/103 (BP Location: Right Arm)   Pulse (!) 147 Comment: informed the nurse  Temp 97.7 F (36.5 C) (Oral)   Resp (!) 22   Ht '5\' 6"'$  (1.676 m)   Wt 54.4 kg   SpO2 100%   BMI 19.37 kg/m   Physical Exam Vitals signs and nursing note reviewed.  Constitutional:      Appearance: He is well-developed. He is ill-appearing.     Comments: Patient is a thin, ill-appearing 33 year old male.  He is awake and alert, but somewhat slow to respond.  HENT:     Head: Normocephalic and atraumatic.     Mouth/Throat:     Mouth: Mucous membranes are dry.  Neck:     Musculoskeletal: Normal range of motion and neck supple.  Cardiovascular:     Rate and Rhythm: Normal rate and regular rhythm.     Heart sounds: No murmur. No friction rub.  Pulmonary:     Effort: Pulmonary effort is normal. No respiratory distress.     Breath sounds: Normal breath sounds. No wheezing or rales.  Abdominal:     General: Bowel sounds are normal. There is no distension.     Palpations: Abdomen is soft.     Tenderness: There is no abdominal tenderness.  Musculoskeletal: Normal range of motion.  Skin:    General: Skin is warm and dry.  Neurological:     General: No focal deficit present.     Mental Status: He is oriented to person, place, and time.     Coordination: Coordination normal.      ED Treatments / Results  Labs (all labs ordered are listed, but only abnormal results are displayed) Labs Reviewed  CBG MONITORING, ED - Abnormal; Notable for the following components:      Result Value   Glucose-Capillary >600 (*)    All other components within normal limits  BASIC METABOLIC PANEL  CBC    URINALYSIS, ROUTINE W REFLEX MICROSCOPIC  BLOOD GAS, VENOUS  I-STAT CG4 LACTIC ACID, ED  I-STAT TROPONIN, ED    EKG None  Radiology No results found.  Procedures Procedures (including critical care time)  Medications Ordered in ED Medications  sodium chloride 0.9 % bolus 1,000 mL (has no administration in time range)  ondansetron (ZOFRAN) injection 4 mg (has no administration in time range)  dextrose 5 %-0.45 % sodium chloride infusion (has no administration in time range)  insulin regular, human (MYXREDLIN) 100 units/ 100 mL infusion (has no administration in time range)  sodium chloride  0.9 % bolus 1,000 mL (1,000 mLs Intravenous New Bag/Given 06/09/18 1226)     Initial Impression / Assessment and Plan / ED Course  I have reviewed the triage vital signs and the nursing notes.  Pertinent labs & imaging results that were available during my care of the patient were reviewed by me and considered in my medical decision making (see chart for details).  Patient with history of insulin-dependent diabetes presenting with complaints of weakness, increased thirst and urination, generalized malaise over the past 3 days.  Work-up shows laboratory studies consistent with diabetic ketoacidosis.  His initial pH was 6.985, bicarbonate is undetectable, and blood sugar was nearly 800.  He was initially awake, but somewhat slow to respond and appeared somewhat confused.  Patient started on glucose stabilizer, given 3 L of normal saline, and started on bicarb drip.  Repeat blood gas shows pH now of 7.015 and little improvement in the blood sugar and bicarbonate level.  Patient's care discussed with Dr. Lake Bells from critical care.  He feels as though the patient should be evaluated by critical care for possible admission.  [name] mental status does appear to be improving.  CRITICAL CARE Performed by: Veryl Speak Total critical care time: 70 minutes Critical care time was exclusive of  separately billable procedures and treating other patients. Critical care was necessary to treat or prevent imminent or life-threatening deterioration. Critical care was time spent personally by me on the following activities: development of treatment plan with patient and/or surrogate as well as nursing, discussions with consultants, evaluation of patient's response to treatment, examination of patient, obtaining history from patient or surrogate, ordering and performing treatments and interventions, ordering and review of laboratory studies, ordering and review of radiographic studies, pulse oximetry and re-evaluation of patient's condition.   Final Clinical Impressions(s) / ED Diagnoses   Final diagnoses:  None    ED Discharge Orders    None       Veryl Speak, MD 06/09/18 1550

## 2018-06-10 ENCOUNTER — Other Ambulatory Visit: Payer: Self-pay

## 2018-06-10 DIAGNOSIS — E1010 Type 1 diabetes mellitus with ketoacidosis without coma: Secondary | ICD-10-CM | POA: Diagnosis not present

## 2018-06-10 LAB — GLUCOSE, CAPILLARY
Glucose-Capillary: 116 mg/dL — ABNORMAL HIGH (ref 70–99)
Glucose-Capillary: 122 mg/dL — ABNORMAL HIGH (ref 70–99)
Glucose-Capillary: 133 mg/dL — ABNORMAL HIGH (ref 70–99)
Glucose-Capillary: 133 mg/dL — ABNORMAL HIGH (ref 70–99)
Glucose-Capillary: 138 mg/dL — ABNORMAL HIGH (ref 70–99)
Glucose-Capillary: 143 mg/dL — ABNORMAL HIGH (ref 70–99)
Glucose-Capillary: 157 mg/dL — ABNORMAL HIGH (ref 70–99)
Glucose-Capillary: 165 mg/dL — ABNORMAL HIGH (ref 70–99)
Glucose-Capillary: 169 mg/dL — ABNORMAL HIGH (ref 70–99)
Glucose-Capillary: 171 mg/dL — ABNORMAL HIGH (ref 70–99)
Glucose-Capillary: 193 mg/dL — ABNORMAL HIGH (ref 70–99)
Glucose-Capillary: 196 mg/dL — ABNORMAL HIGH (ref 70–99)
Glucose-Capillary: 210 mg/dL — ABNORMAL HIGH (ref 70–99)
Glucose-Capillary: 213 mg/dL — ABNORMAL HIGH (ref 70–99)
Glucose-Capillary: 220 mg/dL — ABNORMAL HIGH (ref 70–99)
Glucose-Capillary: 226 mg/dL — ABNORMAL HIGH (ref 70–99)

## 2018-06-10 LAB — CBC
HEMATOCRIT: 30 % — AB (ref 39.0–52.0)
HEMOGLOBIN: 10.2 g/dL — AB (ref 13.0–17.0)
MCH: 31.5 pg (ref 26.0–34.0)
MCHC: 34 g/dL (ref 30.0–36.0)
MCV: 92.6 fL (ref 80.0–100.0)
Platelets: 196 10*3/uL (ref 150–400)
RBC: 3.24 MIL/uL — ABNORMAL LOW (ref 4.22–5.81)
RDW: 13.6 % (ref 11.5–15.5)
WBC: 10.2 10*3/uL (ref 4.0–10.5)
nRBC: 0 % (ref 0.0–0.2)

## 2018-06-10 LAB — BLOOD GAS, VENOUS
ACID-BASE DEFICIT: 2.6 mmol/L — AB (ref 0.0–2.0)
Bicarbonate: 21.7 mmol/L (ref 20.0–28.0)
FIO2: 21
FIO2: 21
O2 Saturation: 90.2 %
O2 Saturation: 89.9 %
O2 Saturation: 92.3 %
PATIENT TEMPERATURE: 98.6
PH VEN: 6.985 — AB (ref 7.250–7.430)
Patient temperature: 98.6
Patient temperature: 98.6
pCO2, Ven: 37.6 mmHg — ABNORMAL LOW (ref 44.0–60.0)
pH, Ven: 7.015 — CL (ref 7.250–7.430)
pH, Ven: 7.378 (ref 7.250–7.430)
pO2, Ven: 83.9 mmHg — ABNORMAL HIGH (ref 32.0–45.0)
pO2, Ven: 74.8 mmHg — ABNORMAL HIGH (ref 32.0–45.0)
pO2, Ven: 60.7 mmHg — ABNORMAL HIGH (ref 32.0–45.0)

## 2018-06-10 LAB — BASIC METABOLIC PANEL
ANION GAP: 12 (ref 5–15)
Anion gap: 10 (ref 5–15)
Anion gap: 9 (ref 5–15)
Anion gap: 8 (ref 5–15)
BUN: 20 mg/dL (ref 6–20)
BUN: 16 mg/dL (ref 6–20)
BUN: 12 mg/dL (ref 6–20)
BUN: 9 mg/dL (ref 6–20)
CALCIUM: 6.6 mg/dL — AB (ref 8.9–10.3)
CHLORIDE: 108 mmol/L (ref 98–111)
CO2: 16 mmol/L — ABNORMAL LOW (ref 22–32)
CO2: 15 mmol/L — ABNORMAL LOW (ref 22–32)
CO2: 18 mmol/L — ABNORMAL LOW (ref 22–32)
CO2: 20 mmol/L — AB (ref 22–32)
CREATININE: 0.73 mg/dL (ref 0.61–1.24)
CREATININE: 0.53 mg/dL — AB (ref 0.61–1.24)
Calcium: 6.4 mg/dL — CL (ref 8.9–10.3)
Calcium: 6.3 mg/dL — CL (ref 8.9–10.3)
Calcium: 6.8 mg/dL — ABNORMAL LOW (ref 8.9–10.3)
Chloride: 105 mmol/L (ref 98–111)
Chloride: 105 mmol/L (ref 98–111)
Chloride: 105 mmol/L (ref 98–111)
Creatinine, Ser: 0.76 mg/dL (ref 0.61–1.24)
Creatinine, Ser: 0.68 mg/dL (ref 0.61–1.24)
GFR calc Af Amer: >60 mL/min (ref >60)
GFR calc Af Amer: >60 mL/min (ref >60)
GFR calc Af Amer: >60 mL/min (ref >60)
GFR calc Af Amer: >60 mL/min (ref >60)
GFR calc non Af Amer: >60 mL/min (ref >60)
GFR calc non Af Amer: >60 mL/min (ref >60)
GFR calc non Af Amer: >60 mL/min (ref >60)
GFR calc non Af Amer: >60 mL/min (ref >60)
Glucose, Bld: 113 mg/dL — ABNORMAL HIGH (ref 70–99)
Glucose, Bld: 188 mg/dL — ABNORMAL HIGH (ref 70–99)
Glucose, Bld: 156 mg/dL — ABNORMAL HIGH (ref 70–99)
Glucose, Bld: 164 mg/dL — ABNORMAL HIGH (ref 70–99)
POTASSIUM: 4 mmol/L (ref 3.5–5.1)
Potassium: 3.7 mmol/L (ref 3.5–5.1)
Potassium: 2.8 mmol/L — ABNORMAL LOW (ref 3.5–5.1)
Potassium: 2.9 mmol/L — ABNORMAL LOW (ref 3.5–5.1)
Sodium: 134 mmol/L — ABNORMAL LOW (ref 135–145)
Sodium: 132 mmol/L — ABNORMAL LOW (ref 135–145)
Sodium: 132 mmol/L — ABNORMAL LOW (ref 135–145)
Sodium: 133 mmol/L — ABNORMAL LOW (ref 135–145)

## 2018-06-10 LAB — MAGNESIUM
Magnesium: 1.6 mg/dL — ABNORMAL LOW (ref 1.7–2.4)
Magnesium: 2.3 mg/dL (ref 1.7–2.4)

## 2018-06-10 LAB — ALBUMIN
Albumin: 3 g/dL — ABNORMAL LOW (ref 3.5–5.0)
Albumin: 3.3 g/dL — ABNORMAL LOW (ref 3.5–5.0)

## 2018-06-10 LAB — PHOSPHORUS: Phosphorus: 1.4 mg/dL — ABNORMAL LOW (ref 2.5–4.6)

## 2018-06-10 MED ORDER — CALCIUM GLUCONATE-NACL 1-0.675 GM/50ML-% IV SOLN
1.0000 g | Freq: Once | INTRAVENOUS | Status: AC
  Administered 2018-06-10: 1000 mg via INTRAVENOUS
  Filled 2018-06-10: qty 50

## 2018-06-10 MED ORDER — CALCIUM CARBONATE ANTACID 500 MG PO CHEW
800.0000 mg | CHEWABLE_TABLET | Freq: Two times a day (BID) | ORAL | Status: DC
  Administered 2018-06-10 – 2018-06-11 (×3): 800 mg via ORAL
  Filled 2018-06-10 (×3): qty 4

## 2018-06-10 MED ORDER — INSULIN ASPART 100 UNIT/ML ~~LOC~~ SOLN
0.0000 [IU] | Freq: Three times a day (TID) | SUBCUTANEOUS | Status: DC
  Administered 2018-06-10: 2 [IU] via SUBCUTANEOUS
  Administered 2018-06-10: 3 [IU] via SUBCUTANEOUS

## 2018-06-10 MED ORDER — MAGNESIUM SULFATE 2 GM/50ML IV SOLN
2.0000 g | Freq: Once | INTRAVENOUS | Status: AC
  Administered 2018-06-10: 2 g via INTRAVENOUS
  Filled 2018-06-10: qty 50

## 2018-06-10 MED ORDER — GLUCERNA SHAKE PO LIQD
237.0000 mL | Freq: Three times a day (TID) | ORAL | Status: DC
  Administered 2018-06-10 – 2018-06-11 (×3): 237 mL via ORAL
  Filled 2018-06-10 (×5): qty 237

## 2018-06-10 MED ORDER — POTASSIUM CHLORIDE CRYS ER 20 MEQ PO TBCR
40.0000 meq | EXTENDED_RELEASE_TABLET | ORAL | Status: AC
  Administered 2018-06-10 (×2): 40 meq via ORAL
  Filled 2018-06-10 (×2): qty 2

## 2018-06-10 MED ORDER — POTASSIUM CHLORIDE 10 MEQ/100ML IV SOLN
10.0000 meq | INTRAVENOUS | Status: AC
  Administered 2018-06-10 (×3): 10 meq via INTRAVENOUS
  Filled 2018-06-10 (×3): qty 100

## 2018-06-10 MED ORDER — SODIUM PHOSPHATES 45 MMOLE/15ML IV SOLN
30.0000 mmol | Freq: Once | INTRAVENOUS | Status: AC
  Administered 2018-06-10: 30 mmol via INTRAVENOUS
  Filled 2018-06-10: qty 10

## 2018-06-10 MED ORDER — LACTATED RINGERS IV SOLN
INTRAVENOUS | Status: DC
  Administered 2018-06-10: 15:00:00 via INTRAVENOUS

## 2018-06-10 MED ORDER — ORAL CARE MOUTH RINSE: 15.0000 mL | Freq: Two times a day (BID) | OROMUCOSAL | Status: DC

## 2018-06-10 MED ORDER — INSULIN ASPART 100 UNIT/ML ~~LOC~~ SOLN
3.0000 [IU] | Freq: Three times a day (TID) | SUBCUTANEOUS | Status: DC
  Administered 2018-06-10 – 2018-06-11 (×3): 3 [IU] via SUBCUTANEOUS

## 2018-06-10 MED ORDER — INSULIN ASPART 100 UNIT/ML ~~LOC~~ SOLN: 0.0000 [IU] | Freq: Every day | SUBCUTANEOUS | Status: DC

## 2018-06-10 MED ORDER — POTASSIUM CHLORIDE 10 MEQ/100ML IV SOLN
10.0000 meq | INTRAVENOUS | Status: AC
  Administered 2018-06-10 (×4): 10 meq via INTRAVENOUS
  Filled 2018-06-10 (×4): qty 100

## 2018-06-10 MED ORDER — INSULIN GLARGINE 100 UNIT/ML ~~LOC~~ SOLN
15.0000 [IU] | Freq: Two times a day (BID) | SUBCUTANEOUS | Status: DC
  Administered 2018-06-10 – 2018-06-11 (×3): 15 [IU] via SUBCUTANEOUS
  Filled 2018-06-10 (×4): qty 0.15

## 2018-06-10 MED ORDER — ADULT MULTIVITAMIN W/MINERALS CH
1.0000 | ORAL_TABLET | Freq: Every day | ORAL | Status: DC
  Administered 2018-06-10 – 2018-06-11 (×2): 1 via ORAL
  Filled 2018-06-10 (×2): qty 1

## 2018-06-10 NOTE — Progress Notes (Signed)
NAME:  [name] [name], MRN:  [id number], DOB:  11-20-1985, LOS: 1 ADMISSION DATE:  06/09/2018, CONSULTATION DATE:  1/20 REFERRING MD:  EDP, CHIEF COMPLAINT:  DKA   Brief History   32 yobm IDDM/ freq dka admitted 1/19 with severe dka initially with ams and PCCM called for ICU admit.    Past Medical History   has a past medical history of ARF (acute renal failure) (HCC) (2011), DKA (diabetic ketoacidoses) (HCC), and Type I diabetes mellitus (HCC) (Dx 2012).   Significant Hospital Events     Consults:    Procedures:    Significant Diagnostic Tests:    Micro Data:  BC x 2 1/19>>>  Antimicrobials:  none   Interim history/subjective:  Feeling much better.  Remains on insulin gtt awaiting anion gap closure per rotocol.  Awake, alert, appropriate, hemodynamically stable  Objective   Blood pressure 130/90, pulse 87, temperature 97.9 F (36.6 C), temperature source Oral, resp. rate 10, height 5\' 6"  (1.676 m), weight 54.4 kg, SpO2 98 %.        Intake/Output Summary (Last 24 hours) at 06/10/2018 1007 Last data filed at 06/10/2018 0906 Gross per 24 hour  Intake 7539.16 ml  Output 2100 ml  Net 5439.16 ml   Filed Weights   06/09/18 1206  Weight: 54.4 kg    Examination: General: thin, chronically ill appearing male, NAD  HENT: mm dry, no JVD  Lungs: resps even non labored on [place], clear  Cardiovascular: s1s2 rrr Abdomen: soft, non tender  Extremities: warm and dry, no edema  Neuro: awake, alert, appropriate, MAE   Resolved Hospital Problem list     Assessment & Plan:   Severe Diabetic ketoacidosis with profound dehydration and TYO:MAYOKHTXH.  No evidence sepsis Anion gap acidosis  PLAN -  Continue insulin gtt  Await gap closure - am labs still pending  Gentle volume   leukocytosis -- suspect reactive. CXR neg. U/a does not suggest UTI PLAN -  BC pending  Monitor off abx   Type 1 diabetes mellitus PLAN -  DM coordinator consulted  Last HgbA1c 12.6 on  08/22/2017 On insulinlantus and novologat home.   Lactic acidosis/leukocytosis: Most likely these are from hemoconcentration from dehydration.Resolved.  AKI - resolved  PLAN -  F/u chem    Adjustment disorder with mixed anxiety and depressed mood:Currently not on medication at home. PLAN -  Per primary   Ensure SD status  Remains on TRH service - PCCM signing off please call back if needed    Labs   CBC: Recent Labs  Lab 06/09/18 1219 06/09/18 1549 06/10/18 0739  WBC 14.4*  --  10.2  HGB 14.3 14.6 10.2*  HCT 46.3 43.0 30.0*  MCV 101.1*  --  92.6  PLT 372  --  196    Basic Metabolic Panel: Recent Labs  Lab 06/09/18 1322 06/09/18 1549 06/09/18 1705 06/09/18 2308 06/10/18 0510  NA 129* 135  --  134* 132*  K 5.0 4.4  --  3.7 4.0  CL 95* 108  --  108 105  CO2 <7*  --   --  16* 15*  GLUCOSE 767* 598*  --  113* 188*  BUN 30* 27*  --  20 16  CREATININE 1.44* 0.70 1.53* 0.73 0.76  CALCIUM 7.5*  --   --  6.6* 6.4*  MG  --   --   --  1.6*  --   PHOS  --   --   --  1.4*  --  GFR: Estimated Creatinine Clearance: 100.1 mL/min (by C-G formula based on SCr of 0.76 mg/dL). Recent Labs  Lab 06/09/18 1219 06/09/18 1233 06/09/18 1537 06/09/18 1946 06/10/18 0739  WBC 14.4*  --   --   --  10.2  LATICACIDVEN  --  6.42* 5.47* 1.3  --     Liver Function Tests: Recent Labs  Lab 06/10/18 0739  ALBUMIN 3.0*   No results for input(s): LIPASE, AMYLASE in the last 168 hours. No results for input(s): AMMONIA in the last 168 hours.  ABG    Component Value Date/Time   PHART 6.953 (LL) 04/22/2018 0915   PCO2ART  BELOW REPORTABLE RANGE 04/22/2018 0915   PO2ART 149 (H) 04/22/2018 0915   HCO3 21.7 06/10/2018 0810   TCO2 6 (L) 06/09/2018 1549   ACIDBASEDEF 2.6 (H) 06/10/2018 0810   O2SAT 92.3 06/10/2018 0810     Coagulation Profile: No results for input(s): INR, PROTIME in the last 168 hours.  Cardiac Enzymes: No results for input(s): CKTOTAL, CKMB,  CKMBINDEX, TROPONINI in the last 168 hours.  HbA1C: Hgb A1c MFr Bld  Date/Time Value Ref Range Status  05/25/2018 08:45 PM 12.1 (H) 4.8 - 5.6 % Final    Comment:    (NOTE) Pre diabetes:          5.7%-6.4% Diabetes:              >6.4% Glycemic control for   <7.0% adults with diabetes   04/22/2018 11:06 AM 14.0 (H) 4.8 - 5.6 % Final    Comment:    (NOTE) Pre diabetes:          5.7%-6.4% Diabetes:              >6.4% Glycemic control for   <7.0% adults with diabetes     CBG: Recent Labs  Lab 06/10/18 0430 06/10/18 0535 06/10/18 0638 06/10/18 0748 06/10/18 0851  GLUCAP 171* 213* 193* 210* 138*      Critical care time: 30 mins    Dirk Dress, NP 06/10/2018  10:07 AM Pager: [phone] or [phone]

## 2018-06-10 NOTE — Progress Notes (Signed)
PROGRESS NOTE    [name] [name]  OIB:[id number] DOB: [birth year] DOA: 06/09/2018 PCP: Hoy Register, MD   Brief Narrative:  [name] [name] is a 33 y.o. male with past medical history of recurrent DKA's, type 1 diabetes mellitus presented to the hospital with weakness fatigue and malaise for the last 2 to 3 days.  States that he has been compliant with his insulin regimen at home and has not missed it.  Denies fever, chills or rigors.  Denies any shortness of breath, cough, runny nose or sore throat.  Denies any chest pain.  Patient denies any urinary urgency, frequency or dysuria.  Denies any abdominal pain.  Slightly nauseated today.  Patient denies any recent travel or sick contacts.  Denies dyspnea cough or sputum production.  Assessment & Plan:   Principal Problem:   Diabetic ketoacidosis without coma associated with type 1 diabetes mellitus (HCC) Active Problems:   Dehydration   DM (diabetes mellitus), type 1, uncontrolled (HCC)   Sinus tachycardia   DKA (diabetic ketoacidoses) (HCC)   Diabetic ketoacidosis without coma with type 1 diabetes mellitus A1c 12.1 05/25/2018 He notes compliance with his home regimen of lantus 15 units BID and novolog 8 units TID Noted decreased appetite a few days prior to presentation, but no other concerning sx Gap is closed, bicarb slightly low.  VBG without acidemia. Transition to Lantus 15 units twice daily as well as 3 units with meals, increased her home regimen as tolerated Diabetes coordinator, dietitian Chest x-ray negative, cultures pending, UA without nitrite or leukocyte esterase  Anxiety  Adjustment Disorder: continue to monitor, will discuss additionally with pt Hypokalemia: replace IV and PO, magnesium wnl Hypocalcemia: replace IV and PO, mag wnl, check PTH and vitamin D NAGMA: switch to LR, continue to monitor Elevated blood pressure on presentation: improved, follow Leukocytosis: resolved  Hyponatremia: improved  Volume  depletion: improved, continue IVF  Lactic acidosis: resolved Acute kidney injury: resolved  DVT prophylaxis: heparin Code Status: full  Family Communication: none at bedside Disposition Plan: likely within 24 hours, pending improvement in electrolytes and stabilization on home insulin regimen  Consultants:   CCM  Procedures:   none  Antimicrobials:  Anti-infectives (From admission, onward)   None        Subjective: 15 units lantus BID novolog 8 units TID Noticed decreased appetite Then yesterday his breathing changed  Objective: Vitals:   06/09/18 2350 06/10/18 0215 06/10/18 0300 06/10/18 0413  BP:  125/83 130/90   Pulse:  84 87   Resp:  10 10   Temp: 98.2 F (36.8 C)   99.1 F (37.3 C)  TempSrc: Oral   Oral  SpO2:  100% 98%   Weight:      Height:        Intake/Output Summary (Last 24 hours) at 06/10/2018 0801 Last data filed at 06/10/2018 0754 Gross per 24 hour  Intake 7448.14 ml  Output 1900 ml  Net 5548.14 ml   Filed Weights   06/09/18 1206  Weight: 54.4 kg    Examination:  General exam: Appears calm and comfortable  Respiratory system: Clear to auscultation. Respiratory effort normal. Cardiovascular system: S1 & S2 heard, RRR. No JVD, murmurs, rubs, gallops or clicks. No pedal edema. Gastrointestinal system: Abdomen is nondistended, soft and nontender. No organomegaly or masses felt. Normal bowel sounds heard. Central nervous system: Alert and oriented. No focal neurological deficits. Extremities: Symmetric 5 x 5 power. Skin: No rashes, lesions or ulcers Psychiatry: Judgement and insight appear normal. Mood &  affect appropriate.     Data Reviewed: I have personally reviewed following labs and imaging studies  CBC: Recent Labs  Lab 06/09/18 1219 06/09/18 1549  WBC 14.4*  --   HGB 14.3 14.6  HCT 46.3 43.0  MCV 101.1*  --   PLT 372  --    Basic Metabolic Panel: Recent Labs  Lab 06/09/18 1322 06/09/18 1549 06/09/18 1705  06/09/18 2308 06/10/18 0510  NA 129* 135  --  134* 132*  K 5.0 4.4  --  3.7 4.0  CL 95* 108  --  108 105  CO2 <7*  --   --  16* 15*  GLUCOSE 767* 598*  --  113* 188*  BUN 30* 27*  --  20 16  CREATININE 1.44* 0.70 1.53* 0.73 0.76  CALCIUM 7.5*  --   --  6.6* 6.4*  MG  --   --   --  1.6*  --   PHOS  --   --   --  1.4*  --    GFR: Estimated Creatinine Clearance: 100.1 mL/min (by C-G formula based on SCr of 0.76 mg/dL). Liver Function Tests: No results for input(s): AST, ALT, ALKPHOS, BILITOT, PROT, ALBUMIN in the last 168 hours. No results for input(s): LIPASE, AMYLASE in the last 168 hours. No results for input(s): AMMONIA in the last 168 hours. Coagulation Profile: No results for input(s): INR, PROTIME in the last 168 hours. Cardiac Enzymes: No results for input(s): CKTOTAL, CKMB, CKMBINDEX, TROPONINI in the last 168 hours. BNP (last 3 results) No results for input(s): PROBNP in the last 8760 hours. HbA1C: No results for input(s): HGBA1C in the last 72 hours. CBG: Recent Labs  Lab 06/10/18 0320 06/10/18 0430 06/10/18 0535 06/10/18 0638 06/10/18 0748  GLUCAP 169* 171* 213* 193* 210*   Lipid Profile: No results for input(s): CHOL, HDL, LDLCALC, TRIG, CHOLHDL, LDLDIRECT in the last 72 hours. Thyroid Function Tests: No results for input(s): TSH, T4TOTAL, FREET4, T3FREE, THYROIDAB in the last 72 hours. Anemia Panel: No results for input(s): VITAMINB12, FOLATE, FERRITIN, TIBC, IRON, RETICCTPCT in the last 72 hours. Sepsis Labs: Recent Labs  Lab 06/09/18 1233 06/09/18 1537 06/09/18 1946  LATICACIDVEN 6.42* 5.47* 1.3    Recent Results (from the past 240 hour(s))  Blood culture (routine x 2)     Status: None (Preliminary result)   Collection Time: 06/09/18  1:06 PM  Result Value Ref Range Status   Specimen Description   Final    BLOOD LEFT FOREARM Blood Culture adequate volume BOTTLES DRAWN AEROBIC AND ANAEROBIC Performed at [facility], [address] [place], [place] [address]    Special Requests   Final    NONE Performed at [facility], [address] [place], [place] [address]    Culture   Final    NO GROWTH < 12 HOURS Performed at [facility], [address] [place], [place] [address]    Report Status PENDING  Incomplete  Blood culture (routine x 2)     Status: None (Preliminary result)   Collection Time: 06/09/18  1:11 PM  Result Value Ref Range Status   Specimen Description   Final    BLOOD LEFT ANTECUBITAL Blood Culture adequate volume BOTTLES DRAWN AEROBIC AND ANAEROBIC Performed at [facility], [address] [place], [place] [address]    Special Requests   Final    NONE Performed at [facility], [address] [place], [place]  [address]    Culture   Final    NO GROWTH < 12 HOURS Performed at [facility], [address] [place], [place] [address]    Report Status PENDING  Incomplete         Radiology Studies: Dg Chest Port 1 View  Result Date: 06/09/2018 CLINICAL DATA:  Weakness, DKA EXAM: PORTABLE CHEST 1 VIEW COMPARISON:  05/25/2018 FINDINGS: The heart size and mediastinal contours are within normal limits. Both lungs are clear. The visualized skeletal structures are unremarkable. IMPRESSION: No active disease. Electronically Signed   By: Marlan Palau M.D.   On: 06/09/2018 13:23        Scheduled Meds: . heparin  5,000 Units Subcutaneous Q8H  . mouth rinse  15 mL Mouth Rinse BID   Continuous Infusions: . sodium chloride Stopped (06/09/18 2012)  . dextrose 5 % and 0.45% NaCl 125 mL/hr at 06/10/18 0700  . insulin 4.5 Units/hr (06/10/18 0754)  . sodium phosphate  Dextrose 5% IVPB 43 mL/hr at 06/10/18 0700     LOS: 1 day    Time spent: over 30 min    Lacretia Nicks, MD [facility] Pager [phone]   If 7PM-7AM, please contact night-coverage [email or web address] Password [email or web address] 06/10/2018, 8:01 AM

## 2018-06-10 NOTE — Progress Notes (Signed)
Inpatient Diabetes Program Recommendations  AACE/ADA: New Consensus Statement on Inpatient Glycemic Control (2015)  Target Ranges:  Prepandial:   less than 140 mg/dL      Peak postprandial:   less than 180 mg/dL (1-2 hours)      Critically ill patients:  140 - 180 mg/dL   Lab Results  Component Value Date   GLUCAP 157 (H) 06/10/2018   HGBA1C 12.1 (H) 05/25/2018    Review of Glycemic Control  Diabetes history: DM1 Outpatient Diabetes medications: Lantus 12 units QD, Novolog 3 units tidwc Current orders for Inpatient glycemic control: Lantus 15 units bid, Novolog 0-9 units tidwc and hs + 3 units tidwc  HgbA1C - 12.1% - uncontrolled  Spoke with pt about HgbA1C of 12.1%. Pt states he takes his insulin and checks his blood sugars everyday. Does not know why his blood sugars are so hard to control.   8th ED visits/Hospitalizaitons in past 6 months.  Inpatient Diabetes Program Recommendations:     Decrease Lantus to 15 units QD. (Pt was d/ced on 05/27/2018 on Lantus 12 units QHS)  [facility].  Counseled by the DM Coordinators many times in the pastabout the importance of good glucose control at home. He has been educated during each admission on DM management, insulins, etc. He lacks resources for management and likely will not be able to see endocrinologist due to lack of insurance.  Continue to follow.   Thank you. Ailene Ardshonda Katrin Grabel, RD, LDN, CDE Inpatient Diabetes Coordinator [phone]

## 2018-06-10 NOTE — Care Management Note (Signed)
Case Management Note  Patient Details  Name: [name] [name] MRN: [id number] Date of Birth: 1986-02-24  Subjective/Objective:                  Discharge Readiness Return to top of Diabetes RRG - ISC  Discharge readiness is indicated by patient meeting Recovery Milestones, including ALL of the following: ? Hemodynamic stability  yes ? No precipitating cause identified, or cause manageable in outpatient setting  no ? Acidosis absent  No bld gas=pCO2-37.6/pO2=60.7/Acid-base=2.6 ? Mental status at baseline yes ? Blood glucose under acceptable control no 210 ? Electrolyte abnormalities absent or acceptable for next level of care Na=132/ Co2=15, glucose at 0555=188/ ? Renal function at baseline or acceptable for next level of care yes ? Dehydration absent yes   Iv insulin drip, Iv calcium gluconate,iv ns at 125cc/hrs Level of care=sdu not ready for med-surg floor Action/Plan: Following for progression of care. Following for cm needs none present at this time.  Expected Discharge Date:                  Expected Discharge Plan:     In-House Referral:     Discharge planning Services     Post Acute Care Choice:    Choice offered to:     DME Arranged:    DME Agency:     HH Arranged:    HH Agency:     Status of Service:     If discussed at [employer] Meetings, dates discussed:    Additional Comments:  Golda AcreDavis, Rhonda Lynn, RN 06/10/2018, 9:39 AM

## 2018-06-10 NOTE — Plan of Care (Signed)
  Problem: Education: Goal: Individualized Educational Video(s) Outcome: Progressing   Problem: Cardiac: Goal: Ability to maintain an adequate cardiac output will improve Outcome: Progressing   Problem: Health Behavior/Discharge Planning: Goal: Ability to identify and utilize available resources and services will improve Outcome: Progressing Goal: Ability to manage health-related needs will improve Outcome: Progressing   Problem: Fluid Volume: Goal: Ability to achieve a balanced intake and output will improve Outcome: Progressing   Problem: Metabolic: Goal: Ability to maintain appropriate glucose levels will improve Outcome: Progressing

## 2018-06-10 NOTE — Progress Notes (Signed)
Initial Nutrition Assessment  DOCUMENTATION CODES:   Severe malnutrition in context of chronic illness  INTERVENTION:  - Will order Glucerna Shake TID, each supplement provides 220 kcal and 10 grams of protein. - Will order daily multivitamin with minerals.  - Encourage PO intakes. - Will continue to monitor for additional education needs.    NUTRITION DIAGNOSIS:   Severe Malnutrition related to chronic illness(type 1 DM with recurrent DKA) as evidenced by severe fat depletion, severe muscle depletion.  GOAL:   Patient will meet greater than or equal to 90% of their needs  MONITOR:   PO intake, Supplement acceptance, Weight trends, Labs  REASON FOR ASSESSMENT:   Malnutrition Screening Tool  ASSESSMENT:   33 y.o. male with past medical history of recurrent DKA's, type 1 DM. He presented to the ED with weakness, fatigue, and malaise for 2-3 days. States that he has been compliant with his insulin regimen at home and has not missed it.  Patient was slightly nauseated when he presented to the ED.  BMI indicates normal weight. Patient seen by DM Coordinator shortly before RD visit. Patient was seen by another RD on 1/6 during a previous admission. Information provided by patient today was in line with information provided to RD on 1/6.  Patient reports that he usually has a good appetite and eats 3 meals/day. Breakfast and dinner are full meals and lunch is usually something small, like a sandwich or a nutrition shake. The only times he has a poor appetite and does not eat well is when he is feeling sick. He reports feeling much better today and is interested in [employer] being ordered.   Per chart review, current weight is 120 lb. Patient feels that he has lost 2-3 lb recently. Per chart review, weight had been fairly stable (102-107 lb) from 01/03/18-05/25/18.   Medications reviewed; sliding scale novolog, 3 units novolog TID, 15 units lantus BID, 2 g IV Mg sulfate x1 run  1/20, 10 mEq IV KCl x7 runs 1/20 and x2 runs 1/19, 40 mEq K-Dur x2 doses 1/20, 30 mmol IV NaPhos x1 run 1/20. Labs reviewed; CBGs: 122-213 mg/dl today, Na: 774 mmol/l, K: 2.8 mmol/l, creatinine: 0.53 mg/dl, Ca: 6.3 mg/dl. IVF; D5-1/2 NS @ 125 ml/hr (510 kcal).      NUTRITION - FOCUSED PHYSICAL EXAM:    Most Recent Value  Orbital Region  Moderate depletion  Upper Arm Region  Severe depletion  Thoracic and Lumbar Region  Severe depletion  Buccal Region  Mild depletion  Temple Region  Mild depletion  Clavicle Bone Region  Severe depletion  Clavicle and Acromion Bone Region  Severe depletion  Scapular Bone Region  Moderate depletion  Dorsal Hand  Mild depletion  Patellar Region  Unable to assess  Anterior Thigh Region  Unable to assess  Posterior Calf Region  Unable to assess  Edema (RD Assessment)  Unable to assess  Hair  Reviewed  Eyes  Reviewed  Mouth  Reviewed  Skin  Reviewed  Nails  Reviewed       Diet Order:   Diet Order            Diet Carb Modified Fluid consistency: Thin; Room service appropriate? Yes  Diet effective now              EDUCATION NEEDS:   Education needs have been addressed  Skin:  Skin Assessment: Reviewed RN Assessment  Last BM:  PTA/unknown  Height:   Ht Readings from Last 1 Encounters:  06/09/18 5\' 6"  (1.676 m)    Weight:   Wt Readings from Last 1 Encounters:  06/09/18 54.4 kg    Ideal Body Weight:  64.54 kg  BMI:  Body mass index is 19.37 kg/m.  Estimated Nutritional Needs:   Kcal:  2536-6440 kcal  Protein:  80-90 grams  Fluid:  >/= 2 L/day     Trenton Gammon, MS, RD, LDN, [facility] Inpatient Clinical Dietitian Pager # [phone] After hours/weekend pager # [phone]

## 2018-06-11 LAB — PTH, INTACT AND CALCIUM
Calcium, Total (PTH): 6.7 mg/dL (ref 8.7–10.2)
PTH: 43 pg/mL (ref 15–65)

## 2018-06-11 LAB — COMPREHENSIVE METABOLIC PANEL
ALT: 41 U/L (ref 0–44)
AST: 95 U/L — ABNORMAL HIGH (ref 15–41)
Albumin: 2.8 g/dL — ABNORMAL LOW (ref 3.5–5.0)
Alkaline Phosphatase: 56 U/L (ref 38–126)
Anion gap: 4 — ABNORMAL LOW (ref 5–15)
BUN: 9 mg/dL (ref 6–20)
CO2: 27 mmol/L (ref 22–32)
Calcium: 8.1 mg/dL — ABNORMAL LOW (ref 8.9–10.3)
Chloride: 109 mmol/L (ref 98–111)
Creatinine, Ser: 0.48 mg/dL — ABNORMAL LOW (ref 0.61–1.24)
GFR calc Af Amer: >60 mL/min (ref >60)
GFR calc non Af Amer: >60 mL/min (ref >60)
Glucose, Bld: 81 mg/dL (ref 70–99)
Potassium: 3.8 mmol/L (ref 3.5–5.1)
Sodium: 140 mmol/L (ref 135–145)
Total Bilirubin: 0.8 mg/dL (ref 0.3–1.2)
Total Protein: 4.9 g/dL — ABNORMAL LOW (ref 6.5–8.1)

## 2018-06-11 LAB — CBC
HCT: 30.4 % — ABNORMAL LOW (ref 39.0–52.0)
Hemoglobin: 10.2 g/dL — ABNORMAL LOW (ref 13.0–17.0)
MCH: 31.1 pg (ref 26.0–34.0)
MCHC: 33.6 g/dL (ref 30.0–36.0)
MCV: 92.7 fL (ref 80.0–100.0)
PLATELETS: 159 10*3/uL (ref 150–400)
RBC: 3.28 MIL/uL — ABNORMAL LOW (ref 4.22–5.81)
RDW: 13.6 % (ref 11.5–15.5)
WBC: 4.7 10*3/uL (ref 4.0–10.5)
nRBC: 0 % (ref 0.0–0.2)

## 2018-06-11 LAB — PHOSPHORUS: PHOSPHORUS: 1.2 mg/dL — AB (ref 2.5–4.6)

## 2018-06-11 LAB — VITAMIN D 25 HYDROXY (VIT D DEFICIENCY, FRACTURES): VIT D 25 HYDROXY: 14.1 ng/mL — AB (ref 30.0–100.0)

## 2018-06-11 LAB — GLUCOSE, CAPILLARY: Glucose-Capillary: 70 mg/dL (ref 70–99)

## 2018-06-11 LAB — MAGNESIUM: Magnesium: 2.3 mg/dL (ref 1.7–2.4)

## 2018-06-11 MED ORDER — INSULIN GLARGINE 100 UNIT/ML SOLOSTAR PEN: 10.0000 [IU] | PEN_INJECTOR | Freq: Two times a day (BID) | SUBCUTANEOUS | 0 refills | Status: DC

## 2018-06-11 MED ORDER — BLOOD GLUCOSE MONITOR KIT: PACK | 0 refills | Status: DC

## 2018-06-11 MED ORDER — ADULT MULTIVITAMIN W/MINERALS CH: 1.0000 | ORAL_TABLET | Freq: Every day | ORAL | 0 refills | Status: AC

## 2018-06-11 MED ORDER — SODIUM PHOSPHATES 45 MMOLE/15ML IV SOLN
30.0000 mmol | Freq: Once | INTRAVENOUS | Status: DC
  Filled 2018-06-11: qty 10

## 2018-06-11 MED ORDER — VITAMIN D 25 MCG (1000 UNIT) PO TABS: 1000.0000 [IU] | ORAL_TABLET | Freq: Every day | ORAL | 0 refills | Status: AC

## 2018-06-11 MED ORDER — SODIUM PHOSPHATES 45 MMOLE/15ML IV SOLN: 20.0000 mmol | Freq: Once | INTRAVENOUS | Status: DC

## 2018-06-11 MED ORDER — POTASSIUM & SODIUM PHOSPHATES 280-160-250 MG PO PACK: 2.0000 | PACK | Freq: Three times a day (TID) | ORAL | 0 refills | Status: AC

## 2018-06-11 MED ORDER — POTASSIUM & SODIUM PHOSPHATES 280-160-250 MG PO PACK
2.0000 | PACK | Freq: Three times a day (TID) | ORAL | Status: DC
  Administered 2018-06-11: 2 via ORAL
  Filled 2018-06-11 (×3): qty 2

## 2018-06-11 NOTE — Discharge Instructions (Signed)
Diabetes Mellitus and Nutrition, Adult °When you have diabetes (diabetes mellitus), it is very important to have healthy eating habits because your blood sugar (glucose) levels are greatly affected by what you eat and drink. Eating healthy foods in the appropriate amounts, at about the same times every day, can help you: °· Control your blood glucose. °· Lower your risk of heart disease. °· Improve your blood pressure. °· Reach or maintain [name] [name] healthy weight. °Every person with diabetes is different, and each person has different needs for [name] [name] meal plan. Your health care provider may recommend that you work with [name] [name] diet and nutrition specialist (dietitian) to make [name] [name] meal plan that is best for you. Your meal plan may vary depending on factors such as: °· The calories you need. °· The medicines you take. °· Your weight. °· Your blood glucose, blood pressure, and cholesterol levels. °· Your activity level. °· Other health conditions you have, such as heart or kidney disease. °How do carbohydrates affect me? °Carbohydrates, also called carbs, affect your blood glucose level more than any other type of food. Eating carbs naturally raises the amount of glucose in your blood. Carb counting is [name] [name] method for keeping track of how many carbs you eat. Counting carbs is important to keep your blood glucose at [name] [name] healthy level, especially if you use insulin or take certain oral diabetes medicines. °It is important to know how many carbs you can safely have in each meal. This is different for every person. Your dietitian can help you calculate how many carbs you should have at each meal and for each snack. °Foods that contain carbs include: °· Bread, cereal, rice, pasta, and crackers. °· Potatoes and corn. °· Peas, beans, and lentils. °· Milk and yogurt. °· Fruit and juice. °· Desserts, such as cakes, cookies, ice cream, and candy. °How does alcohol affect me? °Alcohol can cause [name] [name] sudden decrease in blood glucose (hypoglycemia),  especially if you use insulin or take certain oral diabetes medicines. Hypoglycemia can be [name] [name] life-threatening condition. Symptoms of hypoglycemia (sleepiness, dizziness, and confusion) are similar to symptoms of having too much alcohol. °If your health care provider says that alcohol is safe for you, follow these guidelines: °· Limit alcohol intake to no more than 1 drink per day for nonpregnant women and 2 drinks per day for men. One drink equals 12 oz of beer, 5 oz of wine, or 1½ oz of hard liquor. °· Do not drink on an empty stomach. °· Keep yourself hydrated with water, diet soda, or unsweetened iced tea. °· Keep in mind that regular soda, juice, and other mixers may contain [name] [name] lot of sugar and must be counted as carbs. °What are tips for following this plan? ° °Reading food labels °· Start by checking the serving size on the "Nutrition Facts" label of packaged foods and drinks. The amount of calories, carbs, fats, and other nutrients listed on the label is based on one serving of the item. Many items contain more than one serving per package. °· Check the total grams (g) of carbs in one serving. You can calculate the number of servings of carbs in one serving by dividing the total carbs by 15. For example, if [name] [name] food has 30 g of total carbs, it would be equal to 2 servings of carbs. °· Check the number of grams (g) of saturated and trans fats in one serving. Choose foods that have low or no amount of these fats. °· Check the number of   milligrams (mg) of salt (sodium) in one serving. Most people should limit total sodium intake to less than 2,300 mg per day. °· Always check the nutrition information of foods labeled as "low-fat" or "nonfat". These foods may be higher in added sugar or refined carbs and should be avoided. °· Talk to your dietitian to identify your daily goals for nutrients listed on the label. °Shopping °· Avoid buying canned, premade, or processed foods. These foods tend to be high in fat, sodium,  and added sugar. °· Shop around the outside edge of the grocery store. This includes fresh fruits and vegetables, bulk grains, fresh meats, and fresh dairy. °Cooking °· Use low-heat cooking methods, such as baking, instead of high-heat cooking methods like deep frying. °· Cook using healthy oils, such as olive, canola, or sunflower oil. °· Avoid cooking with butter, cream, or high-fat meats. °Meal planning °· Eat meals and snacks regularly, preferably at the same times every day. Avoid going long periods of time without eating. °· Eat foods high in fiber, such as fresh fruits, vegetables, beans, and whole grains. Talk to your dietitian about how many servings of carbs you can eat at each meal. °· Eat 4-6 ounces (oz) of lean protein each day, such as lean meat, chicken, fish, eggs, or tofu. One oz of lean protein is equal to: °? 1 oz of meat, chicken, or fish. °? 1 egg. °? ¼ cup of tofu. °· Eat some foods each day that contain healthy fats, such as avocado, nuts, seeds, and fish. °Lifestyle °· Check your blood glucose regularly. °· Exercise regularly as told by your health care provider. This may include: °? 150 minutes of moderate-intensity or vigorous-intensity exercise each week. This could be brisk walking, biking, or water aerobics. °? Stretching and doing strength exercises, such as yoga or weightlifting, at least 2 times [name] [name] week. °· Take medicines as told by your health care provider. °· Do not use any products that contain nicotine or tobacco, such as cigarettes and e-cigarettes. If you need help quitting, ask your health care provider. °· Work with [name] [name] counselor or diabetes educator to identify strategies to manage stress and any emotional and social challenges. °Questions to ask [name] [name] health care provider °· Do I need to meet with [name] [name] diabetes educator? °· Do I need to meet with [name] [name] dietitian? °· What number can I call if I have questions? °· When are the best times to check my blood glucose? °Where to find more  information: °· American Diabetes Association: diabetes.org °· Academy of Nutrition and Dietetics: www.eatright.org °· National Institute of Diabetes and Digestive and Kidney Diseases (NIH): www.niddk.nih.gov °Summary °· Sumner Boesch healthy meal plan will help you control your blood glucose and maintain Mitchael Luckey healthy lifestyle. °· Working with Claudett Bayly diet and nutrition specialist (dietitian) can help you make Mckaylee Dimalanta meal plan that is best for you. °· Keep in mind that carbohydrates (carbs) and alcohol have immediate effects on your blood glucose levels. It is important to count carbs and to use alcohol carefully. °This information is not intended to replace advice given to you by your health care provider. Make sure you discuss any questions you have with your health care provider. °Document Released: 02/02/2005 Document Revised: 12/06/2016 Document Reviewed: 06/12/2016 °Elsevier Interactive Patient Education © 2019 Else[employer] Inc[employer] ° ° °Blood Glucose Monitoring, Adult °Monitoring your blood sugar (glucose) is an important part of managing your diabetes (diabetes mellitus). Blood glucose monitoring involves checking your blood glucose as often as directed and keeping   Brigit Doke record (log) of your results over time. °Checking your blood glucose regularly and keeping Bosco Paparella blood glucose log can: °· Help you and your health care provider adjust your diabetes management plan as needed, including your medicines or insulin. °· Help you understand how food, exercise, illnesses, and medicines affect your blood glucose. °· Let you know what your blood glucose is at any time. You can quickly find out if you have low blood glucose (hypoglycemia) or high blood glucose (hyperglycemia). °Your health care provider will set individualized treatment goals for you. Your goals will be based on your age, other medical conditions you have, and how you respond to diabetes treatment. Generally, the goal of treatment is to maintain the following blood glucose levels: °· Before  meals (preprandial): 80-130 mg/dL (4.4-7.2 mmol/L). °· After meals (postprandial): below 180 mg/dL (10 mmol/L). °· A1c level: less than 7%. °Supplies needed: °· Blood glucose meter. °· Test strips for your meter. Each meter has its own strips. You must use the strips that came with your meter. °· Moishe Schellenberg needle to prick your finger (lancet). Do not use Kataleyah Carducci lancet more than one time. °· Jasmon Mattice device that holds the lancet (lancing device). °· Bri Wakeman journal or log book to write down your results. °How to check your blood glucose ° °1. Wash your hands with soap and water. °2. Prick the side of your finger (not the tip) with the lancet. Use Richmond Coldren different finger each time. °3. Gently rub the finger until Issabela Lesko small drop of blood appears. °4. Follow instructions that come with your meter for inserting the test strip, applying blood to the strip, and using your blood glucose meter. °5. Write down your result and any notes. °Some meters allow you to use areas of your body other than your finger (alternative sites) to test your blood. The most common alternative sites are: °· Forearm. °· Thigh. °· Palm of the hand. °If you think you may have hypoglycemia, or if you have Wavie Hashimi history of not knowing when your blood glucose is getting low (hypoglycemia unawareness), do not use alternative sites. Use your finger instead. Alternative sites may not be as accurate as the fingers, because blood flow is slower in these areas. This means that the result you get may be delayed, and it may be different from the result that you would get from your finger. °Follow these instructions at home: °Blood glucose log ° °· Every time you check your blood glucose, write down your result. Also write down any notes about things that may be affecting your blood glucose, such as your diet and exercise for the day. This information can help you and your health care provider: °? Look for patterns in your blood glucose over time. °? Adjust your diabetes management plan as  needed. °· Check if your meter allows you to download your records to Meeyah Ovitt computer. Most glucose meters store Britni Driscoll record of glucose readings in the meter. °If you have type 1 diabetes: °· Check your blood glucose 2 or more times Kalel Harty day. °· Also check your blood glucose: °? Before every insulin injection. °? Before and after exercise. °? Before meals. °? 2 hours after Vikrant Pryce meal. °? Occasionally between 2:00 Ranelle Auker.m. and 3:00 Cruze Zingaro.m., as directed. °? Before potentially dangerous tasks, like driving or using heavy machinery. °? At bedtime. °· You may need to check your blood glucose more often, up to 6-10 times Nancye Grumbine day, if you: °? Use an insulin pump. °? Need multiple daily injections (  MDI). °? Have diabetes that is not well-controlled. °? Are ill. °? Have Dadrian Ballantine history of severe hypoglycemia. °? Have hypoglycemia unawareness. °If you have type 2 diabetes: °· If you take insulin or other diabetes medicines, check your blood glucose 2 or more times Jacolyn Joaquin day. °· If you are on intensive insulin therapy, check your blood glucose 4 or more times Jadynn Epping day. Occasionally, you may also need to check between 2:00 Colen Eltzroth.m. and 3:00 Keniah Klemmer.m., as directed. °· Also check your blood glucose: °? Before and after exercise. °? Before potentially dangerous tasks, like driving or using heavy machinery. °· You may need to check your blood glucose more often if: °? Your medicine is being adjusted. °? Your diabetes is not well-controlled. °? You are ill. °General tips °· Always keep your supplies with you. °· If you have questions or need help, all blood glucose meters have Guthrie Lemme 24-hour "hotline" phone number that you can call. You may also contact your health care provider. °· After you use Dacen Frayre few boxes of test strips, adjust (calibrate) your blood glucose meter by following instructions that came with your meter. °Contact Powell Halbert health care provider if: °· Your blood glucose is at or above 240 mg/dL (13.3 mmol/L) for 2 days in Moataz Tavis row. °· You have been sick or have had Temeca Somma fever for 2  days or longer, and you are not getting better. °· You have any of the following problems for more than 6 hours: °? You cannot eat or drink. °? You have nausea or vomiting. °? You have diarrhea. °Get help right away if: °· Your blood glucose is lower than 54 mg/dL (3 mmol/L). °· You become confused or you have trouble thinking clearly. °· You have difficulty breathing. °· You have moderate or large ketone levels in your urine. °Summary °· Monitoring your blood sugar (glucose) is an important part of managing your diabetes (diabetes mellitus). °· Blood glucose monitoring involves checking your blood glucose as often as directed and keeping Clerence Gubser record (log) of your results over time. °· Your health care provider will set individualized treatment goals for you. Your goals will be based on your age, other medical conditions you have, and how you respond to diabetes treatment. °· Every time you check your blood glucose, write down your result. Also write down any notes about things that may be affecting your blood glucose, such as your diet and exercise for the day. °This information is not intended to replace advice given to you by your health care provider. Make sure you discuss any questions you have with your health care provider. °Document Released: 05/11/2003 Document Revised: 03/19/2017 Document Reviewed: 10/18/2015 °Elsevier Interactive Patient Education © 2019 [employer] Inc. ° °

## 2018-06-11 NOTE — Discharge Summary (Signed)
Physician Discharge Summary  [name] [name] OEV:[id number] DOB: [birth year] DOA: 06/09/2018  PCP: [name] Rakes, MD  Admit date: 06/09/2018 Discharge date: 06/11/2018  Time spent: 40 minutes  Recommendations for Outpatient Follow-up:  1. Follow up outpatient CBC/CMP 2. Follow up blood sugars on 10 lantus BID with 3 units TID with meals of novolog 3. Ensure PCP follow up 4. Follow up calcium and vitamin D 5. Follow up phosphorus as outpatient   6. Elevated AST, isolated, follow outpatient (discussed with pt)  Discharge Diagnoses:  Principal Problem:   Diabetic ketoacidosis without coma associated with type 1 diabetes mellitus ([place]) Active Problems:   Dehydration   DM (diabetes mellitus), type 1, uncontrolled ([place])   Sinus tachycardia   DKA (diabetic ketoacidoses) ([place])   Discharge Condition: stable  Diet recommendation: diabetic  Filed Weights   06/09/18 1206  Weight: 54.4 kg    History of present illness:  [name] [name] [name] [name] 33 y.o.malewith past medical history of recurrent DKA's, type 1 diabetes mellitus presented to the hospital with weakness fatigue and malaise for the last 2 to 3 days. States that he has been compliant with his insulin regimen at home and has not missed it. Denies fever, chills or rigors. Denies any shortness of breath,cough,runny nose or sore throat. Denies any chest pain. Patient denies any urinary urgency, frequency or dysuria. Denies any abdominal pain. Slightly nauseated today. Patient denies any recent travel or sick contacts. Denies dyspnea cough or sputum production.  He was admitted for DKA.  He improved on insulin gtt and was transitioned to his home regimen.  Discharged with lantus 10 units BID with novolog TID with meals.  Hospital Course:  Diabetic ketoacidosis without coma with type 1 diabetes mellitus A1c 12.1 05/25/2018 He notes compliance with his home regimen of lantus 15 units BID and novolog 8 units TID BG this AM 70, will  decrease to 10 units BID with novolog 3 units TID (reviewed diabetic coordinator note, but will use higher dose of insulin as pt noted he was taking 15 units BID after his discharge and he's done relatively well on 15 units BID)  Anxiety  Adjustment Disorder: continue to monitor Hypokalemia: replace IV and PO, magnesium wnl Hypocalcemia: replace IV and PO, mag wnl, check PTH (non pth mediated hypocalcemia) and vitamin D (low), start supplementation Hypophosphatemia: replace and follow NAGMA: switch to LR, continue to monitor Elevated blood pressure on presentation: improved, follow Leukocytosis: resolved  Hyponatremia: improved  Volume depletion: improved, continue IVF  Lactic acidosis: resolved Acute kidney injury: resolved   Procedures:  none   Consultations:  none  Discharge Exam: Vitals:   06/11/18 0320 06/11/18 0351  BP: 130/83   Pulse:    Resp: 12   Temp:  98.1 F (36.7 C)  SpO2: 99%    Feeling ebtter. Ready to go Mom at bedside Counseled regarding follow up at discharge, importance of compliance  General: No acute distress. Cardiovascular: Heart sounds show [name] [name] regular rate, and rhythm Lungs: Clear to auscultation bilaterally  Abdomen: Soft, nontender, nondistended Neurological: Alert and oriented 3. Moves all extremities 4. Cranial nerves II through XII grossly intact. Skin: Warm and dry. No rashes or lesions. Extremities: No clubbing or cyanosis. No edema.  Psychiatric: Mood and affect are normal. Insight and judgment are appropriate.   Discharge Instructions   Discharge Instructions    Call MD for:  difficulty breathing, headache or visual disturbances   Complete by:  As directed    Call MD for:  extreme fatigue  Complete by:  As directed    Call MD for:  hives   Complete by:  As directed    Call MD for:  persistant dizziness or light-headedness   Complete by:  As directed    Call MD for:  persistant nausea and vomiting   Complete by:  As  directed    Call MD for:  redness, tenderness, or signs of infection (pain, swelling, redness, odor or green/yellow discharge around incision site)   Complete by:  As directed    Call MD for:  severe uncontrolled pain   Complete by:  As directed    Call MD for:  temperature >100.4   Complete by:  As directed    Diet - low sodium heart healthy   Complete by:  As directed    Discharge instructions   Complete by:  As directed    You were seen for diabetic ketoacidosis.  Your blood sugars have improved.  Continue lantus 10 units twice daily and novolog 3 units with meals.  Please call your PCP today to schedule an appointment with your PCP within the next few days.  Keep [name] [name] log of your blood sugars.  Check them 3 times [name] [name] day before meals and at bedtime.  Bring this log with you to your next appointment.  Your phosphorus was low.  Please follow up [name] [name] repeat test with your outpatient doctor.  Your vitamin D was low.  Supplement this with 1000 IU vitamin D daily.  Please follow up with your PCP.  Your calcium was low, but has improved (follow this up as an outpatient as well).  Return for new, recurrent, or worsening symptoms.  Please ask your PCP to request records from this hospitalization so they know what was done and what the next steps will be.   Increase activity slowly   Complete by:  As directed      Allergies as of 06/11/2018   No Known Allergies     Medication List    TAKE these medications   blood glucose meter kit and supplies Kit Dispense based on patient and insurance preference. Use up to four times daily as directed. (FOR ICD-9 250.00, 250.01).   cholecalciferol 25 MCG (1000 UT) tablet Commonly known as:  VITAMIN D3 Take 1 tablet (1,000 Units total) by mouth daily for 30 days.   glucose blood test strip Commonly known as:  TRUE METRIX BLOOD GLUCOSE TEST Use as instructed three times daily   insulin aspart 100 UNIT/ML injection Commonly known as:  novoLOG Inject  3 Units into the skin 3 (three) times daily before meals.   Insulin Glargine 100 UNIT/ML Solostar Pen Commonly known as:  LANTUS Inject 10 Units into the skin 2 (two) times daily for 30 days. What changed:    how much to take  when to take this   multivitamin with minerals Tabs tablet Take 1 tablet by mouth daily for 30 days.   potassium & sodium phosphates 280-160-250 MG Pack Commonly known as:  PHOS-NAK Take 2 packets by mouth 4 (four) times daily -  before meals and at bedtime for 1 day.   TRUE METRIX METER w/Device Kit 1 each by Other route 3 (three) times daily. Use as directed three times daily   TRUEPLUS LANCETS 28G Misc Use as directed three times daily      No Known Allergies Follow-up Information    [name] Rakes, MD Follow up.   Specialty:  Family Medicine Why:  Call for appointment  Contact information: [place] [address] [phone]            The results of significant diagnostics from this hospitalization (including imaging, microbiology, ancillary and laboratory) are listed below for reference.    Significant Diagnostic Studies: Dg Chest Port 1 View  Result Date: 06/09/2018 CLINICAL DATA:  Weakness, DKA EXAM: PORTABLE CHEST 1 VIEW COMPARISON:  05/25/2018 FINDINGS: The heart size and mediastinal contours are within normal limits. Both lungs are clear. The visualized skeletal structures are unremarkable. IMPRESSION: No active disease. Electronically Signed   By: Franchot Gallo M.D.   On: 06/09/2018 13:23   Dg Chest Portable 1 View  Result Date: 05/25/2018 CLINICAL DATA:  Tachycardia and hyperglycemia. EXAM: PORTABLE CHEST 1 VIEW COMPARISON:  03/09/2018 and 01/28/2017 FINDINGS: Lungs are adequately inflated and otherwise clear. Cardiomediastinal silhouette, bones and soft tissues are normal. IMPRESSION: No active disease. Electronically Signed   By: Marin Olp M.D.   On: 05/25/2018 16:19    Microbiology: Recent Results  (from the past 240 hour(s))  Blood culture (routine x 2)     Status: None (Preliminary result)   Collection Time: 06/09/18  1:06 PM  Result Value Ref Range Status   Specimen Description   Final    BLOOD LEFT FOREARM Blood Culture adequate volume BOTTLES DRAWN AEROBIC AND ANAEROBIC Performed at [facility] [address], [place], [place] [address]    Special Requests   Final    NONE Performed at [facility], [place] [address], [place], [place] [address]    Culture   Final    NO GROWTH < 24 HOURS Performed at [facility] [address] [place], [place] [address]    Report Status PENDING  Incomplete  Blood culture (routine x 2)     Status: None (Preliminary result)   Collection Time: 06/09/18  1:11 PM  Result Value Ref Range Status   Specimen Description   Final    BLOOD LEFT ANTECUBITAL Blood Culture adequate volume BOTTLES DRAWN AEROBIC AND ANAEROBIC Performed at [facility] [address] [place], [place] [address]    Special Requests   Final    NONE Performed at [facility], [place] [address], [place], [place] [address]    Culture   Final    NO GROWTH < 24 HOURS Performed at [facility] [address], [place], [place] [address]    Report Status PENDING  Incomplete     Labs: Basic Metabolic Panel: Recent Labs  Lab 06/09/18 2308 06/10/18 0510 06/10/18 0815 06/10/18 1353 06/11/18 0318  NA 134* 132* 132* 133* 140  K 3.7 4.0 2.8* 2.9* 3.8  CL 108 105 105 105 109  CO2 16* 15* 18* 20* 27  GLUCOSE 113* 188* 156* 164* 81  BUN _0 CREATININE 0.73 0.76 0.53* 0.68 0.48*  CALCIUM 6.6* 6.4* 6.3* 6.8*  6.7 8.1*  MG 1.6*  --  2.3  --  2.3  PHOS 1.4*  --   --   --  1.2*   Liver Function Tests: Recent Labs  Lab 06/10/18 0739 06/10/18 1353 06/11/18 0318  AST  --   --  95*  ALT  --   --  41  ALKPHOS  --   --  56  BILITOT  --   --  0.8  PROT  --   --  4.9*  ALBUMIN  3.0* 3.3* 2.8*  No results for input(s): LIPASE, AMYLASE in the last 168 hours. No results for input(s): AMMONIA in the last 168 hours. CBC: Recent Labs  Lab 06/09/18 1219 06/09/18 1549 06/10/18 0739 06/11/18 0318  WBC 14.4*  --  10.2 4.7  HGB 14.3 14.6 10.2* 10.2*  HCT 46.3 43.0 30.0* 30.4*  MCV 101.1*  --  92.6 92.7  PLT 372  --  196 159   Cardiac Enzymes: No results for input(s): CKTOTAL, CKMB, CKMBINDEX, TROPONINI in the last 168 hours. BNP: BNP (last 3 results) No results for input(s): BNP in the last 8760 hours.  ProBNP (last 3 results) No results for input(s): PROBNP in the last 8760 hours.  CBG: Recent Labs  Lab 06/10/18 1306 06/10/18 1352 06/10/18 1625 06/10/18 2123 06/11/18 0746  GLUCAP 220* 157* 196* 133* 70       Signed:  Fayrene Helper MD.  [facility] 06/11/2018, 8:39 AM

## 2018-06-11 NOTE — Progress Notes (Signed)
CRITICAL VALUE ALERT  Critical Value:  Parathyroid total calcium (pth) 6.7  Date & Time Notied: 03:50a  Provider Notified: NP Craige Cotta  Orders Received/Actions taken: Awaiting orders

## 2018-06-12 ENCOUNTER — Telehealth: Payer: Self-pay

## 2018-06-12 NOTE — Telephone Encounter (Signed)
Transition Care Management Follow-up Telephone Call  Date of discharge and from where: 06/11/2018  [facility]   How have you been since you were released from the hospital? He stated that he is " feeling alright."   Any questions or concerns? No questions/concerns reported at this time  He explained that he is currently not working due to his poor attendance. He said that he understands that he needs to get his DM under control first before he can work.  He said that he was instructed to schedule an appointment with his PCP and he was very willing to schedule an appointment when this CM called.  Items Reviewed:  Did the pt receive and understand the discharge instructions provided? He said that he has the discharge instructions and does not have any questions.  He said that the instructions  were reviewed with him prior to discharge.   Medications obtained and verified? He said that he has his insulin and he repeated the correct dosing and said that he is taking as ordered.   He explained that he has not yet picked up his new medications - cholecalciferol, multivitamins with minerals and potassium & sodium phosphates.  He needs to call the pharmacy to make sure that they are ready for pick up.  Informed him that a new glucometer, testing supplies and lancets have been ordered and are also at [facility]. He did not want to review the medication list.   Any new allergies since your discharge? None reported  Do you have support at home? Yes, his mother and his brother  Other (ie: DME, Home Health, etc) needs to pick up new glucometer. Has been using a friend's g;ucometer. He reported that his fasting glucose this morning was 161.   Functional Questionnaire: (I = Independent and D = Dependent) ADL's: independent   Follow up appointments reviewed:    PCP Hospital f/u appt confirmed? Appointment was scheduled for 06/17/2018 @ 1050 with Dr [facility] f/u appt  confirmed?none scheduled at this time.  Are transportation arrangements needed?he said that he would have a ride.  Transportation is not a problem.  If their condition worsens, is the pt aware to call  their PCP or go to the ED?   yes  Was the patient provided with contact information for the PCP's office or ED? He has the phone number  Was the pt encouraged to call back with questions or concerns? yes

## 2018-06-13 MED FILL — !NOVOLOG FLEXPEN SYRINGE 1: 100/ML | 33 days supply | Qty: 3 | Fill #0

## 2018-06-13 MED FILL — TRUEplus LANCETS 28G MISC: 30 days supply | Qty: 100 | Fill #0

## 2018-06-13 MED FILL — !TRUE METRIX BLOOD GLUCOSE: 365 days supply | Qty: 1 | Fill #0

## 2018-06-13 MED FILL — TRUE METRIX TEST STRIP: 30 days supply | Qty: 100 | Fill #0

## 2018-06-14 LAB — CULTURE, BLOOD (ROUTINE X 2)
Culture: NO GROWTH
Culture: NO GROWTH

## 2018-06-17 ENCOUNTER — Ambulatory Visit: Payer: No Typology Code available for payment source | Attending: Family Medicine | Admitting: Family Medicine

## 2018-06-17 ENCOUNTER — Telehealth: Payer: Self-pay

## 2018-06-17 ENCOUNTER — Encounter: Payer: Self-pay | Admitting: Family Medicine

## 2018-06-17 VITALS — BP 136/88 | HR 86 | Temp 97.9°F | Ht 66.0 in | Wt 123.4 lb

## 2018-06-17 DIAGNOSIS — E1069 Type 1 diabetes mellitus with other specified complication: Secondary | ICD-10-CM

## 2018-06-17 DIAGNOSIS — E1065 Type 1 diabetes mellitus with hyperglycemia: Secondary | ICD-10-CM

## 2018-06-17 DIAGNOSIS — E876 Hypokalemia: Secondary | ICD-10-CM

## 2018-06-17 LAB — GLUCOSE, POCT (MANUAL RESULT ENTRY): POC Glucose: 222 mg/dl — AB (ref 70–99)

## 2018-06-17 MED ORDER — INSULIN ASPART 100 UNIT/ML ~~LOC~~ SOLN: SUBCUTANEOUS | 3 refills | Status: DC

## 2018-06-17 MED ORDER — INSULIN GLARGINE 100 UNIT/ML SOLOSTAR PEN: 15.0000 [IU] | PEN_INJECTOR | Freq: Two times a day (BID) | SUBCUTANEOUS | 6 refills | Status: DC

## 2018-06-17 NOTE — Progress Notes (Signed)
[facility]  Date of Telephone Encounter: 06/12/2018  Date of 1st service: 06/17/2018   Admit Date: 06/09/2018 Discharge Date: 06/11/2018  Subjective:  Patient ID: [name] [name], male    DOB: [birth year]  Age: 33 y.o. MRN: [id number]  CC: Diabetes   HPI Mehki Klumpp is a 33 year old male with a history of type 1 diabetes mellitus (A1c 12.1) who presents today for follow-up visit at the transitional care clinic after hospitalization for DKA at [facility]. 2 weeks prior to this he was hospitalized for DKA at [facility] and discharged on 05/27/2018.  At his most recent hospitalization, he had presented with fatigue, malaise, weakness, found to be in lactic acid since with acute kidney injury and was commenced on IV fluids and insulin.  Found to have hypocalcemia, hypokalemia, hyponatremia, hypophosphatemia which were all replaced. Also found to have vitamin D deficiency with a vitamin D level of 14.1 for which she was commenced on replacement. His condition improved and he was subsequently discharged.  He presents today reporting feeling well and his blood sugars at night have been around 147.  He endorses compliance with 15 units of Lantus twice daily and administers 8 units of NovoLog if he eats greater than 50% of his meal. He denies additional concerns at this time.  Past Medical History:  Diagnosis Date  . ARF (acute renal failure) ([place]) 2011   pt was on dialysis "for 1 day approx 2011, pt is no longer on dialysis (03/09/2017)  . DKA (diabetic ketoacidoses) ([place])    "several times" (03/09/2017)  . Type I diabetes mellitus ([place]) Dx 2012    Past Surgical History:  Procedure Laterality Date  . NO PAST SURGERIES      No Known Allergies   Outpatient Medications Prior to Visit  Medication Sig Dispense Refill  . cholecalciferol (VITAMIN D3) 25 MCG (1000 UT) tablet Take 1 tablet (1,000 Units total) by mouth daily for 30 days. 30 tablet 0  . glucose  blood (TRUE METRIX BLOOD GLUCOSE TEST) test strip Use as instructed three times daily 100 each 1  . Multiple Vitamin (MULTIVITAMIN WITH MINERALS) TABS tablet Take 1 tablet by mouth daily for 30 days. 30 tablet 0  . TRUEPLUS LANCETS 28G MISC Use as directed three times daily 100 each 1  . insulin aspart (NOVOLOG) 100 UNIT/ML injection Inject 3 Units into the skin 3 (three) times daily before meals. 3 mL 0  . Insulin Glargine (LANTUS) 100 UNIT/ML Solostar Pen Inject 10 Units into the skin 2 (two) times daily for 30 days. 6 mL 0  . blood glucose meter kit and supplies KIT Dispense based on patient and insurance preference. Use up to four times daily as directed. (FOR ICD-9 250.00, 250.01). (Patient not taking: Reported on 06/17/2018) 1 each 0  . Blood Glucose Monitoring Suppl (TRUE METRIX METER) w/Device KIT 1 each by Other route 3 (three) times daily. Use as directed three times daily (Patient not taking: Reported on 06/17/2018) 1 kit 0   No facility-administered medications prior to visit.     ROS Review of Systems  Constitutional: Negative for activity change and appetite change.  HENT: Negative for sinus pressure and sore throat.   Eyes: Negative for visual disturbance.  Respiratory: Negative for cough, chest tightness and shortness of breath.   Cardiovascular: Negative for chest pain and leg swelling.  Gastrointestinal: Negative for abdominal distention, abdominal pain, constipation and diarrhea.  Endocrine: Negative.   Genitourinary: Negative for dysuria.  Musculoskeletal:  Negative for joint swelling and myalgias.  Skin: Negative for rash.  Allergic/Immunologic: Negative.   Neurological: Negative for weakness, light-headedness and numbness.  Psychiatric/Behavioral: Negative for dysphoric mood and suicidal ideas.    Objective:  BP 136/88   Pulse 86   Temp 97.9 F (36.6 C) (Oral)   Ht '5\' 6"'$  (1.676 m)   Wt 123 lb 6.4 oz (56 kg)   SpO2 100%   BMI 19.92 kg/m   BP/Weight 06/17/2018  06/11/2018 7/51/0258  Systolic BP 527 782 -  Diastolic BP 88 90 -  Wt. (Lbs) 123.4 - 120  BMI 19.92 - 19.37      Physical Exam Constitutional:      Appearance: He is well-developed.  Cardiovascular:     Rate and Rhythm: Normal rate.     Heart sounds: Normal heart sounds. No murmur.  Pulmonary:     Effort: Pulmonary effort is normal.     Breath sounds: Normal breath sounds. No wheezing or rales.  Chest:     Chest wall: No tenderness.  Abdominal:     General: Bowel sounds are normal. There is no distension.     Palpations: Abdomen is soft. There is no mass.     Tenderness: There is no abdominal tenderness.  Musculoskeletal: Normal range of motion.  Neurological:     Mental Status: He is alert and oriented to person, place, and time.  Psychiatric:        Mood and Affect: Mood normal.        Behavior: Behavior normal.      CMP Latest Ref Rng & Units 06/11/2018 06/10/2018 06/10/2018  Glucose 70 - 99 mg/dL 81 164(H) 156(H)  BUN 6 - 20 mg/dL '9 9 12  '$ Creatinine 0.61 - 1.24 mg/dL 0.48(L) 0.68 0.53(L)  Sodium 135 - 145 mmol/L 140 133(L) 132(L)  Potassium 3.5 - 5.1 mmol/L 3.8 2.9(L) 2.8(L)  Chloride 98 - 111 mmol/L 109 105 105  CO2 22 - 32 mmol/L 27 20(L) 18(L)  Calcium 8.9 - 10.3 mg/dL 8.1(L) 6.8(L) 6.7  Total Protein 6.5 - 8.1 g/dL 4.9(L) - -  Total Bilirubin 0.3 - 1.2 mg/dL 0.8 - -  Alkaline Phos 38 - 126 U/L 56 - -  AST 15 - 41 U/L 95(H) - -  ALT 0 - 44 U/L 41 - -    CBC    Component Value Date/Time   WBC 4.7 06/11/2018 0318   RBC 3.28 (L) 06/11/2018 0318   HGB 10.2 (L) 06/11/2018 0318   HCT 30.4 (L) 06/11/2018 0318   PLT 159 06/11/2018 0318   MCV 92.7 06/11/2018 0318   MCH 31.1 06/11/2018 0318   MCHC 33.6 06/11/2018 0318   RDW 13.6 06/11/2018 0318   LYMPHSABS 1.9 04/17/2018 1002   MONOABS 0.4 04/17/2018 1002   EOSABS 0.0 04/17/2018 1002   BASOSABS 0.0 04/17/2018 1002    Lab Results  Component Value Date   HGBA1C 12.1 (H) 05/25/2018     Assessment &  Plan:   1. Uncontrolled type 1 diabetes mellitus with hyperglycemia (HCC) Uncontrolled with A1c of 12.1 Improvement in blood sugars lately No regimen change at this time due to improvements Counseled on Diabetic diet, my plate method, 423 minutes of moderate intensity exercise/week Keep blood sugar logs with fasting goals of 80-120 mg/dl, random of less than 180 and in the event of sugars less than 60 mg/dl or greater than 400 mg/dl please notify the clinic ASAP. It is recommended that you undergo annual eye exams and  annual foot exams. Pneumonia vaccine is recommended. - POCT glucose (manual entry)  2. Type 1 diabetes mellitus with other specified complication ([place]) See #1 above - Insulin Glargine (LANTUS) 100 UNIT/ML Solostar Pen; Inject 15 Units into the skin 2 (two) times daily for 30 days.  Dispense: 30 mL; Refill: 6 - insulin aspart (NOVOLOG) 100 UNIT/ML injection; 3- 8 units subcutaneously before breakfast, lunch and dinner as per sliding scale  Dispense: 30 mL; Refill: 3  3. Hypokalemia We will check potassium again and replaced if needed - Basic Metabolic Panel  4. Hypophosphatemia Likely due to critical illness during hospitalization We will repeat at next visit   Meds ordered this encounter  Medications  . Insulin Glargine (LANTUS) 100 UNIT/ML Solostar Pen    Sig: Inject 15 Units into the skin 2 (two) times daily for 30 days.    Dispense:  30 mL    Refill:  6    Discontinue previous dose  . insulin aspart (NOVOLOG) 100 UNIT/ML injection    Sig: 3- 8 units subcutaneously before breakfast, lunch and dinner as per sliding scale    Dispense:  30 mL    Refill:  3    Follow-up: Return in about 1 month (around 07/18/2018) for Follow-up of chronic medical conditions.   Charlott Rakes MD

## 2018-06-17 NOTE — Patient Instructions (Signed)
Potassium Content of Foods    Potassium is a mineral found in many foods and drinks. It affects how the heart works, and helps keep fluids and minerals balanced in the body.  The amount of potassium you need each day depends on your age and any medical conditions you may have. Talk to your health care provider or dietitian about how much potassium you need.  The following lists of foods provide the general serving size for foods and the approximate amount of potassium in each serving, listed in milligrams (mg). Actual values may vary depending on the product and how it is processed.  High in potassium  The following foods and beverages have 200 mg or more of potassium per serving:  · Apricots (raw) - 2 have 200 mg of potassium.  · Apricots (dry) - 5 have 200 mg of potassium.  · Artichoke - 1 medium has 345 mg of potassium.  · Avocado - ¼ fruit has 245 mg of potassium.  · Banana - 1 medium fruit has 425 mg of potassium.  · Lima or baked beans (canned) - ½ cup has 280 mg of potassium.  · White beans (canned) - ½ cup has 595 mg potassium.  · Beef roast - 3 oz has 320 mg of potassium.  · Ground beef - 3 oz has 270 mg of potassium.  · Beets (raw or cooked) - ½ cup has 260 mg of potassium.  · Bran muffin - 2 oz has 300 mg of potassium.  · Broccoli (cooked) - ½ cup has 230 mg of potassium.  · Brussels sprouts - ½ cup has 250 mg of potassium.  · Cantaloupe - ½ cup has 215 mg of potassium.  · Cereal, 100% bran - ½ cup has 200-400 mg of potassium.  · Cheeseburger -1 single fast food burger has 225-400 mg of potassium.  · Chicken - 3 oz has 220 mg of potassium.  · Clams (canned) - 3 oz has 535 mg of potassium.  · Crab - 3 oz has 225 mg of potassium.  · Dates - 5 have 270 mg of potassium.  · Dried beans and peas - ½ cup has 300-475 mg of potassium.  · Figs (dried) - 2 have 260 mg of potassium.  · Fish (halibut, tuna, cod, snapper) - 3 oz has 480 mg of potassium.  · Fish (salmon, haddock, swordfish, perch) - 3 oz has 300 mg of  potassium.  · Fish (tuna, canned) - 3 oz has 200 mg of potassium.  · French fries (fast food) - 3 oz has 470 mg of potassium.  · Granola with fruit and nuts - ½ cup has 200 mg of potassium.  · Grapefruit juice - ½ cup has 200 mg of potassium.  · Honeydew melon - ½ cup has 200 mg of potassium.  · Kale (raw) - 1 cup has 300 mg of potassium.  · Kiwi - 1 medium fruit has 240 mg of potassium.  · Kohlrabi, rutabaga, parsnips - ½ cup has 280 mg of potassium.  · Lentils - ½ cup has 365 mg of potassium.  · Mango - 1 each has 325 mg of potassium.  · Milk (nonfat, low-fat, whole, buttermilk) - 1 cup has 350-380 mg of potassium.  · Milk (chocolate) - 1 cup has 420 mg of potassium  · Molasses - 1 Tbsp has 295 mg of potassium.  · Mushrooms - ½ cup has 280 mg of potassium.  · Nectarine - 1 each has   275 mg of potassium.  · Nuts (almonds, peanuts, hazelnuts, [place], cashew, mixed) - 1 oz has 200 mg of potassium.  · Nuts (pistachios) - 1 oz has 295 mg of potassium.  · Orange - 1 fruit has 240 mg of potassium.  · Orange juice - ½ cup has 235 mg of potassium.  · Papaya - ½ medium fruit has 390 mg of potassium.  · Peanut butter (chunky) - 2 Tbsp has 240 mg of potassium.  · Peanut butter (smooth) - 2 Tbsp has 210 mg of potassium.  · Pear - 1 medium (200 mg) of potassium.  · Pomegranate - 1 whole fruit has 400 mg of potassium.  · Pomegranate juice - ½ cup has 215 mg of potassium.  · Pork - 3 oz has 350 mg of potassium.  · Potato chips (salted) - 1 oz has 465 mg of potassium.  · Potato (baked with skin) - 1 medium has 925 mg of potassium.  · Potato (boiled) - ½ cup has 255 mg of potassium.  · Potato (Mashed) - ½ cup has 330 mg of potassium.  · Prune juice - ½ cup has 370 mg of potassium.  · Prunes - 5 have 305 mg of potassium.  · Pudding (chocolate) - ½ cup has 230 mg of potassium.  · Pumpkin (canned) - ½ cup has 250 mg of potassium.  · Raisins (seedless) - ¼ cup has 270 mg of potassium.  · Seeds (sunflower or pumpkin) - 1 oz has 240 mg of  potassium.  · Soy milk - 1 cup has 300 mg of potassium.  · Spinach (cooked) - 1/2 cup has 420 mg of potassium.  · Spinach (canned) - ½ cup has 370 mg of potassium.  · Sweet potato (baked with skin) - 1 medium has 450 mg of potassium.  · Swiss chard - ½ cup has 480 mg of potassium.  · Tomato or vegetable juice - ½ cup has 275 mg of potassium.  · Tomato (sauce or puree) - ½ cup has 400-550 mg of potassium.  · Tomato (raw) - 1 medium has 290 mg of potassium.  · Tomato (canned) - ½ cup has 200-300 mg of potassium.  · Turkey - 3 oz has 250 mg of potassium.  · Wheat germ - 1 oz has 250 mg of potassium.  · Winter squash - ½ cup has 250 mg of potassium.  · Yogurt (plain or fruited) - 6 oz has 260-435 mg of potassium.  · Zucchini - ½ cup has 220 mg of potassium.  Moderate in potassium  The following foods and beverages have 50-200 mg of potassium per serving:  · Apple - 1 fruit has 150 mg of potassium  · Apple juice - ½ cup has 150 mg of potassium  · Applesauce - ½ cup has 90 mg of potassium  · Apricot nectar - ½ cup has 140 mg of potassium  · Asparagus (small spears) - ½ cup has 155 mg of potassium  · Asparagus (large spears) - 6 have 155 mg of potassium  · Bagel (cinnamon raisin) - 1 four-inch bagel has 130 mg of potassium  · Bagel (egg or plain) - 1 four- inch bagel has 70 mg of potassium  · Beans (green) - ½ cup has 90 mg of potassium  · Beans (yellow) - ½ cup has 190 mg of potassium  · Beer, regular - 12 oz has 100 mg of potassium  · Beets (canned) - ½ cup has   125 mg of potassium  · Blackberries - ½ cup has 115 mg of potassium  · Blueberries - ½ cup has 60 mg of potassium  · Bread (whole wheat) - 1 slice has 70 mg of potassium  · Broccoli (raw) - ½ cup has 145 mg of potassium  · Cabbage - ½ cup has 150 mg of potassium  · Carrots (cooked or raw) - ½ cup has 180 mg of potassium  · Cauliflower (raw) - ½ cup has 150 mg of potassium  · Celery (raw) - ½ cup has 155 mg of potassium  · Cereal, bran flakes - ½ cup has 120-150 mg  of potassium  · Cheese (cottage) - ½ cup has 110 mg of potassium  · Cherries - 10 have 150 mg of potassium  · Chocolate - 1½ oz bar has 165 mg of potassium  · Coffee (brewed) - 6 oz has 90 mg of potassium  · Corn - ½ cup or 1 ear has 195 mg of potassium  · Cucumbers - ½ cup has 80 mg of potassium  · Egg - 1 large egg has 60 mg of potassium  · Eggplant - ½ cup has 60 mg of potassium  · Endive (raw) - ½ cup has 80 mg of potassium  · English muffin - 1 has 65 mg of potassium  · Fish (ocean perch) - 3 oz has 192 mg of potassium  · Frankfurter, beef or pork - 1 has 75 mg of potassium  · Fruit cocktail - ½ cup has 115 mg of potassium  · Grape juice - ½ cup has 170 mg of potassium  · Grapefruit - ½ fruit has 175 mg of potassium  · Grapes - ½ cup has 155 mg of potassium  · Greens: kale, turnip, collard - ½ cup has 110-150 mg of potassium  · Ice cream or frozen yogurt (chocolate) - ½ cup has 175 mg of potassium  · Ice cream or frozen yogurt (vanilla) - ½ cup has 120-150 mg of potassium  · Lemons, limes - 1 each has 80 mg of potassium  · Lettuce - 1 cup has 100 mg of potassium  · Mixed vegetables - ½ cup has 150 mg of potassium  · Mushrooms, raw - ½ cup has 110 mg of potassium  · Nuts (walnuts, pecans, or macadamia) - 1 oz has 125 mg of potassium  · Oatmeal - ½ cup has 80 mg of potassium  · Okra - ½ cup has 110 mg of potassium  · Onions - ½ cup has 120 mg of potassium  · Peach - 1 has 185 mg of potassium  · Peaches (canned) - ½ cup has 120 mg of potassium  · Pears (canned) - ½ cup has 120 mg of potassium  · Peas, green (frozen) - ½ cup has 90 mg of potassium  · Peppers (Green) - ½ cup has 130 mg of potassium  · Peppers (Red) - ½ cup has 160 mg of potassium  · Pineapple juice - ½ cup has 165 mg of potassium  · Pineapple (fresh or canned) - ½ cup has 100 mg of potassium  · Plums - 1 has 105 mg of potassium  · Pudding, vanilla - ½ cup has 150 mg of potassium  · Raspberries - ½ cup has 90 mg of potassium  · Rhubarb - ½ cup has  115 mg of potassium  · Rice, wild - ½ cup has 80 mg of potassium  ·   Shrimp - 3 oz has 155 mg of potassium  · Spinach (raw) - 1 cup has 170 mg of potassium  · Strawberries - ½ cup has 125 mg of potassium  · Summer squash - ½ cup has 175-200 mg of potassium  · Swiss chard (raw) - 1 cup has 135 mg of potassium  · Tangerines - 1 fruit has 140 mg of potassium  · Tea, brewed - 6 oz has 65 mg of potassium  · Turnips - ½ cup has 140 mg of potassium  · Watermelon - ½ cup has 85 mg of potassium  · Wine (Red, table) - 5 oz has 180 mg of potassium  · Wine (White, table) - 5 oz 100 mg of potassium  Low in potassium  The following foods and beverages have less than 50 mg of potassium per serving.  · Bread (white) - 1 slice has 30 mg of potassium  · Carbonated beverages - 12 oz has less than 5 mg of potassium  · Cheese - 1 oz has 20-30 mg of potassium  · Cranberries - ½ cup has 45 mg of potassium  · Cranberry juice cocktail - ½ cup has 20 mg of potassium  · Fats and oils - 1 Tbsp has less than 5 mg of potassium  · Hummus - 1 Tbsp has 32 mg of potassium  · Nectar (papaya, mango, or pear) - ½ cup has 35 mg of potassium  · Rice (white or brown) - ½ cup has 50 mg of potassium  · Spaghetti or macaroni (cooked) - ½ cup has 30 mg of potassium  · Tortilla, flour or corn - 1 has 50 mg of potassium  · Waffle - 1 four-inch waffle has 50 mg of potassium  · Water chestnuts - ½ cup has 40 mg of potassium  Summary  · Potassium is a mineral found in many foods and drinks. It affects how the heart works, and helps keep fluids and minerals balanced in the body.  · The amount of potassium you need each day depends on your age and any existing medical conditions you may have. Your health care provider or dietitian may recommend an amount of potassium that you should have each day.  This information is not intended to replace advice given to you by your health care provider. Make sure you discuss any questions you have with your health care  provider.  Document Released: 12/20/2004 Document Revised: 08/02/2016 Document Reviewed: 08/02/2016  Elsevier Interactive Patient Education © 2019 [employer] Inc.

## 2018-06-17 NOTE — Telephone Encounter (Signed)
Met with the patient when he was in the clinic today for his appointment .  Reminded him to pick up his glucometer and testing supplies which he said he would do today.  He also spoke about his lack of insurance and noted that he will speak to his employer first before applying for the [employer] and [employer].

## 2018-06-18 LAB — BASIC METABOLIC PANEL
BUN/Creatinine Ratio: 15 (ref 9–20)
BUN: 8 mg/dL (ref 6–20)
CHLORIDE: 102 mmol/L (ref 96–106)
CO2: 23 mmol/L (ref 20–29)
Calcium: 8.7 mg/dL (ref 8.7–10.2)
Creatinine, Ser: 0.52 mg/dL — ABNORMAL LOW (ref 0.76–1.27)
GFR calc Af Amer: 161 mL/min/{1.73_m2} (ref >59)
GFR calc non Af Amer: 139 mL/min/{1.73_m2} (ref >59)
Glucose: 195 mg/dL — ABNORMAL HIGH (ref 65–99)
Potassium: 4.3 mmol/L (ref 3.5–5.2)
Sodium: 139 mmol/L (ref 134–144)

## 2018-07-02 LAB — BLOOD GAS, ARTERIAL
Drawn by: 295031
FIO2: 21
O2 Saturation: 96.3 %
Patient temperature: 98.6
pH, Arterial: 6.953 — CL (ref 7.350–7.450)
pO2, Arterial: 149 mmHg — ABNORMAL HIGH (ref 83.0–108.0)

## 2018-07-18 ENCOUNTER — Ambulatory Visit: Payer: Self-pay | Admitting: Family Medicine

## 2018-07-23 MED FILL — NovoLOG 100 UNIT/ML SOLN: 100 | 28 days supply | Qty: 10 | Fill #0

## 2018-08-22 ENCOUNTER — Inpatient Hospital Stay (HOSPITAL_COMMUNITY)
Admission: EM | Admit: 2018-08-22 | Discharge: 2018-08-23 | DRG: 637 | Disposition: A | Discharge status: Home / Self Care | Payer: Self-pay | Attending: Internal Medicine | Admitting: Internal Medicine

## 2018-08-22 ENCOUNTER — Other Ambulatory Visit: Payer: Self-pay

## 2018-08-22 ENCOUNTER — Encounter (HOSPITAL_COMMUNITY): Payer: Self-pay

## 2018-08-22 DIAGNOSIS — N179 Acute kidney failure, unspecified: Secondary | ICD-10-CM | POA: Diagnosis present

## 2018-08-22 DIAGNOSIS — E43 Unspecified severe protein-calorie malnutrition: Secondary | ICD-10-CM | POA: Diagnosis present

## 2018-08-22 DIAGNOSIS — F1910 Other psychoactive substance abuse, uncomplicated: Secondary | ICD-10-CM | POA: Diagnosis present

## 2018-08-22 DIAGNOSIS — E1065 Type 1 diabetes mellitus with hyperglycemia: Secondary | ICD-10-CM | POA: Diagnosis present

## 2018-08-22 DIAGNOSIS — E876 Hypokalemia: Secondary | ICD-10-CM | POA: Diagnosis present

## 2018-08-22 DIAGNOSIS — Z794 Long term (current) use of insulin: Secondary | ICD-10-CM

## 2018-08-22 DIAGNOSIS — E860 Dehydration: Secondary | ICD-10-CM | POA: Diagnosis present

## 2018-08-22 DIAGNOSIS — Z91128 Patient's intentional underdosing of medication regimen for other reason: Secondary | ICD-10-CM

## 2018-08-22 DIAGNOSIS — Z87891 Personal history of nicotine dependence: Secondary | ICD-10-CM

## 2018-08-22 DIAGNOSIS — T383X6A Underdosing of insulin and oral hypoglycemic [antidiabetic] drugs, initial encounter: Secondary | ICD-10-CM | POA: Diagnosis present

## 2018-08-22 DIAGNOSIS — IMO0002 Reserved for concepts with insufficient information to code with codable children: Secondary | ICD-10-CM | POA: Diagnosis present

## 2018-08-22 DIAGNOSIS — Z833 Family history of diabetes mellitus: Secondary | ICD-10-CM

## 2018-08-22 DIAGNOSIS — E1010 Type 1 diabetes mellitus with ketoacidosis without coma: Principal | ICD-10-CM | POA: Diagnosis present

## 2018-08-22 DIAGNOSIS — F4323 Adjustment disorder with mixed anxiety and depressed mood: Secondary | ICD-10-CM | POA: Diagnosis present

## 2018-08-22 LAB — URINALYSIS, ROUTINE W REFLEX MICROSCOPIC
Bilirubin Urine: NEGATIVE
Glucose, UA: >=500 mg/dL — AB
Hgb urine dipstick: NEGATIVE
Ketones, ur: 80 mg/dL — AB
Leukocytes,Ua: NEGATIVE
Nitrite: NEGATIVE
Protein, ur: NEGATIVE mg/dL
Specific Gravity, Urine: 1.028 (ref 1.005–1.030)
pH: 5 (ref 5.0–8.0)

## 2018-08-22 LAB — CBC WITH DIFFERENTIAL/PLATELET
Abs Immature Granulocytes: 0.03 10*3/uL (ref 0.00–0.07)
Basophils Absolute: 0 10*3/uL (ref 0.0–0.1)
Basophils Relative: 0 %
Eosinophils Absolute: 0 10*3/uL (ref 0.0–0.5)
Eosinophils Relative: 0 %
HCT: 46.8 % (ref 39.0–52.0)
Hemoglobin: 15.8 g/dL (ref 13.0–17.0)
Immature Granulocytes: 0 %
Lymphocytes Relative: 9 %
Lymphs Abs: 0.6 10*3/uL — ABNORMAL LOW (ref 0.7–4.0)
MCH: 30.6 pg (ref 26.0–34.0)
MCHC: 33.8 g/dL (ref 30.0–36.0)
MCV: 90.7 fL (ref 80.0–100.0)
Monocytes Absolute: 0.2 10*3/uL (ref 0.1–1.0)
Monocytes Relative: 2 %
Neutro Abs: 6.5 10*3/uL (ref 1.7–7.7)
Neutrophils Relative %: 89 %
Platelets: 371 10*3/uL (ref 150–400)
RBC: 5.16 MIL/uL (ref 4.22–5.81)
RDW: 12.1 % (ref 11.5–15.5)
WBC: 7.4 10*3/uL (ref 4.0–10.5)
nRBC: 0 % (ref 0.0–0.2)

## 2018-08-22 LAB — CBG MONITORING, ED
Glucose-Capillary: 508 mg/dL (ref 70–99)
Glucose-Capillary: 457 mg/dL — ABNORMAL HIGH (ref 70–99)
Glucose-Capillary: 343 mg/dL — ABNORMAL HIGH (ref 70–99)
Glucose-Capillary: 247 mg/dL — ABNORMAL HIGH (ref 70–99)

## 2018-08-22 LAB — GLUCOSE, CAPILLARY
Glucose-Capillary: 114 mg/dL — ABNORMAL HIGH (ref 70–99)
Glucose-Capillary: 116 mg/dL — ABNORMAL HIGH (ref 70–99)
Glucose-Capillary: 117 mg/dL — ABNORMAL HIGH (ref 70–99)
Glucose-Capillary: 130 mg/dL — ABNORMAL HIGH (ref 70–99)
Glucose-Capillary: 138 mg/dL — ABNORMAL HIGH (ref 70–99)
Glucose-Capillary: 169 mg/dL — ABNORMAL HIGH (ref 70–99)
Glucose-Capillary: 203 mg/dL — ABNORMAL HIGH (ref 70–99)
Glucose-Capillary: 206 mg/dL — ABNORMAL HIGH (ref 70–99)
Glucose-Capillary: 227 mg/dL — ABNORMAL HIGH (ref 70–99)

## 2018-08-22 LAB — HEMOGLOBIN A1C
Hgb A1c MFr Bld: 12.8 % — ABNORMAL HIGH (ref 4.8–5.6)
Mean Plasma Glucose: 320.66 mg/dL

## 2018-08-22 LAB — BASIC METABOLIC PANEL
Anion gap: 27 — ABNORMAL HIGH (ref 5–15)
Anion gap: 8 (ref 5–15)
BUN: 30 mg/dL — ABNORMAL HIGH (ref 6–20)
BUN: 18 mg/dL (ref 6–20)
CO2: 10 mmol/L — ABNORMAL LOW (ref 22–32)
CO2: 18 mmol/L — ABNORMAL LOW (ref 22–32)
Calcium: 9.7 mg/dL (ref 8.9–10.3)
Calcium: 7.7 mg/dL — ABNORMAL LOW (ref 8.9–10.3)
Chloride: 91 mmol/L — ABNORMAL LOW (ref 98–111)
Chloride: 108 mmol/L (ref 98–111)
Creatinine, Ser: 1.18 mg/dL (ref 0.61–1.24)
Creatinine, Ser: 0.9 mg/dL (ref 0.61–1.24)
GFR calc Af Amer: >60 mL/min (ref >60)
GFR calc Af Amer: >60 mL/min (ref >60)
GFR calc non Af Amer: >60 mL/min (ref >60)
GFR calc non Af Amer: >60 mL/min (ref >60)
Glucose, Bld: 529 mg/dL (ref 70–99)
Glucose, Bld: 137 mg/dL — ABNORMAL HIGH (ref 70–99)
Potassium: 4.7 mmol/L (ref 3.5–5.1)
Potassium: 3.4 mmol/L — ABNORMAL LOW (ref 3.5–5.1)
Sodium: 128 mmol/L — ABNORMAL LOW (ref 135–145)
Sodium: 134 mmol/L — ABNORMAL LOW (ref 135–145)

## 2018-08-22 LAB — BLOOD GAS, VENOUS
Acid-base deficit: 16.7 mmol/L — ABNORMAL HIGH (ref 0.0–2.0)
Bicarbonate: 10.8 mmol/L — ABNORMAL LOW (ref 20.0–28.0)
O2 Saturation: 74 %
Patient temperature: 98.6
pCO2, Ven: 30 mmHg — ABNORMAL LOW (ref 44.0–60.0)
pH, Ven: 7.181 — CL (ref 7.250–7.430)
pO2, Ven: 46 mmHg — ABNORMAL HIGH (ref 32.0–45.0)

## 2018-08-22 LAB — LACTIC ACID, PLASMA: Lactic Acid, Venous: 1.2 mmol/L (ref 0.5–1.9)

## 2018-08-22 LAB — MRSA PCR SCREENING: MRSA by PCR: NEGATIVE

## 2018-08-22 MED ORDER — STERILE WATER FOR INJECTION IV SOLN
INTRAVENOUS | Status: AC
  Administered 2018-08-22 – 2018-08-23 (×2): via INTRAVENOUS
  Filled 2018-08-22 (×2): qty 850

## 2018-08-22 MED ORDER — ENOXAPARIN SODIUM 40 MG/0.4ML ~~LOC~~ SOLN
40.0000 mg | SUBCUTANEOUS | Status: DC
  Filled 2018-08-22: qty 0.4

## 2018-08-22 MED ORDER — ONDANSETRON HCL 4 MG/2ML IJ SOLN: 4.0000 mg | Freq: Four times a day (QID) | INTRAMUSCULAR | Status: DC | PRN

## 2018-08-22 MED ORDER — SODIUM CHLORIDE 0.9 % IV BOLUS
1000.0000 mL | Freq: Once | INTRAVENOUS | Status: AC
  Administered 2018-08-22: 1000 mL via INTRAVENOUS

## 2018-08-22 MED ORDER — DEXTROSE-NACL 5-0.45 % IV SOLN
INTRAVENOUS | Status: DC
  Administered 2018-08-22 – 2018-08-23 (×2): via INTRAVENOUS

## 2018-08-22 MED ORDER — DEXTROSE-NACL 5-0.45 % IV SOLN: INTRAVENOUS | Status: DC

## 2018-08-22 MED ORDER — INSULIN REGULAR(HUMAN) IN NACL 100-0.9 UT/100ML-% IV SOLN
INTRAVENOUS | Status: DC
  Administered 2018-08-22: 11:00:00 4 [IU]/h via INTRAVENOUS
  Administered 2018-08-22 – 2018-08-23 (×2): 4.4 [IU]/h via INTRAVENOUS
  Administered 2018-08-23: 3 [IU]/h via INTRAVENOUS
  Administered 2018-08-23 (×2): 1.1 [IU]/h via INTRAVENOUS
  Filled 2018-08-22 (×2): qty 100

## 2018-08-22 MED ORDER — SODIUM CHLORIDE 0.9 % IV SOLN
INTRAVENOUS | Status: DC
  Administered 2018-08-22: 11:00:00 via INTRAVENOUS

## 2018-08-22 MED ORDER — SODIUM CHLORIDE 0.9 % IV SOLN: INTRAVENOUS | Status: DC

## 2018-08-22 MED ORDER — POTASSIUM CHLORIDE 10 MEQ/100ML IV SOLN
10.0000 meq | INTRAVENOUS | Status: AC
  Administered 2018-08-22 (×2): 10 meq via INTRAVENOUS
  Filled 2018-08-22 (×2): qty 100

## 2018-08-22 MED ORDER — ACETAMINOPHEN 325 MG PO TABS
650.0000 mg | ORAL_TABLET | Freq: Four times a day (QID) | ORAL | Status: DC | PRN
  Administered 2018-08-22: 650 mg via ORAL
  Filled 2018-08-22: qty 2

## 2018-08-22 MED ORDER — ONDANSETRON HCL 4 MG/2ML IJ SOLN
4.0000 mg | Freq: Once | INTRAMUSCULAR | Status: AC
  Administered 2018-08-22: 4 mg via INTRAVENOUS
  Filled 2018-08-22: qty 2

## 2018-08-22 NOTE — ED Notes (Signed)
Bed: WA09 Expected date:  Expected time:  Means of arrival:  Comments: EMS Hyperglycemia 508

## 2018-08-22 NOTE — ED Triage Notes (Signed)
Per  EMS: Pt called out for diabetic issues, just wanting to be assessed.  Pt's CBG was 508.  Pt is tachycardic at 136.  Pt c/o of feeling "bad" for 3 days.

## 2018-08-22 NOTE — ED Provider Notes (Signed)
[facility] DEPT Provider Note   CSN: [id number] Arrival date & time: 08/22/18  0941    History   Chief Complaint Chief Complaint  Patient presents with  . Hyperglycemia    HPI [name] [name] is a 33 y.o. male with type I diabetes mellitus presents for evaluation of acute onset, progressively worsening nausea, vomiting, fatigue for 3 days.  Reports his blood sugars have also been increased.  States he has been compliant with his home medications.  Reports baseline blood sugars around 170 but in the last few days have been over 250.  Denies fevers, chills, chest pain, shortness of breath, abdominal pain.  Denies urinary symptoms.  Has been trying to drink water with little relief.  States "I think I am in DKA ". Denies recent illness.     The history is provided by the patient.    Past Medical History:  Diagnosis Date  . ARF (acute renal failure) ([place]) 2011   pt was on dialysis "for 1 day approx 2011, pt is no longer on dialysis (03/09/2017)  . DKA (diabetic ketoacidoses) ([place])    "several times" (03/09/2017)  . Type I diabetes mellitus [facility]) Dx 2012    Patient Active Problem List   Diagnosis Date Noted  . DKA (diabetic ketoacidoses) ([place]) 05/25/2018  . Nausea & vomiting 11/29/2017  . Diabetes mellitus type 1, uncontrolled, with complications ([place])   . Noncompliance by refusing service   . DKA, type 1 ([place]) 03/09/2017  . Hypophosphatemia 08/27/2016  . AKI (acute kidney injury) ([place]) 07/11/2016  . Hyperkalemia 03/09/2016  . Leucocytosis 03/09/2016  . Diabetic ketoacidosis without coma associated with type 1 diabetes mellitus ([place])   . Non-intractable vomiting with nausea   . Chest pain 12/18/2015  . Marijuana use 07/22/2015  . Proteinuria with type 1 diabetes mellitus ([place]) 07/22/2015  . Hypertension complicating diabetes ([place]) 07/22/2015  . Lactic acidosis   . Uncontrolled type 1 diabetes mellitus with ketoacidosis without coma ([place])   .  Hypocalcemia   . Acute renal failure ([place])   . Hypokalemia   . Hypomagnesemia   . Abnormal EKG 06/15/2015  . Sinus tachycardia   . Tachypnea   . Tinea pedis 12/17/2014  . Adjustment disorder with mixed anxiety and depressed mood 09/03/2014  . Protein-calorie malnutrition, severe ([place]) 07/06/2014  . DM (diabetes mellitus), type 1, uncontrolled ([place]) 05/01/2014  . HLD (hyperlipidemia) 04/26/2014  . Substance abuse ([place])   . Dehydration 07/07/2013  . Fatigue 03/10/2012    Past Surgical History:  Procedure Laterality Date  . NO PAST SURGERIES          Home Medications    Prior to Admission medications   Medication Sig Start Date End Date Taking? Authorizing Provider  insulin aspart (NOVOLOG) 100 UNIT/ML injection 3- 8 units subcutaneously before breakfast, lunch and dinner as per sliding scale Patient taking differently: Inject 10 Units into the skin 3 (three) times daily with meals.  06/17/18  Yes Charlott Rakes, MD  Insulin Glargine (LANTUS) 100 UNIT/ML Solostar Pen Inject 15 Units into the skin 2 (two) times daily for 30 days. Patient taking differently: Inject 18 Units into the skin 2 (two) times daily.  06/17/18 08/22/18 Yes Charlott Rakes, MD  blood glucose meter kit and supplies KIT Dispense based on patient and insurance preference. Use up to four times daily as directed. (FOR ICD-9 250.00, 250.01). Patient not taking: Reported on 06/17/2018 06/11/18   Elodia Florence., MD  Blood Glucose Monitoring Suppl (  TRUE METRIX METER) w/Device KIT 1 each by Other route 3 (three) times daily. Use as directed three times daily Patient not taking: Reported on 06/17/2018 05/29/18   Charlott Rakes, MD  glucose blood (TRUE METRIX BLOOD GLUCOSE TEST) test strip Use as instructed three times daily 05/29/18   Charlott Rakes, MD  TRUEPLUS LANCETS 28G MISC Use as directed three times daily 05/29/18   Charlott Rakes, MD    Family History Family History  Problem Relation Age of Onset  .  Diabetes Father   . Diabetes Maternal Grandmother   . Heart disease Neg Hx   . Hypertension Neg Hx     Social History Social History   Tobacco Use  . Smoking status: Former Smoker    Packs/day: 0.25    Years: 10.00    Pack years: 2.50    Types: Cigarettes    Last attempt to quit: 09/19/2013    Years since quitting: 4.9  . Smokeless tobacco: Never Used  Substance Use Topics  . Alcohol use: Yes    Alcohol/week: 0.0 standard drinks    Comment: 03/09/2017 "normally I don't drink; drank 3 shots liquor 2 days ago"  . Drug use: No     Allergies   Patient has no known allergies.   Review of Systems Review of Systems  Constitutional: Positive for fatigue. Negative for fever.  Respiratory: Positive for cough.   Cardiovascular: Negative for chest pain.  Gastrointestinal: Positive for nausea and vomiting.  All other systems reviewed and are negative.    Physical Exam Updated Vital Signs BP (!) 139/93   Pulse (!) 136   Temp 98.7 F (37.1 C) (Rectal)   Resp 17   Ht _0  (1.676 m)   Wt 51.3 kg   SpO2 99%   BMI 18.24 kg/m   Physical Exam Vitals signs and nursing note reviewed.  Constitutional:      General: He is not in acute distress.    Appearance: He is well-developed.  HENT:     Head: Normocephalic and atraumatic.  Eyes:     General:        Right eye: No discharge.        Left eye: No discharge.     Conjunctiva/sclera: Conjunctivae normal.  Neck:     Vascular: No JVD.     Trachea: No tracheal deviation.  Cardiovascular:     Rate and Rhythm: Tachycardia present.  Pulmonary:     Breath sounds: Normal breath sounds.     Comments: tachypneic Abdominal:     General: Abdomen is flat. Bowel sounds are normal. There is no distension.     Palpations: Abdomen is soft.     Tenderness: There is no abdominal tenderness. There is no guarding or rebound.  Skin:    General: Skin is warm and dry.     Findings: No erythema.  Neurological:     Mental Status: He is  alert.  Psychiatric:        Behavior: Behavior normal.      ED Treatments / Results  Labs (all labs ordered are listed, but only abnormal results are displayed) Labs Reviewed  BASIC METABOLIC PANEL - Abnormal; Notable for the following components:      Result Value   Sodium 128 (*)    Chloride 91 (*)    CO2 10 (*)    Glucose, Bld 529 (*)    BUN 30 (*)    Anion gap 27 (*)    All other components within  normal limits  URINALYSIS, ROUTINE W REFLEX MICROSCOPIC - Abnormal; Notable for the following components:   Color, Urine STRAW (*)    Glucose, UA >=500 (*)    Ketones, ur 80 (*)    Bacteria, UA RARE (*)    All other components within normal limits  CBC WITH DIFFERENTIAL/PLATELET - Abnormal; Notable for the following components:   Lymphs Abs 0.6 (*)    All other components within normal limits  BLOOD GAS, VENOUS - Abnormal; Notable for the following components:   pH, Ven 7.181 (*)    pCO2, Ven 30.0 (*)    pO2, Ven 46.0 (*)    Bicarbonate 10.8 (*)    Acid-base deficit 16.7 (*)    All other components within normal limits  CBG MONITORING, ED - Abnormal; Notable for the following components:   Glucose-Capillary 508 (*)    All other components within normal limits  LACTIC ACID, PLASMA  LACTIC ACID, PLASMA  BASIC METABOLIC PANEL  BASIC METABOLIC PANEL  BASIC METABOLIC PANEL  BASIC METABOLIC PANEL  HEMOGLOBIN A1C    EKG EKG Interpretation  Date/Time:  Thursday August 22 2018 10:15:56 EDT Ventricular Rate:  136 PR Interval:    QRS Duration: 70 QT Interval:  276 QTC Calculation: 416 R Axis:   87 Text Interpretation:  Sinus tachycardia Anteroseptal infarct, old Confirmed by Lacretia Leigh ([id number]) on 08/22/2018 11:39:03 AM   Radiology No results found.  Procedures .Critical Care Performed by: Renita Papa, PA-C Authorized by: Renita Papa, PA-C   Critical care provider statement:    Critical care time (minutes):  35   Critical care was necessary to treat or  prevent imminent or life-threatening deterioration of the following conditions:  Metabolic crisis   Critical care was time spent personally by me on the following activities:  Discussions with consultants, evaluation of patient's response to treatment, examination of patient, ordering and performing treatments and interventions, ordering and review of laboratory studies, ordering and review of radiographic studies, pulse oximetry, re-evaluation of patient's condition, obtaining history from patient or surrogate and review of old charts   I assumed direction of critical care for this patient from another provider in my specialty: no     (including critical care time)  Medications Ordered in ED Medications  insulin regular, human (MYXREDLIN) 100 units/ 100 mL infusion (4 Units/hr Intravenous New Bag/Given 08/22/18 1124)  sodium chloride 0.9 % bolus 1,000 mL (1,000 mLs Intravenous New Bag/Given 08/22/18 1116)    And  0.9 %  sodium chloride infusion ( Intravenous New Bag/Given 08/22/18 1124)  dextrose 5 %-0.45 % sodium chloride infusion (has no administration in time range)  potassium chloride 10 mEq in 100 mL IVPB (10 mEq Intravenous New Bag/Given 08/22/18 1117)  sodium bicarbonate 150 mEq in sterile water 1,000 mL infusion (has no administration in time range)  ondansetron (ZOFRAN) injection 4 mg (has no administration in time range)  acetaminophen (TYLENOL) tablet 650 mg (has no administration in time range)  sodium chloride 0.9 % bolus 1,000 mL (0 mLs Intravenous Stopped 08/22/18 1126)  ondansetron (ZOFRAN) injection 4 mg (4 mg Intravenous Given 08/22/18 1113)     Initial Impression / Assessment and Plan / ED Course  I have reviewed the triage vital signs and the nursing notes.  Pertinent labs & imaging results that were available during my care of the patient were reviewed by me and considered in my medical decision making (see chart for details).        Patient  presenting for evaluation of  nausea vomiting fatigue.  He is afebrile, tachycardic in the ED.  Tachypneic.  Mentating well.  Reports he has been compliant with his insulin.  Presentation and work-up suggestive of DKA with hyperglycemia, elevated anion gap, bicarb of 10, ketonuria, and blood pH of 7.181.  Started on insulin drip, given IV fluids and potassium.  Spoke with Dr. Nevada Crane with [facility] hospitalist service who agrees to assume care of patient and bring him into the hospital for further evaluation and management.  Final Clinical Impressions(s) / ED Diagnoses   Final diagnoses:  Diabetic ketoacidosis without coma associated with type 1 diabetes mellitus [facility])    ED Discharge Orders    None       Debroah Baller 08/22/18 1155    Lacretia Leigh, MD 08/26/18 1445

## 2018-08-22 NOTE — ED Notes (Addendum)
Date and time results received: 08/22/18 1048 (use smartphrase ".now" to insert current time)  Test: glucose Critical Value: 529  Name of Provider Notified: Mina PA  Orders Received? Or Actions Taken?: Orders Received - See Orders for details

## 2018-08-22 NOTE — ED Provider Notes (Signed)
Medical screening examination/treatment/procedure(s) were conducted as a shared visit with non-physician practitioner(s) and myself.  I personally evaluated the patient during the encounter.  EKG Interpretation  Date/Time:  Thursday August 22 2018 10:15:56 EDT Ventricular Rate:  136 PR Interval:    QRS Duration: 70 QT Interval:  276 QTC Calculation: 416 R Axis:   87 Text Interpretation:  Sinus tachycardia Anteroseptal infarct, old Confirmed by Lorre Nick ([id number]) on 08/22/2018 11:99:70 AM   33 year old male with history of type 1 diabetes presents with nausea and vomiting fatigue x3 days.  Patient's labs here are consistent with diabetic ketoacidosis.  Given IV fluids as well as started on insulin drip and will be admitted to the hospitalist service.   Lorre Nick, MD 08/22/18 1204

## 2018-08-22 NOTE — H&P (Signed)
History and Physical  [name] [name] MGQ:[id number] DOB: 06/16/1985 DOA: 08/22/2018  Referring physician: Dr [name] [name] PCP: [name] Rakes, MD  Outpatient Specialists: None Patient coming from: Home  Chief Complaint: Generalized weakness and elevated blood sugar  HPI: [name] [name] is a 33 y.o. male with medical history significant for type 1 diabetes, previous DKA secondary to noncompliance, who presented to the [facility] ED with complaints of generalized weakness, increase in urinary frequency and elevated blood sugar.  Patient states his symptoms are similar to previous DKA.  Denies running out of his medications.  States he was taking his insulin.  Denies fever, cough, or known exposure to sick contacts.  Patient works as a Scientist, clinical (histocompatibility and immunogenetics) at [employer] and unaware of any COVID-19 exposure.  No recent travel history.  He admits to intermittent dyspnea stating it feels like previous DKA.  No chest pain.  No worsening or improving factors.  Denies diarrhea or constipation.  Admits to vomiting once on Tuesday 2 days ago.  Has had polyuria and poor appetite.  ED Course: Upon presentation to the ED vital signs remarkable for tachycardia with rates in the 130s, tachypnea with respiration rate in the 20s.  Lab studies remarkable for significant anion gap with metabolic acidosis and ketonuria noted on urine analysis.  TRH asked to admit for DKA type I.  Review of Systems: Review of systems as noted in the HPI. All other systems reviewed and are negative.   Past Medical History:  Diagnosis Date  . ARF (acute renal failure) ([place]) 2011   pt was on dialysis "for 1 day approx 2011, pt is no longer on dialysis (03/09/2017)  . DKA (diabetic ketoacidoses) ([place])    "several times" (03/09/2017)  . Type I diabetes mellitus ([place]) Dx 2012   Past Surgical History:  Procedure Laterality Date  . NO PAST SURGERIES      Social History:  reports that he quit smoking about 4 years ago. His smoking use included  cigarettes. He has a 2.50 pack-year smoking history. He has never used smokeless tobacco. He reports current alcohol use. He reports that he does not use drugs.   No Known Allergies  Family History  Problem Relation Age of Onset  . Diabetes Father   . Diabetes Maternal Grandmother   . Heart disease Neg Hx   . Hypertension Neg Hx      Prior to Admission medications   Medication Sig Start Date End Date Taking? Authorizing Provider  insulin aspart (NOVOLOG) 100 UNIT/ML injection 3- 8 units subcutaneously before breakfast, lunch and dinner as per sliding scale Patient taking differently: Inject 10 Units into the skin 3 (three) times daily with meals.  06/17/18  Yes [name] Rakes, MD  Insulin Glargine (LANTUS) 100 UNIT/ML Solostar Pen Inject 15 Units into the skin 2 (two) times daily for 30 days. Patient taking differently: Inject 18 Units into the skin 2 (two) times daily.  06/17/18 08/22/18 Yes [name] Rakes, MD  blood glucose meter kit and supplies KIT Dispense based on patient and insurance preference. Use up to four times daily as directed. (FOR ICD-9 250.00, 250.01). Patient not taking: Reported on 06/17/2018 06/11/18   [name] [name]., MD  Blood Glucose Monitoring Suppl (TRUE METRIX METER) w/Device KIT 1 each by Other route 3 (three) times daily. Use as directed three times daily Patient not taking: Reported on 06/17/2018 05/29/18   [name] Rakes, MD  glucose blood (TRUE METRIX BLOOD GLUCOSE TEST) test strip Use as instructed three times daily  05/29/18   [name] Rakes, MD  TRUEPLUS LANCETS 28G MISC Use as directed three times daily 05/29/18   [name] Rakes, MD    Physical Exam: BP (!) 139/93   Pulse (!) 136   Temp 98.7 F (37.1 C) (Rectal)   Resp 17   Ht '5\' 6"'$  (1.676 m)   Wt 51.3 kg   SpO2 99%   BMI 18.24 kg/m   . General: 33 y.o. year-old male well developed well nourished in no acute distress.  Alert and oriented x3.  Dry mucous membranes. . Cardiovascular: Regular  rate and rhythm with no rubs or gallops.  No thyromegaly or JVD noted.  No lower extremity edema. 2/4 pulses in all 4 extremities. Marland Kitchen Respiratory: Clear to auscultation with no wheezes or rales. Good inspiratory effort. . Abdomen: Soft nontender nondistended with normal bowel sounds x4 quadrants. . Muskuloskeletal: No cyanosis, clubbing or edema noted bilaterally . Neuro: CN II-XII intact, strength, sensation, reflexes . Skin: No ulcerative lesions noted or rashes . Psychiatry: Judgement and insight appear normal. Mood is appropriate for condition and setting          Labs on Admission:  Basic Metabolic Panel: Recent Labs  Lab 08/22/18 1015  NA 128*  K 4.7  CL 91*  CO2 10*  GLUCOSE 529*  BUN 30*  CREATININE 1.18  CALCIUM 9.7   Liver Function Tests: No results for input(s): AST, ALT, ALKPHOS, BILITOT, PROT, ALBUMIN in the last 168 hours. No results for input(s): LIPASE, AMYLASE in the last 168 hours. No results for input(s): AMMONIA in the last 168 hours. CBC: Recent Labs  Lab 08/22/18 1015  WBC 7.4  NEUTROABS 6.5  HGB 15.8  HCT 46.8  MCV 90.7  PLT 371   Cardiac Enzymes: No results for input(s): CKTOTAL, CKMB, CKMBINDEX, TROPONINI in the last 168 hours.  BNP (last 3 results) No results for input(s): BNP in the last 8760 hours.  ProBNP (last 3 results) No results for input(s): PROBNP in the last 8760 hours.  CBG: Recent Labs  Lab 08/22/18 0953  GLUCAP 508*    Radiological Exams on Admission: No results found.  EKG: I independently viewed the EKG done and my findings are as followed: Sinus tachycardia rate of 136 with no specific ST-T changes.  Assessment/Plan Present on Admission: . DKA, type 1 ([place])  Active Problems:   DKA, type 1 ([place])  DKA type I Presented with dehydration, chemistry blood sugar 529, sodium 128, bicarb 10, and anion gap of 27. No known sick exposure or recent traveling Denies running out of his medication; states he was compliant  Afebrile with no leukocytosis Lactic acid unremarkable Urine analysis remarkable for ketonuria and glucosuria Started on DKA protocol in the ED, continue N.p.o. except for sips and meds Continue IV fluid hydration Monitor urine output BMP every 4 hours x1 day Diabetes coordinator consulted Had VBG done in the ED  Anion gap metabolic acidosis Anion gap 27 and bicarb of 10 Start isotonic bicarb at 75 cc/h x 1 day Continue normal saline at 75 cc/h, adjust rate as indicated BMP 4 hours  AKI suspect prerenal from dehydration Baseline creatinine 0.4 with GFR greater than 60 Presented with creatinine of 1.18 Continue IV fluid hydration Monitor urine output Repeat BMP in the morning  Type 1 diabetes with hyperglycemia Management as stated above Obtain A1c  Pseudohyponatremia Presented with sodium 128 and chemistry blood sugar of 529 Repeat Bmet in the morning  Risks: High risk for decompensation due to DKA  type I, metabolic acidosis.  Patient will require at least 2 midnights for further assessment and treatment of present condition.    DVT prophylaxis: Subcu Lovenox daily  Code Status: Full code  Family Communication: None at bedside  Disposition Plan: Admit to stepdown unit for IV insulin infusion  Consults called: None  Admission status: Inpatient status    Kayleen Memos MD [facility] Pager [phone]  If 7PM-7AM, please contact night-coverage [email or web address] Password [facility]  08/22/2018, 11:54 AM

## 2018-08-22 NOTE — ED Notes (Signed)
ED TO INPATIENT HANDOFF REPORT  ED Nurse Name and Phone #:  Maddie RN, [phone]  S Name/Age/Gender [name] [name] 33 y.o. male Room/Bed: WA09/WA09  Code Status   Code Status: Full Code  Home/SNF/Other Home {Patient oriented to: A&Ox4 Is this baseline? Yes   Triage Complete: Triage complete  Chief Complaint hyperglycemia  Triage Note Per  EMS: Pt called out for diabetic issues, just wanting to be assessed.  Pt's CBG was 508.  Pt is tachycardic at 136.  Pt c/o of feeling "bad" for 3 days.   Allergies No Known Allergies  Level of Care/Admitting Diagnosis ED Disposition    ED Disposition Condition Comment   Admit  Hospital Area: [facility] [[id number]]  Level of Care: Progressive [102]  Diagnosis: DKA, type 1 [facility]) [[id number]]  Admitting Physician: Darlin Drop [[id number]]  Attending Physician: Darlin Drop [[id number]]  Estimated length of stay: past midnight tomorrow  Certification:: I certify this patient will need inpatient services for at least 2 midnights  PT Class (Do Not Modify): Inpatient [101]  PT Acc Code (Do Not Modify): Private [1]       B Medical/Surgery History Past Medical History:  Diagnosis Date  . ARF (acute renal failure) ([facility]) 2011   pt was on dialysis "for 1 day approx 2011, pt is no longer on dialysis (03/09/2017)  . DKA (diabetic ketoacidoses) ([facility])    "several times" (03/09/2017)  . Type I diabetes mellitus ([facility]) Dx 2012   Past Surgical History:  Procedure Laterality Date  . NO PAST SURGERIES       A IV Location/Drains/Wounds Patient Lines/Drains/Airways Status   Active Line/Drains/Airways    Name:   Placement date:   Placement time:   Site:   Days:   Peripheral IV 08/22/18 Left;Lateral Arm   08/22/18    1011    Arm   less than 1   Peripheral IV 08/22/18 Left;Upper Arm   08/22/18    1051    Arm   less than 1          Intake/Output Last 24 hours  Intake/Output Summary (Last 24 hours) at 08/22/2018 1234 Last data  filed at 08/22/2018 1126 Gross per 24 hour  Intake 1000 ml  Output -  Net 1000 ml    Labs/Imaging Results for orders placed or performed during the hospital encounter of 08/22/18 (from the past 48 hour(s))  CBG monitoring, ED     Status: Abnormal   Collection Time: 08/22/18  9:53 AM  Result Value Ref Range   Glucose-Capillary 508 (HH) 70 - 99 mg/dL   Comment 1 Notify RN   Basic metabolic panel     Status: Abnormal   Collection Time: 08/22/18 10:15 AM  Result Value Ref Range   Sodium 128 (L) 135 - 145 mmol/L    Comment: REPEATED TO VERIFY   Potassium 4.7 3.5 - 5.1 mmol/L   Chloride 91 (L) 98 - 111 mmol/L    Comment: REPEATED TO VERIFY   CO2 10 (L) 22 - 32 mmol/L    Comment: REPEATED TO VERIFY   Glucose, Bld 529 (HH) 70 - 99 mg/dL    Comment: CRITICAL RESULT CALLED TO, READ BACK BY AND VERIFIED WITH: DAVEY,A. RN AT 1048 08/22/18 MULLINS,T    BUN 30 (H) 6 - 20 mg/dL   Creatinine, Ser 8.83 0.61 - 1.24 mg/dL   Calcium 9.7 8.9 - 25.4 mg/dL   GFR calc non Af Amer >60 >60 mL/min   GFR calc  Af Amer >60 >60 mL/min   Anion gap 27 (H) 5 - 15    Comment: REPEATED TO VERIFY Performed at [facility], [address], [place], [place] [address]   Urinalysis, Routine w reflex microscopic     Status: Abnormal   Collection Time: 08/22/18 10:15 AM  Result Value Ref Range   Color, Urine STRAW (A) YELLOW   APPearance CLEAR CLEAR   Specific Gravity, Urine 1.028 1.005 - 1.030   pH 5.0 5.0 - 8.0   Glucose, UA >=500 (A) NEGATIVE mg/dL   Hgb urine dipstick NEGATIVE NEGATIVE   Bilirubin Urine NEGATIVE NEGATIVE   Ketones, ur 80 (A) NEGATIVE mg/dL   Protein, ur NEGATIVE NEGATIVE mg/dL   Nitrite NEGATIVE NEGATIVE   Leukocytes,Ua NEGATIVE NEGATIVE   RBC / HPF 0-5 0 - 5 RBC/hpf   WBC, UA 0-5 0 - 5 WBC/hpf   Bacteria, UA RARE (A) NONE SEEN   Squamous Epithelial / LPF 0-5 0 - 5    Comment: Performed at [facility] [address], [place], [place] [address]   CBC with Differential     Status: Abnormal   Collection Time: 08/22/18 10:15 AM  Result Value Ref Range   WBC 7.4 4.0 - 10.5 K/uL   RBC 5.16 4.22 - 5.81 MIL/uL   Hemoglobin 15.8 13.0 - 17.0 g/dL   HCT 64.3 83.8 - 18.4 %   MCV 90.7 80.0 - 100.0 fL   MCH 30.6 26.0 - 34.0 pg   MCHC 33.8 30.0 - 36.0 g/dL   RDW 03.7 54.3 - 60.6 %   Platelets 371 150 - 400 K/uL   nRBC 0.0 0.0 - 0.2 %   Neutrophils Relative % 89 %   Neutro Abs 6.5 1.7 - 7.7 K/uL   Lymphocytes Relative 9 %   Lymphs Abs 0.6 (L) 0.7 - 4.0 K/uL   Monocytes Relative 2 %   Monocytes Absolute 0.2 0.1 - 1.0 K/uL   Eosinophils Relative 0 %   Eosinophils Absolute 0.0 0.0 - 0.5 K/uL   Basophils Relative 0 %   Basophils Absolute 0.0 0.0 - 0.1 K/uL   Immature Granulocytes 0 %   Abs Immature Granulocytes 0.03 0.00 - 0.07 K/uL    Comment: Performed at [facility], [address], [place], [place] [address]  Blood gas, venous (at [facility] and [facility])     Status: Abnormal   Collection Time: 08/22/18 10:15 AM  Result Value Ref Range   pH, Ven 7.181 (LL) 7.250 - 7.430    Comment: CRITICAL RESULT CALLED TO, READ BACK BY AND VERIFIED WITH: MADDE Jossalin Chervenak BY BFRETWELL RRT ON 08/22/2018 AT 1031    pCO2, Ven 30.0 (L) 44.0 - 60.0 mmHg   pO2, Ven 46.0 (H) 32.0 - 45.0 mmHg   Bicarbonate 10.8 (L) 20.0 - 28.0 mmol/L   Acid-base deficit 16.7 (H) 0.0 - 2.0 mmol/L   O2 Saturation 74.0 %   Patient temperature 98.6     Comment: Performed at [facility], [address], [place], [place] [address]  Lactic acid, plasma     Status: None   Collection Time: 08/22/18 10:42 AM  Result Value Ref Range   Lactic Acid, Venous 1.2 0.5 - 1.9 mmol/L    Comment: Performed at [facility], [address], [place], [place] [address]  CBG monitoring, ED     Status: Abnormal   Collection Time: 08/22/18 11:21 AM  Result Value Ref Range  Glucose-Capillary 457 (H) 70 - 99 mg/dL  CBG monitoring, ED     Status:  Abnormal   Collection Time: 08/22/18 12:23 PM  Result Value Ref Range   Glucose-Capillary 343 (H) 70 - 99 mg/dL   Comment 1 Notify RN    Comment 2 Document in Chart    No results found.  Pending Labs Unresulted Labs (From admission, onward)    Start     Ordered   08/23/18 0500  Magnesium  Tomorrow morning,   R     08/22/18 1152   08/23/18 0500  Phosphorus  Tomorrow morning,   R     08/22/18 1218   08/23/18 0500  CBC  Tomorrow morning,   R     08/22/18 1219   08/22/18 1154  Hemoglobin A1c  Add-on,   R     08/22/18 1153   08/22/18 1152  Basic metabolic panel  Now then every 4 hours,   R     08/22/18 1152   08/22/18 1042  Lactic acid, plasma  Now then every 2 hours,   STAT     08/22/18 1041          Vitals/Pain Today's Vitals   08/22/18 1050 08/22/18 1100 08/22/18 1130 08/22/18 1200  BP:  (!) 139/93 (!) 138/99 (!) 150/105  Pulse:   (!) 120 (!) 112  Resp:  Temp: 98.7 F (37.1 C)     TempSrc: Rectal     SpO2:   100% 99%  Weight:      Height:      PainSc:        Isolation Precautions No active isolations  Medications Medications  insulin regular, human (MYXREDLIN) 100 units/ 100 mL infusion (4 Units/hr Intravenous New Bag/Given 08/22/18 1124)  sodium chloride 0.9 % bolus 1,000 mL (1,000 mLs Intravenous New Bag/Given 08/22/18 1116)    And  0.9 %  sodium chloride infusion ( Intravenous New Bag/Given 08/22/18 1124)  dextrose 5 %-0.45 % sodium chloride infusion (has no administration in time range)  potassium chloride 10 mEq in 100 mL IVPB (10 mEq Intravenous New Bag/Given 08/22/18 1117)  sodium bicarbonate 150 mEq in sterile water 1,000 mL infusion (has no administration in time range)  ondansetron (ZOFRAN) injection 4 mg (has no administration in time range)  acetaminophen (TYLENOL) tablet 650 mg (has no administration in time range)  enoxaparin (LOVENOX) injection 40 mg (has no administration in time range)  sodium chloride 0.9 % bolus 1,000 mL (0 mLs Intravenous  Stopped 08/22/18 1126)  ondansetron (ZOFRAN) injection 4 mg (4 mg Intravenous Given 08/22/18 1113)    Mobility walks Low fall risk   Focused Assessments Cardiac Assessment Handoff:    Lab Results  Component Value Date   CKTOTAL 60 11/30/2017   TROPONINI <0.03 11/30/2017   Lab Results  Component Value Date   DDIMER 0.86 (H) 04/24/2014   Does the Patient currently have chest pain? No     R Recommendations: See Admitting Provider Note  Report given to:   Additional Notes:  Pt in DKA

## 2018-08-22 NOTE — ED Notes (Signed)
[facility] called for transport. 

## 2018-08-22 NOTE — Progress Notes (Signed)
Basile Cdebaca [id number] Admission Data: 08/22/2018 4:10 PM Attending Provider: Darlin Drop, DO  [id number], Odette Horns, MD Consults/ Treatment Team:   Khamar Standard is a 33 y.o. male patient admitted from ED awake, alert  & orientated  X 4,  Full Code, VSS - Blood pressure (!) 153/109, pulse (!) 108, temperature 97.8 F (36.6 C), temperature source Oral, resp. rate 17, height 5\' 6"  (1.676 m), weight 51.3 kg, SpO2 100 %., O2 on RA, no c/o shortness of breath, no c/o chest pain, no distress noted. Tele # [id number] placed and pt is currently running:sinus tachycardia.  Allergies:  No Known Allergies   Past Medical History:  Diagnosis Date  . ARF (acute renal failure) ([facility]) 2011   pt was on dialysis "for 1 day approx 2011, pt is no longer on dialysis (03/09/2017)  . DKA (diabetic ketoacidoses) ([facility])    "several times" (03/09/2017)  . Type I diabetes mellitus ([facility]) Dx 2012    Pt orientation to unit, room and routine. Information packet given to patient/family and safety video watched.  Admission INP armband ID verified with patient/family, and in place. SR up x 2, fall risk assessment complete with Patient and family verbalizing understanding of risks associated with falls. Pt verbalizes an understanding of how to use the call bell and to call for help before getting out of bed.  Skin, clean-dry- intact without evidence of bruising, or skin tears.     Will cont to monitor and assist as needed.  Lyndal Pulley, RN 08/22/2018 4:10 PM

## 2018-08-22 NOTE — ED Notes (Signed)
Pt's pH is 7.181, PA made aware

## 2018-08-23 DIAGNOSIS — F1910 Other psychoactive substance abuse, uncomplicated: Secondary | ICD-10-CM

## 2018-08-23 DIAGNOSIS — E43 Unspecified severe protein-calorie malnutrition: Secondary | ICD-10-CM

## 2018-08-23 DIAGNOSIS — F4323 Adjustment disorder with mixed anxiety and depressed mood: Secondary | ICD-10-CM

## 2018-08-23 LAB — GLUCOSE, CAPILLARY
Glucose-Capillary: 153 mg/dL — ABNORMAL HIGH (ref 70–99)
Glucose-Capillary: 275 mg/dL — ABNORMAL HIGH (ref 70–99)
Glucose-Capillary: 133 mg/dL — ABNORMAL HIGH (ref 70–99)
Glucose-Capillary: 175 mg/dL — ABNORMAL HIGH (ref 70–99)
Glucose-Capillary: 196 mg/dL — ABNORMAL HIGH (ref 70–99)
Glucose-Capillary: 242 mg/dL — ABNORMAL HIGH (ref 70–99)
Glucose-Capillary: 206 mg/dL — ABNORMAL HIGH (ref 70–99)
Glucose-Capillary: 160 mg/dL — ABNORMAL HIGH (ref 70–99)
Glucose-Capillary: 173 mg/dL — ABNORMAL HIGH (ref 70–99)

## 2018-08-23 LAB — BASIC METABOLIC PANEL
Anion gap: 12 (ref 5–15)
Anion gap: 11 (ref 5–15)
Anion gap: 13 (ref 5–15)
BUN: 17 mg/dL (ref 6–20)
BUN: 14 mg/dL (ref 6–20)
BUN: 11 mg/dL (ref 6–20)
CO2: 17 mmol/L — ABNORMAL LOW (ref 22–32)
CO2: 19 mmol/L — ABNORMAL LOW (ref 22–32)
CO2: 21 mmol/L — ABNORMAL LOW (ref 22–32)
Calcium: 7.7 mg/dL — ABNORMAL LOW (ref 8.9–10.3)
Calcium: 7.6 mg/dL — ABNORMAL LOW (ref 8.9–10.3)
Calcium: 7.5 mg/dL — ABNORMAL LOW (ref 8.9–10.3)
Chloride: 103 mmol/L (ref 98–111)
Chloride: 101 mmol/L (ref 98–111)
Chloride: 100 mmol/L (ref 98–111)
Creatinine, Ser: 0.92 mg/dL (ref 0.61–1.24)
Creatinine, Ser: 0.76 mg/dL (ref 0.61–1.24)
Creatinine, Ser: 0.71 mg/dL (ref 0.61–1.24)
GFR calc Af Amer: >60 mL/min (ref >60)
GFR calc Af Amer: >60 mL/min (ref >60)
GFR calc Af Amer: >60 mL/min (ref >60)
GFR calc non Af Amer: >60 mL/min (ref >60)
GFR calc non Af Amer: >60 mL/min (ref >60)
GFR calc non Af Amer: >60 mL/min (ref >60)
Glucose, Bld: 148 mg/dL — ABNORMAL HIGH (ref 70–99)
Glucose, Bld: 159 mg/dL — ABNORMAL HIGH (ref 70–99)
Glucose, Bld: 179 mg/dL — ABNORMAL HIGH (ref 70–99)
Potassium: 3.6 mmol/L (ref 3.5–5.1)
Potassium: 3.1 mmol/L — ABNORMAL LOW (ref 3.5–5.1)
Potassium: 2.6 mmol/L — CL (ref 3.5–5.1)
Sodium: 132 mmol/L — ABNORMAL LOW (ref 135–145)
Sodium: 131 mmol/L — ABNORMAL LOW (ref 135–145)
Sodium: 134 mmol/L — ABNORMAL LOW (ref 135–145)

## 2018-08-23 LAB — CBC
HCT: 32.8 % — ABNORMAL LOW (ref 39.0–52.0)
Hemoglobin: 11.6 g/dL — ABNORMAL LOW (ref 13.0–17.0)
MCH: 30.9 pg (ref 26.0–34.0)
MCHC: 35.4 g/dL (ref 30.0–36.0)
MCV: 87.5 fL (ref 80.0–100.0)
Platelets: 242 10*3/uL (ref 150–400)
RBC: 3.75 MIL/uL — ABNORMAL LOW (ref 4.22–5.81)
RDW: 11.9 % (ref 11.5–15.5)
WBC: 5.6 10*3/uL (ref 4.0–10.5)
nRBC: 0 % (ref 0.0–0.2)

## 2018-08-23 LAB — PHOSPHORUS: Phosphorus: 1.3 mg/dL — ABNORMAL LOW (ref 2.5–4.6)

## 2018-08-23 LAB — MAGNESIUM: Magnesium: 1.8 mg/dL (ref 1.7–2.4)

## 2018-08-23 MED ORDER — POTASSIUM CHLORIDE CRYS ER 20 MEQ PO TBCR
40.0000 meq | EXTENDED_RELEASE_TABLET | Freq: Two times a day (BID) | ORAL | Status: AC
  Administered 2018-08-23 (×2): 40 meq via ORAL
  Filled 2018-08-23 (×2): qty 2

## 2018-08-23 MED ORDER — INSULIN GLARGINE 100 UNIT/ML ~~LOC~~ SOLN: 18.0000 [IU] | Freq: Two times a day (BID) | SUBCUTANEOUS | 11 refills | Status: DC

## 2018-08-23 MED ORDER — INSULIN GLARGINE 100 UNIT/ML ~~LOC~~ SOLN
18.0000 [IU] | Freq: Two times a day (BID) | SUBCUTANEOUS | Status: DC
  Administered 2018-08-23: 18 [IU] via SUBCUTANEOUS
  Filled 2018-08-23 (×3): qty 0.18

## 2018-08-23 NOTE — Progress Notes (Signed)
Inpatient Diabetes Program Recommendations  AACE/ADA: New Consensus Statement on Inpatient Glycemic Control (2015)  Target Ranges:  Prepandial:   less than 140 mg/dL      Peak postprandial:   less than 180 mg/dL (1-2 hours)      Critically ill patients:  140 - 180 mg/dL   Lab Results  Component Value Date   GLUCAP 160 (H) 08/23/2018   HGBA1C 12.8 (H) 08/22/2018    Review of Glycemic Control  Diabetes history: Type 2 Outpatient Diabetes medications: Lantus 18 units BID, Novolog 10 units TID Current orders for Inpatient glycemic control: IV insulin, Lantus 18 units BID at time of transition off IV insulin  Inpatient Diabetes Program Recommendations:   Patient has been seen frequently by our diabetes team. Last admission on 06/10/18.  Recommend giving Lantus 18 units 2 hours prior to transitioning off IV insulin and continuing as ordered.Recommend starting Novolog SENSITIVE (0-9 units) correction scale TID & HS scale at transition. If blood sugars are greater than 180 mg/dl, patient may need Novolog 3 units TID meal coverage if eating at least 50% of meal.   Will continue to follow while in the hospital.  Smith Mince RN BSN CDE Diabetes Coordinator Pager: [phone]  8am-5pm

## 2018-08-23 NOTE — Progress Notes (Signed)
Discharge instructions reviewed with pt. Pt has no questions at this time. IV d/c. Pt states he has enough insulin and lantus at home and he will be able to go to his follow up appointment on Monday.

## 2018-08-23 NOTE — Progress Notes (Signed)
Pt's K 2.6 Md on call made aware and new orders received. Pt adament about leaving spoke with MD and will not recheck K before pt leaves. Okay to d/c insulin drip per MD.

## 2018-08-23 NOTE — Progress Notes (Signed)
CRITICAL VALUE ALERT  Critical Value:  2.6  Date & Time Notied: 08/23/2018  Provider Notified: Rito Ehrlich  Orders Received/Actions taken: Po potassium ordered.

## 2018-08-23 NOTE — Discharge Summary (Addendum)
Discharge Summary  [name] [name] NFA:[id number] DOB: 03/07/1986  PCP: Hoy Register, MD  Admit date: 08/22/2018 Discharge date: 08/23/2018  Time spent: 25 minutes  Recommendations for Outpatient Follow-up:  1. Patient advised on days when he eats very little or misses meals, to take units of Lantus (patient previously had been skipping doses) 2. Patient [facility] the next few months  Discharge Diagnoses:  Active Hospital Problems   Diagnosis Date Noted  . DKA, type 1 (HCC) 03/09/2017  Diabetes mellitus type 1, uncontrolled Adjustment disorder with mixed anxiety and depressed mood Severe protein calorie malnutrition Substance abuse  Resolved Hospital Problems  No resolved problems to display.    Discharge Condition: Improved, being discharged to home  Diet recommendation: Carb modified  Vitals:   08/23/18 0819 08/23/18 1000  BP:  125/89  Pulse:    Resp:    Temp: 98.7 F (37.1 C)   SpO2:      History of present illness:  [name] [name] is a 33 y.o. male with medical history significant for type 1 diabetes, previous DKA secondary to noncompliance, who presented to the [facility] ED with complaints of generalized weakness, increase in urinary frequency and elevated blood sugar.  Patient states his symptoms are similar to previous DKA.  Denies running out of his medications.  States he was taking his insulin.  Denies fever, cough, or known exposure to sick contacts.  Patient works as a Solicitor at [employer] and unaware of any COVID-19 exposure.  No recent travel history.  He admits to intermittent dyspnea stating it feels like previous DKA.  No chest pain.  No worsening or improving factors.  Denies diarrhea or constipation.  Admits to vomiting once on Tuesday 2 days ago.  Has had polyuria and poor appetite.  ED Course: Upon presentation to the ED vital signs remarkable for tachycardia with rates in the 130s, tachypnea  with respiration rate in the 20s.  Lab studies remarkable for significant anion gap with metabolic acidosis and ketonuria noted on urine analysis.  TR[facility] asked to admit for DKA type I.  Hospital Course:  Active Problems:   DKA, type 1 (HCC) without coma in patient with type 1 diabetes mellitus, uncontrolled: Patient has had a number of hospitalizations reportedly for noncompliance.  Started on insulin drip and IV fluids.  By early morning of following day, CBG stable and anion gap resolved.  He was restarted on his Lantus and started on a p.o. diet.    In regards to his noncompliance: However, he tells me he has been consistently taking his insulin.  He does tell me however that he skips doses whenever he has no appetite and eats very little or misses a meal.  I advised him that in the future, should he miss a meal or eat very little due to appetite, to take approximately half the dose at 10 units instead of 18, as opposed to skipping the entire dose of Lantus that he is doing now.  He tells me he will do so.  Of note, patient's bicarb not yet fully back to baseline.  This was despite bicarbonate drip.  Patient was adamant about leaving today, saying that he had to get to his job and he threatened to leave AGAINST MEDICAL ADVICE.  He did recover much quicker than expected, and overall [place] appears to be stable, so we will go ahead and plan to formally discharge him  Hypokalemia: Secondary to elevated  blood sugars plus insulin.  2.6 prior to discharge and patient given several doses of K. Dur before he was discharged   Procedures:  None  Consultations:  None  Discharge Exam: BP 125/89   Pulse (!) 105   Temp 98.7 F (37.1 C) (Oral)   Resp 15   Ht 5\' 6"  (1.676 m)   Wt 47.8 kg   SpO2 100%   BMI 17.01 kg/m   General: Alert and oriented x3, no acute distress Cardiovascular: Rate and rhythm, S1-S2 Respiratory: Clear to auscultation bilaterally  Discharge Instructions You were cared for by  a hospitalist during your hospital stay. If you have any questions about your discharge medications or the care you received while you were in the hospital after you are discharged, you can call the unit and asked to speak with the hospitalist on call if the hospitalist that took care of you is not available. Once you are discharged, your primary care physician will handle any further medical issues. Please note that NO REFILLS for any discharge medications will be authorized once you are discharged, as it is imperative that you return to your primary care physician (or establish a relationship with a primary care physician if you do not have one) for your aftercare needs so that they can reassess your need for medications and monitor your lab values.  Discharge Instructions    Diet Carb Modified   Complete by:  As directed    Increase activity slowly   Complete by:  As directed      Allergies as of 08/23/2018   No Known Allergies     Medication List    STOP taking these medications   Insulin Glargine 100 UNIT/ML Solostar Pen Commonly known as:  LANTUS Replaced by:  insulin glargine 100 UNIT/ML injection     TAKE these medications   glucose blood test strip Commonly known as:  True Metrix Blood Glucose Test Use as instructed three times daily   insulin aspart 100 UNIT/ML injection Commonly known as:  novoLOG 3- 8 units subcutaneously before breakfast, lunch and dinner as per sliding scale What changed:    how much to take  how to take this  when to take this  additional instructions   insulin glargine 100 UNIT/ML injection Commonly known as:  LANTUS Inject 0.18 mLs (18 Units total) into the skin 2 (two) times daily. Replaces:  Insulin Glargine 100 UNIT/ML Solostar Pen   TRUEplus Lancets 28G Misc Use as directed three times daily      No Known Allergies Follow-up Information    Hoy Register, MD Follow up in 2 month(s).   Specialty:  Family Medicine Contact  information: [address] [place] [address] [phone]            The results of significant diagnostics from this hospitalization (including imaging, microbiology, ancillary and laboratory) are listed below for reference.    Significant Diagnostic Studies: No results found.  Microbiology: Recent Results (from the past 240 hour(s))  MRSA PCR Screening     Status: None   Collection Time: 08/22/18  2:57 PM  Result Value Ref Range Status   MRSA by PCR NEGATIVE NEGATIVE Final    Comment:        The GeneXpert MRSA Assay (FDA approved for NASAL specimens only), is one component of a comprehensive MRSA colonization surveillance program. It is not intended to diagnose MRSA infection nor to guide or monitor treatment for MRSA infections. Performed at [facility]  Lab, [address] [place], [place] [address]      Labs: Basic Metabolic Panel: Recent Labs  Lab 08/22/18 1015 08/22/18 2110 08/23/18 0121 08/23/18 0404 08/23/18 0943  NA 128* 134* 132* 131* 134*  K 4.7 3.4* 3.6 3.1* 2.6*  CL 91* 108 103 101 100  CO2 10* 18* 17* 19* 21*  GLUCOSE 529* 137* 148* 159* 179*  BUN 30* 18 17 14 11   CREATININE 1.18 0.90 0.92 0.76 0.71  CALCIUM 9.7 7.7* 7.7* 7.6* 7.5*  MG  --   --  1.8  --   --   PHOS  --   --  1.3*  --   --    Liver Function Tests: No results for input(s): AST, ALT, ALKPHOS, BILITOT, PROT, ALBUMIN in the last 168 hours. No results for input(s): LIPASE, AMYLASE in the last 168 hours. No results for input(s): AMMONIA in the last 168 hours. CBC: Recent Labs  Lab 08/22/18 1015 08/23/18 0121  WBC 7.4 5.6  NEUTROABS 6.5  --   HGB 15.8 11.6*  HCT 46.8 32.8*  MCV 90.7 87.5  PLT 371 242   Cardiac Enzymes: No results for input(s): CKTOTAL, CKMB, CKMBINDEX, TROPONINI in the last 168 hours. BNP: BNP (last 3 results) No results for input(s): BNP in the last 8760 hours.  ProBNP (last 3 results) No results for input(s): PROBNP in the last 8760  hours.  CBG: Recent Labs  Lab 08/23/18 0542 08/23/18 0701 08/23/18 0805 08/23/18 0910 08/23/18 1002  GLUCAP 196* 242* 206* 160* 173*       Signed:  Hollice Espy, MD [facility] 08/23/2018, 11:31 AM

## 2018-08-23 NOTE — Discharge Instructions (Signed)

## 2018-08-26 ENCOUNTER — Other Ambulatory Visit: Payer: Self-pay

## 2018-08-26 ENCOUNTER — Ambulatory Visit: Payer: Self-pay | Attending: Family Medicine | Admitting: Family Medicine

## 2018-08-26 ENCOUNTER — Encounter: Payer: Self-pay | Admitting: Family Medicine

## 2018-08-26 VITALS — BP 114/75 | HR 100 | Temp 98.1°F | Ht 66.0 in | Wt 117.6 lb

## 2018-08-26 DIAGNOSIS — E1069 Type 1 diabetes mellitus with other specified complication: Secondary | ICD-10-CM

## 2018-08-26 DIAGNOSIS — E876 Hypokalemia: Secondary | ICD-10-CM

## 2018-08-26 DIAGNOSIS — E1065 Type 1 diabetes mellitus with hyperglycemia: Secondary | ICD-10-CM

## 2018-08-26 LAB — GLUCOSE, POCT (MANUAL RESULT ENTRY): POC Glucose: 116 mg/dl — AB (ref 70–99)

## 2018-08-26 MED ORDER — INSULIN ASPART 100 UNIT/ML ~~LOC~~ SOLN: 10.0000 [IU] | Freq: Three times a day (TID) | SUBCUTANEOUS | 3 refills | Status: AC

## 2018-08-26 NOTE — Progress Notes (Signed)
Subjective:  Patient ID: [name] [name], male    DOB: 01-16-1986  Age: 33 y.o. MRN: [id number]  CC: Hospitalization Follow-up   HPI [name] [name] is a 33 year old male with a history of type 1 diabetes mellitus (A1c 12.8) who presents today for follow-up visit at the transitional care clinic after hospitalization for DKA at [facility] from 08/22/18- 08/23/18. He was treated with IV fluids and IV insulin and electrolytes were corrected with bicarb drip and potassium but discharge potassium was 2.6 as he insisted on leaving to get back to his job.  He presents today reporting compliance with his medications but endorses skipping his Lantus when he has a poor appetite to prevent hypoglycemia.  On further questioning he is unable to give me any hypoglycemic readings but endorses fluctuations of his sugar from the low 100s to the high 300s. He denies chest pain, shortness of breath, abdominal pain, nausea or vomiting at this time and his blood sugar is 116 in the clinic. He has no additional concerns at this time.  Past Medical History:  Diagnosis Date  . ARF (acute renal failure) (HCC) 2011   pt was on dialysis "for 1 day approx 2011, pt is no longer on dialysis (03/09/2017)  . DKA (diabetic ketoacidoses) (HCC)    "several times" (03/09/2017)  . Type I diabetes mellitus (HCC) Dx 2012    Past Surgical History:  Procedure Laterality Date  . NO PAST SURGERIES      Family History  Problem Relation Age of Onset  . Diabetes Father   . Diabetes Maternal Grandmother   . Heart disease Neg Hx   . Hypertension Neg Hx     No Known Allergies  Outpatient Medications Prior to Visit  Medication Sig Dispense Refill  . glucose blood (TRUE METRIX BLOOD GLUCOSE TEST) test strip Use as instructed three times daily 100 each 1  . insulin glargine (LANTUS) 100 UNIT/ML injection Inject 0.18 mLs (18 Units total) into the skin 2 (two) times daily. 10 mL 11  . TRUEPLUS LANCETS 28G MISC Use as  directed three times daily 100 each 1  . insulin aspart (NOVOLOG) 100 UNIT/ML injection 3- 8 units subcutaneously before breakfast, lunch and dinner as per sliding scale (Patient taking differently: Inject 10 Units into the skin 3 (three) times daily with meals. ) 30 mL 3   No facility-administered medications prior to visit.      ROS Review of Systems  Constitutional: Negative for activity change and appetite change.  HENT: Negative for sinus pressure and sore throat.   Eyes: Negative for visual disturbance.  Respiratory: Negative for cough, chest tightness and shortness of breath.   Cardiovascular: Negative for chest pain and leg swelling.  Gastrointestinal: Negative for abdominal distention, abdominal pain, constipation and diarrhea.  Endocrine: Negative.   Genitourinary: Negative for dysuria.  Musculoskeletal: Negative for joint swelling and myalgias.  Skin: Negative for rash.  Allergic/Immunologic: Negative.   Neurological: Negative for weakness, light-headedness and numbness.  Psychiatric/Behavioral: Negative for dysphoric mood and suicidal ideas.    Objective:  BP 114/75   Pulse 100   Temp 98.1 F (36.7 C) (Oral)   Ht 5\' 6"  (1.676 m)   Wt 117 lb 9.6 oz (53.3 kg)   SpO2 100%   BMI 18.98 kg/m   BP/Weight 08/26/2018 08/23/2018 06/17/2018  Systolic BP 114 125 136  Diastolic BP 75 89 88  Wt. (Lbs) 117.6 105.38 123.4  BMI 18.98 17.01 19.92      Physical Exam  Constitutional:      Appearance: He is well-developed.     Comments: Thin looking  Cardiovascular:     Rate and Rhythm: Normal rate.     Heart sounds: Normal heart sounds. No murmur.  Pulmonary:     Effort: Pulmonary effort is normal.     Breath sounds: Normal breath sounds. No wheezing or rales.  Chest:     Chest wall: No tenderness.  Abdominal:     General: Bowel sounds are normal. There is no distension.     Palpations: Abdomen is soft. There is no mass.     Tenderness: There is no abdominal tenderness.   Musculoskeletal: Normal range of motion.  Neurological:     Mental Status: He is alert and oriented to person, place, and time.     CMP Latest Ref Rng & Units 08/23/2018 08/23/2018 08/23/2018  Glucose 70 - 99 mg/dL 110(R) 159(Y) 585(F)  BUN 6 - 20 mg/dL 11 14 17   Creatinine 0.61 - 1.24 mg/dL 2.92 4.46 2.86  Sodium 135 - 145 mmol/L 134(L) 131(L) 132(L)  Potassium 3.5 - 5.1 mmol/L 2.6(LL) 3.1(L) 3.6  Chloride 98 - 111 mmol/L 100 101 103  CO2 22 - 32 mmol/L 21(L) 19(L) 17(L)  Calcium 8.9 - 10.3 mg/dL 7.5(L) 7.6(L) 7.7(L)  Total Protein 6.5 - 8.1 g/dL - - -  Total Bilirubin 0.3 - 1.2 mg/dL - - -  Alkaline Phos 38 - 126 U/L - - -  AST 15 - 41 U/L - - -  ALT 0 - 44 U/L - - -    Lipid Panel     Component Value Date/Time   CHOL 193 08/23/2017 0414   TRIG 784 (H) 04/15/2018 2319   HDL 59 08/23/2017 0414   CHOLHDL 3.3 08/23/2017 0414   VLDL 19 08/23/2017 0414   LDLCALC 115 (H) 08/23/2017 0414   LDLDIRECT 127 (H) 07/22/2015 2030    CBC    Component Value Date/Time   WBC 5.6 08/23/2018 0121   RBC 3.75 (L) 08/23/2018 0121   HGB 11.6 (L) 08/23/2018 0121   HCT 32.8 (L) 08/23/2018 0121   PLT 242 08/23/2018 0121   MCV 87.5 08/23/2018 0121   MCH 30.9 08/23/2018 0121   MCHC 35.4 08/23/2018 0121   RDW 11.9 08/23/2018 0121   LYMPHSABS 0.6 (L) 08/22/2018 1015   MONOABS 0.2 08/22/2018 1015   EOSABS 0.0 08/22/2018 1015   BASOSABS 0.0 08/22/2018 1015    Lab Results  Component Value Date   HGBA1C 12.8 (H) 08/22/2018    Assessment & Plan:   1. Uncontrolled type 1 diabetes mellitus with hyperglycemia (HCC) Uncontrolled with A1c of 12.8 He has been skipping his Lantus due to his work schedule and eating habits Encouraged compliance with medications and educated on the difference between actions of long-acting Lantus and short acting NovoLog He will attempt to take his lunch and dinner with him to work with him to promote consistent meals. - POCT glucose (manual entry) - Basic Metabolic  Panel  2. Type 1 diabetes mellitus with other specified complication (HCC) See #1 above - insulin aspart (NOVOLOG) 100 UNIT/ML injection; Inject 10 Units into the skin 3 (three) times daily with meals.  Dispense: 30 mL; Refill: 3  3. Hypokalemia Last potassium was 2.6 We will check again   Meds ordered this encounter  Medications  . insulin aspart (NOVOLOG) 100 UNIT/ML injection    Sig: Inject 10 Units into the skin 3 (three) times daily with meals.  Dispense:  30 mL    Refill:  3    Follow-up: Return in about 3 months (around 11/25/2018) for Follow-up of chronic medical conditions.       Hoy Register, MD, FAAFP. [facility] [place], [place] [phone]   08/26/2018, 4:59 PM

## 2018-08-26 NOTE — Patient Instructions (Signed)
Diabetes Mellitus and Nutrition, Adult  When you have diabetes (diabetes mellitus), it is very important to have healthy eating habits because your blood sugar (glucose) levels are greatly affected by what you eat and drink. Eating healthy foods in the appropriate amounts, at about the same times every day, can help you:  · Control your blood glucose.  · Lower your risk of heart disease.  · Improve your blood pressure.  · Reach or maintain a healthy weight.  Every person with diabetes is different, and each person has different needs for a meal plan. Your health care provider may recommend that you work with a diet and nutrition specialist (dietitian) to make a meal plan that is best for you. Your meal plan may vary depending on factors such as:  · The calories you need.  · The medicines you take.  · Your weight.  · Your blood glucose, blood pressure, and cholesterol levels.  · Your activity level.  · Other health conditions you have, such as heart or kidney disease.  How do carbohydrates affect me?  Carbohydrates, also called carbs, affect your blood glucose level more than any other type of food. Eating carbs naturally raises the amount of glucose in your blood. Carb counting is a method for keeping track of how many carbs you eat. Counting carbs is important to keep your blood glucose at a healthy level, especially if you use insulin or take certain oral diabetes medicines.  It is important to know how many carbs you can safely have in each meal. This is different for every person. Your dietitian can help you calculate how many carbs you should have at each meal and for each snack.  Foods that contain carbs include:  · Bread, cereal, rice, pasta, and crackers.  · Potatoes and corn.  · Peas, beans, and lentils.  · Milk and yogurt.  · Fruit and juice.  · Desserts, such as cakes, cookies, ice cream, and candy.  How does alcohol affect me?  Alcohol can cause a sudden decrease in blood glucose (hypoglycemia),  especially if you use insulin or take certain oral diabetes medicines. Hypoglycemia can be a life-threatening condition. Symptoms of hypoglycemia (sleepiness, dizziness, and confusion) are similar to symptoms of having too much alcohol.  If your health care provider says that alcohol is safe for you, follow these guidelines:  · Limit alcohol intake to no more than 1 drink per day for nonpregnant women and 2 drinks per day for men. One drink equals 12 oz of beer, 5 oz of wine, or 1½ oz of hard liquor.  · Do not drink on an empty stomach.  · Keep yourself hydrated with water, diet soda, or unsweetened iced tea.  · Keep in mind that regular soda, juice, and other mixers may contain a lot of sugar and must be counted as carbs.  What are tips for following this plan?    Reading food labels  · Start by checking the serving size on the "Nutrition Facts" label of packaged foods and drinks. The amount of calories, carbs, fats, and other nutrients listed on the label is based on one serving of the item. Many items contain more than one serving per package.  · Check the total grams (g) of carbs in one serving. You can calculate the number of servings of carbs in one serving by dividing the total carbs by 15. For example, if a food has 30 g of total carbs, it would be equal to 2   servings of carbs.  · Check the number of grams (g) of saturated and trans fats in one serving. Choose foods that have low or no amount of these fats.  · Check the number of milligrams (mg) of salt (sodium) in one serving. Most people should limit total sodium intake to less than 2,300 mg per day.  · Always check the nutrition information of foods labeled as "low-fat" or "nonfat". These foods may be higher in added sugar or refined carbs and should be avoided.  · Talk to your dietitian to identify your daily goals for nutrients listed on the label.  Shopping  · Avoid buying canned, premade, or processed foods. These foods tend to be high in fat, sodium,  and added sugar.  · Shop around the outside edge of the grocery store. This includes fresh fruits and vegetables, bulk grains, fresh meats, and fresh dairy.  Cooking  · Use low-heat cooking methods, such as baking, instead of high-heat cooking methods like deep frying.  · Cook using healthy oils, such as olive, canola, or sunflower oil.  · Avoid cooking with butter, cream, or high-fat meats.  Meal planning  · Eat meals and snacks regularly, preferably at the same times every day. Avoid going long periods of time without eating.  · Eat foods high in fiber, such as fresh fruits, vegetables, beans, and whole grains. Talk to your dietitian about how many servings of carbs you can eat at each meal.  · Eat 4-6 ounces (oz) of lean protein each day, such as lean meat, chicken, fish, eggs, or tofu. One oz of lean protein is equal to:  ? 1 oz of meat, chicken, or fish.  ? 1 egg.  ? ¼ cup of tofu.  · Eat some foods each day that contain healthy fats, such as avocado, nuts, seeds, and fish.  Lifestyle  · Check your blood glucose regularly.  · Exercise regularly as told by your health care provider. This may include:  ? 150 minutes of moderate-intensity or vigorous-intensity exercise each week. This could be brisk walking, biking, or water aerobics.  ? Stretching and doing strength exercises, such as yoga or weightlifting, at least 2 times a week.  · Take medicines as told by your health care provider.  · Do not use any products that contain nicotine or tobacco, such as cigarettes and e-cigarettes. If you need help quitting, ask your health care provider.  · Work with a counselor or diabetes educator to identify strategies to manage stress and any emotional and social challenges.  Questions to ask a health care provider  · Do I need to meet with a diabetes educator?  · Do I need to meet with a dietitian?  · What number can I call if I have questions?  · When are the best times to check my blood glucose?  Where to find more  information:  · American Diabetes Association: diabetes.org  · Academy of Nutrition and Dietetics: www.eatright.org  · National Institute of Diabetes and Digestive and Kidney Diseases (NIH): www.niddk.nih.gov  Summary  · A healthy meal plan will help you control your blood glucose and maintain a healthy lifestyle.  · Working with a diet and nutrition specialist (dietitian) can help you make a meal plan that is best for you.  · Keep in mind that carbohydrates (carbs) and alcohol have immediate effects on your blood glucose levels. It is important to count carbs and to use alcohol carefully.  This information is not intended to   replace advice given to you by your health care provider. Make sure you discuss any questions you have with your health care provider.  Document Released: 02/02/2005 Document Revised: 12/06/2016 Document Reviewed: 06/12/2016  Elsevier Interactive Patient Education © 2019 [employer]

## 2018-08-27 LAB — BASIC METABOLIC PANEL
BUN/Creatinine Ratio: 22 — ABNORMAL HIGH (ref 9–20)
BUN: 15 mg/dL (ref 6–20)
CO2: 29 mmol/L (ref 20–29)
Calcium: 9.1 mg/dL (ref 8.7–10.2)
Chloride: 102 mmol/L (ref 96–106)
Creatinine, Ser: 0.69 mg/dL — ABNORMAL LOW (ref 0.76–1.27)
GFR calc Af Amer: 143 mL/min/{1.73_m2} (ref >59)
GFR calc non Af Amer: 124 mL/min/{1.73_m2} (ref >59)
Glucose: 152 mg/dL — ABNORMAL HIGH (ref 65–99)
Potassium: 3.4 mmol/L — ABNORMAL LOW (ref 3.5–5.2)
Sodium: 142 mmol/L (ref 134–144)

## 2018-10-07 MED FILL — NovoLOG 100 UNIT/ML SOLN: 100 | 28 days supply | Qty: 10 | Fill #0

## 2018-10-07 MED FILL — LANTUS SOLOSTAR 100 UNITS/M: 100 | 30 days supply | Qty: 6 | Fill #0

## 2018-11-25 ENCOUNTER — Other Ambulatory Visit: Payer: Self-pay

## 2018-11-25 MED FILL — !NOVOLOG 100UNITS/ML VIAL: 100/ML | 28 days supply | Qty: 10 | Fill #1

## 2018-11-26 MED ORDER — INSULIN GLARGINE 100 UNIT/ML ~~LOC~~ SOLN: 18.0000 [IU] | Freq: Two times a day (BID) | SUBCUTANEOUS | 3 refills | Status: AC

## 2018-11-27 ENCOUNTER — Ambulatory Visit: Payer: Self-pay | Admitting: Family Medicine

## 2018-11-27 MED FILL — !LANTUS 100 UNITS/ML VIAL: 100 | 27 days supply | Qty: 10 | Fill #0

## 2018-12-04 ENCOUNTER — Ambulatory Visit: Payer: Self-pay | Admitting: Family Medicine

## 2019-05-23 DEATH — deceased

## 2020-08-24 ENCOUNTER — Other Ambulatory Visit: Payer: Self-pay
